# Patient Record
Sex: Male | Born: 1945 | Race: Black or African American | Hispanic: No | Marital: Married | Smoking: Current some day smoker
Health system: Southern US, Community
[De-identification: ages and names within clinical notes are randomized; demographics above are authoritative.]

## PROBLEM LIST (undated history)

## (undated) DIAGNOSIS — E119 Type 2 diabetes mellitus without complications: Secondary | ICD-10-CM

## (undated) DIAGNOSIS — E66811 Obesity, class 1: Secondary | ICD-10-CM

## (undated) DIAGNOSIS — I509 Heart failure, unspecified: Secondary | ICD-10-CM

## (undated) DIAGNOSIS — K5900 Constipation, unspecified: Secondary | ICD-10-CM

## (undated) DIAGNOSIS — G4730 Sleep apnea, unspecified: Secondary | ICD-10-CM

## (undated) DIAGNOSIS — E669 Obesity, unspecified: Secondary | ICD-10-CM

## (undated) DIAGNOSIS — I350 Nonrheumatic aortic (valve) stenosis: Secondary | ICD-10-CM

## (undated) DIAGNOSIS — Z720 Tobacco use: Secondary | ICD-10-CM

## (undated) DIAGNOSIS — K635 Polyp of colon: Secondary | ICD-10-CM

## (undated) DIAGNOSIS — M109 Gout, unspecified: Secondary | ICD-10-CM

## (undated) DIAGNOSIS — R7989 Other specified abnormal findings of blood chemistry: Secondary | ICD-10-CM

## (undated) DIAGNOSIS — N184 Chronic kidney disease, stage 4 (severe): Secondary | ICD-10-CM

## (undated) DIAGNOSIS — I5042 Chronic combined systolic (congestive) and diastolic (congestive) heart failure: Secondary | ICD-10-CM

## (undated) DIAGNOSIS — I10 Essential (primary) hypertension: Secondary | ICD-10-CM

## (undated) DIAGNOSIS — E213 Hyperparathyroidism, unspecified: Secondary | ICD-10-CM

## (undated) DIAGNOSIS — Z95810 Presence of automatic (implantable) cardiac defibrillator: Secondary | ICD-10-CM

## (undated) DIAGNOSIS — M1990 Unspecified osteoarthritis, unspecified site: Secondary | ICD-10-CM

## (undated) DIAGNOSIS — N400 Enlarged prostate without lower urinary tract symptoms: Secondary | ICD-10-CM

## (undated) DIAGNOSIS — E785 Hyperlipidemia, unspecified: Secondary | ICD-10-CM

## (undated) DIAGNOSIS — A048 Other specified bacterial intestinal infections: Secondary | ICD-10-CM

## (undated) DIAGNOSIS — I428 Other cardiomyopathies: Secondary | ICD-10-CM

## (undated) DIAGNOSIS — IMO0001 Reserved for inherently not codable concepts without codable children: Secondary | ICD-10-CM

## (undated) DIAGNOSIS — D126 Benign neoplasm of colon, unspecified: Secondary | ICD-10-CM

## (undated) DIAGNOSIS — N529 Male erectile dysfunction, unspecified: Secondary | ICD-10-CM

## (undated) HISTORY — DX: Obesity, class 1: E66.811

## (undated) HISTORY — DX: Type 2 diabetes mellitus without complications: E11.9

## (undated) HISTORY — DX: Benign prostatic hyperplasia without lower urinary tract symptoms: N40.0

## (undated) HISTORY — DX: Obesity, unspecified: E66.9

## (undated) HISTORY — DX: Polyp of colon: K63.5

## (undated) HISTORY — DX: Hyperparathyroidism, unspecified: E21.3

## (undated) HISTORY — DX: Male erectile dysfunction, unspecified: N52.9

## (undated) HISTORY — DX: Hyperlipidemia, unspecified: E78.5

## (undated) HISTORY — DX: Tobacco use: Z72.0

## (undated) HISTORY — DX: Benign neoplasm of colon, unspecified: D12.6

## (undated) HISTORY — DX: Other specified bacterial intestinal infections: A04.8

## (undated) HISTORY — PX: CARDIAC CATHETERIZATION: SHX172

## (undated) HISTORY — DX: Sleep apnea, unspecified: G47.30

## (undated) HISTORY — DX: Chronic kidney disease, stage 4 (severe): N18.4

## (undated) HISTORY — PX: RETINAL LASER PROCEDURE: SHX2339

## (undated) HISTORY — DX: Other specified abnormal findings of blood chemistry: R79.89

## (undated) HISTORY — PX: LYMPH NODE BIOPSY: SHX201

## (undated) HISTORY — DX: Essential (primary) hypertension: I10

---

## 1998-08-24 ENCOUNTER — Encounter: Payer: Self-pay | Admitting: Emergency Medicine

## 1998-08-24 ENCOUNTER — Inpatient Hospital Stay (HOSPITAL_COMMUNITY): Admission: EM | Admit: 1998-08-24 | Discharge: 1998-08-26 | Payer: Self-pay | Admitting: Emergency Medicine

## 2001-01-01 ENCOUNTER — Ambulatory Visit (HOSPITAL_COMMUNITY): Admission: RE | Admit: 2001-01-01 | Discharge: 2001-01-01 | Payer: Self-pay | Admitting: Internal Medicine

## 2001-07-29 ENCOUNTER — Emergency Department (HOSPITAL_COMMUNITY): Admission: EM | Admit: 2001-07-29 | Discharge: 2001-07-29 | Payer: Self-pay | Admitting: Emergency Medicine

## 2001-07-31 ENCOUNTER — Encounter: Payer: Self-pay | Admitting: Internal Medicine

## 2001-07-31 ENCOUNTER — Ambulatory Visit (HOSPITAL_COMMUNITY): Admission: RE | Admit: 2001-07-31 | Discharge: 2001-07-31 | Payer: Self-pay | Admitting: Internal Medicine

## 2004-01-18 DIAGNOSIS — D126 Benign neoplasm of colon, unspecified: Secondary | ICD-10-CM

## 2004-01-18 HISTORY — DX: Benign neoplasm of colon, unspecified: D12.6

## 2004-08-23 ENCOUNTER — Encounter: Payer: Self-pay | Admitting: Internal Medicine

## 2004-08-23 ENCOUNTER — Ambulatory Visit: Payer: Self-pay | Admitting: Internal Medicine

## 2004-08-23 ENCOUNTER — Ambulatory Visit (HOSPITAL_COMMUNITY): Admission: RE | Admit: 2004-08-23 | Discharge: 2004-08-23 | Payer: Self-pay | Admitting: Internal Medicine

## 2004-08-23 HISTORY — PX: COLONOSCOPY: SHX174

## 2004-08-23 LAB — HM COLONOSCOPY

## 2007-02-23 ENCOUNTER — Ambulatory Visit: Payer: Self-pay | Admitting: Internal Medicine

## 2007-02-23 DIAGNOSIS — E11319 Type 2 diabetes mellitus with unspecified diabetic retinopathy without macular edema: Secondary | ICD-10-CM | POA: Insufficient documentation

## 2007-02-23 DIAGNOSIS — I10 Essential (primary) hypertension: Secondary | ICD-10-CM | POA: Insufficient documentation

## 2007-02-23 DIAGNOSIS — F17200 Nicotine dependence, unspecified, uncomplicated: Secondary | ICD-10-CM | POA: Insufficient documentation

## 2007-02-24 ENCOUNTER — Encounter (INDEPENDENT_AMBULATORY_CARE_PROVIDER_SITE_OTHER): Payer: Self-pay | Admitting: Internal Medicine

## 2007-02-25 LAB — CONVERTED CEMR LAB
ALT: 14 units/L (ref 0–53)
BUN: 23 mg/dL (ref 6–23)
Basophils Relative: 1 % (ref 0–1)
Calcium: 9.7 mg/dL (ref 8.4–10.5)
Eosinophils Relative: 4 % (ref 0–5)
HCT: 40.7 % (ref 39.0–52.0)
MCHC: 33.4 g/dL (ref 30.0–36.0)
MCV: 89.3 fL (ref 78.0–100.0)
Neutro Abs: 4.4 10*3/uL (ref 1.7–7.7)
Potassium: 3.4 meq/L — ABNORMAL LOW (ref 3.5–5.3)
RBC: 4.56 M/uL (ref 4.22–5.81)
RDW: 14 % (ref 11.5–15.5)
Sodium: 140 meq/L (ref 135–145)
Testosterone: 328.11 ng/dL — ABNORMAL LOW (ref 350–890)
Total Protein: 7.1 g/dL (ref 6.0–8.3)
Triglycerides: 67 mg/dL (ref <150)

## 2007-02-26 ENCOUNTER — Telehealth (INDEPENDENT_AMBULATORY_CARE_PROVIDER_SITE_OTHER): Payer: Self-pay | Admitting: *Deleted

## 2007-02-26 ENCOUNTER — Encounter (INDEPENDENT_AMBULATORY_CARE_PROVIDER_SITE_OTHER): Payer: Self-pay | Admitting: Internal Medicine

## 2007-02-27 ENCOUNTER — Encounter (INDEPENDENT_AMBULATORY_CARE_PROVIDER_SITE_OTHER): Payer: Self-pay | Admitting: Internal Medicine

## 2007-02-28 ENCOUNTER — Ambulatory Visit: Payer: Self-pay | Admitting: Internal Medicine

## 2007-04-20 ENCOUNTER — Encounter (INDEPENDENT_AMBULATORY_CARE_PROVIDER_SITE_OTHER): Payer: Self-pay | Admitting: Internal Medicine

## 2007-04-20 ENCOUNTER — Ambulatory Visit: Payer: Self-pay | Admitting: Family Medicine

## 2007-04-20 LAB — CONVERTED CEMR LAB
Blood Glucose, Fingerstick: 130
Hgb A1c MFr Bld: 6.4 %

## 2007-04-30 LAB — CONVERTED CEMR LAB
Creatinine, Urine: 201.9 mg/dL
Microalb Creat Ratio: 117.9 mg/g — ABNORMAL HIGH (ref 0.0–30.0)
Microalb, Ur: 23.8 mg/dL — ABNORMAL HIGH (ref 0.00–1.89)

## 2007-05-28 ENCOUNTER — Ambulatory Visit: Payer: Self-pay | Admitting: Internal Medicine

## 2007-07-23 ENCOUNTER — Ambulatory Visit: Payer: Self-pay | Admitting: Internal Medicine

## 2007-07-25 ENCOUNTER — Telehealth (INDEPENDENT_AMBULATORY_CARE_PROVIDER_SITE_OTHER): Payer: Self-pay | Admitting: *Deleted

## 2007-07-26 ENCOUNTER — Encounter (INDEPENDENT_AMBULATORY_CARE_PROVIDER_SITE_OTHER): Payer: Self-pay | Admitting: Internal Medicine

## 2007-08-17 ENCOUNTER — Ambulatory Visit: Payer: Self-pay | Admitting: Internal Medicine

## 2007-08-20 LAB — CONVERTED CEMR LAB
ALT: 11 units/L (ref 0–53)
AST: 11 units/L (ref 0–37)
Alkaline Phosphatase: 62 units/L (ref 39–117)
BUN: 31 mg/dL — ABNORMAL HIGH (ref 6–23)
Basophils Absolute: 0.1 10*3/uL (ref 0.0–0.1)
Basophils Relative: 1 % (ref 0–1)
CO2: 19 meq/L (ref 19–32)
Creatinine, Ser: 2.2 mg/dL — ABNORMAL HIGH (ref 0.40–1.50)
Eosinophils Relative: 4 % (ref 0–5)
HCT: 40.5 % (ref 39.0–52.0)
Lymphocytes Relative: 27 % (ref 12–46)
Lymphs Abs: 2.1 10*3/uL (ref 0.7–4.0)
MCV: 90.4 fL (ref 78.0–100.0)
Neutro Abs: 4.6 10*3/uL (ref 1.7–7.7)
PSA: 0.41 ng/mL (ref 0.10–4.00)
Platelets: 252 10*3/uL (ref 150–400)
RDW: 13.7 % (ref 11.5–15.5)
Total Protein: 7.1 g/dL (ref 6.0–8.3)
VLDL: 17 mg/dL (ref 0–40)

## 2007-08-22 ENCOUNTER — Encounter (INDEPENDENT_AMBULATORY_CARE_PROVIDER_SITE_OTHER): Payer: Self-pay | Admitting: Internal Medicine

## 2007-08-24 LAB — CONVERTED CEMR LAB
CO2: 22 meq/L (ref 19–32)
Calcium: 9.1 mg/dL (ref 8.4–10.5)
Chloride: 104 meq/L (ref 96–112)
Glucose, Bld: 170 mg/dL — ABNORMAL HIGH (ref 70–99)
Sodium: 142 meq/L (ref 135–145)

## 2007-08-29 ENCOUNTER — Telehealth (INDEPENDENT_AMBULATORY_CARE_PROVIDER_SITE_OTHER): Payer: Self-pay | Admitting: Internal Medicine

## 2007-08-29 ENCOUNTER — Ambulatory Visit (HOSPITAL_COMMUNITY): Admission: RE | Admit: 2007-08-29 | Discharge: 2007-08-29 | Payer: Self-pay | Admitting: Internal Medicine

## 2007-09-06 ENCOUNTER — Ambulatory Visit: Payer: Self-pay | Admitting: Internal Medicine

## 2007-09-13 ENCOUNTER — Telehealth (INDEPENDENT_AMBULATORY_CARE_PROVIDER_SITE_OTHER): Payer: Self-pay | Admitting: Internal Medicine

## 2007-09-18 ENCOUNTER — Encounter (INDEPENDENT_AMBULATORY_CARE_PROVIDER_SITE_OTHER): Payer: Self-pay | Admitting: Internal Medicine

## 2007-10-04 ENCOUNTER — Ambulatory Visit: Payer: Self-pay | Admitting: Internal Medicine

## 2007-10-05 ENCOUNTER — Telehealth (INDEPENDENT_AMBULATORY_CARE_PROVIDER_SITE_OTHER): Payer: Self-pay | Admitting: *Deleted

## 2007-10-05 ENCOUNTER — Encounter (INDEPENDENT_AMBULATORY_CARE_PROVIDER_SITE_OTHER): Payer: Self-pay | Admitting: Internal Medicine

## 2007-10-09 ENCOUNTER — Encounter (INDEPENDENT_AMBULATORY_CARE_PROVIDER_SITE_OTHER): Payer: Self-pay | Admitting: Internal Medicine

## 2007-10-12 LAB — CONVERTED CEMR LAB
PSA: 0.37 ng/mL (ref 0.10–4.00)
Potassium: 3.8 meq/L (ref 3.5–5.3)

## 2007-10-15 ENCOUNTER — Ambulatory Visit: Payer: Self-pay | Admitting: Internal Medicine

## 2007-10-19 LAB — CONVERTED CEMR LAB
Chloride: 101 meq/L (ref 96–112)
Potassium: 3.8 meq/L (ref 3.5–5.3)

## 2007-11-01 ENCOUNTER — Encounter (INDEPENDENT_AMBULATORY_CARE_PROVIDER_SITE_OTHER): Payer: Self-pay | Admitting: Internal Medicine

## 2007-11-20 ENCOUNTER — Ambulatory Visit: Payer: Self-pay | Admitting: Internal Medicine

## 2007-11-27 ENCOUNTER — Encounter (INDEPENDENT_AMBULATORY_CARE_PROVIDER_SITE_OTHER): Payer: Self-pay | Admitting: Internal Medicine

## 2007-12-05 ENCOUNTER — Telehealth (INDEPENDENT_AMBULATORY_CARE_PROVIDER_SITE_OTHER): Payer: Self-pay | Admitting: *Deleted

## 2007-12-06 LAB — CONVERTED CEMR LAB
Albumin: 4 g/dL (ref 3.5–5.2)
BUN: 28 mg/dL — ABNORMAL HIGH (ref 6–23)
CO2: 21 meq/L (ref 19–32)
Calcium: 9.4 mg/dL (ref 8.4–10.5)
Chloride: 103 meq/L (ref 96–112)
Creatinine, Ser: 2.05 mg/dL — ABNORMAL HIGH (ref 0.40–1.50)
Phosphorus: 3.4 mg/dL (ref 2.3–4.6)
Potassium: 4.2 meq/L (ref 3.5–5.3)
Sodium: 141 meq/L (ref 135–145)

## 2007-12-28 ENCOUNTER — Ambulatory Visit: Payer: Self-pay | Admitting: Internal Medicine

## 2008-01-03 ENCOUNTER — Encounter (INDEPENDENT_AMBULATORY_CARE_PROVIDER_SITE_OTHER): Payer: Self-pay | Admitting: Internal Medicine

## 2008-01-18 DIAGNOSIS — G4730 Sleep apnea, unspecified: Secondary | ICD-10-CM

## 2008-01-18 HISTORY — DX: Sleep apnea, unspecified: G47.30

## 2008-01-22 ENCOUNTER — Encounter: Payer: Self-pay | Admitting: Family Medicine

## 2008-01-29 ENCOUNTER — Encounter (INDEPENDENT_AMBULATORY_CARE_PROVIDER_SITE_OTHER): Payer: Self-pay | Admitting: Internal Medicine

## 2008-01-30 ENCOUNTER — Telehealth (INDEPENDENT_AMBULATORY_CARE_PROVIDER_SITE_OTHER): Payer: Self-pay | Admitting: *Deleted

## 2008-02-04 ENCOUNTER — Encounter (INDEPENDENT_AMBULATORY_CARE_PROVIDER_SITE_OTHER): Payer: Self-pay | Admitting: Internal Medicine

## 2008-02-11 ENCOUNTER — Encounter (INDEPENDENT_AMBULATORY_CARE_PROVIDER_SITE_OTHER): Payer: Self-pay | Admitting: Internal Medicine

## 2008-02-18 ENCOUNTER — Telehealth (INDEPENDENT_AMBULATORY_CARE_PROVIDER_SITE_OTHER): Payer: Self-pay | Admitting: *Deleted

## 2008-02-18 ENCOUNTER — Ambulatory Visit: Payer: Self-pay | Admitting: Internal Medicine

## 2008-02-18 DIAGNOSIS — R609 Edema, unspecified: Secondary | ICD-10-CM

## 2008-03-26 ENCOUNTER — Encounter (INDEPENDENT_AMBULATORY_CARE_PROVIDER_SITE_OTHER): Payer: Self-pay | Admitting: Internal Medicine

## 2008-06-02 ENCOUNTER — Telehealth (INDEPENDENT_AMBULATORY_CARE_PROVIDER_SITE_OTHER): Payer: Self-pay | Admitting: *Deleted

## 2008-06-04 ENCOUNTER — Encounter (INDEPENDENT_AMBULATORY_CARE_PROVIDER_SITE_OTHER): Payer: Self-pay | Admitting: Internal Medicine

## 2008-06-13 ENCOUNTER — Encounter (INDEPENDENT_AMBULATORY_CARE_PROVIDER_SITE_OTHER): Payer: Self-pay | Admitting: *Deleted

## 2008-07-10 ENCOUNTER — Telehealth (INDEPENDENT_AMBULATORY_CARE_PROVIDER_SITE_OTHER): Payer: Self-pay | Admitting: Internal Medicine

## 2008-07-29 ENCOUNTER — Encounter: Payer: Self-pay | Admitting: Family Medicine

## 2008-09-15 ENCOUNTER — Ambulatory Visit: Payer: Self-pay | Admitting: Family Medicine

## 2008-09-15 DIAGNOSIS — E1169 Type 2 diabetes mellitus with other specified complication: Secondary | ICD-10-CM

## 2008-09-26 ENCOUNTER — Ambulatory Visit: Payer: Self-pay | Admitting: Family Medicine

## 2008-09-26 ENCOUNTER — Ambulatory Visit (HOSPITAL_COMMUNITY): Admission: RE | Admit: 2008-09-26 | Discharge: 2008-09-26 | Payer: Self-pay | Admitting: Family Medicine

## 2008-09-28 ENCOUNTER — Emergency Department (HOSPITAL_COMMUNITY): Admission: EM | Admit: 2008-09-28 | Discharge: 2008-09-29 | Payer: Self-pay | Admitting: Emergency Medicine

## 2008-10-01 ENCOUNTER — Encounter: Payer: Self-pay | Admitting: Family Medicine

## 2008-10-01 ENCOUNTER — Inpatient Hospital Stay (HOSPITAL_COMMUNITY): Admission: EM | Admit: 2008-10-01 | Discharge: 2008-10-03 | Payer: Self-pay | Admitting: Emergency Medicine

## 2008-10-01 ENCOUNTER — Ambulatory Visit: Payer: Self-pay | Admitting: Cardiology

## 2008-10-02 ENCOUNTER — Encounter (INDEPENDENT_AMBULATORY_CARE_PROVIDER_SITE_OTHER): Payer: Self-pay | Admitting: Internal Medicine

## 2008-10-07 ENCOUNTER — Telehealth: Payer: Self-pay | Admitting: Family Medicine

## 2008-10-14 ENCOUNTER — Telehealth: Payer: Self-pay | Admitting: Family Medicine

## 2008-10-15 ENCOUNTER — Telehealth: Payer: Self-pay | Admitting: Family Medicine

## 2008-10-20 ENCOUNTER — Ambulatory Visit: Payer: Self-pay | Admitting: Family Medicine

## 2008-10-21 ENCOUNTER — Encounter: Payer: Self-pay | Admitting: Family Medicine

## 2008-10-24 ENCOUNTER — Encounter (HOSPITAL_COMMUNITY): Admission: RE | Admit: 2008-10-24 | Discharge: 2008-11-23 | Payer: Self-pay | Admitting: Cardiology

## 2008-10-24 ENCOUNTER — Ambulatory Visit: Payer: Self-pay | Admitting: Cardiovascular Disease

## 2008-10-27 ENCOUNTER — Telehealth: Payer: Self-pay | Admitting: Family Medicine

## 2008-10-31 ENCOUNTER — Encounter: Payer: Self-pay | Admitting: Family Medicine

## 2008-11-05 ENCOUNTER — Encounter: Payer: Self-pay | Admitting: Family Medicine

## 2008-11-06 ENCOUNTER — Ambulatory Visit: Payer: Self-pay | Admitting: Cardiology

## 2008-11-06 ENCOUNTER — Encounter (INDEPENDENT_AMBULATORY_CARE_PROVIDER_SITE_OTHER): Payer: Self-pay | Admitting: *Deleted

## 2008-11-10 ENCOUNTER — Emergency Department (HOSPITAL_COMMUNITY): Admission: EM | Admit: 2008-11-10 | Discharge: 2008-11-10 | Payer: Self-pay | Admitting: Emergency Medicine

## 2008-11-10 ENCOUNTER — Encounter: Payer: Self-pay | Admitting: Cardiology

## 2008-11-11 ENCOUNTER — Inpatient Hospital Stay (HOSPITAL_COMMUNITY): Admission: EM | Admit: 2008-11-11 | Discharge: 2008-11-12 | Payer: Self-pay | Admitting: Emergency Medicine

## 2008-11-14 ENCOUNTER — Encounter: Payer: Self-pay | Admitting: Cardiology

## 2008-11-18 ENCOUNTER — Inpatient Hospital Stay (HOSPITAL_COMMUNITY): Admission: EM | Admit: 2008-11-18 | Discharge: 2008-11-19 | Payer: Self-pay | Admitting: Emergency Medicine

## 2008-11-18 ENCOUNTER — Ambulatory Visit: Payer: Self-pay | Admitting: Cardiology

## 2008-11-24 ENCOUNTER — Ambulatory Visit: Payer: Self-pay | Admitting: Family Medicine

## 2008-11-24 LAB — CONVERTED CEMR LAB: Glucose, Bld: 156 mg/dL

## 2008-11-27 ENCOUNTER — Ambulatory Visit: Payer: Self-pay | Admitting: Cardiology

## 2008-11-28 ENCOUNTER — Encounter (INDEPENDENT_AMBULATORY_CARE_PROVIDER_SITE_OTHER): Payer: Self-pay

## 2008-11-28 ENCOUNTER — Encounter (INDEPENDENT_AMBULATORY_CARE_PROVIDER_SITE_OTHER): Payer: Self-pay | Admitting: *Deleted

## 2008-11-28 LAB — CONVERTED CEMR LAB
ALT: 21 units/L
ALT: 21 units/L
Albumin: 3.9 g/dL
Albumin: 3.9 g/dL
Alkaline Phosphatase: 73 units/L
Calcium: 8.8 mg/dL
Calcium: 8.8 mg/dL
Creatinine, Ser: 2.01 mg/dL
Glucose, Bld: 208 mg/dL
Glucose, Bld: 208 mg/dL
Hgb A1c MFr Bld: 8.1 %
Hgb A1c MFr Bld: 8.1 %
Potassium: 4 meq/L

## 2008-12-01 ENCOUNTER — Encounter (INDEPENDENT_AMBULATORY_CARE_PROVIDER_SITE_OTHER): Payer: Self-pay

## 2008-12-02 ENCOUNTER — Encounter: Payer: Self-pay | Admitting: Family Medicine

## 2009-01-22 ENCOUNTER — Ambulatory Visit: Payer: Self-pay | Admitting: Family Medicine

## 2009-01-26 ENCOUNTER — Telehealth: Payer: Self-pay | Admitting: Family Medicine

## 2009-01-29 ENCOUNTER — Encounter: Payer: Self-pay | Admitting: Family Medicine

## 2009-01-30 ENCOUNTER — Encounter: Payer: Self-pay | Admitting: Family Medicine

## 2009-02-18 ENCOUNTER — Encounter (INDEPENDENT_AMBULATORY_CARE_PROVIDER_SITE_OTHER): Payer: Self-pay | Admitting: *Deleted

## 2009-03-02 ENCOUNTER — Ambulatory Visit: Payer: Self-pay | Admitting: Cardiology

## 2009-03-02 DIAGNOSIS — Z87898 Personal history of other specified conditions: Secondary | ICD-10-CM

## 2009-03-03 ENCOUNTER — Encounter (INDEPENDENT_AMBULATORY_CARE_PROVIDER_SITE_OTHER): Payer: Self-pay

## 2009-03-20 ENCOUNTER — Encounter: Payer: Self-pay | Admitting: Family Medicine

## 2009-03-20 ENCOUNTER — Ambulatory Visit: Admission: RE | Admit: 2009-03-20 | Discharge: 2009-03-20 | Payer: Self-pay | Admitting: Cardiology

## 2009-03-26 ENCOUNTER — Ambulatory Visit: Payer: Self-pay | Admitting: Family Medicine

## 2009-03-26 LAB — CONVERTED CEMR LAB: Blood Glucose, Fasting: 121 mg/dL

## 2009-03-26 LAB — HM DIABETES FOOT EXAM

## 2009-03-27 ENCOUNTER — Telehealth: Payer: Self-pay | Admitting: Family Medicine

## 2009-03-30 ENCOUNTER — Encounter: Payer: Self-pay | Admitting: Family Medicine

## 2009-03-30 ENCOUNTER — Encounter: Payer: Self-pay | Admitting: Cardiology

## 2009-03-30 ENCOUNTER — Encounter (INDEPENDENT_AMBULATORY_CARE_PROVIDER_SITE_OTHER): Payer: Self-pay | Admitting: *Deleted

## 2009-04-02 ENCOUNTER — Encounter: Payer: Self-pay | Admitting: Cardiology

## 2009-04-02 LAB — CONVERTED CEMR LAB
BUN: 35 mg/dL — ABNORMAL HIGH (ref 6–23)
Chloride: 103 meq/L (ref 96–112)
Glucose, Bld: 79 mg/dL (ref 70–99)
Potassium: 3.6 meq/L (ref 3.5–5.3)

## 2009-04-03 LAB — CONVERTED CEMR LAB
AST: 15 units/L (ref 0–37)
Albumin: 4.3 g/dL (ref 3.5–5.2)
BUN: 65 mg/dL — ABNORMAL HIGH (ref 6–23)
Basophils Absolute: 0 10*3/uL (ref 0.0–0.1)
Basophils Relative: 1 % (ref 0–1)
Calcium: 9.6 mg/dL (ref 8.4–10.5)
Chloride: 99 meq/L (ref 96–112)
Creatinine, Ser: 3.02 mg/dL — ABNORMAL HIGH (ref 0.40–1.50)
Eosinophils Relative: 8 % — ABNORMAL HIGH (ref 0–5)
Glucose, Bld: 117 mg/dL — ABNORMAL HIGH (ref 70–99)
HDL: 58 mg/dL (ref >39)
Hemoglobin: 12.8 g/dL — ABNORMAL LOW (ref 13.0–17.0)
Indirect Bilirubin: 0.4 mg/dL (ref 0.0–0.9)
MCHC: 33.7 g/dL (ref 30.0–36.0)
MCV: 88.4 fL (ref 78.0–100.0)
Monocytes Absolute: 0.6 10*3/uL (ref 0.1–1.0)
Monocytes Relative: 9 % (ref 3–12)
Neutrophils Relative %: 50 % (ref 43–77)
PSA: 0.83 ng/mL (ref 0.10–4.00)
Platelets: 230 10*3/uL (ref 150–400)
Potassium: 3.1 meq/L — ABNORMAL LOW (ref 3.5–5.3)
RBC: 4.3 M/uL (ref 4.22–5.81)
Total Protein: 6.5 g/dL (ref 6.0–8.3)

## 2009-04-07 ENCOUNTER — Encounter: Payer: Self-pay | Admitting: Family Medicine

## 2009-04-20 LAB — CONVERTED CEMR LAB
CO2: 24 meq/L (ref 19–32)
Calcium: 9.3 mg/dL (ref 8.4–10.5)
Chloride: 102 meq/L (ref 96–112)
Creatinine, Ser: 2.28 mg/dL — ABNORMAL HIGH (ref 0.40–1.50)
Potassium: 3.9 meq/L (ref 3.5–5.3)

## 2009-04-21 ENCOUNTER — Encounter (INDEPENDENT_AMBULATORY_CARE_PROVIDER_SITE_OTHER): Payer: Self-pay | Admitting: *Deleted

## 2009-04-28 ENCOUNTER — Encounter: Payer: Self-pay | Admitting: Family Medicine

## 2009-04-29 ENCOUNTER — Ambulatory Visit: Payer: Self-pay | Admitting: Family Medicine

## 2009-04-29 DIAGNOSIS — J309 Allergic rhinitis, unspecified: Secondary | ICD-10-CM | POA: Insufficient documentation

## 2009-05-06 ENCOUNTER — Encounter: Payer: Self-pay | Admitting: Family Medicine

## 2009-05-06 ENCOUNTER — Telehealth (INDEPENDENT_AMBULATORY_CARE_PROVIDER_SITE_OTHER): Payer: Self-pay | Admitting: *Deleted

## 2009-05-12 ENCOUNTER — Encounter: Payer: Self-pay | Admitting: Family Medicine

## 2009-06-29 ENCOUNTER — Ambulatory Visit: Payer: Self-pay | Admitting: Family Medicine

## 2009-06-29 DIAGNOSIS — F329 Major depressive disorder, single episode, unspecified: Secondary | ICD-10-CM

## 2009-07-06 ENCOUNTER — Telehealth: Payer: Self-pay | Admitting: Family Medicine

## 2009-09-03 ENCOUNTER — Encounter: Payer: Self-pay | Admitting: Family Medicine

## 2009-09-08 ENCOUNTER — Ambulatory Visit: Payer: Self-pay | Admitting: Family Medicine

## 2009-09-10 ENCOUNTER — Encounter: Payer: Self-pay | Admitting: Family Medicine

## 2009-09-24 ENCOUNTER — Encounter (INDEPENDENT_AMBULATORY_CARE_PROVIDER_SITE_OTHER): Payer: Self-pay | Admitting: *Deleted

## 2009-10-16 ENCOUNTER — Ambulatory Visit: Payer: Self-pay | Admitting: Cardiology

## 2009-10-16 ENCOUNTER — Encounter: Payer: Self-pay | Admitting: Adult Health

## 2009-10-19 ENCOUNTER — Encounter (INDEPENDENT_AMBULATORY_CARE_PROVIDER_SITE_OTHER): Payer: Self-pay | Admitting: *Deleted

## 2009-10-19 ENCOUNTER — Ambulatory Visit: Payer: Self-pay | Admitting: Family Medicine

## 2009-10-19 LAB — CONVERTED CEMR LAB
BUN: 32 mg/dL
CO2: 26 meq/L
Calcium: 9.3 mg/dL
Calcium: 9.3 mg/dL (ref 8.4–10.5)
Chloride: 102 meq/L (ref 96–112)
Creatinine, Ser: 2.51 mg/dL
Potassium: 4 meq/L (ref 3.5–5.3)
Sodium: 139 meq/L (ref 135–145)

## 2009-10-22 ENCOUNTER — Ambulatory Visit: Payer: Self-pay | Admitting: Cardiology

## 2009-12-01 ENCOUNTER — Encounter: Payer: Self-pay | Admitting: Family Medicine

## 2009-12-02 ENCOUNTER — Ambulatory Visit: Payer: Self-pay | Admitting: Cardiology

## 2009-12-03 ENCOUNTER — Ambulatory Visit: Payer: Self-pay | Admitting: Gastroenterology

## 2009-12-03 ENCOUNTER — Encounter: Payer: Self-pay | Admitting: Internal Medicine

## 2009-12-09 ENCOUNTER — Ambulatory Visit: Payer: Self-pay | Admitting: Cardiology

## 2009-12-14 ENCOUNTER — Encounter: Payer: Self-pay | Admitting: Family Medicine

## 2009-12-14 ENCOUNTER — Encounter (INDEPENDENT_AMBULATORY_CARE_PROVIDER_SITE_OTHER): Payer: Self-pay | Admitting: *Deleted

## 2009-12-14 LAB — CONVERTED CEMR LAB
Albumin: 3.8 g/dL
BUN: 24 mg/dL
CO2: 30 meq/L
Calcium: 9.1 mg/dL
Chloride: 101 meq/L
Creatinine, Ser: 2.25 mg/dL
Glucose, Bld: 92 mg/dL
Hgb A1c MFr Bld: 6.4 %

## 2009-12-15 ENCOUNTER — Encounter (INDEPENDENT_AMBULATORY_CARE_PROVIDER_SITE_OTHER): Payer: Self-pay

## 2009-12-15 ENCOUNTER — Encounter: Payer: Self-pay | Admitting: Family Medicine

## 2009-12-15 LAB — CONVERTED CEMR LAB
Albumin: 3.8 g/dL
Alkaline Phosphatase: 90 units/L
CO2: 30 meq/L
Calcium: 9.1 mg/dL
Chloride: 101 meq/L
Creatinine, Ser: 2.25 mg/dL
Glucose, Bld: 92 mg/dL
Potassium: 4 meq/L
Total Protein: 6.5 g/dL

## 2009-12-17 DIAGNOSIS — N184 Chronic kidney disease, stage 4 (severe): Secondary | ICD-10-CM

## 2009-12-17 HISTORY — DX: Chronic kidney disease, stage 4 (severe): N18.4

## 2009-12-18 ENCOUNTER — Ambulatory Visit: Payer: Self-pay | Admitting: Family Medicine

## 2009-12-21 ENCOUNTER — Ambulatory Visit (HOSPITAL_COMMUNITY)
Admission: RE | Admit: 2009-12-21 | Discharge: 2009-12-21 | Payer: Self-pay | Source: Home / Self Care | Admitting: Internal Medicine

## 2009-12-28 ENCOUNTER — Encounter: Payer: Self-pay | Admitting: Family Medicine

## 2009-12-31 ENCOUNTER — Ambulatory Visit (HOSPITAL_COMMUNITY): Payer: Self-pay | Admitting: Psychology

## 2009-12-31 ENCOUNTER — Encounter: Payer: Self-pay | Admitting: Family Medicine

## 2010-01-04 ENCOUNTER — Encounter: Payer: Self-pay | Admitting: Family Medicine

## 2010-01-04 ENCOUNTER — Encounter (INDEPENDENT_AMBULATORY_CARE_PROVIDER_SITE_OTHER): Payer: Self-pay | Admitting: *Deleted

## 2010-01-05 ENCOUNTER — Encounter (INDEPENDENT_AMBULATORY_CARE_PROVIDER_SITE_OTHER): Payer: Self-pay | Admitting: *Deleted

## 2010-01-07 ENCOUNTER — Ambulatory Visit: Payer: Self-pay | Admitting: Cardiology

## 2010-01-14 ENCOUNTER — Encounter (INDEPENDENT_AMBULATORY_CARE_PROVIDER_SITE_OTHER): Payer: Self-pay | Admitting: *Deleted

## 2010-01-14 DIAGNOSIS — G4730 Sleep apnea, unspecified: Secondary | ICD-10-CM | POA: Insufficient documentation

## 2010-01-19 ENCOUNTER — Encounter: Payer: Self-pay | Admitting: Internal Medicine

## 2010-01-19 ENCOUNTER — Other Ambulatory Visit: Payer: Self-pay | Admitting: Cardiology

## 2010-01-19 ENCOUNTER — Ambulatory Visit (HOSPITAL_COMMUNITY)
Admission: RE | Admit: 2010-01-19 | Discharge: 2010-01-19 | Payer: Self-pay | Source: Home / Self Care | Attending: Cardiology | Admitting: Cardiology

## 2010-01-28 ENCOUNTER — Encounter: Payer: Self-pay | Admitting: Family Medicine

## 2010-02-07 ENCOUNTER — Encounter: Payer: Self-pay | Admitting: Internal Medicine

## 2010-02-08 ENCOUNTER — Telehealth: Payer: Self-pay | Admitting: Family Medicine

## 2010-02-10 ENCOUNTER — Encounter: Payer: Self-pay | Admitting: Family Medicine

## 2010-02-14 LAB — CONVERTED CEMR LAB
ALT: 12 units/L (ref 0–53)
AST: 12 units/L (ref 0–37)
Alkaline Phosphatase: 82 units/L (ref 39–117)
BUN: 42 mg/dL — ABNORMAL HIGH (ref 6–23)
Calcium: 9.6 mg/dL (ref 8.4–10.5)
Chloride: 105 meq/L (ref 96–112)
Creatinine, Ser: 2.43 mg/dL — ABNORMAL HIGH (ref 0.40–1.50)
Glucose, Bld: 149 mg/dL
Pro B Natriuretic peptide (BNP): 1728.3 pg/mL — ABNORMAL HIGH (ref 0.0–100.0)
Total Bilirubin: 0.4 mg/dL (ref 0.3–1.2)

## 2010-02-15 ENCOUNTER — Ambulatory Visit
Admission: RE | Admit: 2010-02-15 | Discharge: 2010-02-15 | Payer: Self-pay | Source: Home / Self Care | Attending: Cardiology | Admitting: Cardiology

## 2010-02-16 ENCOUNTER — Encounter: Payer: Self-pay | Admitting: Cardiology

## 2010-02-18 ENCOUNTER — Ambulatory Visit: Admit: 2010-02-18 | Payer: Self-pay | Admitting: Family Medicine

## 2010-02-18 ENCOUNTER — Ambulatory Visit: Payer: Self-pay | Admitting: Family Medicine

## 2010-02-18 NOTE — Progress Notes (Signed)
  Faxed Labs over to [facility] to fax [phone] [facility]  May 06, 2009 8:32 AM

## 2010-02-18 NOTE — Assessment & Plan Note (Signed)
Summary: 1 wk nurse visit per checkout on 12/02/09/tg  Nurse Visit   Vital Signs:  Patient profile:   65 year old male Weight:      242 pounds O2 Sat:      98 % on Room air Pulse rate:   66 / minute BP sitting:   151 / 61  (left arm)  Vitals Entered By: Larita Fife Via LPN (December 09, 2009 3:51 PM)  O2 Flow:  Room air  Current Medications (verified): 1)  Aspirin 81 Mg  Tbec (Aspirin) .Marland Kitchen.. 1 By Mouth Once Daily 2)  Torsemide 20 Mg Tabs (Torsemide) .... Take 4 Tablets By Mouth Two Times A Day 3)  Catapres-Tts-2 0.2 Mg/24hr Ptwk (Clonidine Hcl) .... Apply One Patch Every 7 Days 4)  Labetalol Hcl 300 Mg Tabs (Labetalol Hcl) .... Take 2 Tabs Three Times A Day 5)  Diovan 160 Mg Tabs (Valsartan) .... Take 1 Tablet By Mouth Two Times A Day 6)  Klor-Con M20 20 Meq Cr-Tabs (Potassium Chloride Crys Cr) .... Take 1 Tablet By Mouth Two Times A Day 7)  Amlodipine Besylate 10 Mg Tabs (Amlodipine Besylate) .... Take 1 Tab Daily 8)  Lantus Solostar 100 Unit/ml Soln (Insulin Glargine) .... Inject20  Units Subcutaneously Pm 9)  Novolog 100 Unit/ml Soln (Insulin Aspart) .... Sliding Scale 10)  Calcitriol 0.25 Mcg Caps (Calcitriol) .... Take 1 Tab Daily 11)  Colace 100 Mg Caps (Docusate Sodium) .Marland Kitchen.. 1 By Mouth Two Times A Day 12)  Miralax  Powd (Polyethylene Glycol 3350) .Marland Kitchen.. 1 Capful Daily As Needed For Constipation  Allergies (verified): 1)  Ace Inhibitors  Referring Provider:  Dr. Syliva Overman Primary Provider:  Dr. Syliva Overman   History of Present Illness: S: Pt. returns to office for a 1 week BP and weight check/nurse visit. B: On last OV with Joni Reining, NP on 11-16 pt. was advised to stop taking Lasix and start taking Torsemide 80mg  two times a day, weigh daily and start an exercise program for 30 mins. sustained everyday.  A: Pt. c/o SOB with exertion and slight swelling in feet and ankles. BP today is 151/61; weight=242 and O2=98% (no vital signs in last OV notes to compare).  Vitals from 10-6 OV was:  BP=147/52 and weight=247. Pt. states he has lab work due for his PCP on Monday and wants to wait until then to have BMET drawn. R: We will contact pt. with Joni Reining, NP's recommendations, if any.  No further recommendations until after lab work. Keep exercising!  Joni Reining NP

## 2010-02-18 NOTE — Miscellaneous (Signed)
Summary: labs cmp,a1c,12/15/2009 morayati  Clinical Lists Changes  Observations: Added new observation of CALCIUM: 9.1 mg/dL (54/09/8117 14:78) Added new observation of ALBUMIN: 3.8 g/dL (29/56/2130 86:57) Added new observation of PROTEIN, TOT: 6.5 g/dL (84/69/6295 28:41) Added new observation of SGPT (ALT): 19 units/L (12/14/2009 10:08) Added new observation of SGOT (AST): 20 units/L (12/14/2009 10:08) Added new observation of ALK PHOS: 90 units/L (12/14/2009 10:08) Added new observation of CREATININE: 2.25 mg/dL (32/44/0102 72:53) Added new observation of BUN: 24 mg/dL (66/44/0347 42:59) Added new observation of BG RANDOM: 92 mg/dL (56/38/7564 33:29) Added new observation of CO2 PLSM/SER: 30 meq/L (12/14/2009 10:08) Added new observation of CL SERUM: 101 meq/L (12/14/2009 10:08) Added new observation of K SERUM: 4.0 meq/L (12/14/2009 10:08) Added new observation of NA: 140 meq/L (12/14/2009 10:08) Added new observation of HGBA1C: 6.4 % (12/14/2009 10:08)

## 2010-02-18 NOTE — Assessment & Plan Note (Signed)
Summary: F UP   Vital Signs:  Patient profile:   65 year old male Height:      70 inches Weight:      230 pounds BMI:     33.12 O2 Sat:      94 % Pulse rate:   78 / minute Pulse rhythm:   regular Resp:     16 per minute BP sitting:   92 / 60  (left arm) Cuff size:   large  Vitals Entered By: Everitt Amber LPN (March 26, 2009 8:54 AM)  Nutrition Counseling: [name] BMI is greater than 25 and therefore counseled on weight management options. CC: Follow up chronic problems, was told that his clonidine patches were $10 but that wasn't the case when he got to the pharamcy and the clonidine pills makes his mouth very dry   Primary Care Provider:  DR.MARGARET SIMPSON  CC:  Follow up chronic problems and was told that his clonidine patches were $10 but that wasn't the case when he got to the pharamcy and the clonidine pills makes his mouth very dry.  History of Present Illness: Pt concerned about poor sugar control, uses sliding scale short acting insulin and a long acting lantus 12 units.States  his sugars are never within range as  he has had in the past and is concerned about this. Reports  that he is otherwise generally doing well.  Denies recent fever or chills.Denies sinus pressure, nasal congestion , ear pain or sore throat. Denies chest congestion, or cough productive of sputum. Denies chest pain, palpitations, PND, orthopnea or leg swelling. Denies abdominal pain, nausea, vomitting, diarrhea or constipation. Denies change in bowel movements or bloody stool. Denies dysuria , frequency, incontinence or hesitancy. Denies  joint pain, swelling, or reduced mobility. Denies headaches, vertigo, seizures. 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 Pt is still concerned about a lesion on left 2nd toe space which is sometimes painful   Current Medications (verified): 1)  Aspirin 81 Mg  Tbec (Aspirin) .Marland Kitchen.. 1  By Mouth Once Daily 2)  Furosemide 80 Mg Tabs (Furosemide) .... Take 2 Tablet By Mouth Two Times A Day 3)  Catapres-Tts-2 0.2 Mg/24hr Ptwk (Clonidine Hcl) .... Apply One Patch Every 7 Days 4)  Labetalol Hcl 300 Mg Tabs (Labetalol Hcl) .... One and One Half Tabs By Mouth Bid 5)  Diovan 320 Mg Tabs (Valsartan) .... Take One Tablet By Mouth Daily 6)  Klor-Con M20 20 Meq Cr-Tabs (Potassium Chloride Crys Cr) .... Take One Tablet By Mouth Daily 7)  Amlodipine Besylate 10 Mg Tabs (Amlodipine Besylate) .... Take 1 Tab Daily 8)  Lantus Solostar 100 Unit/ml Soln (Insulin Glargine) .... Inject 12 Units Subcutaneously Once Daily 9)  Novolog 100 Unit/ml Soln (Insulin Aspart) .... Sliding Scale  Allergies (verified): 1)  Ace Inhibitors  Review of Systems      See HPI Eyes:  Denies blurring and discharge. MS:  Complains of joint pain. Psych:  Denies anxiety and depression. Endo:  Complains of excessive thirst and excessive urination; pt reports that blood sugars remain elevated and are seldom under 200, and often over 300. He denies hyupoglycemic episodes, he again requests and endo nearer to [place], he does have a [place] appt next week which he intends to keep. Heme:  Denies abnormal bruising and bleeding. Allergy:  Denies hives or rash and itching eyes.  Physical Exam  General:  Well-developedobese,in no acute distress; alert,appropriate  and cooperative throughout examinationIll appearing. HEENT: No facial asymmetry,  EOMI, No sinus tenderness, TM's Clear, oropharynx  pink and moist. erythema and edma of nasal mucosa  Chest: adequate air entry, no crackles or wheezes CVS: S1, S2, No murmurs, No S3.   Abd: Soft, Nontender.  MS: decreased  ROM spine,adequate in  hips, shoulders and knees.  Ext: No edema.   CNS: CN 2-12 intact, power tone and sensation normal throughout.   Skin: Intact, callous on left 2nd toe  Psych: Good eye contact, normal affect.  Memory intact, not anxious or depressed  appearing.   Diabetes Management Exam:    Foot Exam (with socks and/or shoes not present):       Sensory-Monofilament:          Left foot: diminished          Right foot: diminished       Inspection:          Left foot: normal          Right foot: normal       Nails:          Left foot: normal          Right foot: normal   Impression & Recommendations:  Problem # 1:  HYPERTENSION (ICD-401.9) Assessment Comment Only  His updated medication list for this problem includes:    Furosemide 80 Mg Tabs (Furosemide) .Marland Kitchen... Take 2 tablet by mouth two times a day    Catapres-tts-2 0.2 Mg/24hr Ptwk (Clonidine hcl) .Marland Kitchen... Apply one patch every 7 days    Labetalol Hcl 300 Mg Tabs (Labetalol hcl) ..... One and one half tabs by mouth bid    Diovan 320 Mg Tabs (Valsartan) .Marland Kitchen... Take one tablet by mouth daily    Amlodipine Besylate 10 Mg Tabs (Amlodipine besylate) .Marland Kitchen... Take 1 tab daily  BP today: 92/60 Prior BP: 134/56 (03/02/2009)  Prior 10 Yr Risk Heart Disease: 27 % (10/24/2008)  Labs Reviewed: K+: 4.0 (11/28/2008) Creat: : 2.01 (11/28/2008)   Chol: 175 (08/17/2007)   HDL: 63 (08/17/2007)   LDL: 95 (08/17/2007)   TG: 84 (08/17/2007)  Problem # 2:  ACUTE DIASTOLIC HEART FAILURE (ICD-428.31) Assessment: Improved  His updated medication list for this problem includes:    Aspirin 81 Mg Tbec (Aspirin) .Marland Kitchen... 1 by mouth once daily    Furosemide 80 Mg Tabs (Furosemide) .Marland Kitchen... Take 2 tablet by mouth two times a day    Labetalol Hcl 300 Mg Tabs (Labetalol hcl) ..... One and one half tabs by mouth bid    Diovan 320 Mg Tabs (Valsartan) .Marland Kitchen... Take one tablet by mouth daily  Problem # 3:  TOBACCO ABUSE (ICD-305.1) Assessment: Unchanged  Encouraged smoking cessation and discussed different methods for smoking cessation.   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 1/2 tab once daily    Prandin 2 Mg Tabs (Repaglinide) .Marland Kitchen... Take 1 tablet by mouth three times a day His updated medication list for this problem includes:    Aspirin 81 Mg Tbec (Aspirin) .Marland Kitchen... 1 by mouth once daily    Diovan 320 Mg Tabs (Valsartan) .Marland Kitchen... Take one tablet by mouth daily    Lantus Solostar 100 Unit/ml Soln (Insulin glargine) ..... Inject 12 units subcutaneously once daily    Novolog 100 Unit/ml Soln (Insulin aspart) ..... Sliding scale  Orders:  Glucose, (CBG) [phone]) T- Hemoglobin A1C [phone]) Podiatry Referral (Podiatry)  Labs Reviewed: Creat: 2.01 (11/28/2008)    Reviewed HgBA1c results: 8.1 (11/28/2008)  8.1 (11/28/2008)  Problem # 5:  DIABETIC FOOT ULCER, TOE (ICD-250.80)  Complete Medication List: 1)  Aspirin 81 Mg Tbec (Aspirin) .Marland Kitchen.. 1 by mouth once daily 2)  Furosemide 80 Mg Tabs (Furosemide) .... Take 2 tablet by mouth two times a day 3)  Catapres-tts-2 0.2 Mg/24hr Ptwk (Clonidine hcl) .... Apply one patch every 7 days 4)  Labetalol Hcl 300 Mg Tabs (Labetalol hcl) .... One and one half tabs by mouth bid 5)  Diovan 320 Mg Tabs (Valsartan) .... Take one tablet by mouth daily 6)  Klor-con M20 20 Meq Cr-tabs (Potassium chloride crys cr) .... Take one tablet by mouth daily 7)  Amlodipine Besylate 10 Mg Tabs (Amlodipine besylate) .... Take 1 tab daily 8)  Lantus Solostar 100 Unit/ml Soln (Insulin glargine) .... Inject 12 units subcutaneously once daily 9)  Novolog 100 Unit/ml Soln (Insulin aspart) .... Sliding scale  Other Orders: T-Hepatic Function [phone]) T-Lipid Profile [phone]) T-Basic Metabolic Panel [phone]) T-PSA [phone]) T-CBC w/Diff [id number])  Patient Instructions: 1)  Please schedule a follow-up appointment in 3 months. 2)  HbgA1C prior to visit, ICD-9: 3)  Hepatic Panel prior to visit, ICD-9: 4)  Lipid Panel prior to visit, ICD-9: 5)  BMP prior to visit, ICD-9:      fasting labs  today 6)  PSA prior to visit, ICD-9: 7)  CBC w/ Diff prior to visit, ICD-9: 8)  You will be referred to a podiatrist in 2 to 3 weeks  Laboratory Results   Blood Tests     Glucose (fasting): 121 mg/dL   (Normal Range: 25-956)

## 2010-02-18 NOTE — Letter (Signed)
[facility]   Imported By: Lind Guest 12/16/2009 16:44:45  _____________________________________________________________________  External Attachment:    Type:   Image     Comment:   External Document

## 2010-02-18 NOTE — Letter (Signed)
Summary: Work Excuse  [facility]  [address]   [place], [place] [address]   Phone: [phone]  Fax: [phone]    Today's Date: September 08, 2009  Name of Patient: [name]  The above named patient had a medical visit today. Please take this into consideration when reviewing the time away from work/school.    Special Instructions:  [ * ] None  [  ] To be off the remainder of today, returning to the normal work / school schedule tomorrow.  [  ] To be off until the next scheduled appointment on ______________________.  [  ] Other ________________________________________________________________ ________________________________________________________________________   Sincerely yours,   Syliva Overman, MD

## 2010-02-18 NOTE — Progress Notes (Signed)
Summary: [facility]  [facility]   Imported By: Lind Guest 05/19/2009 09:22:15  _____________________________________________________________________  External Attachment:    Type:   Image     Comment:   External Document

## 2010-02-18 NOTE — Letter (Signed)
Summary: [facility] STATUS REPORT  [facility]   Imported By: Lind Guest 12/04/2009 09:57:27  _____________________________________________________________________  External Attachment:    Type:   Image     Comment:   External Document

## 2010-02-18 NOTE — Progress Notes (Signed)
[facility]: [facility]   Imported By: Lind Guest 04/10/2009 13:48:54  _____________________________________________________________________  External Attachment:    Type:   Image     Comment:   External Document

## 2010-02-18 NOTE — Letter (Signed)
Summary: [facility] [employer] [facility], agricultural at [facility]  [address], [place] [address]   Phone: [phone]  Fax: [phone]      April 21, 2009 MRN: [id number]   [name] [address] [place], [place]  [address]   Dear Mr. SCHWINN,  Your test ordered by Selena Batten has been reviewed by your physician (or physician assistant) and was found to be normal or stable. Your physician (or physician assistant) felt no changes were needed at this time.  ____ Echocardiogram  ____ Cardiac Stress Test  __x_ Lab Work  ____ Peripheral vascular study of arms, legs or neck  ____ CT scan or X-ray  ____ Lung or Breathing test  ____ Other:  No change in medical treatment at this time, per Dr. Dietrich Pates.  Enclosed is a copy of your labwork for your records.  Thank you, Micco Bourbeau Allyne Gee RN    Turlock Bing, MD, Lenise Arena.C.Gaylord Shih, MD, F.A.C.C Lewayne Bunting, MD, F.A.C.C Nona Dell, MD, F.A.C.C Charlton Haws, MD, Lenise Arena.C.C

## 2010-02-18 NOTE — Assessment & Plan Note (Signed)
Summary: sore throat - room 1   Vital Signs:  Patient profile:   65 year old male Height:      70 inches Weight:      237.25 pounds BMI:     34.16 O2 Sat:      98 % on Room air Pulse rate:   76 / minute Resp:     16 per minute BP sitting:   120 / 40  (left arm)  Vitals Entered By: Adella Hare LPN (April 29, 2009 10:27 AM)  Nutrition Counseling: [name] BMI is greater than 25 and therefore counseled on weight management options. CC: sore throat and hoarse Is Patient Diabetic? Yes Did you bring your meter with you today? No Pain Assessment Patient in pain? no        Primary Provider:  Dr. Syliva Overman  CC:  sore throat and hoarse.  History of Present Illness: Pt presents today with c/o hoarse voice, & little bit of scratchy sore throat x about 1 wk.  He has been getting some sharp pains in his Rt ear off & on last couple of days too.  Also admits to clear nasal drainage, and some coughing primarily when lies down.  No difficulty breathing. No fever or chills. Also has been sneezing.  Pt requests a prescription to get a pair of diabetic shoes. Is concerned about his wt gain since on insulin.  States he has gained about 10 # in the last 3 mos.  He walks for exercise, and feels that he eats pretty healthy.  Does drink Pepsi.  Did have an episode of hypoglycemia last wk.  EMS was called.  He was unconscious at a gas station but improved quickly with IV, then orange juice.  Pt admits that he took his Novolog insulin in the morning, didnt feel like eating so didn't have anything.  Then worked in the yard for [employer]. Started to not feel well so drove to the pharmacy for test strips for his glucose meter.  Then decided to stop at the gas station for gas on the way home.  Pt states EMS told him his blood sugar was 20.  Current Medications (verified): 1)  Aspirin 81 Mg  Tbec (Aspirin) .Marland Kitchen.. 1 By Mouth Once Daily 2)  Furosemide 80 Mg Tabs (Furosemide) .... Take 1 Tablet By Mouth Two  Times A Day 3)  Catapres-Tts-2 0.2 Mg/24hr Ptwk (Clonidine Hcl) .... Apply One Patch Every 7 Days 4)  Labetalol Hcl 300 Mg Tabs (Labetalol Hcl) .... One and One Half Tabs By Mouth Bid 5)  Diovan 320 Mg Tabs (Valsartan) .... Take One Tablet By Mouth Daily 6)  Klor-Con M20 20 Meq Cr-Tabs (Potassium Chloride Crys Cr) .... Take 1 Tablet By Mouth Two Times A Day 7)  Amlodipine Besylate 10 Mg Tabs (Amlodipine Besylate) .... Take 1 Tab Daily 8)  Lantus Solostar 100 Unit/ml Soln (Insulin Glargine) .... Inject 12 Units Subcutaneously Once Daily 9)  Novolog 100 Unit/ml Soln (Insulin Aspart) .... Sliding Scale 10)  Simvastatin 20 Mg Tabs (Simvastatin) .... One Tab By Mouth At Bedtime  Allergies (verified): 1)  Ace Inhibitors  Past History:  Past medical history reviewed for relevance to current acute and chronic problems.  Past Medical History: Reviewed history from 11/06/2008 and no changes required. Congestive heart failure with preserved LV systolic function-10/2008 Diabetes mellitus, type II--with retinopathy Hypertension Erectile dysfunction colonic polyp hypogonadism  Review of Systems General:  Denies chills and fever. ENT:  Complains of earache, hoarseness, nasal congestion,  postnasal drainage, and sore throat; denies sinus pressure; RT EAR INTERMITTENT PAIN. CV:  Denies chest pain or discomfort. Resp:  Complains of cough; denies shortness of breath; COUGH MOSTLY HS WHEN LYING DOWN. Allergy:  Complains of itching eyes and sneezing; denies seasonal allergies.  Physical Exam  General:  Well-developed,well-nourished,in no acute distress; alert,appropriate and cooperative throughout examination Eyes:  No corneal or conjunctival inflammation noted. EOMI. Perrla. Funduscopic exam benign, without hemorrhages, exudates or papilledema. Vision grossly normal. Ears:  External ear exam shows no significant lesions or deformities.  Otoscopic examination reveals clear canals, tympanic membranes  are intact bilaterally without bulging, retraction, inflammation or discharge. Hearing is grossly normal bilaterally. Nose:  no external deformity.  Nasal turbs mod swollen & pale.  + TTP bilat maxillary sinuses. Mouth:  Oral mucosa and oropharynx without lesions or exudates. Neck:  No deformities, masses, or tenderness noted. Lungs:  Normal respiratory effort, chest expands symmetrically. Lungs are clear to auscultation, no crackles or wheezes. Heart:  Normal rate and regular rhythm. S1 and S2 normal without gallop, murmur, click, rub or other extra sounds. Cervical Nodes:  No lymphadenopathy noted Psych:  Cognition and judgment appear intact. Alert and cooperative with normal attention span and concentration. No apparent delusions, illusions, hallucinations   Impression & Recommendations:  Problem # 1:  ALLERGIC RHINITIS (ICD-477.9) Assessment New  His updated medication list for this problem includes:    Fexofenadine Hcl 180 Mg Tabs (Fexofenadine hcl) .Marland Kitchen... Take 1 daily for allergies  Problem # 2:  SINUSITIS, MAXILLARY, ACUTE (ICD-461.0) Assessment: New  His updated medication list for this problem includes:    Amoxicillin 500 Mg Caps (Amoxicillin) .Marland Kitchen... 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 Also encouraged pt to increase his exercise, d/c Pepsi or change to diet, and to tighten up diet a little bit more to try to help with wt gain since starting insulin therapy. Discussed the importance of eating something even if he doesn't feel like it after he takes his Novolog. His updated medication list for this problem includes:    Aspirin 81 Mg Tbec (Aspirin) .Marland Kitchen... 1 by mouth once daily    Diovan 320 Mg Tabs (Valsartan) .Marland Kitchen... Take one tablet by mouth daily    Lantus Solostar 100 Unit/ml Soln (Insulin glargine) ..... Inject 12 units subcutaneously once daily     Novolog 100 Unit/ml Soln (Insulin aspart) ..... Sliding scale  Complete Medication List: 1)  Aspirin 81 Mg Tbec (Aspirin) .Marland Kitchen.. 1 by mouth once daily 2)  Furosemide 80 Mg Tabs (Furosemide) .... Take 1 tablet by mouth two times a day 3)  Catapres-tts-2 0.2 Mg/24hr Ptwk (Clonidine hcl) .... Apply one patch every 7 days 4)  Labetalol Hcl 300 Mg Tabs (Labetalol hcl) .... One and one half tabs by mouth bid 5)  Diovan 320 Mg Tabs (Valsartan) .... Take one tablet by mouth daily 6)  Klor-con M20 20 Meq Cr-tabs (Potassium chloride crys cr) .... Take 1 tablet by mouth two times a day 7)  Amlodipine Besylate 10 Mg Tabs (Amlodipine besylate) .... Take 1 tab daily 8)  Lantus Solostar 100 Unit/ml Soln (Insulin glargine) .... Inject 12 units subcutaneously once daily 9)  Novolog 100 Unit/ml Soln (Insulin aspart) .... Sliding scale 10)  Simvastatin 20 Mg Tabs (Simvastatin) .... One tab by mouth at bedtime 11)  Fexofenadine Hcl 180 Mg Tabs (Fexofenadine hcl) .... Take 1  daily for allergies 12)  Amoxicillin 500 Mg Caps (Amoxicillin) .... Take 1 three times a day x 10 days  Patient Instructions: 1)  Keep your next appt with Dr Lodema Hong.  We will see you sooner if needed. 2)  It is important that you exercise regularly at least 20 minutes 5 times a week. If you develop chest pain, have severe difficulty breathing, or feel very tired , stop exercising immediately and seek medical attention. 3)  You need to lose weight. Consider a lower calorie diet and regular exercise.  4)  Get plenty of rest, drink lots of clear liquids, and use Tylenol or Ibuprofen for fever and comfort. Return in 7-10 days if you're not better:sooner if you're feeling worse. 5)  I have prescribed an antihistamine for your allergies, and Amoxicillin for an antibiotics. 6)  I have prescribed diabetic shoes per your request. Prescriptions: AMOXICILLIN 500 MG CAPS (AMOXICILLIN) take 1 three times a day x 10 days  #30 x 0   Entered and Authorized  by:   Esperanza Sheets PA   Signed by:   Esperanza Sheets PA on 04/29/2009   Method used:   [facility]ed to ...       [facility]       [address]       [place], [place]  [address]       Ph: [phone] or [phone]       Fax: [phone]   RxID:   [id number] FEXOFENADINE HCL 180 MG TABS (FEXOFENADINE HCL) take 1 daily for allergies  #30 x 0   Entered and Authorized by:   Esperanza Sheets PA   Signed by:   Esperanza Sheets PA on 04/29/2009   Method used:   Faxed to ...       [facility]retail[facility]       [address]       [place], [place]  [address]       Ph: [phone] or [phone]       Fax: [phone]   RxID:   [id number]

## 2010-02-18 NOTE — Letter (Signed)
[facility]   Imported By: Lind Guest 12/31/2009 14:43:17  _____________________________________________________________________  External Attachment:    Type:   Image     Comment:   External Document

## 2010-02-18 NOTE — Progress Notes (Signed)
Summary: referral for diietician  Phone Note Call from Patient   Summary of Call: pt would like to get a dietician referral. please give him a call [phone] Initial call taken by: Lind Guest,  February 08, 2010 11:59 AM  Follow-up for Phone Call        [facility] to refer to [place] [facility]? Follow-up by: Everitt Amber LPN,  February 08, 2010 2:04 PM  Additional Follow-up for Phone Call Additional follow up Details #1::        yes, pls do and let him know, refer to [facility], not certain if new dietician is also Victorino Dike Additional Follow-up by: Syliva Overman MD,  February 08, 2010 5:32 PM    Additional Follow-up for Phone Call Additional follow up Details #2::    Referral sent to [facility]  Follow-up by: Everitt Amber LPN,  February 09, 2010 4:27 PM

## 2010-02-18 NOTE — Miscellaneous (Signed)
Summary: labs bmp10/03/2009  Clinical Lists Changes  Observations: Added new observation of CALCIUM: 9.3 mg/dL (16/10/9602 54:09) Added new observation of CREATININE: 2.51 mg/dL (81/19/1478 29:56) Added new observation of BUN: 32 mg/dL (21/30/8657 84:69) Added new observation of BG RANDOM: 174 mg/dL (62/95/2841 32:44) Added new observation of CO2 PLSM/SER: 26 meq/L (10/19/2009 10:11) Added new observation of CL SERUM: 102 meq/L (10/19/2009 10:11) Added new observation of K SERUM: 4.0 meq/L (10/19/2009 10:11) Added new observation of NA: 139 meq/L (10/19/2009 10:11)

## 2010-02-18 NOTE — Assessment & Plan Note (Signed)
Summary: 1 mth f/u per checkout on 11/16.11.tg   Visit Type:  Follow-up Referring Provider:  Neurology-Dr. Gerilyn Pilgrim Primary Provider:  Dr. Syliva Overman   History of Present Illness: [name] returns to the office following a recent evaluation for a 10 pound weight gain in the absence of additional evidence for congestive heart failure.  BNP level was not measured.  Chronic kidney disease has been stable with creatinines in the low twos.   Current Medications (verified): 1)  Aspirin 81 Mg  Tbec (Aspirin) .Marland Kitchen.. 1 By Mouth Once Daily 2)  Torsemide 100 Mg Tabs (Torsemide) .... Take One Tablet By Mouth Daily. 3)  Catapres-Tts-2 0.2 Mg/24hr Ptwk (Clonidine Hcl) .... Apply One Patch Every 7 Days 4)  Labetalol Hcl 300 Mg Tabs (Labetalol Hcl) .... Take 2 Tabs Three Times A Day 5)  Diovan 320 Mg Tabs (Valsartan) .... Take 1 Tablet By Mouth Once A Day ' 6)  Klor-Con M20 20 Meq Cr-Tabs (Potassium Chloride Crys Cr) .... Take 1 Tablet By Mouth Two Times A Day 7)  Amlodipine Besylate 10 Mg Tabs (Amlodipine Besylate) .... Take 1 Tab Daily 8)  Lantus Solostar 100 Unit/ml Soln (Insulin Glargine) .... Inject20  Units Subcutaneously Pm 9)  Novolog 100 Unit/ml Soln (Insulin Aspart) .... Sliding Scale 10)  Calcitriol 0.25 Mcg Caps (Calcitriol) .... Take 1 Tab Daily 11)  Colace 100 Mg Caps (Docusate Sodium) .Marland Kitchen.. 1 By Mouth Two Times A Day 12)  Miralax  Powd (Polyethylene Glycol 3350) .Marland Kitchen.. 1 Capful Daily As Needed For Constipation 13)  Penicillin V Potassium 500 Mg Tabs (Penicillin V Potassium) .... Take 1 Tablet By Mouth Three Times A Day 14)  Fluoxetine Hcl 10 Mg Caps (Fluoxetine Hcl) .... Take 1 Capsule By Mouth Once A Day 15)  Terazosin Hcl 10 Mg Caps (Terazosin Hcl) .... Take 1/2 Tablet X1week Then Increase To 1 Tablet Daily  Allergies (verified): 1)  Ace Inhibitors  Comments:  Nurse/Medical Assistant: patient didn't bring meds or list patient uses [facility] we reviewed meds with  patient  Past History:  PMH, FH, and Social History reviewed and updated.  Past Medical History: Congestive heart failure with preserved LV systolic function-10/2008 Aortic insufficiency: Normal left ventricular size and function in 09/2008; graded as mild by Doppler,       but pulse pressure is widened Diabetes mellitus, type II--with retinopathy and nephropathy Hypertension Chronic kidney disease-stage IV: creatinine  2.2 in 12/2009 Sleep apnea-evaluated by Dr. Gerilyn Pilgrim, but unable to afford CPAP Erectile dysfunction colonic polyp hypogonadism  Review of Systems       The patient complains of weight gain.  The patient denies hoarseness, chest pain, syncope, dyspnea on exertion, peripheral edema, prolonged cough, and abdominal pain.    Vital Signs:  Patient profile:   65 year old male Weight:      246 pounds BMI:     35.42 O2 Sat:      98 % on Room air Pulse rate:   76 / minute BP sitting:   187 / 69  (right arm)  Vitals Entered By: Dreama Saa, CNA (January 14, 2010 2:38 PM)  O2 Flow:  Room air  Physical Exam  General:  Overweight; well developed; no acute distress:   Neck-No JVD; bilateral early systolic carotid bruits vs. transmitted murmur; normal carotid upstroke: Lungs-No tachypnea, no rales; no rhonchi; no wheezes: Cardiovascular-normal PMI; normal S1 and S2: grade 3/6 early systolic ejection murmur and a holodiastolic murmur at the left sternal border; no peripheral  manifestations of hemodynamically significant AI other than a pulse pressure of 120. Abdomen-BS normal; soft and non-tender without masses or organomegaly:  Musculoskeletal-No deformities, no cyanosis or clubbing: Neurologic-Normal cranial nerves; symmetric strength and tone:  Skin-Warm, no significant lesions: Extremities-Nl distal pulses; no edema:     Impression & Recommendations:  Problem # 1:  AORTIC INSUFFICIENCY-MILD (ICD-424.1) Patient continues to have impressive murmurs of aortic  valve disease, but no stenosis and only mild insufficiency by echocardiographic criteria.  Echocardiogram will be repeated to reassess severity of AI, LV size and LV function.   Problem # 2:  CONGESTIVE HEART FAILURE-NORMAL EF (ICD-428.0) Patient is compensated at the present time.  His regimen of Demadex is inefficient and expensive.  We will substitute 100 mg tablets at an initiale dose of one tablet q.d.  If weight increases, he will add 50 mg q.p.m.  He has had frequent nocturia and will move the p.m. dose to early afternoon, if it is necessary.  I've recommended a low-salt diet for him.  He has been instructed of this in the past, but is eating sausage and other items high in sodium.  Problem # 3:  HYPERTENSION (ICD-401.9) Systolic blood pressure is elevated, possibly due to a high pulse pressure related to his aortic insufficiency.  Since he would benefit from a lower blood pressure, Hytrin will be added to his medical regime and dose-titrated.  Other Orders: 2-D Echocardiogram (2D Echo) Future Orders: T-Basic Metabolic Panel [id number]) ... 02/25/2010 T-Comprehensive Metabolic Panel [id number]) ... 01/28/2010  Patient Instructions: 1)  Your physician recommends that you schedule a follow-up appointment in: 2 months 2)  Your physician recommends that you return for lab work in:3 & 6 weeks 3)  Your physician has recommended you make the following change in your medication: change torsemide to 100mg  daily if wt increases > 5 lbs add 1/2 tablet at 1 pm until wt back to baseline, hytrin( terazosin) 10mg   take 1/2 tablet by mouth dialy x 1week then increase to a  whole tablet daily 4)  You have been referred to nurse visit in 1 month please bring bp diary to nurse visit 5)  Your physician has requested that you regularly monitor and record your blood pressure readings at home.  Please use the same machine at the same time of day to check your readings and record them to bring to your  follow-up visit. 6)  Your physician has requested that you have an echocardiogram.  Echocardiography is a painless test that uses sound waves to create images of your heart. It provides your doctor with information about the size and shape of your heart and how well your heart's chambers and valves are working.  This procedure takes approximately one hour. There are no restrictions for this procedure. Prescriptions: TERAZOSIN HCL 10 MG CAPS (TERAZOSIN HCL) take 1/2 tablet x1week then increase to 1 tablet daily  #30 x 3   Entered by:   Teressa Lower RN   Authorized by:   Kathlen Brunswick, MD, [facility]   Signed by:   Teressa Lower RN on 01/14/2010   Method used:   Faxed to ...       [facility]retail[facility]       [address]       [place], [place]  [address]       Ph: [phone] or [phone]       Fax: [phone]   RxID:  [id number] DIOVAN 320 MG TABS (VALSARTAN) Take 1 tablet by mouth once a day '  #30 x 3   Entered by:   Teressa Lower RN   Authorized by:   Kathlen Brunswick, MD, [facility]   Signed by:   Teressa Lower RN on 01/14/2010   Method used:   Faxed to ...       Hospital doctor (retail)       [address]       [place], [place]  [address]       Ph: [phone] or [phone]       Fax: [phone]   RxID:   [id number] TORSEMIDE 100 MG TABS (TORSEMIDE) Take one tablet by mouth daily.  #30 x 3   Entered by:   Teressa Lower RN   Authorized by:   Kathlen Brunswick, MD, [facility]   Signed by:   Teressa Lower RN on 01/14/2010   Method used:   Faxed to ...       Hospital doctor (retail)       [address]       [place], [place]  [address]       Ph: [phone] or [phone]       Fax: [phone]   RxID:   [id number]

## 2010-02-18 NOTE — Progress Notes (Signed)
Summary: speak with nurse  Phone Note Call from Patient   Summary of Call: pt would like to speak with nurse about having some scan done of being sore all over body. [phone] Initial call taken by: Rudene Anda,  July 06, 2009 3:09 PM  Follow-up for Phone Call        patient was asking if he needs mri, states a lot of his friends his age have had one.  advised patient this is not a routine test, this is a test for more acute problems  Follow-up by: Adella Hare LPN,  July 07, 2009 10:14 AM

## 2010-02-18 NOTE — Medication Information (Signed)
Summary: [employer]   Imported By: Lind Guest 04/29/2009 16:12:43  _____________________________________________________________________  External Attachment:    Type:   Image     Comment:   External Document

## 2010-02-18 NOTE — Letter (Signed)
[facility]   Imported By: Lind Guest 01/04/2010 10:09:11  _____________________________________________________________________  External Attachment:    Type:   Image     Comment:   External Document

## 2010-02-18 NOTE — Progress Notes (Signed)
Summary: [facility]   Imported By: Lind Guest 05/14/2009 10:59:58  _____________________________________________________________________  External Attachment:    Type:   Image     Comment:   External Document

## 2010-02-18 NOTE — Assessment & Plan Note (Signed)
Summary: ROV WEIGHT GAIN   Visit Type:  Follow-up Primary Provider:  Dr. Syliva Overman   History of Present Illness: [name] is a 65 y/o AAM we are seeing after he had gained 10lbs in 3-4 days.  He has been recently seen by Dr. Johny Chess and his insulin has been increased. Since increasing insulin he has noticed weight gain.  He denies dietary noncompliance or medical noncompliance.  He is also followed by [facility] and they are monitoring renal fx.  He has a history of CKD, hypertension, hypercholesterolemia and diabetes. He is complaining of increased shortness of breath sometimes, but no acute symptoms other than weight gain.  He is concerned about the weight gain and is requesting this visit.  Current Medications (verified): 1)  Aspirin 81 Mg  Tbec (Aspirin) .Marland Kitchen.. 1 By Mouth Once Daily 2)  Furosemide 80 Mg Tabs (Furosemide) .... Take2  Tablet By Mouth Two Times A Day 3)  Catapres-Tts-2 0.2 Mg/24hr Ptwk (Clonidine Hcl) .... Apply One Patch Every 7 Days 4)  Labetalol Hcl 300 Mg Tabs (Labetalol Hcl) .... Take 2 Tabs Three Times A Day 5)  Diovan 160 Mg Tabs (Valsartan) .... Take 1 Tablet By Mouth Two Times A Day 6)  Klor-Con M20 20 Meq Cr-Tabs (Potassium Chloride Crys Cr) .... Take 1 Tablet By Mouth Two Times A Day 7)  Amlodipine Besylate 10 Mg Tabs (Amlodipine Besylate) .... Take 1 Tab Daily 8)  Lantus Solostar 100 Unit/ml Soln (Insulin Glargine) .... Inject20  Units Subcutaneously Pm 9)  Novolog 100 Unit/ml Soln (Insulin Aspart) .... Sliding Scale 10)  Pravastatin Sodium 40 Mg Tabs (Pravastatin Sodium) .... Take 1 Tab By Mouth At Bedtime 11)  Calcitriol 0.25 Mcg Caps (Calcitriol) .... Take 1 Tab Daily  Allergies (verified): 1)  Ace Inhibitors  Past History:  Past medical, surgical, family and social histories (including risk factors) reviewed, and no changes noted (except as noted below).  Past Medical History: Reviewed history from 11/06/2008 and no changes  required. Congestive heart failure with preserved LV systolic function-10/2008 Diabetes mellitus, type II--with retinopathy Hypertension Erectile dysfunction colonic polyp hypogonadism  Past Surgical History: Reviewed history from 11/06/2008 and no changes required. Laser therapy for diabetic retinopathy  Family History: Reviewed history from 03/02/2009 and no changes required. father-deceased-57--prostate cancer mother-87--specific cause uncertain Siblings-6; no vascular disease Children-4; no vascular disease  Social History: Reviewed history from 03/02/2009 and no changes required. Married lives with spouse Current Smoker-1ppd x45 years Alcohol use-yes-0-2, less than monthly Drug use-no Semi-etired truck driver  Review of Systems       The patient complains of weight gain.         All other systems have been reviewed and are negative unless stated above.   Vital Signs:  Patient profile:   65 year old male Weight:      246 pounds BMI:     35.42 Pulse rate:   77 / minute BP sitting:   159 / 61  (right arm)  Vitals Entered By: Dreama Saa, CNA (October 16, 2009 1:45 PM)  Physical Exam  General:  Well developed, well nourished, in no acute distress. Head:  normocephalic and atraumatic Eyes:  PERRLA/EOM intact; conjunctiva and lids normal. Lungs:  Clear bilaterally to auscultation and percussion. Heart:  Grade  2/6 SEM loudest primary aortic area -->carotids.  Mild S4 murmur.Grade  /6 SEM loudest primary aortic area -->carotids.   Abdomen:  Obese nontender, no distention Msk:  Back normal, normal gait. Muscle strength and tone  normal. Pulses:  pulses normal in all 4 extremities Extremities:  2+ left pedal edema and 2+ right pedal edema.  2+ left pedal edema.   Neurologic:  Alert and oriented x 3. Psych:  Normal affect.   EKG  Procedure date:  10/16/2009  Findings:      LAE Normal sinus rhythm with rate of 79 bpm :    Impression &  Recommendations:  Problem # 1:  ACUTE DIASTOLIC HEART FAILURE (ICD-428.31) He appears mildly fluid overloaded.  Will increase his lasix to an additional 80mg  at noontime along with current 120mg  two times a day.  He is to do this for 3 days and continue to weigh himself.  He will follow-up after that with a BMET and see me again in 1 week.  He is advised to continue low salt diet. His updated medication list for this problem includes:    Aspirin 81 Mg Tbec (Aspirin) .Marland Kitchen... 1 by mouth once daily    Furosemide 80 Mg Tabs (Furosemide) .Marland Kitchen... Take2  tablet by mouth two times a day    Labetalol Hcl 300 Mg Tabs (Labetalol hcl) .Marland Kitchen... Take 2 tabs three times a day    Diovan 160 Mg Tabs (Valsartan) .Marland Kitchen... Take 1 tablet by mouth two times a day    Amlodipine Besylate 10 Mg Tabs (Amlodipine besylate) .Marland Kitchen... Take 1 tab daily  Problem # 2:  HYPERTENSION (ICD-401.9) BP elevated this visit.  With extra diuresis this should improve.  If not may need to increase his labetolol or diovan dose. Will reassess in 1 week. His updated medication list for this problem includes:    Aspirin 81 Mg Tbec (Aspirin) .Marland Kitchen... 1 by mouth once daily    Furosemide 80 Mg Tabs (Furosemide) .Marland Kitchen... Take2  tablet by mouth two times a day    Catapres-tts-2 0.2 Mg/24hr Ptwk (Clonidine hcl) .Marland Kitchen... Apply one patch every 7 days    Labetalol Hcl 300 Mg Tabs (Labetalol hcl) .Marland Kitchen... Take 2 tabs three times a day    Diovan 160 Mg Tabs (Valsartan) .Marland Kitchen... Take 1 tablet by mouth two times a day    Amlodipine Besylate 10 Mg Tabs (Amlodipine besylate) .Marland Kitchen... Take 1 tab daily  Future Orders: T-Basic Metabolic Panel [id number]) ... 10/19/2009  Problem # 3:  AORTIC INSUFFICIENCY-MILD (ICD-424.1) Will need follow-up echo in 3 months.  Bruit is promiment. His updated medication list for this problem includes:    Furosemide 80 Mg Tabs (Furosemide) .Marland Kitchen... Take2  tablet by mouth two times a day    Labetalol Hcl 300 Mg Tabs (Labetalol hcl) .Marland Kitchen... Take 2 tabs three  times a day    Diovan 160 Mg Tabs (Valsartan) .Marland Kitchen... Take 1 tablet by mouth two times a day  Patient Instructions: 1)  Your physician recommends that you schedule a follow-up appointment in: 1 WEEK 2)  Your physician recommends that you return for lab work in: Monday 3)  Your physician has recommended you make the following change in your medication: take an additional 80mg  dose of Furosemide for 3 days then go back to 160mg  by mouth two times a day

## 2010-02-18 NOTE — Letter (Signed)
Summary: Recall, Screening Colonoscopy Only  [facility]  [address]   [place], [place] [address]   Phone: [phone]  Fax: [phone]    September 24, 2009  [name] [address] [place], [place]  [address] April 18, 1945   Dear [name],   Our records indicate it is time to schedule your colonoscopy.    Please call our office at [phone] and ask for the nurse.   Thank you,    [name], LPN Cloria Spring, LPN  [facility] Ph: [phone]   Fax: [phone]

## 2010-02-18 NOTE — Letter (Signed)
Summary: [facility] ORDER  [facility] ORDER   Imported By: Ave Filter 12/03/2009 10:45:59  _____________________________________________________________________  External Attachment:    Type:   Image     Comment:   External Document

## 2010-02-18 NOTE — Miscellaneous (Signed)
Summary: CMP and HGB A1c  Clinical Lists Changes  Observations: Added new observation of CALCIUM: 9.1 mg/dL (19/14/7829 56:21) Added new observation of ALBUMIN: 3.8 g/dL (30/86/5784 69:62) Added new observation of PROTEIN, TOT: 6.5 g/dL (95/28/4132 44:01) Added new observation of SGPT (ALT): 19 units/L (12/15/2009 13:14) Added new observation of SGOT (AST): 20 units/L (12/15/2009 13:14) Added new observation of ALK PHOS: 90 units/L (12/15/2009 13:14) Added new observation of BILI DIRECT: Bili Total: 0.5 mg/dL (02/72/5366 44:03) Added new observation of CREATININE: 2.25 mg/dL (47/42/5956 38:75) Added new observation of BUN: 24 mg/dL (64/33/2951 88:41) Added new observation of BG RANDOM: 92 mg/dL (66/06/3014 01:09) Added new observation of CO2 PLSM/SER: 30 meq/L (12/15/2009 13:14) Added new observation of CL SERUM: 101 meq/L (12/15/2009 13:14) Added new observation of K SERUM: 4.0 meq/L (12/15/2009 13:14) Added new observation of NA: 140 meq/L (12/15/2009 13:14)

## 2010-02-18 NOTE — Letter (Signed)
Summary: DR.  Patrecia Pace  DR.  MORAYATI   Imported By: Lind Guest 12/29/2009 09:07:55  _____________________________________________________________________  External Attachment:    Type:   Image     Comment:   External Document

## 2010-02-18 NOTE — Assessment & Plan Note (Signed)
Summary: 3 MTH F/U PER CHECKOUT ON 11/26/08/TG   Visit Type:  Follow-up Primary Provider:  DR.MARGARET SIMPSON   History of Present Illness: [name] returns to the office for continued assessment and treatment of valvular heart disease and hypertensive heart disease.  Since his last visit, he has done generally well.  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  He's had no significant edema.  He notes no orthopnea nor PND.  He does have daytime somnolence and a history of snoring, but also takes moderate dose clonidine.  He has not had an erection for 6-12 months and was told of low testosterone in the past.  He has never tried a phosphodiesterase inhibitor and does not care to because of possible adverse effects.   Current Medications (verified): 1)  Aspirin 81 Mg  Tbec (Aspirin) .Marland Kitchen.. 1 By Mouth Once Daily 2)  Furosemide 80 Mg Tabs (Furosemide) .... Take 2 Tablet By Mouth Two Times A Day 3)  Catapres-Tts-2 0.2 Mg/24hr Ptwk (Clonidine Hcl) .... Apply One Patch Every 7 Days 4)  Labetalol Hcl 300 Mg Tabs (Labetalol Hcl) .... One and One Half Tabs By Mouth Bid 5)  Diovan 320 Mg Tabs (Valsartan) .... Take One Tablet By Mouth Daily 6)  Klor-Con M20 20 Meq Cr-Tabs (Potassium Chloride Crys Cr) .... Take One Tablet By Mouth Daily 7)  Amlodipine Besylate 10 Mg Tabs (Amlodipine Besylate) .... Take 1 Tab Daily 8)  Onglyza 5 Mg Tabs (Saxagliptin Hcl) .... 1/2 Tab Once Daily 9)  Prandin 2 Mg Tabs (Repaglinide) .... Take 1 Tablet By Mouth Three Times A Day 10)  Lantus Solostar 100 Unit/ml Soln (Insulin Glargine) .... Inject 12 Units Subcutaneously Once Daily 11)  Novolog 100 Unit/ml Soln (Insulin Aspart) .... Sliding Scale  Allergies (verified): 1)  Ace Inhibitors  Past History:  PMH, FH, and Social History reviewed and updated.  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    Vital Signs:  Patient profile:   65 year old male Weight:      237 pounds BMI:     34.13 BSA:     2.25 Pulse rate:   69 / minute BP sitting:   134 / 56  (right arm)  Physical Exam  General:     Well developed; no acute distress; mildly overweight    Neck-No JVD; no carotid bruits: Lungs-No tachypnea, no rales; no rhonchi; no wheezes: Cardiovascular-normal PMI; normal S1 and S2; grade 3/6 diastolic blowing murmur at the cardiac base and hurt across the precordium;; grade 2/6 systolic ejection murmur at the upper right sternal border. Abdomen-BS normal; soft and non-tender without masses or organomegaly; hepatic edge at the right sternal border; no pulsatility Musculoskeletal-No deformities, no cyanosis or clubbing: Neurologic-Normal cranial nerves; symmetric strength and tone:  Skin-Warm, no significant lesions: Extremities-trace edema; slightly bounding pulses.   Impression & Recommendations:  Problem # 1:  AORTIC INSUFFICIENCY-MILD (ICD-424.1) Physical examination is relatively impressive; however, echocardiogram does not suggest hemodynamically significant aortic insufficiency.  Continuing observation is appropriate with occasional  repeat echocardiogram.  Problem # 2:  TOBACCO ABUSE (ICD-305.1) The patient claims consumption of only a few cigarettes per day.  He is encouraged to chew nicotine gum rather than to smoke those few cigarettes.  Problem # 3:  OBESITY,MILD  (ICD-278.00) He has gained significant weight since starting insulin.  If he is refraining from cigarette smoking, that is also contributing.  If his sleep study is positive, more attention needs to be paid to this problem.  Problem # 4:  SOMNOLENCE-DAYTIME (ICD-780.09) Wife has reported snoring to patient.  A sleep study will be performed.  Clonidine may be contributing and will be changed to Catapres TTS-II.  Problem # 5:  HYPERTENSION (ICD-401.9) Blood pressure control is good.  Current medications will be continued.  Problem # 6:  ERECTILE DYSFUNCTION (ICD-302.72) Treatment with a phosphodiesterase inhibitor was offered to the patient.  He is concerned about side effects, not terribly interested in treatment and will defer for the time being.  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  I will plan to see this nice gentleman again in 8 months.  Other Orders: Sleep Study Other (Sleep Study Other)  Patient Instructions: 1)  Your physician recommends that you schedule a follow-up appointment in: 8 months 2)  Your physician has recommended you make the following change in your medication: change clonidine to TTS-2 patch change every 7 days, nicoticine gum when you want a cigarrette 3)  Your physician has recommended that you have a sleep study.  This test records several body functions during sleep, including:  brain activity, eye movement, oxygen and carbon dioxide blood levels, heart rate and rhythm, breathing rate and rhythm, the flow of air through your mouth and nose, snoring, body muscle movements, and chest and belly movement. 4)  Your physician discussed the hazards of tobacco use.  Tobacco use cessation is recommended and techniques and options to help you quit were discussed. Prescriptions: CATAPRES-TTS-2 0.2 MG/24HR PTWK (CLONIDINE HCL) apply one patch every 7 days  #4 x 6   Entered by:   Teressa Lower RN   Authorized by:    Kathlen Brunswick, MD, [facility]   Signed by:   Teressa Lower RN on 03/02/2009   Method used:   Print then Give to Patient   RxID:   [id number]

## 2010-02-18 NOTE — Miscellaneous (Signed)
Summary: Orders Update  Clinical Lists Changes  Orders: Added new Test order of T-Basic Metabolic Panel [phone]) - Signed Added new Test order of T-Basic Metabolic Panel [phone]) - Signed

## 2010-02-18 NOTE — Progress Notes (Signed)
Summary: [facility]   [facility]   Imported By: Lind Guest 02/06/2009 11:18:06  _____________________________________________________________________  External Attachment:    Type:   Image     Comment:   External Document

## 2010-02-18 NOTE — Letter (Signed)
Summary: FMLA paperwork note  Previously, FMLA paperwork had been left at our office to complete.  After reviewing the medical record, I do not see any indication at this time for [name] to receive FMLA on behalf of her husband in regards to Gastrointestinal issues.  I have left a message on the machine for the patient or his wife to let them know that we would be happy to give an out of work note for the day(s) he had procedure done; however, formal FMLA would need to be provided by the PCP or other specialty if [name] meets their requirements.  I will follow up as necessary; otherwise, pt's wife needs out of work note only.

## 2010-02-18 NOTE — Assessment & Plan Note (Signed)
Summary: office visit   Vital Signs:  Patient profile:   65 year old male Height:      70 inches Weight:      232 pounds BMI:     33.41 O2 Sat:      98 % Pulse rate:   78 / minute Pulse rhythm:   regular Resp:     16 per minute BP sitting:   114 / 48  (left arm) Cuff size:   large  Vitals Entered By: Everitt Amber LPN (June 29, 2009 7:58 AM) CC: Follow up chronic problems   Primary Care Provider:  Dr. Syliva Overman  CC:  Follow up chronic problems.  History of Present Illness: Pt reports that overall he has not been doing well in the past year, alot of the problem being underlying depression which has been undiagnosed and untreated. He has lost interest in his many hobbies.He is becoming more withdrawn, however he denies suicidal r homicidal ideation , though at times he wonders what his purpose inlife is. The problems started since he retired  approx 5 yrs ago , when he had his first onslaught of financial woes.  Preventive Screening-Counseling & Management  Alcohol-Tobacco     Smoking Cessation Counseling: yes  Allergies (verified): 1)  Ace Inhibitors  Review of Systems      See HPI General:  Complains of fatigue; denies chills, fever, and malaise. Eyes:  Denies discharge and red eye. ENT:  Denies hoarseness, nasal congestion, sinus pressure, and sore throat. CV:  Complains of shortness of breath with exertion; denies chest pain or discomfort, difficulty breathing while lying down, palpitations, and swelling of feet. Resp:  Complains of shortness of breath; denies cough and sputum productive; still smoking, and says he uses cigarretes for depression. GI:  Denies abdominal pain, constipation, diarrhea, nausea, and vomiting. GU:  Denies dysuria, urinary frequency, and urinary hesitancy. MS:  Complains of joint pain and stiffness. Derm:  Denies itching, lesion(s), and rash. Neuro:  Denies headaches, poor balance, and seizures. Psych:  Complains of depression, easily  tearful, and mental problems; denies suicidal thoughts/plans, thoughts of violence, and unusual visions or sounds; 1 year history, following his retiremen . Endo:  pt reports continued excessive fluctuation in his blood sugars, states at night his blood sugars are about 180 and he will wake up with them over 200, feels as though bedtime lantus raises his sugar.Reports sugar was as low as 18 in April, he was treated.. Heme:  Denies abnormal bruising and bleeding. Allergy:  Complains of seasonal allergies.  Physical Exam  General:  Well-developed,well-nourished,in no acute distress; alert,appropriate and cooperative throughout examination HEENT: No facial asymmetry,  EOMI, No sinus tenderness, TM's Clear, oropharynx  pink and moist.   Chest: Clear to auscultation bilaterally.  CVS: S1, S2, No murmurs, No S3.   Abd: Soft, Nontender.  MS: Adequate ROM spine, hips, shoulders and knees.  Ext: No edema.   CNS: CN 2-12 intact, power tone and sensation normal throughout.   Skin: Intact, no visible lesions or rashes.  Psych: Good eye contact, normal affect.  Memory intact, r depressed appearing.    Impression & Recommendations:  Problem # 1:  DEPRESSION (ICD-311) Assessment Deteriorated  His updated medication list for this problem includes:    Fluoxetine Hcl 10 Mg Caps (Fluoxetine hcl) .Marland Kitchen... Take 1 capsule by mouth once a day  Discussed treatment options, including trial of antidpressant medication. Will refer to behavioral health. Follow-up call in in 24-48 hours and recheck in  2 weeks, sooner as needed. Patient agrees to call if any worsening of symptoms or thoughts of doing harm arise. Verified that the patient has no suicidal ideation at this time.   Orders: Psychology Referral (Psychology)  Problem # 2:  ALLERGIC RHINITIS (ICD-477.9) Assessment: Improved  His updated medication list for this problem includes:    Fexofenadine Hcl 180 Mg Tabs (Fexofenadine hcl) .Marland Kitchen... Take 1 daily for  allergies  Problem # 3:  HYPERTENSION (ICD-401.9) Assessment: Unchanged  His updated medication list for this problem includes:    Furosemide 80 Mg Tabs (Furosemide) .Marland Kitchen... Take 1 tablet by mouth two times a day    Catapres-tts-2 0.2 Mg/24hr Ptwk (Clonidine hcl) .Marland Kitchen... Apply one patch every 7 days    Labetalol Hcl 300 Mg Tabs (Labetalol hcl) ..... One and one half tabs by mouth bid    Diovan 320 Mg Tabs (Valsartan) .Marland Kitchen... Take one tablet by mouth daily    Amlodipine Besylate 10 Mg Tabs (Amlodipine besylate) .Marland Kitchen... 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 1 by mouth once daily    Diovan 320 Mg Tabs (Valsartan) .Marland Kitchen... Take one tablet by mouth daily    Lantus Solostar 100 Unit/ml Soln (Insulin glargine) ..... Inject 12 units subcutaneously once daily    Novolog 100 Unit/ml Soln (Insulin aspart) ..... Sliding scale  Orders: Glucose, (CBG) (82962) T- Hemoglobin A1C ([id number])  Labs Reviewed: Creat: 2.28 (04/20/2009)    Reviewed HgBA1c results: 7.7 (03/26/2009), repoprts continued marked fluctuations in blood sugars, he is treated by endo  8.1 (11/28/2008)  Problem # 5:  TOBACCO ABUSE (ICD-305.1) Assessment: Unchanged  Encouraged smoking cessation and discussed different methods for smoking cessation.   Problem # 6:  OBESITY,MILD (ICD-278.00) Assessment: Unchanged  Ht: 70 (06/29/2009)   Wt: 232 (06/29/2009)   BMI: 33.41 (06/29/2009)  Complete Medication List: 1)  Aspirin 81 Mg Tbec (Aspirin) .Marland Kitchen.. 1 by mouth  once daily 2)  Furosemide 80 Mg Tabs (Furosemide) .... Take 1 tablet by mouth two times a day 3)  Catapres-tts-2 0.2 Mg/24hr Ptwk (Clonidine hcl) .... Apply one patch every 7 days 4)  Labetalol Hcl 300 Mg Tabs (Labetalol hcl) .... One and one half tabs by mouth bid 5)  Diovan 320 Mg Tabs (Valsartan) .... Take one tablet by mouth daily 6)  Klor-con M20 20 Meq Cr-tabs (Potassium chloride crys cr) .... Take 1 tablet by mouth two times a day 7)  Amlodipine Besylate 10 Mg Tabs (Amlodipine besylate) .... Take 1 tab daily 8)  Lantus Solostar 100 Unit/ml Soln (Insulin glargine) .... Inject 12 units subcutaneously once daily 9)  Novolog 100 Unit/ml Soln (Insulin aspart) .... Sliding scale 10)  Simvastatin 20 Mg Tabs (Simvastatin) .... One tab by mouth at bedtime 11)  Fexofenadine Hcl 180 Mg Tabs (Fexofenadine hcl) .... Take 1 daily for allergies 12)  Fluoxetine Hcl 10 Mg Caps (Fluoxetine hcl) .... Take 1 capsule by mouth once a day  Other Orders: T-Hepatic Function [phone]) T-Lipid Profile [phone]) T-TSH [phone])  Patient Instructions: 1)  Please schedule a follow-up appointment in 2 months. 2)  Tobacco is very bad for your health and your loved ones! You Should stop smoking!. 3)  Stop Smoking Tips: Choose a Quit date. Cut down before the Quit date. decide what you will do as a substitute when you feel the urge to smoke(gum,toothpick,exercise). 4)  It is important that you exercise regularly at least 20 minutes 5 times a week. If you develop chest pain, have severe difficulty breathing, or feel very tired , stop exercising immediately and seek medical attention. 5)  You need to lose weight. Consider a lower calorie diet and regular exercise.  6)  Check your blood sugars regularly. If your readings are usually above : or below 70 you should contact our office. 7)  BMP prior to visit, ICD-9: 8)  Hepatic Panel prior to visit, ICD-9: 9)  Lipid Panel prior to visit, ICD-9: 10)  TSH prior  to visit, ICD-9: 11)  HbgA1C prior to visit, ICD-9: 12)  New med fopr depression, also pls commit to regular physical activity, and I am referring you for therapy Prescriptions: FLUOXETINE HCL 10 MG CAPS (FLUOXETINE HCL) Take 1 capsule by mouth once a day  #30 x 2   Entered and Authorized by:   Syliva Overman MD   Signed by:   Syliva Overman MD on 06/29/2009   Method used:   Printed then faxed to ...       [facility]       [address]       [place], [place]  [address]       Ph: [phone] or [phone]       Fax: [phone]   RxID:   [id number]   Laboratory Results   Blood Tests     Glucose (fasting): 111 mg/dL   (Normal Range: 03-474)

## 2010-02-18 NOTE — Assessment & Plan Note (Signed)
Summary: F UP   Vital Signs:  Patient profile:   65 year old male Height:      70 inches Weight:      246.75 pounds BMI:     35.53 O2 Sat:      98 % on Room air Pulse rate:   56 / minute Pulse rhythm:   regular Resp:     16 per minute BP sitting:   128 / 58  (left arm)  Vitals Entered By: Adella Hare LPN (December 18, 2009 8:45 AM)  Nutrition Counseling: [name] BMI is greater than 25 and therefore counseled on weight management options.  O2 Flow:  Room air CC: follow-up visit/ head congestion Is Patient Diabetic? Yes Did you bring your meter with you today? No Comments did not bring meds to ov but states med list is correct    Primary Care Provider:  Dr. Syliva Overman  CC:  follow-up visit/ head congestion.  History of Present Illness: Reports  that he hjas not been doing well. his mental health has deteriorated once more, and on review, he has inadvertently stopped taking fluoxetine. 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    Denies chest pain, palpitations, PND, orthopnea or leg swelling. Denies abdominal pain, nausea, vomitting, diarrhea or constipation. Denies change in bowel movements or bloody stool. Denies dysuria , frequency, incontinence or hesitancy. Denies  joint pain, swelling, or reduced mobility. Denies headaches, vertigo, seizures.  Denies  rash, lesions, or itch. He tests his sugars regularly, and reports episodes of hypoglycemia. his hBA1c is 6.5 and I believ a reduction ,small , in his insulin is indicated, however that will be deferred to the endocrinologist who treats him.     Preventive Screening-Counseling & Management  Alcohol-Tobacco     Smoking Cessation Counseling: yes  Allergies (verified): 1)  Ace Inhibitors  Review of Systems      See HPI General:  Complains of chills, fatigue, and sleep disorder. Eyes:  Denies discharge  and red eye. ENT:  Complains of hoarseness, nasal congestion, postnasal drainage, and sinus pressure; 1 week history of inc pressure and cream  nasal drainage. Resp:  Complains of cough and sputum productive; 1 week. GU:  Complains of erectile dysfunction; denies discharge and dysuria; worsening problems wants help, difficulty with initiating and maintaining erections . MS:  Complains of joint pain and stiffness. Psych:  Complains of anxiety, depression, irritability, and mental problems; denies alternate hallucination ( auditory/visual), suicidal thoughts/plans, thoughts of violence, and unusual visions or sounds. Endo:  Denies excessive thirst and excessive urination; hypoglycemic episodes at times. Heme:  Denies abnormal bruising and bleeding. Allergy:  Complains of seasonal allergies.  Physical Exam  General:  Well-developed,well-nourished,in no acute distress; alert,appropriate and cooperative throughout examination HEENT: No facial asymmetry,  EOMI, maxillary sinus tenderness, TM's Clear, oropharynx  pink and moist.   Chest: decreased air entry, scattered crackles and few wheezes CVS: S1, S2, No murmurs, No S3.   Abd: Soft, Nontender.  MS: Adequate ROM spine, hips, shoulders and knees.  Ext: No edema.   CNS: CN 2-12 intact, power tone and sensation normal throughout.   Skin: Intact, no visible lesions or rashes.  Psych: Good eye contact, normal affect.  Memory intact, not anxious or depressed appearing.    Impression & Recommendations:  Problem # 1:  SINUSITIS, MAXILLARY, ACUTE (ICD-461.0) Assessment Comment Only  His updated medication  list for this problem includes:    Penicillin V Potassium 500 Mg Tabs (Penicillin v potassium) .Marland Kitchen... Take 1 tablet by mouth three times a day    Tessalon Perles 100 Mg Caps (Benzonatate) .Marland Kitchen... Take 1 capsule by mouth three times a day  Problem # 2:  HYPERTENSION (ICD-401.9) Assessment: Improved  His updated medication list for this problem  includes:    Torsemide 20 Mg Tabs (Torsemide) .Marland Kitchen... Take 4 tablets by mouth two times a day    Catapres-tts-2 0.2 Mg/24hr Ptwk (Clonidine hcl) .Marland Kitchen... Apply one patch every 7 days    Labetalol Hcl 300 Mg Tabs (Labetalol hcl) .Marland Kitchen... Take 2 tabs three times a day    Diovan 160 Mg Tabs (Valsartan) .Marland Kitchen... Take 1 tablet by mouth two times a day    Amlodipine Besylate 10 Mg Tabs (Amlodipine besylate) .Marland Kitchen... Take 1 tab daily  BP today: 128/58 Prior BP: 151/61 (12/09/2009)  Prior 10 Yr Risk Heart Disease: 27 % (10/24/2008)  Labs Reviewed: K+: 4.0 (12/15/2009) Creat: : 2.25 (12/15/2009)   Chol: 185 (03/26/2009)   HDL: 58 (03/26/2009)   LDL: 113 (03/26/2009)   TG: 69 (03/26/2009)  Problem # 3:  TOBACCO ABUSE (ICD-305.1) Assessment: Unchanged  Encouraged smoking cessation and discussed different methods for smoking cessation.   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 1 by mouth once daily    Diovan 160 Mg Tabs (Valsartan) .Marland Kitchen... Take 1 tablet by mouth two times a day    Lantus Solostar 100 Unit/ml Soln (Insulin glargine) ..... Inject20  units subcutaneously pm    Novolog 100 Unit/ml Soln (Insulin aspart) ..... Sliding scale  Labs Reviewed: Creat: 2.25 (12/15/2009)    Reviewed HgBA1c results: 7.7 (03/26/2009), hBA1C in november 6.5, followed by endo, will defer to endo fo mx decisions  8.1 (11/28/2008)  Problem # 5:  ACUTE BRONCHITIS (ICD-466.0) Assessment: Comment Only  His updated medication list for this problem includes:    Penicillin V Potassium 500 Mg Tabs (Penicillin v potassium) .Marland Kitchen... Take 1 tablet by mouth three times a day    Tessalon Perles 100 Mg Caps (Benzonatate) .Marland Kitchen... Take 1 capsule by mouth three times a day  Complete Medication List: 1)  Aspirin 81 Mg Tbec (Aspirin) .Marland Kitchen.. 1 by mouth once daily 2)  Torsemide 20 Mg Tabs (Torsemide)  .... Take 4 tablets by mouth two times a day 3)  Catapres-tts-2 0.2 Mg/24hr Ptwk (Clonidine hcl) .... Apply one patch every 7 days 4)  Labetalol Hcl 300 Mg Tabs (Labetalol hcl) .... Take 2 tabs three times a day 5)  Diovan 160 Mg Tabs (Valsartan) .... Take 1 tablet by mouth two times a day 6)  Klor-con M20 20 Meq Cr-tabs (Potassium chloride crys cr) .... Take 1 tablet by mouth two times a day 7)  Amlodipine Besylate 10 Mg Tabs (Amlodipine besylate) .... Take 1 tab daily 8)  Lantus Solostar 100 Unit/ml Soln (Insulin glargine) .... Inject20  units subcutaneously pm 9)  Novolog 100 Unit/ml Soln (Insulin aspart) .... Sliding scale 10)  Calcitriol 0.25 Mcg Caps (Calcitriol) .... Take 1 tab daily 11)  Colace 100 Mg Caps (Docusate sodium) .Marland Kitchen.. 1 by mouth two times a day 12)  Miralax Powd (Polyethylene glycol 3350) .Marland Kitchen.. 1 capful daily as needed for constipation 13)  Penicillin V Potassium 500 Mg Tabs (Penicillin v potassium) .... Take 1 tablet by mouth  three times a day 14)  Tessalon Perles 100 Mg Caps (Benzonatate) .... Take 1 capsule by mouth three times a day 15)  Fluoxetine Hcl 10 Mg Caps (Fluoxetine hcl) .... Take 1 capsule by mouth once a day  Other Orders: Medicare Electronic Prescription [phone]) Psychology Referral (Psychology)  Patient Instructions: 1)  Please schedule a follow-up appointment in 2 months. 2)  Tobacco is very bad for your health and your loved ones! You Should stop smoking!. 3)  Stop Smoking Tips: Choose a Quit date. Cut down before the Quit date. decide what you will do as a substitute when you feel the urge to smoke(gum,toothpick,exercise). 4)  you are being treated for sinusitis and bronchitis.' 5)  pLs start taking the meds for depression and also you need to start therapy 6)  you will be called next week about the lab. 7)  All the best with colonscopy. 8)  i believe the dose of your insulin can be reduced, pls discuss with dr Kerrie Pleasure Prescriptions: FLUOXETINE HCL 10  MG CAPS (FLUOXETINE HCL) Take 1 capsule by mouth once a day  #30 x 2   Entered and Authorized by:   Syliva Overman MD   Signed by:   Syliva Overman MD on 12/18/2009   Method used:   Printed then faxed to ...       [facility]       [address]       [place], [place]  [address]       Ph: [phone] or [phone]       Fax: [phone]   RxID:   [id number] TESSALON PERLES 100 MG CAPS (BENZONATATE) Take 1 capsule by mouth three times a day  #21 x 0   Entered and Authorized by:   Syliva Overman MD   Signed by:   Syliva Overman MD on 12/18/2009   Method used:   Printed then faxed to ...       [facility]ail[facility]       [address]       [place], [place]  [address]       Ph: [phone] or [phone]       Fax: [phone]   RxID:   [id number] PENICILLIN V POTASSIUM 500 MG TABS (PENICILLIN V POTASSIUM) Take 1 tablet by mouth three times a day  #30 x 0   Entered and Authorized by:   Syliva Overman MD   Signed by:   Syliva Overman MD on 12/18/2009   Method used:   Printed then faxed to ...       [facility]ail[facility]       [address]       [place], [place]  [address]       Ph: [phone] or [phone]       Fax: [phone]   RxID:   [id number]    Orders Added: 1)  Est. Patient Level IV [01751] 2)  Medicare Electronic Prescription [G8553] 3)  Psychology Referral [Psychology]

## 2010-02-18 NOTE — Progress Notes (Signed)
Summary: lantus pin  Phone Note Call from [name]   Summary of Call: needs a rx for lantus pin called into [facility]. [phone] Initial call taken by: Rudene Anda,  January 26, 2009 10:43 AM  Follow-up for Phone Call        [name] currently has vials, is it ok to send in pen? Follow-up by: Worthy Keeler LPN,  January 26, 2009 10:48 AM  Additional Follow-up for Phone Call Additional follow up Details #1::        check with him, if he wants the pen, explain the vials and syringes notr necessary any more, and ok to switch if he wants keep the dose the same pls Additional Follow-up by: Syliva Overman MD,  January 26, 2009 12:25 PM    Additional Follow-up for Phone Call Additional follow up Details #2::    returned call, no answer Follow-up by: Worthy Keeler LPN,  January 26, 2009 2:05 PM  Additional Follow-up for Phone Call Additional follow up Details #3:: Details for Additional Follow-up Action Taken: [name] states he has been on the pen for some time, not vial rx sent Additional Follow-up by: Worthy Keeler LPN,  January 26, 2009 3:15 PM  New/Updated Medications: LANTUS SOLOSTAR 100 UNIT/ML SOLN (INSULIN GLARGINE) inject 12 units subcutaneously once daily Prescriptions: LANTUS SOLOSTAR 100 UNIT/ML SOLN (INSULIN GLARGINE) inject 12 units subcutaneously once daily  #1 mnth x 2   Entered by:   Worthy Keeler LPN   Authorized by:   Syliva Overman MD   Signed by:   Worthy Keeler LPN on 45/40/9811   Method used:   Faxed to ...       [facility]       [address]       [place], [place]  [address]       Ph: [phone] or [phone]       Fax: [phone]   RxID:   [id number]

## 2010-02-18 NOTE — Miscellaneous (Signed)
Summary: medication changes,lab orders  Clinical Lists Changes  Medications: Changed medication from KLOR-CON M20 20 MEQ CR-TABS (POTASSIUM CHLORIDE CRYS CR) take one tablet by mouth daily to KLOR-CON M20 20 MEQ CR-TABS (POTASSIUM CHLORIDE CRYS CR) Take 1 tablet by mouth two times a day Changed medication from FUROSEMIDE 80 MG TABS (FUROSEMIDE) Take 2 tablet by mouth two times a day to FUROSEMIDE 80 MG TABS (FUROSEMIDE) Take 1 tablet by mouth two times a day Orders: Added new Test order of T-Basic Metabolic Panel [phone]) - Signed Added new Test order of T-Basic Metabolic Panel [id number]) - Signed

## 2010-02-18 NOTE — Progress Notes (Signed)
Summary: a1c level  Phone Note Call from Patient   Summary of Call: [phone] him to tell about a1c level Initial call taken by: Lind Guest,  March 27, 2009 9:04 AM  Follow-up for Phone Call        patient aware Follow-up by: Adella Hare LPN,  March 27, 2009 9:42 AM

## 2010-02-18 NOTE — Assessment & Plan Note (Signed)
Summary: CONSULT FOR TCS, CONSTIPATION/SS   Visit Type:  Follow-up Consult Referring Provider:  Dr. Syliva [name] Primary Care Provider:  Dr. Syliva [name]  CC:  constipation and consult for tcs.  History of Present Illness: [name] is a pleasant 65 year old male who presents today at the request of Dr. Lodema [name] secondary to constipation and consult for TCS. Last TCS done August 2006: normal rectum, diminutive polyp of rectosigmoid, biopsy:inflamed focally adenomatous polyp. He presents today reporting BM every 3-4 days, hard, no melena or hematochezia. Denies N/V or abdominal pain. No weight loss. Does report lack of appetite and has to "force" self to eat, deneis dysphagia.   Current Medications (verified): 1)  Aspirin 81 Mg  Tbec (Aspirin) .Marland Kitchen.. 1 By Mouth Once Daily 2)  Torsemide 20 Mg Tabs (Torsemide) .... Take 4 Tablets By Mouth Two Times A Day 3)  Catapres-Tts-2 0.2 Mg/24hr Ptwk (Clonidine Hcl) .... Apply One Patch Every 7 Days 4)  Labetalol Hcl 300 Mg Tabs (Labetalol Hcl) .... Take 2 Tabs Three Times A Day 5)  Diovan 160 Mg Tabs (Valsartan) .... Take 1 Tablet By Mouth Two Times A Day 6)  Klor-Con M20 20 Meq Cr-Tabs (Potassium Chloride Crys Cr) .... Take 1 Tablet By Mouth Two Times A Day 7)  Amlodipine Besylate 10 Mg Tabs (Amlodipine Besylate) .... Take 1 Tab Daily 8)  Lantus Solostar 100 Unit/ml Soln (Insulin Glargine) .... Inject20  Units Subcutaneously Pm 9)  Novolog 100 Unit/ml Soln (Insulin Aspart) .... Sliding Scale 10)  Calcitriol 0.25 Mcg Caps (Calcitriol) .... Take 1 Tab Daily 11)  Colace 100 Mg Caps (Docusate Sodium) .Marland Kitchen.. 1 By Mouth Two Times A Day 12)  Miralax  Powd (Polyethylene Glycol 3350) .Marland Kitchen.. 1 Capful Daily As Needed For Constipation  Allergies (verified): 1)  Ace Inhibitors  Past History:  Past Surgical History: Laser therapy for diabetic retinopathy lymph node biopsy? (neck)  Review of Systems General:  Denies fever, chills, and anorexia. Eyes:   Denies blurring, irritation, and discharge. ENT:  Denies sore throat, hoarseness, and difficulty swallowing. CV:  Denies palpitations and orthopnea. Resp:  Denies dyspnea at rest and wheezing. GI:  Complains of constipation; denies difficulty swallowing, pain on swallowing, nausea, bloody BM's, and black BMs. GU:  Denies urinary burning and urinary frequency. MS:  Denies joint pain / LOM, joint swelling, and joint stiffness. Derm:  Denies rash, itching, and dry skin. Neuro:  Denies weakness and syncope. Psych:  Denies depression and anxiety. Endo:  Denies cold intolerance and heat intolerance.  Vital Signs:  Patient profile:   65 year old male Height:      70 inches Weight:      254 pounds BMI:     36.58 Temp:     98.1 degrees F oral Pulse rate:   76 / minute BP sitting:   138 / 82  (left arm) Cuff size:   regular  Vitals Entered By: Harold Limes LPN (December 03, 2009 11:31 AM)  Physical Exam  General:  Well developed, well nourished, no acute distress.obese.   Head:  Normocephalic and atraumatic. Lungs:  Clear throughout to auscultation. Heart:  Regular rate and rhythm; no murmurs, rubs,  or bruits. Abdomen:  normal bowel sounds, obese, without guarding, without rebound, no distesion, and no hepatomegally or splenomegaly.   Msk:  Symmetrical with no gross deformities. Normal posture. Extremities:  No clubbing, cyanosis, edema or deformities noted. Neurologic:  Alert and  oriented x4;  grossly normal neurologically.  Impression & Recommendations:  Problem #  1:  CONSTIPATION (ICD-58.4)  65 year old male with constipation, BM every 3-4 days, hard, no melena or hematochezia. No abdominal pain, N/V. Denies weight loss. Not currently taking anything for bowel regimen. Last TCS August 2006 with noted repeat TCS to be done in 5 years, now presents for assessment prior to colonoscopy.  Begin Colace and Miralax Fiber daily encourage fluid intake Set up for TCS with Dr. Jena [name]:  the risks and benefits have been discussed in detail, and he stated understanding. verbal consent obtained.  Orders: Consultation Level III (04540)  Problem # 2:  SCREENING COLORECTAL-CANCER (ICD-V76.51)  See #1.   Orders: Consultation Level III (98119) I would like to thank Dr. Syliva [name] for the referral of this nice gentleman for his screening colonoscopy.

## 2010-02-18 NOTE — Progress Notes (Signed)
[facility]   Imported By: Lind Guest 04/29/2009 09:29:15  _____________________________________________________________________  External Attachment:    Type:   Image     Comment:   External Document

## 2010-02-18 NOTE — Assessment & Plan Note (Signed)
Summary: ROV WEIGHT GAIN   Primary Provider:  Dr. Syliva Overman   History of Present Illness: [name] is here today for follow-up because of weight gain.  He has a history of diastolic CHF, CKD, hypertension, and diabetes.  He has a "Optimal Scale" that is apparently attached to a phone line. He weighs daily and was told he had gain too much weight over one month and should follow-up with cardiologist.  He admits to noticing wt gain, but has not had increased work of breathing or evidence of fluid retention.  He says is normal wt is 230.  Current Medications (verified): 1)  Aspirin 81 Mg  Tbec (Aspirin) .Marland Kitchen.. 1 By Mouth Once Daily 2)  Torsemide 20 Mg Tabs (Torsemide) .... Take 4 Tablets By Mouth Two Times A Day 3)  Catapres-Tts-2 0.2 Mg/24hr Ptwk (Clonidine Hcl) .... Apply One Patch Every 7 Days 4)  Labetalol Hcl 300 Mg Tabs (Labetalol Hcl) .... Take 2 Tabs Three Times A Day 5)  Diovan 160 Mg Tabs (Valsartan) .... Take 1 Tablet By Mouth Two Times A Day 6)  Klor-Con M20 20 Meq Cr-Tabs (Potassium Chloride Crys Cr) .... Take 1 Tablet By Mouth Two Times A Day 7)  Amlodipine Besylate 10 Mg Tabs (Amlodipine Besylate) .... Take 1 Tab Daily 8)  Lantus Solostar 100 Unit/ml Soln (Insulin Glargine) .... Inject20  Units Subcutaneously Pm 9)  Novolog 100 Unit/ml Soln (Insulin Aspart) .... Sliding Scale 10)  Pravastatin Sodium 40 Mg Tabs (Pravastatin Sodium) .... Take 1 Tab By Mouth At Bedtime 11)  Calcitriol 0.25 Mcg Caps (Calcitriol) .... Take 1 Tab Daily  Allergies (verified): 1)  Ace Inhibitors  Review of Systems       Orthopnea  All other systems have been reviewed and are negative unless stated above.   Physical Exam  General:  Well developed, well nourished, in no acute distress.obese.   Lungs:  Clear bilaterally to auscultation and percussion. Heart:  Distant heart sounds with diastolic murmur Abdomen:  Obese, no distention with normal bowel sounds. Msk:  Back normal, normal gait.  Muscle strength and tone normal. Pulses:  pulses normal in all 4 extremities Extremities:  1+ left pedal edema and 1+ right pedal edema.   Neurologic:  Alert and oriented x 3. Psych:  Normal affect.   Impression & Recommendations:  Problem # 1:  ACUTE DIASTOLIC HEART FAILURE (ICD-428.31) He states he has gained 14 pounds in tow months.  He admits to some increased calories over the summer and fall with beer drinking.  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  He takes his medications everyday.  He may be having some resistance to lasix as he has been on it for several years. Will change to torsemide 80mg  two times a day from Lasix 160mg  two times a day.  He is advised to take this on an empty stomach.  Continue to weigh daily.  I have also advised him to begin an exericise program and do this for , sustained, everyday.  He will return to office in one week for BMET, BP and wt check. His updated medication list for this problem includes:    Aspirin 81 Mg Tbec (Aspirin) .Marland Kitchen... 1 by mouth once daily    Torsemide 20 Mg Tabs (Torsemide) .Marland Kitchen... Take 4 tablets by mouth two times a day    Labetalol Hcl 300 Mg Tabs (Labetalol hcl) .Marland Kitchen... Take 2 tabs three times a day  Diovan 160 Mg Tabs (Valsartan) .Marland Kitchen... Take 1 tablet by mouth two times a day    Amlodipine Besylate 10 Mg Tabs (Amlodipine besylate) .Marland Kitchen... Take 1 tab daily  Problem # 2:  HYPERTENSION (ICD-401.9) Moderately controlled.  Will monitor this.  He is advised on low salt diet. His updated medication list for this problem includes:    Aspirin 81 Mg Tbec (Aspirin) .Marland Kitchen... 1 by mouth once daily    Torsemide 20 Mg Tabs (Torsemide) .Marland Kitchen... Take 4 tablets by mouth two times a day    Catapres-tts-2 0.2 Mg/24hr Ptwk (Clonidine hcl) .Marland Kitchen... Apply one patch every 7 days    Labetalol Hcl 300 Mg Tabs (Labetalol hcl) .Marland Kitchen... Take 2 tabs three times a day    Diovan 160 Mg Tabs (Valsartan) .Marland Kitchen... Take 1 tablet by mouth  two times a day    Amlodipine Besylate 10 Mg Tabs (Amlodipine besylate) .Marland Kitchen... Take 1 tab daily  Future Orders: T-Basic Metabolic Panel [id number]) ... 12/09/2009  Patient Instructions: 1)  Your physician recommends that you schedule a follow-up appointment in: 1 month 2)  Your physician recommends that you return for lab work in: 1 week  3)  Your physician has recommended you make the following change in your medication:  stop furosemide and start torsemide 80mg  two times a day 4)  You have been referred to nurse visit 1 week Prescriptions: TORSEMIDE 20 MG TABS (TORSEMIDE) take 4 tablets by mouth two times a day  #240 x 3   Entered by:   Harold Lower RN   Authorized by:   Harold Reining, NP   Signed by:   Harold Lower RN on 12/02/2009   Method used:   Faxed to ...       [facility]       [address]       [place], [place]  [address]       Ph: [phone] or [phone]       Fax: [phone]   RxID:   [id number]

## 2010-02-18 NOTE — Progress Notes (Signed)
Summary: [facility]   Imported By: Lind Guest 04/13/2009 14:56:22  _____________________________________________________________________  External Attachment:    Type:   Image     Comment:   External Document

## 2010-02-18 NOTE — Assessment & Plan Note (Signed)
Summary: office visit   Vital Signs:  Patient profile:   65 year old male Height:      70 inches Weight:      233.50 pounds BMI:     33.62 O2 Sat:      99 % on Room air Pulse rate:   83 / minute Pulse rhythm:   regular Resp:     16 per minute BP sitting:   120 / 60  (left arm)  Vitals Entered By: Worthy Keeler LPN (January 22, 2009 8:22 AM)  Nutrition Counseling: [name] BMI is greater than 25 and therefore counseled on weight management options.  O2 Flow:  Room air CC: body aches, head congestion, x 2days Is Patient Diabetic? Yes Did you bring your meter with you today? No   Primary Care Provider:  DR.MARGARET SIMPSON  CC:  body aches, head congestion, and x 2days.  History of Present Illness: 3 day h/o acute generalised body aches, fever, chills, weakness, poor apetite, sore throat, headache , throat scratchy at times. Prior to this he reports that he had been doing well with improvement in his blood sugars, and resolution of his leg swelling. He is upset thaT HE HAD TO GIVE UP A JOB RECENTLY BECAUSE OF ILL HEALTH.  Current Medications (verified): 1)  Aspirin 81 Mg  Tbec (Aspirin) .Marland Kitchen.. 1 By Mouth Once Daily 2)  Furosemide 80 Mg Tabs (Furosemide) .... Take 1 Tablet By Mouth Two Times A Day 3)  Clonidine Hcl 0.2 Mg Tabs (Clonidine Hcl) .... Take 1 Tablet By Mouth Three Times A Day 4)  Lantus 100 Unit/ml Soln (Insulin Glargine) .Marland Kitchen.. 12 Units At Night 5)  Labetalol Hcl 300 Mg Tabs (Labetalol Hcl) .... One and One Half Tabs By Mouth Bid 6)  Diovan 320 Mg Tabs (Valsartan) .... Take One Tablet By Mouth Daily 7)  Klor-Con M20 20 Meq Cr-Tabs (Potassium Chloride Crys Cr) .... Take One Tablet By Mouth Daily 8)  Amlodipine Besylate 5 Mg Tabs (Amlodipine Besylate) .... Take 1 Tablet By Mouth Once A Day 9)  Onglyza 5 Mg Tabs (Saxagliptin Hcl) .... 1/2 Tab Once Daily 10)  Prandin 2 Mg Tabs (Repaglinide) .... Take 1 Tablet By Mouth Three Times A Day 11)  Flonase 50 Mcg/act Susp  (Fluticasone Propionate) .... Two Puffs Once Daily Prn  Allergies (verified): 1)  Ace Inhibitors  Review of Systems      See HPI General:  Complains of chills, fatigue, fever, malaise, and weight loss. Eyes:  Denies blurring and discharge. ENT:  Complains of nasal congestion, postnasal drainage, and sore throat; denies earache and sinus pressure. CV:  Denies chest pain or discomfort, palpitations, and swelling of feet. Resp:  Complains of cough, shortness of breath, and sputum productive. GI:  Denies abdominal pain, constipation, diarrhea, nausea, and vomiting. GU:  Denies dysuria and urinary frequency. MS:  Complains of joint pain, low back pain, and mid back pain. Derm:  Denies itching and rash. Neuro:  Complains of headaches; denies seizures, sensation of room spinning, and tingling. Psych:  Complains of anxiety; denies depression. Endo:  three times daily sugar testing, fastings gen between90 to 160, before lunch range 200 to 250, bedtime sugars generally120. Heme:  Denies abnormal bruising and bleeding. Allergy:  Complains of seasonal allergies; denies hives or rash.  Physical Exam  General:  Well-developedobese,in no acute distress; alert,appropriate and cooperative throughout examinationIll appearing. HEENT: No facial asymmetry,  EOMI, No sinus tenderness, TM's Clear, oropharynx  pink and moist. erythema and edma  of nasal mucosa  Chest: decreased air entry, scatteed crackles, no wheezes  CVS: S1, S2, No murmurs, No S3.   Abd: Soft, Nontender.  MS: decreased  ROM spine,adequate in  hips, shoulders and knees.  Ext: No edema.   CNS: CN 2-12 intact, power tone and sensation normal throughout.   Skin: Intact, no visible lesions or rashes.  Psych: Good eye contact, normal affect.  Memory intact, not anxious or depressed appearing.    Impression & Recommendations:  Problem # 1:  INFLUENZA WITH OTHER MANIFESTATIONS (ICD-487.8) Assessment Comment Only  Orders: ASA 325mg  tab  [facility]) Rocephin  250mg  (W2956) Admin of Therapeutic Inj  intramuscular or subcutaneous ([address])  Problem # 2:  DEPENDENT EDEMA, LEGS (ICD-782.3) Assessment: Improved  His updated medication list for this problem includes:    Furosemide 80 Mg Tabs (Furosemide) .Marland Kitchen... 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 1 by mouth once daily    Diovan 320 Mg Tabs (Valsartan) .Marland Kitchen... Take one tablet by mouth daily    Onglyza 5 Mg Tabs (Saxagliptin hcl) .Marland Kitchen... 1/2 tab once daily    Prandin 2 Mg Tabs (Repaglinide) .Marland Kitchen... Take 1 tablet by mouth three times a day    Lantus Solostar 100 Unit/ml Soln (Insulin glargine) ..... Inject 12 units subcutaneously once daily  Labs Reviewed: Creat: 2.01 (11/28/2008)    Reviewed HgBA1c results: 8.1 (11/28/2008)  7.6 (09/15/2008)  Problem # 4:  HYPERTENSION (ICD-401.9) Assessment: Improved  His updated medication list for this problem includes:    Furosemide 80 Mg Tabs (Furosemide) .Marland Kitchen... Take 1 tablet by mouth two times a day    Clonidine Hcl 0.2 Mg Tabs (Clonidine hcl) .Marland Kitchen... Take 1 tablet by mouth three times a day    Labetalol Hcl 300 Mg Tabs (Labetalol hcl) ..... One and one half tabs by mouth bid    Diovan 320 Mg Tabs (Valsartan) .Marland Kitchen... Take one tablet by mouth daily    Amlodipine Besylate 5 Mg Tabs (Amlodipine besylate) .Marland Kitchen... Take 1 tablet by mouth once a day  BP today: 120/60 Prior BP: 147/59 (11/27/2008)  Prior 10 Yr Risk Heart Disease: 27 % (10/24/2008)  Labs Reviewed: K+: 4.0 (11/28/2008) Creat: : 2.01 (11/28/2008)   Chol: 175 (08/17/2007)   HDL: 63 (08/17/2007)   LDL: 95 (08/17/2007)   TG: 84 (08/17/2007)  Problem # 5:  TOBACCO ABUSE (ICD-305.1) Assessment: Unchanged  Encouraged smoking cessation and discussed different methods for smoking cessation.    Problem # 6:  ACUTE BRONCHITIS (ICD-466.0) Assessment: Comment Only  The following medications were removed from the medication list:    Tussionex Pennkinetic Er 8-10 Mg/60ml Lqcr (Chlorpheniramine-hydrocodone) .Marland Kitchen... 1 teaspoon at bedtime as needed    Symbicort 80-4.5 Mcg/act Aero (Budesonide-formoterol fumarate) .Marland Kitchen..Marland Kitchen Two puffs by mouth bid    Spiriva Handihaler 18 Mcg Caps (Tiotropium bromide monohydrate) .Marland Kitchen... 1 puff daily His updated medication list for this problem includes:    Tessalon Perles 100 Mg Caps (Benzonatate) .Marland Kitchen... Take 1 capsule by mouth three times a day  Problem # 7:  OBESITY, UNSPECIFIED (ICD-278.00) Assessment: Deteriorated  Ht: 70 (01/22/2009)   Wt: 233.50 (01/22/2009)   BMI: 33.62 (01/22/2009)  Complete Medication List: 1)  Aspirin 81 Mg Tbec (Aspirin) .Marland Kitchen.. 1 by mouth once daily 2)  Furosemide 80 Mg Tabs (Furosemide) .... Take 1 tablet  by mouth two times a day 3)  Clonidine Hcl 0.2 Mg Tabs (Clonidine hcl) .... Take 1 tablet by mouth three times a day 4)  Labetalol Hcl 300 Mg Tabs (Labetalol hcl) .... One and one half tabs by mouth bid 5)  Diovan 320 Mg Tabs (Valsartan) .... Take one tablet by mouth daily 6)  Klor-con M20 20 Meq Cr-tabs (Potassium chloride crys cr) .... Take one tablet by mouth daily 7)  Amlodipine Besylate 5 Mg Tabs (Amlodipine besylate) .... Take 1 tablet by mouth once a day 8)  Onglyza 5 Mg Tabs (Saxagliptin hcl) .... 1/2 tab once daily 9)  Prandin 2 Mg Tabs (Repaglinide) .... Take 1 tablet by mouth three times a day 10)  Flonase 50 Mcg/act Susp (Fluticasone propionate) .... Two puffs once daily prn 11)  Tessalon Perles 100 Mg Caps (Benzonatate) .... Take 1 capsule by mouth three times a day 12)  Tamiflu 75 Mg Caps (Oseltamivir phosphate) .... Take 1 capsule by mouth two times a day 13)  Lantus Solostar 100 Unit/ml Soln (Insulin glargine) .... Inject 12 units subcutaneously once daily  Patient Instructions: 1)  F/U in early March. 2)  You  are being treated for influenza, also for bronchitis. 3)  Meds are being sent in also. Prescriptions: TAMIFLU 75 MG CAPS (OSELTAMIVIR PHOSPHATE) Take 1 capsule by mouth two times a day  #10 x 0   Entered and Authorized by:   Syliva Overman MD   Signed by:   Syliva Overman MD on 01/22/2009   Method used:   Printed then faxed to ...       Hospital doctor (retail)       [address]       [place], [place]  [address]       Ph: [phone] or [phone]       Fax: [phone]   RxID:   [id number] TESSALON PERLES 100 MG CAPS (BENZONATATE) Take 1 capsule by mouth three times a day  #21 x 0   Entered and Authorized by:   Syliva Overman MD   Signed by:   Syliva Overman MD on 01/22/2009   Method used:   Printed then faxed to ...       Hospital doctor (retail)       [address]       [place], [place]  [address]       Ph: [phone] or [phone]       Fax: [phone]   RxID:   [id number] ZITHROMAX Z-PAK 250 MG TABS (AZITHROMYCIN) Use as directed  #6 x 0   Entered and Authorized by:   Syliva Overman MD   Signed by:   Syliva Overman MD on 01/22/2009   Method used:   Printed then faxed to ...       Hospital doctor (retail)       [address]       [place], [place]  [address]       Ph: [phone] or [phone]       Fax: [phone]   RxID:   [id number]    Medication Administration  Injection # 1:    Medication: Rocephin  250mg     Diagnosis: INFLUENZA WITH OTHER MANIFESTATIONS (ICD-487.8)    Route: IM    Site: RUOQ gluteus    Exp Date: 9/12  Lot #: [id number]    Mfr: sandoz    Comments: rocephin 500mg  given    Patient tolerated injection without complications    Given by: Worthy Keeler LPN (January 22, 2009 9:13 AM)  Medication # 1:    Medication: ASA 325mg  tab    Diagnosis: INFLUENZA WITH OTHER MANIFESTATIONS (ICD-487.8)    Dose: 2 tablets     Route: po    Exp Date: 9/11    Lot #: [id number]    Patient tolerated medication without complications    Given by: Worthy Keeler LPN (January 22, 2009 9:12 AM)  Orders Added: 1)  Est. Patient Level IV [51884] 2)  ASA 325mg  tab [EMRORAL] 3)  Rocephin  250mg  [J0696] 4)  Admin of Therapeutic Inj  intramuscular or subcutaneous [[address]]

## 2010-02-18 NOTE — Miscellaneous (Signed)
**Note De-Identified Richardine Peppers Obfuscation** Summary: clonidine  Clinical Lists Changes  Medications: Rx of CATAPRES-TTS-2 0.2 MG/24HR PTWK (CLONIDINE HCL) apply one patch every 7 days;  #5 x 6;  Signed;  Entered by: Larita Fife Freddrick Gladson LPN;  Authorized by: Kathlen Brunswick, MD, [facility];  Method used: Faxed to [facility], [phone] [address], [place], [place], [place]  [address], Ph: [phone] or [phone], Fax: [phone]    Prescriptions: CATAPRES-TTS-2 0.2 MG/24HR PTWK (CLONIDINE HCL) apply one patch every 7 days  #5 x 6   Entered by:   Larita Fife Hye Trawick LPN   Authorized by:   Kathlen Brunswick, MD, [facility]   Signed by:   Larita Fife Gareth Fitzner LPN on 95/28/4132   Method used:   Faxed to ...       Hospital doctor (retail)       [address]       [place], [place]  [address]       Ph: [phone] or [phone]       Fax: [phone]   RxID:   [id number]

## 2010-02-18 NOTE — Letter (Signed)
Summary: [place] Results Engineer, agricultural at [facility]  [address] [place] [address]   Phone: [phone]  Fax: [phone]      March 30, 2009 MRN: [id number]   [name] [address] [place], [place]  [address]   Dear [name],  Your test ordered by Selena Batten has been reviewed by your physician (or physician assistant) and was found to be normal or stable. Your physician (or physician assistant) felt no changes were needed at this time.  ____ Echocardiogram  ____ Cardiac Stress Test  _x___ Lab Work  ____ Peripheral vascular study of arms, legs or neck  ____ CT scan or X-ray  ____ Lung or Breathing test  ____ Other:  Please decrease lasix to 80mg  twice a day, increase potassium to twice a day,labwork on 04/06/2009 and 04/20/2009.  Weigh daily and callour office for a 5 pound or more weight gain, per Dr. Dietrich Pates. Thank you, Sharline Lehane Allyne Gee RN    Selma Bing, MD, [facility].C.Gaylord Shih, MD, F.A.C.C Lewayne Bunting, MD, F.A.C.C Nona Dell, MD, F.A.C.C Charlton Haws, MD, [facility] Arena.C.C

## 2010-02-18 NOTE — Letter (Signed)
Summary: [place] [facility] Work Engineer, agricultural at [employer]  [address] [place] [address]   Phone: [phone]  Fax: [phone]     March 30, 2009 MRN: [id number]   [name] [address] [place], [place]  [address]      YOUR LAB WORK IS DUE   _____________APRIL 4, 2011____________________________  Please go to [facility], located across the street from [facility] on the second floor.  Hours are Monday - Friday 7am until 7:30pm         Saturday 8am until 12noon    __  DO NOT EAT OR DRINK AFTER MIDNIGHT EVENING PRIOR TO LABWORK  _X_ YOUR LABWORK IS NOT FASTING --YOU MAY EAT PRIOR TO LABWORK

## 2010-02-18 NOTE — Assessment & Plan Note (Signed)
Summary: F UP   Vital Signs:  Patient profile:   65 year old male Height:      70 inches Weight:      233 pounds BMI:     33.55 O2 Sat:      98 % Pulse rate:   68 / minute Pulse rhythm:   irregular Resp:     16 per minute BP sitting:   130 / 54  (left arm) Cuff size:   large  Vitals Entered By: Everitt Amber LPN (September 08, 2009 8:28 AM)  Nutrition Counseling: [name] BMI is greater than 25 and therefore counseled on weight management options. CC: has been eating right and everything but he can't see to lose any weight and his blood sugars have been high   Primary Care S[place] Gomm:  Dr. Syliva Overman  CC:  has been eating right and everything but he can't see to lose any weight and his blood sugars have been high.  History of Present Illness: Reports  that he has not been doing very ell. He states that his blood sugars remain uncontrolled and he finds this frustrating. He again asks that i take over management of his blood sugars , and of course I remind him that he sti;ll reports uncontrolled blood sugars.  Denies recent fever or chills. Denies sinus pressure, nasal congestion , ear pain or sore throat. Denies chest congestion, or cough productive of sputum. Denies chest pain, palpitations, PND, orthopnea or leg swelling. Denies abdominal pain, nausea, vomitting, diarrhea or constipation. Denies change in bowel movements or bloody stool. Denies dysuria , frequency, incontinence or hesitancy. he reports chronic joint pain with reduced mobility and stiffness Denies headaches, vertigo, seizures. Denies depression, anxiety or insomnia.Reports a good response to fluoxetine, and will continue this Denies  rash, lesions, or itch.      Current Medications (verified): 1)  Aspirin 81 Mg  Tbec (Aspirin) .Marland Kitchen.. 1 By Mouth Once Daily 2)  Furosemide 80 Mg Tabs (Furosemide) .... Take 1 Tablet By Mouth Two Times A Day 3)  Catapres-Tts-2 0.2 Mg/24hr Ptwk (Clonidine Hcl) .... Apply One  Patch Every 7 Days 4)  Labetalol Hcl 300 Mg Tabs (Labetalol Hcl) .... One and One Half Tabs By Mouth Bid 5)  Diovan 160 Mg Tabs (Valsartan) .... Take 1 Tablet By Mouth Two Times A Day 6)  Klor-Con M20 20 Meq Cr-Tabs (Potassium Chloride Crys Cr) .... Take 1 Tablet By Mouth Two Times A Day 7)  Amlodipine Besylate 10 Mg Tabs (Amlodipine Besylate) .... Take 1 Tab Daily 8)  Lantus Solostar 100 Unit/ml Soln (Insulin Glargine) .... Inject 12 Units Subcutaneously Once Daily 9)  Novolog 100 Unit/ml Soln (Insulin Aspart) .... Sliding Scale 10)  Simvastatin 20 Mg Tabs (Simvastatin) .... One Tab By Mouth At Bedtime 11)  Fexofenadine Hcl 180 Mg Tabs (Fexofenadine Hcl) .... Take 1 Daily For Allergies 12)  Fluoxetine Hcl 10 Mg Caps (Fluoxetine Hcl) .... Take 1 Capsule By Mouth Once A Day  Allergies (verified): 1)  Ace Inhibitors  Review of Systems      See HPI General:  Complains of fatigue. Eyes:  Denies blurring and discharge. Endo:  Complains of excessive thirst and excessive urination. Heme:  Denies abnormal bruising and bleeding. Allergy:  Denies hives or rash and itching eyes.  Physical Exam  General:  Well-developed,well-nourished,in no acute distress; alert,appropriate and cooperative throughout examination HEENT: No facial asymmetry,  EOMI, No sinus tenderness, TM's Clear, oropharynx  pink and moist.   Chest: Clear to auscultation  bilaterally.  CVS: S1, S2, No murmurs, No S3.   Abd: Soft, Nontender.  MS: Adequate ROM spine, hips, shoulders and knees.  Ext: No edema.   CNS: CN 2-12 intact, power tone and sensation normal throughout.   Skin: Intact, no visible lesions or rashes.  Psych: Good eye contact, normal affect.  Memory intact,not anxious or  depressed appearing.    Impression & Recommendations:  Problem # 1:  DEPRESSION (ICD-311) Assessment Improved  His updated medication list for this problem includes:    Fluoxetine Hcl 10 Mg Caps (Fluoxetine hcl) .Marland Kitchen... Take 1 capsule by  mouth once a day  Problem # 2:  ALLERGIC RHINITIS (ICD-477.9) Assessment: Improved  His updated medication list for this problem includes:    Fexofenadine Hcl 180 Mg Tabs (Fexofenadine hcl) .Marland Kitchen... Take 1 daily for allergies  Problem # 3:  HYPERTENSION (ICD-401.9) Assessment: Unchanged  His updated medication list for this problem includes:    Furosemide 80 Mg Tabs (Furosemide) .Marland Kitchen... Take 1 tablet by mouth two times a day    Catapres-tts-2 0.2 Mg/24hr Ptwk (Clonidine hcl) .Marland Kitchen... Apply one patch every 7 days    Labetalol Hcl 300 Mg Tabs (Labetalol hcl) ..... One and one half tabs by mouth bid    Diovan 160 Mg Tabs (Valsartan) .Marland Kitchen... Take 1 tablet by mouth two times a day    Amlodipine Besylate 10 Mg Tabs (Amlodipine besylate) .Marland Kitchen... 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 1 by mouth once daily    Diovan 160 Mg Tabs (Valsartan) .Marland Kitchen... Take 1 tablet by mouth two times a day    Lantus Solostar 100 Unit/ml Soln (Insulin glargine) ..... Inject 12 units subcutaneously once daily    Novolog 100 Unit/ml Soln (Insulin aspart) ..... Sliding scale  Orders: Glucose, (CBG) [phone])  Labs Reviewed: Creat: 2.28 (04/20/2009)    Reviewed HgBA1c results: 7.7 (03/26/2009)  8.1 (11/28/2008)  Problem # 5:  OBESITY,MILD (ICD-278.00) Assessment: Unchanged  Ht: 70 (09/08/2009)   Wt: 233 (09/08/2009)   BMI: 33.55 (09/08/2009)  Complete Medication List: 1)  Aspirin 81 Mg Tbec (Aspirin) .Marland Kitchen.. 1 by mouth once daily 2)  Furosemide 80 Mg Tabs (Furosemide) .... Take 1 tablet by mouth two  times a day 3)  Catapres-tts-2 0.2 Mg/24hr Ptwk (Clonidine hcl) .... Apply one patch every 7 days 4)  Labetalol Hcl 300 Mg Tabs (Labetalol hcl) .... One and one half tabs by mouth bid 5)  Diovan 160 Mg Tabs (Valsartan) .... Take 1 tablet by mouth two times a day 6)  Klor-con M20 20 Meq Cr-tabs (Potassium chloride crys cr) .... Take 1 tablet by mouth two times a day 7)  Amlodipine Besylate 10 Mg Tabs (Amlodipine besylate) .... Take 1 tab daily 8)  Lantus Solostar 100 Unit/ml Soln (Insulin glargine) .... Inject 12 units subcutaneously once daily 9)  Novolog 100 Unit/ml Soln (Insulin aspart) .... Sliding scale 10)  Fexofenadine Hcl 180 Mg Tabs (Fexofenadine hcl) .... Take 1 daily for allergies 11)  Fluoxetine Hcl 10 Mg Caps (Fluoxetine hcl) .... Take 1 capsule by  mouth once a day 12)  Pravastatin Sodium 40 Mg Tabs (Pravastatin sodium) .... Take 1 tab by mouth at bedtime  Patient Instructions: 1)  Please schedule a follow-up appointment in 3 months. 2)  It is important that you exercise regularly at least 30 minutes 5 times a week. If you develop chest pain, have severe difficulty breathing, or feel very tired , stop exercising immediately and seek medical attention. 3)  You need to lose weight. Consider a lower calorie diet and regular exercise. pls reduce your carb intake 4)  pLs continue to follow closely with the endocrinologist until your sugars normalise. 5)  PLS stop the simvastatin, instead use pravstatin due to drug interaction. 6)  I am glad that you are doing better as far as depression, continue the meds Prescriptions: PRAVASTATIN SODIUM 40 MG TABS (PRAVASTATIN SODIUM) Take 1 tab by mouth at bedtime  #30 x 3   Entered and Authorized by:   Syliva Overman MD   Signed by:   Syliva Overman MD on 09/08/2009   Method used:   Printed then faxed to ...       Hospital doctor (retail)       [address]       [place], [place]  [address]       Ph:  [phone] or [phone]       Fax: [phone]   RxID:   [id number]    Orders Added: 1)  Glucose, (CBG) [82962] 2)  Est. Patient Level IV [[address]]   Laboratory Results   Blood Tests     Glucose (random): 104 mg/dL   (Normal Range: 66-440)

## 2010-02-18 NOTE — Letter (Signed)
Summary: outpatient nutritional care  outpatient nutritional care   Imported By: Lind Guest 02/10/2010 11:37:02  _____________________________________________________________________  External Attachment:    Type:   Image     Comment:   External Document

## 2010-02-18 NOTE — Miscellaneous (Signed)
Summary: LABS CMP,A1C 11/28/2008  Clinical Lists Changes  Observations: Added new observation of CALCIUM: 8.8 mg/dL (16/10/9602 54:09) Added new observation of ALBUMIN: 3.9 g/dL (81/19/1478 29:56) Added new observation of PROTEIN, TOT: 6.1 g/dL (21/30/8657 84:69) Added new observation of SGPT (ALT): 21 units/L (11/28/2008 15:35) Added new observation of SGOT (AST): 14 units/L (11/28/2008 15:35) Added new observation of ALK PHOS: 73 units/L (11/28/2008 15:35) Added new observation of CREATININE: 2.01 mg/dL (62/95/2841 32:44) Added new observation of BUN: 28 mg/dL (01/19/7251 66:44) Added new observation of BG RANDOM: 208 mg/dL (03/47/4259 56:38) Added new observation of CO2 PLSM/SER: 22 meq/L (11/28/2008 15:35) Added new observation of CL SERUM: 104 meq/L (11/28/2008 15:35) Added new observation of K SERUM: 4.0 meq/L (11/28/2008 15:35) Added new observation of NA: 141 meq/L (11/28/2008 15:35) Added new observation of HGBA1C: 8.1 % (11/28/2008 15:35)

## 2010-02-18 NOTE — Letter (Signed)
Summary: [place] [facility] Work Engineer, agricultural at [employer]  [address] [place] [address]   Phone: [phone]  Fax: [phone]     January 14, 2010 MRN: [id number]   TRAYVOND VIETS [address] [place], [place]  [address]      YOUR LAB WORK IS DUE   February 25, 2010  Please go to [facility], located across the street from [facility] on the second floor.  Hours are Monday - Friday 7am until 7:30pm         Saturday 8am until 12noon      _X_ YOUR LABWORK IS NOT FASTING --YOU MAY EAT PRIOR TO LABWORK

## 2010-02-18 NOTE — Assessment & Plan Note (Signed)
Summary: FLU SHOT  Nurse Visit   Allergies: 1)  Ace Inhibitors  Immunizations Administered:  Influenza Vaccine # 1:    Vaccine Type: Fluvax Non-MCR    Site: right deltoid    Mfr: novartis    Dose: 0.5 ml    Route: IM    Given by: Adella Hare LPN    Exp. Date: 05/2010    Lot #: [id number]    VIS given: 08/11/09 version given October 19, 2009.  Orders Added: 1)  Influenza Vaccine NON MCR [00028]

## 2010-02-18 NOTE — Letter (Signed)
Summary: Patient Notice, Colon Biopsy Results  [facility]  [address]   [place], [place] [address]   Phone: [phone]  Fax: [phone]       January 19, 2010   [name] [address], [place]  [address] [birth year]    Dear [name],  I am pleased to inform you that the biopsies taken during your recent colonoscopy did not show any evidence of cancer upon pathologic examination.  Additional information/recommendations:  No further action is needed at this time.  Please follow-up with your primary care physician for your other healthcare needs.  You should have a repeat colonoscopy examination  in 5 years.  Please call us if you are having persistent problems or have questions about your condition that have not been fully answered at this time.  Sincerely,    R. Roetta Sessions MD, FACP [facility] Ph: [phone]    Fax: [phone]   Appended Document: Patient Notice, Colon Biopsy Results letter mailed to pt  Appended Document: Patient Notice, Colon Biopsy Results REMINDER IN COMPUTER

## 2010-02-18 NOTE — Letter (Signed)
Summary: DR. Patrecia Pace  DR. MORAYATI   Imported By: Lind Guest 09/11/2009 09:53:58  _____________________________________________________________________  External Attachment:    Type:   Image     Comment:   External Document

## 2010-02-18 NOTE — Letter (Signed)
Summary: [facility]   Imported By: Lind Guest 09/15/2009 09:04:06  _____________________________________________________________________  External Attachment:    Type:   Image     Comment:   External Document

## 2010-02-18 NOTE — Assessment & Plan Note (Signed)
Summary: 1 wk f/u per checkut on 10/16/09/tg   Visit Type:  Follow-up Primary Provider:  Dr. Syliva Overman   History of Present Illness: [name] is here today on follow-up after being seen one week ago after a wt gain of 10 lbs in 3 days.  He has a history of Diastolic CHF, CKD, hypertension, and Diabetes.  On that last visit, he was asked to increase his lasix dose to an additional 80mg  daily at noon for 3 days and continue current 120mg  two times a day lasix dose. He was to have follow-up BMET.  He was advised on low sodium diet, and to take lasix on empty stomach.  He states that he feels some better, and has been consistant on weighing daily and has not been eating any foods with salt.  He is eating fruits and vegatables more and has only a complaint of intestinal gas.   Current Medications (verified): 1)  Aspirin 81 Mg  Tbec (Aspirin) .Marland Kitchen.. 1 By Mouth Once Daily 2)  Furosemide 80 Mg Tabs (Furosemide) .... Take2  Tablet By Mouth Two Times A Day 3)  Catapres-Tts-2 0.2 Mg/24hr Ptwk (Clonidine Hcl) .... Apply One Patch Every 7 Days 4)  Labetalol Hcl 300 Mg Tabs (Labetalol Hcl) .... Take 2 Tabs Three Times A Day 5)  Diovan 160 Mg Tabs (Valsartan) .... Take 1 Tablet By Mouth Two Times A Day 6)  Klor-Con M20 20 Meq Cr-Tabs (Potassium Chloride Crys Cr) .... Take 1 Tablet By Mouth Two Times A Day 7)  Amlodipine Besylate 10 Mg Tabs (Amlodipine Besylate) .... Take 1 Tab Daily 8)  Lantus Solostar 100 Unit/ml Soln (Insulin Glargine) .... Inject20  Units Subcutaneously Pm 9)  Novolog 100 Unit/ml Soln (Insulin Aspart) .... Sliding Scale 10)  Pravastatin Sodium 40 Mg Tabs (Pravastatin Sodium) .... Take 1 Tab By Mouth At Bedtime 11)  Calcitriol 0.25 Mcg Caps (Calcitriol) .... Take 1 Tab Daily  Allergies (verified): 1)  Ace Inhibitors  Comments:  Nurse/Medical Assistant: patient reviewed previous med list from previous ov and stated meds was correct  Review of Systems       bloating and  gas.  All other systems have been reviewed and are negative unless stated above.   Vital Signs:  Patient profile:   65 year old male Weight:      247 pounds Pulse rate:   70 / minute BP sitting:   147 / 52  (right arm)  Vitals Entered By: Dreama Saa, CNA (October 22, 2009 1:02 PM)  Physical Exam  General:  Well developed, well nourished, in no acute distress.obese.   Lungs:  Clear bilaterally to auscultation and percussion. Heart:  Soft systolic murmur. Otherwise NSR. Abdomen:  Obese with normal bowel sounds. Msk:  Back normal, normal gait. Muscle strength and tone normal. Pulses:  pulses normal in all 4 extremities Extremities:  No clubbing or cyanosis. Neurologic:  Alert and oriented x 3. Psych:  Normal affect.   Impression & Recommendations:  Problem # 1:  ACUTE DIASTOLIC HEART FAILURE (ICD-428.31) [name] has lost 7 lbs since last being seen in the office and is feeling somewhat better. He is eating better and is without complaint.  Review of follow-up labs post extra diureses demonstrated minimal change in creat from 2.2 to 2.5.  BUN from 31-32.  K+ from 3.9-4.0.  We will continue current medications and I have encouraged him to continue dietary compliance. Wil see him in 3 months unless he becomes symtptomatic or experiencing  wt gain. His updated medication list for this problem includes:    Aspirin 81 Mg Tbec (Aspirin) .Marland Kitchen... 1 by mouth once daily    Furosemide 80 Mg Tabs (Furosemide) .Marland Kitchen... Take2  tablet by mouth two times a day    Labetalol Hcl 300 Mg Tabs (Labetalol hcl) .Marland Kitchen... Take 2 tabs three times a day    Diovan 160 Mg Tabs (Valsartan) .Marland Kitchen... Take 1 tablet by mouth two times a day    Amlodipine Besylate 10 Mg Tabs (Amlodipine besylate) .Marland Kitchen... Take 1 tab daily  Problem # 2:  HYPERTENSION (ICD-401.9) Better controlled on this visit.  Will continue to monitor and adjust medications as necessary.  He will continue with [facility] for renal evaluation. His updated  medication list for this problem includes:    Aspirin 81 Mg Tbec (Aspirin) .Marland Kitchen... 1 by mouth once daily    Furosemide 80 Mg Tabs (Furosemide) .Marland Kitchen... Take2  tablet by mouth two times a day    Catapres-tts-2 0.2 Mg/24hr Ptwk (Clonidine hcl) .Marland Kitchen... Apply one patch every 7 days    Labetalol Hcl 300 Mg Tabs (Labetalol hcl) .Marland Kitchen... Take 2 tabs three times a day    Diovan 160 Mg Tabs (Valsartan) .Marland Kitchen... Take 1 tablet by mouth two times a day    Amlodipine Besylate 10 Mg Tabs (Amlodipine besylate) .Marland Kitchen... Take 1 tab daily  Patient Instructions: 1)  Your physician recommends that you schedule a follow-up appointment in: 3 months 2)  Your physician recommends that you continue on your current medications as directed. Please refer to the Current Medication list given to you today.

## 2010-02-24 ENCOUNTER — Encounter (INDEPENDENT_AMBULATORY_CARE_PROVIDER_SITE_OTHER): Payer: Self-pay | Admitting: *Deleted

## 2010-02-24 LAB — CONVERTED CEMR LAB
Chloride: 100 meq/L
Creatinine, Ser: 2.59 mg/dL
Potassium: 4 meq/L
Sodium: 138 meq/L

## 2010-02-24 NOTE — Letter (Signed)
Summary: [facility]   Imported By: Lind Guest 02/17/2010 10:18:19  _____________________________________________________________________  External Attachment:    Type:   Image     Comment:   External Document

## 2010-02-24 NOTE — Assessment & Plan Note (Signed)
**Note De-Identified Abdulahi Schor Obfuscation** Summary: 1 mth nurse visit per checkout on 01/14/10/tg  Nurse Visit   Vital Signs:  Patient profile:   65 year old male Weight:      251 pounds Pulse rate:   69 / minute BP sitting:   154 / 59  (left arm)  Vitals Entered By: Larita Fife Mark Benecke LPN (February 15, 2010 3:38 PM)  Referring Provider:  Neurology-Dr. Gerilyn Pilgrim Primary Provider:  Dr. Syliva Overman   History of Present Illness: S: Pt. arrives in office for BP check with nurse. B: On last OV with Dr. Dietrich Pates on 01-14-10 pt. was advised to change Torsemide to 100mg  once daily (if weight increases 5lbs. add 1/2 tablet at 1:00pm until weight is back to baseline), Hytrin 10mg  (take 1/2 tablet daily X 1 week then increase to a whole tablet daily) A: Pt. brought his BP diary (scanned into chart) but did not bring meds to visit, he states that all of his meds are unchanged since last OV. BP today is 154/59 and on 01-14-10 BP was 187/69. Pt has gained 5 lbs. since last visit (246lbs. to 251lbs.) he denies swelling in hands and feet but states he has tightness in his stomach, otherwise he has no complaints at this time.  R: Pt. advised we will call him with Dr. Marvel Plan recommendations, if any.  02/16/10  He should be taking extra 1/2 dose of torsemide since wt. has increased 5 lbs. until back to baseline. Increase hytrin to 20 mg once daily Home BPs RN visit 1 month  Orleans Bing, M.D.   [facility].      Larita Fife Eduardo Wurth LPN  February 16, 2010 11:47 AM   Pt. advised and states he understands instructions given. RX for Hytrin 20mg  faxed to [facility]. Pt. will monitor BP daily and bring diary to his F/U with Dr. Dietrich Pates. Pt. did not schedule a 1 month nurse visit due to having an appt. scheduled with Dr. Dietrich Pates on 03-17-10.  Larita Fife Nicoletta Hush LPN  February 16, 2010 4:31 PM    Current Medications (verified): 1)  Aspirin 81 Mg  Tbec (Aspirin) .Marland Kitchen.. 1 By Mouth Once Daily 2)  Torsemide 100 Mg Tabs (Torsemide) .... Take One Tablet By Mouth Daily (If You  Gain >5lbs Take 1/2 Tablet (50mg ) At 1pm Until Wt. Is At Baseline) 3)  Catapres-Tts-2 0.2 Mg/24hr Ptwk (Clonidine Hcl) .... Apply One Patch Every 7 Days 4)  Labetalol Hcl 300 Mg Tabs (Labetalol Hcl) .... Take 2 Tabs Three Times A Day 5)  Diovan 320 Mg Tabs (Valsartan) .... Take 1 Tablet By Mouth Once A Day ' 6)  Klor-Con M20 20 Meq Cr-Tabs (Potassium Chloride Crys Cr) .... Take 1 Tablet By Mouth Two Times A Day 7)  Amlodipine Besylate 10 Mg Tabs (Amlodipine Besylate) .... Take 1 Tab Daily 8)  Lantus Solostar 100 Unit/ml Soln (Insulin Glargine) .... Inject20  Units Subcutaneously Pm 9)  Novolog 100 Unit/ml Soln (Insulin Aspart) .... Sliding Scale 10)  Calcitriol 0.25 Mcg Caps (Calcitriol) .... Take 1 Tab Daily 11)  Colace 100 Mg Caps (Docusate Sodium) .Marland Kitchen.. 1 By Mouth Two Times A Day 12)  Miralax  Powd (Polyethylene Glycol 3350) .Marland Kitchen.. 1 Capful Daily As Needed For Constipation 13)  Terazosin Hcl 10 Mg Caps (Terazosin Hcl) .... Take 2 Tablets By Mouth Once Daily  Allergies (verified): 1)  Ace Inhibitors Prescriptions: TERAZOSIN HCL 10 MG CAPS (TERAZOSIN HCL) take 2 tablets by mouth once daily  #60 x 3   Entered by:   Larita Fife Kelsie Kramp LPN **Note De-Identified Charnell Peplinski Obfuscation** Authorized by:   Kathlen Brunswick, MD, [facility]   Signed by:   Larita Fife Loys Shugars LPN on 16/10/9602   Method used:   F[facility]ed to ...       [facility]       [address]       [place], [place]  [address]       Ph: [phone] or [phone]       Fax: [phone]   RxID:   [id number]

## 2010-03-01 LAB — CONVERTED CEMR LAB
Chloride: 100 meq/L (ref 96–112)
Creatinine, Ser: 2.59 mg/dL — ABNORMAL HIGH (ref 0.40–1.50)
Potassium: 4 meq/L (ref 3.5–5.3)

## 2010-03-04 NOTE — Letter (Signed)
Summary: BP LOG  BP LOG   Imported By: Faythe Ghee 02/16/2010 15:40:50  _____________________________________________________________________  External Attachment:    Type:   Image     Comment:   External Document

## 2010-03-10 ENCOUNTER — Ambulatory Visit: Payer: MEDICARE | Admitting: Family Medicine

## 2010-03-11 ENCOUNTER — Encounter: Payer: Self-pay | Admitting: Cardiology

## 2010-03-15 ENCOUNTER — Ambulatory Visit: Payer: MEDICARE

## 2010-03-16 ENCOUNTER — Encounter (INDEPENDENT_AMBULATORY_CARE_PROVIDER_SITE_OTHER): Payer: Self-pay | Admitting: *Deleted

## 2010-03-16 NOTE — Letter (Signed)
Summary: BP LOG  BP LOG   Imported By: Faythe Ghee 03/11/2010 10:08:11  _____________________________________________________________________  External Attachment:    Type:   Image     Comment:   External Document

## 2010-03-17 ENCOUNTER — Ambulatory Visit (INDEPENDENT_AMBULATORY_CARE_PROVIDER_SITE_OTHER): Payer: 59 | Admitting: Family Medicine

## 2010-03-17 ENCOUNTER — Ambulatory Visit: Payer: MEDICARE | Admitting: Cardiology

## 2010-03-17 ENCOUNTER — Encounter: Payer: Self-pay | Admitting: Family Medicine

## 2010-03-17 ENCOUNTER — Encounter: Payer: Self-pay | Admitting: Cardiology

## 2010-03-17 DIAGNOSIS — I10 Essential (primary) hypertension: Secondary | ICD-10-CM

## 2010-03-17 DIAGNOSIS — I359 Nonrheumatic aortic valve disorder, unspecified: Secondary | ICD-10-CM

## 2010-03-17 DIAGNOSIS — R609 Edema, unspecified: Secondary | ICD-10-CM

## 2010-03-18 ENCOUNTER — Telehealth: Payer: Self-pay | Admitting: Family Medicine

## 2010-03-22 LAB — CONVERTED CEMR LAB
BUN: 25 mg/dL — ABNORMAL HIGH (ref 6–23)
CO2: 25 meq/L (ref 19–32)
Chloride: 104 meq/L (ref 96–112)
Potassium: 3.9 meq/L (ref 3.5–5.3)

## 2010-03-24 ENCOUNTER — Encounter: Payer: Self-pay | Admitting: *Deleted

## 2010-03-25 NOTE — Progress Notes (Signed)
Summary: 5pound weight loss  Phone Note Call from Patient   Summary of Call: pt said lasix shot worked. and he lost 5pounds. [phone] shot works quicker than pills Initial call taken by: Rudene Anda,  March 18, 2010 3:36 PM  Follow-up for Phone Call        glad to hear he is better Follow-up by: Syliva Overman MD,  March 18, 2010 5:24 PM  Additional Follow-up for Phone Call Additional follow up Details #1::        called patient, no answer Additional Follow-up by: Adella Hare LPN,  March 19, 2010 2:22 PM

## 2010-03-25 NOTE — Miscellaneous (Signed)
Summary: ECHO 01/19/2010  Clinical Lists Changes  Observations: Added new observation of ECHOINTERP:  Study Conclusions    - Left ventricle: The cavity size was at the upper limits of normal.     Wall thickness was increased in a pattern of mild tp moderate LVH     with disproportionate septal hypertrophy. Systolic function was     normal. The estimated ejection fraction was in the range of 55% to     60%. Wall motion was normal; there were no regional wall motion     abnormalities.   - Aortic valve: Mildly calcified annulus. Trileaflet. Mild     regurgitation.   - Mitral valve: Calcified annulus.   - Left atrium: The atrium was mildly dilated.   - Atrial septum: No defect or patent foramen ovale was identified.   Impressions:  (01/19/2010 16:34)      Echocardiogram  Procedure date:  01/19/2010  Findings:       Study Conclusions    - Left ventricle: The cavity size was at the upper limits of normal.     Wall thickness was increased in a pattern of mild tp moderate LVH     with disproportionate septal hypertrophy. Systolic function was     normal. The estimated ejection fraction was in the range of 55% to     60%. Wall motion was normal; there were no regional wall motion     abnormalities.   - Aortic valve: Mildly calcified annulus. Trileaflet. Mild     regurgitation.   - Mitral valve: Calcified annulus.   - Left atrium: The atrium was mildly dilated.   - Atrial septum: No defect or patent foramen ovale was identified.   Impressions:

## 2010-03-25 NOTE — Assessment & Plan Note (Signed)
Summary: f up   Vital Signs:  Patient profile:   65 year old male Height:      70 inches Weight:      255 pounds BMI:     36.72 O2 Sat:      95 % Pulse rate:   76 / minute Pulse rhythm:   regular Resp:     16 per minute BP sitting:   148 / 58  (left arm) Cuff size:   large  Vitals Entered By: Everitt Amber LPN (March 17, 2010 1:44 PM)  Nutrition Counseling: [name] BMI is greater than 25 and therefore counseled on weight management options. CC: c/o having severe constipation. Only having BM once a week if that. No pain or nausea   Primary Care Provider:  Dr. Syliva Overman  CC:  c/o having severe constipation. Only having BM once a week if that. No pain or nausea.  History of Present Illness: Pt concerned about swelling of his legs and weight gain in the past several weeks. He denies chest pain, he does have some fatigue and exertional; dyspnea. He uses 2 pillows. He never did see a therapist as regards his mental health, and has not  taken the antidepresant  prescribed, states he no longer wants either course of treatment. He denies any fevr or chills. He denies head or chest congestion, has no nasal drainage or cough productive of sputum. He continually c/o "having too many doctors, and wants to drop some" I advised he needs to stay with the heart dioc, but if his blood sugars are controlled, I woould follow that unless new probs developed.   Preventive Screening-Counseling & Management  Alcohol-Tobacco     Smoking Cessation Counseling: yes  Current Medications (verified): 1)  Aspirin 81 Mg  Tbec (Aspirin) .Marland Kitchen.. 1 By Mouth Once Daily 2)  Torsemide 100 Mg Tabs (Torsemide) .... Take One Tablet By Mouth Daily (If You Gain >5lbs Take 1/2 Tablet (50mg ) At 1pm Until Wt. Is At Baseline) 3)  Catapres-Tts-2 0.2 Mg/24hr Ptwk (Clonidine Hcl) .... Apply One Patch Every 7 Days 4)  Labetalol Hcl 300 Mg Tabs (Labetalol Hcl) .... Take 2 Tabs Three Times A Day 5)  Diovan 160 Mg Tabs  (Valsartan) .... Take One Tablet By Mouth Daily 6)  Klor-Con M20 20 Meq Cr-Tabs (Potassium Chloride Crys Cr) .... Take 1 Tablet By Mouth Two Times A Day 7)  Amlodipine Besylate 10 Mg Tabs (Amlodipine Besylate) .... Take 1 Tab Daily 8)  Lantus Solostar 100 Unit/ml Soln (Insulin Glargine) .... Inject20  Units Subcutaneously Pm 9)  Novolog 100 Unit/ml Soln (Insulin Aspart) .... Sliding Scale 10)  Calcitriol 0.25 Mcg Caps (Calcitriol) .... Take 1 Tab Daily 11)  Terazosin Hcl 10 Mg Caps (Terazosin Hcl) .... Take 2 Tablets By Mouth Once Daily  Allergies (verified): 1)  Ace Inhibitors  Review of Systems      See HPI General:  Complains of fatigue. Eyes:  Denies discharge, eye pain, and red eye. ENT:  Denies earache, postnasal drainage, and sinus pressure. CV:  Complains of shortness of breath with exertion, swelling of feet, and weight gain; denies chest pain or discomfort and difficulty breathing while lying down. Resp:  Denies cough and sputum productive. GI:  Denies abdominal pain, constipation, diarrhea, nausea, and vomiting. GU:  Complains of erectile dysfunction; denies dysuria and urinary frequency. MS:  Complains of joint pain. Psych:  Complains of depression; denies anxiety, mental problems, suicidal thoughts/plans, thoughts of violence, and unusual visions or sounds. Endo:  Denies cold intolerance, excessive thirst, excessive urination, and heat intolerance. Allergy:  Denies hives or rash and itching eyes.  Physical Exam  General:  Well-developed,obese,in no acute distress; alert,appropriate and cooperative throughout examination HEENT: No facial asymmetry,  EOMI, No sinus tenderness, TM's Clear, oropharynx  pink and moist.   Chest: Clear to auscultation bilaterally.  CVS: S1, S2, systolic murmur, No S3.   Abd: Soft, Nontender. Obese MS: Adequate though  ROM spine, hips, shoulders and knees.  Ext:one plus  edema bilaterally.   CNS: CN 2-12 intact, power tone and sensation normal  throughout.   Skin: Intact, no visible lesions or rashes.  Psych: Good eye contact, normal affect.  Memory intact, not anxious or depressed appearing.    Impression & Recommendations:  Problem # 1:  EDEMA (ICD-782.3) Assessment Comment Only  His updated medication list for this problem includes:    Torsemide 100 Mg Tabs (Torsemide) .Marland Kitchen... Take one tablet by mouth daily (if you gain >5lbs take 1/2 tablet (50mg ) at 1pm until wt. is at baseline)  Orders: Furosemide- Lasix Injection (J1940) Admin of Therapeutic Inj  intramuscular or subcutaneous ([address])  Problem # 2:  CONGESTIVE HEART FAILURE-NORMAL EF (ICD-428.0) Assessment: Deteriorated  His updated medication list for this problem includes:    Aspirin 81 Mg Tbec (Aspirin) .Marland Kitchen... 1 by mouth once daily    Torsemide 100 Mg Tabs (Torsemide) .Marland Kitchen... Take one tablet by mouth daily (if you gain >5lbs take 1/2 tablet (50mg ) at 1pm until wt. is at baseline)    Labetalol Hcl 300 Mg Tabs (Labetalol hcl) .Marland Kitchen... Take 2 tabs three times a day    Diovan 160 Mg Tabs (Valsartan) .Marland Kitchen... Take one tablet by mouth daily pt has appt with cardiology today  Problem # 3:  TOBACCO ABUSE (ICD-305.1) Assessment: Deteriorated  Encouraged smoking cessation and discussed different methods for smoking cessation.   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 1 by mouth once daily    Diovan 160 Mg Tabs (Valsartan) .Marland Kitchen... Take one tablet by mouth daily    Lantus Solostar 100 Unit/ml Soln (Insulin glargine) ..... Inject20  units subcutaneously pm    Novolog 100 Unit/ml Soln (Insulin aspart) ..... Sliding scale  Orders: T- Hemoglobin A1C (60454-09811)  Labs Reviewed: Creat: 2.59 (02/24/2010)    Reviewed HgBA1c results: 6.4 (12/14/2009)  7.7 (03/26/2009)  Problem # 5:  OBESITY,MILD (ICD-278.00) Assessment:  Deteriorated  Ht: 70 (03/17/2010)   Wt: 255 (03/17/2010)   BMI: 36.72 (03/17/2010) therapeutic lifestyle change discussed and encouraged  Problem # 6:  DEPRESSION (ICD-311) Assessment: Improved  Complete Medication List: 1)  Aspirin 81 Mg Tbec (Aspirin) .Marland Kitchen.. 1 by mouth once daily 2)  Torsemide 100 Mg Tabs (Torsemide) .... Take one tablet by mouth daily (if you gain >5lbs take 1/2 tablet (50mg ) at 1pm until wt. is at baseline) 3)  Catapres-tts-2 0.2 Mg/24hr Ptwk (Clonidine hcl) .... Apply one patch every 7 days 4)  Labetalol Hcl 300 Mg Tabs (Labetalol hcl) .... Take 2 tabs three times a day 5)  Diovan 160 Mg Tabs (Valsartan) .... Take one tablet by mouth daily 6)  Klor-con M20 20 Meq Cr-tabs (Potassium chloride crys cr) .... Take 1 tablet by mouth two times a day 7)  Amlodipine Besylate 10 Mg Tabs (Amlodipine besylate) .... Take 1 tab daily 8)  Lantus Solostar 100 Unit/ml Soln (Insulin glargine) .... Inject20  units subcutaneously pm 9)  Novolog 100 Unit/ml Soln (Insulin aspart) .... Sliding scale 10)  Calcitriol 0.25 Mcg Caps (Calcitriol) .... Take 1 tab daily 11)  Terazosin Hcl 10 Mg Caps (Terazosin hcl) .... Take 2 tablets by mouth once daily  Other Orders: T-Basic Metabolic Panel [id number])  Patient Instructions: 1)  Please schedule a follow-up appointment in 4 months. 2)  Tobacco is very bad for your health and your loved ones! You Should stop smoking!. 3)  Stop Smoking Tips: Choose a Quit date. Cut down before the Quit date. decide what you will do as a substitute when you feel the urge to smoke(gum,toothpick,exercise). 4)  Lasix 20mg  IM is being injected today for fluid retention. 5)  Pls take the additional torsemide  as directed by cardiologist. 6)  There are free diabetic classes at the hospital in March   7)  BMP prior to visit, ICD-9: 8)  HbgA1C prior to visit, ICD-9:  today   Medication Administration  Injection # 1:    Medication: Furosemide- Lasix Injection     Diagnosis: EDEMA (ICD-782.3)    Route: IM    Site: RUOQ gluteus    Exp Date: 03/18/2010    Lot #: [id number]    Mfr: [email or web address]    Comments: 20mg  given    Patient tolerated injection without complications    Given by: Adella Hare LPN (March 17, 2010 3:17 PM)  Orders Added: 1)  Est. Patient Level IV [99214] 2)  T-Basic Metabolic Panel [[id number]] 3)  T- Hemoglobin A1C [83036-23375] 4)  Furosemide- Lasix Injection [J1940] 5)  Admin of Therapeutic Inj  intramuscular or subcutaneous [96372]     Medication Administration  Injection # 1:    Medication: Furosemide- Lasix Injection    Diagnosis: EDEMA (ICD-782.3)    Route: IM    Site: RUOQ gluteus    Exp Date: 03/18/2010    Lot #: [id number]    Mfr: hospira    Comments: 20mg  given    Patient tolerated injection without complications    Given by: Adella Hare LPN (March 17, 2010 3:17 PM)  Orders Added: 1)  Est. Patient Level IV [21308] 2)  T-Basic Metabolic Panel [[id number]] 3)  T- Hemoglobin A1C [83036-23375] 4)  Furosemide- Lasix Injection [J1940] 5)  Admin of Therapeutic Inj  intramuscular or subcutaneous [65784]

## 2010-03-25 NOTE — Miscellaneous (Signed)
Summary: LABS BMP 02/24/2010  Clinical Lists Changes  Observations: Added new observation of CALCIUM: 8.9 mg/dL (21/30/8657 84:69) Added new observation of CREATININE: 2.59 mg/dL (62/95/2841 32:44) Added new observation of BUN: 33 mg/dL (01/19/7251 66:44) Added new observation of BG RANDOM: 121 mg/dL (03/47/4259 56:38) Added new observation of CO2 PLSM/SER: 27 meq/L (02/24/2010 16:31) Added new observation of CL SERUM: 100 meq/L (02/24/2010 16:31) Added new observation of K SERUM: 4.0 meq/L (02/24/2010 16:31) Added new observation of NA: 138 meq/L (02/24/2010 16:31)

## 2010-03-29 LAB — GLUCOSE, CAPILLARY: Glucose-Capillary: 87 mg/dL (ref 70–99)

## 2010-04-06 NOTE — Miscellaneous (Signed)
Summary: OV rescheduled-inability to determine medications   Referring Provider:  Neurology-Dr. Gerilyn Pilgrim Primary Provider:  Dr. Syliva Overman   History of Present Illness: Office visit rescheduled as a result of patient's failure to bring medications with him and staff inability to determine which medications he is actually taking.   Current Medications (verified): 1)  Aspirin 81 Mg  Tbec (Aspirin) .Marland Kitchen.. 1 By Mouth Once Daily 2)  Torsemide 100 Mg Tabs (Torsemide) .... Take One Tablet By Mouth Daily (If You Gain >5lbs Take 1/2 Tablet (50mg ) At 1pm Until Wt. Is At Baseline) 3)  Catapres-Tts-2 0.2 Mg/24hr Ptwk (Clonidine Hcl) .... Apply One Patch Every 7 Days 4)  Labetalol Hcl 300 Mg Tabs (Labetalol Hcl) .... Take 2 Tabs Three Times A Day 5)  Diovan 160 Mg Tabs (Valsartan) .... Take One Tablet By Mouth Daily 6)  Klor-Con M20 20 Meq Cr-Tabs (Potassium Chloride Crys Cr) .... Take 1 Tablet By Mouth Two Times A Day 7)  Amlodipine Besylate 10 Mg Tabs (Amlodipine Besylate) .... Take 1 Tab Daily 8)  Lantus Solostar 100 Unit/ml Soln (Insulin Glargine) .... Inject20  Units Subcutaneously Pm 9)  Novolog 100 Unit/ml Soln (Insulin Aspart) .... Sliding Scale 10)  Calcitriol 0.25 Mcg Caps (Calcitriol) .... Take 1 Tab Daily 11)  Terazosin Hcl 10 Mg Caps (Terazosin Hcl) .... Take 2 Tablets By Mouth Once Daily  Allergies (verified): 1)  Ace Inhibitors  Vital Signs:  Patient profile:   65 year old male Weight:      254 pounds Pulse rate:   63 / minute BP sitting:   168 / 60  (left arm)  Vitals Entered ByLarita Fife Via LPN (March 17, 2010 3:37 PM)   Prevention & Chronic Care Immunizations   Influenza vaccine: Fluvax Non-MCR  (10/19/2009)    Tetanus booster: 09/15/2008: Tdap    Pneumococcal vaccine: Pneumovax  (09/15/2008)    H. zoster vaccine: Not documented  Colorectal Screening   Hemoccult: Not documented    Colonoscopy: polyps  (08/23/2004)  Other Screening   PSA: 0.83   (03/26/2009)   Smoking status: current  (11/24/2008)   Smoking cessation counseling: yes  (12/18/2009)  Diabetes Mellitus   HgbA1C: 6.4  (12/14/2009)    Eye exam: Not documented    Foot exam: yes  (03/26/2009)   High risk foot: Not documented   Foot care education: Not documented    Urine microalbumin/creatinine ratio: 117.9  (04/20/2007)  Lipids   Total Cholesterol: 185  (03/26/2009)   LDL: 113  (03/26/2009)   LDL Direct: Not documented   HDL: 58  (03/26/2009)   Triglycerides: 69  (03/26/2009)  Hypertension   Last Blood Pressure: 168 / 60  (03/17/2010)   Serum creatinine: 2.59  (02/24/2010)   Serum potassium 4.0  (02/24/2010)  Self-Management Support :    Diabetes self-management support: Not documented    Hypertension self-management support: Not documented

## 2010-04-16 ENCOUNTER — Ambulatory Visit (INDEPENDENT_AMBULATORY_CARE_PROVIDER_SITE_OTHER): Payer: MEDICARE | Admitting: Cardiology

## 2010-04-16 ENCOUNTER — Encounter: Payer: Self-pay | Admitting: Cardiology

## 2010-04-16 DIAGNOSIS — I359 Nonrheumatic aortic valve disorder, unspecified: Secondary | ICD-10-CM | POA: Insufficient documentation

## 2010-04-16 DIAGNOSIS — I5031 Acute diastolic (congestive) heart failure: Secondary | ICD-10-CM

## 2010-04-16 DIAGNOSIS — N184 Chronic kidney disease, stage 4 (severe): Secondary | ICD-10-CM

## 2010-04-16 DIAGNOSIS — I509 Heart failure, unspecified: Secondary | ICD-10-CM

## 2010-04-16 DIAGNOSIS — G4730 Sleep apnea, unspecified: Secondary | ICD-10-CM

## 2010-04-16 DIAGNOSIS — F17200 Nicotine dependence, unspecified, uncomplicated: Secondary | ICD-10-CM

## 2010-04-16 DIAGNOSIS — I10 Essential (primary) hypertension: Secondary | ICD-10-CM

## 2010-04-16 DIAGNOSIS — R609 Edema, unspecified: Secondary | ICD-10-CM

## 2010-04-16 DIAGNOSIS — E1139 Type 2 diabetes mellitus with other diabetic ophthalmic complication: Secondary | ICD-10-CM

## 2010-04-16 NOTE — Assessment & Plan Note (Addendum)
Patient would like to discontinue use of daily insulin, if possible, as he believes that this will assist him with weight loss.  I suggested that he discuss this with Dr. Patrecia Pace, as a 2 drug regimen such as glipizide and Januvia might be adequate for him.  Patient would also like to have as many of his specialists, as possible, in [facility].

## 2010-04-16 NOTE — Assessment & Plan Note (Addendum)
Hypertension is severe, and blood pressure control is improved, but still suboptimal.  Labetalol will be increased to 900 mg b.i.d.  Due to xerostomia, his dose of clonidine cannot be increased.  Valsartan dosage was decreased as a result of progressive renal insufficiency, which improved thereafter.  If additional antihypertensive medication is required, a new agent will be necessary, perhaps verapamil.

## 2010-04-16 NOTE — Assessment & Plan Note (Signed)
Complete abstinence from tobacco use once again recommended.

## 2010-04-16 NOTE — Assessment & Plan Note (Signed)
Echocardiogram in January reveals substantial LVH with normal LV systolic function, mild aortic valve disease with mild insufficiency and no stenosis.  Continued clinical followup without any intervention is appropriate at present.

## 2010-04-16 NOTE — Progress Notes (Signed)
HPI : Mr. Sleight has done generally well since he was last seen 2 months ago.  Weight has been stable.  He describes chronic and unchanged class II exertional dyspnea without chest discomfort.  Diabetic control has been good, but he has noted weight gain since a recent increase in insulin dosage.  He generally requires either no short acting insulin or single dose of approximately 15 units in the morning for FBG greater than 150.  Blood pressure control has been good.  Unfortunately, he continues to smoke cigarettes.  Current Outpatient Prescriptions on File Prior to Visit  Medication Sig Dispense Refill  . AMLODIPINE BESYLATE PO Take 10 mg by mouth daily. Take one tab daily        . aspirin (ASPIRIN LOW DOSE) 81 MG EC tablet Take 81 mg by mouth daily.        . calcitRIOL (ROCALTROL) 0.25 MCG capsule Take 0.25 mcg by mouth daily.        . cloNIDine (CATAPRES-TTS-2) 0.2 MG/24HR Place 1 patch onto the skin once a week. Apply one patch every 7 days         . docusate sodium (COLACE) 100 MG capsule Take 100 mg by mouth 2 (two) times daily.        . insulin aspart (NOVOLOG) 100 UNIT/ML injection Inject into the skin 3 (three) times daily before meals. Sliding scale       . insulin glargine (LANTUS SOLOSTAR) 100 UNIT/ML injection Inject into the skin at bedtime. Inject 20 units subcutaneously pm       . LABETALOL HCL PO Take 900 mg by mouth 2 (two) times daily. Take 3 tablets by mouth twice daily      . polyethylene glycol (MIRALAX) powder Take 17 g by mouth daily. 1 capful daily as needed for constipation       . potassium chloride (KLOR-CON) 20 MEQ packet Take 20 mEq by mouth 2 (two) times daily. Take 1 tablet by mouth two times daily       . TERAZOSIN HCL PO Take 10 mg by mouth 2 (two) times daily.       Marland Kitchen torsemide (DEMADEX) 100 MG tablet Take 100 mg by mouth daily.        . penicillin v potassium (VEETID) 500 MG tablet Take 500 mg by mouth 3 (three) times daily.        Marland Kitchen DISCONTD: FLUoxetine (PROZAC)  10 MG tablet Take 10 mg by mouth daily.        Marland Kitchen DISCONTD: valsartan (DIOVAN) 320 MG tablet Take 160 mg by mouth daily.          Allergies  Allergen Reactions  . Ace Inhibitors     REACTION: cough      Past medical history, social history, and family history reviewed and updated.  ROS: No orthopnea, PND, syncope, abdominal pain or headache.  He notes mild chronic pedal edema.  PHYSICAL EXAM: BP 161/57  Pulse 66  Ht 5\' 10"  (1.778 m)  Wt 245 lb (111.131 kg)  BMI 35.15 kg/m2  SpO2 97% Repeat blood pressure-145/40 General-well-developed; no acute distress Body habitus-mildly obese Neck-No JVD; no carotid bruits Lungs-clear lung fields; resonant to percussion Cardiovascular-normal PMI; normal S1 and S2; grade 2/6 basilar systolic ejection murmur; grade 2/6 holodiastolic blowing murmur at the left sternal border and cardiac base. Abdomen-normal bowel sounds; soft and non-tender without masses or organomegaly Musculoskeletal-No deformities, no cyanosis or clubbing Neurologic-Normal cranial nerves; symmetric strength and tone Skin-Warm, no significant lesions  Extremities-distal pulses intact;1/2+ edema  ASSESSMENT AND PLAN:

## 2010-04-16 NOTE — Patient Instructions (Signed)
Your physician recommends that you schedule a follow-up appointment in: 9 months Your physician has recommended you make the following change in your medication:  Potassium is being taken twice daily, change labetalol to 3 tablets by mouth twice daily  Your physician has requested that you regularly monitor and record your blood pressure readings at home. Please use the same machine at the same time of day to check your readings and record them to bring to your follow-up visit.   Nurse visit in 2 months please bring bp diary to nurse visit

## 2010-04-21 LAB — DIFFERENTIAL
Basophils Absolute: 0.1 10*3/uL (ref 0.0–0.1)
Basophils Absolute: 0.1 10*3/uL (ref 0.0–0.1)
Basophils Relative: 0 % (ref 0–1)
Basophils Relative: 1 % (ref 0–1)
Basophils Relative: 1 % (ref 0–1)
Eosinophils Absolute: 0.3 10*3/uL (ref 0.0–0.7)
Eosinophils Relative: 5 % (ref 0–5)
Eosinophils Relative: 5 % (ref 0–5)
Lymphs Abs: 1.5 10*3/uL (ref 0.7–4.0)
Monocytes Absolute: 0.5 10*3/uL (ref 0.1–1.0)
Monocytes Absolute: 0.5 10*3/uL (ref 0.1–1.0)
Monocytes Absolute: 0.7 10*3/uL (ref 0.1–1.0)
Monocytes Relative: 11 % (ref 3–12)
Monocytes Relative: 9 % (ref 3–12)
Neutro Abs: 3.7 10*3/uL (ref 1.7–7.7)

## 2010-04-21 LAB — GLUCOSE, CAPILLARY
Glucose-Capillary: 132 mg/dL — ABNORMAL HIGH (ref 70–99)
Glucose-Capillary: 141 mg/dL — ABNORMAL HIGH (ref 70–99)
Glucose-Capillary: 214 mg/dL — ABNORMAL HIGH (ref 70–99)

## 2010-04-21 LAB — BASIC METABOLIC PANEL
BUN: 32 mg/dL — ABNORMAL HIGH (ref 6–23)
CO2: 27 mEq/L (ref 19–32)
Calcium: 9.1 mg/dL (ref 8.4–10.5)
Chloride: 104 mEq/L (ref 96–112)
Chloride: 104 mEq/L (ref 96–112)
GFR calc Af Amer: 37 mL/min — ABNORMAL LOW (ref >60)
Glucose, Bld: 162 mg/dL — ABNORMAL HIGH (ref 70–99)
Potassium: 3.6 mEq/L (ref 3.5–5.1)
Sodium: 138 mEq/L (ref 135–145)

## 2010-04-21 LAB — CBC
HCT: 33.7 % — ABNORMAL LOW (ref 39.0–52.0)
HCT: 34.8 % — ABNORMAL LOW (ref 39.0–52.0)
Hemoglobin: 11.4 g/dL — ABNORMAL LOW (ref 13.0–17.0)
Hemoglobin: 11.8 g/dL — ABNORMAL LOW (ref 13.0–17.0)
MCHC: 33.7 g/dL (ref 30.0–36.0)
MCHC: 34.2 g/dL (ref 30.0–36.0)
MCV: 91.2 fL (ref 78.0–100.0)
Platelets: 193 10*3/uL (ref 150–400)
RBC: 3.78 MIL/uL — ABNORMAL LOW (ref 4.22–5.81)
RDW: 14 % (ref 11.5–15.5)
RDW: 14.2 % (ref 11.5–15.5)
RDW: 14.3 % (ref 11.5–15.5)

## 2010-04-21 LAB — CARDIAC PANEL(CRET KIN+CKTOT+MB+TROPI)
CK, MB: 2.5 ng/mL (ref 0.3–4.0)
CK, MB: 2.8 ng/mL (ref 0.3–4.0)
CK, MB: 3 ng/mL (ref 0.3–4.0)
Relative Index: 1.9 (ref 0.0–2.5)
Relative Index: 2.2 (ref 0.0–2.5)
Total CK: 130 U/L (ref 7–232)
Total CK: 140 U/L (ref 7–232)
Troponin I: 0.04 ng/mL (ref 0.00–0.06)

## 2010-04-21 LAB — URINALYSIS, ROUTINE W REFLEX MICROSCOPIC
Glucose, UA: NEGATIVE mg/dL
Hgb urine dipstick: NEGATIVE
Ketones, ur: NEGATIVE mg/dL
Protein, ur: NEGATIVE mg/dL
Urobilinogen, UA: 0.2 mg/dL (ref 0.0–1.0)

## 2010-04-21 LAB — COMPREHENSIVE METABOLIC PANEL
Albumin: 3.4 g/dL — ABNORMAL LOW (ref 3.5–5.2)
Alkaline Phosphatase: 88 U/L (ref 39–117)
BUN: 30 mg/dL — ABNORMAL HIGH (ref 6–23)
Calcium: 8.9 mg/dL (ref 8.4–10.5)
Potassium: 3.5 mEq/L (ref 3.5–5.1)
Sodium: 139 mEq/L (ref 135–145)
Total Protein: 5.8 g/dL — ABNORMAL LOW (ref 6.0–8.3)

## 2010-04-21 LAB — RENAL FUNCTION PANEL
Albumin: 3.4 g/dL — ABNORMAL LOW (ref 3.5–5.2)
Chloride: 106 mEq/L (ref 96–112)
GFR calc Af Amer: 32 mL/min — ABNORMAL LOW (ref >60)
GFR calc non Af Amer: 26 mL/min — ABNORMAL LOW (ref >60)
Phosphorus: 4 mg/dL (ref 2.3–4.6)
Potassium: 3.6 mEq/L (ref 3.5–5.1)
Sodium: 139 mEq/L (ref 135–145)

## 2010-04-21 LAB — BLOOD GAS, ARTERIAL
Bicarbonate: 25.1 mEq/L — ABNORMAL HIGH (ref 20.0–24.0)
O2 Saturation: 95.7 %
Patient temperature: 37
TCO2: 22.6 mmol/L (ref 0–100)

## 2010-04-21 LAB — APTT: aPTT: 27 seconds (ref 24–37)

## 2010-04-21 LAB — POCT CARDIAC MARKERS
CKMB, poc: <1 ng/mL — ABNORMAL LOW (ref 1.0–8.0)
Troponin i, poc: <0.05 ng/mL (ref 0.00–0.09)

## 2010-04-22 LAB — BASIC METABOLIC PANEL
BUN: 40 mg/dL — ABNORMAL HIGH (ref 6–23)
Calcium: 8.8 mg/dL (ref 8.4–10.5)
GFR calc Af Amer: 32 mL/min — ABNORMAL LOW (ref >60)
GFR calc non Af Amer: 26 mL/min — ABNORMAL LOW (ref >60)
GFR calc non Af Amer: 30 mL/min — ABNORMAL LOW (ref >60)
Glucose, Bld: 173 mg/dL — ABNORMAL HIGH (ref 70–99)
Glucose, Bld: 230 mg/dL — ABNORMAL HIGH (ref 70–99)
Potassium: 3.5 mEq/L (ref 3.5–5.1)
Potassium: 3.7 mEq/L (ref 3.5–5.1)
Sodium: 137 mEq/L (ref 135–145)

## 2010-04-22 LAB — GLUCOSE, CAPILLARY
Glucose-Capillary: 205 mg/dL — ABNORMAL HIGH (ref 70–99)
Glucose-Capillary: 253 mg/dL — ABNORMAL HIGH (ref 70–99)
Glucose-Capillary: 343 mg/dL — ABNORMAL HIGH (ref 70–99)

## 2010-04-22 LAB — HEPATIC FUNCTION PANEL
ALT: 27 U/L (ref 0–53)
AST: 23 U/L (ref 0–37)
Bilirubin, Direct: 0.2 mg/dL (ref 0.0–0.3)
Total Bilirubin: 0.9 mg/dL (ref 0.3–1.2)

## 2010-04-22 LAB — BLOOD GAS, ARTERIAL
Bicarbonate: 23.2 mEq/L (ref 20.0–24.0)
FIO2: 21 %
Patient temperature: 37
pCO2 arterial: 44.1 mmHg (ref 35.0–45.0)
pH, Arterial: 7.34 — ABNORMAL LOW (ref 7.350–7.450)

## 2010-04-22 LAB — DIFFERENTIAL
Basophils Absolute: 0 10*3/uL (ref 0.0–0.1)
Basophils Absolute: 0 10*3/uL (ref 0.0–0.1)
Eosinophils Absolute: 0 10*3/uL (ref 0.0–0.7)
Eosinophils Relative: 0 % (ref 0–5)
Lymphocytes Relative: 20 % (ref 12–46)
Lymphocytes Relative: 8 % — ABNORMAL LOW (ref 12–46)
Lymphs Abs: 1.6 10*3/uL (ref 0.7–4.0)
Monocytes Absolute: 0.7 10*3/uL (ref 0.1–1.0)
Neutro Abs: 5.4 10*3/uL (ref 1.7–7.7)

## 2010-04-22 LAB — CBC
HCT: 35 % — ABNORMAL LOW (ref 39.0–52.0)
HCT: 35.1 % — ABNORMAL LOW (ref 39.0–52.0)
Hemoglobin: 11.9 g/dL — ABNORMAL LOW (ref 13.0–17.0)
Platelets: 206 10*3/uL (ref 150–400)
RDW: 14.1 % (ref 11.5–15.5)
RDW: 14.1 % (ref 11.5–15.5)
WBC: 7.9 10*3/uL (ref 4.0–10.5)

## 2010-04-22 LAB — CARDIAC PANEL(CRET KIN+CKTOT+MB+TROPI)
CK, MB: 2.9 ng/mL (ref 0.3–4.0)
Total CK: 180 U/L (ref 7–232)
Total CK: 223 U/L (ref 7–232)
Troponin I: 0.07 ng/mL — ABNORMAL HIGH (ref 0.00–0.06)
Troponin I: 0.08 ng/mL — ABNORMAL HIGH (ref 0.00–0.06)

## 2010-04-22 LAB — BRAIN NATRIURETIC PEPTIDE: Pro B Natriuretic peptide (BNP): 942 pg/mL — ABNORMAL HIGH (ref 0.0–100.0)

## 2010-04-22 LAB — POCT CARDIAC MARKERS

## 2010-04-22 LAB — LIPID PANEL
Cholesterol: 162 mg/dL (ref 0–200)
HDL: 58 mg/dL (ref >39)

## 2010-04-23 LAB — BLOOD GAS, ARTERIAL
pCO2 arterial: 34.8 mmHg — ABNORMAL LOW (ref 35.0–45.0)
pH, Arterial: 7.436 (ref 7.350–7.450)

## 2010-04-23 LAB — BASIC METABOLIC PANEL
BUN: 34 mg/dL — ABNORMAL HIGH (ref 6–23)
CO2: 25 mEq/L (ref 19–32)
Chloride: 100 mEq/L (ref 96–112)
Chloride: 102 mEq/L (ref 96–112)
GFR calc Af Amer: 34 mL/min — ABNORMAL LOW (ref >60)
GFR calc Af Amer: 38 mL/min — ABNORMAL LOW (ref >60)
GFR calc non Af Amer: 28 mL/min — ABNORMAL LOW (ref >60)
Potassium: 3.3 mEq/L — ABNORMAL LOW (ref 3.5–5.1)
Potassium: 3.4 mEq/L — ABNORMAL LOW (ref 3.5–5.1)
Potassium: 3.4 mEq/L — ABNORMAL LOW (ref 3.5–5.1)
Sodium: 133 mEq/L — ABNORMAL LOW (ref 135–145)
Sodium: 136 mEq/L (ref 135–145)

## 2010-04-23 LAB — DIFFERENTIAL
Basophils Relative: 0 % (ref 0–1)
Eosinophils Absolute: 0 10*3/uL (ref 0.0–0.7)
Eosinophils Absolute: 0 10*3/uL (ref 0.0–0.7)
Eosinophils Relative: 0 % (ref 0–5)
Eosinophils Relative: 0 % (ref 0–5)
Eosinophils Relative: 0 % (ref 0–5)
Lymphocytes Relative: 7 % — ABNORMAL LOW (ref 12–46)
Lymphs Abs: 0.5 10*3/uL — ABNORMAL LOW (ref 0.7–4.0)
Lymphs Abs: 1.1 10*3/uL (ref 0.7–4.0)
Lymphs Abs: 0.7 10*3/uL (ref 0.7–4.0)
Monocytes Absolute: 0.4 10*3/uL (ref 0.1–1.0)
Monocytes Relative: 4 % (ref 3–12)
Monocytes Relative: 4 % (ref 3–12)

## 2010-04-23 LAB — CBC
HCT: 36.9 % — ABNORMAL LOW (ref 39.0–52.0)
HCT: 37.6 % — ABNORMAL LOW (ref 39.0–52.0)
HCT: 37.3 % — ABNORMAL LOW (ref 39.0–52.0)
Hemoglobin: 13 g/dL (ref 13.0–17.0)
MCHC: 34.2 g/dL (ref 30.0–36.0)
MCV: 92.9 fL (ref 78.0–100.0)
MCV: 92.6 fL (ref 78.0–100.0)
Platelets: 192 10*3/uL (ref 150–400)
Platelets: 211 10*3/uL (ref 150–400)
Platelets: 192 10*3/uL (ref 150–400)
RBC: 4.02 MIL/uL — ABNORMAL LOW (ref 4.22–5.81)
RBC: 4.04 MIL/uL — ABNORMAL LOW (ref 4.22–5.81)
RBC: 4.02 MIL/uL — ABNORMAL LOW (ref 4.22–5.81)
WBC: 13.3 10*3/uL — ABNORMAL HIGH (ref 4.0–10.5)
WBC: 16.4 10*3/uL — ABNORMAL HIGH (ref 4.0–10.5)
WBC: 11.5 10*3/uL — ABNORMAL HIGH (ref 4.0–10.5)

## 2010-04-23 LAB — HEPATIC FUNCTION PANEL
Alkaline Phosphatase: 96 U/L (ref 39–117)
Indirect Bilirubin: 0.6 mg/dL (ref 0.3–0.9)
Total Bilirubin: 0.7 mg/dL (ref 0.3–1.2)

## 2010-04-23 LAB — CARDIAC PANEL(CRET KIN+CKTOT+MB+TROPI)
CK, MB: 3.4 ng/mL (ref 0.3–4.0)
CK, MB: 4.3 ng/mL — ABNORMAL HIGH (ref 0.3–4.0)
Relative Index: 2.2 (ref 0.0–2.5)
Relative Index: 2.4 (ref 0.0–2.5)
Troponin I: 0.25 ng/mL — ABNORMAL HIGH (ref 0.00–0.06)

## 2010-04-23 LAB — GLUCOSE, CAPILLARY
Glucose-Capillary: 192 mg/dL — ABNORMAL HIGH (ref 70–99)
Glucose-Capillary: 226 mg/dL — ABNORMAL HIGH (ref 70–99)
Glucose-Capillary: 268 mg/dL — ABNORMAL HIGH (ref 70–99)
Glucose-Capillary: 241 mg/dL — ABNORMAL HIGH (ref 70–99)
Glucose-Capillary: 300 mg/dL — ABNORMAL HIGH (ref 70–99)
Glucose-Capillary: 321 mg/dL — ABNORMAL HIGH (ref 70–99)

## 2010-04-23 LAB — POCT CARDIAC MARKERS: Troponin i, poc: <0.05 ng/mL (ref 0.00–0.09)

## 2010-04-23 LAB — BRAIN NATRIURETIC PEPTIDE: Pro B Natriuretic peptide (BNP): 1580 pg/mL — ABNORMAL HIGH (ref 0.0–100.0)

## 2010-04-28 ENCOUNTER — Encounter: Payer: Self-pay | Admitting: Cardiology

## 2010-05-04 ENCOUNTER — Telehealth: Payer: Self-pay | Admitting: Cardiology

## 2010-05-04 MED ORDER — CLONIDINE HCL 0.2 MG/24HR TD PTWK: 1.0000 | MEDICATED_PATCH | TRANSDERMAL | Status: DC

## 2010-05-04 NOTE — Telephone Encounter (Signed)
PT RX HAS EXPIRED FOR CLONIDINE PATCHES, PLEASE CALL IN TO [name]. PT WAS SEEN LAST 03/2010/

## 2010-05-12 ENCOUNTER — Encounter: Payer: Self-pay | Admitting: Family Medicine

## 2010-05-17 ENCOUNTER — Encounter: Payer: Self-pay | Admitting: Family Medicine

## 2010-05-18 ENCOUNTER — Telehealth: Payer: Self-pay

## 2010-05-18 ENCOUNTER — Ambulatory Visit (INDEPENDENT_AMBULATORY_CARE_PROVIDER_SITE_OTHER): Payer: MEDICARE | Admitting: Family Medicine

## 2010-05-18 ENCOUNTER — Encounter: Payer: Self-pay | Admitting: Family Medicine

## 2010-05-18 VITALS — BP 138/50 | HR 60 | Resp 16 | Ht 71.0 in | Wt 252.1 lb

## 2010-05-18 DIAGNOSIS — I509 Heart failure, unspecified: Secondary | ICD-10-CM

## 2010-05-18 DIAGNOSIS — F17200 Nicotine dependence, unspecified, uncomplicated: Secondary | ICD-10-CM

## 2010-05-18 DIAGNOSIS — Z79899 Other long term (current) drug therapy: Secondary | ICD-10-CM

## 2010-05-18 DIAGNOSIS — I10 Essential (primary) hypertension: Secondary | ICD-10-CM

## 2010-05-18 DIAGNOSIS — E1139 Type 2 diabetes mellitus with other diabetic ophthalmic complication: Secondary | ICD-10-CM

## 2010-05-18 DIAGNOSIS — I5031 Acute diastolic (congestive) heart failure: Secondary | ICD-10-CM

## 2010-05-18 DIAGNOSIS — K5900 Constipation, unspecified: Secondary | ICD-10-CM | POA: Insufficient documentation

## 2010-05-18 DIAGNOSIS — E785 Hyperlipidemia, unspecified: Secondary | ICD-10-CM

## 2010-05-18 LAB — GLUCOSE, POCT (MANUAL RESULT ENTRY): POC Glucose: 57

## 2010-05-18 NOTE — Patient Instructions (Addendum)
F/u in 4 months  pls eat regularly.  I am happy that you intend to quit smoking, pls do.  I am sending in miralax daily for your bowel movements  Fasting labs early July. You are being referred to local endocrinologist per your request

## 2010-05-19 NOTE — Progress Notes (Signed)
  Subjective:    Patient ID: [name], male    DOB: 1945-05-10, 65 y.o.   MRN: [id number]  HPI HYPERTENSION Disease Monitoring Blood pressure range-unknown Chest pain- no      Dyspnea- no Medications Compliance- good Lightheadedness- no   Edema- no   DIABETES Disease Monitoring Blood Sugar ranges-100 to 120 fasting Polyuria- no New Visual problems- no Medications Compliance- good Hypoglycemic symptoms- occasionally   HYPERLIPIDEMIA Disease Monitoring See symptoms for Hypertension Medications Compliance- on no medRUQ pain- no  Muscle aches- no    Review of Systems Denies recent fever or chills. Denies sinus pressure, nasal congestion, ear pain or sore throat. Denies chest congestion, productive cough or wheezing. Denies chest pains, palpitations, paroxysmal nocturnal dyspnea, orthopnea and leg swelling Denies abdominal pain, nausea, vomiting or diarrhea .  Denies rectal bleeding or change in bowel movement.c/o constipation, BM's every 3 to 4 days and hard. Denies dysuria, frequency, hesitancy or incontinence. Denies joint pain, swelling and limitation in mobility. Denies headaches, seizure, numbness, or tingling. Chronic mild depression,denies  anxiety or insomnia. Denies skin break down or rash.       Objective:   Physical Exam Pleasant male, alert and oriented and in no Cardiopulmonary distress.  HEENT: No facial asymmetry, EOMI, no sinus tenderness, TM's clear, Oropharynx pink and moist.  Neck supple no adenopathy.  Chest: Clear to auscultation bilaterally.Decreased air entry throughout  CVS: S1, S2 positive  murmur, no S3.Marland Kitchen No JVD  ABD: Soft, obese non tender. Bowel sounds normal.  Ext: No edema  MS: Adequate ROM spine, shoulders, hips and knees.  Skin: Intact, no ulcerations or rash noted.  Psych: Good eye contact, normal affect. Memory intact not anxious or depressed appearing.  CNS: CN 2-12 intact, power, tone and sensation normal throughout.      Assessment & Plan:

## 2010-05-22 DIAGNOSIS — E785 Hyperlipidemia, unspecified: Secondary | ICD-10-CM | POA: Insufficient documentation

## 2010-05-22 NOTE — Assessment & Plan Note (Signed)
Unchanged , wants to reduce use , finds it a challenge

## 2010-05-22 NOTE — Assessment & Plan Note (Signed)
counseled re impt of high fiber diet and daily stool softener. Mirilax prescribed also

## 2010-05-22 NOTE — Assessment & Plan Note (Signed)
Uncontrolled , and pt on no statin, needs recent lipid profile, and based on his diabetes only he is a candidate for statin ntherapy unless contraindicated

## 2010-05-22 NOTE — Assessment & Plan Note (Signed)
Stable at this time, followed by cardiology

## 2010-05-22 NOTE — Assessment & Plan Note (Signed)
Controlled, no change in medication  

## 2010-05-22 NOTE — Assessment & Plan Note (Signed)
Controlled, will refer to local endo per pt request, he does have episodes of hypoglycemia at times, and is asymptomatic often. He also has multiple complications of uncontrolled long standing DM

## 2010-05-22 NOTE — Telephone Encounter (Signed)
pls see referral for endo per pt request and send recent labs alos, thanks

## 2010-05-24 ENCOUNTER — Encounter: Payer: Self-pay | Admitting: Family Medicine

## 2010-06-04 NOTE — Op Note (Signed)
[facility]  Patient:    BAWI, LAKINS Visit Number: [id number] MRN: [id number]          Service Type: DSU Location: DAY Attending Physician:  Jonathon Bellows Dictated by:   Roetta Sessions, M.D. Proc. Date: 01/01/01 Admit Date:  01/01/2001 Discharge Date: 01/01/2001   CC:         Avon Gully, M.D.   Operative Report  PROCEDURE:  Screening colonoscopy.  ENDOSCOPIST:  Roetta Sessions, M.D.  INDICATION FOR PROCEDURE:  Patient is a 65 year old gentleman referred at the courtesy of Dr. Avon Gully for colorectal cancer screening.  Mr. Calderwood is devoid of any GI symptoms, no family history of colorectal neoplasia and has never had his lower GI tract evaluated.  Colonoscopy is now being done to screen his colon.  This approach has been discussed with Mr. Thueson.  The potential risks, benefits and alternatives have been reviewed, questions answered and he is agreeable.  Please see my handwritten H&P for more information.  I feel he is low risk for conscious sedation.  DESCRIPTION OF PROCEDURE:  Patient was placed in the left lateral decubitus position.  O2 saturation, blood pressure, pulse and respirations were monitored throughout the entire procedure.  Conscious sedation:  Versed 3 mg IV, Demerol 75 mg IV in divided doses.  INSTRUMENT:  Olympus video chip colonoscope.  FINDINGS:  Digital rectal examination revealed no abnormalities.  ENDOSCOPIC FINDINGS:  The prep was good.  Rectum:  Examination of the rectal mucosa including a retroflexed view of the anal verge revealed no abnormalities.  Colon:  Colonic mucosa was surveyed from the rectosigmoid junction through the left, transverse and right colon to the area of the appendiceal orifice, ileocecal valve and cecum.  These structures were well-seen and photographed. Aside from having a long, tortuous redundant colon which required changing of the patients position and external abdominal pressure  to reach the cecum, the colonic mucosa appeared entirely normal.  Please see the photos of the cecum, ileocecal valve and appendiceal orifice.  From this level, the scope was slowly withdrawn and all previously mucosal surfaces were again seen.  Again, no abnormalities were observed.  The patient tolerated the procedure well and was reactive at endoscopy.  IMPRESSION: 1. Normal rectum. 2. Elongated, tortuous but otherwise normal-appearing colonic mucosa.  RECOMMENDATIONS: 1. Repeat colonoscopy in 10 years. 2. Follow up with Dr. Felecia Shelling. Dictated by:   Roetta Sessions, M.D. Attending Physician:  Jonathon Bellows DD:  01/01/01 TD:  01/01/01 Job: [id number]

## 2010-06-04 NOTE — Op Note (Signed)
[name], [name]                 ACCOUNT NO.:  [id number]   MEDICAL RECORD NO.:  [id number]          PATIENT TYPE:  AMB   LOCATION:  DAY                           FACILITY:  [facility]   PHYSICIAN:  R. Roetta Sessions, M.D. DATE OF BIRTH:  Jun 03, 1945   DATE OF PROCEDURE:  08/23/2004  DATE OF DISCHARGE:                                 OPERATIVE REPORT   PROCEDURE:  Colonoscopy with biopsy.   INDICATIONS FOR PROCEDURE:  The patient is a 65 year old African-American  male sent over at the courtesy of Dr. Felecia Shelling for colorectal cancer screening.  Dr. Felecia Shelling wants him to have a colonoscopy. Mr. Ginsberg is not having any GI  symptoms whatsoever. No bleeding, etc., per his report. He had a colonoscopy  a few years ago for screening. He had an elongated colon and nothing else.  He is here for screening colonoscopy. This approach has been discussed with  the patient at length. Potential risks, benefits, and alternatives have been  reviewed and questions answered. He is agreeable. Please see documentation  in the medical record.   PROCEDURE NOTE:  O2 saturation, blood pressure, pulse, and respirations were  monitored throughout the entire procedure. Conscious sedation with Versed 3  mg IV and Demerol 75 mg IV in divided doses.   INSTRUMENT:  Olympus video chip system.   FINDINGS:  Digital rectal exam revealed no abnormalities.   ENDOSCOPIC FINDINGS:  Prep was fair.   Rectum:  Examination of the rectal mucosa including retroflexed view of the  anal verge revealed no abnormalities.   Colon:  Colonic mucosa was surveyed from the rectosigmoid junction through  the left, transverse, and right colon to the area of the appendiceal  orifice, ileocecal valve, and cecum. These structures were well seen and  photographed for the record. From this level, the scope was slowly  withdrawn, and all previously mentioned mucosal surfaces were again seen.  The colonic mucosa appeared normal except for a 4-mm polyp at  25 cm  (rectosigmoid). It was removed with cold biopsy forceps technique. The  remainder of the colon and rectum appeared normal. The patient tolerated the  procedure well and was reactive to endoscopy.   IMPRESSION:  1. Normal rectum.  2. Diminutive polyp of the rectosigmoid, cold biopsied/removed. Remainder      of the colonic mucosa appeared normal.   RECOMMENDATIONS:  1. Follow up on pathology.  2. Further recommendations to follow.       RMR/MEDQ  D:  08/23/2004  T:  08/23/2004  Job:  [id number]   cc:   Tesfaye D. Felecia Shelling, MD  [address]  [place]  [place] [address]  Fax: [phone]

## 2010-06-07 ENCOUNTER — Telehealth: Payer: Self-pay | Admitting: Family Medicine

## 2010-06-09 ENCOUNTER — Telehealth: Payer: Self-pay | Admitting: Family Medicine

## 2010-06-09 NOTE — Telephone Encounter (Signed)
States for the last three days he has been coughing and hoarse, states pharmacist mentioned whooping cough, what do you suggest

## 2010-06-09 NOTE — Telephone Encounter (Signed)
Called patient, no answer 

## 2010-06-09 NOTE — Telephone Encounter (Signed)
Duplicate message. 

## 2010-06-10 ENCOUNTER — Other Ambulatory Visit: Payer: Self-pay | Admitting: *Deleted

## 2010-06-10 ENCOUNTER — Telehealth: Payer: Self-pay | Admitting: *Deleted

## 2010-06-10 MED ORDER — BENZONATATE 100 MG PO CAPS: 100.0000 mg | ORAL_CAPSULE | Freq: Three times a day (TID) | ORAL | Status: DC | PRN

## 2010-06-10 MED ORDER — CLARITHROMYCIN 500 MG PO TABS: 500.0000 mg | ORAL_TABLET | Freq: Three times a day (TID) | ORAL | Status: AC

## 2010-06-10 MED ORDER — CLONIDINE HCL 0.2 MG/24HR TD PTWK: 1.0000 | MEDICATED_PATCH | TRANSDERMAL | Status: DC

## 2010-06-10 NOTE — Telephone Encounter (Signed)
I am just seeing the note which steates yellow sputum, pls also send in along with the tessalon perles, biaxin (clarithromycin)500mg  one 3 times daily #21 only

## 2010-06-10 NOTE — Telephone Encounter (Signed)
Patient has congested cough with yellow sputum x 1 week, no other symptoms, uses [employer]

## 2010-06-10 NOTE — Telephone Encounter (Signed)
Patient aware.

## 2010-06-10 NOTE — Telephone Encounter (Signed)
meds sent in per dr Lodema Hong

## 2010-06-10 NOTE — Telephone Encounter (Signed)
meds sent in per dr simpson. Called and left message

## 2010-06-10 NOTE — Telephone Encounter (Signed)
If pty has no fever , chills, green sputum, or respiratory distress,no antibiotics. If he has increased head congestion and drainage, use otc zyrtec or claritin one daily.  Pls send in tessalon perles 100mg  one three times daily for 10 days only for the cough and let him know

## 2010-06-16 ENCOUNTER — Ambulatory Visit (INDEPENDENT_AMBULATORY_CARE_PROVIDER_SITE_OTHER): Payer: Medicare Other

## 2010-06-16 VITALS — BP 135/61 | HR 74 | Ht 70.0 in | Wt 254.0 lb

## 2010-06-16 DIAGNOSIS — I10 Essential (primary) hypertension: Secondary | ICD-10-CM

## 2010-06-16 NOTE — Progress Notes (Signed)
**Note De-Identified  Obfuscation** S: Pt. arrives in office for a 2 month BP check/nurse visit B: On last OV with Dr. Dietrich Pates on 04-16-10 pt. was advised to increase Labetalol to 900mg  bid, monitor BP daily and keep BP diary A: Pt. c/o occasional CP (EKG obtained and placed in Dr. Marvel Plan EKG's folder)and swelling in hands and feet. BP today is 135/61 and on last OV BP was 161/57. Pt. did bring his medication bottles and states he is taking as directed. Also, he brought his BP diary (placed in Dr. Marvel Plan report's folder). R: Pt. advised that we will contact him with Dr. Marvel Plan recommendations, if any.  06/18/10 BP controlled F/U as planned.      [place] Bing, MD

## 2010-06-21 ENCOUNTER — Encounter: Payer: Self-pay | Admitting: Family Medicine

## 2010-06-21 ENCOUNTER — Ambulatory Visit (HOSPITAL_COMMUNITY)
Admission: RE | Admit: 2010-06-21 | Discharge: 2010-06-21 | Disposition: A | Discharge status: Home / Self Care | Payer: 59 | Source: Ambulatory Visit | Attending: Family Medicine | Admitting: Family Medicine

## 2010-06-21 ENCOUNTER — Ambulatory Visit (INDEPENDENT_AMBULATORY_CARE_PROVIDER_SITE_OTHER): Payer: Medicare Other | Admitting: Family Medicine

## 2010-06-21 ENCOUNTER — Telehealth: Payer: Self-pay | Admitting: Family Medicine

## 2010-06-21 VITALS — BP 160/46 | HR 78 | Resp 16 | Ht 71.0 in | Wt 257.1 lb

## 2010-06-21 DIAGNOSIS — I10 Essential (primary) hypertension: Secondary | ICD-10-CM

## 2010-06-21 DIAGNOSIS — R059 Cough, unspecified: Secondary | ICD-10-CM | POA: Insufficient documentation

## 2010-06-21 DIAGNOSIS — E785 Hyperlipidemia, unspecified: Secondary | ICD-10-CM

## 2010-06-21 DIAGNOSIS — E119 Type 2 diabetes mellitus without complications: Secondary | ICD-10-CM | POA: Insufficient documentation

## 2010-06-21 DIAGNOSIS — F17200 Nicotine dependence, unspecified, uncomplicated: Secondary | ICD-10-CM

## 2010-06-21 DIAGNOSIS — R05 Cough: Secondary | ICD-10-CM | POA: Insufficient documentation

## 2010-06-21 DIAGNOSIS — R609 Edema, unspecified: Secondary | ICD-10-CM

## 2010-06-21 DIAGNOSIS — N184 Chronic kidney disease, stage 4 (severe): Secondary | ICD-10-CM

## 2010-06-21 DIAGNOSIS — J40 Bronchitis, not specified as acute or chronic: Secondary | ICD-10-CM

## 2010-06-21 DIAGNOSIS — R600 Localized edema: Secondary | ICD-10-CM

## 2010-06-21 MED ORDER — FUROSEMIDE 10 MG/ML IJ SOLN
10.0000 mg | Freq: Once | INTRAMUSCULAR | Status: AC
Start: 2010-06-21 — End: 2010-06-21
  Administered 2010-06-21: 10 mg via INTRAMUSCULAR

## 2010-06-21 MED ORDER — BUDESONIDE-FORMOTEROL FUMARATE 160-4.5 MCG/ACT IN AERO
2.0000 | INHALATION_SPRAY | Freq: Two times a day (BID) | RESPIRATORY_TRACT | Status: DC
Start: 2010-06-21 — End: 2011-05-12

## 2010-06-21 NOTE — Telephone Encounter (Signed)
pls see if pt can come in today to be seen

## 2010-06-21 NOTE — Patient Instructions (Addendum)
F/u in 3 months. cXR  Today.  Lasix injection for swelling today.  It is important to restrict your fluid to a max of 1.8 to 2 liters per [place]

## 2010-06-22 MED ORDER — VALSARTAN 320 MG PO TABS: 320.0000 mg | ORAL_TABLET | Freq: Every day | ORAL | Status: DC

## 2010-06-22 NOTE — Progress Notes (Signed)
**Note De-Identified  Obfuscation** Pt. advised, he states he understands.

## 2010-06-29 ENCOUNTER — Encounter: Payer: Self-pay | Admitting: Cardiology

## 2010-07-02 ENCOUNTER — Encounter: Payer: Self-pay | Admitting: Cardiology

## 2010-07-04 NOTE — Assessment & Plan Note (Signed)
Uncontrolled, elevated LDL, low fat counseling done,

## 2010-07-04 NOTE — Progress Notes (Signed)
  Subjective:    Patient ID: [name], male    DOB: [birth year], 65 y.o.   MRN: [id number]  HPI C/o cough x several weeks, completed antibiotic course, reports reduced sputum since then, however has weight gain, leg swelling and exertional dyspnea, he is also still smoking.denies any recent fever or chills Blood sugars are generally within range when checked   Review of Systems See HPI Denies recent fever or chills. Denies sinus pressure, nasal congestion, ear pain or sore throat. Denies abdominal pain, nausea, vomiting,diarrhea or constipation.  Denies rectal bleeding or change in bowel movement. Denies dysuria, frequency, hesitancy or incontinence. Denies joint pain, swelling and limitation in mobility. Denies headaches, seizure, numbness, or tingling. Mild depression and  anxiety not suicidal or homicidal Denies skin break down or rash.        Objective:   Physical Exam Patient alert and oriented and in no Cardiopulmonary distress.  HEENT: No facial asymmetry, EOMI, no sinus tenderness, TM's clear, Oropharynx pink and moist.  Neck supple no adenopathy.  Chest: Decreased air entry, few bibasilar crackles CVS: S1, S2 systolicmurmur, no S3.  ABD: Soft non tender. Bowel sounds normal.  Ext: one to two plus edema  MS: Adequate ROM spine, shoulders, hips and knees.  Skin: Intact, no ulcerations or rash noted.  Psych: Good eye contact, normal affect. Memory intact mildly anxious not depressed appearing.  CNS: CN 2-12 intact       Assessment & Plan:

## 2010-07-04 NOTE — Assessment & Plan Note (Signed)
Deteriorated , importance of fluid restriction in heart failure stressed

## 2010-07-04 NOTE — Assessment & Plan Note (Signed)
Controlled, no change in medication  

## 2010-07-04 NOTE — Assessment & Plan Note (Signed)
Unchanged , cessation counseling done 

## 2010-07-04 NOTE — Assessment & Plan Note (Signed)
Needs to be following with nephrologist , will discuss at next ov, cardiology believes this is a bigger contributor to leg edema than his heart disease

## 2010-08-16 ENCOUNTER — Ambulatory Visit (INDEPENDENT_AMBULATORY_CARE_PROVIDER_SITE_OTHER): Payer: Medicare Other | Admitting: Family Medicine

## 2010-08-16 ENCOUNTER — Encounter: Payer: Self-pay | Admitting: Family Medicine

## 2010-08-16 VITALS — BP 140/50 | HR 72 | Ht 71.0 in | Wt 249.0 lb

## 2010-08-16 DIAGNOSIS — R062 Wheezing: Secondary | ICD-10-CM

## 2010-08-16 DIAGNOSIS — R0989 Other specified symptoms and signs involving the circulatory and respiratory systems: Secondary | ICD-10-CM

## 2010-08-16 DIAGNOSIS — J209 Acute bronchitis, unspecified: Secondary | ICD-10-CM

## 2010-08-16 DIAGNOSIS — F17200 Nicotine dependence, unspecified, uncomplicated: Secondary | ICD-10-CM

## 2010-08-16 MED ORDER — DOXYCYCLINE HYCLATE 100 MG PO TABS: 100.0000 mg | ORAL_TABLET | Freq: Two times a day (BID) | ORAL | Status: AC

## 2010-08-16 MED ORDER — METHYLPREDNISOLONE ACETATE 40 MG/ML IJ SUSP: 40.0000 mg | Freq: Once | INTRAMUSCULAR | Status: DC

## 2010-08-16 MED ORDER — ALBUTEROL SULFATE (2.5 MG/3ML) 0.083% IN NEBU
2.5000 mg | INHALATION_SOLUTION | Freq: Once | RESPIRATORY_TRACT | Status: AC
  Administered 2010-08-16: 2.5 mg via RESPIRATORY_TRACT

## 2010-08-16 MED ORDER — IPRATROPIUM BROMIDE 0.02 % IN SOLN
0.5000 mg | Freq: Once | RESPIRATORY_TRACT | Status: AC
  Administered 2010-08-16: 0.5 mg via RESPIRATORY_TRACT

## 2010-08-16 MED ORDER — METHYLPREDNISOLONE ACETATE 40 MG/ML IJ SUSP
40.0000 mg | Freq: Once | INTRAMUSCULAR | Status: AC
  Administered 2010-08-16: 40 mg via INTRAMUSCULAR

## 2010-08-16 MED ORDER — PREDNISONE 10 MG PO TABS: ORAL_TABLET | ORAL | Status: DC

## 2010-08-16 NOTE — Progress Notes (Signed)
  Subjective:    Patient ID: [name], male    DOB: 1945-11-24, 65 y.o.   MRN: [id number]  HPI  Cough and wheezing x 3 days- cough with minimal white sputum, feels short of breath with cough and exertion, hears himself wheezing , positive sick contact with wife who has bronchitis, current smokes  questionable history of COPD in the past, told it was not COPD but CHF Did not try his symbicort Reviewed last X-ray in June which showed changes of chronic bronchitis and questionable emphysema  Review of Systems     Objective:   Physical Exam  GEN- fatgiued appearing, audible wheezing HEENT- oropharynx clear CVS-RRR, no murmur RESP- bilateral expiratory and inspiratory wheeze, no retractions, rhonchi at right base, prolonged expiration time, fair air movement, oxygen sat 95 % RA, speaking in full sentences EXT 1 + edema  S/p neb- Resp- scattered wheeze, good air movement, rhonchi unchanged      Assessment & Plan:

## 2010-08-16 NOTE — Patient Instructions (Addendum)
Take the antibiotic as prescribed for 7 days Doxycycline 100mg  by mouth twice a day Use your Symbicort twice a day Use the albuterol every 4 hours as needed for wheezing Return for a recheck on Thurs if you do not improve Take the steroid as prescribed 40mg  daily for 5 days If your Shortness or breath gets worse please go to the ER This would be a great time to quit smoking

## 2010-08-17 ENCOUNTER — Encounter: Payer: Self-pay | Admitting: Family Medicine

## 2010-08-17 DIAGNOSIS — J209 Acute bronchitis, unspecified: Secondary | ICD-10-CM | POA: Insufficient documentation

## 2010-08-17 NOTE — Assessment & Plan Note (Signed)
I believe this is an acute bronchitis flare, he is a chronic smoker with changes on previous x-ray. He was given a course of antibiotics approx 6 weeks ago per report, will treat with Doxy, prednisone and Albuterol prn. Pt given Depo-medrol 40mg  injection this afternoon. Oxygen sat remained stable and he improved with neb treatment. He presentation was not typical of decompensated CHF Would consider repeat Pulmonary work-up Recheck on Thurs if not improved, would obtain repeat X-ray then

## 2010-08-17 NOTE — Assessment & Plan Note (Signed)
Encouraged tobacco cessation, pt does not appear ready to quit at this time

## 2010-08-27 ENCOUNTER — Other Ambulatory Visit: Payer: Self-pay | Admitting: Nephrology

## 2010-08-27 ENCOUNTER — Ambulatory Visit
Admission: RE | Admit: 2010-08-27 | Discharge: 2010-08-27 | Disposition: A | Discharge status: Home / Self Care | Payer: Medicare Other | Source: Ambulatory Visit | Attending: Nephrology | Admitting: Nephrology

## 2010-08-27 DIAGNOSIS — R062 Wheezing: Secondary | ICD-10-CM

## 2010-09-13 ENCOUNTER — Ambulatory Visit (INDEPENDENT_AMBULATORY_CARE_PROVIDER_SITE_OTHER): Payer: Medicare Other | Admitting: Adult Health

## 2010-09-13 ENCOUNTER — Encounter: Payer: Self-pay | Admitting: Adult Health

## 2010-09-13 DIAGNOSIS — I5031 Acute diastolic (congestive) heart failure: Secondary | ICD-10-CM

## 2010-09-13 DIAGNOSIS — R0989 Other specified symptoms and signs involving the circulatory and respiratory systems: Secondary | ICD-10-CM

## 2010-09-13 DIAGNOSIS — R0609 Other forms of dyspnea: Secondary | ICD-10-CM

## 2010-09-13 DIAGNOSIS — I359 Nonrheumatic aortic valve disorder, unspecified: Secondary | ICD-10-CM

## 2010-09-13 DIAGNOSIS — R0600 Dyspnea, unspecified: Secondary | ICD-10-CM | POA: Insufficient documentation

## 2010-09-13 DIAGNOSIS — I509 Heart failure, unspecified: Secondary | ICD-10-CM

## 2010-09-13 DIAGNOSIS — I10 Essential (primary) hypertension: Secondary | ICD-10-CM

## 2010-09-13 DIAGNOSIS — R609 Edema, unspecified: Secondary | ICD-10-CM

## 2010-09-13 NOTE — Assessment & Plan Note (Addendum)
He is advised to quit smoking.  CXR will evaluate for progression of lung disease. He is advised to use Symbicort inhaler BID as directed instead of prn as he has been. He is a little bradycardic on this visit. Consider this as another cause of his shortness of breath and fatigue. He is on labetalol and clonidine which can contribute to brady.  Will continue to monitor. If echo okay may consider stress test to evaluate further with CVRF's.

## 2010-09-13 NOTE — Patient Instructions (Signed)
   Echo  Chest x-ray Your physician recommends that you have the following labs - BMET & BNP Follow up after above testing

## 2010-09-13 NOTE — Assessment & Plan Note (Signed)
He has evidence of fluid overload with abdominal distention,although not substantial, and mild non-pitting edema of the lower extremities.  I will check a chest x-ray and BNP for evaluation of this. He is to continue the demedex as directed 100mg  BID.  He states he continues to have good urine output when he takes this.  BMET will also be drawn for kidney fx.  I will repeat echo as well. He has a substantial systolic murmur and this will assess severity of AoV stenosis.

## 2010-09-13 NOTE — Progress Notes (Signed)
HPI: [name] is a 65 y/o obese patient of Dr. Dietrich Pates we are seeing for continued assessment and treatment of diastolic CHF, hypertension, AoV stenosis, with history of asthma, diabetes and ongoing tobacco abuse.  He comes today because he has been feeling more tired, more DOE and gaining wt. He states that walking to take the garbage can to the road causes him to have to stop 2-3 times to rest before getting to the road. He states he feels fluid overloaded despite use of demedex.  He unfortunately continues to smoke, but states he avoids salt. He denies chest pain but does have pressure when he get short of breath.   No sweating, nausea or diaphoresis.  He is becoming more concerned and requests assistance.  Allergies  Allergen Reactions  . Ace Inhibitors     REACTION: cough    Current Outpatient Prescriptions  Medication Sig Dispense Refill  . albuterol (PROVENTIL,VENTOLIN) 90 MCG/ACT inhaler Inhale 2 puffs into the lungs every 4 (four) hours as needed.        Marland Kitchen AMLODIPINE BESYLATE PO Take 10 mg by mouth daily. Take one tab daily        . aspirin (ASPIRIN LOW DOSE) 81 MG EC tablet Take 81 mg by mouth daily.        . budesonide-formoterol (SYMBICORT) 160-4.5 MCG/ACT inhaler Inhale 2 puffs into the lungs 2 (two) times daily.  1 Inhaler  12  . calcitRIOL (ROCALTROL) 0.25 MCG capsule Take 0.25 mcg by mouth daily.        . cloNIDine (CATAPRES-TTS-2) 0.2 mg/24hr patch Place 1 patch (0.2 mg total) onto the skin once a week. Apply one patch every 7 days    4 patch  3  . insulin aspart (NOVOLOG) 100 UNIT/ML injection Inject into the skin 3 (three) times daily before meals. Sliding scale       . insulin glargine (LANTUS SOLOSTAR) 100 UNIT/ML injection Inject into the skin at bedtime. Inject 20 units subcutaneously pm       . LABETALOL HCL PO Take 2 tablets by mouth three times daily      . ONE TOUCH ULTRA TEST test strip       . potassium chloride SA (K-DUR,KLOR-CON) 20 MEQ tablet 20 mEq 2 (two)  times daily.       Marland Kitchen TERAZOSIN HCL PO Two 10mg  caps by mouth daily      . torsemide (DEMADEX) 100 MG tablet Take 100 mg by mouth 2 (two) times daily.       . valsartan (DIOVAN) 320 MG tablet Take 1 tablet (320 mg total) by mouth daily.  30 tablet  3    Past Medical History  Diagnosis Date  . Congestive heart failure 10/2008    with preserved LV systolic function   . Aortic insufficiency 09/2008    Normal left ventricular size and function : graded as mild by Doppler , but pulse pressure is widened  . 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 Hypertension   . Chronic kidney disease (CKD), stage IV (severe) 12/2009    creatinine  2.2 in 12/2009  . Erectile dysfunction   . Colonic polyp   . Low serum testosterone level   . Tobacco abuse   . Sleep apnea 2010    evaluated by Dr. Gerilyn Pilgrim, but unable to afford CPAP  . Benign prostatic hypertrophy   . Obesity, Class I, BMI 30-34.9     Past Surgical History  Procedure Date  . Retinal laser procedure     diabetic retinopathy  . Lymph node biopsy     surgical exploration of neck-not entirely clear that this represented a lymph node biopsy    [id number] of systems complete and found to be negative unless listed above PHYSICAL EXAM BP 140/51  Pulse 67  Resp 18  Ht 5\' 10"  (1.778 m)  Wt 247 lb (112.038 kg)  BMI 35.44 kg/m2 General: Well developed, well nourished, in no acute distress Head: Eyes PERRLA, No xanthomas.   Normal cephalic and atramatic  Lungs: Clear bilaterally to auscultation and percussion. Heart: HRRR S1 S2,2/6 holosystolic murmur at the RSB and at the apex , there is a radiation carotid bruit. No JVD.  No abdominal bruits. No femoral bruits. Abdomen: Bowel sounds are positive, abdomen mildly distended and non-tender without masses or                  Hernia's noted. Msk:  Back normal, normal gait. Normal strength and tone for age. Extremities: No clubbing, cyanosis, mild non-pitting edema.   DP +1 Neuro: Alert and oriented X 3. Psych:  Good affect, responds appropriately  EKG:SB rate of 59 bpm. Mild LVH  ASSESSMENT AND PLAN

## 2010-09-13 NOTE — Assessment & Plan Note (Signed)
Echocardiogram will be completed as stated above for evaluation of same.

## 2010-09-16 ENCOUNTER — Ambulatory Visit (HOSPITAL_COMMUNITY)
Admission: RE | Admit: 2010-09-16 | Discharge: 2010-09-16 | Disposition: A | Discharge status: Home / Self Care | Payer: 59 | Source: Ambulatory Visit | Attending: Adult Health | Admitting: Adult Health

## 2010-09-16 DIAGNOSIS — E119 Type 2 diabetes mellitus without complications: Secondary | ICD-10-CM | POA: Insufficient documentation

## 2010-09-16 DIAGNOSIS — I359 Nonrheumatic aortic valve disorder, unspecified: Secondary | ICD-10-CM

## 2010-09-16 DIAGNOSIS — R0609 Other forms of dyspnea: Secondary | ICD-10-CM | POA: Insufficient documentation

## 2010-09-16 DIAGNOSIS — E785 Hyperlipidemia, unspecified: Secondary | ICD-10-CM | POA: Insufficient documentation

## 2010-09-16 DIAGNOSIS — R0989 Other specified symptoms and signs involving the circulatory and respiratory systems: Secondary | ICD-10-CM | POA: Insufficient documentation

## 2010-09-16 DIAGNOSIS — I10 Essential (primary) hypertension: Secondary | ICD-10-CM | POA: Insufficient documentation

## 2010-09-16 NOTE — Progress Notes (Signed)
*  PRELIMINARY RESULTS* Echocardiogram 2D Echocardiogram has been performed.  [facility] 09/16/2010, 1:37 PM

## 2010-09-23 ENCOUNTER — Encounter: Payer: Self-pay | Admitting: Cardiology

## 2010-09-24 ENCOUNTER — Encounter: Payer: Self-pay | Admitting: Family Medicine

## 2010-09-24 ENCOUNTER — Encounter: Payer: Self-pay | Admitting: Adult Health

## 2010-09-27 ENCOUNTER — Ambulatory Visit: Payer: Medicare Other | Admitting: Family Medicine

## 2010-09-27 ENCOUNTER — Encounter: Payer: Self-pay | Admitting: Adult Health

## 2010-09-28 ENCOUNTER — Encounter: Payer: Self-pay | Admitting: Adult Health

## 2010-09-28 ENCOUNTER — Ambulatory Visit (INDEPENDENT_AMBULATORY_CARE_PROVIDER_SITE_OTHER): Payer: Medicare Other | Admitting: Adult Health

## 2010-09-28 ENCOUNTER — Encounter: Payer: Self-pay | Admitting: Family Medicine

## 2010-09-28 DIAGNOSIS — I10 Essential (primary) hypertension: Secondary | ICD-10-CM

## 2010-09-28 DIAGNOSIS — I359 Nonrheumatic aortic valve disorder, unspecified: Secondary | ICD-10-CM

## 2010-09-28 DIAGNOSIS — I5031 Acute diastolic (congestive) heart failure: Secondary | ICD-10-CM

## 2010-09-28 DIAGNOSIS — R0602 Shortness of breath: Secondary | ICD-10-CM

## 2010-09-28 DIAGNOSIS — E1139 Type 2 diabetes mellitus with other diabetic ophthalmic complication: Secondary | ICD-10-CM

## 2010-09-28 DIAGNOSIS — I509 Heart failure, unspecified: Secondary | ICD-10-CM

## 2010-09-28 NOTE — Assessment & Plan Note (Addendum)
He is continuing to gain wt and eat salty foods. His wt is up 5 lbs. His abdominal girth is expanding, but no JVD is noted at this time. Review of labs, unfortunately do not include BNP. Renal fx is essentially the same as recent labs in May 2012 per Dr. Lodema Hong with Creatinine of 2.59 (compared to 2.67 in May).  CXR is normal.  I have recommended NO SALT diet and given him written instructions on same. He will have a Hgb A1C and BNP completed. He is advised to increase his activities.

## 2010-09-28 NOTE — Progress Notes (Signed)
HPI: Mr. Berrios is a 65 y/o patient of Dr. Dietrich Pates we are seeing for continued assessment and treatment of diastolic heart failure, hypertension, AoV stenosis, with history of asthma, diabetes, and ongoing tobacco abuse.  On last visit he complained about wt gain, fluid retention and mild DOE.  I checked a CXR, labs to include BNP (unfortunately not done), and echo to evaluate him further. He is here to discuss the results.  He is continuing to gain wt, and admits to dietary indiscretion with salt, eating chicken and dumplings, fried foods, and not staying on diabetic diet. He has gain 5 lbs since being seen last 3 weeks ago.  Allergies  Allergen Reactions  . Ace Inhibitors     REACTION: cough    Current Outpatient Prescriptions  Medication Sig Dispense Refill  . albuterol (PROVENTIL,VENTOLIN) 90 MCG/ACT inhaler Inhale 2 puffs into the lungs every 4 (four) hours as needed.        Marland Kitchen AMLODIPINE BESYLATE PO Take 10 mg by mouth daily. Take one tab daily        . aspirin (ASPIRIN LOW DOSE) 81 MG EC tablet Take 81 mg by mouth daily.        . budesonide-formoterol (SYMBICORT) 160-4.5 MCG/ACT inhaler Inhale 2 puffs into the lungs 2 (two) times daily.  1 Inhaler  12  . calcitRIOL (ROCALTROL) 0.25 MCG capsule Take 0.25 mcg by mouth daily.        . cloNIDine (CATAPRES-TTS-2) 0.2 mg/24hr patch Place 1 patch (0.2 mg total) onto the skin once a week. Apply one patch every 7 days    4 patch  3  . insulin aspart (NOVOLOG) 100 UNIT/ML injection Inject into the skin 3 (three) times daily before meals. Sliding scale       . insulin glargine (LANTUS SOLOSTAR) 100 UNIT/ML injection Inject into the skin at bedtime. Inject 20 units subcutaneously pm       . LABETALOL HCL PO Take 2 tablets by mouth three times daily      . ONE TOUCH ULTRA TEST test strip       . potassium chloride SA (K-DUR,KLOR-CON) 20 MEQ tablet 20 mEq 2 (two) times daily.       Marland Kitchen TERAZOSIN HCL PO Two 10mg  caps by mouth daily      . torsemide  (DEMADEX) 100 MG tablet Take 100 mg by mouth 2 (two) times daily.       . valsartan (DIOVAN) 320 MG tablet Take 1 tablet (320 mg total) by mouth daily.  30 tablet  3    Past Medical History  Diagnosis Date  . Congestive heart failure 10/2008    with preserved LV systolic function   . Aortic insufficiency 09/2008    Normal left ventricular size and function : graded as mild by Doppler , but pulse pressure is widened  . 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 Hypertension   . Chronic kidney disease (CKD), stage IV (severe) 12/2009    creatinine  2.2 in 12/2009  . Erectile dysfunction   . Colonic polyp   . Low serum testosterone level   . Tobacco abuse   . Sleep apnea 2010    evaluated by Dr. Gerilyn Pilgrim, but unable to afford CPAP  . Benign prostatic hypertrophy   . Obesity, Class I, BMI 30-34.9     Past Surgical History  Procedure Date  . Retinal laser procedure     diabetic retinopathy  . Lymph  node biopsy     surgical exploration of neck-not entirely clear that this represented a lymph node biopsy    [id number] of systems complete and found to be negative unless listed above PHYSICAL EXAM BP 154/57  Pulse 59  Resp 18  Ht 5\' 10"  (1.778 m)  Wt 253 lb (114.76 kg)  BMI 36.30 kg/m2  SpO2 97% General: Well developed, well nourished, in no acute distress Head: Eyes PERRLA, No xanthomas.   Normal cephalic and atramatic  Lungs:Clear to auscultation.  No wheezes. Heart: HRRR S1 S2, 1/6 systolic murmur,.  Pulses are 2+ & equal.  No carotid bruit. No JVD.  No abdominal bruits. No femoral bruits. Abdomen: Bowel sounds are positive, abdomen soft and non-tender without masses or   Hernia's noted. Obese. Msk:  Back mild kyphosis, in wheel chair, diminished  strength and tone for age. Extremities: No clubbing, cyanosis or edema.  DP +1 Neuro: Alert,oriented Psych:  Good affect, responds appropriately    ASSESSMENT AND PLAN

## 2010-09-28 NOTE — Patient Instructions (Signed)
   Follow up with Dr. Lodema Hong. (May need Renal Consult)  Follow up in our office in 1 months. Your physician discussed the importance of regular exercise and recommended that you start or continue a regular exercise program for good health. Your physician recommends that you return for lab work in: today.

## 2010-09-28 NOTE — Assessment & Plan Note (Signed)
Left ventricle: The cavity size was mildly dilated. Mild hypertrophy disproportionately involving the septum. Systolic function was normal. The estimated ejection fraction was in the range of 55% to 60%. Wall motion was normal; there were no regional wall motion abnormalities. Aortic valve: Mildly calcified annulus. Mildly thickened leaflets. Moderate regurgitation. Valve are2.12cm^2(VTI). Valve area: 1.9cm^2 (Vmax). Left atrium: The atrium was mildly dilated. Atrial septum: No defect or patent foramen ovale was identified. Impressions:  Compared to the prior study performed 01/19/10, aortic regurgitation to the more impressive on the present exam.  I will not make any medication changes at this time.

## 2010-09-28 NOTE — Assessment & Plan Note (Signed)
His BP is elevated today. I suspect the fluid wt gain is contributing.  I will not increase his demedex at this time or change his medications.  Spironolactone and metolazone are not recommended secondary to renal insufficiency.

## 2010-10-07 LAB — HEMOGLOBIN A1C
Hgb A1c MFr Bld: 6.2 % — ABNORMAL HIGH (ref <5.7)
Mean Plasma Glucose: 131 mg/dL — ABNORMAL HIGH (ref <117)

## 2010-10-07 LAB — BRAIN NATRIURETIC PEPTIDE: Brain Natriuretic Peptide: 270 pg/mL — ABNORMAL HIGH (ref 0.0–100.0)

## 2010-10-28 ENCOUNTER — Encounter: Payer: Self-pay | Admitting: Family Medicine

## 2010-10-29 ENCOUNTER — Encounter: Payer: Self-pay | Admitting: Cardiology

## 2010-11-02 ENCOUNTER — Encounter: Payer: Self-pay | Admitting: Family Medicine

## 2010-11-02 ENCOUNTER — Ambulatory Visit (INDEPENDENT_AMBULATORY_CARE_PROVIDER_SITE_OTHER): Payer: Medicare Other | Admitting: Family Medicine

## 2010-11-02 ENCOUNTER — Ambulatory Visit: Payer: Medicare Other | Admitting: Cardiology

## 2010-11-02 VITALS — BP 150/60 | HR 63 | Resp 16 | Ht 71.0 in | Wt 255.1 lb

## 2010-11-02 DIAGNOSIS — Z23 Encounter for immunization: Secondary | ICD-10-CM

## 2010-11-02 DIAGNOSIS — M3390 Dermatopolymyositis, unspecified, organ involvement unspecified: Secondary | ICD-10-CM

## 2010-11-02 DIAGNOSIS — M3313 Other dermatomyositis without myopathy: Secondary | ICD-10-CM

## 2010-11-02 DIAGNOSIS — F17200 Nicotine dependence, unspecified, uncomplicated: Secondary | ICD-10-CM

## 2010-11-02 DIAGNOSIS — J302 Other seasonal allergic rhinitis: Secondary | ICD-10-CM

## 2010-11-02 DIAGNOSIS — E1139 Type 2 diabetes mellitus with other diabetic ophthalmic complication: Secondary | ICD-10-CM

## 2010-11-02 DIAGNOSIS — F528 Other sexual dysfunction not due to a substance or known physiological condition: Secondary | ICD-10-CM

## 2010-11-02 DIAGNOSIS — J309 Allergic rhinitis, unspecified: Secondary | ICD-10-CM

## 2010-11-02 DIAGNOSIS — N184 Chronic kidney disease, stage 4 (severe): Secondary | ICD-10-CM

## 2010-11-02 DIAGNOSIS — I10 Essential (primary) hypertension: Secondary | ICD-10-CM

## 2010-11-02 DIAGNOSIS — N529 Male erectile dysfunction, unspecified: Secondary | ICD-10-CM | POA: Insufficient documentation

## 2010-11-02 MED ORDER — FLUTICASONE PROPIONATE 50 MCG/ACT NA SUSP: 2.0000 | Freq: Every day | NASAL | Status: DC

## 2010-11-02 MED ORDER — SILDENAFIL CITRATE 50 MG PO TABS: 50.0000 mg | ORAL_TABLET | ORAL | Status: DC | PRN

## 2010-11-02 NOTE — Patient Instructions (Addendum)
F/u in 3.5 months.  Flu vaccine today.  You need to start the januavia as per the endocrinologist.  It is impt to continue to see the kidney doc.regularly  New med are flonase for allergies and  viagra for eD  Use saline nasal flushes 2 to 3 times daily for nasal congestion

## 2010-11-04 ENCOUNTER — Other Ambulatory Visit: Payer: Self-pay | Admitting: *Deleted

## 2010-11-04 MED ORDER — TERAZOSIN HCL 10 MG PO CAPS: 20.0000 mg | ORAL_CAPSULE | Freq: Every day | ORAL | Status: DC

## 2010-11-05 ENCOUNTER — Telehealth: Payer: Self-pay | Admitting: Cardiology

## 2010-11-05 ENCOUNTER — Other Ambulatory Visit: Payer: Self-pay | Admitting: *Deleted

## 2010-11-05 MED ORDER — CLONIDINE HCL 0.2 MG PO TABS: 0.2000 mg | ORAL_TABLET | Freq: Two times a day (BID) | ORAL | Status: DC

## 2010-11-05 NOTE — Telephone Encounter (Signed)
Patient is wanting Clonidine patch back to pill. If you can do this, please call it in to [facility]. / tg

## 2010-11-09 NOTE — Assessment & Plan Note (Signed)
Unchanged , cessation counseling done 

## 2010-11-09 NOTE — Progress Notes (Signed)
  Subjective:    Patient ID: [name], male    DOB: [birth year], 65 y.o.   MRN: [id number]  HPI The PT is here for follow up and re-evaluation of chronic medical conditions, medication management and review of any available recent lab and radiology data.  Preventive health is updated, specifically  Cancer screening and Immunization.   Questions or concerns regarding consultations or procedures which the PT has had in the interim are  addressed. The PT denies any adverse reactions to current medications since the last visit.  C/o worsening ED and wants medication for this, finds the situation depressing      Review of Systems See HPI Denies recent fever or chills. Denies sinus pressure,  ear pain or sore throat.Increased nasal congestion with clear drainage and uncontrolled allergy symptoms x 2 weeks Denies chest congestion, productive cough or wheezing. Denies chest pains, palpitations and leg swelling Denies abdominal pain, nausea, vomiting,diarrhea or constipation.   Denies dysuria, frequency, hesitancy or incontinence. Denies joint pain, swelling and limitation in mobility. Denies headaches, seizures, numbness, or tingling. Mild depression,not suicidal or homicidal, mild  anxiety  Denies skin break down or rash.        Objective:   Physical Exam Patient alert and oriented and in no cardiopulmonary distress.  HEENT: No facial asymmetry, EOMI, no sinus tenderness,  oropharynx pink and moist.  Neck supple no adenopathy.Erythema and edema of nasal mucosa Chest: Clear to auscultation bilaterally.  CVS: S1, S2 no murmurs, no S3.  ABD: Soft non tender. Bowel sounds normal.  Ext: No edema  MS: Adequate ROM spine, shoulders, hips and knees.  Skin: Intact, no ulcerations or rash noted.  Psych: Good eye contact, normal affect. Memory intact not anxious or depressed appearing.  CNS: CN 2-12 intact, power, tone and sensation normal throughout.        Assessment &  Plan:

## 2010-11-09 NOTE — Assessment & Plan Note (Signed)
Uncontrolled, will defer med adjustment to nephrology, pt is on multiple meds at high doses, and reports compliance

## 2010-11-09 NOTE — Assessment & Plan Note (Signed)
Worsened , and a source of anxiety /depression, viagra prescribed, pt alerted to potential adverse s/e includingdisturbance in vision

## 2010-11-09 NOTE — Assessment & Plan Note (Signed)
Controlled, but pt has asymptomatic hypoglycemia , which is concerning, BG in office today is in the 40's and he is asymptomatic. Currently followed by endo. Has been advised to start [place] , but has not yet complied

## 2010-11-09 NOTE — Assessment & Plan Note (Signed)
Uncontrolled symptoms, med prescribed

## 2010-11-09 NOTE — Assessment & Plan Note (Signed)
Importance of following with nephrology stressed

## 2010-11-16 ENCOUNTER — Encounter: Payer: Self-pay | Admitting: Cardiology

## 2010-11-18 ENCOUNTER — Encounter: Payer: Self-pay | Admitting: Cardiology

## 2010-11-18 ENCOUNTER — Other Ambulatory Visit: Payer: Self-pay | Admitting: *Deleted

## 2010-11-18 ENCOUNTER — Encounter: Payer: Self-pay | Admitting: *Deleted

## 2010-11-18 ENCOUNTER — Ambulatory Visit (INDEPENDENT_AMBULATORY_CARE_PROVIDER_SITE_OTHER): Payer: PRIVATE HEALTH INSURANCE | Admitting: Cardiology

## 2010-11-18 VITALS — BP 149/59 | HR 63 | Resp 16 | Ht 70.0 in | Wt 251.0 lb

## 2010-11-18 DIAGNOSIS — I509 Heart failure, unspecified: Secondary | ICD-10-CM

## 2010-11-18 DIAGNOSIS — F329 Major depressive disorder, single episode, unspecified: Secondary | ICD-10-CM

## 2010-11-18 DIAGNOSIS — Z87898 Personal history of other specified conditions: Secondary | ICD-10-CM

## 2010-11-18 DIAGNOSIS — G4730 Sleep apnea, unspecified: Secondary | ICD-10-CM

## 2010-11-18 DIAGNOSIS — E1139 Type 2 diabetes mellitus with other diabetic ophthalmic complication: Secondary | ICD-10-CM

## 2010-11-18 DIAGNOSIS — R609 Edema, unspecified: Secondary | ICD-10-CM

## 2010-11-18 DIAGNOSIS — I359 Nonrheumatic aortic valve disorder, unspecified: Secondary | ICD-10-CM

## 2010-11-18 DIAGNOSIS — I10 Essential (primary) hypertension: Secondary | ICD-10-CM

## 2010-11-18 DIAGNOSIS — E785 Hyperlipidemia, unspecified: Secondary | ICD-10-CM

## 2010-11-18 DIAGNOSIS — N184 Chronic kidney disease, stage 4 (severe): Secondary | ICD-10-CM

## 2010-11-18 DIAGNOSIS — F17200 Nicotine dependence, unspecified, uncomplicated: Secondary | ICD-10-CM

## 2010-11-18 DIAGNOSIS — I5031 Acute diastolic (congestive) heart failure: Secondary | ICD-10-CM

## 2010-11-18 MED ORDER — PRAVASTATIN SODIUM 40 MG PO TABS: 40.0000 mg | ORAL_TABLET | Freq: Every day | ORAL | Status: DC

## 2010-11-18 NOTE — Assessment & Plan Note (Signed)
Fluid status appears to be well managed with high-dose diuretics.  Patient actually asked if he might not need more torsemide, but I reassured him that his current dosage is high and appears adequate.  He will continue to monitor weights and report any significant increase.

## 2010-11-18 NOTE — Patient Instructions (Signed)
Your physician has recommended you make the following change in your medication:   Add Pravastatin 40 mg daily  Your physician recommends that you return for lab work in: 2 months (CBC, CMET, Lipids)  Your physician has requested that you have a carotid duplex. This test is an ultrasound of the carotid arteries in your neck. It looks at blood flow through these arteries that supply the brain with blood. Allow one hour for this exam. There are no restrictions or special instructions.  Your physician has requested that you have an echocardiogram in 11 months. Echocardiography is a painless test that uses sound waves to create images of your heart. It provides your doctor with information about the size and shape of your heart and how well your heart's chambers and valves are working. This procedure takes approximately one hour. There are no restrictions for this procedure.  Home Blood Pressures and return in 1 month for a blood pressure check

## 2010-11-18 NOTE — Assessment & Plan Note (Signed)
Aortic valve disease appears stable by examination.  An echocardiogram one year ago verified mild stenosis and significant aortic insufficiency.  We will continue to follow clinically and with serial echocardiograms, the next to be obtained in 11 months.

## 2010-11-18 NOTE — Assessment & Plan Note (Signed)
CHF is compensated with a very high dose of diuretic.  He currently appears to be at his dry weight.

## 2010-11-18 NOTE — Assessment & Plan Note (Signed)
Control of diabetes is superb with treatment directed by Dr. Marquis Lunch.

## 2010-11-18 NOTE — Assessment & Plan Note (Signed)
Renal dysfunction is perhaps the medical issue most likely to lead to problems in the intermediate-term.  Nonetheless, creatinine is actually lower than it has been for some time.  [facility] will continue to manage treatment of kidney disease.

## 2010-11-18 NOTE — Progress Notes (Signed)
HPI : [name] returns to the office as scheduled for continued assessment and treatment of aortic valve disease and hypertension in the setting of chronic kidney disease and diabetes.  Since his last visit, he has continued to do quite well.  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  He denies chest discomfort, dyspnea on exertion, orthopnea, palpitations, PND or syncope.  He is followed by Dr. Briant Cedar, who has found renal disease to be stable.  Diabetic control has been excellent.  Patient has not followed blood pressures outside of medical facilities.  He switched from transdermal to oral clonidine to realize a significant savings in cost, but has developed dry mouth and sedation.  Current Outpatient Prescriptions on File Prior to Visit  Medication Sig Dispense Refill  . albuterol (PROVENTIL,VENTOLIN) 90 MCG/ACT inhaler Inhale 2 puffs into the lungs every 4 (four) hours as needed.        Marland Kitchen allopurinol (ZYLOPRIM) 100 MG tablet Take 100 mg by mouth daily.        Marland Kitchen AMLODIPINE BESYLATE PO Take 10 mg by mouth daily. Take one tab daily        . aspirin (ASPIRIN LOW DOSE) 81 MG EC tablet Take 81 mg by mouth daily.        . budesonide-formoterol (SYMBICORT) 160-4.5 MCG/ACT inhaler Inhale 2 puffs into the lungs 2 (two) times daily.  1 Inhaler  12  . calcitRIOL (ROCALTROL) 0.25 MCG capsule Take 0.25 mcg by mouth daily.        . cloNIDine (CATAPRES) 0.2 MG tablet Take 1 tablet (0.2 mg total) by mouth 2 (two) times daily.  60 tablet  12  . fluticasone (FLONASE) 50 MCG/ACT nasal spray Place 2 sprays into the nose daily.  16 g  2  . LABETALOL HCL PO Take 2 tablets by mouth three times daily      . ONE TOUCH ULTRA TEST test strip       . potassium chloride SA (K-DUR,KLOR-CON) 20 MEQ tablet 20 mEq 2 (two) times daily.       Marland Kitchen terazosin (HYTRIN) 10 MG capsule Take 2 capsules (20 mg total) by mouth at bedtime.  60 capsule  12  . torsemide (DEMADEX) 100 MG tablet Take 100 mg by mouth 2 (two)  times daily.       . valsartan (DIOVAN) 320 MG tablet Take 1 tablet (320 mg total) by mouth daily.  30 tablet  3     Allergies  Allergen Reactions  . Ace Inhibitors     REACTION: cough      Past medical history, social history, and family history reviewed and updated.  ROS: See history of present illness.  PHYSICAL EXAM: BP 149/59  Pulse 63  Resp 16  Ht 5\' 10"  (1.778 m)  Wt 113.853 kg (251 lb)  BMI 36.01 kg/m2  General-Well developed; no acute distress Body habitus-overweight Neck-No JVD; bilateral carotid bruits, more prominent on the left, vs transmitted murmur Lungs-clear lung fields; resonant to percussion; decreased breath sounds at the right base Cardiovascular-normal PMI; normal S1 and S2; grade 2-3/6 systolic ejection murmur at the cardiac base; grade 2-3/6;blowing diastolic blowing murmur at the left sternal border and apex Abdomen-normal bowel sounds; soft and non-tender without masses or organomegaly Musculoskeletal-No deformities, no cyanosis or clubbing Neurologic-Normal cranial nerves; symmetric strength and tone Skin-Warm, no significant lesions Extremities-distal pulses intact; trace edema  ASSESSMENT AND PLAN:

## 2010-11-18 NOTE — Assessment & Plan Note (Addendum)
Although lipid profile was fairly good in the absence of treatment, including a very high HDL level, risk for atherosclerosis is substantial, and addition of a statin to his medical regime as appropriate.  Pravastatin will be started at a dose of 40 mg per day with a repeat lipid profile to be obtained in the near future.

## 2010-11-20 ENCOUNTER — Encounter: Payer: Self-pay | Admitting: Cardiology

## 2010-11-25 ENCOUNTER — Ambulatory Visit (HOSPITAL_COMMUNITY)
Admission: RE | Admit: 2010-11-25 | Discharge: 2010-11-25 | Disposition: A | Discharge status: Home / Self Care | Payer: 59 | Source: Ambulatory Visit | Attending: Cardiology | Admitting: Cardiology

## 2010-11-25 DIAGNOSIS — R0989 Other specified symptoms and signs involving the circulatory and respiratory systems: Secondary | ICD-10-CM | POA: Insufficient documentation

## 2010-11-25 DIAGNOSIS — F17200 Nicotine dependence, unspecified, uncomplicated: Secondary | ICD-10-CM | POA: Insufficient documentation

## 2010-12-07 ENCOUNTER — Telehealth: Payer: Self-pay | Admitting: Cardiology

## 2010-12-07 ENCOUNTER — Telehealth: Payer: Self-pay | Admitting: *Deleted

## 2010-12-07 NOTE — Telephone Encounter (Signed)
Received call from [facility] with [facility], following patient in the Heart Failure Program.  [name] reports daily his weight and there has been a 5 pound weight gain over the last 3 days.  She is faxing more information, confirmed fax number.  She wanted to make sure Dr. Dietrich Pates is aware of patient weight gain.

## 2010-12-07 NOTE — Telephone Encounter (Signed)
Patient has had 5 pound weight gain, per CHF nurse.  Please advise.

## 2010-12-10 ENCOUNTER — Other Ambulatory Visit: Payer: Self-pay | Admitting: *Deleted

## 2010-12-10 ENCOUNTER — Telehealth: Payer: Self-pay | Admitting: *Deleted

## 2010-12-10 DIAGNOSIS — I509 Heart failure, unspecified: Secondary | ICD-10-CM

## 2010-12-10 NOTE — Telephone Encounter (Signed)
Spoke with patient regarding weight gain.  States he has had a dry mouth since starting on clonidine and had been drinking a lot of water over that period of time.  States that he backed off on his fluids and is now back down to his baseline.  He is enrolled in a remote CHF program at this time.  Will obtain past due lab work and patient has been advised.

## 2010-12-16 LAB — BASIC METABOLIC PANEL
BUN: 31 mg/dL — ABNORMAL HIGH (ref 6–23)
Calcium: 8.8 mg/dL (ref 8.4–10.5)
Glucose, Bld: 105 mg/dL — ABNORMAL HIGH (ref 70–99)
Potassium: 3.9 mEq/L (ref 3.5–5.3)
Sodium: 142 mEq/L (ref 135–145)

## 2010-12-20 ENCOUNTER — Ambulatory Visit (INDEPENDENT_AMBULATORY_CARE_PROVIDER_SITE_OTHER): Payer: 59

## 2010-12-20 VITALS — BP 145/54 | HR 66 | Ht 70.0 in | Wt 249.0 lb

## 2010-12-20 DIAGNOSIS — I10 Essential (primary) hypertension: Secondary | ICD-10-CM

## 2010-12-20 NOTE — Progress Notes (Signed)
**Note De-Identified  Obfuscation** S: Pt. Arrives in office for a 1 month BP check with nurse. B: On last OV with Dr. Dietrich Pates on 11-18-10 pt. was advised to start taking Pravastatin 40 mg qhs for hyperlipidemia, have CBC, CMET and lipids drawn in 2 months, have Carotid Duplex and to return to office today for BP check. A: Pt. c/o occasional swelling in feet, otherwise he has no complaints at this time. His BP is 145/54 and on last OV BP was 149/59. He did not bring his meds/list but states he is taking his medications as directed, he also did not bring a BP diary. Carotid Duplex results were normal/stable.  R: Pt. Advised to continue on current medical treatment and that we will contact him with Dr. Marvel Plan recommendations./LV

## 2011-01-18 DIAGNOSIS — A048 Other specified bacterial intestinal infections: Secondary | ICD-10-CM

## 2011-01-18 HISTORY — DX: Other specified bacterial intestinal infections: A04.8

## 2011-01-18 HISTORY — PX: COLONOSCOPY: SHX174

## 2011-01-18 HISTORY — PX: ESOPHAGOGASTRODUODENOSCOPY: SHX1529

## 2011-01-21 ENCOUNTER — Other Ambulatory Visit: Payer: Self-pay | Admitting: *Deleted

## 2011-01-21 MED ORDER — POTASSIUM CHLORIDE CRYS ER 20 MEQ PO TBCR: 20.0000 meq | EXTENDED_RELEASE_TABLET | Freq: Two times a day (BID) | ORAL | Status: DC

## 2011-02-01 ENCOUNTER — Encounter: Payer: Self-pay | Admitting: *Deleted

## 2011-02-03 ENCOUNTER — Ambulatory Visit (INDEPENDENT_AMBULATORY_CARE_PROVIDER_SITE_OTHER): Payer: 59 | Admitting: Family Medicine

## 2011-02-03 ENCOUNTER — Encounter: Payer: Self-pay | Admitting: Family Medicine

## 2011-02-03 ENCOUNTER — Other Ambulatory Visit: Payer: Self-pay | Admitting: *Deleted

## 2011-02-03 VITALS — BP 140/58 | HR 58 | Resp 16 | Ht 71.0 in | Wt 251.0 lb

## 2011-02-03 DIAGNOSIS — I509 Heart failure, unspecified: Secondary | ICD-10-CM

## 2011-02-03 DIAGNOSIS — N529 Male erectile dysfunction, unspecified: Secondary | ICD-10-CM

## 2011-02-03 DIAGNOSIS — N184 Chronic kidney disease, stage 4 (severe): Secondary | ICD-10-CM

## 2011-02-03 DIAGNOSIS — M25572 Pain in left ankle and joints of left foot: Secondary | ICD-10-CM

## 2011-02-03 DIAGNOSIS — I5031 Acute diastolic (congestive) heart failure: Secondary | ICD-10-CM

## 2011-02-03 DIAGNOSIS — F17200 Nicotine dependence, unspecified, uncomplicated: Secondary | ICD-10-CM

## 2011-02-03 DIAGNOSIS — I10 Essential (primary) hypertension: Secondary | ICD-10-CM

## 2011-02-03 DIAGNOSIS — E785 Hyperlipidemia, unspecified: Secondary | ICD-10-CM

## 2011-02-03 DIAGNOSIS — M25579 Pain in unspecified ankle and joints of unspecified foot: Secondary | ICD-10-CM

## 2011-02-03 MED ORDER — INDOMETHACIN 50 MG PO CAPS: ORAL_CAPSULE | ORAL | Status: DC

## 2011-02-03 MED ORDER — LABETALOL HCL 300 MG PO TABS: 300.0000 mg | ORAL_TABLET | Freq: Three times a day (TID) | ORAL | Status: DC

## 2011-02-03 NOTE — Patient Instructions (Addendum)
F/u in 3 month  Medicationis sent in for ankle pain and swelling.  You will be referred for a case worker to discuss your medical care with you Blood pressure , is excellent  You need to quit smoking.  Please  work on weight loss

## 2011-02-03 NOTE — Progress Notes (Signed)
  Subjective:    Patient ID: [name], male    DOB: May 24, 1945, 66 y.o.   MRN: [id number]  HPI  3 day h/o swollen painful left ankle.No inciting trauma Poor energy , concerned about testosterone level as it relates to poor energy and Ed, however, when he is made aware of need for lifelong treatment if indicated is backing away from this. From recent report from endo, his blood sugars are controlled, but pt essentially is still leading his med management Denies symptoms of heart failure flare, no PND, or orthopnea, he has chronic left leg swelling , this is unchanged  Review of Systems See HPI Denies recent fever or chills. Denies sinus pressure, nasal congestion, ear pain or sore throat. Denies chest congestion, productive cough or wheezing. Denies chest pains, palpitations and leg swelling Denies abdominal pain, nausea, vomiting,diarrhea or constipation.   Denies dysuria, frequency, hesitancy or incontinence.  Denies headaches, seizures, numbness, or tingling. Denies uncontrolled  Depression or  anxiety concerned about poor erectile function       Objective:   Physical Exam Patient alert and oriented and in no cardiopulmonary distress.  HEENT: No facial asymmetry, EOMI, no sinus tenderness,  oropharynx pink and moist.  Neck supple no adenopathy.  Chest: Clear to auscultation bilaterally.  CVS: S1, S2 no murmurs, no S3.  ABD: Soft non tender. Bowel sounds normal.  Ext: No edema  MS:  Though reduced ROM spine, shoulders, hips and knees.Swollen left ankle, mildly tender , with reduced ROM  Skin: Intact, no ulcerations or rash noted.  Psych: Good eye contact, normal affect. Memory intact not anxious or depressed appearing.  CNS: CN 2-12 intact, power, tone and sensation normal throughout.        Assessment & Plan:

## 2011-02-03 NOTE — Progress Notes (Signed)
Patient ID: [name], male   DOB: [birth year], 66 y.o.   MRN: [id number]  We verified with pharmacy the patient has been filling his prescriptions regularly.  Nonetheless, blood pressure is not optimally controlled.  If he is not experiencing dry mouth or sedation, increase clonidine to 0.3 mg twice a day.  If he is not or does not tolerate clonidine, increase labetalol to 450 mg 3 times a day. Continue to measure home blood pressures. Blood pressure check in one month.

## 2011-02-04 ENCOUNTER — Encounter: Payer: Self-pay | Admitting: *Deleted

## 2011-02-06 NOTE — Assessment & Plan Note (Signed)
Controlled, no change in medication  

## 2011-02-06 NOTE — Assessment & Plan Note (Signed)
Acute flair, short indocid taper

## 2011-02-06 NOTE — Assessment & Plan Note (Signed)
Compliant with no decompensation in the past 6 to 8 months

## 2011-02-06 NOTE — Assessment & Plan Note (Signed)
Persistent and a source of some depression, at one time expressed interest in testosterone supplement if indicated, however , when I explained that this would be lifelong , he declined testing

## 2011-02-06 NOTE — Assessment & Plan Note (Signed)
States he wants to quit but finds this difficult, encouraged to work on quitting

## 2011-02-06 NOTE — Assessment & Plan Note (Signed)
Pt encouraged to continue to follow with nephrologist. He continues to express concerns about "fragmented care, seeing multiple specialists"I explained to him that we are all working together to optimize his care, uncertain if the financial toll of various visits is his concern, will as case worker to meet with him to spend additional time with him so he fully undersatnds the importance and benefits of his specialist care

## 2011-02-07 ENCOUNTER — Telehealth: Payer: Self-pay | Admitting: Family Medicine

## 2011-02-07 NOTE — Telephone Encounter (Signed)
noted 

## 2011-02-07 NOTE — Telephone Encounter (Signed)
Will make Dr aware

## 2011-02-08 ENCOUNTER — Other Ambulatory Visit: Payer: Self-pay | Admitting: *Deleted

## 2011-02-08 DIAGNOSIS — E782 Mixed hyperlipidemia: Secondary | ICD-10-CM

## 2011-02-08 DIAGNOSIS — I10 Essential (primary) hypertension: Secondary | ICD-10-CM

## 2011-02-10 ENCOUNTER — Telehealth: Payer: Self-pay | Admitting: *Deleted

## 2011-02-10 LAB — CBC
MCV: 91.8 fL (ref 78.0–100.0)
Platelets: 222 10*3/uL (ref 150–400)
RBC: 3.89 MIL/uL — ABNORMAL LOW (ref 4.22–5.81)
RDW: 14.6 % (ref 11.5–15.5)
WBC: 7.1 10*3/uL (ref 4.0–10.5)

## 2011-02-10 LAB — COMPREHENSIVE METABOLIC PANEL
ALT: 10 U/L (ref 0–53)
AST: 12 U/L (ref 0–37)
Albumin: 3.9 g/dL (ref 3.5–5.2)
CO2: 26 mEq/L (ref 19–32)
Calcium: 8.8 mg/dL (ref 8.4–10.5)
Chloride: 107 mEq/L (ref 96–112)
Creat: 2.67 mg/dL — ABNORMAL HIGH (ref 0.50–1.35)
Potassium: 3.5 mEq/L (ref 3.5–5.3)
Sodium: 144 mEq/L (ref 135–145)
Total Protein: 6 g/dL (ref 6.0–8.3)

## 2011-02-10 LAB — LIPID PANEL: Cholesterol: 164 mg/dL (ref 0–200)

## 2011-02-10 MED ORDER — LABETALOL HCL 300 MG PO TABS: ORAL_TABLET | ORAL | Status: DC

## 2011-02-10 NOTE — Telephone Encounter (Signed)
Made patient aware of new medication recommendations by Dr Dietrich Pates.  Verbalizes understanding.

## 2011-02-11 ENCOUNTER — Telehealth: Payer: Self-pay

## 2011-02-11 NOTE — Telephone Encounter (Signed)
noted 

## 2011-02-19 ENCOUNTER — Other Ambulatory Visit: Payer: Self-pay | Admitting: Family Medicine

## 2011-02-21 ENCOUNTER — Other Ambulatory Visit: Payer: Self-pay | Admitting: Cardiology

## 2011-02-23 ENCOUNTER — Telehealth: Payer: Self-pay

## 2011-02-23 NOTE — Telephone Encounter (Signed)
Pt still having the gout flare. Request refill of Indocin and rx for colcrys

## 2011-02-28 NOTE — Telephone Encounter (Signed)
Advise since his kidney function is not good I do not recommend he keeps taking medication repeatedly that is toxic to the kidneys, better for him to see ortho or rheumatologist to get injection to the joint. Also let him know the social worker /case worker from [facility] has tried to contact him to go over care co ordination, but report that unable to get in touch

## 2011-02-28 NOTE — Telephone Encounter (Signed)
States he has not asked for that so I will let pharmacy know

## 2011-03-07 ENCOUNTER — Telehealth: Payer: Self-pay | Admitting: Family Medicine

## 2011-03-07 ENCOUNTER — Other Ambulatory Visit: Payer: Self-pay | Admitting: Family Medicine

## 2011-03-07 NOTE — Telephone Encounter (Signed)
Pt stated that he would like referral to ortho or rheumatologist for gout.

## 2011-03-07 NOTE — Telephone Encounter (Signed)
Pls refer pt to rheumatologist who cann  See him soon, try dr truslow pls re management of gout  With renal disease, let him know referral is placed pls

## 2011-03-08 NOTE — Telephone Encounter (Signed)
Pt was referred to dr. Kellie Simmering office. They will call pt with appt and time. Called and left message for pt to call office

## 2011-03-15 ENCOUNTER — Ambulatory Visit (INDEPENDENT_AMBULATORY_CARE_PROVIDER_SITE_OTHER): Payer: 59 | Admitting: *Deleted

## 2011-03-15 VITALS — BP 128/50 | HR 68 | Ht 70.0 in | Wt 252.0 lb

## 2011-03-15 DIAGNOSIS — R55 Syncope and collapse: Secondary | ICD-10-CM

## 2011-03-15 DIAGNOSIS — I10 Essential (primary) hypertension: Secondary | ICD-10-CM

## 2011-03-15 NOTE — Progress Notes (Signed)
Patient presents today for blood pressure check.  Brought blood pressure list.  Medications reconciled.  Reports taking all medications as prescribed, with the exception of pravastatin, which he has not had x 1 month.  States that he had two "passing out spells", which lasted mere seconds where he did not lose consciousness.  States this has not happened since stopping pravastatin.  Reports having a cousin who experienced the same reaction with this medication.  Is not currently on a replacement cholesterol agent.  Due to above complaint, a rhythm strip was performed for review, which was normal sinus rhythm.  Follow up visit is not due until November.  Has 2 + edema in left ankle, which he states comes and goes.  No more than a 1 lb weight gain noted since last OV in Nov.  Denies SOB or chest discomfort.

## 2011-03-20 NOTE — Progress Notes (Signed)
Patient ID: [name], male   DOB: [birth year], 66 y.o.   MRN: [id number] Labetalol added in late January with excellent response.  Over the past 3 weeks, systolic has not exceeded 143 mmHg, which remains slightly above goal in light of the fact the patient has diabetes.  As he is already being treated with 6 antihypertensive medications, I am not inclined to get another for the minimal benefit that would result from another 5-10 mm Hg decrease in systolic blood pressure.  Treatment with a statin is desirable in this individual with diabetes and multiple additional cardiovascular risk factors.  Since he is disinclined to take pravastatin, simvastatin will be started at a dose of 10 mg per day due to treatment with a number of agents that would tend to raise blood levels of that drug.  Rhythm Strip:  Normal sinus rhythm at a rate of 67 bpm.  Plan: Discontinue pravastatin           Simvastatin 10 mg per day           Fasting lipid profile in one month           Continue current antihypertensive medications

## 2011-03-21 ENCOUNTER — Encounter: Payer: Self-pay | Admitting: *Deleted

## 2011-03-21 ENCOUNTER — Other Ambulatory Visit: Payer: Self-pay | Admitting: *Deleted

## 2011-03-21 DIAGNOSIS — E782 Mixed hyperlipidemia: Secondary | ICD-10-CM

## 2011-03-21 MED ORDER — SIMVASTATIN 10 MG PO TABS: 10.0000 mg | ORAL_TABLET | Freq: Every day | ORAL | Status: DC

## 2011-03-21 NOTE — Progress Notes (Signed)
[name] wife notified of changes and further recommendations.  Verbalized understanding.

## 2011-04-05 ENCOUNTER — Encounter: Payer: Self-pay | Admitting: Cardiology

## 2011-04-11 ENCOUNTER — Other Ambulatory Visit: Payer: Self-pay | Admitting: *Deleted

## 2011-04-11 DIAGNOSIS — E782 Mixed hyperlipidemia: Secondary | ICD-10-CM

## 2011-04-18 ENCOUNTER — Other Ambulatory Visit: Payer: Self-pay | Admitting: *Deleted

## 2011-04-18 ENCOUNTER — Telehealth: Payer: Self-pay | Admitting: Cardiology

## 2011-04-18 DIAGNOSIS — R609 Edema, unspecified: Secondary | ICD-10-CM

## 2011-04-18 NOTE — Telephone Encounter (Signed)
Received a call from patient stating that he has had increased edema in legs for approx 2 weeks and a slight decrease in urine output.  States taking medications, as prescribed. Denies SOB and states edema resolves at night.  Has Lipid profile due this week, therefore a BMET was added to assess kidney function.  Follow up is not due until 11/13 with Dr Dietrich Pates.

## 2011-04-18 NOTE — Telephone Encounter (Signed)
BOTH LEGS ARE SWOLLEN HAS BEEN HAPPING FOR ABOUT A WEEK NOW GET BETTER OVER NIGHT.

## 2011-04-19 NOTE — Telephone Encounter (Signed)
Attempted to contact patient.  Message left for a return phone call.

## 2011-04-19 NOTE — Telephone Encounter (Signed)
We need to know his weight and whether he is having symptoms other than peripheral edema.  If weight is stable, and symptoms are modest, he can be seen by Ms. Lawrence next week.  Until then stress a low salt diet and leg elevation.

## 2011-04-20 NOTE — Telephone Encounter (Signed)
Message again left for patient to return call.

## 2011-04-22 ENCOUNTER — Encounter: Payer: Self-pay | Admitting: *Deleted

## 2011-04-30 LAB — BASIC METABOLIC PANEL
CO2: 26 mEq/L (ref 19–32)
Calcium: 9.4 mg/dL (ref 8.4–10.5)
Chloride: 101 mEq/L (ref 96–112)
Glucose, Bld: 103 mg/dL — ABNORMAL HIGH (ref 70–99)
Sodium: 137 mEq/L (ref 135–145)

## 2011-05-01 ENCOUNTER — Other Ambulatory Visit: Payer: Self-pay | Admitting: Cardiology

## 2011-05-01 LAB — LIPID PANEL
Cholesterol: 171 mg/dL (ref 0–200)
Triglycerides: 90 mg/dL (ref <150)
VLDL: 18 mg/dL (ref 0–40)

## 2011-05-03 ENCOUNTER — Encounter: Payer: Self-pay | Admitting: *Deleted

## 2011-05-04 ENCOUNTER — Ambulatory Visit: Payer: 59 | Admitting: Family Medicine

## 2011-05-05 ENCOUNTER — Other Ambulatory Visit: Payer: Self-pay | Admitting: Nephrology

## 2011-05-05 DIAGNOSIS — N184 Chronic kidney disease, stage 4 (severe): Secondary | ICD-10-CM

## 2011-05-06 ENCOUNTER — Ambulatory Visit
Admission: RE | Admit: 2011-05-06 | Discharge: 2011-05-06 | Disposition: A | Discharge status: Home / Self Care | Payer: Medicare Other | Source: Ambulatory Visit | Attending: Nephrology | Admitting: Nephrology

## 2011-05-06 DIAGNOSIS — N184 Chronic kidney disease, stage 4 (severe): Secondary | ICD-10-CM

## 2011-05-10 ENCOUNTER — Ambulatory Visit: Payer: 59 | Admitting: Family Medicine

## 2011-05-12 ENCOUNTER — Ambulatory Visit (INDEPENDENT_AMBULATORY_CARE_PROVIDER_SITE_OTHER): Payer: Medicare Other | Admitting: Family Medicine

## 2011-05-12 ENCOUNTER — Encounter: Payer: Self-pay | Admitting: Family Medicine

## 2011-05-12 VITALS — BP 130/40 | HR 64 | Resp 16 | Ht 71.0 in | Wt 242.0 lb

## 2011-05-12 DIAGNOSIS — N184 Chronic kidney disease, stage 4 (severe): Secondary | ICD-10-CM

## 2011-05-12 DIAGNOSIS — R55 Syncope and collapse: Secondary | ICD-10-CM

## 2011-05-12 DIAGNOSIS — H579 Unspecified disorder of eye and adnexa: Secondary | ICD-10-CM

## 2011-05-12 DIAGNOSIS — E1139 Type 2 diabetes mellitus with other diabetic ophthalmic complication: Secondary | ICD-10-CM

## 2011-05-12 DIAGNOSIS — E785 Hyperlipidemia, unspecified: Secondary | ICD-10-CM

## 2011-05-12 DIAGNOSIS — I10 Essential (primary) hypertension: Secondary | ICD-10-CM

## 2011-05-12 DIAGNOSIS — Z125 Encounter for screening for malignant neoplasm of prostate: Secondary | ICD-10-CM

## 2011-05-12 DIAGNOSIS — Z1211 Encounter for screening for malignant neoplasm of colon: Secondary | ICD-10-CM

## 2011-05-12 DIAGNOSIS — R5383 Other fatigue: Secondary | ICD-10-CM

## 2011-05-12 DIAGNOSIS — J329 Chronic sinusitis, unspecified: Secondary | ICD-10-CM

## 2011-05-12 LAB — COMPLETE METABOLIC PANEL WITH GFR
Albumin: 3.9 g/dL (ref 3.5–5.2)
Alkaline Phosphatase: 102 U/L (ref 39–117)
BUN: 88 mg/dL — ABNORMAL HIGH (ref 6–23)
CO2: 30 mEq/L (ref 19–32)
GFR, Est African American: 12 mL/min — ABNORMAL LOW
GFR, Est Non African American: 10 mL/min — ABNORMAL LOW
Glucose, Bld: 89 mg/dL (ref 70–99)
Total Bilirubin: 0.4 mg/dL (ref 0.3–1.2)
Total Protein: 7.2 g/dL (ref 6.0–8.3)

## 2011-05-12 LAB — HEMOGLOBIN A1C: Hgb A1c MFr Bld: 6.6 % — ABNORMAL HIGH (ref <5.7)

## 2011-05-12 LAB — CBC WITH DIFFERENTIAL/PLATELET
Basophils Relative: 0 % (ref 0–1)
Eosinophils Absolute: 0 10*3/uL (ref 0.0–0.7)
Eosinophils Relative: 0 % (ref 0–5)
HCT: 26.1 % — ABNORMAL LOW (ref 39.0–52.0)
Hemoglobin: 8.1 g/dL — ABNORMAL LOW (ref 13.0–17.0)
Lymphs Abs: 0.5 10*3/uL — ABNORMAL LOW (ref 0.7–4.0)
MCH: 27.3 pg (ref 26.0–34.0)
MCHC: 31 g/dL (ref 30.0–36.0)
MCV: 87.9 fL (ref 78.0–100.0)
Monocytes Absolute: 1 10*3/uL (ref 0.1–1.0)
Monocytes Relative: 7 % (ref 3–12)
RBC: 2.97 MIL/uL — ABNORMAL LOW (ref 4.22–5.81)

## 2011-05-12 MED ORDER — PENICILLIN V POTASSIUM 500 MG PO TABS: 500.0000 mg | ORAL_TABLET | Freq: Three times a day (TID) | ORAL | Status: DC

## 2011-05-12 NOTE — Patient Instructions (Addendum)
F/u in 6 weeks.  Stat cbc and diff, cmp and EGFR, HBA1C   You are referred  To cardiology since you recently passed out though blood sugar was normal  Stop lantus please   You have gallstones and will need gall bladder removed.  I will refer you to Dr Kendell Bane since yo c/o early satiety and change in bowel movements, with "wormy" looking stool in the past 6 month  I will send labs today to your nephrologist

## 2011-05-12 NOTE — Progress Notes (Signed)
  Subjective:    Patient ID: [name], male    DOB: 04-14-1945, 66 y.o.   MRN: [id number]  HPI Pt in for follow up of chronic conditions. He reports that 3 days ago, while talking to friends he lost conciousness, slumped to the ground , passed out for less than 1 minute. No h/o incontinence or jerking.Denies associated chest pain. Pt states he had just eaten, and his blood sugar was 140 when checked at the time. Since his last visit, he has had significant worsening of renal function, he is being followd by nephrology closely. He is on high dose diuretic, which he has self adjusted, he has no leg swelling, PND or orthopnea. 2 week h/o increased sinus pressure with thick drainage. No documented fever , no chills noted    Review of Systems See HPI Denies chest congestion, productive cough or wheezing. Denies chest pains, palpitations and leg swelling C/o early satiety and thin stool in the past 3 months. No visible blood in the stool Denies dysuria, frequency, hesitancy or incontinence. Denies joint pain, swelling and limitation in mobility. Denies headaches, seizures, numbness, or tingling. Denies depression, anxiety or insomnia. Denies skin break down or rash.        Objective:   Physical Exam  Patient alert and oriented and in no cardiopulmonary distress.  HEENT: No facial asymmetry, EOMI, maxillary sinus tenderness,  oropharynx pink and moist.  Neck supple no adenopathy.  Chest: Clear to auscultation bilaterally.Decreased air entry bilaterally  CVS: S1, S2 no murmurs, no S3.  ABD: Soft non tender. Bowel sounds normal.  Rectal:guaiac neg stool Ext: No edema  MS: Adequate ROM spine, shoulders, hips and knees.  Skin: Intact, no ulcerations or rash noted.  Psych: Good eye contact, normal affect. Memory intact not anxious or depressed appearing.  CNS: CN 2-12 intact, power, tone and sensation normal throughout.       Assessment & Plan:

## 2011-05-15 NOTE — Assessment & Plan Note (Signed)
Controlled, no change in medication  

## 2011-05-15 NOTE — Assessment & Plan Note (Signed)
Sinus congestion with elevated WBC  And a shift, will treat with antibiotic, pt aware

## 2011-05-15 NOTE — Assessment & Plan Note (Signed)
Recent syncopal event , not attributable to hypoglycemia, card eval

## 2011-05-15 NOTE — Assessment & Plan Note (Signed)
Deteriorated since last visit

## 2011-05-16 LAB — POC HEMOCCULT BLD/STL (OFFICE/1-CARD/DIAGNOSTIC): Fecal Occult Blood, POC: NEGATIVE

## 2011-05-16 NOTE — Progress Notes (Signed)
Addended by: Abner Greenspan on: 05/16/2011 08:44 AM   Modules accepted: Orders

## 2011-05-17 ENCOUNTER — Encounter: Payer: Self-pay | Admitting: Adult Health

## 2011-05-17 ENCOUNTER — Ambulatory Visit (INDEPENDENT_AMBULATORY_CARE_PROVIDER_SITE_OTHER): Payer: Medicare Other | Admitting: Adult Health

## 2011-05-17 VITALS — BP 161/58 | HR 65 | Resp 16 | Ht 72.0 in | Wt 237.0 lb

## 2011-05-17 DIAGNOSIS — I10 Essential (primary) hypertension: Secondary | ICD-10-CM

## 2011-05-17 DIAGNOSIS — N184 Chronic kidney disease, stage 4 (severe): Secondary | ICD-10-CM

## 2011-05-17 DIAGNOSIS — I359 Nonrheumatic aortic valve disorder, unspecified: Secondary | ICD-10-CM

## 2011-05-17 NOTE — Patient Instructions (Signed)
**Note De-identified  Obfuscation** Your physician recommends that you continue on your current medications as directed. Please refer to the Current Medication list given to you today.  Your physician recommends that you schedule a follow-up appointment in: 6 months  

## 2011-05-17 NOTE — Progress Notes (Signed)
HPI:  [name] is a pleasant 66 y/o patient of Dr. Dietrich Pates we are following with know  History of AoV disease,hypertension, diabetes and chronic kidney disease. He was seen recently by Dr. Briant Cedar, [facility], and was complaining of gout symptoms. He was started on allopurinol. Shortly thereafter, he began to have LEE. He followed up and was started on high doses of lasix, 160 mg TID in addition to torsemide.  He was only able to tolerate this for about 3-4 days when he began to have significant fatigue and weakness. He was not able to do much of anything. He stopped this on his own and followed up with Dr. Briant Cedar. Labs were completed. He was found to have Creatinine of 5.86. He was continued on torsemide with labs recheck in one week. He had a syncopal episode a few days later while standing by his truck. Lost consciousness for about 10-15 seconds. EMS came and he was found to be mildly hypotensive but was not brought to ER. He has had no other episodes since that time. Follow-up labs on 4./25/2013 demonstrate Creatinine 5.45, with Hgb of 8.1 and potassium of 3.2.  He has had labs drawn today per [facility] with results pending. He is without complaint today of pain, dizziness, DOE or near syncope.   Allergies  Allergen Reactions  . Ace Inhibitors     REACTION: cough    Current Outpatient Prescriptions  Medication Sig Dispense Refill  . AMLODIPINE BESYLATE PO Take 10 mg by mouth daily. Take one tab daily        . aspirin (ASPIRIN LOW DOSE) 81 MG EC tablet Take 81 mg by mouth daily.        . calcitRIOL (ROCALTROL) 0.25 MCG capsule Take 0.25 mcg by mouth daily.        . cloNIDine (CATAPRES) 0.2 MG tablet Take 1 tablet (0.2 mg total) by mouth 2 (two) times daily.  60 tablet  12  . labetalol (NORMODYNE) 300 MG tablet Take 600 mg by mouth 2 (two) times daily.      . ONE TOUCH ULTRA TEST test strip       . potassium chloride SA (K-DUR,KLOR-CON) 20 MEQ tablet Take 20 mEq by mouth  daily.      . pravastatin (PRAVACHOL) 40 MG tablet Take 40 mg by mouth daily.      . sitaGLIPtin (JANUVIA) 25 MG tablet Take 25 mg by mouth daily.        Marland Kitchen terazosin (HYTRIN) 10 MG capsule Take 2 capsules (20 mg total) by mouth at bedtime.  60 capsule  12  . torsemide (DEMADEX) 100 MG tablet Take 100 mg by mouth 2 (two) times daily.         Past Medical History  Diagnosis Date  . Congestive heart failure 10/2008    preserved LV systolic function   . Aortic insufficiency 09/2008    Normal left ventricular size and function : graded as mild by Doppler , but pulse pressure is widened  . 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 Hypertension   . 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 Erectile dysfunction   . Colonic polyp   . Low serum testosterone level   . Tobacco abuse   . Sleep apnea 2010    Initially unable to afford CPAP  . Benign prostatic hypertrophy   .  Obesity, Class I, BMI 30-34.9   . Hyperlipidemia     Past Surgical History  Procedure Date  . Retinal laser procedure     diabetic retinopathy  . Lymph node biopsy     surgical exploration of neck-not entirely clear that this represented a lymph node biopsy    [id number] of systems complete and found to be negative unless listed above  PHYSICAL EXAM BP 161/58  Pulse 65  Resp 16  Ht 6' (1.829 m)  Wt 237 lb (107.502 kg)  BMI 32.14 kg/m2 General: Well developed, well nourished, in no acute distress Head: Eyes PERRLA, No xanthomas.   Normal cephalic and atramatic  Lungs: Clear bilaterally to auscultation and percussion. Heart: HRRR S1 S2,2/6 systolic murmur.  Pulses are 2+ & equal.            No carotid bruit. No JVD.  No abdominal bruits. No femoral bruits. Abdomen: Bowel sounds are positive, abdomen soft and non-tender without masses or                  Hernia's noted. Msk:  Back normal, normal gait. Normal strength and tone  for age. Extremities: No clubbing, cyanosis or edema.  DP +1 Neuro: Alert and oriented X 3. Psych:  Good affect, responds appropriately    ASSESSMENT AND PLAN

## 2011-05-17 NOTE — Assessment & Plan Note (Signed)
Blood pressure mildly elevated today, and he is not orthostatic.  He remains on clonidine 0.2 mg BID, labetolol 300 mg BID, and toresemide 100 mg BID.  I will await medication adjustments per [facility] for before making further changes. He will see [place] in 3 months.

## 2011-05-17 NOTE — Assessment & Plan Note (Signed)
Creatinine significantly elevated at 5.46 by last labs one week ago. He has had a significant drop in Hgb to 8.2 from 11.7 over 3 month period. Most recent potassium in 3.2. [facility] to manage.=

## 2011-05-17 NOTE — Assessment & Plan Note (Signed)
Patient was clearly dehydrated with renal failure with over diuresis on both lasix and torsemide. 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 He is now in renal failure with close follow-up by [facility]. Will not make any medication changes in this nice gentleman and defer to nephrology for adjustments in his medications

## 2011-07-11 ENCOUNTER — Ambulatory Visit: Payer: Medicare Other | Admitting: Family Medicine

## 2011-07-12 ENCOUNTER — Ambulatory Visit (INDEPENDENT_AMBULATORY_CARE_PROVIDER_SITE_OTHER): Payer: Medicare Other | Admitting: Family Medicine

## 2011-07-12 ENCOUNTER — Other Ambulatory Visit: Payer: Self-pay | Admitting: Family Medicine

## 2011-07-12 ENCOUNTER — Encounter: Payer: Self-pay | Admitting: Family Medicine

## 2011-07-12 VITALS — BP 178/74 | HR 75 | Resp 18 | Ht 71.0 in | Wt 244.0 lb

## 2011-07-12 DIAGNOSIS — K8020 Calculus of gallbladder without cholecystitis without obstruction: Secondary | ICD-10-CM

## 2011-07-12 DIAGNOSIS — R600 Localized edema: Secondary | ICD-10-CM

## 2011-07-12 DIAGNOSIS — I509 Heart failure, unspecified: Secondary | ICD-10-CM

## 2011-07-12 DIAGNOSIS — I5022 Chronic systolic (congestive) heart failure: Secondary | ICD-10-CM

## 2011-07-12 DIAGNOSIS — E1139 Type 2 diabetes mellitus with other diabetic ophthalmic complication: Secondary | ICD-10-CM

## 2011-07-12 DIAGNOSIS — R911 Solitary pulmonary nodule: Secondary | ICD-10-CM

## 2011-07-12 DIAGNOSIS — R609 Edema, unspecified: Secondary | ICD-10-CM

## 2011-07-12 DIAGNOSIS — R630 Anorexia: Secondary | ICD-10-CM

## 2011-07-12 DIAGNOSIS — I5031 Acute diastolic (congestive) heart failure: Secondary | ICD-10-CM

## 2011-07-12 DIAGNOSIS — I10 Essential (primary) hypertension: Secondary | ICD-10-CM

## 2011-07-12 DIAGNOSIS — H579 Unspecified disorder of eye and adnexa: Secondary | ICD-10-CM

## 2011-07-12 DIAGNOSIS — R198 Other specified symptoms and signs involving the digestive system and abdomen: Secondary | ICD-10-CM | POA: Insufficient documentation

## 2011-07-12 MED ORDER — CLONIDINE HCL 0.3 MG PO TABS: 0.3000 mg | ORAL_TABLET | Freq: Two times a day (BID) | ORAL | Status: DC

## 2011-07-12 NOTE — Progress Notes (Signed)
  Subjective:    Patient ID: [name], male    DOB: 07/30/1945, 66 y.o.   MRN: [id number]  HPI 1 wek h/o increased dyspnea, wakes out of sleep unable to breathe, no chest pain , or palpitations, exertional fatigue  6 month h/o change in appetite ,  with constipation, he does have gallstones and will be referred to surgery about this as he is symptomatic, but he also needs GI evaluation.  Blood sugar is well controlled on insulin only   Review of Systems See HPI Denies recent fever or chills. Denies sinus pressure, nasal congestion, ear pain or sore throat. Denies chest congestion, productive cough or wheezing. .   Denies dysuria, frequency, hesitancy or incontinence. Denies joint pain, swelling and limitation in mobility. Denies depression, anxiety or insomnia. Denies skin break down or rash.        Objective:   Physical Exam Patient alert and oriented and in mild  cardiopulmonary distress.Apperas ill  HEENT: No facial asymmetry, EOMI, no sinus tenderness,  oropharynx pink and moist.  Neck decreased ROM,no adenopathy.No JVD  Chest: decreased air entry with bibasilar crackles   CVS: S1, S2 systolic  murmur, no S3.  ABD: Soft non tender. Bowel sounds normal.  Ext: one plus  edema  MS: Adequate though reduced  ROM spine, shoulders, hips and knees.  Skin: Intact, no ulcerations or rash noted.  Psych: Good eye contact, normal affect. Memory intact not anxious or depressed appearing.  CNS: CN 2-12 intact, power, tone and sensation normal throughout.        Assessment & Plan:

## 2011-07-12 NOTE — Patient Instructions (Addendum)
F/u in 2 month  CXR today and BNP and chem 7 today, you are describing symptoms suggestive  Of heart failure I will requests echo cardiogram in the near future   You will be referred  To surgeon in [place] about gallstones   You are referred to Dr Kendell Bane, about  Poor appetite and constipation  New dose clonidine is 0.3mg  one twice daily, blood pressure is high

## 2011-07-13 ENCOUNTER — Ambulatory Visit (HOSPITAL_COMMUNITY)
Admission: RE | Admit: 2011-07-13 | Discharge: 2011-07-13 | Disposition: A | Discharge status: Home / Self Care | Payer: Medicare Other | Source: Ambulatory Visit | Attending: Family Medicine | Admitting: Family Medicine

## 2011-07-13 DIAGNOSIS — I509 Heart failure, unspecified: Secondary | ICD-10-CM | POA: Insufficient documentation

## 2011-07-13 DIAGNOSIS — I5031 Acute diastolic (congestive) heart failure: Secondary | ICD-10-CM | POA: Insufficient documentation

## 2011-07-13 LAB — BASIC METABOLIC PANEL
BUN: 30 mg/dL — ABNORMAL HIGH (ref 6–23)
Calcium: 9 mg/dL (ref 8.4–10.5)
Creat: 3.63 mg/dL — ABNORMAL HIGH (ref 0.50–1.35)
Glucose, Bld: 73 mg/dL (ref 70–99)

## 2011-07-13 LAB — BRAIN NATRIURETIC PEPTIDE: Brain Natriuretic Peptide: 1583.3 pg/mL — ABNORMAL HIGH (ref 0.0–100.0)

## 2011-07-14 ENCOUNTER — Telehealth: Payer: Self-pay | Admitting: Family Medicine

## 2011-07-14 DIAGNOSIS — R911 Solitary pulmonary nodule: Secondary | ICD-10-CM | POA: Insufficient documentation

## 2011-07-14 NOTE — Telephone Encounter (Signed)
Discussed result with  the patient , he is aware he is being referred for chest Ct and to cardiology, please refer

## 2011-07-15 ENCOUNTER — Emergency Department (HOSPITAL_COMMUNITY)
Admission: EM | Admit: 2011-07-15 | Discharge: 2011-07-15 | Disposition: A | Discharge status: Home / Self Care | Payer: Medicare Other | Attending: Emergency Medicine | Admitting: Emergency Medicine

## 2011-07-15 ENCOUNTER — Encounter (HOSPITAL_COMMUNITY): Payer: Self-pay | Admitting: Emergency Medicine

## 2011-07-15 ENCOUNTER — Emergency Department (HOSPITAL_COMMUNITY): Payer: Medicare Other

## 2011-07-15 ENCOUNTER — Telehealth: Payer: Self-pay

## 2011-07-15 DIAGNOSIS — I129 Hypertensive chronic kidney disease with stage 1 through stage 4 chronic kidney disease, or unspecified chronic kidney disease: Secondary | ICD-10-CM | POA: Insufficient documentation

## 2011-07-15 DIAGNOSIS — G4730 Sleep apnea, unspecified: Secondary | ICD-10-CM | POA: Insufficient documentation

## 2011-07-15 DIAGNOSIS — Z6830 Body mass index (BMI) 30.0-30.9, adult: Secondary | ICD-10-CM | POA: Insufficient documentation

## 2011-07-15 DIAGNOSIS — E669 Obesity, unspecified: Secondary | ICD-10-CM | POA: Insufficient documentation

## 2011-07-15 DIAGNOSIS — Z7982 Long term (current) use of aspirin: Secondary | ICD-10-CM | POA: Insufficient documentation

## 2011-07-15 DIAGNOSIS — N184 Chronic kidney disease, stage 4 (severe): Secondary | ICD-10-CM | POA: Insufficient documentation

## 2011-07-15 DIAGNOSIS — F17200 Nicotine dependence, unspecified, uncomplicated: Secondary | ICD-10-CM | POA: Insufficient documentation

## 2011-07-15 DIAGNOSIS — I509 Heart failure, unspecified: Secondary | ICD-10-CM | POA: Insufficient documentation

## 2011-07-15 DIAGNOSIS — N289 Disorder of kidney and ureter, unspecified: Secondary | ICD-10-CM | POA: Insufficient documentation

## 2011-07-15 DIAGNOSIS — Z79899 Other long term (current) drug therapy: Secondary | ICD-10-CM | POA: Insufficient documentation

## 2011-07-15 DIAGNOSIS — E119 Type 2 diabetes mellitus without complications: Secondary | ICD-10-CM | POA: Insufficient documentation

## 2011-07-15 LAB — PRO B NATRIURETIC PEPTIDE: Pro B Natriuretic peptide (BNP): 17380 pg/mL — ABNORMAL HIGH (ref 0–125)

## 2011-07-15 LAB — COMPREHENSIVE METABOLIC PANEL
ALT: 11 U/L (ref 0–53)
Albumin: 3.4 g/dL — ABNORMAL LOW (ref 3.5–5.2)
Alkaline Phosphatase: 85 U/L (ref 39–117)
Calcium: 9 mg/dL (ref 8.4–10.5)
GFR calc Af Amer: 21 mL/min — ABNORMAL LOW (ref >90)
Potassium: 3.5 mEq/L (ref 3.5–5.1)
Sodium: 138 mEq/L (ref 135–145)
Total Protein: 6.3 g/dL (ref 6.0–8.3)

## 2011-07-15 LAB — CBC WITH DIFFERENTIAL/PLATELET
Basophils Absolute: 0.1 10*3/uL (ref 0.0–0.1)
Basophils Relative: 1 % (ref 0–1)
Eosinophils Absolute: 0.4 10*3/uL (ref 0.0–0.7)
Eosinophils Relative: 6 % — ABNORMAL HIGH (ref 0–5)
MCH: 31 pg (ref 26.0–34.0)
MCHC: 33.8 g/dL (ref 30.0–36.0)
MCV: 91.9 fL (ref 78.0–100.0)
Neutrophils Relative %: 59 % (ref 43–77)
Platelets: 197 10*3/uL (ref 150–400)
RBC: 3.32 MIL/uL — ABNORMAL LOW (ref 4.22–5.81)
RDW: 14.9 % (ref 11.5–15.5)

## 2011-07-15 LAB — CARDIAC PANEL(CRET KIN+CKTOT+MB+TROPI)
Relative Index: 1.2 (ref 0.0–2.5)
Troponin I: <0.3 ng/mL (ref <0.30)

## 2011-07-15 MED ORDER — ASPIRIN 81 MG PO CHEW
324.0000 mg | CHEWABLE_TABLET | Freq: Once | ORAL | Status: AC
  Administered 2011-07-15: 324 mg via ORAL
  Filled 2011-07-15: qty 4

## 2011-07-15 MED ORDER — FUROSEMIDE 10 MG/ML IJ SOLN
40.0000 mg | Freq: Once | INTRAMUSCULAR | Status: AC
  Administered 2011-07-15: 40 mg via INTRAVENOUS
  Filled 2011-07-15: qty 4

## 2011-07-15 NOTE — ED Notes (Signed)
Pt denies cough. States SOB x a few days. States breathing is better over past couple of days. NAD. Pt also states, "My brother-in-law have the same thing going on, he was just up here." Asked pt what relative was dx with and he states he is unsure. NAD at this time.

## 2011-07-15 NOTE — Telephone Encounter (Signed)
States he is still having problems breathing. If he is sitting still he is fine but this am he had to walk up some steps and he thought he was [place] pass out. States his stomach feels really tight and he can't breathe but after a few mins it goes away. Has been coming and going. Called [facility] where he was referred but they told him to call here. Wanted to see if an inhaler can be called in because all he has is a neb machine. -Advised if in the meantime he started having another episode to go to the ER. Pt agreed

## 2011-07-15 NOTE — Telephone Encounter (Signed)
Call and advise him he needs to go to the Ed , I will call the Ed doc please. I am not sending in an inhaler

## 2011-07-15 NOTE — ED Notes (Signed)
Pt verbalized understanding of dc instructions.

## 2011-07-15 NOTE — Telephone Encounter (Signed)
Pt advised possible heart failure and to go to the ED

## 2011-07-15 NOTE — Discharge Instructions (Signed)
Heart Failure Take your medications as prescribed. Follow up with Dr. Lodema Hong and Dr. Dietrich Pates. Return to the ED if you develop new or worsening symptoms. Heart failure (HF) is a condition in which the heart has trouble pumping blood. This means your heart does not pump blood efficiently for your body to work well. In some cases of HF, fluid may back up into your lungs or you may have swelling (edema) in your lower legs. HF is a long-term (chronic) condition. It is important for you to take good care of yourself and follow your caregiver's treatment plan. CAUSES   Health conditions:   High blood pressure (hypertension) causes the heart muscle to work harder than normal. When pressure in the blood vessels is high, the heart needs to pump (contract) with more force in order to circulate blood throughout the body. High blood pressure eventually causes the heart to become stiff and weak.   Coronary artery disease (CAD) is the buildup of cholesterol and fat (plaques) in the arteries of the heart. The blockage in the arteries deprives the heart muscle of oxygen and blood. This can cause chest pain and may lead to a heart attack. High blood pressure can also contribute to CAD.   Heart attack (myocardial infarction) occurs when 1 or more arteries in the heart become blocked. The loss of oxygen damages the muscle tissue of the heart. When this happens, part of the heart muscle dies. The injured tissue does not contract as well and weakens the heart's ability to pump blood.   Abnormal heart valves can cause HF when the heart valves do not open and close properly. This makes the heart muscle pump harder to keep the blood flowing.   Heart muscle disease (cardiomyopathy or myocarditis) is damage to the heart muscle from a variety of causes. These can include drug or alcohol abuse, infections, or unknown reasons. These can increase the risk of HF.   Lung disease makes the heart work harder because the lungs do  not work properly. This can cause a strain on the heart leading it to fail.   Diabetes increases the risk of HF. High blood sugar contributes to high fat (lipid) levels in the blood. Diabetes can also cause slow damage to tiny blood vessels that carry important nutrients to the heart muscle. When the heart does not get enough oxygen and food, it can cause the heart to become weak and stiff. This leads to a heart that does not contract efficiently.   Other diseases can contribute to HF. These include abnormal heart rhythms, thyroid problems, and low blood counts (anemia).   Unhealthy lifestyle habits:   Obesity.   Smoking.   Eating foods high in fat and cholesterol.   Eating or drinking beverages high in salt.   Drug or alcohol abuse.   Lack of exercise.  SYMPTOMS  HF symptoms may vary and can be hard to detect. Symptoms may include:  Shortness of breath with activity, such as climbing stairs.   Persistent cough.   Swelling of the feet, ankles, legs, or abdomen.   Unexplained weight gain.   Difficulty breathing when lying flat.   Waking from sleep because of the need to sit up and get more air.   Rapid heartbeat.   Fatigue and loss of energy.   Feeling lightheaded or close to fainting.  DIAGNOSIS  A diagnosis of HF is based on your history, symptoms, physical examination, and diagnostic tests. Diagnostic tests for HF may include:  EKG.  Chest X-ray.   Blood tests.   Exercise stress test.   Blood oxygen test (arterial blood gas).   Evaluation by a heart doctor (cardiologist).   Ultrasound evaluation of the heart (echocardiogram).   Heart artery test to look for blockages (angiogram).   Radioactive imaging to look at the heart (radionuclide test).  TREATMENT  Treatment is aimed at managing the symptoms of HF. Medicines, lifestyle changes, or surgical intervention may be necessary to treat HF.  Medicines to help treat HF may include:    Angiotensin-converting enzyme (ACE) inhibitors. These block the effects of a blood protein called angiotensin-converting enzyme. ACE inhibitors relax (dilate) the blood vessels and help lower blood pressure. This decreases the workload of the heart, slows the progression of HF, and improves symptoms.   Angiotensin receptor blockers (ARBs). These medications work similar to ACE inhibitors. ARBs may be an alternative for people who cannot tolerate an ACE inhibitor.   Aldosterone antagonists. This medication helps get rid of extra fluid from your body. This lowers the volume of blood the heart has to pump.   Water pills (diuretics). Diuretics cause the kidneys to remove salt and water from the blood. The extra fluid is removed by urination. By removing extra fluid from the body, diuretics help lower the workload of the heart and help prevent fluid buildup in the lungs so breathing is easier.   Beta blockers. These prevent the heart from beating too fast and improve heart muscle strength. Beta blockers help maintain a normal heart rate, control blood pressure, and improve HF symptoms.   Digitalis. This increases the force of the heartbeat and may be helpful to people with HF or heart rhythm problems.   Healthy lifestyle changes include:   Stopping smoking.   Eating a healthy diet. Avoid foods high in fat. Avoid foods fried in oil or made with fat. A dietician can help with healthy food choices.   Limiting how much salt you eat.   Limiting alcohol intake to no more than 1 drink per day for women and 2 drinks per day for men. Drinking more than that is harmful to your heart. If your heart has already been damaged by alcohol or you have severe HF, drinking alcohol should be stopped completely.   Exercising as directed by your caregiver.   Surgical treatment for HF may include:   Procedures to open blocked arteries, repair damaged heart valves, or remove damaged heart muscle tissue.   A  pacemaker to help heart muscle function and to control certain abnormal heart rhythms.   A defibrillator to possibly prevent sudden cardiac death.  HOME CARE INSTRUCTIONS   Activity level. Your caregiver can help you determine what type of exercise program may be helpful. It is important to maintain your strength. Pace your physical activity to avoid shortness of breath or chest pain. Rest for 1 hour before and after meals. A cardiac rehabilitation program may be helpful to some people with HF.   Diet. Eat a heart healthy diet. Food choices should be low in saturated fat and cholesterol. Talk to a dietician to learn about heart healthy foods.   Salt intake. When you have HF, you need to limit the amount of salt you eat. Eat less than 1500 milligrams (mg) of salt per day or as recommended by your caregiver.   Weight monitoring. Weigh yourself every day. You should weigh yourself in the morning after you urinate and before you eat breakfast. Wear the same amount of clothing each  time you weigh yourself. Record your weight daily. Bring your recorded weights to your clinic visits. Tell your caregiver right away if you have gained 3 lb/1.4 kg in 1 day, or 5 lb/2.3 kg in a week or whatever amount you were told to report.   Blood pressure monitoring. This should be done as directed by your caregiver. A home blood pressure cuff can be purchased at a drugstore. Record your blood pressure numbers and bring them to your clinic visits. Tell your caregiver if you become dizzy or lightheaded upon standing up.   Smoking. If you are currently a smoker, it is time to quit. Nicotine makes your heart work harder by causing your blood vessels to constrict. Do not use nicotine gum or patches before talking to your caregiver.   Follow up. Be sure to schedule a follow-up visit with your caregiver. Keep all your appointments.  SEEK MEDICAL CARE IF:   Your weight increases by 3 lb/1.4 kg in 1 day or 5 lb/2.3 kg in a  week.   You notice increasing shortness of breath that is unusual for you. This may happen during rest, sleep, or with activity.   You cough more than normal, especially with physical activity.   You notice more swelling in your hands, feet, ankles, or belly (abdomen).   You are unable to sleep because it is hard to breathe.   You cough up bloody mucus (sputum).   You begin to feel "jumping" or "fluttering" sensations (palpitations) in your chest.  SEEK IMMEDIATE MEDICAL CARE IF:   You have severe chest pain or pressure which may include symptoms such as:   Pain or pressure in the arms, neck, jaw, or back.   Feeling sweaty.   Feeling sick to your stomach (nauseous).   Feeling short of breath while at rest.   Having a fast or irregular heartbeat.   You experience stroke symptoms. These symptoms include:   Facial weakness or numbness.   Weakness or numbness in an arm, leg, or on one side of your body.   Blurred vision.   Difficulty talking or thinking.   Dizziness or fainting.   Severe headache.  THESE ARE MEDICAL EMERGENCIES. Do not wait to see if the symptoms go away. Call your local emergency services (911 in [place] DO NOT drive yourself to the hospital. IMPORTANT  Make a list of every medicine, vitamin, or herbal supplement you are taking. Keep the list with you at all times. Show it to your caregiver at every visit. Keep the list up-to-date.   Ask your caregiver or pharmacist to write an explanation of each medicine you are taking. This should include:   Why you are taking it.   The possible side effects.   The best time of day to take it.   Foods to take with it or what foods to avoid.   When to stop taking it.  MAKE SURE YOU:   Understand these instructions.   Will watch your condition.   Will get help right away if you are not doing well or get worse.  Document Released: 01/03/2005 Document Revised: 12/23/2010 Document Reviewed:  04/17/2009 [facility] Patient Information 2012 [place], [place].End Stage Kidney Disease End-stage kidney disease occurs when your kidneys no longer work well enough to support day-to-day life. It usually occurs when longstanding (chronic) kidney failure gets worse, to the point where kidney function is less than 10% of normal. At this point, the kidney function is so low that death will occur  from buildup of fluids and waste products in the body. The most common cause of kidney failure is diabetes. Kidney failure is very common. ESRD (End Stage Renal Disease) almost always follows chronic kidney failure or renal insufficiency. This condition may exist for 10 to 20 years or more before developing into ESRD. SYMPTOMS   Unintentional weight loss.   Fatigue, anemia.   Generalized itching (pruritus).   Easy bruising or bleeding.   Drowsiness, lethargy.   Muscle twitching or cramps.   Skin may appear yellow or brown.   General ill feeling.   Frequent hiccups.   No or decreased urine output.   Decreased alertness.   Coma.   Increased skin pigmentation.   Decreased sensation in the hands, feet, or other areas.  DIAGNOSIS  Your caregiver will be able to tell what is wrong by talking to you, doing an examination, and doing laboratory tests. The blood work and urinalysis will show that your kidneys are not working well enough. TREATMENT  Dialysis or kidney transplantation are the only treatments for ESRD. Your health, age and other factors determine which treatment is best. Other treatments for chronic renal failure should continue. Associated diseases that cause kidney failure should be controlled. Some of these are:  Hypertension.   Kidney stones.   Heart failure.   Obstructions of the urinary tract.   Urinary tract infections.   Glomerulonephritis.  PROGNOSIS  ESRD is fatal unless treated with dialysis or transplantation. Both of these treatments can have serious risks and  consequences. The outcome varies and is unique to each individual. RISKS AND COMPLICATIONS Complications of dialysis and kidney transplantation:  Hypertension (kidneys try to raise blood pressure so they can work better).   Platelet dysfunction (cells in blood which help with clotting are defective).   Gastrointestinal loss of blood, duodenal or peptic ulcers.   Hemorrhage (bleeding problems).   Anemia (not enough blood cells are produced).   Hepatitis B, hepatitis C, liver failure (exposure may occur during dialysis).   Infection from decreased operation of white blood cells and immune system.   Multiple cancers form, from long-term immunosuppressant use.   Peripheral neuropathy (damage to your nerves).   Seizures (convulsions).   Encephalopathy, nervous system damage, dementia (changes in your brain).   Weakening of the bones, fractures, joint disorders, joint replacements are common.   Permanent skin pigmentation changes.   Skin dryness, itching, scratching resulting in skin infection from hydration problems.   Changes in glucose metabolism.   Changes in electrolyte levels (salts in your blood).   Decreased libido, impotence (loss of interest in sex or ability to function well).   Miscarriage, menstrual irregularities, infertility.   Pericarditis (inflammation of the lining surrounding the heart).   Cardiac tamponade (fluid collection around the heart).   Heart failure in which your heart cannot pump well enough to keep up with the work.  PREVENTION  Treatment of the causes of longstanding kidney failure may delay or prevent progression to ESRD. For example, diabetes which is under strict control is less likely to cause renal failure than diabetes which is left untreated. Some causes of renal failure cannot be treated. Document Released: 03/26/2003 Document Revised: 12/23/2010 Document Reviewed: 01/03/2005 [facility] Patient Information 2012 [place], [place].

## 2011-07-15 NOTE — ED Notes (Signed)
SOB for one week. Pt states was unable to sleep last night due to SOB. Pt states has history of CHF. Saw pcp Wednesday and was told xray showed no fluid.

## 2011-07-15 NOTE — ED Notes (Signed)
Patient transported to X-ray 

## 2011-07-15 NOTE — ED Provider Notes (Signed)
History   This chart was scribed for Glynn Octave, MD by Shari Heritage. The patient was seen in room APA18/APA18. [name] care was started at 0940.     CSN: [id number]  Arrival date & time 07/15/11  0940   First MD Initiated Contact with Patient 07/15/11 [phone]      Chief Complaint  Patient presents with  . Shortness of Breath    (Consider location/radiation/quality/duration/timing/severity/associated sxs/prior treatment) The history is provided by the patient. No language interpreter was used.   [name] is a 66 y.o. male who presents to the Emergency Department complaining of intermittent, SOB onset 1 week ago that worsened last night. Patient says he last saw his PCP on Wednesday. He called Dr. Lodema Hong today explaining his symptoms and she recommended that he come to the ED for evaluation. Patient says he is not SOB right now, but thinks that he would have trouble upon exertion. Patient denies chest pain, cough, change in weight, fever, chills, abdominal pain, nausea, vomiting. Patient has a normal appetite. Patient says that his last bowel movement was Wednesday and that this is unusual for him. Patient with h/o CHF, diabetes, diabetic retinopathy, HTN, chronic kidney disease, sleep apnea, tobacco abuse, and hyperlipidemia.    PCP - Lodema Hong Cardiologist - [place]  Past Medical History  Diagnosis Date  . Congestive heart failure 10/2008    preserved LV systolic function   . Aortic insufficiency 09/2008    Normal left ventricular size and function : graded as mild by Doppler , but pulse pressure is widened  . 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 Hypertension   . 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 Erectile dysfunction   . Colonic polyp   . Low serum testosterone level   . Tobacco abuse   . Sleep apnea 2010    Initially unable to afford CPAP  . Benign prostatic hypertrophy    . Obesity, Class I, BMI 30-34.9   . Hyperlipidemia   . Congestive heart failure (CHF)     Past Surgical History  Procedure Date  . Retinal laser procedure     diabetic retinopathy  . Lymph node biopsy     surgical exploration of neck-not entirely clear that this represented a lymph node biopsy    Family History  Problem Relation Age of Onset  . Hypertension Mother   . Cancer Mother   . Cancer Father   . Diabetes Sister     History  Substance Use Topics  . Smoking status: Current Some Day Smoker -- 1.0 packs/day for 45 years    Types: Cigarettes  . Smokeless tobacco: Never Used  . Alcohol Use: 0.5 oz/week    1 drink(s) per week     occ      Review of Systems A complete 10 system review of systems was obtained and all systems are negative except as noted in the HPI and PMH.   Allergies  Ace inhibitors  Home Medications   Current Outpatient Rx  Name Route Sig Dispense Refill  . ASPIRIN 81 MG PO TBEC Oral Take 81 mg by mouth daily.      Marland Kitchen CALCITRIOL 0.25 MCG PO CAPS Oral Take 0.25 mcg by mouth daily.      Marland Kitchen CLONIDINE HCL 0.3 MG PO TABS Oral Take 1 tablet (0.3 mg total) by mouth 2 (two) times daily.  60 tablet 11    Dose increase effective 07/12/2011. Stop clonidine ...  . HYDRALAZINE HCL 25 MG PO TABS Oral Take 25 mg by mouth 2 (two) times daily.    Marland Kitchen LABETALOL HCL 300 MG PO TABS Oral Take 600 mg by mouth 2 (two) times daily.    . ONETOUCH ULTRA BLUE VI STRP      . POTASSIUM CHLORIDE CRYS ER 20 MEQ PO TBCR Oral Take 20 mEq by mouth daily.    Marland Kitchen SITAGLIPTIN PHOSPHATE 25 MG PO TABS Oral Take 25 mg by mouth daily.      Marland Kitchen TERAZOSIN HCL 10 MG PO CAPS Oral Take 2 capsules (20 mg total) by mouth at bedtime. 60 capsule 12  . TORSEMIDE 100 MG PO TABS Oral Take 100 mg by mouth 2 (two) times daily.       BP 170/54  Pulse 74  Temp 98.6 F (37 C) (Oral)  Resp 20  Ht 5' 10.5" (1.791 m)  Wt 240 lb (108.863 kg)  BMI 33.95 kg/m2  SpO2 97%  Physical Exam  Nursing note  and vitals reviewed. Constitutional: He is oriented to person, place, and time. He appears well-developed and well-nourished.  HENT:  Head: Normocephalic and atraumatic.  Eyes: Conjunctivae and EOM are normal. Pupils are equal, round, and reactive to light.  Neck: Normal range of motion. Neck supple.  Cardiovascular: Normal rate and regular rhythm.        No trace pedal edema. No assymetry.  Pulmonary/Chest: Effort normal. No respiratory distress.       Breath sounds diminished at bases.   Abdominal: Soft. Bowel sounds are normal.  Musculoskeletal: Normal range of motion.  Neurological: He is alert and oriented to person, place, and time.  Skin: Skin is warm and dry.  Psychiatric: He has a normal mood and affect.    ED Course  Procedures (including critical care time) DIAGNOSTIC STUDIES: Oxygen Saturation is 97% on room air, normal by my interpretation.    COORDINATION OF CARE: 10:03AM- Patient informed of current plan for treatment and evaluation and agrees with plan at this time. Will order labs and chest X-ray.  12:20PM- Upon recheck, patient says breathing has improved. He is experiencing no chest pain. Patient will be discharged.  Results for orders placed during the hospital encounter of 07/15/11  CBC WITH DIFFERENTIAL      Component Value Range   WBC 6.4  4.0 - 10.5 K/uL   RBC 3.32 (*) 4.22 - 5.81 MIL/uL   Hemoglobin 10.3 (*) 13.0 - 17.0 g/dL   HCT 40.9 (*) 81.1 - 91.4 %   MCV 91.9  78.0 - 100.0 fL   MCH 31.0  26.0 - 34.0 pg   MCHC 33.8  30.0 - 36.0 g/dL   RDW 78.2  95.6 - 21.3 %   Platelets 197  150 - 400 K/uL   Neutrophils Relative 59  43 - 77 %   Neutro Abs 3.8  1.7 - 7.7 K/uL   Lymphocytes Relative 25  12 - 46 %   Lymphs Abs 1.6  0.7 - 4.0 K/uL   Monocytes Relative 9  3 - 12 %   Monocytes Absolute 0.6  0.1 - 1.0 K/uL   Eosinophils Relative 6 (*) 0 - 5 %   Eosinophils Absolute 0.4  0.0 - 0.7 K/uL   Basophils Relative 1  0 - 1 %   Basophils Absolute 0.1  0.0 -  0.1 K/uL  COMPREHENSIVE METABOLIC PANEL  Component Value Range   Sodium 138  135 - 145 mEq/L   Potassium 3.5  3.5 - 5.1 mEq/L   Chloride 101  96 - 112 mEq/L   CO2 27  19 - 32 mEq/L   Glucose, Bld 164 (*) 70 - 99 mg/dL   BUN 30 (*) 6 - 23 mg/dL   Creatinine, Ser 1.47 (*) 0.50 - 1.35 mg/dL   Calcium 9.0  8.4 - 82.9 mg/dL   Total Protein 6.3  6.0 - 8.3 g/dL   Albumin 3.4 (*) 3.5 - 5.2 g/dL   AST 13  0 - 37 U/L   ALT 11  0 - 53 U/L   Alkaline Phosphatase 85  39 - 117 U/L   Total Bilirubin 0.3  0.3 - 1.2 mg/dL   GFR calc non Af Amer 18 (*) >90 mL/min   GFR calc Af Amer 21 (*) >90 mL/min  CARDIAC PANEL(CRET KIN+CKTOT+MB+TROPI)      Component Value Range   Total CK 358 (*) 7 - 232 U/L   CK, MB 4.3 (*) 0.3 - 4.0 ng/mL   Troponin I <0.30  <0.30 ng/mL   Relative Index 1.2  0.0 - 2.5  PRO B NATRIURETIC PEPTIDE      Component Value Range   Pro B Natriuretic peptide (BNP) 17380.0 (*) 0 - 125 pg/mL   Dg Chest 2 View  07/15/2011  *RADIOLOGY REPORT*  Clinical Data: Short of breath  CHEST - 2 VIEW  Comparison: 07/13/2011  Findings: COPD with pulmonary hyperinflation and prominent lung markings, unchanged.  Negative for heart failure.  Negative for pneumonia or effusion.  IMPRESSION: COPD.  No acute cardiopulmonary disease.  Original Report Authenticated By: Camelia Phenes, M.D.   Dg Chest 2 View  07/13/2011  *RADIOLOGY REPORT*  Clinical Data: 08/27/2010  CHEST - 2 VIEW  Comparison: 08/27/2010  Findings: Lungs are mildly hyperexpanded.  No edema or focal airspace consolidation.  Nodular density in the left upper lobe is unchanged.  Small nodule is seen in the right costophrenic sulcus. Cardiopericardial silhouette is at upper limits of normal for size. Imaged bony structures of the thorax are intact.  IMPRESSION: New tiny nodular density in the right costophrenic sulcus.  CT chest without contrast recommended to further evaluate.  Original Report Authenticated By: ERIC A. MANSELL, M.D.    No  diagnosis found.    MDM  Shortness of breath x 1 week, unable to sleep last night, feels better now.  No chest pain, cough, fever.  Saw Dr. Lodema Hong 2 days ago and had normal Xray.  Diastolic CHF on last echo. Has not seen cardiology in some time.  Lungs clear, trace edema.  CXR negative Creatinine elevated as previously. BNP elevated, but doesn't appear clinically decompensated.  No SOB or desaturation with ambulation.  Ambulatory in the ED without dyspnea.  Maintained oxygen saturation 100%. D/w Dr. Lodema Hong who sent patient in.  She agrees with lasix given in ED, caution given kidney disease.  She thought patient may need to be admitted but he has no dysnea or hypoxia now.  Also patient is not interested in being admitted.  Return precautions discussed.  Follow up in her office next week as well as Dr. Dietrich Pates.   Date: 07/15/2011  Rate: 63  Rhythm: normal sinus rhythm  QRS Axis: normal  Intervals: normal  ST/T Wave abnormalities: normal  Conduction Disutrbances:none  Narrative Interpretation:   Old EKG Reviewed: unchanged       I personally performed the services  described in this documentation, which was scribed in my presence.  The recorded information has been reviewed and considered.    Glynn Octave, MD 07/15/11 1504

## 2011-07-15 NOTE — ED Notes (Signed)
MD at bedside. 

## 2011-07-18 ENCOUNTER — Ambulatory Visit (INDEPENDENT_AMBULATORY_CARE_PROVIDER_SITE_OTHER): Payer: Medicare Other | Admitting: Family Medicine

## 2011-07-18 ENCOUNTER — Encounter: Payer: Self-pay | Admitting: Family Medicine

## 2011-07-18 VITALS — BP 160/60 | HR 74 | Resp 16 | Ht 71.0 in | Wt 242.8 lb

## 2011-07-18 DIAGNOSIS — R911 Solitary pulmonary nodule: Secondary | ICD-10-CM

## 2011-07-18 DIAGNOSIS — E785 Hyperlipidemia, unspecified: Secondary | ICD-10-CM

## 2011-07-18 DIAGNOSIS — E1139 Type 2 diabetes mellitus with other diabetic ophthalmic complication: Secondary | ICD-10-CM

## 2011-07-18 DIAGNOSIS — I5031 Acute diastolic (congestive) heart failure: Secondary | ICD-10-CM

## 2011-07-18 DIAGNOSIS — I509 Heart failure, unspecified: Secondary | ICD-10-CM

## 2011-07-18 DIAGNOSIS — F17200 Nicotine dependence, unspecified, uncomplicated: Secondary | ICD-10-CM

## 2011-07-18 DIAGNOSIS — H579 Unspecified disorder of eye and adnexa: Secondary | ICD-10-CM

## 2011-07-18 DIAGNOSIS — J449 Chronic obstructive pulmonary disease, unspecified: Secondary | ICD-10-CM

## 2011-07-18 DIAGNOSIS — I10 Essential (primary) hypertension: Secondary | ICD-10-CM

## 2011-07-18 MED ORDER — PREDNISONE (PAK) 5 MG PO TABS: 5.0000 mg | ORAL_TABLET | ORAL | Status: DC

## 2011-07-18 MED ORDER — METHYLPREDNISOLONE ACETATE 80 MG/ML IJ SUSP
80.0000 mg | Freq: Once | INTRAMUSCULAR | Status: AC
  Administered 2011-07-18: 80 mg via INTRAMUSCULAR

## 2011-07-18 NOTE — Progress Notes (Signed)
  Subjective:    Patient ID: [name], male    DOB: [birth year], 66 y.o.   MRN: [id number]  HPI Pt in for Ed follow up. States he continues to experience significant shortness of breath when lying down, with wheezing, feels symptoms are more related to COPD than heart failure. Rept CXR in the ED was negative for heart failure despite the fact that symptoms are more consistent with heart failure and his BNP was elevated when he was seen by me several days before. He denies fever, chills or sputum production. Still awaiting card re eval date Blood sugars are well controlled and he is on insulin only    Review of Systems See HPI Denies recent fever or chills. Denies sinus pressure, nasal congestion, ear pain or sore throat.  Denies chest pains, palpitations and leg swelling Denies abdominal pain, nausea, vomiting,diarrhea or constipation.   Denies dysuria, frequency, hesitancy or incontinence. Denies joint pain, swelling and limitation in mobility. Denies headaches, seizures, numbness, or tingling. Denies depression, anxiety or insomnia. Denies skin break down or rash.        Objective:   Physical Exam Patient alert and oriented and in no cardiopulmonary distress.  HEENT: No facial asymmetry, EOMI, no sinus tenderness,  oropharynx pink and moist.  Neck supple no adenopathy.  Chest: decreased air entry bilaterlally, no  crackles few  wheezes CVS: S1, S2 no murmurs, no S3.  ABD: Soft non tender. Bowel sounds normal.  Ext: No edema  MS: Adequate ROM spine, shoulders, hips and knees.  Skin: Intact, no ulcerations or rash noted.  Psych: Good eye contact, normal affect. Memory intact mildly  anxiousnot r depressed appearing.  CNS: CN 2-12 intact, power, tone and sensation normal throughout.        Assessment & Plan:

## 2011-07-18 NOTE — Patient Instructions (Addendum)
F/u in 6 to 8 weeks You will be treated for an acute flare of COPD.  It is vital that you see cardiology, as soon as possible, also you are referred for a chest CT scan to eval lung nodule  Neb treatment and depo medrol 80mg  iM in the office and prednisone dose pack also sent in.  Use the nebulizing machine every night for the next 5 to 7 days till your symptoms improve  Please DO NOT resume smoking

## 2011-07-19 ENCOUNTER — Emergency Department (HOSPITAL_COMMUNITY): Payer: Medicare Other

## 2011-07-19 ENCOUNTER — Inpatient Hospital Stay (HOSPITAL_COMMUNITY): Payer: Medicare Other

## 2011-07-19 ENCOUNTER — Telehealth: Payer: Self-pay | Admitting: Family Medicine

## 2011-07-19 ENCOUNTER — Encounter (HOSPITAL_COMMUNITY): Payer: Self-pay | Admitting: *Deleted

## 2011-07-19 ENCOUNTER — Inpatient Hospital Stay (HOSPITAL_COMMUNITY)
Admission: EM | Admit: 2011-07-19 | Discharge: 2011-07-21 | DRG: 292 | Disposition: A | Discharge status: Home Health | Payer: Medicare Other | Attending: Internal Medicine | Admitting: Internal Medicine

## 2011-07-19 DIAGNOSIS — E1139 Type 2 diabetes mellitus with other diabetic ophthalmic complication: Secondary | ICD-10-CM | POA: Diagnosis present

## 2011-07-19 DIAGNOSIS — I10 Essential (primary) hypertension: Secondary | ICD-10-CM | POA: Diagnosis present

## 2011-07-19 DIAGNOSIS — N184 Chronic kidney disease, stage 4 (severe): Secondary | ICD-10-CM

## 2011-07-19 DIAGNOSIS — Z6834 Body mass index (BMI) 34.0-34.9, adult: Secondary | ICD-10-CM

## 2011-07-19 DIAGNOSIS — J449 Chronic obstructive pulmonary disease, unspecified: Secondary | ICD-10-CM

## 2011-07-19 DIAGNOSIS — E669 Obesity, unspecified: Secondary | ICD-10-CM | POA: Diagnosis present

## 2011-07-19 DIAGNOSIS — I5031 Acute diastolic (congestive) heart failure: Secondary | ICD-10-CM

## 2011-07-19 DIAGNOSIS — I129 Hypertensive chronic kidney disease with stage 1 through stage 4 chronic kidney disease, or unspecified chronic kidney disease: Secondary | ICD-10-CM | POA: Diagnosis present

## 2011-07-19 DIAGNOSIS — R0600 Dyspnea, unspecified: Secondary | ICD-10-CM

## 2011-07-19 DIAGNOSIS — Z79899 Other long term (current) drug therapy: Secondary | ICD-10-CM

## 2011-07-19 DIAGNOSIS — Z809 Family history of malignant neoplasm, unspecified: Secondary | ICD-10-CM

## 2011-07-19 DIAGNOSIS — I5022 Chronic systolic (congestive) heart failure: Secondary | ICD-10-CM | POA: Diagnosis present

## 2011-07-19 DIAGNOSIS — N289 Disorder of kidney and ureter, unspecified: Secondary | ICD-10-CM

## 2011-07-19 DIAGNOSIS — I509 Heart failure, unspecified: Secondary | ICD-10-CM

## 2011-07-19 DIAGNOSIS — J4489 Other specified chronic obstructive pulmonary disease: Secondary | ICD-10-CM | POA: Diagnosis present

## 2011-07-19 DIAGNOSIS — Z7982 Long term (current) use of aspirin: Secondary | ICD-10-CM

## 2011-07-19 DIAGNOSIS — G4730 Sleep apnea, unspecified: Secondary | ICD-10-CM | POA: Diagnosis present

## 2011-07-19 DIAGNOSIS — I5021 Acute systolic (congestive) heart failure: Principal | ICD-10-CM

## 2011-07-19 DIAGNOSIS — Z794 Long term (current) use of insulin: Secondary | ICD-10-CM

## 2011-07-19 DIAGNOSIS — I359 Nonrheumatic aortic valve disorder, unspecified: Secondary | ICD-10-CM

## 2011-07-19 DIAGNOSIS — N058 Unspecified nephritic syndrome with other morphologic changes: Secondary | ICD-10-CM | POA: Diagnosis present

## 2011-07-19 DIAGNOSIS — E11319 Type 2 diabetes mellitus with unspecified diabetic retinopathy without macular edema: Secondary | ICD-10-CM | POA: Diagnosis present

## 2011-07-19 DIAGNOSIS — E785 Hyperlipidemia, unspecified: Secondary | ICD-10-CM | POA: Diagnosis present

## 2011-07-19 DIAGNOSIS — F17200 Nicotine dependence, unspecified, uncomplicated: Secondary | ICD-10-CM | POA: Diagnosis present

## 2011-07-19 DIAGNOSIS — E1129 Type 2 diabetes mellitus with other diabetic kidney complication: Secondary | ICD-10-CM | POA: Diagnosis present

## 2011-07-19 DIAGNOSIS — Z833 Family history of diabetes mellitus: Secondary | ICD-10-CM

## 2011-07-19 LAB — TROPONIN I: Troponin I: <0.3 ng/mL (ref <0.30)

## 2011-07-19 LAB — CBC WITH DIFFERENTIAL/PLATELET
Basophils Absolute: 0 10*3/uL (ref 0.0–0.1)
Basophils Relative: 1 % (ref 0–1)
HCT: 32.8 % — ABNORMAL LOW (ref 39.0–52.0)
Lymphocytes Relative: 18 % (ref 12–46)
MCHC: 33.8 g/dL (ref 30.0–36.0)
Neutro Abs: 5.3 10*3/uL (ref 1.7–7.7)
Neutrophils Relative %: 70 % (ref 43–77)
RDW: 14.7 % (ref 11.5–15.5)
WBC: 7.7 10*3/uL (ref 4.0–10.5)

## 2011-07-19 LAB — CARDIAC PANEL(CRET KIN+CKTOT+MB+TROPI)
CK, MB: 4.6 ng/mL — ABNORMAL HIGH (ref 0.3–4.0)
Troponin I: <0.3 ng/mL (ref <0.30)

## 2011-07-19 LAB — BASIC METABOLIC PANEL
BUN: 31 mg/dL — ABNORMAL HIGH (ref 6–23)
Chloride: 102 mEq/L (ref 96–112)
Creatinine, Ser: 3.25 mg/dL — ABNORMAL HIGH (ref 0.50–1.35)
GFR calc Af Amer: 21 mL/min — ABNORMAL LOW (ref >90)
GFR calc non Af Amer: 18 mL/min — ABNORMAL LOW (ref >90)
Potassium: 3.6 mEq/L (ref 3.5–5.1)

## 2011-07-19 LAB — PRO B NATRIURETIC PEPTIDE: Pro B Natriuretic peptide (BNP): 23725 pg/mL — ABNORMAL HIGH (ref 0–125)

## 2011-07-19 MED ORDER — IPRATROPIUM BROMIDE 0.02 % IN SOLN
0.5000 mg | Freq: Once | RESPIRATORY_TRACT | Status: AC
  Administered 2011-07-19: 0.5 mg via RESPIRATORY_TRACT
  Filled 2011-07-19: qty 2.5

## 2011-07-19 MED ORDER — ASPIRIN EC 81 MG PO TBEC
81.0000 mg | DELAYED_RELEASE_TABLET | Freq: Every day | ORAL | Status: DC
  Administered 2011-07-19 – 2011-07-21 (×3): 81 mg via ORAL
  Filled 2011-07-19 (×3): qty 1

## 2011-07-19 MED ORDER — ALBUTEROL SULFATE (5 MG/ML) 0.5% IN NEBU
2.5000 mg | INHALATION_SOLUTION | RESPIRATORY_TRACT | Status: DC | PRN
  Administered 2011-07-19: 2.5 mg via RESPIRATORY_TRACT
  Filled 2011-07-19: qty 0.5

## 2011-07-19 MED ORDER — FUROSEMIDE 10 MG/ML IJ SOLN
40.0000 mg | Freq: Once | INTRAMUSCULAR | Status: AC
  Administered 2011-07-19: 40 mg via INTRAVENOUS

## 2011-07-19 MED ORDER — ONDANSETRON HCL 4 MG/2ML IJ SOLN: 4.0000 mg | Freq: Four times a day (QID) | INTRAMUSCULAR | Status: DC | PRN

## 2011-07-19 MED ORDER — ASPIRIN EC 81 MG PO TBEC: 81.0000 mg | DELAYED_RELEASE_TABLET | Freq: Every day | ORAL | Status: DC

## 2011-07-19 MED ORDER — LABETALOL HCL 200 MG PO TABS
600.0000 mg | ORAL_TABLET | Freq: Two times a day (BID) | ORAL | Status: DC
  Administered 2011-07-19 – 2011-07-21 (×5): 600 mg via ORAL
  Filled 2011-07-19 (×5): qty 3

## 2011-07-19 MED ORDER — ALBUTEROL SULFATE (5 MG/ML) 0.5% IN NEBU: 2.5000 mg | INHALATION_SOLUTION | RESPIRATORY_TRACT | Status: DC

## 2011-07-19 MED ORDER — ALBUTEROL SULFATE (5 MG/ML) 0.5% IN NEBU
2.5000 mg | INHALATION_SOLUTION | Freq: Four times a day (QID) | RESPIRATORY_TRACT | Status: DC
  Administered 2011-07-19 – 2011-07-21 (×6): 2.5 mg via RESPIRATORY_TRACT
  Filled 2011-07-19 (×6): qty 0.5

## 2011-07-19 MED ORDER — ALBUTEROL SULFATE (5 MG/ML) 0.5% IN NEBU: 2.5000 mg | INHALATION_SOLUTION | RESPIRATORY_TRACT | Status: DC | PRN

## 2011-07-19 MED ORDER — SODIUM CHLORIDE 0.9 % IJ SOLN
3.0000 mL | INTRAMUSCULAR | Status: DC | PRN
  Filled 2011-07-19 (×2): qty 3

## 2011-07-19 MED ORDER — ACETAMINOPHEN 325 MG PO TABS: 650.0000 mg | ORAL_TABLET | ORAL | Status: DC | PRN

## 2011-07-19 MED ORDER — ALBUTEROL SULFATE (5 MG/ML) 0.5% IN NEBU
5.0000 mg | INHALATION_SOLUTION | Freq: Once | RESPIRATORY_TRACT | Status: AC
  Administered 2011-07-19: 5 mg via RESPIRATORY_TRACT
  Filled 2011-07-19: qty 1

## 2011-07-19 MED ORDER — FUROSEMIDE 10 MG/ML IJ SOLN: 40.0000 mg | Freq: Once | INTRAMUSCULAR | Status: DC

## 2011-07-19 MED ORDER — SODIUM CHLORIDE 0.9 % IJ SOLN
3.0000 mL | Freq: Two times a day (BID) | INTRAMUSCULAR | Status: DC
  Administered 2011-07-19 – 2011-07-20 (×2): 3 mL via INTRAVENOUS
  Filled 2011-07-19 (×2): qty 3

## 2011-07-19 MED ORDER — ENOXAPARIN SODIUM 30 MG/0.3ML ~~LOC~~ SOLN
30.0000 mg | SUBCUTANEOUS | Status: DC
  Administered 2011-07-19 – 2011-07-20 (×2): 30 mg via SUBCUTANEOUS
  Filled 2011-07-19 (×2): qty 0.3

## 2011-07-19 MED ORDER — FUROSEMIDE 10 MG/ML IJ SOLN
INTRAMUSCULAR | Status: AC
  Administered 2011-07-19: 40 mg via INTRAVENOUS
  Filled 2011-07-19: qty 4

## 2011-07-19 MED ORDER — ISOSORB DINITRATE-HYDRALAZINE 20-37.5 MG PO TABS
1.0000 | ORAL_TABLET | Freq: Two times a day (BID) | ORAL | Status: DC
  Administered 2011-07-19 – 2011-07-21 (×5): 1 via ORAL
  Filled 2011-07-19 (×7): qty 1

## 2011-07-19 MED ORDER — SODIUM CHLORIDE 0.9 % IV SOLN: 250.0000 mL | INTRAVENOUS | Status: DC | PRN

## 2011-07-19 MED ORDER — CLONIDINE HCL 0.2 MG PO TABS
0.3000 mg | ORAL_TABLET | Freq: Two times a day (BID) | ORAL | Status: DC
  Administered 2011-07-19 – 2011-07-21 (×5): 0.3 mg via ORAL
  Filled 2011-07-19 (×6): qty 1

## 2011-07-19 MED ORDER — INSULIN GLARGINE 100 UNIT/ML ~~LOC~~ SOLN
30.0000 [IU] | Freq: Every day | SUBCUTANEOUS | Status: DC
  Administered 2011-07-19 – 2011-07-20 (×2): 30 [IU] via SUBCUTANEOUS

## 2011-07-19 MED ORDER — SODIUM CHLORIDE 0.9 % IV SOLN
INTRAVENOUS | Status: AC
  Administered 2011-07-19: 11:00:00 via INTRAVENOUS

## 2011-07-19 MED ORDER — POTASSIUM CHLORIDE CRYS ER 20 MEQ PO TBCR
20.0000 meq | EXTENDED_RELEASE_TABLET | Freq: Every day | ORAL | Status: DC
  Administered 2011-07-19 – 2011-07-21 (×3): 20 meq via ORAL
  Filled 2011-07-19 (×3): qty 1

## 2011-07-19 MED ORDER — TERAZOSIN HCL 5 MG PO CAPS
20.0000 mg | ORAL_CAPSULE | Freq: Every day | ORAL | Status: DC
  Administered 2011-07-19 – 2011-07-20 (×2): 20 mg via ORAL
  Filled 2011-07-19 (×2): qty 4

## 2011-07-19 MED ORDER — IPRATROPIUM BROMIDE 0.02 % IN SOLN
0.5000 mg | Freq: Once | RESPIRATORY_TRACT | Status: AC
  Administered 2011-07-19: 0.5 mg via RESPIRATORY_TRACT

## 2011-07-19 MED ORDER — FUROSEMIDE 10 MG/ML IJ SOLN
80.0000 mg | Freq: Two times a day (BID) | INTRAMUSCULAR | Status: DC
  Administered 2011-07-19 – 2011-07-20 (×3): 80 mg via INTRAVENOUS
  Filled 2011-07-19 (×3): qty 8

## 2011-07-19 MED ORDER — CALCITRIOL 0.25 MCG PO CAPS
0.2500 ug | ORAL_CAPSULE | Freq: Every day | ORAL | Status: DC
  Administered 2011-07-19 – 2011-07-21 (×3): 0.25 ug via ORAL
  Filled 2011-07-19 (×3): qty 1

## 2011-07-19 MED ORDER — ALBUTEROL SULFATE (2.5 MG/3ML) 0.083% IN NEBU
2.5000 mg | INHALATION_SOLUTION | Freq: Once | RESPIRATORY_TRACT | Status: AC
  Administered 2011-07-19: 2.5 mg via RESPIRATORY_TRACT

## 2011-07-19 NOTE — ED Provider Notes (Signed)
History     CSN: [id number]  Arrival date & time 07/19/11  0544   First MD Initiated Contact with Patient 07/19/11 [id number]      Chief Complaint  Patient presents with  . Shortness of Breath    (Consider location/radiation/quality/duration/timing/severity/associated sxs/prior treatment) HPI This is a 66 year old black male with a history congestive heart failure and COPD. He has been having shortness of breath worse than baseline for about the last week. He was seen in the ED 4 days ago and discharged home after he declined admission. He was seen by his primary care physician yesterday who diagnosed COPD exacerbation and started him on prednisone which was to begin this morning. He has also been using his nebulizer machine at home without adequate relief. His shortness of breath is mild when sitting upright but moderate to severe when supine. There's been no associated fever, chills, chest pain, nausea, vomiting or diarrhea. He has had a nonproductive cough. He has trace edema of his lower legs which is not unusual for him. He states he is here this morning because his shortness of breath increased.  Past Medical History  Diagnosis Date  . Congestive heart failure 10/2008    preserved LV systolic function   . Aortic insufficiency 09/2008    Normal left ventricular size and function : graded as mild by Doppler , but pulse pressure is widened  . 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 Hypertension   . 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 Erectile dysfunction   . Colonic polyp   . Low serum testosterone level   . Tobacco abuse   . Sleep apnea 2010    Initially unable to afford CPAP  . Benign prostatic hypertrophy   . Obesity, Class I, BMI 30-34.9   . Hyperlipidemia   . Congestive heart failure (CHF)     Past Surgical History  Procedure Date  . Retinal laser procedure     diabetic  retinopathy  . Lymph node biopsy     surgical exploration of neck-not entirely clear that this represented a lymph node biopsy    Family History  Problem Relation Age of Onset  . Hypertension Mother   . Cancer Mother   . Cancer Father   . Diabetes Sister     History  Substance Use Topics  . Smoking status: Smoker, Current Status Unknown -- 1.0 packs/day for 45 years    Types: Cigarettes  . Smokeless tobacco: Never Used  . Alcohol Use: 0.5 oz/week    1 drink(s) per week     occ      Review of Systems  All other systems reviewed and are negative.    Allergies  Ace inhibitors  Home Medications   Current Outpatient Rx  Name Route Sig Dispense Refill  . ASPIRIN 81 MG PO TBEC Oral Take 81 mg by mouth daily.      Marland Kitchen CALCITRIOL 0.25 MCG PO CAPS Oral Take 0.25 mcg by mouth daily.      Marland Kitchen CLONIDINE HCL 0.3 MG PO TABS Oral Take 0.3 mg by mouth 2 (two) times daily.    Marland Kitchen HYDRALAZINE HCL 25 MG PO TABS Oral Take 25 mg by mouth 2 (two) times daily.    . INSULIN GLARGINE 100 UNIT/ML [place] SOLN Subcutaneous Inject 30 Units into the skin at bedtime.    Marland Kitchen LABETALOL HCL  300 MG PO TABS Oral Take 600 mg by mouth 2 (two) times daily.    Marland Kitchen POTASSIUM CHLORIDE CRYS ER 20 MEQ PO TBCR Oral Take 20 mEq by mouth daily.    Marland Kitchen TERAZOSIN HCL 10 MG PO CAPS Oral Take 20 mg by mouth at bedtime.    . TORSEMIDE 100 MG PO TABS Oral Take 100 mg by mouth 2 (two) times daily.     Marland Kitchen PREDNISONE (PAK) 5 MG PO TABS Oral Take 1 tablet (5 mg total) by mouth as directed. Use as directed 21 tablet 0    BP 190/57  Pulse 76  Temp 98.1 F (36.7 C) (Oral)  Resp 22  Ht 5\' 10"  (1.778 m)  Wt 236 lb (107.049 kg)  BMI 33.86 kg/m2  SpO2 98%  Physical Exam General: Well-developed, well-nourished male in no acute distress; appearance consistent with age of record HENT: normocephalic, atraumatic Eyes: pupils equal round and reactive to light; extraocular muscles intact; arcus senilis bilaterally Neck: supple Heart:  regular rate and rhythm; distant sound Lungs: Expiratory wheezes Abdomen: soft; nondistended Extremities: No deformity; full range of motion; trace edema of lower legs Neurologic: Awake, alert and oriented; motor function intact in all extremities and symmetric; no facial droop Skin: Warm and dry Psychiatric: Normal mood and affect    ED Course  Procedures (including critical care time)     MDM   Nursing notes and vitals signs, including pulse oximetry, reviewed.  Summary of this visit's results, reviewed by myself:  Labs:  Results for orders placed during the hospital encounter of 07/19/11  BASIC METABOLIC PANEL      Component Value Range   Sodium 140  135 - 145 mEq/L   Potassium 3.6  3.5 - 5.1 mEq/L   Chloride 102  96 - 112 mEq/L   CO2 26  19 - 32 mEq/L   Glucose, Bld 148 (*) 70 - 99 mg/dL   BUN 31 (*) 6 - 23 mg/dL   Creatinine, Ser 1.61 (*) 0.50 - 1.35 mg/dL   Calcium 9.6  8.4 - 09.6 mg/dL   GFR calc non Af Amer 18 (*) >90 mL/min   GFR calc Af Amer 21 (*) >90 mL/min  CBC WITH DIFFERENTIAL      Component Value Range   WBC 7.7  4.0 - 10.5 K/uL   RBC 3.56 (*) 4.22 - 5.81 MIL/uL   Hemoglobin 11.1 (*) 13.0 - 17.0 g/dL   HCT 04.5 (*) 40.9 - 81.1 %   MCV 92.1  78.0 - 100.0 fL   MCH 31.2  26.0 - 34.0 pg   MCHC 33.8  30.0 - 36.0 g/dL   RDW 91.4  78.2 - 95.6 %   Platelets 214  150 - 400 K/uL   Neutrophils Relative 70  43 - 77 %   Neutro Abs 5.3  1.7 - 7.7 K/uL   Lymphocytes Relative 18  12 - 46 %   Lymphs Abs 1.4  0.7 - 4.0 K/uL   Monocytes Relative 7  3 - 12 %   Monocytes Absolute 0.6  0.1 - 1.0 K/uL   Eosinophils Relative 4  0 - 5 %   Eosinophils Absolute 0.3  0.0 - 0.7 K/uL   Basophils Relative 1  0 - 1 %   Basophils Absolute 0.0  0.0 - 0.1 K/uL  TROPONIN I      Component Value Range   Troponin I <0.30  <0.30 ng/mL  PRO B NATRIURETIC PEPTIDE  Component Value Range   Pro B Natriuretic peptide (BNP) 23725.0 (*) 0 - 125 pg/mL    Imaging Studies: Dg Chest  2 View  07/19/2011  *RADIOLOGY REPORT*  Clinical Data: Shortness of breath  CHEST - 2 VIEW  Comparison: 07/15/2011  Findings: Hyperinflation with flattened hemidiaphragms and coarse interstitial markings.  Cardiomegaly with central vascular congestion. Mild aortic prominence / tortuosity similar to prior. Aortic atherosclerotic calcification.  Superimposed interstitial prominence.  No pleural effusion or pneumothorax.  Multilevel degenerative changes and osteopenia.  No acute osseous finding.  IMPRESSION: Cardiomegaly with central vascular congestion.  Mild interstitial edema is suggested.  These findings are superimposed on coarse interstitial markings and hyperinflation, in keeping with COPD.  Original Report Authenticated By: Waneta Martins, M.D.      EKG Interpretation:  Date & Time: 07/19/2011 6:04 AM  Rate: 88  Rhythm: normal sinus rhythm  QRS Axis: normal  Intervals: normal  ST/T Wave abnormalities: normal  Conduction Disutrbances:none  Narrative Interpretation: LVH  Old EKG Reviewed: Rate is faster  6:58 AM Patient received no significant improvement after albuterol and Atrovent neb treatment. He was given 40 mg of Lasix IV. He is amenable to being admitted this time stating that when he was here last week he had some personal issues he had to deal with.          Hanley Seamen, MD 07/19/11 [phone]

## 2011-07-19 NOTE — H&P (Signed)
PCP:   Syliva Overman, MD   Chief Complaint:  Shortness of breath  HPI: This is a 66 year old gentleman with history of aortic valve disease, chronic kidney disease stage IV, hypertension and diabetes. Patient presents to the emergency room today with complaints of shortness of breath. He reports feeling short of breath on and off for the past few weeks but worse so over the past week. Patient reports difficulty breathing while lying down, worse so at night. Denies any chest pain. He has had a chronic cough it is nonproductive, he has noticed increased wheezing recently as well. He's been compliant with his medication as well as his diet. He is recording his weights and has not reported a significant increase, although he does report feeling that his abdomen is swollen.. Denies any fever. He went to see his primary care physician recently and was prescribed a course of steroids for possible COPD. The emergency room he was evaluated and there was noted to be some vascular congestion on his chest x-ray and it was noted that his BN peptide was elevated above baseline. Last available echocardiogram is from August of 2012 showed preserved ejection fraction. He has been referred for admission.  Allergies:   Allergies  Allergen Reactions  . Ace Inhibitors     REACTION: cough      Past Medical History  Diagnosis Date  . Congestive heart failure 10/2008    preserved LV systolic function   . Aortic insufficiency 09/2008    Normal left ventricular size and function : graded as mild by Doppler , but pulse pressure is widened  . 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 Hypertension   . 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 Erectile dysfunction   . Colonic polyp   . Low serum testosterone level   . Tobacco abuse   . Sleep apnea 2010    Initially unable to afford CPAP  . Benign prostatic hypertrophy   .  Obesity, Class I, BMI 30-34.9   . Hyperlipidemia   . Congestive heart failure (CHF)     Past Surgical History  Procedure Date  . Retinal laser procedure     diabetic retinopathy  . Lymph node biopsy     surgical exploration of neck-not entirely clear that this represented a lymph node biopsy    Prior to Admission medications   Medication Sig Start Date End Date Taking? Authorizing Provider  aspirin (ASPIRIN LOW DOSE) 81 MG EC tablet Take 81 mg by mouth daily.     Yes Historical Provider, MD  calcitRIOL (ROCALTROL) 0.25 MCG capsule Take 0.25 mcg by mouth daily.     Yes Historical Provider, MD  cloNIDine (CATAPRES) 0.3 MG tablet Take 0.3 mg by mouth 2 (two) times daily. 07/12/11 07/11/12 Yes Kerri Perches, MD  hydrALAZINE (APRESOLINE) 25 MG tablet Take 25 mg by mouth 2 (two) times daily.   Yes Historical Provider, MD  insulin glargine (LANTUS) 100 UNIT/ML injection Inject 30 Units into the skin at bedtime.   Yes Historical Provider, MD  labetalol (NORMODYNE) 300 MG tablet Take 600 mg by mouth 2 (two) times daily.   Yes Historical Provider, MD  potassium chloride SA (K-DUR,KLOR-CON) 20 MEQ tablet Take 20 mEq by mouth daily. 01/21/11  Yes Kathlen Brunswick, MD  terazosin (HYTRIN) 10 MG capsule Take 20 mg by mouth at bedtime. 11/04/10 11/04/11  Yes Kathlen Brunswick, MD  torsemide (DEMADEX) 100 MG tablet Take 100 mg by mouth 2 (two) times daily.    Yes Historical Provider, MD  predniSONE (STERAPRED UNI-PAK) 5 MG TABS Take 1 tablet (5 mg total) by mouth as directed. Use as directed 07/18/11   Kerri Perches, MD    Social History:  reports that he has been smoking Cigarettes.  He has a 45 pack-year smoking history. He has never used smokeless tobacco. He reports that he drinks about .5 ounces of alcohol per week. He reports that he does not use illicit drugs.  Family History  Problem Relation Age of Onset  . Hypertension Mother   . Cancer Mother   . Cancer Father   . Diabetes Sister      Review of Systems: Positives in bold Constitutional: Denies fever, chills, diaphoresis, appetite change and fatigue.  HEENT: Denies photophobia, eye pain, redness, hearing loss, ear pain, congestion, sore throat, rhinorrhea, sneezing, mouth sores, trouble swallowing, neck pain, neck stiffness and tinnitus.   Respiratory: Denies SOB, DOE, cough, chest tightness,  and wheezing.   Cardiovascular: Denies chest pain, palpitations and leg swelling.  Gastrointestinal: Denies nausea, vomiting, abdominal pain, diarrhea, constipation, blood in stool and abdominal distention.  Genitourinary: Denies dysuria, urgency, frequency, hematuria, flank pain and difficulty urinating.  Musculoskeletal: Denies myalgias, back pain, joint swelling, arthralgias and gait problem.  Skin: Denies pallor, rash and wound.  Neurological: Denies dizziness, seizures, syncope, weakness, light-headedness, numbness and headaches.  Hematological: Denies adenopathy. Easy bruising, personal or family bleeding history  Psychiatric/Behavioral: Denies suicidal ideation, mood changes, confusion, nervousness, sleep disturbance and agitation   Physical Exam: Blood pressure 201/72, pulse 79, temperature 98.1 F (36.7 C), temperature source Oral, resp. rate 20, height 5\' 10"  (1.778 m), weight 108.4 kg (238 lb 15.7 oz), SpO2 90.00%. General: Patient is sitting up in bed, does not appear to be in any acute distress, alert 9 to x3 HEENT: Normocephalic, atraumatic, pupils are equal round react to light Neck: Supple Chest mild bilateral expiratory wheezes, worse at bases Cardiac: S1, S2, regular rate and rhythm Abdomen: Soft, nontender, nondistended, process are active Extremities 1+ edema bilaterally Neurologic: Grossly intact, nonfocal Skin: Intact, no visible rashes  Labs on Admission:  Results for orders placed during the hospital encounter of 07/19/11 (from the past 48 hour(s))  BASIC METABOLIC PANEL     Status: Abnormal    Collection Time   07/19/11  6:01 AM      Component Value Range Comment   Sodium 140  135 - 145 mEq/L    Potassium 3.6  3.5 - 5.1 mEq/L    Chloride 102  96 - 112 mEq/L    CO2 26  19 - 32 mEq/L    Glucose, Bld 148 (*) 70 - 99 mg/dL    BUN 31 (*) 6 - 23 mg/dL    Creatinine, Ser 1.61 (*) 0.50 - 1.35 mg/dL    Calcium 9.6  8.4 - 09.6 mg/dL    GFR calc non Af Amer 18 (*) >90 mL/min    GFR calc Af Amer 21 (*) >90 mL/min   CBC WITH DIFFERENTIAL     Status: Abnormal   Collection Time   07/19/11  6:01 AM      Component Value Range Comment   WBC 7.7  4.0 - 10.5 K/uL    RBC 3.56 (*) 4.22 - 5.81 MIL/uL    Hemoglobin 11.1 (*) 13.0 - 17.0 g/dL    HCT 04.5 (*) 40.9 -  52.0 %    MCV 92.1  78.0 - 100.0 fL    MCH 31.2  26.0 - 34.0 pg    MCHC 33.8  30.0 - 36.0 g/dL    RDW 16.1  09.6 - 04.5 %    Platelets 214  150 - 400 K/uL    Neutrophils Relative 70  43 - 77 %    Neutro Abs 5.3  1.7 - 7.7 K/uL    Lymphocytes Relative 18  12 - 46 %    Lymphs Abs 1.4  0.7 - 4.0 K/uL    Monocytes Relative 7  3 - 12 %    Monocytes Absolute 0.6  0.1 - 1.0 K/uL    Eosinophils Relative 4  0 - 5 %    Eosinophils Absolute 0.3  0.0 - 0.7 K/uL    Basophils Relative 1  0 - 1 %    Basophils Absolute 0.0  0.0 - 0.1 K/uL   TROPONIN I     Status: Normal   Collection Time   07/19/11  6:01 AM      Component Value Range Comment   Troponin I <0.30  <0.30 ng/mL   PRO B NATRIURETIC PEPTIDE     Status: Abnormal   Collection Time   07/19/11  6:01 AM      Component Value Range Comment   Pro B Natriuretic peptide (BNP) 23725.0 (*) 0 - 125 pg/mL     Radiological Exams on Admission: Dg Chest 2 View  07/19/2011  *RADIOLOGY REPORT*  Clinical Data: Shortness of breath  CHEST - 2 VIEW  Comparison: 07/15/2011  Findings: Hyperinflation with flattened hemidiaphragms and coarse interstitial markings.  Cardiomegaly with central vascular congestion. Mild aortic prominence / tortuosity similar to prior. Aortic atherosclerotic calcification.   Superimposed interstitial prominence.  No pleural effusion or pneumothorax.  Multilevel degenerative changes and osteopenia.  No acute osseous finding.  IMPRESSION: Cardiomegaly with central vascular congestion.  Mild interstitial edema is suggested.  These findings are superimposed on coarse interstitial markings and hyperinflation, in keeping with COPD.  Original Report Authenticated By: Waneta Martins, M.D.    Assessment/Plan Principal Problem:  *Acute diastolic congestive heart failure Active Problems:  DIABETES MELLITUS, TYPE II, WITH RETINOPATHY  HYPERTENSION  Chronic kidney disease, stage 4, severely decreased GFR  COPD (chronic obstructive pulmonary disease)  Dyspnea  Plan:  #1. Acute diastolic congestive heart failure. Patient will place her on IV diuresis. He'll receive daily weights and strict I.'s and O.'s. We will check a 2-D echocardiogram to reassess LV function. He is already on a beta blocker. He is not a candidate for an ACE inhibitor due to allergies as well as chronic kidney disease. He is already on hydralazine, this will be changed to Bidil. We will watch renal function closely in the setting of IV diuresis. Will check cardiac markers as well as repeating a chest x-ray in the morning. He'll be continued on bronchodilators.  #2. Diabetes. We will continue his long-acting insulin as well as placed on sliding scale insulin.  #3. Hypertension. Continue his outpatient regimen, but should hopefully improve diuresis.  #4. COPD. We will continue with nebulizer treatments. If wheezing persists despite diuresis we may consider starting steroids, although his wheezing did not seem too severe.  Further orders per the clinical course  Time Spent on Admission:  Hayze Gazda [facility] Pager: [phone] 07/19/2011, 11:13 AM

## 2011-07-19 NOTE — Care Management Note (Signed)
    Page 1 of 1   07/21/2011     11:54:51 AM   CARE MANAGEMENT NOTE 07/21/2011  Patient:  [name], [name]   Account Number:  [id number]  Date Initiated:  07/19/2011  Documentation initiated by:  Rosemary Holms  Subjective/Objective Assessment:   Pt admitted from home where he lives with his wife. DX COPD, CHF, CKD. Not on O2 at home     Action/Plan:   Spoke to pt at bedside. Daughter also at bedside. Pt denies every having HH. CM will follow but could benefit from [facility] RN and Disease management. O2 sats need to be monitored as well.   Anticipated DC Date:  07/21/2011   Anticipated DC Plan:  HOME W HOME HEALTH SERVICES      DC Planning Services  CM consult      Choice offered to / List presented to:  C-1 Patient           Status of service:  Completed, signed off Medicare Important Message given?   (If response is "NO", the following Medicare IM given date fields will be blank) Date Medicare IM given:   Date Additional Medicare IM given:    Discharge Disposition:  HOME W HOME HEALTH SERVICES  Per UR Regulation:    If discussed at Long Length of Stay Meetings, dates discussed:    Comments:  07/21/11 1000 Rosemary Holms RN BSN CM  07/19/11 1000 Traevon Meiring Leanord Hawking RN BSN CM

## 2011-07-19 NOTE — ED Notes (Signed)
Attempted to give report. Nurse unable to take report at present

## 2011-07-19 NOTE — Progress Notes (Signed)
*  PRELIMINARY RESULTS* Echocardiogram 2D Echocardiogram has been performed.  [name] 07/19/2011, 3:34 PM

## 2011-07-19 NOTE — ED Notes (Signed)
C/o shortness of breath, worse at night; states, "I'm okay till I go to bed"; reports unable to tolerate lying flat.  Denies pain.

## 2011-07-19 NOTE — ED Notes (Signed)
Pt reports SOB worse at night when he lays down. Non productive cough.

## 2011-07-19 NOTE — Telephone Encounter (Signed)
Noted,

## 2011-07-20 ENCOUNTER — Inpatient Hospital Stay (HOSPITAL_COMMUNITY): Payer: Medicare Other

## 2011-07-20 DIAGNOSIS — R0609 Other forms of dyspnea: Secondary | ICD-10-CM

## 2011-07-20 LAB — GLUCOSE, CAPILLARY: Glucose-Capillary: 122 mg/dL — ABNORMAL HIGH (ref 70–99)

## 2011-07-20 LAB — BASIC METABOLIC PANEL
Calcium: 9.1 mg/dL (ref 8.4–10.5)
GFR calc non Af Amer: 18 mL/min — ABNORMAL LOW (ref >90)
Glucose, Bld: 107 mg/dL — ABNORMAL HIGH (ref 70–99)
Sodium: 137 mEq/L (ref 135–145)

## 2011-07-20 LAB — CARDIAC PANEL(CRET KIN+CKTOT+MB+TROPI)
CK, MB: 3.3 ng/mL (ref 0.3–4.0)
Relative Index: 1.3 (ref 0.0–2.5)
Total CK: 254 U/L — ABNORMAL HIGH (ref 7–232)
Troponin I: <0.3 ng/mL

## 2011-07-20 MED ORDER — INSULIN ASPART 100 UNIT/ML ~~LOC~~ SOLN
0.0000 [IU] | Freq: Three times a day (TID) | SUBCUTANEOUS | Status: DC
  Administered 2011-07-20: 3 [IU] via SUBCUTANEOUS
  Administered 2011-07-20: 2 [IU] via SUBCUTANEOUS

## 2011-07-20 MED ORDER — FUROSEMIDE 10 MG/ML IJ SOLN
80.0000 mg | Freq: Three times a day (TID) | INTRAMUSCULAR | Status: DC
  Administered 2011-07-20 – 2011-07-21 (×3): 80 mg via INTRAVENOUS
  Filled 2011-07-20 (×3): qty 8

## 2011-07-20 MED ORDER — INSULIN ASPART 100 UNIT/ML ~~LOC~~ SOLN: 0.0000 [IU] | Freq: Every day | SUBCUTANEOUS | Status: DC

## 2011-07-20 NOTE — Assessment & Plan Note (Addendum)
  Acute exaccerbation of symptoms aggressive anti inflammatory

## 2011-07-20 NOTE — Assessment & Plan Note (Signed)
Controlled, no change in medication  

## 2011-07-20 NOTE — Assessment & Plan Note (Signed)
I still believe that a lot of current symptoms are related to heart failure and pt needs cardiology evaluation as soon as possible, elevated BNP and history

## 2011-07-20 NOTE — Assessment & Plan Note (Signed)
States quit x 5 days, has copd and is at high risk for lung Ca based on smoking history, chest scan ordered, cesation counselling done

## 2011-07-20 NOTE — Assessment & Plan Note (Signed)
Sub optimal control, no med change at this time 

## 2011-07-20 NOTE — Assessment & Plan Note (Signed)
Nicotine x 40 plus years, needs chest Ct

## 2011-07-20 NOTE — Progress Notes (Signed)
Subjective: He is feeling better today. Reports that his breathing is improving. Denies any chest pain.  Objective: Vital signs in last 24 hours: Temp:  [98.1 F (36.7 C)-98.3 F (36.8 C)] 98.1 F (36.7 C) (07/03 0647) Pulse Rate:  [67-76] 67  (07/03 0647) Resp:  [18-20] 18  (07/03 0647) BP: (126-189)/(52-66) 126/52 mmHg (07/03 0647) SpO2:  [90 %-99 %] 98 % (07/03 0736) Weight:  [107.9 kg (237 lb 14 oz)] 107.9 kg (237 lb 14 oz) (07/03 0647) Weight change: 1.351 kg (2 lb 15.7 oz) Last BM Date: 07/19/11  Intake/Output from previous day: 07/02 0701 - 07/03 0700 In: 248 [P.O.:240; IV Piggyback:8] Out: 1200 [Urine:1200]     Physical Exam: General: Alert, awake, oriented x3, in no acute distress. HEENT: No bruits, no goiter. Heart: Regular rate and rhythm, without murmurs, rubs, gallops. Lungs: Clear to auscultation bilaterally. Abdomen: Soft, nontender, nondistended, positive bowel sounds. Extremities: Trace edema bilaterally Neuro: Grossly intact, nonfocal.    Lab Results: Basic Metabolic Panel:  Basename 07/20/11 0316 07/19/11 0601  NA 137 140  K 3.5 3.6  CL 101 102  CO2 26 26  GLUCOSE 107* 148*  BUN 32* 31*  CREATININE 3.28* 3.25*  CALCIUM 9.1 9.6  MG -- --  PHOS -- --   Liver Function Tests: No results found for this basename: AST:2,ALT:2,ALKPHOS:2,BILITOT:2,PROT:2,ALBUMIN:2 in the last 72 hours No results found for this basename: LIPASE:2,AMYLASE:2 in the last 72 hours No results found for this basename: AMMONIA:2 in the last 72 hours CBC:  Basename 07/19/11 0601  WBC 7.7  NEUTROABS 5.3  HGB 11.1*  HCT 32.8*  MCV 92.1  PLT 214   Cardiac Enzymes:  Basename 07/20/11 0312 07/19/11 1913 07/19/11 1145  CKTOTAL 254* 320* 357*  CKMB 3.3 4.6* 4.6*  CKMBINDEX -- -- --  TROPONINI <0.30 <0.30 <0.30   BNP:  Basename 07/19/11 0601  PROBNP 23725.0*   D-Dimer: No results found for this basename: DDIMER:2 in the last 72 hours CBG:  Basename 07/20/11  1103 07/19/11 2208  GLUCAP 122* 163*   Hemoglobin A1C: No results found for this basename: HGBA1C in the last 72 hours Fasting Lipid Panel: No results found for this basename: CHOL,HDL,LDLCALC,TRIG,CHOLHDL,LDLDIRECT in the last 72 hours Thyroid Function Tests:  Basename 07/19/11 1145  TSH 1.180  T4TOTAL --  FREET4 --  T3FREE --  THYROIDAB --   Anemia Panel: No results found for this basename: VITAMINB12,FOLATE,FERRITIN,TIBC,IRON,RETICCTPCT in the last 72 hours Coagulation: No results found for this basename: LABPROT:2,INR:2 in the last 72 hours Urine Drug Screen: Drugs of Abuse  No results found for this basename: labopia, cocainscrnur, labbenz, amphetmu, thcu, labbarb    Alcohol Level: No results found for this basename: ETH:2 in the last 72 hours Urinalysis: No results found for this basename: COLORURINE:2,APPERANCEUR:2,LABSPEC:2,PHURINE:2,GLUCOSEU:2,HGBUR:2,BILIRUBINUR:2,KETONESUR:2,PROTEINUR:2,UROBILINOGEN:2,NITRITE:2,LEUKOCYTESUR:2 in the last 72 hours  No results found for this or any previous visit (from the past 240 hour(s)).  Studies/Results: Dg Chest 2 View  07/20/2011  *RADIOLOGY REPORT*  Clinical Data: CHF  CHEST - 2 VIEW  Comparison: 07/19/2011  Findings: There are small bilateral pleural effusions.  There is pulmonary venous congestion without frank edema.  Bibasilar scarring is noted, left greater than right.  IMPRESSION:  1.  Mild CHF.  Original Report Authenticated By: Rosealee Albee, M.D.   Dg Chest 2 View  07/19/2011  *RADIOLOGY REPORT*  Clinical Data: Shortness of breath  CHEST - 2 VIEW  Comparison: 07/15/2011  Findings: Hyperinflation with flattened hemidiaphragms and coarse interstitial markings.  Cardiomegaly with  central vascular congestion. Mild aortic prominence / tortuosity similar to prior. Aortic atherosclerotic calcification.  Superimposed interstitial prominence.  No pleural effusion or pneumothorax.  Multilevel degenerative changes and osteopenia.   No acute osseous finding.  IMPRESSION: Cardiomegaly with central vascular congestion.  Mild interstitial edema is suggested.  These findings are superimposed on coarse interstitial markings and hyperinflation, in keeping with COPD.  Original Report Authenticated By: Waneta Martins, M.D.    Medications: Scheduled Meds:   . sodium chloride   Intravenous STAT  . albuterol  2.5 mg Nebulization Q6H  . aspirin EC  81 mg Oral Daily  . calcitRIOL  0.25 mcg Oral Daily  . cloNIDine  0.3 mg Oral BID  . enoxaparin  30 mg Subcutaneous Q24H  . furosemide  80 mg Intravenous BID  . insulin aspart  0-15 Units Subcutaneous TID WC  . insulin aspart  0-5 Units Subcutaneous QHS  . insulin glargine  30 Units Subcutaneous QHS  . isosorbide-hydrALAZINE  1 tablet Oral BID  . labetalol  600 mg Oral BID  . potassium chloride SA  20 mEq Oral Daily  . sodium chloride  3 mL Intravenous Q12H  . terazosin  20 mg Oral QHS   Continuous Infusions:  PRN Meds:.sodium chloride, acetaminophen, albuterol, ondansetron (ZOFRAN) IV, sodium chloride, DISCONTD: albuterol  Assessment/Plan:  Principal Problem:  *Acute diastolic congestive heart failure Active Problems:  DIABETES MELLITUS, TYPE II, WITH RETINOPATHY  HYPERTENSION  Chronic kidney disease, stage 4, severely decreased GFR  COPD (chronic obstructive pulmonary disease)  Dyspnea  Plan:  #1 acute diastolic congestive heart failure. Patient reports that his respiratory status is improving. He feels significantly better since admission. We will titrate his oxygen down. We will continue IV diuresis. His cardiac markers are negative. We will followup on 2-D echocardiogram. Treatment will be medical therapy at this point since he's not a good candidate for cardiac catheterization due to his chronic kidney disease. He will need followup with cardiology as an outpatient. If he continues to improve, then he can possibly be discharged home tomorrow.  #2. Diabetes.  Continue long-acting insulin as well as sliding scale insulin.  #3. Hypertension. Significantly improved since admission.  #4. COPD. Wheezing has since improved. He is on nebulizer treatments.  #5. Chronic kidney disease stage IV. Creatinine appears to be stable.   LOS: 1 day   MEMON,JEHANZEB [facility] Pager: [phone] 07/20/2011, 1:20 PM

## 2011-07-21 ENCOUNTER — Encounter (HOSPITAL_COMMUNITY): Payer: Self-pay | Admitting: Internal Medicine

## 2011-07-21 DIAGNOSIS — I5021 Acute systolic (congestive) heart failure: Principal | ICD-10-CM

## 2011-07-21 DIAGNOSIS — I5022 Chronic systolic (congestive) heart failure: Secondary | ICD-10-CM | POA: Diagnosis present

## 2011-07-21 LAB — BASIC METABOLIC PANEL
BUN: 41 mg/dL — ABNORMAL HIGH (ref 6–23)
Chloride: 101 mEq/L (ref 96–112)
Creatinine, Ser: 3.58 mg/dL — ABNORMAL HIGH (ref 0.50–1.35)
GFR calc Af Amer: 19 mL/min — ABNORMAL LOW (ref >90)
Glucose, Bld: 96 mg/dL (ref 70–99)

## 2011-07-21 LAB — GLUCOSE, CAPILLARY: Glucose-Capillary: 80 mg/dL (ref 70–99)

## 2011-07-21 MED ORDER — METOLAZONE 2.5 MG PO TABS: 2.5000 mg | ORAL_TABLET | Freq: Every day | ORAL | Status: DC | PRN

## 2011-07-21 MED ORDER — ISOSORB DINITRATE-HYDRALAZINE 20-37.5 MG PO TABS: 1.0000 | ORAL_TABLET | Freq: Three times a day (TID) | ORAL | Status: DC

## 2011-07-21 NOTE — Discharge Summary (Signed)
Patient ID:  [name]  MRN: [id number]  DOB/AGE: [birth year] 66 y.o.   Admit date: 07/19/2011  Discharge date: 07/21/2011   Primary Care Physician: Syliva Overman, MD  Cardiologist: Dr. Dietrich Pates  Nephrologist: Dr. Briant Cedar   Discharge Diagnoses:  Principal Problem:  *Acute systolic CHF (congestive heart failure)  Active Problems:  DIABETES MELLITUS, TYPE II, WITH RETINOPATHY  HYPERTENSION  Chronic kidney disease, stage 4, severely decreased GFR  COPD (chronic obstructive pulmonary disease)  Dyspnea   Medication List  As of 07/21/2011 10:11 AM    STOP taking these medications          hydrALAZINE 25 MG tablet      predniSONE 5 MG Tabs       TAKE these medications          ASPIRIN LOW DOSE 81 MG EC tablet      Generic drug: aspirin      Take 81 mg by mouth daily.      calcitRIOL 0.25 MCG capsule      Commonly known as: ROCALTROL      Take 0.25 mcg by mouth daily.      cloNIDine 0.3 MG tablet      Commonly known as: CATAPRES      Take 0.3 mg by mouth 2 (two) times daily.      insulin glargine 100 UNIT/ML injection      Commonly known as: LANTUS      Inject 30 Units into the skin at bedtime.      isosorbide-hydrALAZINE 20-37.5 MG per tablet      Commonly known as: BIDIL      Take 1 tablet by mouth 3 (three) times daily.      labetalol 300 MG tablet      Commonly known as: NORMODYNE      Take 600 mg by mouth 2 (two) times daily.      metolazone 2.5 MG tablet      Commonly known as: ZAROXOLYN      Take 1 tablet (2.5 mg total) by mouth daily as needed (swelling, shortness of breath).      potassium chloride SA 20 MEQ tablet      Commonly known as: K-DUR,KLOR-CON      Take 20 mEq by mouth daily.      torsemide 100 MG tablet      Commonly known as: DEMADEX      Take 100 mg by mouth 2 (two) times daily.         Discharge Exam:  Blood pressure 148/58, pulse 71, temperature 98.2 F (36.8 C), temperature source Oral, resp. rate 18, height 5\' 10"  (1.778 m), weight 107.9  kg (237 lb 14 oz), SpO2 98.00%.  NAD  CTA B  S1, S2, RRR  Soft, NT, BS+  Trace edema b/l   Disposition and Follow-up:  Patient will follow up with cardiology next week. Follow up with primary doctor in 2 weeks. He will be set up with home health for heart failure   Consults: none   Significant Diagnostic Studies:  Dg Chest 2 View  07/20/2011 *RADIOLOGY REPORT* Clinical Data: CHF CHEST - 2 VIEW Comparison: 07/19/2011 Findings: There are small bilateral pleural effusions. There is pulmonary venous congestion without frank edema. Bibasilar scarring is noted, left greater than right. IMPRESSION: 1. Mild CHF. Original Report Authenticated By: Rosealee Albee, M.D.   Brief H and P:  For complete details please refer to admission H and P, but in brief This is  a 66 year old gentleman with history of aortic valve disease, chronic kidney disease stage IV, hypertension and diabetes. Patient presents to the emergency room today with complaints of shortness of breath. He reports feeling short of breath on and off for the past few weeks but worse so over the past week. Patient reports difficulty breathing while lying down, worse so at night. Denies any chest pain. He has had a chronic cough it is nonproductive, he has noticed increased wheezing recently as well. He's been compliant with his medication as well as his diet. He is recording his weights and has not reported a significant increase, although he does report feeling that his abdomen is swollen.. Denies any fever. He went to see his primary care physician recently and was prescribed a course of steroids for possible COPD. The emergency room he was evaluated and there was noted to be some vascular congestion on his chest x-ray and it was noted that his BN peptide was elevated above baseline. Last available echocardiogram is from August of 2012 showed preserved ejection fraction. He has been referred for admission.   Hospital Course:   [name] and was found to be in acute CHF. His last echo was done last year which showed a preserved ejection fraction. Echo was repeated on this admission and EF was noted to be depressed at 25-30%. There was diffuse hypokinesis which is likely due to hypertensive heart disease. He was not having any chest pain and his cardiac markers were negative. I discussed the case with Dr. Diona Browner, and it was felt that with his chronic kidney disease, not on dialysis yet, that he was a poor candidate for any aggressive intervention such as cardiac catheterization. Medical management was recommended with close cardiology follow up. Patient was aggressively diuresed with lasix. His fluid intake was reduced. He feels significantly better. He is no longer short of breath and he is ambulating in the halls without difficulty. He was able to sleep flat in bed and did not complain of any orthopnea. He is requesting discharge home. [name] on demadex and add metolazone as needed for shortness of breath and swelling. His renal function appears to be tolerating this. I have asked him to follow up with Dr. Dietrich Pates next week. He will have blood work done at that time. He will be set up with [facility] heart failure home health. He is clinically improved and stable for discharge.   Time spent on Discharge:    Signed:  MEMON,JEHANZEB  [facility]  Pager: [phone]  07/21/2011, 10:11 AM

## 2011-07-21 NOTE — Progress Notes (Signed)
D/c instructions reviewed with patient and wife.  Verbalized understanding.  Pt dc'd to home with wife.  Schonewitz, Candelaria Stagers 07/21/2011

## 2011-07-21 NOTE — Progress Notes (Deleted)
Physician Discharge Summary  Patient ID: [name] MRN: [id number] DOB/AGE: 08-26-1945 66 y.o.  Admit date: 07/19/2011 Discharge date: 07/21/2011  Primary Care Physician:  Harold Overman, MD Cardiologist: Dr. Dietrich [name] Nephrologist: Dr. Briant [name]  Discharge Diagnoses:    Principal Problem:  *Acute systolic CHF (congestive heart failure) Active Problems:  DIABETES MELLITUS, TYPE II, WITH RETINOPATHY  HYPERTENSION  Chronic kidney disease, stage 4, severely decreased GFR  COPD (chronic obstructive pulmonary disease)  Dyspnea    Medication List  As of 07/21/2011 10:11 AM   STOP taking these medications         hydrALAZINE 25 MG tablet      predniSONE 5 MG Tabs         TAKE these medications         ASPIRIN LOW DOSE 81 MG EC tablet   Generic drug: aspirin   Take 81 mg by mouth daily.      calcitRIOL 0.25 MCG capsule   Commonly known as: ROCALTROL   Take 0.25 mcg by mouth daily.      cloNIDine 0.3 MG tablet   Commonly known as: CATAPRES   Take 0.3 mg by mouth 2 (two) times daily.      insulin glargine 100 UNIT/ML injection   Commonly known as: LANTUS   Inject 30 Units into the skin at bedtime.      isosorbide-hydrALAZINE 20-37.5 MG per tablet   Commonly known as: BIDIL   Take 1 tablet by mouth 3 (three) times daily.      labetalol 300 MG tablet   Commonly known as: NORMODYNE   Take 600 mg by mouth 2 (two) times daily.      metolazone 2.5 MG tablet   Commonly known as: ZAROXOLYN   Take 1 tablet (2.5 mg total) by mouth daily as needed (swelling, shortness of breath).      potassium chloride SA 20 MEQ tablet   Commonly known as: K-DUR,KLOR-CON   Take 20 mEq by mouth daily.      torsemide 100 MG tablet   Commonly known as: DEMADEX   Take 100 mg by mouth 2 (two) times daily.           Discharge Exam: Blood pressure 148/58, pulse 71, temperature 98.2 F (36.8 C), temperature source Oral, resp. rate 18, height 5\' 10"  (1.778 m), weight 107.9 kg (237 lb 14  oz), SpO2 98.00%. NAD CTA B S1, S2, RRR Soft, NT, BS+ Trace edema b/l  Disposition and Follow-up:  Patient will follow up with cardiology next week.  Follow up with primary doctor in 2 weeks. He will be set up with home health for heart failure  Consults:  none   Significant Diagnostic Studies:  Dg Chest 2 View  07/20/2011  *RADIOLOGY REPORT*  Clinical Data: CHF  CHEST - 2 VIEW  Comparison: 07/19/2011  Findings: There are small bilateral pleural effusions.  There is pulmonary venous congestion without frank edema.  Bibasilar scarring is noted, left greater than right.  IMPRESSION:  1.  Mild CHF.  Original Report Authenticated By: [name], M.D.    Brief H and P: For complete details please refer to admission H and P, but in brief This is a 66 year old gentleman with history of aortic valve disease, chronic kidney disease stage IV, hypertension and diabetes. Patient presents to the emergency room today with complaints of shortness of breath. He reports feeling short of breath on and off for the past few weeks but worse so over the past  week. Patient reports difficulty breathing while lying down, worse so at night. Denies any chest pain. He has had a chronic cough it is nonproductive, he has noticed increased wheezing recently as well. He's been compliant with his medication as well as his diet. He is recording his weights and has not reported a significant increase, although he does report feeling that his abdomen is swollen.. Denies any fever. He went to see his primary care physician recently and was prescribed a course of steroids for possible COPD. The emergency room he was evaluated and there was noted to be some vascular congestion on his chest x-ray and it was noted that his BN peptide was elevated above baseline. Last available echocardiogram is from August of 2012 showed preserved ejection fraction. He has been referred for admission.   Hospital Course:  Principal Problem:   *Acute systolic CHF (congestive heart failure) Active Problems:  DIABETES MELLITUS, TYPE II, WITH RETINOPATHY  HYPERTENSION  Chronic kidney disease, stage 4, severely decreased GFR  COPD (chronic obstructive pulmonary disease)  Dyspnea  This gentleman was admitted with acute shortness of breath and was found to be in acute CHF.  His last echo was done last year which showed a preserved ejection fraction. Echo was repeated on this admission and EF was noted to be depressed at 25-30%. There was diffuse hypokinesis which is likely due to hypertensive heart disease.  He was not having any chest pain and his cardiac markers were negative. I discussed the case with Dr. Diona [name], and it was felt that with his chronic kidney disease, not on dialysis yet, that he was a poor candidate for any aggressive intervention such as cardiac catheterization.  Medical management was recommended with close cardiology follow up. Patient was aggressively diuresed with lasix. His fluid intake was reduced.  He feels significantly better.  He is no longer short of breath and he is ambulating in the halls without difficulty.  He was able to sleep flat in bed and did not complain of any orthopnea.  He is requesting discharge home.  [name] on demadex and add metolazone as needed for shortness of breath and swelling. His renal function appears to be tolerating this. I have asked him to follow up with Dr. Dietrich [name] next week. He will have blood work done at that time.  He will be set up with [facility].  He is clinically improved and stable for discharge.  Time spent on Discharge:  Signed: MEMON,Harold [facility] Pager: [phone] 07/21/2011, 10:11 AM

## 2011-07-25 ENCOUNTER — Encounter: Payer: Self-pay | Admitting: Cardiology

## 2011-07-25 ENCOUNTER — Ambulatory Visit (INDEPENDENT_AMBULATORY_CARE_PROVIDER_SITE_OTHER): Payer: Medicare Other | Admitting: Cardiology

## 2011-07-25 ENCOUNTER — Ambulatory Visit (HOSPITAL_COMMUNITY): Payer: Medicare Other

## 2011-07-25 ENCOUNTER — Other Ambulatory Visit: Payer: Self-pay | Admitting: Cardiology

## 2011-07-25 VITALS — BP 140/49 | HR 73 | Ht 70.0 in | Wt 234.0 lb

## 2011-07-25 DIAGNOSIS — I10 Essential (primary) hypertension: Secondary | ICD-10-CM

## 2011-07-25 DIAGNOSIS — N184 Chronic kidney disease, stage 4 (severe): Secondary | ICD-10-CM

## 2011-07-25 DIAGNOSIS — I359 Nonrheumatic aortic valve disorder, unspecified: Secondary | ICD-10-CM

## 2011-07-25 DIAGNOSIS — E860 Dehydration: Secondary | ICD-10-CM

## 2011-07-25 DIAGNOSIS — I5022 Chronic systolic (congestive) heart failure: Secondary | ICD-10-CM

## 2011-07-25 DIAGNOSIS — I509 Heart failure, unspecified: Secondary | ICD-10-CM

## 2011-07-25 MED ORDER — HYDRALAZINE HCL 25 MG PO TABS: 37.5000 mg | ORAL_TABLET | Freq: Three times a day (TID) | ORAL | Status: DC

## 2011-07-25 MED ORDER — ISOSORBIDE DINITRATE 20 MG PO TABS: 20.0000 mg | ORAL_TABLET | Freq: Three times a day (TID) | ORAL | Status: DC

## 2011-07-25 NOTE — Assessment & Plan Note (Signed)
He is clearly prerenal. I've advised him to stop his metolazone. His weight range to be  237-43. He will weigh himself daily. I've asked him to drink more water the next few days until weight up  4 pounds. We talked about how to avoid syncope. I discontinued his Hytrin since he started on an alpha-blocker and he has systolic heart failure now. In addition he does not have a prostate issue.  His weight is 02/18/1943 he will take metolazone on a when necessary basis. Otherwise he will stay on Demadex 100 twice a day.  I have him followup with me on July 22 were close followup. We'll check electrolytes then. I am sure his BUN and creatinine are extremely high again.

## 2011-07-25 NOTE — Progress Notes (Signed)
HPI [name] comes in today for close followup after being discharged from the hospital on 07/21/2011 with new onset acute systolic heart failure.  He has a history of difficult to control hypertension. He has chronic kidney disease. He has mild aortic stenosis by his most recent echo in the hospital. He has diffuse hypokinesia with ejection fraction 20-25%.  Looking back through all the notes, which took me over 20 minutes, he has had acute on chronic renal failure with a creatinine as high as 5.5 in the last several months. He is seeing nephrology who has made adjustments in his meds.  He was discharged on Demadex 100 mg twice a day and metolazone 2.5 mg every morning. Since discharge his weight has dropped 11 pounds. His dry weight when he went home was about to 240.  This morning while he was at his daughter's he was standing and became lightheaded and fainted. There was no injury. He did experience some nausea and some vomiting.  He also had syncope back in the spring when he was severely dehydrated.  He's taking both Hytrin and clonidine. He does not have prostate issues.  Past Medical History  Diagnosis Date  . Congestive heart failure 10/2008    preserved LV systolic function   . Aortic insufficiency 09/2008    Normal left ventricular size and function : graded as mild by Doppler , but pulse pressure is widened  . 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 Hypertension   . 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 Erectile dysfunction   . Colonic polyp   . Low serum testosterone level   . Tobacco abuse   . Sleep apnea 2010    Initially unable to afford CPAP  . Benign prostatic hypertrophy   . Obesity, Class I, BMI 30-34.9   . Hyperlipidemia   . Congestive heart failure (CHF)     EF 25-30% 07/2011    Current Outpatient Prescriptions  Medication Sig Dispense Refill  . aspirin (ASPIRIN LOW  DOSE) 81 MG EC tablet Take 81 mg by mouth daily.        . calcitRIOL (ROCALTROL) 0.25 MCG capsule Take 0.25 mcg by mouth daily.        . cloNIDine (CATAPRES) 0.3 MG tablet Take 0.3 mg by mouth 2 (two) times daily.      . insulin glargine (LANTUS) 100 UNIT/ML injection Inject 30 Units into the skin at bedtime.      . isosorbide-hydrALAZINE (BIDIL) 20-37.5 MG per tablet Take 1 tablet by mouth 3 (three) times daily.  90 tablet  1  . labetalol (NORMODYNE) 300 MG tablet Take 600 mg by mouth 2 (two) times daily.      . metolazone (ZAROXOLYN) 2.5 MG tablet Take 1 tablet (2.5 mg total) by mouth daily as needed (swelling, shortness of breath).  30 tablet  0  . potassium chloride SA (K-DUR,KLOR-CON) 20 MEQ tablet Take 20 mEq by mouth daily.      Marland Kitchen torsemide (DEMADEX) 100 MG tablet Take 100 mg by mouth 2 (two) times daily.         Allergies  Allergen Reactions  . Ace Inhibitors     REACTION: cough    Family History  Problem Relation Age of Onset  . Hypertension Mother   . Cancer Mother   . Cancer Father   . Diabetes Sister  History   Social History  . Marital Status: Married    Spouse Name: N/A    Number of Children: 4  . Years of Education: N/A   Occupational History  . 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 Smoking status: Smoker, Current Status Unknown -- 1.0 packs/day for 45 years    Types: Cigarettes  . Smokeless tobacco: Never Used  . Alcohol Use: 0.5 oz/week    1 drink(s) per week     occ  . Drug Use: No  . Sexually Active: Not on file   Other Topics Concern  . Not on file   Social History Narrative  . No narrative on file    ROS ALL NEGATIVE EXCEPT THOSE NOTED IN HPI  PE  General Appearance: well developed, well nourished in no acute distress, overweight but muscular HEENT: symmetrical face, PERRLA,  Neck: no JVD, thyromegaly, or adenopathy, trachea midline Chest: symmetric without deformity Cardiac: PMI non-displaced, RRR, normal S1,  S2, no gallop AS murmur Lung: clear to ausculation and percussion Vascular: all pulses full without bruits  Abdominal: nondistended, nontender, good bowel sounds, no HSM, no bruits Extremities: no cyanosis, clubbing or edema, no sign of DVT, no varicosities  Skin: normal color, no rashes Neuro: alert and oriented x 3, non-focal Pysch: normal affect  EKG  BMET    Component Value Date/Time   NA 139 07/21/2011 0629   K 3.3* 07/21/2011 0629   CL 101 07/21/2011 0629   CO2 25 07/21/2011 0629   GLUCOSE 96 07/21/2011 0629   BUN 41* 07/21/2011 0629   CREATININE 3.58* 07/21/2011 0629   CREATININE 3.63* 07/12/2011 1815   CALCIUM 9.0 07/21/2011 0629   GFRNONAA 16* 07/21/2011 0629   GFRAA 19* 07/21/2011 0629    Lipid Panel     Component Value Date/Time   CHOL 171 05/01/2011 0055   TRIG 90 05/01/2011 0055   HDL 57 05/01/2011 0055   CHOLHDL 3.0 05/01/2011 0055   VLDL 18 05/01/2011 0055   LDLCALC 96 05/01/2011 0055    CBC    Component Value Date/Time   WBC 7.7 07/19/2011 0601   RBC 3.56* 07/19/2011 0601   HGB 11.1* 07/19/2011 0601   HCT 32.8* 07/19/2011 0601   PLT 214 07/19/2011 0601   MCV 92.1 07/19/2011 0601   MCH 31.2 07/19/2011 0601   MCHC 33.8 07/19/2011 0601   RDW 14.7 07/19/2011 0601   LYMPHSABS 1.4 07/19/2011 0601   MONOABS 0.6 07/19/2011 0601   EOSABS 0.3 07/19/2011 0601   BASOSABS 0.0 07/19/2011 0601

## 2011-07-25 NOTE — Patient Instructions (Addendum)
Your physician has recommended you make the following change in your medication:   Take your Metolazone if your weight is 243 or above.   Your physician recommends that you weigh, daily, at the same time every day, and in the same amount of clothing. Please record your daily weights on the handout provided and bring it to your next appointment. Your weight range is 237-243.  Once your weight has reached goal you will need to have lab work drawn :  BMET    Your physician recommends that you schedule a follow-up appointment on July 22,3013 with Dr. Daleen Squibb

## 2011-07-25 NOTE — Telephone Encounter (Signed)
Neysa Bonito w/ [employer] wants to speak with nurse regarding above patient. / tg

## 2011-07-26 ENCOUNTER — Other Ambulatory Visit: Payer: Self-pay

## 2011-07-26 ENCOUNTER — Telehealth: Payer: Self-pay | Admitting: Cardiology

## 2011-07-26 NOTE — Telephone Encounter (Signed)
Patient needs note stating that he is not to return to work until after 08/08/11.  That is what he was told by Dr.Wall on 07/25/11. / tg

## 2011-07-26 NOTE — Telephone Encounter (Signed)
Letter ready for pt. To pick up at front office, pt. aware./LV

## 2011-07-27 ENCOUNTER — Other Ambulatory Visit: Payer: Self-pay | Admitting: *Deleted

## 2011-07-27 MED ORDER — LABETALOL HCL 300 MG PO TABS: 600.0000 mg | ORAL_TABLET | Freq: Two times a day (BID) | ORAL | Status: DC

## 2011-07-28 ENCOUNTER — Telehealth: Payer: Self-pay | Admitting: Cardiology

## 2011-07-28 NOTE — Telephone Encounter (Signed)
PT WAS PUT ON ISOSORB DINIT/ HYDRALAZINE HCL / PT WANTS TO KNOW IF HE CAN CUT THE PILL IN HALF TO TAKE HALF THE DOSE A DAY. PT STATES HE HAS NO ENERGY WHEN HE TAKES IT.

## 2011-07-29 NOTE — Telephone Encounter (Signed)
Please advise./LV 

## 2011-08-01 ENCOUNTER — Ambulatory Visit (INDEPENDENT_AMBULATORY_CARE_PROVIDER_SITE_OTHER): Payer: Medicare Other | Admitting: Family Medicine

## 2011-08-01 VITALS — BP 140/60 | HR 72 | Resp 16 | Ht 71.0 in | Wt 232.1 lb

## 2011-08-01 DIAGNOSIS — E1139 Type 2 diabetes mellitus with other diabetic ophthalmic complication: Secondary | ICD-10-CM

## 2011-08-01 DIAGNOSIS — I509 Heart failure, unspecified: Secondary | ICD-10-CM

## 2011-08-01 DIAGNOSIS — E785 Hyperlipidemia, unspecified: Secondary | ICD-10-CM

## 2011-08-01 DIAGNOSIS — I5022 Chronic systolic (congestive) heart failure: Secondary | ICD-10-CM

## 2011-08-01 DIAGNOSIS — H579 Unspecified disorder of eye and adnexa: Secondary | ICD-10-CM

## 2011-08-01 DIAGNOSIS — N185 Chronic kidney disease, stage 5: Secondary | ICD-10-CM

## 2011-08-01 DIAGNOSIS — I10 Essential (primary) hypertension: Secondary | ICD-10-CM

## 2011-08-01 NOTE — Progress Notes (Signed)
  Subjective:    Patient ID: [name], male    DOB: 1945-12-04, 66 y.o.   MRN: [id number]  HPI Pt in for hospital f/u for heart failure. States he is intolerant of bidil 3 times daily , states he had to call EMS twice since d/c when he attempted to take 3 times daily, I have advised him to take as tolerated, and further discuss with cardiology. He has now started electric cigarettes but is still not willing to set a quit date. Blood sugars he reports have been higher in the past week since recent hospitalization   Review of Systems See HPI Denies recent fever or chills. Denies sinus pressure, nasal congestion, ear pain or sore throat. Denies chest congestion, productive cough or wheezing. Denies PND, orthopnea or leg swelling. Reports recurrent light headedness and intolerance of medication prescribed Denies abdominal pain, nausea, vomiting,diarrhea or constipation.   Denies dysuria, frequency, hesitancy or incontinence. Denies joint pain, swelling and limitation in mobility. Denies headaches, seizures, numbness, or tingling. Denies depression,does have  Anxiety over his health and ability to continue working and financial concerns Denies skin break down or rash.        Objective:   Physical Exam Patient alert and oriented and in no cardiopulmonary distress.  HEENT: No facial asymmetry, EOMI, no sinus tenderness,  oropharynx pink and moist.  Neck supple no adenopathy.no jVD  Chest: Clear to auscultation bilaterally.Decreased air entry throughout, no crackles or wheezes  CVS: S1, S2 ,murmur, no S3.  ABD: Soft non tender. Bowel sounds normal.  Ext: No edema  MS: Adequate ROM spine, shoulders, hips and knees.  Skin: Intact, no ulcerations or rash noted.  Psych: Good eye contact, normal affect. Memory intact anxious not  depressed appearing.  CNS: CN 2-12 intact, power, tone and sensation normal throughout.        Assessment & Plan:

## 2011-08-01 NOTE — Patient Instructions (Addendum)
Annual wellness exam in 6 to 8 weeks, keep August 26 appointment  Please follow the heart failure program closely, you need to weigh every day, you should not gain more than 2 pounds any day, if you do , you may be going  Back into heart failure.  I am happy that you have stopped cigarettes, now that you are on electric cigarettes. You need to set a quit date and STOP.  Please stop at check out for chest CT scan date  Bidil is excellent for heart failure, since you can only tolerate one at bedtime, continue like this , then discuss with cardiology

## 2011-08-02 NOTE — Telephone Encounter (Signed)
Okay with me. Watch blood pressure. Goal blood pressure less than 140 over less than 90.

## 2011-08-02 NOTE — Telephone Encounter (Signed)
Okay with me. Have him watch his blood pressure. Goal is less than 140 over less than 90.

## 2011-08-03 ENCOUNTER — Other Ambulatory Visit: Payer: Self-pay

## 2011-08-03 DIAGNOSIS — I5022 Chronic systolic (congestive) heart failure: Secondary | ICD-10-CM

## 2011-08-03 MED ORDER — ISOSORB DINITRATE-HYDRALAZINE 20-37.5 MG PO TABS: ORAL_TABLET | ORAL | Status: DC

## 2011-08-03 NOTE — Telephone Encounter (Signed)
**Note De-Identified  Obfuscation** Pt. Advised, he verbalized understanding./LV

## 2011-08-05 ENCOUNTER — Ambulatory Visit (INDEPENDENT_AMBULATORY_CARE_PROVIDER_SITE_OTHER): Payer: Medicare Other | Admitting: Internal Medicine

## 2011-08-05 ENCOUNTER — Encounter: Payer: Self-pay | Admitting: Internal Medicine

## 2011-08-05 VITALS — BP 151/52 | HR 72 | Temp 97.6°F | Ht 70.0 in | Wt 234.6 lb

## 2011-08-05 DIAGNOSIS — Z86010 Personal history of colonic polyps: Secondary | ICD-10-CM

## 2011-08-05 DIAGNOSIS — R1310 Dysphagia, unspecified: Secondary | ICD-10-CM

## 2011-08-05 MED ORDER — PEG 3350-KCL-NA BICARB-NACL 420 G PO SOLR: ORAL | Status: AC

## 2011-08-05 NOTE — Patient Instructions (Addendum)
Schedule egd (for early satiety) and tcs (to f/u on colon polyps)

## 2011-08-05 NOTE — Progress Notes (Signed)
Primary Care Physician:  Margaret Simpson, MD Primary Gastroenterologist:  Dr. Hally Colella Pre-Procedure History & Physical: HPI:  [name] is a 66 y.o. male here for evaluation and sees of progressive constipation early satiety and anorexia. Patient states he's not hungry these days a lot of times the patient says food tastes like" paper". He is able to eat. He also complains of filling up early. No nausea or vomiting. Absolutely no abdominal pain. No reflux symptoms no odynophagia or dysphagia. Progressive constipation. No melena or hematochezia. Has to take prune juice to have a bowel movement. States he's lost 10-15 pounds recently but that was in relation to taking Zaroxolyn for what sounds like congestive heart failure. Had a colonic adenoma removed in 2006. Gallbladder remains in situ. His long-standing diabetes and chronic kidney disease. No prior evaluation of his upper GI tract.  Past Medical History  Diagnosis Date  . Congestive heart failure 10/2008    preserved LV systolic function   . Aortic insufficiency 09/2008    Normal left ventricular size and function : graded as mild by Doppler , but pulse pressure is widened  . 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 Hypertension   . 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 Erectile dysfunction   . Colonic polyp   . Low serum testosterone level   . Tobacco abuse   . Sleep apnea 2010    Initially unable to afford CPAP  . Benign prostatic hypertrophy   . Obesity, Class I, BMI 30-34.9   . Hyperlipidemia   . Congestive heart failure (CHF)     EF 25-30% 07/2011  . Adenomatous colon polyp 2006    Past Surgical History  Procedure Date  . Retinal laser procedure     diabetic retinopathy  . Lymph node biopsy     surgical exploration of neck-not entirely clear that this represented a lymph node biopsy  . Colonoscopy 08/23/2004    Dr. Smokey Melott-normal  rectum, diminutive polyp of the rectosigmoid removed, inflamed focally adenomatous polyp    Prior to Admission medications   Medication Sig Start Date End Date Taking? Authorizing Provider  aspirin (ASPIRIN LOW DOSE) 81 MG EC tablet Take 81 mg by mouth daily.     Yes Historical Provider, MD  calcitRIOL (ROCALTROL) 0.25 MCG capsule Take 0.25 mcg by mouth daily.     Yes Historical Provider, MD  cloNIDine (CATAPRES) 0.3 MG tablet Take 0.3 mg by mouth 2 (two) times daily. 07/12/11 07/11/12 Yes Margaret E Simpson, MD  hydrALAZINE (APRESOLINE) 25 MG tablet Take 37.5 mg by mouth 3 (three) times daily.  07/05/11  Yes Historical Provider, MD  insulin glargine (LANTUS) 100 UNIT/ML injection Inject 30 Units into the skin at bedtime.   Yes Historical Provider, MD  isosorbide-hydrALAZINE (BIDIL) 20-37.5 MG per tablet Take 1/2 tablet po TID 08/03/11  Yes Thomas C Wall, MD  labetalol (NORMODYNE) 300 MG tablet Take 2 tablets (600 mg total) by mouth 2 (two) times daily. 07/27/11  Yes Cayle Cordoba M Rothbart, MD  metolazone (ZAROXOLYN) 2.5 MG tablet Take 1 tablet (2.5 mg total) by mouth daily as needed (swelling, shortness of breath). 07/21/11 07/20/12 Yes Jehanzeb Memon, MD  potassium chloride SA (K-DUR,KLOR-CON) 20 MEQ tablet Take 20 mEq by mouth daily. 01/21/11  Yes Maeson Lourenco M Rothbart, MD  torsemide (DEMADEX) 100 MG tablet Take 100 mg by mouth 2 (two)   times daily.    Yes Historical Provider, MD    Allergies as of 08/05/2011 - Review Complete 08/05/2011  Allergen Reaction Noted  . Ace inhibitors      Family History  Problem Relation Age of Onset  . Hypertension Mother   . Cancer Mother   . Cancer Father   . Diabetes Sister     History   Social History  . Marital Status: Married    Spouse Name: N/A    Number of Children: 4  . Years of Education: N/A   Occupational History  . Semi - Retired Truck driver    Social History Main Topics  . Smoking status: Smoker, Current Status Unknown -- 45 years    Types:  Cigarettes  . Smokeless tobacco: Never Used   Comment: 1-2 cigarettes  . Alcohol Use: 0.5 oz/week    1 drink(s) per week     occ  . Drug Use: No  . Sexually Active: Not on file   Other Topics Concern  . Not on file   Social History Narrative  . No narrative on file    Review of Systems: See HPI, otherwise negative ROS  Physical Exam: BP 151/52  Pulse 72  Temp 97.6 F (36.4 C) (Temporal)  Ht 5' 10" (1.778 m)  Wt 234 lb 9.6 oz (106.414 kg)  BMI 33.66 kg/m2 General:   Alert,  Well-developed, well-nourished, pleasant and cooperative in NAD Skin:  Intact without significant lesions or rashes. Eyes:  Sclera clear, no icterus.   Conjunctiva pink. Ears:  Normal auditory acuity. Nose:  No deformity, discharge,  or lesions. Mouth:  No deformity or lesions. Neck:  Supple; no masses or thyromegaly. No significant cervical adenopathy. Lungs:  Clear throughout to auscultation.   No wheezes, crackles, or rhonchi. No acute distress. Heart:  Regular rate and rhythm; no murmurs, clicks, rubs,  or gallops. Abdomen: Non-distended, normal bowel sounds.  Soft and nontender . He does have a succussion splash but just ate some corn flakes as he reports;without appreciable mass or hepatosplenomegaly.  Pulses:  Normal pulses noted. Extremities:  Without clubbing or edema.  Impression/Plan:  Pleasant 66-year-old gentleman multiple medical problems now with early satiety and some degree of anorexia along with progressing constipation. His symptoms are nonspecific. He does not have any alarm symptoms although early satiety warrants further evaluation. Could have underlying diabetic gastroparesis. I really don't get the sense that he has any symptoms emanating from his gallbladder. Progressive constipation may or may not be related to his early satiety symptoms. History of colonic adenoma.  Recommendations: Diagnostic EGD followed by screening colonoscopy in the near future at the hospital. The risks,  benefits, limitations, imponderables and alternatives regarding both EGD and colonoscopy have been reviewed with the patient. Questions have been answered. All parties agreeable.  

## 2011-08-08 ENCOUNTER — Encounter: Payer: Self-pay | Admitting: Cardiology

## 2011-08-08 ENCOUNTER — Ambulatory Visit (INDEPENDENT_AMBULATORY_CARE_PROVIDER_SITE_OTHER): Payer: Medicare Other | Admitting: Cardiology

## 2011-08-08 VITALS — BP 145/56 | HR 74 | Ht 70.0 in | Wt 232.0 lb

## 2011-08-08 DIAGNOSIS — I509 Heart failure, unspecified: Secondary | ICD-10-CM

## 2011-08-08 DIAGNOSIS — F17200 Nicotine dependence, unspecified, uncomplicated: Secondary | ICD-10-CM

## 2011-08-08 DIAGNOSIS — I5022 Chronic systolic (congestive) heart failure: Secondary | ICD-10-CM

## 2011-08-08 DIAGNOSIS — I10 Essential (primary) hypertension: Secondary | ICD-10-CM

## 2011-08-08 DIAGNOSIS — E785 Hyperlipidemia, unspecified: Secondary | ICD-10-CM

## 2011-08-08 DIAGNOSIS — H579 Unspecified disorder of eye and adnexa: Secondary | ICD-10-CM

## 2011-08-08 DIAGNOSIS — N184 Chronic kidney disease, stage 4 (severe): Secondary | ICD-10-CM

## 2011-08-08 DIAGNOSIS — E1139 Type 2 diabetes mellitus with other diabetic ophthalmic complication: Secondary | ICD-10-CM

## 2011-08-08 DIAGNOSIS — R55 Syncope and collapse: Secondary | ICD-10-CM

## 2011-08-08 MED ORDER — HYDRALAZINE HCL 25 MG PO TABS: 25.0000 mg | ORAL_TABLET | Freq: Three times a day (TID) | ORAL | Status: DC

## 2011-08-08 NOTE — Assessment & Plan Note (Signed)
He most likely is having vasovagal syncope. He is orthostatic but not low enough to cause syncope. He has not tolerated the isosorbide component well since discharge. He is convinced this is what it is. It does not sound arrhythmogenic.  I have stopped his isosorbide hydralazine and placed him on hydralazine 25 mg by mouth 3 times a day. We will see him back in the office in several days for close followup. His dose may need to be increased depending on his blood pressure.

## 2011-08-08 NOTE — Patient Instructions (Addendum)
Your physician has recommended you make the following change in your medication: STOP  Bidil Start: Hydralazine 25mg  1 tablet three times a day  Your physician recommends that you schedule a follow-up appointment Friday July 22,13 with Dr. Daleen Squibb.  Your physician recommends that you return for lab work on Friday July 22,13  For a BMP

## 2011-08-08 NOTE — Progress Notes (Signed)
HPI [name] comes in today because of recurrent syncope.  He's had 3 episodes of this since being discharged from the hospital earlier this month. He thinks is secondary to the isosorbide hydralazine combination. It has caused headaches and also nausea.  Today, around 10 AM, he felt nauseated and then broke out in a sweat. He went down. He was not [name] long period he was not confused when he came around. There is no suggestion of seizure activity. There was no injury.  He denies any nausea or vomiting other than he did vomit after this episode. He denies abdominal pain, fever, chills, or change in bowel habits. He suffered from chronic constipation.  He denied any palpitations or chest pain prior to the event.  Past Medical History  Diagnosis Date  . Congestive heart failure 10/2008    preserved LV systolic function   . Aortic insufficiency 09/2008    Normal left ventricular size and function : graded as mild by Doppler , but pulse pressure is widened  . 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 Hypertension   . 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 Erectile dysfunction   . Colonic polyp   . Low serum testosterone level   . Tobacco abuse   . Sleep apnea 2010    Initially unable to afford CPAP  . Benign prostatic hypertrophy   . Obesity, Class I, BMI 30-34.9   . Hyperlipidemia   . Congestive heart failure (CHF)     EF 25-30% 07/2011  . Adenomatous colon polyp 2006    Current Outpatient Prescriptions  Medication Sig Dispense Refill  . aspirin (ASPIRIN LOW DOSE) 81 MG EC tablet Take 81 mg by mouth daily.        . calcitRIOL (ROCALTROL) 0.25 MCG capsule Take 0.25 mcg by mouth daily.        . cloNIDine (CATAPRES) 0.3 MG tablet Take 0.3 mg by mouth 2 (two) times daily.      . insulin glargine (LANTUS) 100 UNIT/ML injection Inject 30 Units into the skin at bedtime.      Marland Kitchen labetalol (NORMODYNE)  300 MG tablet Take 2 tablets (600 mg total) by mouth 2 (two) times daily.  120 tablet  6  . metolazone (ZAROXOLYN) 2.5 MG tablet Take 1 tablet (2.5 mg total) by mouth daily as needed (swelling, shortness of breath).  30 tablet  0  . potassium chloride SA (K-DUR,KLOR-CON) 20 MEQ tablet Take 20 mEq by mouth daily.      Marland Kitchen torsemide (DEMADEX) 100 MG tablet Take 100 mg by mouth 2 (two) times daily.         Allergies  Allergen Reactions  . Ace Inhibitors     REACTION: cough    Family History  Problem Relation Age of Onset  . Hypertension Mother   . Cancer Mother   . Cancer Father   . Diabetes Sister     History   Social History  . Marital Status: Married    Spouse Name: N/A    Number of Children: 4  . Years of Education: N/A   Occupational History  . 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 Smoking status: Smoker, Current Status Unknown -- 45 years    Types: Cigarettes  . Smokeless tobacco: Current User   Comment: 1-2 cigarettes OF THE NEW  ELECTRIC CIGARETTES  . Alcohol Use: 0.5 oz/week    1 drink(s) per week     occ  . Drug Use: No  . Sexually Active: Not on file   Other Topics Concern  . Not on file   Social History Narrative  . No narrative on file    ROS ALL NEGATIVE EXCEPT THOSE NOTED IN HPI  PE  General Appearance: well developed, well nourished in no acute distress, obese HEENT: symmetrical face, PERRLA, good dentition  Neck: no JVD, thyromegaly, or adenopathy, trachea midline Chest: symmetric without deformity Cardiac: PMI non-displaced, RRR, normal S1, S2, no gallop or murmur Lung: clear to ausculation and percussion Vascular: all pulses full without bruits  Abdominal: nondistended, nontender, good bowel sounds, no HSM, no bruits Extremities: no cyanosis, clubbing or edema, no sign of DVT, no varicosities  Skin: normal color, no rashes Neuro: alert and oriented x 3, non-focal Pysch: normal affect  EKG  BMET    Component  Value Date/Time   NA 139 07/21/2011 0629   K 3.3* 07/21/2011 0629   CL 101 07/21/2011 0629   CO2 25 07/21/2011 0629   GLUCOSE 96 07/21/2011 0629   BUN 41* 07/21/2011 0629   CREATININE 3.58* 07/21/2011 0629   CREATININE 3.63* 07/12/2011 1815   CALCIUM 9.0 07/21/2011 0629   GFRNONAA 16* 07/21/2011 0629   GFRAA 19* 07/21/2011 0629    Lipid Panel     Component Value Date/Time   CHOL 171 05/01/2011 0055   TRIG 90 05/01/2011 0055   HDL 57 05/01/2011 0055   CHOLHDL 3.0 05/01/2011 0055   VLDL 18 05/01/2011 0055   LDLCALC 96 05/01/2011 0055    CBC    Component Value Date/Time   WBC 7.7 07/19/2011 0601   RBC 3.56* 07/19/2011 0601   HGB 11.1* 07/19/2011 0601   HCT 32.8* 07/19/2011 0601   PLT 214 07/19/2011 0601   MCV 92.1 07/19/2011 0601   MCH 31.2 07/19/2011 0601   MCHC 33.8 07/19/2011 0601   RDW 14.7 07/19/2011 0601   LYMPHSABS 1.4 07/19/2011 0601   MONOABS 0.6 07/19/2011 0601   EOSABS 0.3 07/19/2011 0601   BASOSABS 0.0 07/19/2011 0601

## 2011-08-10 ENCOUNTER — Encounter: Payer: Self-pay | Admitting: Family Medicine

## 2011-08-10 DIAGNOSIS — K8020 Calculus of gallbladder without cholecystitis without obstruction: Secondary | ICD-10-CM | POA: Insufficient documentation

## 2011-08-10 LAB — BASIC METABOLIC PANEL
BUN: 59 mg/dL — ABNORMAL HIGH (ref 6–23)
CO2: 30 mEq/L (ref 19–32)
Calcium: 9.6 mg/dL (ref 8.4–10.5)
Creat: 4.55 mg/dL — ABNORMAL HIGH (ref 0.50–1.35)
Glucose, Bld: 96 mg/dL (ref 70–99)

## 2011-08-10 NOTE — Assessment & Plan Note (Signed)
Controlled and followed by endo 

## 2011-08-10 NOTE — Assessment & Plan Note (Signed)
Pt symptomatic with weight loss

## 2011-08-10 NOTE — Assessment & Plan Note (Signed)
Decompensated, symptoms consistent with decompensation, will obtain labs  And follow up closely

## 2011-08-10 NOTE — Assessment & Plan Note (Signed)
Uncontrolled, dose of clonidine is to be increeased

## 2011-08-10 NOTE — Assessment & Plan Note (Signed)
Symptomatic gallstones, will refer to surgery for further eval. May need cholecystectomy

## 2011-08-12 ENCOUNTER — Ambulatory Visit (INDEPENDENT_AMBULATORY_CARE_PROVIDER_SITE_OTHER): Payer: Medicare Other | Admitting: Cardiology

## 2011-08-12 ENCOUNTER — Encounter: Payer: Self-pay | Admitting: Cardiology

## 2011-08-12 VITALS — BP 117/47 | HR 65 | Ht 70.0 in | Wt 229.0 lb

## 2011-08-12 DIAGNOSIS — I340 Nonrheumatic mitral (valve) insufficiency: Secondary | ICD-10-CM

## 2011-08-12 DIAGNOSIS — I059 Rheumatic mitral valve disease, unspecified: Secondary | ICD-10-CM

## 2011-08-12 DIAGNOSIS — E860 Dehydration: Secondary | ICD-10-CM

## 2011-08-12 DIAGNOSIS — N184 Chronic kidney disease, stage 4 (severe): Secondary | ICD-10-CM

## 2011-08-12 DIAGNOSIS — I359 Nonrheumatic aortic valve disorder, unspecified: Secondary | ICD-10-CM

## 2011-08-12 DIAGNOSIS — R55 Syncope and collapse: Secondary | ICD-10-CM

## 2011-08-12 DIAGNOSIS — I509 Heart failure, unspecified: Secondary | ICD-10-CM

## 2011-08-12 DIAGNOSIS — I5022 Chronic systolic (congestive) heart failure: Secondary | ICD-10-CM

## 2011-08-12 NOTE — Assessment & Plan Note (Signed)
See recommendations under syncope.

## 2011-08-12 NOTE — Progress Notes (Signed)
HPI [name] comes in for close followup of his chronic diastolic heart failure, significant weight fluctuations, orthostatic hypotension, syncope earlier this week probably from dehydration, and chronic kidney disease. Blood work earlier this week showed signs of worsening renal function consistent with intravascular volume depletion.  His weight has actually dropped 3 pounds on our scale since Monday. He cut his labetalol back and he  feels better. He is not taking any metolazone. He still gets a little lightheaded when he stands up. He has not had recurrent syncope.  He takes torsemide twice a day. He notices a significant amount of urine output at night.  He denies orthopnea, PND or edema.  He is tightening up on his calories so he thinks some of his weight loss is from that.  Past Medical History  Diagnosis Date  . Congestive heart failure 10/2008    preserved LV systolic function   . Aortic insufficiency 09/2008    Normal left ventricular size and function : graded as mild by Doppler , but pulse pressure is widened  . 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 Hypertension   . 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 Erectile dysfunction   . Colonic polyp   . Low serum testosterone level   . Tobacco abuse   . Sleep apnea 2010    Initially unable to afford CPAP  . Benign prostatic hypertrophy   . Obesity, Class I, BMI 30-34.9   . Hyperlipidemia   . Congestive heart failure (CHF)     EF 25-30% 07/2011  . Adenomatous colon polyp 2006    Current Outpatient Prescriptions  Medication Sig Dispense Refill  . aspirin (ASPIRIN LOW DOSE) 81 MG EC tablet Take 81 mg by mouth daily.        . calcitRIOL (ROCALTROL) 0.25 MCG capsule Take 0.25 mcg by mouth daily.        . cloNIDine (CATAPRES) 0.3 MG tablet Take 0.3 mg by mouth 2 (two) times daily.      . hydrALAZINE (APRESOLINE) 25 MG tablet Take 1  tablet (25 mg total) by mouth 3 (three) times daily.  270 tablet  3  . insulin glargine (LANTUS) 100 UNIT/ML injection Inject 30 Units into the skin at bedtime.      Marland Kitchen labetalol (NORMODYNE) 300 MG tablet Take 300 mg by mouth 2 (two) times daily.      . potassium chloride SA (K-DUR,KLOR-CON) 20 MEQ tablet Take 20 mEq by mouth daily.      Marland Kitchen torsemide (DEMADEX) 100 MG tablet Take 100 mg by mouth 2 (two) times daily.       Marland Kitchen DISCONTD: labetalol (NORMODYNE) 300 MG tablet Take 2 tablets (600 mg total) by mouth 2 (two) times daily.  120 tablet  6  . metolazone (ZAROXOLYN) 2.5 MG tablet Take 1 tablet (2.5 mg total) by mouth daily as needed (swelling, shortness of breath).  30 tablet  0    Allergies  Allergen Reactions  . Ace Inhibitors     REACTION: cough    Family History  Problem Relation Age of Onset  . Hypertension Mother   . Cancer Mother   . Cancer Father   . Diabetes Sister     History   Social History  . Marital Status: Married    Spouse Name: N/A    Number of Children: 4  . Years  of Education: N/A   Occupational History  . 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 Smoking status: Current Some Day Smoker -- 45 years    Types: Cigarettes  . Smokeless tobacco: Current User   Comment: 1-2 cigarettes OF THE NEW ELECTRIC CIGARETTES  . Alcohol Use: 0.5 oz/week    1 drink(s) per week     occ  . Drug Use: No  . Sexually Active: Not on file   Other Topics Concern  . Not on file   Social History Narrative  . No narrative on file    ROS ALL NEGATIVE EXCEPT THOSE NOTED IN HPI  PE  General Appearance: well developed, well nourished in no acute distress, overweight HEENT: symmetrical face, PERRLA, good dentition  Neck: no JVD, thyromegaly, or adenopathy, trachea midline Chest: symmetric without deformity Cardiac: PMI non-displaced, RRR, normal S1, S2, no gallop or murmur Lung: clear to ausculation and percussion Vascular: all pulses full without  bruits  Abdominal: nondistended, nontender, good bowel sounds, no HSM, no bruits Extremities: no cyanosis, clubbing or edema, no sign of DVT, no varicosities  Skin: normal color, no rashes Neuro: alert and oriented x 3, non-focal Pysch: normal affect  EKG  BMET    Component Value Date/Time   NA 137 08/10/2011 1143   K 3.5 08/10/2011 1143   CL 95* 08/10/2011 1143   CO2 30 08/10/2011 1143   GLUCOSE 96 08/10/2011 1143   BUN 59* 08/10/2011 1143   CREATININE 4.55* 08/10/2011 1143   CREATININE 3.58* 07/21/2011 0629   CALCIUM 9.6 08/10/2011 1143   GFRNONAA 16* 07/21/2011 0629   GFRAA 19* 07/21/2011 0629    Lipid Panel     Component Value Date/Time   CHOL 171 05/01/2011 0055   TRIG 90 05/01/2011 0055   HDL 57 05/01/2011 0055   CHOLHDL 3.0 05/01/2011 0055   VLDL 18 05/01/2011 0055   LDLCALC 96 05/01/2011 0055    CBC    Component Value Date/Time   WBC 7.7 07/19/2011 0601   RBC 3.56* 07/19/2011 0601   HGB 11.1* 07/19/2011 0601   HCT 32.8* 07/19/2011 0601   PLT 214 07/19/2011 0601   MCV 92.1 07/19/2011 0601   MCH 31.2 07/19/2011 0601   MCHC 33.8 07/19/2011 0601   RDW 14.7 07/19/2011 0601   LYMPHSABS 1.4 07/19/2011 0601   MONOABS 0.6 07/19/2011 0601   EOSABS 0.3 07/19/2011 0601   BASOSABS 0.0 07/19/2011 0601

## 2011-08-12 NOTE — Assessment & Plan Note (Signed)
No recurrence. He is still mildly orthostatic but his heart rate increases with the lower dose of labetalol. I still think he severely volume depleted. I decreased his torsemide to once a day with careful watching daily weights. If he becomes short of breath, developed swelling, or his weight exceeds greater than 3 pounds, he will go back to twice a day torsemide at that time. While he is taking it once a day we have cut his potassium to once a day. If he takes it twice a day he will take 2 potassiums. I'll see him back for close followup in about 3 weeks. Orthostatic precautions given.

## 2011-08-12 NOTE — Patient Instructions (Signed)
Your physician has recommended you make the following change in your medication:  Decrease Torsemide to once a day Decrease Potassium to once a day.  Your physician recommends that you weigh, daily, at the same time every day, and in the same amount of clothing. Please record your daily weights on the handout provided and bring it to your next appointment. IF YOUR WEIGHT GOES UP BY 3 POUNDS OVERNIGHT OR YOU HAVE SHORTNESS OF BREATH TAKE YOUR EXTRA TORSEMIDE  THAT AFTERNOON AND POTASSIUM  Your physician recommends that you schedule a follow-up appointment in: August 13,2013 with Dr. Daleen Squibb.

## 2011-08-14 DIAGNOSIS — N184 Chronic kidney disease, stage 4 (severe): Secondary | ICD-10-CM | POA: Insufficient documentation

## 2011-08-14 NOTE — Assessment & Plan Note (Signed)
Marked improvement in symptoms since most recent hospitalization, needs close follow up with cardiology

## 2011-08-14 NOTE — Assessment & Plan Note (Signed)
suboptimal control as far as systolic pressure is concerned, however pt very symptomatic and intolerant of bidil, will leave med adjustment to cardiology, of note diastolic pressure is low

## 2011-08-14 NOTE — Assessment & Plan Note (Signed)
  Managing his heart and kidney failure continues to be a challenge.  Pt followed by nephrology.

## 2011-08-14 NOTE — Assessment & Plan Note (Signed)
Controlled and followed by endo 

## 2011-08-14 NOTE — Assessment & Plan Note (Signed)
Hyperlipidemia:Low fat diet discussed and encouraged.  Controlled, no change in medication   

## 2011-08-15 ENCOUNTER — Telehealth: Payer: Self-pay | Admitting: Cardiology

## 2011-08-15 NOTE — Telephone Encounter (Signed)
PT WANTS TO KNOW IF HE CAN GO BACK TO WORK

## 2011-08-15 NOTE — Telephone Encounter (Signed)
lmtcb Debbie Joseh Sjogren RN  

## 2011-08-16 ENCOUNTER — Telehealth: Payer: Self-pay | Admitting: Cardiology

## 2011-08-16 NOTE — Telephone Encounter (Signed)
Pt walked in office to find out if he can go back to work or not. Please call him and leave on v/m if not answer

## 2011-08-18 NOTE — Telephone Encounter (Signed)
Patient states he would like to go back to work asap without restrictions, if possible.  I advised him we would try to facilitate this for him.  Please let me know if you need me to assist with this.

## 2011-08-19 ENCOUNTER — Encounter (HOSPITAL_COMMUNITY): Payer: Self-pay | Admitting: Pharmacy Technician

## 2011-08-19 NOTE — Telephone Encounter (Signed)
Patient called inquiring about returning to work.  Has been in office twice this week asking for a letter.

## 2011-08-19 NOTE — Telephone Encounter (Signed)
I have reviewed the chart. Dr. wall one and see him within 3 weeks from the last office visit. He was worried about him being dehydrated secondary to being lightheaded and dizzy. He adjusted his medications down because he thought it would be helpful. He can return to work if he is asymptomatic. Depends on what kind of job he does. If it requires him to be on his feet for long periods of time and  he cannot sit down due to dizziness, or if he is outside in the sun a lot and easily dehydrated he will have to wait until being seen by Dr. Daleen Squibb on the scheduled appointment.   If he insists on returning to work and is asymptomatic, provide him with a letter with stipulations stating that he cannot  stand for prolonged periods of time or be in an environment where he is unable to take breaks or be in the shade. Orthostatic blood pressures are recommended. He is to keep his upon with Dr. Daleen Squibb

## 2011-08-22 NOTE — Telephone Encounter (Signed)
Patient states that he is willing to wait to see Dr Daleen Squibb on 8/20.  Denies previous symptoms.  Offered him an earlier appointment with Samara Deist, however he states that he would prefer to wait and see Dr Daleen Squibb.

## 2011-08-23 NOTE — Telephone Encounter (Signed)
Ok thanks 

## 2011-08-26 ENCOUNTER — Encounter: Payer: Self-pay | Admitting: *Deleted

## 2011-08-26 ENCOUNTER — Telehealth: Payer: Self-pay | Admitting: Cardiology

## 2011-08-26 NOTE — Telephone Encounter (Signed)
Pt aware he is cleared to return to work. Letter sent to pt. Mylo Red RN

## 2011-08-26 NOTE — Telephone Encounter (Signed)
LMTCB Debbie Norely Schlick RN  

## 2011-08-30 NOTE — Telephone Encounter (Signed)
Close  

## 2011-08-31 MED ORDER — SODIUM CHLORIDE 0.45 % IV SOLN
Freq: Once | INTRAVENOUS | Status: AC
  Administered 2011-09-01: 1000 mL via INTRAVENOUS

## 2011-09-01 ENCOUNTER — Encounter (HOSPITAL_COMMUNITY)
Admission: RE | Disposition: A | Discharge status: Home / Self Care | Payer: Self-pay | Source: Ambulatory Visit | Attending: Internal Medicine

## 2011-09-01 ENCOUNTER — Encounter (HOSPITAL_COMMUNITY): Payer: Self-pay | Admitting: *Deleted

## 2011-09-01 ENCOUNTER — Telehealth: Payer: Self-pay | Admitting: Internal Medicine

## 2011-09-01 ENCOUNTER — Other Ambulatory Visit: Payer: Self-pay | Admitting: Internal Medicine

## 2011-09-01 ENCOUNTER — Ambulatory Visit (HOSPITAL_COMMUNITY)
Admission: RE | Admit: 2011-09-01 | Discharge: 2011-09-01 | Disposition: A | Discharge status: Home / Self Care | Payer: Medicare Other | Source: Ambulatory Visit | Attending: Internal Medicine | Admitting: Internal Medicine

## 2011-09-01 DIAGNOSIS — I10 Essential (primary) hypertension: Secondary | ICD-10-CM | POA: Insufficient documentation

## 2011-09-01 DIAGNOSIS — E669 Obesity, unspecified: Secondary | ICD-10-CM | POA: Insufficient documentation

## 2011-09-01 DIAGNOSIS — Z860100 Personal history of colon polyps, unspecified: Secondary | ICD-10-CM | POA: Insufficient documentation

## 2011-09-01 DIAGNOSIS — Z6834 Body mass index (BMI) 34.0-34.9, adult: Secondary | ICD-10-CM | POA: Insufficient documentation

## 2011-09-01 DIAGNOSIS — D126 Benign neoplasm of colon, unspecified: Secondary | ICD-10-CM | POA: Insufficient documentation

## 2011-09-01 DIAGNOSIS — E785 Hyperlipidemia, unspecified: Secondary | ICD-10-CM | POA: Insufficient documentation

## 2011-09-01 DIAGNOSIS — G4733 Obstructive sleep apnea (adult) (pediatric): Secondary | ICD-10-CM | POA: Insufficient documentation

## 2011-09-01 DIAGNOSIS — K5900 Constipation, unspecified: Secondary | ICD-10-CM

## 2011-09-01 DIAGNOSIS — R1310 Dysphagia, unspecified: Secondary | ICD-10-CM

## 2011-09-01 DIAGNOSIS — K922 Gastrointestinal hemorrhage, unspecified: Secondary | ICD-10-CM

## 2011-09-01 DIAGNOSIS — R630 Anorexia: Secondary | ICD-10-CM

## 2011-09-01 DIAGNOSIS — K2940 Chronic atrophic gastritis without bleeding: Secondary | ICD-10-CM | POA: Insufficient documentation

## 2011-09-01 DIAGNOSIS — R6881 Early satiety: Secondary | ICD-10-CM

## 2011-09-01 DIAGNOSIS — A048 Other specified bacterial intestinal infections: Secondary | ICD-10-CM | POA: Insufficient documentation

## 2011-09-01 DIAGNOSIS — K2960 Other gastritis without bleeding: Secondary | ICD-10-CM

## 2011-09-01 DIAGNOSIS — E119 Type 2 diabetes mellitus without complications: Secondary | ICD-10-CM | POA: Insufficient documentation

## 2011-09-01 DIAGNOSIS — Z01812 Encounter for preprocedural laboratory examination: Secondary | ICD-10-CM | POA: Insufficient documentation

## 2011-09-01 LAB — GLUCOSE, CAPILLARY: Glucose-Capillary: 94 mg/dL (ref 70–99)

## 2011-09-01 SURGERY — COLONOSCOPY WITH ESOPHAGOGASTRODUODENOSCOPY (EGD)
Anesthesia: Moderate Sedation

## 2011-09-01 MED ORDER — MIDAZOLAM HCL 5 MG/5ML IJ SOLN
INTRAMUSCULAR | Status: AC
  Filled 2011-09-01: qty 10

## 2011-09-01 MED ORDER — MEPERIDINE HCL 100 MG/ML IJ SOLN
INTRAMUSCULAR | Status: AC
  Filled 2011-09-01: qty 1

## 2011-09-01 MED ORDER — STERILE WATER FOR IRRIGATION IR SOLN
Status: DC | PRN
  Administered 2011-09-01: 10:00:00

## 2011-09-01 MED ORDER — MEPERIDINE HCL 100 MG/ML IJ SOLN
INTRAMUSCULAR | Status: DC | PRN
  Administered 2011-09-01 (×2): 25 mg via INTRAVENOUS
  Administered 2011-09-01: 50 mg via INTRAVENOUS

## 2011-09-01 MED ORDER — MIDAZOLAM HCL 5 MG/5ML IJ SOLN
INTRAMUSCULAR | Status: DC | PRN
  Administered 2011-09-01 (×2): 2 mg via INTRAVENOUS
  Administered 2011-09-01 (×2): 1 mg via INTRAVENOUS

## 2011-09-01 MED ORDER — BUTAMBEN-TETRACAINE-BENZOCAINE 2-2-14 % EX AERO
INHALATION_SPRAY | CUTANEOUS | Status: DC | PRN
  Administered 2011-09-01: 2 via TOPICAL

## 2011-09-01 NOTE — Op Note (Signed)
[facility] [address] [place], [place]  [address]  ENDOSCOPY PROCEDURE REPORT  PATIENT:  [name], [name]  MR#:  [id number] BIRTHDATE:  06/23/1945, 66 yrs. old  GENDER:  male  ENDOSCOPIST:  R. Roetta Sessions, MD Caleen Essex Referred by:  Syliva Overman, M.D.  PROCEDURE DATE:  09/01/2011 PROCEDURE:  EGD with gastric biopsy  INDICATIONS:   early satiety; anorexia  INFORMED CONSENT:   The risks, benefits, limitations, alternatives and imponderables have been discussed.  The potential for biopsy, esophogeal dilation, etc. have also been reviewed.  Questions have been answered.  All parties agreeable.  Please see the history and physical in the medical record for more information.  MEDICATIONS: Versed 4 mg IV and Demerol 75 mg IV in divided doses. Cetacaine spray.  DESCRIPTION OF PROCEDURE:   The [id number] ([id number]) endoscope was introduced through the mouth and advanced to the second portion of the duodenum without difficulty or limitations.  The mucosal surfaces were surveyed very carefully during advancement of the scope and upon withdrawal.  Retroflexion view of the proximal stomach and esophagogastric junction was performed.  <<PROCEDUREIMAGES>>  FINDINGS:  Normal-appearing esophagus. Stomach empty;  linear antral erosions. submucosal petechial hemorrhage present.       No ulcer or infiltrating process. Pylorus patent. The first and second portion of the duodenum appeared normal.  THERAPEUTIC / DIAGNOSTIC MANEUVERS PERFORMED:  Biopsies of abnormal gastric antrum taken for histologic study.  COMPLICATIONS:   None  IMPRESSION:  Linear erosions/gastric submucosal petechial hemorrhage in the antrum, somewhat reminiscent of GAVE-status post biopsy  RECOMMENDATIONS:  Followup on pathology. See colonoscopy report.  ______________________________ R. Roetta Sessions, MD Caleen Essex  CC:  n. eSIGNED:   R. Roetta Sessions at 09/01/2011 10:40 AM  Cornelious Bryant,  [phone]

## 2011-09-01 NOTE — Op Note (Signed)
[facility] [address] [place], [place]  [address]  COLONOSCOPY PROCEDURE REPORT  PATIENT:  Harold, [name]  MR#:  [id number] BIRTHDATE:  1945/12/21, 66 yrs. old  GENDER:  male ENDOSCOPIST:  R. Roetta Sessions, MD FACP [facility] REF. BY:              Syliva Overman, M.D. PROCEDURE DATE:  09/01/2011 PROCEDURE:  Colonoscopy with snare polypectomy and polyp ablation  INDICATIONS:  History colonic adenoma; constipation  INFORMED CONSENT:  The risks, benefits, alternatives and imponderables including but not limited to bleeding, perforation as well as the possibility of a missed lesion have been reviewed. The potential for biopsy, lesion removal, etc. have also been discussed.  Questions have been answered.  All parties agreeable. Please see the history and physical in the medical record for more information.  MEDICATIONS:  Versed 6 mg IV and Demerol 100 mg IV in divided doses  DESCRIPTION OF PROCEDURE:  After a digital rectal exam was performed, the [id number] ([id number]) colonoscope was advanced from the anus through the rectum and colon to the area of the cecum, ileocecal valve and appendiceal orifice.  The cecum was deeply intubated.  These structures were well-seen and photographed for the record.  From the level of the cecum and ileocecal valve, the scope was slowly and cautiously withdrawn.  The mucosal surfaces were carefully surveyed utilizing scope tip deflection to facilitate fold flattening as needed.  The scope was pulled down into the rectum where a thorough examination including retroflexion was performed. <<PROCEDUREIMAGES>>  FINDINGS: Suboptimal preparation. Normal rectum. Multiple diminutive polyps  in the distal sigmoid segment; (1) 4 mm pedunculated polyp at the hepatic flexure; remainder of colonic mucosa appeared normal.  THERAPEUTIC / DIAGNOSTIC MANEUVERS PERFORMED: Sigmoid polyps ablated with the tip of a hot snare cautery unit. The hepatic flexure  polyp was cold snare removed  COMPLICATIONS:  None  CECAL WITHDRAWAL TIME: 9 minutes  IMPRESSION: Colonic polyps-treated / removed as described above  RECOMMENDATIONS: Followup on pathology. Further recommendations to follow.  ______________________________ R. Roetta Sessions, MD Caleen Essex  CC:  Syliva Overman, M.D.  n. eSIGNED:   R. Roetta Sessions at 09/01/2011 11:08 AM  Cornelious Bryant, [phone]

## 2011-09-01 NOTE — H&P (View-Only) (Signed)
Primary Care Physician:  Syliva Overman, MD Primary Gastroenterologist:  Dr. Jena Gauss Pre-Procedure History & Physical: HPI:  [name] is a 66 y.o. male here for evaluation and sees of progressive constipation early satiety and anorexia. Patient states he's not hungry these days a lot of times the patient says food tastes like" paper". He is able to eat. He also complains of filling up early. No nausea or vomiting. Absolutely no abdominal pain. No reflux symptoms no odynophagia or dysphagia. Progressive constipation. No melena or hematochezia. Has to take prune juice to have a bowel movement. States he's lost 10-15 pounds recently but that was in relation to taking Zaroxolyn for what sounds like congestive heart failure. Had a colonic adenoma removed in 2006. Gallbladder remains in situ. His long-standing diabetes and chronic kidney disease. No prior evaluation of his upper GI tract.  Past Medical History  Diagnosis Date  . Congestive heart failure 10/2008    preserved LV systolic function   . Aortic insufficiency 09/2008    Normal left ventricular size and function : graded as mild by Doppler , but pulse pressure is widened  . 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 Hypertension   . 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 Erectile dysfunction   . Colonic polyp   . Low serum testosterone level   . Tobacco abuse   . Sleep apnea 2010    Initially unable to afford CPAP  . Benign prostatic hypertrophy   . Obesity, Class I, BMI 30-34.9   . Hyperlipidemia   . Congestive heart failure (CHF)     EF 25-30% 07/2011  . Adenomatous colon polyp 2006    Past Surgical History  Procedure Date  . Retinal laser procedure     diabetic retinopathy  . Lymph node biopsy     surgical exploration of neck-not entirely clear that this represented a lymph node biopsy  . Colonoscopy 08/23/2004    Dr. Elmer Ramp  rectum, diminutive polyp of the rectosigmoid removed, inflamed focally adenomatous polyp    Prior to Admission medications   Medication Sig Start Date End Date Taking? Authorizing Provider  aspirin (ASPIRIN LOW DOSE) 81 MG EC tablet Take 81 mg by mouth daily.     Yes Historical Provider, MD  calcitRIOL (ROCALTROL) 0.25 MCG capsule Take 0.25 mcg by mouth daily.     Yes Historical Provider, MD  cloNIDine (CATAPRES) 0.3 MG tablet Take 0.3 mg by mouth 2 (two) times daily. 07/12/11 07/11/12 Yes Kerri Perches, MD  hydrALAZINE (APRESOLINE) 25 MG tablet Take 37.5 mg by mouth 3 (three) times daily.  07/05/11  Yes Historical Provider, MD  insulin glargine (LANTUS) 100 UNIT/ML injection Inject 30 Units into the skin at bedtime.   Yes Historical Provider, MD  isosorbide-hydrALAZINE (BIDIL) 20-37.5 MG per tablet Take 1/2 tablet po TID 08/03/11  Yes Gaylord Shih, MD  labetalol (NORMODYNE) 300 MG tablet Take 2 tablets (600 mg total) by mouth 2 (two) times daily. 07/27/11  Yes Kathlen Brunswick, MD  metolazone (ZAROXOLYN) 2.5 MG tablet Take 1 tablet (2.5 mg total) by mouth daily as needed (swelling, shortness of breath). 07/21/11 07/20/12 Yes Erick Blinks, MD  potassium chloride SA (K-DUR,KLOR-CON) 20 MEQ tablet Take 20 mEq by mouth daily. 01/21/11  Yes Kathlen Brunswick, MD  torsemide (DEMADEX) 100 MG tablet Take 100 mg by mouth 2 (two)  times daily.    Yes Historical Provider, MD    Allergies as of 08/05/2011 - Review Complete 08/05/2011  Allergen Reaction Noted  . Ace inhibitors      Family History  Problem Relation Age of Onset  . Hypertension Mother   . Cancer Mother   . Cancer Father   . Diabetes Sister     History   Social History  . Marital Status: Married    Spouse Name: N/A    Number of Children: 4  . Years of Education: N/A   Occupational History  . 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 Smoking status: Smoker, Current Status Unknown -- 45 years    Types:  Cigarettes  . Smokeless tobacco: Never Used   Comment: 1-2 cigarettes  . Alcohol Use: 0.5 oz/week    1 drink(s) per week     occ  . Drug Use: No  . Sexually Active: Not on file   Other Topics Concern  . Not on file   Social History Narrative  . No narrative on file    Review of Systems: See HPI, otherwise negative ROS  Physical Exam: BP 151/52  Pulse 72  Temp 97.6 F (36.4 C) (Temporal)  Ht 5\' 10"  (1.778 m)  Wt 234 lb 9.6 oz (106.414 kg)  BMI 33.66 kg/m2 General:   Alert,  Well-developed, well-nourished, pleasant and cooperative in NAD Skin:  Intact without significant lesions or rashes. Eyes:  Sclera clear, no icterus.   Conjunctiva pink. Ears:  Normal auditory acuity. Nose:  No deformity, discharge,  or lesions. Mouth:  No deformity or lesions. Neck:  Supple; no masses or thyromegaly. No significant cervical adenopathy. Lungs:  Clear throughout to auscultation.   No wheezes, crackles, or rhonchi. No acute distress. Heart:  Regular rate and rhythm; no murmurs, clicks, rubs,  or gallops. Abdomen: Non-distended, normal bowel sounds.  Soft and nontender . He does have a succussion splash but just ate some corn flakes as he reports;without appreciable mass or hepatosplenomegaly.  Pulses:  Normal pulses noted. Extremities:  Without clubbing or edema.  Impression/Plan:  Pleasant 66 year old gentleman multiple medical problems now with early satiety and some degree of anorexia along with progressing constipation. His symptoms are nonspecific. He does not have any alarm symptoms although early satiety warrants further evaluation. Could have underlying diabetic gastroparesis. I really don't get the sense that he has any symptoms emanating from his gallbladder. Progressive constipation may or may not be related to his early satiety symptoms. History of colonic adenoma.  Recommendations: Diagnostic EGD followed by screening colonoscopy in the near future at the hospital. The risks,  benefits, limitations, imponderables and alternatives regarding both EGD and colonoscopy have been reviewed with the patient. Questions have been answered. All parties agreeable.

## 2011-09-01 NOTE — Telephone Encounter (Signed)
Patient is scheduled for [facility] on Thursday Aug 22nd at 7:45 and patient is aware

## 2011-09-01 NOTE — Interval H&P Note (Signed)
History and Physical Interval Note:  09/01/2011 10:12 AM  [name]  has presented today for surgery, with the diagnosis of HISTORY OF COLON POLYPS AND DYSPHAGIA  The various methods of treatment have been discussed with the patient and family. After consideration of risks, benefits and other options for treatment, the patient has consented to  Procedure(s) (LRB): COLONOSCOPY WITH ESOPHAGOGASTRODUODENOSCOPY (EGD) (N/A) as a surgical intervention .  The patient's history has been reviewed, patient examined, no change in status, stable for surgery.  I have reviewed the patient's chart and labs.  Questions were answered to the patient's satisfaction.     Eula Listen

## 2011-09-02 ENCOUNTER — Encounter: Payer: Self-pay | Admitting: Internal Medicine

## 2011-09-05 ENCOUNTER — Encounter: Payer: Self-pay | Admitting: *Deleted

## 2011-09-05 NOTE — Progress Notes (Unsigned)
  Pt is aware, letters have been mailed to pt. rx called to [facility] pharmacy.  Dawn, please cc pcp    Per RMR- Letter from: Corbin Ade   Reason for Letter: Results Review   Send letter to patient.  Send copy of letter with path to referring provider and PCP.   To Raynelle Fanning:  Pt needs prevpak or generic equivalent x 14 days

## 2011-09-05 NOTE — Progress Notes (Signed)
Already sent to PCP, letters and path

## 2011-09-06 ENCOUNTER — Encounter: Payer: Self-pay | Admitting: Cardiology

## 2011-09-06 ENCOUNTER — Ambulatory Visit (INDEPENDENT_AMBULATORY_CARE_PROVIDER_SITE_OTHER): Payer: Medicare Other | Admitting: Cardiology

## 2011-09-06 VITALS — BP 154/52 | HR 68 | Ht 70.0 in | Wt 232.1 lb

## 2011-09-06 DIAGNOSIS — R609 Edema, unspecified: Secondary | ICD-10-CM

## 2011-09-06 DIAGNOSIS — F17200 Nicotine dependence, unspecified, uncomplicated: Secondary | ICD-10-CM

## 2011-09-06 DIAGNOSIS — N184 Chronic kidney disease, stage 4 (severe): Secondary | ICD-10-CM

## 2011-09-06 DIAGNOSIS — E785 Hyperlipidemia, unspecified: Secondary | ICD-10-CM

## 2011-09-06 DIAGNOSIS — I509 Heart failure, unspecified: Secondary | ICD-10-CM

## 2011-09-06 DIAGNOSIS — G4730 Sleep apnea, unspecified: Secondary | ICD-10-CM

## 2011-09-06 DIAGNOSIS — I5022 Chronic systolic (congestive) heart failure: Secondary | ICD-10-CM

## 2011-09-06 DIAGNOSIS — I059 Rheumatic mitral valve disease, unspecified: Secondary | ICD-10-CM

## 2011-09-06 DIAGNOSIS — I359 Nonrheumatic aortic valve disorder, unspecified: Secondary | ICD-10-CM

## 2011-09-06 DIAGNOSIS — I340 Nonrheumatic mitral (valve) insufficiency: Secondary | ICD-10-CM

## 2011-09-06 DIAGNOSIS — I10 Essential (primary) hypertension: Secondary | ICD-10-CM

## 2011-09-06 DIAGNOSIS — J449 Chronic obstructive pulmonary disease, unspecified: Secondary | ICD-10-CM

## 2011-09-06 NOTE — Progress Notes (Signed)
HPI [name] returns for close followup of his chronic systolic heart failure. He has had severe problems with orthostatic hypotension and syncope. Please see my previous notes.  He is feeling better than he has in some time. He denies any recurrent syncope or presyncope. He likes start driving again.  He seems to be compliant with his medications.  Past Medical History  Diagnosis Date  . Congestive heart failure 10/2008    preserved LV systolic function   . Aortic insufficiency 09/2008    Normal left ventricular size and function : graded as mild by Doppler , but pulse pressure is widened  . 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 Hypertension   . 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 Erectile dysfunction   . Colonic polyp   . Low serum testosterone level   . Tobacco abuse   . Sleep apnea 2010    Initially unable to afford CPAP  . Benign prostatic hypertrophy   . Obesity, Class I, BMI 30-34.9   . Hyperlipidemia   . Congestive heart failure (CHF)     EF 25-30% 07/2011  . Adenomatous colon polyp 2006  . H. pylori infection 2013    treated with prevpac    Current Outpatient Prescriptions  Medication Sig Dispense Refill  . aspirin (ASPIRIN LOW DOSE) 81 MG EC tablet Take 81 mg by mouth daily.        . calcitRIOL (ROCALTROL) 0.25 MCG capsule Take 0.25 mcg by mouth daily.        . cloNIDine (CATAPRES) 0.3 MG tablet Take 0.3 mg by mouth 2 (two) times daily.      Marland Kitchen DIOVAN 160 MG tablet Take 160 mg by mouth daily.       . hydrALAZINE (APRESOLINE) 25 MG tablet Take 25 mg by mouth 2 (two) times daily.      . insulin glargine (LANTUS) 100 UNIT/ML injection Inject 30 Units into the skin at bedtime.      Marland Kitchen labetalol (NORMODYNE) 300 MG tablet Take 300 mg by mouth 2 (two) times daily.      . lansoprazole (PREVACID) 30 MG capsule Take 30 mg by mouth daily.       . metolazone (ZAROXOLYN) 2.5 MG tablet  Take 2.5 mg by mouth daily as needed. Swelling/Shortness of Breath      . torsemide (DEMADEX) 100 MG tablet Take 100 mg by mouth 2 (two) times daily.       Marland Kitchen amoxicillin (AMOXIL) 500 MG capsule       . clarithromycin (BIAXIN) 500 MG tablet         Allergies  Allergen Reactions  . Ace Inhibitors     REACTION: cough    Family History  Problem Relation Age of Onset  . Hypertension Mother   . Cancer Mother   . Cancer Father   . Diabetes Sister     History   Social History  . Marital Status: Married    Spouse Name: N/A    Number of Children: 4  . Years of Education: N/A   Occupational History  . 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 Smoking status: Current Some Day Smoker -- 45 years    Types: Cigarettes  . Smokeless tobacco: Current User   Comment: 1-2 cigarettes OF THE NEW ELECTRIC CIGARETTES  . Alcohol  Use: 0.5 oz/week    1 drink(s) per week     occ  . Drug Use: No  . Sexually Active: Not on file   Other Topics Concern  . Not on file   Social History Narrative  . No narrative on file    ROS ALL NEGATIVE EXCEPT THOSE NOTED IN HPI  PE  General Appearance: well developed, well nourished in no acute distress HEENT: symmetrical face, PERRLA, good dentition  Neck: no JVD, thyromegaly, or adenopathy, trachea midline Chest: symmetric without deformity Cardiac: PMI non-displaced, RRR, normal S1, S2, systolic murmur at the apex, 3 were 6 diastolic murmur left upper sternal border Lung: clear to ausculation and percussion Vascular: all pulses full without bruits  Abdominal: nondistended, nontender, good bowel sounds, no HSM, no bruits Extremities: no cyanosis, clubbing or edema, no sign of DVT, no varicosities  Skin: normal color, no rashes Neuro: alert and oriented x 3, non-focal Pysch: normal affect  EKG  BMET    Component Value Date/Time   NA 137 08/10/2011 1143   K 3.5 08/10/2011 1143   CL 95* 08/10/2011 1143   CO2 30 08/10/2011 1143    GLUCOSE 96 08/10/2011 1143   BUN 59* 08/10/2011 1143   CREATININE 4.55* 08/10/2011 1143   CREATININE 3.58* 07/21/2011 0629   CALCIUM 9.6 08/10/2011 1143   GFRNONAA 16* 07/21/2011 0629   GFRAA 19* 07/21/2011 0629    Lipid Panel     Component Value Date/Time   CHOL 171 05/01/2011 0055   TRIG 90 05/01/2011 0055   HDL 57 05/01/2011 0055   CHOLHDL 3.0 05/01/2011 0055   VLDL 18 05/01/2011 0055   LDLCALC 96 05/01/2011 0055    CBC    Component Value Date/Time   WBC 7.7 07/19/2011 0601   RBC 3.56* 07/19/2011 0601   HGB 11.1* 07/19/2011 0601   HCT 32.8* 07/19/2011 0601   PLT 214 07/19/2011 0601   MCV 92.1 07/19/2011 0601   MCH 31.2 07/19/2011 0601   MCHC 33.8 07/19/2011 0601   RDW 14.7 07/19/2011 0601   LYMPHSABS 1.4 07/19/2011 0601   MONOABS 0.6 07/19/2011 0601   EOSABS 0.3 07/19/2011 0601   BASOSABS 0.0 07/19/2011 0601

## 2011-09-06 NOTE — Patient Instructions (Addendum)
Your physician recommends that you schedule a follow-up appointment in: 3 month follow up  

## 2011-09-06 NOTE — Assessment & Plan Note (Addendum)
Still elevated but not symptomatic with orthostasis. We'll not push meds further.

## 2011-09-06 NOTE — Assessment & Plan Note (Signed)
Much improved

## 2011-09-06 NOTE — Assessment & Plan Note (Signed)
He is finally stabilized I'll and appears to be euvolemic. He is no longer orthostatic as had no presyncope or syncope. No change in medications today. Congestive heart failure protocol reviewed. Patient again instructed to take metolazone if his weight jumps up more than 3 pounds. I'll see him back again in about 3 months.

## 2011-09-07 ENCOUNTER — Ambulatory Visit (INDEPENDENT_AMBULATORY_CARE_PROVIDER_SITE_OTHER): Payer: Medicare Other | Admitting: Surgery

## 2011-09-07 ENCOUNTER — Telehealth: Payer: Self-pay | Admitting: *Deleted

## 2011-09-07 NOTE — Telephone Encounter (Signed)
[name] called today regarding his copays for his medications. He cannot afford it and would like to know if there is anyway we can help him. Please call him back. Thanks.

## 2011-09-08 ENCOUNTER — Encounter (HOSPITAL_COMMUNITY)
Admission: RE | Admit: 2011-09-08 | Discharge: 2011-09-08 | Disposition: A | Discharge status: Home / Self Care | Payer: Medicare Other | Source: Ambulatory Visit | Attending: Internal Medicine | Admitting: Internal Medicine

## 2011-09-08 DIAGNOSIS — R6881 Early satiety: Secondary | ICD-10-CM | POA: Insufficient documentation

## 2011-09-08 DIAGNOSIS — R630 Anorexia: Secondary | ICD-10-CM | POA: Insufficient documentation

## 2011-09-08 MED ORDER — TECHNETIUM TC 99M SULFUR COLLOID
2.0000 | Freq: Once | INTRAVENOUS | Status: AC | PRN
  Administered 2011-09-08: 2 via INTRAVENOUS

## 2011-09-08 NOTE — Telephone Encounter (Signed)
I think our best option if [employer] voucher. Let's track down rep and get some.   Just in case, see how much the following would cost. Tetracycline 500mg  QID X 14 days. Metronidazole 250mg  QID for 14 days.

## 2011-09-08 NOTE — Telephone Encounter (Signed)
PERFECT.

## 2011-09-08 NOTE — Telephone Encounter (Signed)
I got message from rep. He is coming in next Tuesday 8/27 and will bring vouchers for pt.

## 2011-09-08 NOTE — Telephone Encounter (Signed)
Spoke with pts pharmacy, pt is in the doughnut hole and is unable to afford the prevpac. They tried splitting it up into generics and it was still over 100.00, (biaxin was $50, prevacid was $50 and amox was $14) pharmacist said he could get him protonix for $10 but the biaxin was still too expensive for pt. We do not have any [employer] vouchers and we have sent a message to the rep to see if we can get some. Pt has medicare so he cannot use copay cards. Pt is only allergic to ace inhibitors. Please advise if there is anything else we can do

## 2011-09-08 NOTE — Telephone Encounter (Signed)
Pt is aware and will come by next week to pick up rx and vouchers.

## 2011-09-08 NOTE — Telephone Encounter (Signed)
Please get more information.

## 2011-09-12 ENCOUNTER — Ambulatory Visit: Payer: Medicare Other | Admitting: Family Medicine

## 2011-09-13 ENCOUNTER — Other Ambulatory Visit: Payer: Self-pay | Admitting: Internal Medicine

## 2011-09-13 MED ORDER — BIS SUBCIT-METRONID-TETRACYC 140-125-125 MG PO CAPS: 3.0000 | ORAL_CAPSULE | Freq: Three times a day (TID) | ORAL | Status: DC

## 2011-09-14 ENCOUNTER — Encounter: Payer: Medicare Other | Admitting: Family Medicine

## 2011-11-29 ENCOUNTER — Ambulatory Visit: Payer: Medicare Other | Admitting: Cardiology

## 2012-01-04 ENCOUNTER — Ambulatory Visit: Payer: Self-pay | Admitting: Cardiology

## 2012-02-01 ENCOUNTER — Ambulatory Visit (INDEPENDENT_AMBULATORY_CARE_PROVIDER_SITE_OTHER): Payer: Medicare Other | Admitting: Cardiology

## 2012-02-01 ENCOUNTER — Encounter: Payer: Self-pay | Admitting: Cardiology

## 2012-02-01 VITALS — BP 130/64 | HR 67 | Ht 70.5 in | Wt 227.0 lb

## 2012-02-01 DIAGNOSIS — N184 Chronic kidney disease, stage 4 (severe): Secondary | ICD-10-CM

## 2012-02-01 DIAGNOSIS — E1139 Type 2 diabetes mellitus with other diabetic ophthalmic complication: Secondary | ICD-10-CM

## 2012-02-01 DIAGNOSIS — I10 Essential (primary) hypertension: Secondary | ICD-10-CM

## 2012-02-01 DIAGNOSIS — I5022 Chronic systolic (congestive) heart failure: Secondary | ICD-10-CM

## 2012-02-01 DIAGNOSIS — I359 Nonrheumatic aortic valve disorder, unspecified: Secondary | ICD-10-CM

## 2012-02-01 NOTE — Assessment & Plan Note (Signed)
Patient continues to have trouble with elevated sugars in the morning. Have asked him to follow up with his endocrinologist.

## 2012-02-01 NOTE — Assessment & Plan Note (Signed)
[name] heart failure is well compensated. He did take extra Zaroxolyn on Saturday for weight gain but manages it well.

## 2012-02-01 NOTE — Patient Instructions (Addendum)
Your physician recommends that you schedule a follow-up appointment in: July  Your physician has requested that you have an echocardiogram in July, prior to your follow up visit.. Echocardiography is a painless test that uses sound waves to create images of your heart. It provides your doctor with information about the size and shape of your heart and how well your heart's chambers and valves are working. This procedure takes approximately one hour. There are no restrictions for this procedure.  Your physician recommends that you return for lab work in: Today

## 2012-02-01 NOTE — Assessment & Plan Note (Signed)
Controlled.  

## 2012-02-01 NOTE — Progress Notes (Signed)
HPI:  This is a 67 year old African American male patient Dr. Elijah Birk wall who has history of chronic systolic and diastolic heart failure.He also had a history of orthostatic hypotension and syncope due to dehydration and chronic kidney disease. His last 2-D echo was in July 2013 his EF was 25-30% and he had mild to moderate AI as well as grade 1 diastolic dysfunction.  The patient has been managing his fluid status pretty well. He had take Zaroxolyn on Saturday because his weight jumped up a few pounds but he's been able to manage this well. He denies any chest pain, palpitations, dyspnea, dyspnea on exertion, edema, dizziness, or presyncope. He says occasionally he'll hear his heart beating in his left ear. He is also having trouble with his sugars in the morning since his insulin was decreased by his endocrinologist.  Allergies: -- Ace Inhibitors    --  REACTION: cough  Current Outpatient Prescriptions on File Prior to Visit: aspirin (ASPIRIN LOW DOSE) 81 MG EC tablet, Take 81 mg by mouth daily.  , Disp: , Rfl:  calcitRIOL (ROCALTROL) 0.25 MCG capsule, Take 0.25 mcg by mouth daily.  , Disp: , Rfl:  cloNIDine (CATAPRES) 0.3 MG tablet, Take 0.3 mg by mouth 2 (two) times daily., Disp: , Rfl:  DIOVAN 160 MG tablet, Take 160 mg by mouth daily. , Disp: , Rfl:  hydrALAZINE (APRESOLINE) 25 MG tablet, Take 25 mg by mouth 2 (two) times daily., Disp: , Rfl:  insulin glargine (LANTUS) 100 UNIT/ML injection, Inject 10 Units into the skin at bedtime. , Disp: , Rfl:  linagliptin (TRADJENTA) 5 MG TABS tablet, Take 5 mg by mouth daily., Disp: , Rfl:  metolazone (ZAROXOLYN) 2.5 MG tablet, Take 2.5 mg by mouth daily as needed. Swelling/Shortness of Breath , Disp: , Rfl:  torsemide (DEMADEX) 100 MG tablet, Take 100 mg by mouth 2 (two) times daily. , Disp: , Rfl:  bismuth-metronidazole-tetracycline (PYLERA) 140-125-125 MG per capsule, Take 3 capsules by mouth 4 (four) times daily -  before meals and at bedtime., Disp:  24 capsule, Rfl: 5    Past Medical History:   Congestive heart failure                        10/2008        Comment:preserved LV systolic function    Aortic insufficiency                            09/2008         Comment:Normal left ventricular size and function :               graded as mild by Doppler , but pulse pressure               is widened   Diabetes mellitus type II                                      Comment:with retiopathy and nephropathy    Hypertension                                                 Chronic kidney disease (CKD), stage IV (severe) 12/2009  Comment:Creatinine of 2.2 in 3/11 and 2.7 in 8/12   Erectile dysfunction                                         Colonic polyp                                                Low serum testosterone level                                 Tobacco abuse                                                Sleep apnea                                     2010           Comment:Initially unable to afford CPAP   Benign prostatic hypertrophy                                 Obesity, Class I, BMI 30-34.9                                Hyperlipidemia                                               Congestive heart failure (CHF)                                 Comment:EF 25-30% 07/2011   Adenomatous colon polyp                         2006         H. pylori infection                             2013           Comment:treated with prevpac  Past Surgical History:   RETINAL LASER PROCEDURE                                        Comment:diabetic retinopathy   LYMPH NODE BIOPSY                                              Comment:surgical exploration of neck-not entirely clear  that this represented a lymph node biopsy   COLONOSCOPY                                     08/23/2004       Comment:Dr. Rourk-normal rectum, diminutive polyp of               the rectosigmoid removed, inflamed focally               adenomatous  polyp  Review of patient's family history indicates:   Hypertension                   Mother                   Cancer                         Mother                   Cancer                         Father                   Diabetes                       Sister                   Social History   Marital Status: Married             Spouse Name:                      Years of Education:                 Number of children: 4           Occupational History Occupation          Development worker, community - Retired Surveyor, mining*                       Social History Main Topics   Smoking Status: Current Some Day Smoker         Packs/Day:       Years: 45        Types: Cigarettes   Smokeless Status: Current User                     Comment: 1-2 cigarettes OF THE NEW ELECTRIC             CIGARETTES   Alcohol Use: Yes           0.5 oz/week      1 Drinks containing 0.5 oz of alcohol per week      Comment: occ   Drug Use: No             Sexual Activity: Not on file        Other Topics            Concern   None on file  Social History Narrative   None on file    ROS:see history of present illness otherwise negative  PHYSICAL EXAM: Well-nournished, in no acute distress. Neck: murmur portrayed in the carotids,No JVD, HJR, Bruit, or thyroid enlargement  Lungs: No tachypnea, clear without wheezing, rales, or rhonchi  Cardiovascular: RRR, 2- 3/6 diastolic murmur at the left sternal border, 1-2/6 systolic murmur at the left sternal border and apex, no gallops, bruit, thrill, or heave.  Abdomen: BS normal. Soft without organomegaly, masses, lesions or tenderness.  Extremities: without cyanosis, clubbing or edema. Good distal pulses bilateral  SKin: Warm, no lesions or rashes   Musculoskeletal: No deformities  Neuro: no focal signs  BP 130/64  Pulse 67  Ht 5' 10.5" (1.791 m)  Wt 227 lb (102.967 kg)  BMI 32.11 kg/m2  SpO2 97%

## 2012-02-01 NOTE — Assessment & Plan Note (Signed)
Patient has mild to moderate AI on echo in July 2013. We will repeat this in July 2014.

## 2012-02-01 NOTE — Assessment & Plan Note (Addendum)
His creatinine was 4.55 in July 2013. We will repeat this today. He has an appointment with Dr. Briant Cedar, [facility] on February 14 2012

## 2012-03-08 ENCOUNTER — Other Ambulatory Visit: Payer: Self-pay | Admitting: *Deleted

## 2012-03-08 MED ORDER — METOLAZONE 2.5 MG PO TABS: 2.5000 mg | ORAL_TABLET | Freq: Every day | ORAL | Status: DC | PRN

## 2012-03-12 ENCOUNTER — Ambulatory Visit: Payer: Medicare Other | Admitting: Family Medicine

## 2012-03-12 ENCOUNTER — Encounter: Payer: Self-pay | Admitting: Internal Medicine

## 2012-03-15 ENCOUNTER — Encounter: Payer: Self-pay | Admitting: Internal Medicine

## 2012-03-15 ENCOUNTER — Ambulatory Visit: Payer: Medicare Other | Admitting: Family Medicine

## 2012-06-14 NOTE — Progress Notes (Signed)
QMV:HQIO is a 67 year-old [place] American male patient Dr. Juanito Doom who has history of chronic systolic and diastolic heart failure.He also had a history of orthostatic hypotension and syncope due to dehydration and chronic kidney disease. His last 2-D echo was in July 2013 his EF was 25-30% and he had mild to moderate AI as well as grade 1 diastolic dysfunction. His last seen by Wynell Balloon in January of 2014 with no medication changes or tests ordered.   He comes today with complaints of pounding HR which he experiences in his neck and his ears. He has no chest pain, dizziness or shortness of breath with this. Usually goes away on its own. He admit to being under a lot of pressure with his mother in law in Hospice which is causing him not to sleep well and be tense.   Allergies  Allergen Reactions  . Ace Inhibitors     REACTION: cough    Current Outpatient Prescriptions  Medication Sig Dispense Refill  . aspirin (ASPIRIN LOW DOSE) 81 MG EC tablet Take 81 mg by mouth daily.        . cloNIDine (CATAPRES) 0.3 MG tablet Take 0.3 mg by mouth 2 (two) times daily.      Marland Kitchen DIOVAN 160 MG tablet Take 160 mg by mouth daily.       . hydrALAZINE (APRESOLINE) 25 MG tablet Take 25 mg by mouth 2 (two) times daily.      . insulin glargine (LANTUS) 100 UNIT/ML injection Inject 10 Units into the skin at bedtime.       . metolazone (ZAROXOLYN) 2.5 MG tablet Take 1 tablet (2.5 mg total) by mouth daily as needed. Swelling/Shortness of Breath  30 tablet  3  . torsemide (DEMADEX) 100 MG tablet Take 100 mg by mouth 2 (two) times daily.       Marland Kitchen allopurinol (ZYLOPRIM) 100 MG tablet       . COLCRYS 0.6 MG tablet        No current facility-administered medications for this visit.    Past Medical History  Diagnosis Date  . Congestive heart failure 10/2008    preserved LV systolic function   . Aortic insufficiency 09/2008    Normal left ventricular size and function : graded as mild by Doppler , but pulse  pressure is widened  . 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 Hypertension   . 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 Erectile dysfunction   . Colonic polyp   . Low serum testosterone level   . Tobacco abuse   . Sleep apnea 2010    Initially unable to afford CPAP  . Benign prostatic hypertrophy   . Obesity, Class I, BMI 30-34.9   . Hyperlipidemia   . Congestive heart failure (CHF)     EF 25-30% 07/2011  . Adenomatous colon polyp 2006  . H. pylori infection 2013    treated with prevpac    Past Surgical History  Procedure Laterality Date  . Retinal laser procedure      diabetic retinopathy  . Lymph node biopsy      surgical exploration of neck-not entirely clear that this represented a lymph node biopsy  . Colonoscopy  08/23/2004    Dr. Elmer Ramp rectum, diminutive polyp of the rectosigmoid removed, inflamed focally adenomatous polyp    ROS:.Review of systems complete  and found to be negative unless listed above  PHYSICAL EXAM BP 142/62  Pulse 64  Ht 5' 10.5" (1.791 m)  Wt 216 lb (97.977 kg)  BMI 30.54 kg/m2  SpO2 98%  General: Well developed, well nourished, in no acute distress Head: Eyes PERRLA, No xanthomas.   Normal cephalic and atramatic  Lungs: Clear bilaterally to auscultation and percussion. Heart: HRRR S1 S2, with 1/6 systolic murmur Pulses are 2+ & equal.            Soft carotid bruit on the right.. No JVD.  No abdominal bruits. No femoral bruits. Abdomen: Bowel sounds are positive, abdomen soft and non-tender without masses or                  Hernia's noted. Msk:  Back normal, normal gait. Normal strength and tone for age. Extremities: No clubbing, cyanosis or edema.  DP +1 Neuro: Alert and oriented X 3. Psych:  Good affect, responds appropriately    ASSESSMENT AND PLAN

## 2012-06-15 ENCOUNTER — Ambulatory Visit (INDEPENDENT_AMBULATORY_CARE_PROVIDER_SITE_OTHER): Payer: Medicare Other | Admitting: Adult Health

## 2012-06-15 ENCOUNTER — Encounter: Payer: Self-pay | Admitting: Adult Health

## 2012-06-15 VITALS — BP 142/62 | HR 64 | Ht 70.5 in | Wt 216.0 lb

## 2012-06-15 DIAGNOSIS — I10 Essential (primary) hypertension: Secondary | ICD-10-CM

## 2012-06-15 DIAGNOSIS — R0989 Other specified symptoms and signs involving the circulatory and respiratory systems: Secondary | ICD-10-CM

## 2012-06-15 DIAGNOSIS — I059 Rheumatic mitral valve disease, unspecified: Secondary | ICD-10-CM

## 2012-06-15 DIAGNOSIS — I340 Nonrheumatic mitral (valve) insufficiency: Secondary | ICD-10-CM

## 2012-06-15 DIAGNOSIS — I359 Nonrheumatic aortic valve disorder, unspecified: Secondary | ICD-10-CM

## 2012-06-15 NOTE — Assessment & Plan Note (Addendum)
Mildly elevated on this visit. Will not make any medication changes however unless elevated on future visit. He will have CMET completed to evaluate his kidney and liver status. He will return in a couple of weeks. He may be responding to the emotional stress with a dying family member, which is causing symptoms.

## 2012-06-15 NOTE — Assessment & Plan Note (Addendum)
He had echocardiogram in July of 2013 demonstrating mild AI, despite loud murmur. Systolic Dysfunction was also noted. Will consider repeating Echo on next visit for evaluation of improvement of LVEF. He is on appropriate medications with the exception of BB due to COPD.

## 2012-06-15 NOTE — Patient Instructions (Addendum)
Your physician recommends that you schedule a follow-up appointment in: 2 WEEKS  Your physician has requested that you have a carotid duplex. This test is an ultrasound of the carotid arteries in your neck. It looks at blood flow through these arteries that supply the brain with blood. Allow one hour for this exam. There are no restrictions or special instructions.  Your physician recommends that you return for lab work in: TODAY (SLIPS GIVEN) CMET

## 2012-06-15 NOTE — Progress Notes (Deleted)
Name: [name]    DOB: Jun 08, 1945  Age: 67 y.o.  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   VS Filed Vitals:   06/15/12 1419  BP: 142/62  Pulse: 64  Height: 5' 10.5" (1.791 m)  Weight: 216 lb (97.977 kg)  SpO2: 98%    Weights Current Weight  06/15/12 216 lb (97.977 kg)  02/01/12 227 lb (102.967 kg)  09/06/11 232 lb 1.9 oz (105.289 kg)    Blood Pressure  BP Readings from Last 3 Encounters:  06/15/12 142/62  02/01/12 130/64  09/06/11 154/52     Admit date:  (Not on file) Last encounter with RMR:  Visit date not found   Allergy Ace inhibitors  Current Outpatient Prescriptions  Medication Sig Dispense Refill  . aspirin (ASPIRIN LOW DOSE) 81 MG EC tablet Take 81 mg by mouth daily.        . cloNIDine (CATAPRES) 0.3 MG tablet Take 0.3 mg by mouth 2 (two) times daily.      Marland Kitchen DIOVAN 160 MG tablet Take 160 mg by mouth daily.       . hydrALAZINE (APRESOLINE) 25 MG tablet Take 25 mg by mouth 2 (two) times daily.      . insulin glargine (LANTUS) 100 UNIT/ML injection Inject 10 Units into the skin at bedtime.       . metolazone (ZAROXOLYN) 2.5 MG tablet Take 1 tablet (2.5 mg total) by mouth daily as needed. Swelling/Shortness of Breath  30 tablet  3  . torsemide (DEMADEX) 100 MG tablet Take 100 mg by mouth 2 (two) times daily.        No current facility-administered medications for this visit.    Discontinued Meds:    Medications Discontinued During This Encounter  Medication Reason  . linagliptin (TRADJENTA) 5 MG TABS tablet Completed Course  . calcitRIOL (ROCALTROL) 0.25 MCG capsule Completed Course  . bismuth-metronidazole-tetracycline (PYLERA) 140-125-125 MG per capsule Completed Course    Patient Active Problem List   Diagnosis Date Noted  . CKD (chronic kidney disease) stage 4, GFR 15-29 ml/min 08/14/2011   . Moderate mitral regurgitation 08/12/2011  . Cholelithiasis 08/10/2011  . Moderate dehydration 07/25/2011  . Chronic systolic congestive heart failure 07/21/2011  . Dyspnea 07/19/2011  . COPD (chronic obstructive pulmonary disease) 07/18/2011  . Pulmonary nodule 07/14/2011  . Poor appetite 07/12/2011  . Change in bowel movement 07/12/2011  . Syncope 05/12/2011  . Sinusitis 05/12/2011  . Ankle pain, left 02/03/2011  . ED (erectile dysfunction) 11/02/2010  . Hyperlipidemia 05/22/2010  . Aortic valve disease 04/16/2010  . Chronic kidney disease, stage 4, severely decreased GFR 04/16/2010  . SLEEP APNEA 01/14/2010  . DEPRESSION 06/29/2009  . BENIGN PROSTATIC HYPERTROPHY, HX OF 03/02/2009  . DEPENDENT EDEMA, LEGS 02/18/2008  . DIABETES MELLITUS, TYPE II, WITH RETINOPATHY 02/23/2007  . TOBACCO ABUSE 02/23/2007  . HYPERTENSION 02/23/2007    LABS    Component Value Date/Time   NA 137 08/10/2011 1143   NA 139 07/21/2011 0629   NA 137 07/20/2011 0316   K 3.5 08/10/2011 1143   K 3.3* 07/21/2011 0629   K 3.5 07/20/2011 0316   CL 95* 08/10/2011 1143   CL 101 07/21/2011 0629   CL 101 07/20/2011 0316   CO2 30 08/10/2011 1143  CO2 25 07/21/2011 0629   CO2 26 07/20/2011 0316   GLUCOSE 96 08/10/2011 1143   GLUCOSE 96 07/21/2011 0629   GLUCOSE 107* 07/20/2011 0316   BUN 59* 08/10/2011 1143   BUN 41* 07/21/2011 0629   BUN 32* 07/20/2011 0316   CREATININE 4.55* 08/10/2011 1143   CREATININE 3.58* 07/21/2011 0629   CREATININE 3.28* 07/20/2011 0316   CREATININE 3.25* 07/19/2011 0601   CREATININE 3.63* 07/12/2011 1815   CREATININE 5.46* 05/12/2011 0934   CALCIUM 9.6 08/10/2011 1143   CALCIUM 9.0 07/21/2011 0629   CALCIUM 9.1 07/20/2011 0316   GFRNONAA 16* 07/21/2011 0629   GFRNONAA 18* 07/20/2011 0316   GFRNONAA 18* 07/19/2011 0601   GFRAA 19* 07/21/2011 0629   GFRAA 21* 07/20/2011 0316   GFRAA 21* 07/19/2011 0601   CMP     Component Value Date/Time   NA 137 08/10/2011 1143   K 3.5 08/10/2011 1143   CL 95* 08/10/2011 1143   CO2  30 08/10/2011 1143   GLUCOSE 96 08/10/2011 1143   BUN 59* 08/10/2011 1143   CREATININE 4.55* 08/10/2011 1143   CREATININE 3.58* 07/21/2011 0629   CALCIUM 9.6 08/10/2011 1143   PROT 6.3 07/15/2011 1050   ALBUMIN 3.4* 07/15/2011 1050   AST 13 07/15/2011 1050   ALT 11 07/15/2011 1050   ALKPHOS 85 07/15/2011 1050   BILITOT 0.3 07/15/2011 1050   GFRNONAA 16* 07/21/2011 0629   GFRAA 19* 07/21/2011 0629       Component Value Date/Time   WBC 7.7 07/19/2011 0601   WBC 6.4 07/15/2011 1050   WBC 14.3* 05/12/2011 0934   HGB 11.1* 07/19/2011 0601   HGB 10.3* 07/15/2011 1050   HGB 8.1* 05/12/2011 0934   HCT 32.8* 07/19/2011 0601   HCT 30.5* 07/15/2011 1050   HCT 26.1* 05/12/2011 0934   MCV 92.1 07/19/2011 0601   MCV 91.9 07/15/2011 1050   MCV 87.9 05/12/2011 0934    Lipid Panel     Component Value Date/Time   CHOL 171 05/01/2011 0055   TRIG 90 05/01/2011 0055   HDL 57 05/01/2011 0055   CHOLHDL 3.0 05/01/2011 0055   VLDL 18 05/01/2011 0055   LDLCALC 96 05/01/2011 0055    ABG    Component Value Date/Time   PHART 7.414 11/17/2008 2330   PCO2ART 39.9 11/17/2008 2330   PO2ART 79.5* 11/17/2008 2330   HCO3 25.1* 11/17/2008 2330   TCO2 22.6 11/17/2008 2330   ACIDBASEDEF 1.8 11/11/2008 0215   O2SAT 95.7 11/17/2008 2330     Lab Results  Component Value Date   TSH 1.180 07/19/2011   BNP (last 3 results)  Recent Labs  07/15/11 1050 07/19/11 0601  PROBNP 17380.0* 23725.0*   Cardiac Panel (last 3 results) No results found for this basename: CKTOTAL, CKMB, TROPONINI, RELINDX,  in the last 72 hours  Iron/TIBC/Ferritin No results found for this basename: iron, tibc, ferritin     EKG Orders placed in visit on 06/15/12  . EKG 12-LEAD     Prior Assessment and Plan Problem List as of 06/15/2012   DIABETES MELLITUS, TYPE II, WITH RETINOPATHY   Last Assessment & Plan   02/01/2012 Office Visit Written 02/01/2012  2:45 PM by Dyann Kief, PA     Patient continues to have trouble with elevated sugars in the morning. Have  asked him to follow up with his endocrinologist.    TOBACCO ABUSE   Last Assessment & Plan   07/18/2011 Office Visit Written 07/20/2011 12:15 PM by  Kerri Perches, MD     States quit x 5 days, has copd and is at high risk for lung Ca based on smoking history, chest scan ordered, cesation counselling done    DEPRESSION   HYPERTENSION   Last Assessment & Plan   02/01/2012 Office Visit Written 02/01/2012  2:43 PM by Dyann Kief, PA     Controlled    DEPENDENT EDEMA, LEGS   Last Assessment & Plan   09/06/2011 Office Visit Written 09/06/2011  3:09 PM by Gaylord Shih, MD     Much improved.    BENIGN PROSTATIC HYPERTROPHY, HX OF   SLEEP APNEA   Aortic valve disease   Last Assessment & Plan   02/01/2012 Office Visit Written 02/01/2012  2:42 PM by Dyann Kief, PA     Patient has mild to moderate AI on echo in July 2013. We will repeat this in July 2014.    Chronic kidney disease, stage 4, severely decreased GFR   Last Assessment & Plan   02/01/2012 Office Visit Edited 02/01/2012  2:47 PM by Dyann Kief, PA     His creatinine was 4.55 in July 2013. We will repeat this today. He has an appointment with Dr. Briant Cedar, [facility] on February 14 2012    Hyperlipidemia   Last Assessment & Plan   08/01/2011 Office Visit Written 08/14/2011  4:15 PM by Kerri Perches, MD     Hyperlipidemia:Low fat diet discussed and encouraged.  Controlled, no change in medication     ED (erectile dysfunction)   Last Assessment & Plan   02/03/2011 Office Visit Written 02/06/2011  2:53 PM by Kerri Perches, MD     Persistent and a source of some depression, at one time expressed interest in testosterone supplement if indicated, however , when I explained that this would be lifelong , he declined testing    Ankle pain, left   Last Assessment & Plan   02/03/2011 Office Visit Written 02/06/2011  2:52 PM by Kerri Perches, MD     Acute flair, short indocid taper    Syncope   Last Assessment &  Plan   08/12/2011 Office Visit Written 08/12/2011 10:21 AM by Gaylord Shih, MD     No recurrence. He is still mildly orthostatic but his heart rate increases with the lower dose of labetalol. I still think he severely volume depleted. I decreased his torsemide to once a day with careful watching daily weights. If he becomes short of breath, developed swelling, or his weight exceeds greater than 3 pounds, he will go back to twice a day torsemide at that time. While he is taking it once a day we have cut his potassium to once a day. If he takes it twice a day he will take 2 potassiums. I'll see him back for close followup in about 3 weeks. Orthostatic precautions given.    Sinusitis   Last Assessment & Plan   05/12/2011 Office Visit Written 05/15/2011  9:26 AM by Kerri Perches, MD     Sinus congestion with elevated WBC  And a shift, will treat with antibiotic, pt aware    Poor appetite   Last Assessment & Plan   07/12/2011 Office Visit Written 08/10/2011  8:29 AM by Kerri Perches, MD     Symptomatic gallstones, will refer to surgery for further eval. May need cholecystectomy     Change in bowel movement   Pulmonary nodule   Last Assessment &  Plan   07/18/2011 Office Visit Written 07/20/2011 12:15 PM by Kerri Perches, MD     Nicotine x 40 plus years, needs chest Ct    COPD (chronic obstructive pulmonary disease)   Last Assessment & Plan   07/18/2011 Office Visit Edited 07/20/2011 12:42 PM by Kerri Perches, MD      Acute exaccerbation of symptoms aggressive anti inflammatory      Dyspnea   Chronic systolic congestive heart failure   Last Assessment & Plan   02/01/2012 Office Visit Written 02/01/2012  2:41 PM by Dyann Kief, PA     [name] heart failure is well compensated. He did take extra Zaroxolyn on Saturday for weight gain but manages it well.    Moderate dehydration   Last Assessment & Plan   07/25/2011 Office Visit Written 07/25/2011  4:33 PM by Gaylord Shih, MD      He is clearly prerenal. I've advised him to stop his metolazone. His weight range to be  237-43. He will weigh himself daily. I've asked him to drink more water the next few days until weight up  4 pounds. We talked about how to avoid syncope. I discontinued his Hytrin since he started on an alpha-blocker and he has systolic heart failure now. In addition he does not have a prostate issue.  His weight is 02/18/1943 he will take metolazone on a when necessary basis. Otherwise he will stay on Demadex 100 twice a day.  I have him followup with me on July 22 were close followup. We'll check electrolytes then. I am sure his BUN and creatinine are extremely high again.    Cholelithiasis   Last Assessment & Plan   07/12/2011 Office Visit Written 08/10/2011  8:38 AM by Kerri Perches, MD     Pt symptomatic with weight loss    Moderate mitral regurgitation   CKD (chronic kidney disease) stage 4, GFR 15-29 ml/min   Last Assessment & Plan   08/01/2011 Office Visit Written 08/14/2011  4:19 PM by Kerri Perches, MD      Managing his heart and kidney failure continues to be a challenge.  Pt followed by nephrology.        Imaging: No results found.

## 2012-06-16 ENCOUNTER — Encounter: Payer: Self-pay | Admitting: Adult Health

## 2012-06-16 ENCOUNTER — Other Ambulatory Visit: Payer: Self-pay | Admitting: Adult Health

## 2012-06-16 DIAGNOSIS — E876 Hypokalemia: Secondary | ICD-10-CM

## 2012-06-16 LAB — COMPREHENSIVE METABOLIC PANEL
ALT: 9 U/L (ref 0–53)
BUN: 65 mg/dL — ABNORMAL HIGH (ref 6–23)
CO2: 26 mEq/L (ref 19–32)
Calcium: 9.1 mg/dL (ref 8.4–10.5)
Chloride: 98 mEq/L (ref 96–112)
Creat: 3.84 mg/dL — ABNORMAL HIGH (ref 0.50–1.35)
Glucose, Bld: 136 mg/dL — ABNORMAL HIGH (ref 70–99)
Total Bilirubin: 0.4 mg/dL (ref 0.3–1.2)

## 2012-06-16 MED ORDER — POTASSIUM CHLORIDE CRYS ER 20 MEQ PO TBCR: 20.0000 meq | EXTENDED_RELEASE_TABLET | Freq: Every day | ORAL | Status: DC

## 2012-06-16 MED ORDER — METOPROLOL TARTRATE 25 MG PO TABS: 12.5000 mg | ORAL_TABLET | Freq: Two times a day (BID) | ORAL | Status: DC

## 2012-06-16 NOTE — Progress Notes (Signed)
Reviewed labs over the weekend. Found to be hypokalemic with potassium of 3.1. Potassium 20 mEq Rx provided and sent to pharmacy attached to this patient.  Also started on low dose BB, metoprolol 12.5 mg BID. He has EF of 25%-30% on last echo but no BB had been instituted.  He will need to have follow up echo. Consideration for ICD pacemaker after 3 months of optimal therapy if EF does not improve. Seeing him again in 2 weeks with follow up BMET prior to visit.

## 2012-06-18 ENCOUNTER — Ambulatory Visit (HOSPITAL_COMMUNITY)
Admission: RE | Admit: 2012-06-18 | Discharge: 2012-06-18 | Disposition: A | Discharge status: Home / Self Care | Payer: Medicare Other | Source: Ambulatory Visit | Attending: Adult Health | Admitting: Adult Health

## 2012-06-18 DIAGNOSIS — I340 Nonrheumatic mitral (valve) insufficiency: Secondary | ICD-10-CM

## 2012-06-18 DIAGNOSIS — R0989 Other specified symptoms and signs involving the circulatory and respiratory systems: Secondary | ICD-10-CM | POA: Insufficient documentation

## 2012-06-28 ENCOUNTER — Ambulatory Visit: Payer: Medicare Other | Admitting: Adult Health

## 2012-07-11 ENCOUNTER — Ambulatory Visit (INDEPENDENT_AMBULATORY_CARE_PROVIDER_SITE_OTHER): Payer: Medicare Other | Admitting: Adult Health

## 2012-07-11 ENCOUNTER — Telehealth: Payer: Self-pay

## 2012-07-11 ENCOUNTER — Ambulatory Visit: Payer: Medicare Other | Admitting: Adult Health

## 2012-07-11 ENCOUNTER — Encounter: Payer: Self-pay | Admitting: Adult Health

## 2012-07-11 VITALS — BP 130/38 | HR 64 | Ht 70.0 in | Wt 221.0 lb

## 2012-07-11 DIAGNOSIS — I10 Essential (primary) hypertension: Secondary | ICD-10-CM

## 2012-07-11 DIAGNOSIS — E1169 Type 2 diabetes mellitus with other specified complication: Secondary | ICD-10-CM

## 2012-07-11 DIAGNOSIS — E11621 Type 2 diabetes mellitus with foot ulcer: Secondary | ICD-10-CM

## 2012-07-11 DIAGNOSIS — L97509 Non-pressure chronic ulcer of other part of unspecified foot with unspecified severity: Secondary | ICD-10-CM

## 2012-07-11 DIAGNOSIS — I509 Heart failure, unspecified: Secondary | ICD-10-CM

## 2012-07-11 DIAGNOSIS — I359 Nonrheumatic aortic valve disorder, unspecified: Secondary | ICD-10-CM

## 2012-07-11 DIAGNOSIS — E119 Type 2 diabetes mellitus without complications: Secondary | ICD-10-CM

## 2012-07-11 DIAGNOSIS — I5022 Chronic systolic (congestive) heart failure: Secondary | ICD-10-CM

## 2012-07-11 MED ORDER — CEPHALEXIN 500 MG PO CAPS: 500.0000 mg | ORAL_CAPSULE | Freq: Two times a day (BID) | ORAL | Status: AC

## 2012-07-11 NOTE — Assessment & Plan Note (Signed)
Well controlled at present with medication regimen and addition of BB. Repeat echo.

## 2012-07-11 NOTE — Progress Notes (Signed)
HPI: [name] is a 67 year old male patient of Dr. Juanito [name] we are following for ongoing assessment and management of chronic systolic and diastolic heart failure, orthostatic hypotension and syncope due to dehydration, chronic kidney disease. He was last seen in the office on 06/15/2012 with complaints of pounding heart rate which he experiences in his neck and ears. He denied any chest pain. Followup echocardiogram was completed with [facility].    Found to be hypokalemic with potassium of 3.1. Potassium 20 mEq Rx provided and sent to pharmacy attached to this patient. Also started on low dose BB, metoprolol 12.5 mg BID. He has EF of 25%-30% on last echo 7 /2013, but no BB had been instituted. This was a significant deteriorated since last echo in 2012 with normal LVEF. Stress test in 2010 was negative for ischemia. He is here for evaluation of his symptoms, and for medication adjustments. Will need repeat echo.    He is feeling some better concerning palpitations and heart racing. His mother in law died last week. He is having complaints of blisters on his feet that are not healing.   Allergies  Allergen Reactions  . Ace Inhibitors     REACTION: cough    Current Outpatient Prescriptions  Medication Sig Dispense Refill  . allopurinol (ZYLOPRIM) 100 MG tablet       . aspirin (ASPIRIN LOW DOSE) 81 MG EC tablet Take 81 mg by mouth daily.        . cloNIDine (CATAPRES) 0.3 MG tablet Take 0.3 mg by mouth 2 (two) times daily.      Marland Kitchen COLCRYS 0.6 MG tablet       . DIOVAN 160 MG tablet Take 160 mg by mouth as needed.       . hydrALAZINE (APRESOLINE) 25 MG tablet Take 25 mg by mouth 2 (two) times daily.      . insulin glargine (LANTUS) 100 UNIT/ML injection Inject 10 Units into the skin at bedtime.       . metolazone (ZAROXOLYN) 2.5 MG tablet Take 1 tablet (2.5 mg total) by mouth daily as needed. Swelling/Shortness of Breath  30 tablet  3  . metoprolol tartrate (LOPRESSOR) 25 MG tablet Take 0.5 tablets  (12.5 mg total) by mouth 2 (two) times daily.  180 tablet  3  . potassium chloride SA (K-DUR,KLOR-CON) 20 MEQ tablet Take 1 tablet (20 mEq total) by mouth daily.  90 tablet  3  . torsemide (DEMADEX) 100 MG tablet Take 100 mg by mouth 2 (two) times daily.        No current facility-administered medications for this visit.    Past Medical History  Diagnosis Date  . Congestive heart failure 10/2008    preserved LV systolic function   . Aortic insufficiency 09/2008    Normal left ventricular size and function : graded as mild by Doppler , but pulse pressure is widened  . 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 Hypertension   . 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 Erectile dysfunction   . Colonic polyp   . Low serum testosterone level   . Tobacco abuse   . Sleep apnea 2010    Initially unable to afford CPAP  . Benign prostatic hypertrophy   . Obesity, Class I, BMI 30-34.9   . Hyperlipidemia   . Congestive heart failure (CHF)  EF 25-30% 07/2011  . Adenomatous colon polyp 2006  . H. pylori infection 2013    treated with prevpac    Past Surgical History  Procedure Laterality Date  . Retinal laser procedure      diabetic retinopathy  . Lymph node biopsy      surgical exploration of neck-not entirely clear that this represented a lymph node biopsy  . Colonoscopy  08/23/2004    Dr. Elmer [name] rectum, diminutive polyp of the rectosigmoid removed, inflamed focally adenomatous polyp    ZOX:WRUEAV of systems complete and found to be negative unless listed above  PHYSICAL EXAM BP 130/38  Pulse 64  Ht 5\' 10"  (1.778 m)  Wt 221 lb (100.245 kg)  BMI 31.71 kg/m2  General: Well developed, well nourished, in no acute distress Head: Eyes PERRLA, No xanthomas.   Normal cephalic and atramatic  Lungs: Clear bilaterally to auscultation and percussion. Heart: HRRR S1 S2, 2/6 holosystolic murmur. .   Pulses are 2+ & equal.            Radiation carotid bruit. No JVD.  No abdominal bruits. No femoral bruits. Abdomen: Bowel sounds are positive, abdomen soft and non-tender without masses or                  Hernia's noted. Msk:  Back normal, normal gait. Normal strength and tone for age. Extremities: No clubbing, cyanosis or edema. Blister not well healed noted on left foot at 3rd metatarsal, and darkened skin around nail bed of 3 rd metatarsal of right foot with mild erythema, without pain,   DP +1 Neuro: Alert and oriented X 3. Psych:  Good affect, responds appropriately  EKG:NSR with occasional PVCs, LVH. Rate of 62 bpm  ASSESSMENT AND PLAN

## 2012-07-11 NOTE — Assessment & Plan Note (Signed)
Noted foot ulcers on the tops of both feet at the 3rd metatarsal, with skin color changes on the right middle toe, with erythema. Will start Keflex 500mg  BID for 10 days. He is referred to podiatrist for ongoing evaluation. Good arterial pulses.

## 2012-07-11 NOTE — Patient Instructions (Addendum)
Your physician recommends that you schedule a follow-up appointment in: 1 MONTH  Your physician has recommended you make the following change in your medication: Keflex 500 mg BID x 10 days  Your physician has requested that you have an echocardiogram. Echocardiography is a painless test that uses sound waves to create images of your heart. It provides your doctor with information about the size and shape of your heart and how well your heart's chambers and valves are working. This procedure takes approximately one hour. There are no restrictions for this procedure.

## 2012-07-11 NOTE — Telephone Encounter (Signed)
Left message for patient to return my call.    RE:  Patient has been referred to podiatry.  Contacted [facility] [phone] to request appointment.  Per receptionist, patient will have to settle balance on account with their office before they will schedule.  Patient can then make appointment while there.  Address is [name], [place].

## 2012-07-11 NOTE — Assessment & Plan Note (Signed)
Appears compensated. Concerns about significant EF dysfunction, found on last years echo, without follow up. Will repeat echo and adjust medications to optimal doses. Consider cath if remains depressed.

## 2012-07-11 NOTE — Telephone Encounter (Signed)
Received call from Mr. Stannard regarding referral to podiatry.  Advised him to contact podiatrist office and speak to them on balance of account.  Offered phone number, he stated he has it.  Advised if he needs [place] to schedule or assist to please call back.

## 2012-07-16 ENCOUNTER — Ambulatory Visit (HOSPITAL_COMMUNITY)
Admission: RE | Admit: 2012-07-16 | Discharge: 2012-07-16 | Disposition: A | Discharge status: Home / Self Care | Payer: Medicare Other | Source: Ambulatory Visit | Attending: Cardiology | Admitting: Cardiology

## 2012-07-16 DIAGNOSIS — I10 Essential (primary) hypertension: Secondary | ICD-10-CM

## 2012-07-16 DIAGNOSIS — J4489 Other specified chronic obstructive pulmonary disease: Secondary | ICD-10-CM | POA: Insufficient documentation

## 2012-07-16 DIAGNOSIS — F17200 Nicotine dependence, unspecified, uncomplicated: Secondary | ICD-10-CM | POA: Insufficient documentation

## 2012-07-16 DIAGNOSIS — I359 Nonrheumatic aortic valve disorder, unspecified: Secondary | ICD-10-CM | POA: Insufficient documentation

## 2012-07-16 DIAGNOSIS — E119 Type 2 diabetes mellitus without complications: Secondary | ICD-10-CM | POA: Insufficient documentation

## 2012-07-16 DIAGNOSIS — J449 Chronic obstructive pulmonary disease, unspecified: Secondary | ICD-10-CM | POA: Insufficient documentation

## 2012-07-16 NOTE — Progress Notes (Signed)
*  PRELIMINARY RESULTS* Echocardiogram 2D Echocardiogram has been performed.  Conrad Ossineke 07/16/2012, 3:16 PM

## 2012-07-29 ENCOUNTER — Other Ambulatory Visit: Payer: Self-pay | Admitting: Family Medicine

## 2012-07-31 ENCOUNTER — Other Ambulatory Visit: Payer: Self-pay | Admitting: *Deleted

## 2012-07-31 DIAGNOSIS — Z01810 Encounter for preprocedural cardiovascular examination: Secondary | ICD-10-CM

## 2012-07-31 DIAGNOSIS — N184 Chronic kidney disease, stage 4 (severe): Secondary | ICD-10-CM

## 2012-08-03 ENCOUNTER — Encounter: Payer: Self-pay | Admitting: Surgery

## 2012-08-14 ENCOUNTER — Encounter: Payer: Self-pay | Admitting: Adult Health

## 2012-08-14 ENCOUNTER — Ambulatory Visit (INDEPENDENT_AMBULATORY_CARE_PROVIDER_SITE_OTHER): Payer: Medicare Other | Admitting: Adult Health

## 2012-08-14 VITALS — BP 164/54 | HR 61 | Ht 70.0 in | Wt 214.2 lb

## 2012-08-14 DIAGNOSIS — G4730 Sleep apnea, unspecified: Secondary | ICD-10-CM

## 2012-08-14 DIAGNOSIS — I5022 Chronic systolic (congestive) heart failure: Secondary | ICD-10-CM

## 2012-08-14 DIAGNOSIS — I509 Heart failure, unspecified: Secondary | ICD-10-CM

## 2012-08-14 DIAGNOSIS — I10 Essential (primary) hypertension: Secondary | ICD-10-CM

## 2012-08-14 MED ORDER — METOPROLOL TARTRATE 25 MG PO TABS: 12.5000 mg | ORAL_TABLET | Freq: Two times a day (BID) | ORAL | Status: DC

## 2012-08-14 NOTE — Patient Instructions (Signed)

## 2012-08-14 NOTE — Progress Notes (Deleted)
Name: LATRON RIBAS    DOB: 23-Sep-1945  Age: 67 y.o.  MR#: [id number]       PCP:  Avon Gully, MD      Insurance: Payor: [employer] MEDICARE / Plan: AARP MEDICARE COMPLETE / Product Type: *No Product type* /   CC:    Chief Complaint  Patient presents with  . Aortic Stenosis  . Hypertension    VS Filed Vitals:   08/14/12 1331  BP: 164/54  Pulse: 61  Height: 5\' 10"  (1.778 m)  Weight: 214 lb 4 oz (97.183 kg)    Weights Current Weight  08/14/12 214 lb 4 oz (97.183 kg)  07/11/12 221 lb (100.245 kg)  06/15/12 216 lb (97.977 kg)    Blood Pressure  BP Readings from Last 3 Encounters:  08/14/12 164/54  07/11/12 130/38  06/15/12 142/62     Admit date:  (Not on file) Last encounter with [facility]:  07/11/2012   Allergy Ace inhibitors  Current Outpatient Prescriptions  Medication Sig Dispense Refill  . allopurinol (ZYLOPRIM) 100 MG tablet       . aspirin (ASPIRIN LOW DOSE) 81 MG EC tablet Take 81 mg by mouth daily.        . calcitRIOL (ROCALTROL) 0.25 MCG capsule       . COLCRYS 0.6 MG tablet       . DIOVAN 160 MG tablet Take 160 mg by mouth as needed.       . hydrALAZINE (APRESOLINE) 50 MG tablet       . insulin glargine (LANTUS) 100 UNIT/ML injection Inject 10 Units into the skin at bedtime.       Marland Kitchen ketoconazole (NIZORAL) 2 % cream       . metolazone (ZAROXOLYN) 2.5 MG tablet Take 1 tablet (2.5 mg total) by mouth daily as needed. Swelling/Shortness of Breath  30 tablet  3  . ONE TOUCH ULTRA TEST test strip       . potassium chloride SA (K-DUR,KLOR-CON) 20 MEQ tablet Take 1 tablet (20 mEq total) by mouth daily.  90 tablet  3  . torsemide (DEMADEX) 100 MG tablet Take 100 mg by mouth 2 (two) times daily.       . cloNIDine (CATAPRES) 0.3 MG tablet Take 0.3 mg by mouth 2 (two) times daily.       No current facility-administered medications for this visit.    Discontinued Meds:    Medications Discontinued During This Encounter  Medication Reason  . cephALEXin (KEFLEX) 500  MG capsule Error  . metoprolol tartrate (LOPRESSOR) 25 MG tablet Error    Patient Active Problem List   Diagnosis Date Noted  . CKD (chronic kidney disease) stage 4, GFR 15-29 ml/min 08/14/2011  . Moderate mitral regurgitation 08/12/2011  . Cholelithiasis 08/10/2011  . Moderate dehydration 07/25/2011  . Chronic systolic congestive heart failure 07/21/2011  . Dyspnea 07/19/2011  . COPD (chronic obstructive pulmonary disease) 07/18/2011  . Pulmonary nodule 07/14/2011  . Poor appetite 07/12/2011  . Change in bowel movement 07/12/2011  . Syncope 05/12/2011  . Sinusitis 05/12/2011  . Ankle pain, left 02/03/2011  . ED (erectile dysfunction) 11/02/2010  . Hyperlipidemia 05/22/2010  . Aortic valve disease 04/16/2010  . Chronic kidney disease, stage 4, severely decreased GFR 04/16/2010  . SLEEP APNEA 01/14/2010  . DEPRESSION 06/29/2009  . BENIGN PROSTATIC HYPERTROPHY, HX OF 03/02/2009  . DEPENDENT EDEMA, LEGS 02/18/2008  . DIABETES MELLITUS, TYPE II, WITH RETINOPATHY 02/23/2007  . TOBACCO ABUSE 02/23/2007  . HYPERTENSION 02/23/2007  LABS    Component Value Date/Time   NA 135 06/15/2012 1506   NA 137 08/10/2011 1143   NA 139 07/21/2011 0629   K 3.1* 06/15/2012 1506   K 3.5 08/10/2011 1143   K 3.3* 07/21/2011 0629   CL 98 06/15/2012 1506   CL 95* 08/10/2011 1143   CL 101 07/21/2011 0629   CO2 26 06/15/2012 1506   CO2 30 08/10/2011 1143   CO2 25 07/21/2011 0629   GLUCOSE 136* 06/15/2012 1506   GLUCOSE 96 08/10/2011 1143   GLUCOSE 96 07/21/2011 0629   BUN 65* 06/15/2012 1506   BUN 59* 08/10/2011 1143   BUN 41* 07/21/2011 0629   CREATININE 3.84* 06/15/2012 1506   CREATININE 4.55* 08/10/2011 1143   CREATININE 3.58* 07/21/2011 0629   CREATININE 3.28* 07/20/2011 0316   CREATININE 3.25* 07/19/2011 0601   CREATININE 3.63* 07/12/2011 1815   CALCIUM 9.1 06/15/2012 1506   CALCIUM 9.6 08/10/2011 1143   CALCIUM 9.0 07/21/2011 0629   GFRNONAA 16* 07/21/2011 0629   GFRNONAA 18* 07/20/2011 0316   GFRNONAA 18* 07/19/2011  0601   GFRAA 19* 07/21/2011 0629   GFRAA 21* 07/20/2011 0316   GFRAA 21* 07/19/2011 0601   CMP     Component Value Date/Time   NA 135 06/15/2012 1506   K 3.1* 06/15/2012 1506   CL 98 06/15/2012 1506   CO2 26 06/15/2012 1506   GLUCOSE 136* 06/15/2012 1506   BUN 65* 06/15/2012 1506   CREATININE 3.84* 06/15/2012 1506   CREATININE 3.58* 07/21/2011 0629   CALCIUM 9.1 06/15/2012 1506   PROT 6.5 06/15/2012 1506   ALBUMIN 3.7 06/15/2012 1506   AST 13 06/15/2012 1506   ALT 9 06/15/2012 1506   ALKPHOS 81 06/15/2012 1506   BILITOT 0.4 06/15/2012 1506   GFRNONAA 16* 07/21/2011 0629   GFRAA 19* 07/21/2011 0629       Component Value Date/Time   WBC 7.7 07/19/2011 0601   WBC 6.4 07/15/2011 1050   WBC 14.3* 05/12/2011 0934   HGB 11.1* 07/19/2011 0601   HGB 10.3* 07/15/2011 1050   HGB 8.1* 05/12/2011 0934   HCT 32.8* 07/19/2011 0601   HCT 30.5* 07/15/2011 1050   HCT 26.1* 05/12/2011 0934   MCV 92.1 07/19/2011 0601   MCV 91.9 07/15/2011 1050   MCV 87.9 05/12/2011 0934    Lipid Panel     Component Value Date/Time   CHOL 171 05/01/2011 0055   TRIG 90 05/01/2011 0055   HDL 57 05/01/2011 0055   CHOLHDL 3.0 05/01/2011 0055   VLDL 18 05/01/2011 0055   LDLCALC 96 05/01/2011 0055    ABG    Component Value Date/Time   PHART 7.414 11/17/2008 2330   PCO2ART 39.9 11/17/2008 2330   PO2ART 79.5* 11/17/2008 2330   HCO3 25.1* 11/17/2008 2330   TCO2 22.6 11/17/2008 2330   ACIDBASEDEF 1.8 11/11/2008 0215   O2SAT 95.7 11/17/2008 2330     Lab Results  Component Value Date   TSH 1.180 07/19/2011   BNP (last 3 results) No results found for this basename: PROBNP,  in the last 8760 hours Cardiac Panel (last 3 results) No results found for this basename: CKTOTAL, CKMB, TROPONINI, RELINDX,  in the last 72 hours  Iron/TIBC/Ferritin No results found for this basename: iron, tibc, ferritin     EKG Orders placed in visit on 07/11/12  . EKG 12-LEAD     Prior Assessment and Plan Problem List as of 08/14/2012     Cardiovascular and  Mediastinum   HYPERTENSION   Last Assessment & Plan   07/11/2012 Office Visit Written 07/11/2012 10:42 AM by Jodelle Gross, NP     Well controlled at present with medication regimen and addition of BB. Repeat echo.    Aortic valve disease   Last Assessment & Plan   06/15/2012 Office Visit Edited 06/15/2012  4:37 PM by Jodelle Gross, NP     He had echocardiogram in July of 2013 demonstrating mild AI, despite loud murmur. Systolic Dysfunction was also noted. Will consider repeating Echo on next visit for evaluation of improvement of LVEF. He is on appropriate medications with the exception of BB due to COPD.    Syncope   Last Assessment & Plan   08/12/2011 Office Visit Written 08/12/2011 10:21 AM by Gaylord Shih, MD     No recurrence. He is still mildly orthostatic but his heart rate increases with the lower dose of labetalol. I still think he severely volume depleted. I decreased his torsemide to once a day with careful watching daily weights. If he becomes short of breath, developed swelling, or his weight exceeds greater than 3 pounds, he will go back to twice a day torsemide at that time. While he is taking it once a day we have cut his potassium to once a day. If he takes it twice a day he will take 2 potassiums. I'll see him back for close followup in about 3 weeks. Orthostatic precautions given.    Chronic systolic congestive heart failure   Last Assessment & Plan   07/11/2012 Office Visit Written 07/11/2012 10:44 AM by Jodelle Gross, NP     Appears compensated. Concerns about significant EF dysfunction, found on last years echo, without follow up. Will repeat echo and adjust medications to optimal doses. Consider cath if remains depressed.    Moderate mitral regurgitation     Respiratory   Sinusitis   Last Assessment & Plan   05/12/2011 Office Visit Written 05/15/2011  9:26 AM by Kerri Perches, MD     Sinus congestion with elevated WBC  And a shift, will treat with  antibiotic, pt aware    COPD (chronic obstructive pulmonary disease)   Last Assessment & Plan   07/18/2011 Office Visit Edited 07/20/2011 12:42 PM by Kerri Perches, MD      Acute exaccerbation of symptoms aggressive anti inflammatory        Digestive   Cholelithiasis   Last Assessment & Plan   07/12/2011 Office Visit Written 08/10/2011  8:38 AM by Kerri Perches, MD     Pt symptomatic with weight loss      Endocrine   DIABETES MELLITUS, TYPE II, WITH RETINOPATHY   Last Assessment & Plan   07/11/2012 Office Visit Written 07/11/2012 10:41 AM by Jodelle Gross, NP     Noted foot ulcers on the tops of both feet at the 3rd metatarsal, with skin color changes on the right middle toe, with erythema. Will start Keflex 500mg  BID for 10 days. He is referred to podiatrist for ongoing evaluation. Good arterial pulses.      Genitourinary   Chronic kidney disease, stage 4, severely decreased GFR   Last Assessment & Plan   02/01/2012 Office Visit Edited 02/01/2012  2:47 PM by Dyann Kief, PA     His creatinine was 4.55 in July 2013. We will repeat this today. He has an appointment with Dr. Briant Cedar, [facility] on February 14 2012  ED (erectile dysfunction)   Last Assessment & Plan   02/03/2011 Office Visit Written 02/06/2011  2:53 PM by Kerri Perches, MD     Persistent and a source of some depression, at one time expressed interest in testosterone supplement if indicated, however , when I explained that this would be lifelong , he declined testing    CKD (chronic kidney disease) stage 4, GFR 15-29 ml/min   Last Assessment & Plan   08/01/2011 Office Visit Written 08/14/2011  4:19 PM by Kerri Perches, MD      Managing his heart and kidney failure continues to be a challenge.  Pt followed by nephrology.      Other   TOBACCO ABUSE   Last Assessment & Plan   07/18/2011 Office Visit Written 07/20/2011 12:15 PM by Kerri Perches, MD     States quit x 5 days, has copd  and is at high risk for lung Ca based on smoking history, chest scan ordered, cesation counselling done    DEPRESSION   DEPENDENT EDEMA, LEGS   Last Assessment & Plan   09/06/2011 Office Visit Written 09/06/2011  3:09 PM by Gaylord Shih, MD     Much improved.    BENIGN PROSTATIC HYPERTROPHY, HX OF   SLEEP APNEA   Hyperlipidemia   Last Assessment & Plan   08/01/2011 Office Visit Written 08/14/2011  4:15 PM by Kerri Perches, MD     Hyperlipidemia:Low fat diet discussed and encouraged.  Controlled, no change in medication     Ankle pain, left   Last Assessment & Plan   02/03/2011 Office Visit Written 02/06/2011  2:52 PM by Kerri Perches, MD     Acute flair, short indocid taper    Poor appetite   Last Assessment & Plan   07/12/2011 Office Visit Written 08/10/2011  8:29 AM by Kerri Perches, MD     Symptomatic gallstones, will refer to surgery for further eval. May need cholecystectomy     Change in bowel movement   Pulmonary nodule   Last Assessment & Plan   07/18/2011 Office Visit Written 07/20/2011 12:15 PM by Kerri Perches, MD     Nicotine x 40 plus years, needs chest Ct    Dyspnea   Moderate dehydration   Last Assessment & Plan   07/25/2011 Office Visit Written 07/25/2011  4:33 PM by Gaylord Shih, MD     He is clearly prerenal. I've advised him to stop his metolazone. His weight range to be  237-43. He will weigh himself daily. I've asked him to drink more water the next few days until weight up  4 pounds. We talked about how to avoid syncope. I discontinued his Hytrin since he started on an alpha-blocker and he has systolic heart failure now. In addition he does not have a prostate issue.  His weight is 02/18/1943 he will take metolazone on a when necessary basis. Otherwise he will stay on Demadex 100 twice a day.  I have him followup with me on July 22 were close followup. We'll check electrolytes then. I am sure his BUN and creatinine are extremely high again.         Imaging: No results found.

## 2012-08-14 NOTE — Progress Notes (Signed)
HPI: Mr. Hausen is a 67 year old patient of Dr. Juanito Doom we are following for ongoing assessment and management of chronic systolic and diastolic CHF, orthostatic hypotension and syncope due to dehydration, with history of chronic kidney disease. He was last seen in the office on 07/11/2012. I last visit the patient was be well compensated an echocardiogram was completed to evaluate EF and adjust medications for optimal dosing. Patient also was noted to have some blisters on the tops of both feet at the third metatarsal with skin color changes in the middle right toe with your edema. He was started on Keflex for 10 days and referred to podiatrist for ongoing evaluation.   Echocardiogram demonstrated Left ventricle: LVEF is normal at approximately 60% with mild basal inferior hypokinesis. The cavity size was moderately dilated. Wall thickness was increased in a pattern of mild LVH. Doppler parameters are consistent with abnormal left ventricular relaxation (grade 1 diastolic dysfunction).   He comes today without cardiac complaintt. He is medically compliant.     Allergies  Allergen Reactions  . Ace Inhibitors     REACTION: cough    Current Outpatient Prescriptions  Medication Sig Dispense Refill  . allopurinol (ZYLOPRIM) 100 MG tablet       . aspirin (ASPIRIN LOW DOSE) 81 MG EC tablet Take 81 mg by mouth daily.        . calcitRIOL (ROCALTROL) 0.25 MCG capsule       . COLCRYS 0.6 MG tablet       . DIOVAN 160 MG tablet Take 160 mg by mouth as needed.       . hydrALAZINE (APRESOLINE) 50 MG tablet       . insulin glargine (LANTUS) 100 UNIT/ML injection Inject 10 Units into the skin at bedtime.       Marland Kitchen ketoconazole (NIZORAL) 2 % cream       . metolazone (ZAROXOLYN) 2.5 MG tablet Take 1 tablet (2.5 mg total) by mouth daily as needed. Swelling/Shortness of Breath  30 tablet  3  . ONE TOUCH ULTRA TEST test strip       . potassium chloride SA (K-DUR,KLOR-CON) 20 MEQ tablet Take 1 tablet (20 mEq  total) by mouth daily.  90 tablet  3  . torsemide (DEMADEX) 100 MG tablet Take 100 mg by mouth 2 (two) times daily.       . cloNIDine (CATAPRES) 0.3 MG tablet Take 0.3 mg by mouth 2 (two) times daily.       No current facility-administered medications for this visit.    Past Medical History  Diagnosis Date  . Congestive heart failure 10/2008    preserved LV systolic function   . Aortic insufficiency 09/2008    Normal left ventricular size and function : graded as mild by Doppler , but pulse pressure is widened  . 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 Hypertension   . 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 Erectile dysfunction   . Colonic polyp   . Low serum testosterone level   . Tobacco abuse   . Sleep apnea 2010    Initially unable to afford CPAP  . Benign prostatic hypertrophy   . Obesity, Class I, BMI 30-34.9   . Hyperlipidemia   . Congestive heart failure (CHF)     EF 25-30% 07/2011  . Adenomatous colon polyp 2006  . H.  pylori infection 2013    treated with prevpac    Past Surgical History  Procedure Laterality Date  . Retinal laser procedure      diabetic retinopathy  . Lymph node biopsy      surgical exploration of neck-not entirely clear that this represented a lymph node biopsy  . Colonoscopy  08/23/2004    Dr. Elmer Ramp rectum, diminutive polyp of the rectosigmoid removed, inflamed focally adenomatous polyp    ZOX:WRUEAV of systems complete and found to be negative unless listed above  PHYSICAL EXAM BP 164/54  Pulse 61  Ht 5\' 10"  (1.778 m)  Wt 214 lb 4 oz (97.183 kg)  BMI 30.74 kg/m2  General: Well developed, well nourished, in no acute distress Head: Eyes PERRLA, No xanthomas.   Normal cephalic and atramatic  Lungs: Clear bilaterally to auscultation and percussion. Heart: HRRR S1 S2, S4 murmur is auscultated.with 1/6 systolic murmur.   Pulses are 2+ & equal.             No carotid bruit. No JVD.  No abdominal bruits. No femoral bruits. Abdomen: Bowel sounds are positive, abdomen soft and non-tender without masses or                  Hernia's noted. Msk:  Back normal, normal gait. Normal strength and tone for age. Extremities: No clubbing, cyanosis or edema.  DP +1 Neuro: Alert and oriented X 3. Psych:  Good affect, responds appropriately   ASSESSMENT AND PLAN:

## 2012-08-14 NOTE — Assessment & Plan Note (Signed)
Encouraged to use CPAP as directed.

## 2012-08-14 NOTE — Assessment & Plan Note (Signed)
He is hypertensive on this visit. I rechecked in in the office manually, and found to correlate with his triage BP. He is easily annoyed and irritable but is compliant with medications.   Will continue him on all medications as directed. If remains elevated, medication changes will be made. He is advised on stress relief activities.

## 2012-08-14 NOTE — Assessment & Plan Note (Signed)
No evidence of fluid retention or decompensation. EF is markedly improved from 30% to 60%. He will continue on current meds. See him again in 6 months.

## 2012-08-31 ENCOUNTER — Encounter: Payer: Self-pay | Admitting: Surgery

## 2012-09-03 ENCOUNTER — Encounter (INDEPENDENT_AMBULATORY_CARE_PROVIDER_SITE_OTHER): Payer: Medicare Other | Admitting: Vascular Surgery

## 2012-09-03 ENCOUNTER — Encounter: Payer: Self-pay | Admitting: Surgery

## 2012-09-03 ENCOUNTER — Ambulatory Visit (INDEPENDENT_AMBULATORY_CARE_PROVIDER_SITE_OTHER): Payer: Medicare Other | Admitting: Surgery

## 2012-09-03 VITALS — BP 163/39 | HR 52 | Resp 16 | Ht 71.0 in | Wt 216.6 lb

## 2012-09-03 DIAGNOSIS — N184 Chronic kidney disease, stage 4 (severe): Secondary | ICD-10-CM

## 2012-09-03 DIAGNOSIS — Z01810 Encounter for preprocedural cardiovascular examination: Secondary | ICD-10-CM

## 2012-09-03 NOTE — Progress Notes (Signed)
[facility] [place]   Patient name: [name] MRN: [id number] DOB: [birth year] Sex: male   Referred by:  renal  Reason for referral:  Chief Complaint  Patient presents with  . Follow-up    access placement for AVF  referral from Dr. Briant Cedar    HISTORY OF PRESENT ILLNESS: This is a 67 year old gentleman that I have seen for evaluation of dialysis access. He is right-handed. He is not yet on dialysis. He has stage IV renal disease. He has very few symptoms. Minimal edema. No shortness of breath. His renal failure secondary to hypertension and diabetes. He states that his blood sugars run in the 120 range. He does take insulin. He is on multiple medications for his blood pressure. He has a history of congestive heart failure with ejection fraction in the 30% range his history of tobacco abuse.  Past Medical History  Diagnosis Date  . Congestive heart failure 10/2008    preserved LV systolic function   . Aortic insufficiency 09/2008    Normal left ventricular size and function : graded as mild by Doppler , but pulse pressure is widened  . 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 Hypertension   . 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 Erectile dysfunction   . Colonic polyp   . Low serum testosterone level   . Tobacco abuse   . Sleep apnea 2010    Initially unable to afford CPAP  . Benign prostatic hypertrophy   . Obesity, Class I, BMI 30-34.9   . Hyperlipidemia   . Congestive heart failure (CHF)     EF 25-30% 07/2011  . Adenomatous colon polyp 2006  . H. pylori infection 2013    treated with prevpac    Past Surgical History  Procedure Laterality Date  . Retinal laser procedure      diabetic retinopathy  . Lymph node biopsy      surgical exploration of neck-not entirely clear that this represented a lymph node biopsy  . Colonoscopy  08/23/2004    Dr.  Elmer Ramp rectum, diminutive polyp of the rectosigmoid removed, inflamed focally adenomatous polyp    History   Social History  . Marital Status: Married    Spouse Name: N/A    Number of Children: 4  . Years of Education: N/A   Occupational History  . 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 Smoking status: Current Some Day Smoker -- 45 years    Types: Cigarettes  . Smokeless tobacco: Current User     Comment: 1-2 cigarettes OF THE NEW ELECTRIC CIGARETTES  . Alcohol Use: 0.5 oz/week    1 drink(s) per week     Comment: occ  . Drug Use: No  . Sexual Activity: Not on file   Other Topics Concern  . Not on file   Social History Narrative  . No narrative on file    Family History  Problem Relation Age of Onset  . Hypertension Mother   . Cancer Mother   . Cancer Father   . Diabetes Sister     Allergies as of 09/03/2012 - Review Complete 09/03/2012  Allergen Reaction Noted  . Ace inhibitors      Current Outpatient Prescriptions on File Prior to Visit  Medication Sig Dispense Refill  . allopurinol (ZYLOPRIM) 100 MG tablet       .  aspirin (ASPIRIN LOW DOSE) 81 MG EC tablet Take 81 mg by mouth daily.        . calcitRIOL (ROCALTROL) 0.25 MCG capsule       . COLCRYS 0.6 MG tablet       . DIOVAN 160 MG tablet Take 160 mg by mouth as needed.       . hydrALAZINE (APRESOLINE) 50 MG tablet       . insulin glargine (LANTUS) 100 UNIT/ML injection Inject 10 Units into the skin at bedtime.       Marland Kitchen ketoconazole (NIZORAL) 2 % cream       . metolazone (ZAROXOLYN) 2.5 MG tablet Take 1 tablet (2.5 mg total) by mouth daily as needed. Swelling/Shortness of Breath  30 tablet  3  . metoprolol tartrate (LOPRESSOR) 25 MG tablet Take 0.5 tablets (12.5 mg total) by mouth 2 (two) times daily.  180 tablet  3  . ONE TOUCH ULTRA TEST test strip       . potassium chloride SA (K-DUR,KLOR-CON) 20 MEQ tablet Take 1 tablet (20 mEq total) by mouth daily.  90 tablet  3  .  torsemide (DEMADEX) 100 MG tablet Take 100 mg by mouth 2 (two) times daily.       . cloNIDine (CATAPRES) 0.3 MG tablet Take 0.3 mg by mouth 2 (two) times daily.       No current facility-administered medications on file prior to visit.     REVIEW OF SYSTEMS: Cardiovascular: No chest pain, chest pressure, palpitations, orthopnea, or dyspnea on exertion. No claudication or rest pain,  No history of DVT or phlebitis. Pulmonary: No productive cough, asthma or wheezing. Neurologic: No weakness, paresthesias, aphasia, or amaurosis. No dizziness. Hematologic: No bleeding problems or clotting disorders. Musculoskeletal: No joint pain or joint swelling. Gastrointestinal: No blood in stool or hematemesis Genitourinary: No dysuria or hematuria. Psychiatric:: No history of major depression. Integumentary: No rashes or ulcers. Constitutional: No fever or chills.  PHYSICAL EXAMINATION: General: The patient appears their stated age.  Vital signs are BP 163/39  Pulse 52  Resp 16  Ht 5\' 11"  (1.803 m)  Wt 216 lb 9.6 oz (98.249 kg)  BMI 30.22 kg/m2 HEENT:  No gross abnormalities Pulmonary: Respirations are non-labored Musculoskeletal: There are no major deformities.   Neurologic: No focal weakness or paresthesias are detected, Skin: There are no ulcer or rashes noted. Psychiatric: The patient has normal affect. Cardiovascular: There is a regular rate and rhythm without significant murmur appreciated. Palpable left radial pulse  Diagnostic Studies: Vein mapping was ordered and reviewed. He has an excellent left cephalic vein measuring from 0.28 in the distal arm up to 0.38 in the upper arm.    Assessment:  Stage IV renal failure Plan: The patient will be Candidate for a left radiocephalic fistula. I spent an excessive amount of time discussing with the patient and his wife the indications for the procedure. Based of many questions regarding dialysis. I feel it is best that these questions to  ask by his nephrologist prior to his operation. I did discuss the risks of non-maturity and steal syndrome. He understands he may require additional operations to get his fistula to mature. The patient will contact me to schedule a left radiocephalic fistula after his questions have been answered by Dr. Briant Cedar, with whom he has an appointment on September 11     V. Charlena Cross, M.D. [facility] [place] Office: [phone] Pager:  [phone]

## 2012-09-06 ENCOUNTER — Ambulatory Visit (HOSPITAL_COMMUNITY)
Admission: RE | Admit: 2012-09-06 | Discharge: 2012-09-06 | Disposition: A | Discharge status: Home / Self Care | Payer: Medicare Other | Source: Ambulatory Visit | Attending: Internal Medicine | Admitting: Internal Medicine

## 2012-09-06 ENCOUNTER — Other Ambulatory Visit (HOSPITAL_COMMUNITY): Payer: Self-pay | Admitting: Internal Medicine

## 2012-09-06 DIAGNOSIS — M25469 Effusion, unspecified knee: Secondary | ICD-10-CM | POA: Insufficient documentation

## 2012-09-06 DIAGNOSIS — M25562 Pain in left knee: Secondary | ICD-10-CM

## 2012-09-06 DIAGNOSIS — M25569 Pain in unspecified knee: Secondary | ICD-10-CM | POA: Insufficient documentation

## 2012-10-15 ENCOUNTER — Encounter: Payer: Self-pay | Admitting: Nephrology

## 2013-02-11 ENCOUNTER — Ambulatory Visit (INDEPENDENT_AMBULATORY_CARE_PROVIDER_SITE_OTHER): Payer: Medicare Other | Admitting: Adult Health

## 2013-02-11 ENCOUNTER — Encounter: Payer: Self-pay | Admitting: Adult Health

## 2013-02-11 VITALS — BP 148/58 | HR 61 | Ht 70.0 in | Wt 207.0 lb

## 2013-02-11 DIAGNOSIS — I5022 Chronic systolic (congestive) heart failure: Secondary | ICD-10-CM

## 2013-02-11 DIAGNOSIS — I10 Essential (primary) hypertension: Secondary | ICD-10-CM

## 2013-02-11 DIAGNOSIS — I509 Heart failure, unspecified: Secondary | ICD-10-CM

## 2013-02-11 DIAGNOSIS — N184 Chronic kidney disease, stage 4 (severe): Secondary | ICD-10-CM

## 2013-02-11 NOTE — Assessment & Plan Note (Signed)
No evidence of fluid retention. He continues on demedex. He is to continue on current medications. I will check a BMET for evaluation of kidney status. I will send copies to Dr.s Legrand Rams and Nida.

## 2013-02-11 NOTE — Patient Instructions (Signed)
Your physician recommends that you schedule a follow-up appointment in: 6 months with Dr Virgina Jock will receive a reminder letter two months in advance reminding you to call and schedule your appointment. If you don't receive this letter, please contact our office.  Your physician recommends that you return for lab work in 16 month BMET (around 03/11/13)  Your physician recommends that you see Dr Legrand Rams for treatment of gout flare up this week.

## 2013-02-11 NOTE — Progress Notes (Deleted)
Name: [name]    DOB: December 26, 1945  Age: 68 y.o.  MR#: [id number]       PCP:  Rosita Fire, MD      Insurance: Payor: [employer] MEDICARE / Plan: AARP MEDICARE COMPLETE / Product Type: *No Product type* /   CC:    Chief Complaint  Patient presents with  . Hypertension  PT NOTES HIS MEDICATIONS HAS BECOME HIGH   VS Filed Vitals:   02/11/13 1300  BP: 148/58  Pulse: 61  Height: 5\' 10"  (1.778 m)  Weight: 207 lb (93.895 kg)    Weights Current Weight  02/11/13 207 lb (93.895 kg)  09/03/12 216 lb 9.6 oz (98.249 kg)  08/14/12 214 lb 4 oz (97.183 kg)    Blood Pressure  BP Readings from Last 3 Encounters:  02/11/13 148/58  09/03/12 163/39  08/14/12 164/54     Admit date:  (Not on file) Last encounter with RMR:  Visit date not found   Allergy Ace inhibitors  Current Outpatient Prescriptions  Medication Sig Dispense Refill  . Alcohol Swabs (ALCOHOL PREP) 70 % PADS       . allopurinol (ZYLOPRIM) 100 MG tablet Take 100 mg by mouth daily.       Marland Kitchen aspirin (ASPIRIN LOW DOSE) 81 MG EC tablet Take 81 mg by mouth daily.        . ASSURE COMFORT LANCETS 30G MISC       . Blood Glucose Calibration (GMATE CONTROL LEVEL 2) SOLN       . Blood Glucose Monitoring Suppl (GMATE VOICE BLOOD GLUCOSE SYS) DEVI       . cloNIDine (CATAPRES) 0.3 MG tablet Take 0.3 mg by mouth 2 (two) times daily.      Marland Kitchen DIOVAN 160 MG tablet Take 160 mg by mouth as needed.       . hydrALAZINE (APRESOLINE) 50 MG tablet Take 50 mg by mouth 2 (two) times daily.       . insulin glargine (LANTUS) 100 UNIT/ML injection Inject 10 Units into the skin at bedtime.       Marland Kitchen JANUVIA 25 MG tablet Take 25 mg by mouth daily.       Marland Kitchen ketoconazole (NIZORAL) 2 % cream       . metolazone (ZAROXOLYN) 2.5 MG tablet Take 1 tablet (2.5 mg total) by mouth daily as needed. Swelling/Shortness of Breath  30 tablet  3  . metoprolol tartrate (LOPRESSOR) 25 MG tablet Take 0.5 tablets (12.5 mg total) by mouth 2 (two) times daily.  180  tablet  3  . ONE TOUCH ULTRA TEST test strip       . potassium chloride SA (K-DUR,KLOR-CON) 20 MEQ tablet Take 1 tablet (20 mEq total) by mouth daily.  90 tablet  3  . torsemide (DEMADEX) 100 MG tablet Take 100 mg by mouth 2 (two) times daily.       Marland Kitchen COLCRYS 0.6 MG tablet Take 0.6 mg by mouth daily.        No current facility-administered medications for this visit.    Discontinued Meds:    Medications Discontinued During This Encounter  Medication Reason  . calcitRIOL (ROCALTROL) 0.25 MCG capsule Error    Patient Active Problem List   Diagnosis Date Noted  . Chronic kidney disease, stage IV (severe) 09/03/2012  . CKD (chronic kidney disease) stage 4, GFR 15-29 ml/min 08/14/2011  . Moderate mitral regurgitation 08/12/2011  . Cholelithiasis 08/10/2011  . Moderate dehydration 07/25/2011  . Chronic systolic congestive heart  failure 07/21/2011  . Dyspnea 07/19/2011  . COPD (chronic obstructive pulmonary disease) 07/18/2011  . Pulmonary nodule 07/14/2011  . Poor appetite 07/12/2011  . Change in bowel movement 07/12/2011  . Syncope 05/12/2011  . Sinusitis 05/12/2011  . Ankle pain, left 02/03/2011  . ED (erectile dysfunction) 11/02/2010  . Hyperlipidemia 05/22/2010  . Aortic valve disease 04/16/2010  . Chronic kidney disease, stage 4, severely decreased GFR 04/16/2010  . SLEEP APNEA 01/14/2010  . DEPRESSION 06/29/2009  . BENIGN PROSTATIC HYPERTROPHY, HX OF 03/02/2009  . DEPENDENT EDEMA, LEGS 02/18/2008  . DIABETES MELLITUS, TYPE II, WITH RETINOPATHY 02/23/2007  . TOBACCO ABUSE 02/23/2007  . HYPERTENSION 02/23/2007    LABS    Component Value Date/Time   NA 135 06/15/2012 1506   NA 137 08/10/2011 1143   NA 139 07/21/2011 0629   K 3.1* 06/15/2012 1506   K 3.5 08/10/2011 1143   K 3.3* 07/21/2011 0629   CL 98 06/15/2012 1506   CL 95* 08/10/2011 1143   CL 101 07/21/2011 0629   CO2 26 06/15/2012 1506   CO2 30 08/10/2011 1143   CO2 25 07/21/2011 0629   GLUCOSE 136* 06/15/2012 1506    GLUCOSE 96 08/10/2011 1143   GLUCOSE 96 07/21/2011 0629   BUN 65* 06/15/2012 1506   BUN 59* 08/10/2011 1143   BUN 41* 07/21/2011 0629   CREATININE 3.84* 06/15/2012 1506   CREATININE 4.55* 08/10/2011 1143   CREATININE 3.58* 07/21/2011 0629   CREATININE 3.28* 07/20/2011 0316   CREATININE 3.25* 07/19/2011 0601   CREATININE 3.63* 07/12/2011 1815   CALCIUM 9.1 06/15/2012 1506   CALCIUM 9.6 08/10/2011 1143   CALCIUM 9.0 07/21/2011 0629   GFRNONAA 16* 07/21/2011 0629   GFRNONAA 18* 07/20/2011 0316   GFRNONAA 18* 07/19/2011 0601   GFRAA 19* 07/21/2011 0629   GFRAA 21* 07/20/2011 0316   GFRAA 21* 07/19/2011 0601   CMP     Component Value Date/Time   NA 135 06/15/2012 1506   K 3.1* 06/15/2012 1506   CL 98 06/15/2012 1506   CO2 26 06/15/2012 1506   GLUCOSE 136* 06/15/2012 1506   BUN 65* 06/15/2012 1506   CREATININE 3.84* 06/15/2012 1506   CREATININE 3.58* 07/21/2011 0629   CALCIUM 9.1 06/15/2012 1506   PROT 6.5 06/15/2012 1506   ALBUMIN 3.7 06/15/2012 1506   AST 13 06/15/2012 1506   ALT 9 06/15/2012 1506   ALKPHOS 81 06/15/2012 1506   BILITOT 0.4 06/15/2012 1506   GFRNONAA 16* 07/21/2011 0629   GFRAA 19* 07/21/2011 0629       Component Value Date/Time   WBC 7.7 07/19/2011 0601   WBC 6.4 07/15/2011 1050   WBC 14.3* 05/12/2011 0934   HGB 11.1* 07/19/2011 0601   HGB 10.3* 07/15/2011 1050   HGB 8.1* 05/12/2011 0934   HCT 32.8* 07/19/2011 0601   HCT 30.5* 07/15/2011 1050   HCT 26.1* 05/12/2011 0934   MCV 92.1 07/19/2011 0601   MCV 91.9 07/15/2011 1050   MCV 87.9 05/12/2011 0934    Lipid Panel     Component Value Date/Time   CHOL 171 05/01/2011 0055   TRIG 90 05/01/2011 0055   HDL 57 05/01/2011 0055   CHOLHDL 3.0 05/01/2011 0055   VLDL 18 05/01/2011 0055   LDLCALC 96 05/01/2011 0055    ABG    Component Value Date/Time   PHART 7.414 11/17/2008 2330   PCO2ART 39.9 11/17/2008 2330   PO2ART 79.5* 11/17/2008 2330   HCO3 25.1* 11/17/2008 2330   TCO2  22.6 11/17/2008 2330   ACIDBASEDEF 1.8 11/11/2008 0215   O2SAT 95.7 11/17/2008 2330      Lab Results  Component Value Date   TSH 1.180 07/19/2011   BNP (last 3 results) No results found for this basename: PROBNP,  in the last 8760 hours Cardiac Panel (last 3 results) No results found for this basename: CKTOTAL, CKMB, TROPONINI, RELINDX,  in the last 72 hours  Iron/TIBC/Ferritin No results found for this basename: iron, tibc, ferritin     EKG Orders placed in visit on 07/11/12  . EKG 12-LEAD     Prior Assessment and Plan Problem List as of 02/11/2013   DIABETES MELLITUS, TYPE II, WITH RETINOPATHY   Last Assessment & Plan   07/11/2012 Office Visit Written 07/11/2012 10:41 AM by Lendon Colonel, NP     Noted foot ulcers on the tops of both feet at the 3rd metatarsal, with skin color changes on the right middle toe, with erythema. Will start Keflex 500mg  BID for 10 days. He is referred to podiatrist for ongoing evaluation. Good arterial pulses.    TOBACCO ABUSE   Last Assessment & Plan   07/18/2011 Office Visit Written 07/20/2011 12:15 PM by Fayrene Helper, MD     States quit x 5 days, has copd and is at high risk for lung Ca based on smoking history, chest scan ordered, cesation counselling done    DEPRESSION   HYPERTENSION   Last Assessment & Plan   08/14/2012 Office Visit Written 08/14/2012  2:05 PM by Lendon Colonel, NP     He is hypertensive on this visit. I rechecked in in the office manually, and found to correlate with his triage BP. He is easily annoyed and irritable but is compliant with medications.   Will continue him on all medications as directed. If remains elevated, medication changes will be made. He is advised on stress relief activities.    DEPENDENT EDEMA, LEGS   Last Assessment & Plan   09/06/2011 Office Visit Written 09/06/2011  3:09 PM by Renella Cunas, MD     Much improved.    BENIGN PROSTATIC HYPERTROPHY, HX OF   SLEEP APNEA   Last Assessment & Plan   08/14/2012 Office Visit Written 08/14/2012  2:06 PM by Lendon Colonel, NP      Encouraged to use CPAP as directed.    Aortic valve disease   Last Assessment & Plan   06/15/2012 Office Visit Edited 06/15/2012  4:37 PM by Lendon Colonel, NP     He had echocardiogram in July of 2013 demonstrating mild AI, despite loud murmur. Systolic Dysfunction was also noted. Will consider repeating Echo on next visit for evaluation of improvement of LVEF. He is on appropriate medications with the exception of BB due to COPD.    Chronic kidney disease, stage 4, severely decreased GFR   Last Assessment & Plan   02/01/2012 Office Visit Edited 02/01/2012  2:47 PM by Imogene Burn, PA     His creatinine was 4.55 in July 2013. We will repeat this today. He has an appointment with Dr. Mercy Moore, [place] [facility] on February 14 2012    Hyperlipidemia   Last Assessment & Plan   08/01/2011 Office Visit Written 08/14/2011  4:15 PM by Fayrene Helper, MD     Hyperlipidemia:Low fat diet discussed and encouraged.  Controlled, no change in medication     ED (erectile dysfunction)   Last Assessment & Plan   02/03/2011 Office Visit  Written 02/06/2011  2:53 PM by Fayrene Helper, MD     Persistent and a source of some depression, at one time expressed interest in testosterone supplement if indicated, however , when I explained that this would be lifelong , he declined testing    Ankle pain, left   Last Assessment & Plan   02/03/2011 Office Visit Written 02/06/2011  2:52 PM by Fayrene Helper, MD     Acute flair, short indocid taper    Syncope   Last Assessment & Plan   08/12/2011 Office Visit Written 08/12/2011 10:21 AM by Renella Cunas, MD     No recurrence. He is still mildly orthostatic but his heart rate increases with the lower dose of labetalol. I still think he severely volume depleted. I decreased his torsemide to once a day with careful watching daily weights. If he becomes short of breath, developed swelling, or his weight exceeds greater than 3 pounds, he will go back to twice a day  torsemide at that time. While he is taking it once a day we have cut his potassium to once a day. If he takes it twice a day he will take 2 potassiums. I'll see him back for close followup in about 3 weeks. Orthostatic precautions given.    Sinusitis   Last Assessment & Plan   05/12/2011 Office Visit Written 05/15/2011  9:26 AM by Fayrene Helper, MD     Sinus congestion with elevated WBC  And a shift, will treat with antibiotic, pt aware    Poor appetite   Last Assessment & Plan   07/12/2011 Office Visit Written 08/10/2011  8:29 AM by Fayrene Helper, MD     Symptomatic gallstones, will refer to surgery for further eval. May need cholecystectomy     Change in bowel movement   Pulmonary nodule   Last Assessment & Plan   07/18/2011 Office Visit Written 07/20/2011 12:15 PM by Fayrene Helper, MD     Nicotine x 40 plus years, needs chest Ct    COPD (chronic obstructive pulmonary disease)   Last Assessment & Plan   07/18/2011 Office Visit Edited 07/20/2011 12:42 PM by Fayrene Helper, MD      Acute exaccerbation of symptoms aggressive anti inflammatory      Dyspnea   Chronic systolic congestive heart failure   Last Assessment & Plan   08/14/2012 Office Visit Written 08/14/2012  2:06 PM by Lendon Colonel, NP     No evidence of fluid retention or decompensation. EF is markedly improved from 30% to 60%. He will continue on current meds. See him again in 6 months.    Moderate dehydration   Last Assessment & Plan   07/25/2011 Office Visit Written 07/25/2011  4:33 PM by Renella Cunas, MD     He is clearly prerenal. I've advised him to stop his metolazone. His weight range to be  237-43. He will weigh himself daily. I've asked him to drink more water the next few days until weight up  4 pounds. We talked about how to avoid syncope. I discontinued his Hytrin since he started on an alpha-blocker and he has systolic heart failure now. In addition he does not have a prostate issue.  His weight  is 02/18/1943 he will take metolazone on a when necessary basis. Otherwise he will stay on Demadex 100 twice a day.  I have him followup with me on July 22 were close followup. We'll check electrolytes then. I  am sure his BUN and creatinine are extremely high again.    Cholelithiasis   Last Assessment & Plan   07/12/2011 Office Visit Written 08/10/2011  8:38 AM by Fayrene Helper, MD     Pt symptomatic with weight loss    Moderate mitral regurgitation   CKD (chronic kidney disease) stage 4, GFR 15-29 ml/min   Last Assessment & Plan   08/01/2011 Office Visit Written 08/14/2011  4:19 PM by Fayrene Helper, MD      Managing his heart and kidney failure continues to be a challenge.  Pt followed by nephrology.    Chronic kidney disease, stage IV (severe)       Imaging: No results found.

## 2013-02-11 NOTE — Assessment & Plan Note (Signed)
Defer to Dr. Hinda Lenis.

## 2013-02-11 NOTE — Progress Notes (Signed)
HPI: [name] is a 68 year old patient who will be assigned to Dr. Pennelope Bracken we are following for ongoing assessment and management of chronic systolic and diastolic CHF, orthostatic hypotension and syncope due to dehydration. He also has a history of chronic kidney disease he was last seen in the office in July of 2014. Most recent echocardiogram demonstrated LVEF approximately 60% with mild basal inferior hypokinesis. Thickness was increased in a pattern of mild LVH. Grade 1 diastolic dysfunction was noted. On last visit the patient was without complaints. At the time of last office visit the patient's blood pressure was mildly elevated at 164/54. No medication changes were made. He was encouraged to use his CPAP as directed.      He comes today without cardiac complaint.He has had another flare up of gout. He has CKD Stage IV and is resistant to dialysis or placement of AV fistula. He is followed by Dr. Hinda Lenis and Dr. Dorris Fetch endocrinologist. They are also in favor of dialysis measures.   Allergies  Allergen Reactions  . Ace Inhibitors     REACTION: cough    Current Outpatient Prescriptions  Medication Sig Dispense Refill  . Alcohol Swabs (ALCOHOL PREP) 70 % PADS       . allopurinol (ZYLOPRIM) 100 MG tablet Take 100 mg by mouth daily.       Marland Kitchen aspirin (ASPIRIN LOW DOSE) 81 MG EC tablet Take 81 mg by mouth daily.        . ASSURE COMFORT LANCETS 30G MISC       . Blood Glucose Calibration (GMATE CONTROL LEVEL 2) SOLN       . Blood Glucose Monitoring Suppl (GMATE VOICE BLOOD GLUCOSE SYS) DEVI       . cloNIDine (CATAPRES) 0.3 MG tablet Take 0.3 mg by mouth 2 (two) times daily.      Marland Kitchen DIOVAN 160 MG tablet Take 160 mg by mouth as needed.       . hydrALAZINE (APRESOLINE) 50 MG tablet Take 50 mg by mouth 2 (two) times daily.       . insulin glargine (LANTUS) 100 UNIT/ML injection Inject 10 Units into the skin at bedtime.       Marland Kitchen JANUVIA 25 MG tablet Take 25 mg by mouth daily.       Marland Kitchen  ketoconazole (NIZORAL) 2 % cream       . metolazone (ZAROXOLYN) 2.5 MG tablet Take 1 tablet (2.5 mg total) by mouth daily as needed. Swelling/Shortness of Breath  30 tablet  3  . metoprolol tartrate (LOPRESSOR) 25 MG tablet Take 0.5 tablets (12.5 mg total) by mouth 2 (two) times daily.  180 tablet  3  . ONE TOUCH ULTRA TEST test strip       . potassium chloride SA (K-DUR,KLOR-CON) 20 MEQ tablet Take 1 tablet (20 mEq total) by mouth daily.  90 tablet  3  . torsemide (DEMADEX) 100 MG tablet Take 100 mg by mouth 2 (two) times daily.       Marland Kitchen COLCRYS 0.6 MG tablet Take 0.6 mg by mouth daily.        No current facility-administered medications for this visit.    Past Medical History  Diagnosis Date  . Congestive heart failure 10/2008    preserved LV systolic function   . Aortic insufficiency 09/2008    Normal left ventricular size and function : graded as mild by Doppler , but pulse pressure is widened  . 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 Hypertension   . 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 Erectile dysfunction   . Colonic polyp   . Low serum testosterone level   . Tobacco abuse   . Sleep apnea 2010    Initially unable to afford CPAP  . Benign prostatic hypertrophy   . Obesity, Class I, BMI 30-34.9   . Hyperlipidemia   . Congestive heart failure (CHF)     EF 25-30% 07/2011  . Adenomatous colon polyp 2006  . H. pylori infection 2013    treated with prevpac    Past Surgical History  Procedure Laterality Date  . Retinal laser procedure      diabetic retinopathy  . Lymph node biopsy      surgical exploration of neck-not entirely clear that this represented a lymph node biopsy  . Colonoscopy  08/23/2004    Dr. Vivi Ferns rectum, diminutive polyp of the rectosigmoid removed, inflamed focally adenomatous polyp    ROS: Review of systems complete and found to be negative unless listed  above  PHYSICAL EXAM BP 148/58  Pulse 61  Ht 5\' 10"  (1.778 m)  Wt 207 lb (93.895 kg)  BMI 29.70 kg/m2  General: Well developed, well nourished, in no acute distress Head: Eyes PERRLA, No xanthomas.   Normal cephalic and atramatic  Lungs: Clear bilaterally to auscultation and percussion. Heart: HRRR S1 S2, with 1/6 systolic murmur.   Pulses are 2+ & equal.            No carotid bruit. No JVD.  No abdominal bruits. No femoral bruits. Abdomen: Bowel sounds are positive, abdomen soft and non-tender without masses or                  Hernia's noted. Msk:  Back normal, normal gait. Normal strength and tone for age. Extremities: No clubbing, cyanosis pain and edema in the right foot from gout..  DP +1 Neuro: Alert and oriented X 3. Psych:  Good affect, responds appropriately  ASSESSMENT AND PLAN

## 2013-02-11 NOTE — Assessment & Plan Note (Signed)
BP is well controlled currently. No changes in his meds.  He will follow up with Dr. Bronson Ing for establishment with cardiologist.

## 2013-02-13 ENCOUNTER — Other Ambulatory Visit (HOSPITAL_COMMUNITY): Payer: Self-pay | Admitting: Nephrology

## 2013-02-13 DIAGNOSIS — N289 Disorder of kidney and ureter, unspecified: Secondary | ICD-10-CM

## 2013-02-28 ENCOUNTER — Ambulatory Visit (HOSPITAL_COMMUNITY): Payer: Medicare Other | Attending: Nephrology

## 2013-03-27 ENCOUNTER — Encounter: Payer: Self-pay | Admitting: Family Medicine

## 2013-04-09 ENCOUNTER — Ambulatory Visit (INDEPENDENT_AMBULATORY_CARE_PROVIDER_SITE_OTHER): Payer: Medicare Other | Admitting: Family Medicine

## 2013-04-09 ENCOUNTER — Encounter: Payer: Self-pay | Admitting: Family Medicine

## 2013-04-09 VITALS — BP 138/50 | HR 58 | Temp 97.1°F | Resp 14 | Wt 202.0 lb

## 2013-04-09 DIAGNOSIS — E1139 Type 2 diabetes mellitus with other diabetic ophthalmic complication: Secondary | ICD-10-CM

## 2013-04-09 DIAGNOSIS — I509 Heart failure, unspecified: Secondary | ICD-10-CM

## 2013-04-09 DIAGNOSIS — E785 Hyperlipidemia, unspecified: Secondary | ICD-10-CM

## 2013-04-09 DIAGNOSIS — N184 Chronic kidney disease, stage 4 (severe): Secondary | ICD-10-CM

## 2013-04-09 DIAGNOSIS — Z79899 Other long term (current) drug therapy: Secondary | ICD-10-CM

## 2013-04-09 DIAGNOSIS — Z23 Encounter for immunization: Secondary | ICD-10-CM

## 2013-04-09 DIAGNOSIS — I10 Essential (primary) hypertension: Secondary | ICD-10-CM

## 2013-04-09 DIAGNOSIS — Z0000 Encounter for general adult medical examination without abnormal findings: Secondary | ICD-10-CM

## 2013-04-09 DIAGNOSIS — I5022 Chronic systolic (congestive) heart failure: Secondary | ICD-10-CM

## 2013-04-09 LAB — URIC ACID: Uric Acid, Serum: 9.5 mg/dL — ABNORMAL HIGH (ref 4.0–7.8)

## 2013-04-09 LAB — PSA, MEDICARE: PSA: 0.44 ng/mL (ref <=4.00)

## 2013-04-09 NOTE — Progress Notes (Signed)
Subjective:    Patient ID: [name], male    DOB: Sep 17, 1945, 68 y.o.   MRN: [id number]  HPI Patient is a 68 year old African American male who has a history of severe stage IV chronic kidney disease followed by Dr. Mercy Moore, congestive heart failure. The patient had an ejection fraction of 30% however after medical therapy his ejection fraction has risen to 60% in June of 2014. He has a history of severe refractory hypertension although his blood pressure today is well controlled 138/50.  He denies any chest pain shortness of breath or dyspnea exertion. The patient had Pneumovax 23 in 2010. He is due for Prevnar 13. The patient reports a colonoscopy 5 years ago and is not due for another 5 years. He is overdue for colonoscopy.  His nephrologist has recommended an AV fistula but the patient is currently refusing placement of AV fistula. We have long discussion today about the reasons to have access in case it is needed for dialysis.  Otherwise he has no concerns. He also has a history of aortic valve insufficiency, diabetes mellitus type 2 which is followed by his endocrinologist, tobacco abuse, and gout. Past Medical History  Diagnosis Date  . Congestive heart failure 10/2008    preserved LV systolic function   . Aortic insufficiency 09/2008    Normal left ventricular size and function : graded as mild by Doppler , but pulse pressure is widened  . 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 Hypertension   . 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 Erectile dysfunction   . Colonic polyp   . Low serum testosterone level   . Tobacco abuse   . Sleep apnea 2010    Initially unable to afford CPAP  . Benign prostatic hypertrophy   . Obesity, Class I, BMI 30-34.9   . Hyperlipidemia   . Congestive heart failure (CHF)     EF 25-30% 07/2011  . Adenomatous colon polyp 2006  . H. pylori infection 2013   treated with prevpac   Past Surgical History  Procedure Laterality Date  . Retinal laser procedure      diabetic retinopathy  . Lymph node biopsy      surgical exploration of neck-not entirely clear that this represented a lymph node biopsy  . Colonoscopy  08/23/2004    Dr. Vivi Ferns rectum, diminutive polyp of the rectosigmoid removed, inflamed focally adenomatous polyp   Current Outpatient Prescriptions on File Prior to Visit  Medication Sig Dispense Refill  . allopurinol (ZYLOPRIM) 100 MG tablet Take 100 mg by mouth daily.       Marland Kitchen aspirin (ASPIRIN LOW DOSE) 81 MG EC tablet Take 81 mg by mouth daily.        . cloNIDine (CATAPRES) 0.3 MG tablet Take 0.3 mg by mouth 2 (two) times daily.      . hydrALAZINE (APRESOLINE) 50 MG tablet Take 50 mg by mouth 2 (two) times daily.       Marland Kitchen JANUVIA 25 MG tablet Take 25 mg by mouth daily.       . metolazone (ZAROXOLYN) 2.5 MG tablet Take 1 tablet (2.5 mg total) by mouth daily as needed. Swelling/Shortness of Breath  30 tablet  3  . metoprolol tartrate (LOPRESSOR) 25 MG tablet Take 0.5 tablets (12.5 mg total) by mouth 2 (two) times daily.  180 tablet  3  .  torsemide (DEMADEX) 100 MG tablet Take 100 mg by mouth 2 (two) times daily.        No current facility-administered medications on file prior to visit.   Allergies  Allergen Reactions  . Ace Inhibitors     REACTION: cough   History   Social History  . Marital Status: Married    Spouse Name: N/A    Number of Children: 4  . Years of Education: N/A   Occupational History  . Semi - Retired Administrator    Social History Main Topics  . Smoking status: Current Some Day Smoker -- 45 years    Types: Cigarettes  . Smokeless tobacco: Current User     Comment: 1-2 cigarettes OF THE NEW ELECTRIC CIGARETTES  . Alcohol Use: 2.4 oz/week    2 Glasses of wine, 2 Shots of liquor per week     Comment: occ  . Drug Use: No  . Sexual Activity: Not on file   Other Topics Concern  . Not on file    Social History Narrative  . No narrative on file   Family History  Problem Relation Age of Onset  . Hypertension Mother   . Cancer Mother   . Cancer Father   . Diabetes Sister       Review of Systems  All other systems reviewed and are negative.       Objective:   Physical Exam  Vitals reviewed. Constitutional: He is oriented to person, place, and time. He appears well-developed and well-nourished. No distress.  HENT:  Head: Normocephalic and atraumatic.  Right Ear: External ear normal.  Left Ear: External ear normal.  Nose: Nose normal.  Mouth/Throat: Oropharynx is clear and moist. No oropharyngeal exudate.  Eyes: Conjunctivae and EOM are normal. Pupils are equal, round, and reactive to light. Right eye exhibits no discharge. Left eye exhibits no discharge. No scleral icterus.  Neck: Normal range of motion. Neck supple. No JVD present. No tracheal deviation present. No thyromegaly present.  Cardiovascular: Normal rate, regular rhythm and intact distal pulses.  Exam reveals no gallop and no friction rub.   Murmur heard. Pulmonary/Chest: Effort normal and breath sounds normal. No stridor. No respiratory distress. He has no wheezes. He has no rales. He exhibits no tenderness.  Abdominal: Soft. Bowel sounds are normal. He exhibits no distension and no mass. There is no tenderness. There is no rebound and no guarding.  Genitourinary: Rectum normal and prostate normal. Guaiac negative stool. No penile tenderness.  Musculoskeletal: Normal range of motion. He exhibits no edema and no tenderness.  Lymphadenopathy:    He has no cervical adenopathy.  Neurological: He is alert and oriented to person, place, and time. He has normal reflexes. He displays normal reflexes. No cranial nerve deficit. He exhibits normal muscle tone. Coordination normal.  Skin: Skin is warm. No rash noted. He is not diaphoretic. No erythema. No pallor.  Psychiatric: He has a normal mood and affect. His  behavior is normal. Judgment and thought content normal.          Assessment & Plan:  1. Encounter for long-term (current) use of other medications - Uric Acid - PSA, Medicare  2. Need for prophylactic vaccination against Streptococcus pneumoniae (pneumococcus) - Pneumococcal conjugate vaccine 13-valent IM  3. Routine general medical examination at a health care facility [name] cancer screening is up to date with regards to his colonoscopy. His prostate exam is normal today. I will check a PSA. Updated his immunizations and gave  the patient Prevnar 13.  He will be due for the Pneumovax 23 booster next year and this week his last pneumonia vaccine. The remainder of his physical exam today is within normal limits.  4. Type II or unspecified type diabetes mellitus with ophthalmic manifestations, not stated as uncontrolled Is followed by endocrinology. He states that his fasting blood sugars are around 130-130 and his postprandial sugars are under 200. Given his fragility and chronic kidney disease this is probably acceptable  5. Chronic systolic congestive heart failure His most recent ejection fraction was excellent.  He is currently followed by cardiology.  Patient has a history of an allergy to ACE inhibitors and cannot tolerate ARB is due to his chronic kidney disease. His blood pressure remains elevated another option would be switching the patient from hydralazine to Bidil.  6. Hyperlipidemia Patient is not fasting today and therefore I cannot check his cholesterol. His goal LDL cholesterol would be less than 100. The patient can return fasting at any time and check his fasting lipid panel  7. HTN (hypertension) Blood pressures well controlled today which is unusual for this patient. I verified her blood pressure today myself. Given his history of congestive heart failure, another option would be switching the patient to BiDil get his blood pressure became elevated.  The present  time it is controlled and I will simply monitor  8. Chronic kidney disease (CKD), stage IV (severe) I recommended the patient follow with Dr. Mercy Moore as planned. I recommended that he saw Dr. Etheleen Nicks instructions about receiving the AV fistula.  The patient will discuss it further at his next office visit with Dr. Mercy Moore.  Check a uric acid level. Goal uric acid level is less than 6.

## 2013-04-11 ENCOUNTER — Other Ambulatory Visit: Payer: Self-pay | Admitting: *Deleted

## 2013-04-11 MED ORDER — FEBUXOSTAT 40 MG PO TABS: 40.0000 mg | ORAL_TABLET | Freq: Every day | ORAL | Status: DC

## 2013-04-11 NOTE — Telephone Encounter (Signed)
Per orders noted on labs, medication sent to pharmacy.   Call placed to patient and patient made aware.  Appointment scheduled.

## 2013-04-16 LAB — BASIC METABOLIC PANEL
BUN: 104 mg/dL — ABNORMAL HIGH (ref 6–23)
CALCIUM: 9.5 mg/dL (ref 8.4–10.5)
CHLORIDE: 93 meq/L — AB (ref 96–112)
CO2: 28 meq/L (ref 19–32)
CREATININE: 4.65 mg/dL — AB (ref 0.50–1.35)
GLUCOSE: 138 mg/dL — AB (ref 70–99)
Potassium: 2.8 mEq/L — ABNORMAL LOW (ref 3.5–5.3)
Sodium: 134 mEq/L — ABNORMAL LOW (ref 135–145)

## 2013-04-19 ENCOUNTER — Telehealth: Payer: Self-pay

## 2013-04-19 DIAGNOSIS — I10 Essential (primary) hypertension: Secondary | ICD-10-CM

## 2013-04-19 NOTE — Telephone Encounter (Signed)
Pt has KCL 20 meq at home and will restart,bmet in 1 week

## 2013-04-19 NOTE — Telephone Encounter (Signed)
Message copied by Bernita Raisin on Fri Apr 19, 2013  9:08 AM ------      Message from: Lendon Colonel      Created: Wed Apr 17, 2013  1:04 PM       Please start potassium 20 mEq po daily. Repeat BMET in one week. ------

## 2013-04-19 NOTE — Telephone Encounter (Signed)
Pt has KCL 20 meq will restart and have bmet in 1 week,see lab results,copy to pcp

## 2013-04-30 ENCOUNTER — Other Ambulatory Visit: Payer: Self-pay | Admitting: Cardiology

## 2013-05-07 ENCOUNTER — Telehealth: Payer: Self-pay | Admitting: *Deleted

## 2013-05-07 NOTE — Telephone Encounter (Signed)
Stay on uloric and start prednisone taper pack

## 2013-05-07 NOTE — Telephone Encounter (Signed)
Pts wife aware and states that pt has prednisone at home and i give her instruction as to how to take for the tapering dose. 60mg  qd x 2 day, 40mg  qd x 2 day and 20mg  qd x 2 days. She verbalized understanding and if she needs a RX will be glad to call into pharm for him.

## 2013-05-07 NOTE — Telephone Encounter (Signed)
Pt wife called stating that pt is having severe pain today with gout flare up, says pt can not put any pressure on Left ankle and can not walk without it hurting so bad. Pt states that he was taking colcrys before and now on Uloric acid 40mg , wants to know if you can put him on that again or something better. Please advise.

## 2013-05-20 ENCOUNTER — Ambulatory Visit (INDEPENDENT_AMBULATORY_CARE_PROVIDER_SITE_OTHER): Payer: Medicare Other | Admitting: Family Medicine

## 2013-05-20 ENCOUNTER — Encounter: Payer: Self-pay | Admitting: Family Medicine

## 2013-05-20 VITALS — BP 110/48 | HR 58 | Temp 97.0°F | Resp 18 | Ht 71.0 in | Wt 208.0 lb

## 2013-05-20 DIAGNOSIS — M109 Gout, unspecified: Secondary | ICD-10-CM

## 2013-05-20 LAB — URIC ACID: URIC ACID, SERUM: 6.6 mg/dL (ref 4.0–7.8)

## 2013-05-20 NOTE — Progress Notes (Signed)
Subjective:    Patient ID: [name], male    DOB: [birth year], 68 y.o.   MRN: [id number]  HPI Is a very pleasant 68 year old African American male who is here today to followup his gout.  His last office visit, his uric acid level was found to be elevated at 9.5. He reports frequent gout attacks. In fact recently had to take prednisone for gout attack. He began the patient on uloric 40 mg poqday for prevention. He is here today to recheck uric acid level. He is also taking Januvia 25 mg by mouth daily for his diabetes. He states that cranial sugars can be in the mid 200s to 300. His last A1c was 6.9 checked in March. However this was while he was on insulin. He just recently switched her to do 25 mg and he does not believe that the medication is working effectively. Past Medical History  Diagnosis Date  . Congestive heart failure 10/2008    preserved LV systolic function   . Aortic insufficiency 09/2008    Normal left ventricular size and function : graded as mild by Doppler , but pulse pressure is widened  . 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 Hypertension   . 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 Erectile dysfunction   . Colonic polyp   . Low serum testosterone level   . Tobacco abuse   . Sleep apnea 2010    Initially unable to afford CPAP  . Benign prostatic hypertrophy   . Obesity, Class I, BMI 30-34.9   . Hyperlipidemia   . Congestive heart failure (CHF)     EF 25-30% 07/2011  . Adenomatous colon polyp 2006  . H. pylori infection 2013    treated with prevpac   Current Outpatient Prescriptions on File Prior to Visit  Medication Sig Dispense Refill  . aspirin (ASPIRIN LOW DOSE) 81 MG EC tablet Take 81 mg by mouth daily.        . cloNIDine (CATAPRES) 0.3 MG tablet Take 0.3 mg by mouth 2 (two) times daily.      . Colchicine 0.6 MG CAPS Take 0.6 mg by mouth daily.      . febuxostat  (ULORIC) 40 MG tablet Take 1 tablet (40 mg total) by mouth daily.  30 tablet  2  . hydrALAZINE (APRESOLINE) 50 MG tablet Take 50 mg by mouth 2 (two) times daily.       Marland Kitchen JANUVIA 25 MG tablet Take 25 mg by mouth daily.       . metolazone (ZAROXOLYN) 2.5 MG tablet TAKE 1 TABLET DAILY AS NEEDED FOR SWELLING OR SHORTNESS OF BREATH  30 tablet  3  . metoprolol tartrate (LOPRESSOR) 25 MG tablet Take 0.5 tablets (12.5 mg total) by mouth 2 (two) times daily.  180 tablet  3  . potassium chloride SA (K-DUR,KLOR-CON) 20 MEQ tablet Take 20 mEq by mouth daily.      Marland Kitchen torsemide (DEMADEX) 100 MG tablet Take 100 mg by mouth 2 (two) times daily.        No current facility-administered medications on file prior to visit.   Allergies  Allergen Reactions  . Ace Inhibitors     REACTION: cough   History   Social History  . Marital Status: Married    Spouse Name: N/A    Number of Children: 4  .  Years of Education: N/A   Occupational History  . Semi - Retired Administrator    Social History Main Topics  . Smoking status: Current Some Day Smoker -- 45 years    Types: Cigarettes  . Smokeless tobacco: Current User     Comment: 1-2 cigarettes OF THE NEW ELECTRIC CIGARETTES  . Alcohol Use: 2.4 oz/week    2 Glasses of wine, 2 Shots of liquor per week     Comment: occ  . Drug Use: No  . Sexual Activity: Not on file   Other Topics Concern  . Not on file   Social History Narrative  . No narrative on file      Review of Systems  All other systems reviewed and are negative.      Objective:   Physical Exam  Vitals reviewed. Cardiovascular: Normal rate, regular rhythm and normal heart sounds.   Pulmonary/Chest: Effort normal and breath sounds normal.  Abdominal: Soft. Bowel sounds are normal.          Assessment & Plan:  Gout - Plan: Uric acid  check a uric acid level today. If less than 6, continue uloric 40 mg poqday.  I have also asked the patient to bring me a log of his fasting blood  sugars and his two-hour postprandial sugars. If his fasting blood sugars are significantly higher than 130 this postprandial sugars are significantly higher than 170, I would increase Januvia 50 mg by mouth daily or possibly consider resuming insulin.

## 2013-05-29 ENCOUNTER — Encounter: Payer: Medicare Other | Admitting: Adult Health

## 2013-05-29 NOTE — Progress Notes (Signed)
ERROR rescheduled.  

## 2013-05-30 ENCOUNTER — Ambulatory Visit (INDEPENDENT_AMBULATORY_CARE_PROVIDER_SITE_OTHER): Payer: Medicare Other | Admitting: Adult Health

## 2013-05-30 ENCOUNTER — Encounter: Payer: Self-pay | Admitting: Adult Health

## 2013-05-30 VITALS — BP 140/52 | HR 60 | Ht 70.0 in | Wt 202.0 lb

## 2013-05-30 DIAGNOSIS — I359 Nonrheumatic aortic valve disorder, unspecified: Secondary | ICD-10-CM

## 2013-05-30 DIAGNOSIS — I10 Essential (primary) hypertension: Secondary | ICD-10-CM

## 2013-05-30 DIAGNOSIS — I5022 Chronic systolic (congestive) heart failure: Secondary | ICD-10-CM

## 2013-05-30 DIAGNOSIS — I509 Heart failure, unspecified: Secondary | ICD-10-CM

## 2013-05-30 MED ORDER — METOPROLOL SUCCINATE ER 25 MG PO TB24: 25.0000 mg | ORAL_TABLET | Freq: Every day | ORAL | Status: DC

## 2013-05-30 MED ORDER — POTASSIUM CHLORIDE CRYS ER 20 MEQ PO TBCR: 20.0000 meq | EXTENDED_RELEASE_TABLET | Freq: Two times a day (BID) | ORAL | Status: DC

## 2013-05-30 NOTE — Progress Notes (Deleted)
Name: [name]    DOB: 07-26-1945  Age: 68 y.o.  MR#: [id number]       PCP:  Odette Fraction, MD      Insurance: Payor: [employer] MEDICARE / Plan: AARP MEDICARE COMPLETE / Product Type: *No Product type* /   CC:    Chief Complaint  Patient presents with  . Hypertension  . Congestive Heart Failure    VS Filed Vitals:   05/30/13 1515  BP: 140/52  Pulse: 60  Height: 5\' 10"  (1.778 m)  Weight: 202 lb (91.627 kg)    Weights Current Weight  05/30/13 202 lb (91.627 kg)  05/20/13 208 lb (94.348 kg)  04/09/13 202 lb (91.627 kg)    Blood Pressure  BP Readings from Last 3 Encounters:  05/30/13 140/52  05/20/13 110/48  04/09/13 138/50     Admit date:  (Not on file) Last encounter with RMR:  02/11/2013   Allergy Ace inhibitors  Current Outpatient Prescriptions  Medication Sig Dispense Refill  . aspirin (ASPIRIN LOW DOSE) 81 MG EC tablet Take 81 mg by mouth daily.        . Colchicine 0.6 MG CAPS Take 0.6 mg by mouth daily.      . febuxostat (ULORIC) 40 MG tablet Take 1 tablet (40 mg total) by mouth daily.  30 tablet  2  . hydrALAZINE (APRESOLINE) 50 MG tablet Take 50 mg by mouth 2 (two) times daily.       Marland Kitchen JANUVIA 25 MG tablet Take 25 mg by mouth daily.       . metolazone (ZAROXOLYN) 2.5 MG tablet TAKE 1 TABLET DAILY AS NEEDED FOR SWELLING OR SHORTNESS OF BREATH  30 tablet  3  . metoprolol tartrate (LOPRESSOR) 25 MG tablet Take 0.5 tablets (12.5 mg total) by mouth 2 (two) times daily.  180 tablet  3  . potassium chloride SA (K-DUR,KLOR-CON) 20 MEQ tablet Take 20 mEq by mouth daily.      Marland Kitchen torsemide (DEMADEX) 100 MG tablet Take 100 mg by mouth 2 (two) times daily.       . cloNIDine (CATAPRES) 0.3 MG tablet Take 0.3 mg by mouth 2 (two) times daily.       No current facility-administered medications for this visit.    Discontinued Meds:   There are no discontinued medications.  Patient Active Problem List   Diagnosis Date Noted  . Chronic kidney disease, stage  IV (severe) 09/03/2012  . CKD (chronic kidney disease) stage 4, GFR 15-29 ml/min 08/14/2011  . Moderate mitral regurgitation 08/12/2011  . Cholelithiasis 08/10/2011  . Moderate dehydration 07/25/2011  . Chronic systolic congestive heart failure 07/21/2011  . Dyspnea 07/19/2011  . COPD (chronic obstructive pulmonary disease) 07/18/2011  . Pulmonary nodule 07/14/2011  . Poor appetite 07/12/2011  . Change in bowel movement 07/12/2011  . Syncope 05/12/2011  . Sinusitis 05/12/2011  . Ankle pain, left 02/03/2011  . ED (erectile dysfunction) 11/02/2010  . Hyperlipidemia 05/22/2010  . Aortic valve disease 04/16/2010  . Chronic kidney disease, stage 4, severely decreased GFR 04/16/2010  . SLEEP APNEA 01/14/2010  . DEPRESSION 06/29/2009  . BENIGN PROSTATIC HYPERTROPHY, HX OF 03/02/2009  . DEPENDENT EDEMA, LEGS 02/18/2008  . DIABETES MELLITUS, TYPE II, WITH RETINOPATHY 02/23/2007  . TOBACCO ABUSE 02/23/2007  . HYPERTENSION 02/23/2007    LABS    Component Value Date/Time   NA 134* 04/16/2013 0809   NA 135 06/15/2012 1506   NA 137 08/10/2011 1143   K 2.8* 04/16/2013 0809  K 3.1* 06/15/2012 1506   K 3.5 08/10/2011 1143   CL 93* 04/16/2013 0809   CL 98 06/15/2012 1506   CL 95* 08/10/2011 1143   CO2 28 04/16/2013 0809   CO2 26 06/15/2012 1506   CO2 30 08/10/2011 1143   GLUCOSE 138* 04/16/2013 0809   GLUCOSE 136* 06/15/2012 1506   GLUCOSE 96 08/10/2011 1143   BUN 104* 04/16/2013 0809   BUN 65* 06/15/2012 1506   BUN 59* 08/10/2011 1143   CREATININE 4.65* 04/16/2013 0809   CREATININE 3.84* 06/15/2012 1506   CREATININE 4.55* 08/10/2011 1143   CREATININE 3.58* 07/21/2011 0629   CREATININE 3.28* 07/20/2011 0316   CREATININE 3.25* 07/19/2011 0601   CALCIUM 9.5 04/16/2013 0809   CALCIUM 9.1 06/15/2012 1506   CALCIUM 9.6 08/10/2011 1143   GFRNONAA 16* 07/21/2011 0629   GFRNONAA 18* 07/20/2011 0316   GFRNONAA 18* 07/19/2011 0601   GFRNONAA 10* 05/12/2011 0934   GFRAA 19* 07/21/2011 0629   GFRAA 21* 07/20/2011 0316    GFRAA 21* 07/19/2011 0601   GFRAA 12* 05/12/2011 0934   CMP     Component Value Date/Time   NA 134* 04/16/2013 0809   K 2.8* 04/16/2013 0809   CL 93* 04/16/2013 0809   CO2 28 04/16/2013 0809   GLUCOSE 138* 04/16/2013 0809   BUN 104* 04/16/2013 0809   CREATININE 4.65* 04/16/2013 0809   CREATININE 3.58* 07/21/2011 0629   CALCIUM 9.5 04/16/2013 0809   PROT 6.5 06/15/2012 1506   ALBUMIN 3.7 06/15/2012 1506   AST 13 06/15/2012 1506   ALT 9 06/15/2012 1506   ALKPHOS 81 06/15/2012 1506   BILITOT 0.4 06/15/2012 1506   GFRNONAA 16* 07/21/2011 0629   GFRNONAA 10* 05/12/2011 0934   GFRAA 19* 07/21/2011 0629   GFRAA 12* 05/12/2011 0934       Component Value Date/Time   WBC 7.7 07/19/2011 0601   WBC 6.4 07/15/2011 1050   WBC 14.3* 05/12/2011 0934   HGB 11.1* 07/19/2011 0601   HGB 10.3* 07/15/2011 1050   HGB 8.1* 05/12/2011 0934   HCT 32.8* 07/19/2011 0601   HCT 30.5* 07/15/2011 1050   HCT 26.1* 05/12/2011 0934   MCV 92.1 07/19/2011 0601   MCV 91.9 07/15/2011 1050   MCV 87.9 05/12/2011 0934    Lipid Panel     Component Value Date/Time   CHOL 171 05/01/2011 0055   TRIG 90 05/01/2011 0055   HDL 57 05/01/2011 0055   CHOLHDL 3.0 05/01/2011 0055   VLDL 18 05/01/2011 0055   LDLCALC 96 05/01/2011 0055    ABG    Component Value Date/Time   PHART 7.414 11/17/2008 2330   PCO2ART 39.9 11/17/2008 2330   PO2ART 79.5* 11/17/2008 2330   HCO3 25.1* 11/17/2008 2330   TCO2 22.6 11/17/2008 2330   ACIDBASEDEF 1.8 11/11/2008 0215   O2SAT 95.7 11/17/2008 2330     Lab Results  Component Value Date   TSH 1.180 07/19/2011   BNP (last 3 results) No results found for this basename: PROBNP,  in the last 8760 hours Cardiac Panel (last 3 results) No results found for this basename: CKTOTAL, CKMB, TROPONINI, RELINDX,  in the last 72 hours  Iron/TIBC/Ferritin No results found for this basename: iron, tibc, ferritin     EKG Orders placed in visit on 07/11/12  . EKG 12-LEAD     Prior Assessment and Plan Problem List as of 05/30/2013      Cardiovascular and Mediastinum   HYPERTENSION   Last Assessment &  Plan   02/11/2013 Office Visit Written 02/11/2013  1:40 PM by Lendon Colonel, NP     BP is well controlled currently. No changes in his meds.  He will follow up with Dr. Bronson Ing for establishment with cardiologist.    Aortic valve disease   Last Assessment & Plan   06/15/2012 Office Visit Edited 06/15/2012  4:37 PM by Lendon Colonel, NP     He had echocardiogram in July of 2013 demonstrating mild AI, despite loud murmur. Systolic Dysfunction was also noted. Will consider repeating Echo on next visit for evaluation of improvement of LVEF. He is on appropriate medications with the exception of BB due to COPD.    Syncope   Last Assessment & Plan   08/12/2011 Office Visit Written 08/12/2011 10:21 AM by Renella Cunas, MD     No recurrence. He is still mildly orthostatic but his heart rate increases with the lower dose of labetalol. I still think he severely volume depleted. I decreased his torsemide to once a day with careful watching daily weights. If he becomes short of breath, developed swelling, or his weight exceeds greater than 3 pounds, he will go back to twice a day torsemide at that time. While he is taking it once a day we have cut his potassium to once a day. If he takes it twice a day he will take 2 potassiums. I'll see him back for close followup in about 3 weeks. Orthostatic precautions given.    Chronic systolic congestive heart failure   Last Assessment & Plan   02/11/2013 Office Visit Written 02/11/2013  1:39 PM by Lendon Colonel, NP     No evidence of fluid retention. He continues on demedex. He is to continue on current medications. I will check a BMET for evaluation of kidney status. I will send copies to Dr.s Legrand Rams and [place].     Moderate mitral regurgitation     Respiratory   Sinusitis   Last Assessment & Plan   05/12/2011 Office Visit Written 05/15/2011  9:26 AM by Fayrene Helper, MD     Sinus  congestion with elevated WBC  And a shift, will treat with antibiotic, pt aware    COPD (chronic obstructive pulmonary disease)   Last Assessment & Plan   07/18/2011 Office Visit Edited 07/20/2011 12:42 PM by Fayrene Helper, MD      Acute exaccerbation of symptoms aggressive anti inflammatory        Digestive   Cholelithiasis   Last Assessment & Plan   07/12/2011 Office Visit Written 08/10/2011  8:38 AM by Fayrene Helper, MD     Pt symptomatic with weight loss      Endocrine   DIABETES MELLITUS, TYPE II, WITH RETINOPATHY   Last Assessment & Plan   07/11/2012 Office Visit Written 07/11/2012 10:41 AM by Lendon Colonel, NP     Noted foot ulcers on the tops of both feet at the 3rd metatarsal, with skin color changes on the right middle toe, with erythema. Will start Keflex 500mg  BID for 10 days. He is referred to podiatrist for ongoing evaluation. Good arterial pulses.      Genitourinary   Chronic kidney disease, stage 4, severely decreased GFR   Last Assessment & Plan   02/01/2012 Office Visit Edited 02/01/2012  2:47 PM by Imogene Burn, PA     His creatinine was 4.55 in July 2013. We will repeat this today. He has an appointment with Dr. Mercy Moore,  [place] [facility] on February 14 2012    ED (erectile dysfunction)   Last Assessment & Plan   02/03/2011 Office Visit Written 02/06/2011  2:53 PM by Fayrene Helper, MD     Persistent and a source of some depression, at one time expressed interest in testosterone supplement if indicated, however , when I explained that this would be lifelong , he declined testing    CKD (chronic kidney disease) stage 4, GFR 15-29 ml/min   Last Assessment & Plan   02/11/2013 Office Visit Written 02/11/2013  1:39 PM by Lendon Colonel, NP     Defer to Dr. Hinda Lenis.     Chronic kidney disease, stage IV (severe)     Other   TOBACCO ABUSE   Last Assessment & Plan   07/18/2011 Office Visit Written 07/20/2011 12:15 PM by Fayrene Helper, MD      States quit x 5 days, has copd and is at high risk for lung Ca based on smoking history, chest scan ordered, cesation counselling done    DEPRESSION   DEPENDENT EDEMA, LEGS   Last Assessment & Plan   09/06/2011 Office Visit Written 09/06/2011  3:09 PM by Renella Cunas, MD     Much improved.    BENIGN PROSTATIC HYPERTROPHY, HX OF   SLEEP APNEA   Last Assessment & Plan   08/14/2012 Office Visit Written 08/14/2012  2:06 PM by Lendon Colonel, NP     Encouraged to use CPAP as directed.    Hyperlipidemia   Last Assessment & Plan   08/01/2011 Office Visit Written 08/14/2011  4:15 PM by Fayrene Helper, MD     Hyperlipidemia:Low fat diet discussed and encouraged.  Controlled, no change in medication     Ankle pain, left   Last Assessment & Plan   02/03/2011 Office Visit Written 02/06/2011  2:52 PM by Fayrene Helper, MD     Acute flair, short indocid taper    Poor appetite   Last Assessment & Plan   07/12/2011 Office Visit Written 08/10/2011  8:29 AM by Fayrene Helper, MD     Symptomatic gallstones, will refer to surgery for further eval. May need cholecystectomy     Change in bowel movement   Pulmonary nodule   Last Assessment & Plan   07/18/2011 Office Visit Written 07/20/2011 12:15 PM by Fayrene Helper, MD     Nicotine x 40 plus years, needs chest Ct    Dyspnea   Moderate dehydration   Last Assessment & Plan   07/25/2011 Office Visit Written 07/25/2011  4:33 PM by Renella Cunas, MD     He is clearly prerenal. I've advised him to stop his metolazone. His weight range to be  237-43. He will weigh himself daily. I've asked him to drink more water the next few days until weight up  4 pounds. We talked about how to avoid syncope. I discontinued his Hytrin since he started on an alpha-blocker and he has systolic heart failure now. In addition he does not have a prostate issue.  His weight is 02/18/1943 he will take metolazone on a when necessary basis. Otherwise he will stay on  Demadex 100 twice a day.  I have him followup with me on July 22 were close followup. We'll check electrolytes then. I am sure his BUN and creatinine are extremely high again.        Imaging: No results found.      Marland Kitchen

## 2013-05-30 NOTE — Patient Instructions (Addendum)
Your physician recommends that you schedule a follow-up appointment in: 6 months with Dr Virgina Jock will receive a reminder letter two months in advance reminding you to call and schedule your appointment. If you don't receive this letter, please contact our office.  Your physician has recommended you make the following change in your medication:   STOP LOPRESSOR   START TOPROL XL 25 mg daily  Increased Potassium to 20 mEQ twice a day  Please take an extra dose of Potassium when you take a metolazone as needed.  Your physician recommends that you return for lab work in 2 weeks . BMET

## 2013-05-30 NOTE — Assessment & Plan Note (Signed)
No evidence of fluid overload is noted today. I have advised him to take the potassium twice a day as directed. If he also has to take an additional metolazone due to fluid retention, he is to take an additional potassium as well. He will followup with the BMET in a week for reevaluation of his potassium status.  Of note, he is to be on metoprolol tartrate 12.5 mg twice a day. He is only taking one tablet a day. I have changed him to metoprolol XL 25 mg daily. His palpitations and rapid heart rate may be a combination of hypo-kalemia and only using 1 short acting beta blocker dose a day.  He will see [place] again in 6 months unless he becomes symptomatic, or if the lab work reveals significant abnormalities.

## 2013-05-30 NOTE — Progress Notes (Signed)
HPI: [name] we are seeing patient to be assigned to Dr. Pennelope Bracken we are following for ongoing assessment and management of chronic systolic and diastolic CHF, orthostatic hypotension and syncope due to dehydration.   Other history includes chronic kidney disease. Most recent echocardiogram demonstrated LVEF approximately 60% with mild basal inferior hypokinesis. Grade 1 diastolic dysfunction. Last in the office in January 2015 and was without complaint. He continues fllareups of gout and is followed by Dr. Lowanda Foster and Dr. Dorris Fetch endocrinologist.  He comes today with complaints of rapid palpitations that have been occurring several times a week, non-painful or symptomatic but more noticeable. He is medically compliant. He occasionally has to take an extra metolazone for weight gain and fluid retention. He has had recent labs by his nephrologist where he was found to be hypokalemic, and was advised to increase his potassium dosage from 20 mEq daily to 20 mEq twice a day. He is not yet done so without checking with [place]. Otherwise he is doing well.     Allergies  Allergen Reactions  . Ace Inhibitors     REACTION: cough    Current Outpatient Prescriptions  Medication Sig Dispense Refill  . aspirin (ASPIRIN LOW DOSE) 81 MG EC tablet Take 81 mg by mouth daily.        . Colchicine 0.6 MG CAPS Take 0.6 mg by mouth daily.      . febuxostat (ULORIC) 40 MG tablet Take 1 tablet (40 mg total) by mouth daily.  30 tablet  2  . hydrALAZINE (APRESOLINE) 50 MG tablet Take 50 mg by mouth 2 (two) times daily.       Marland Kitchen JANUVIA 25 MG tablet Take 25 mg by mouth daily.       . metolazone (ZAROXOLYN) 2.5 MG tablet TAKE 1 TABLET DAILY AS NEEDED FOR SWELLING OR SHORTNESS OF BREATH  30 tablet  3  . potassium chloride SA (K-DUR,KLOR-CON) 20 MEQ tablet Take 1 tablet (20 mEq total) by mouth 2 (two) times daily. And as directed  75 tablet  6  . torsemide (DEMADEX) 100 MG tablet Take 100 mg by mouth 2 (two) times  daily.       . cloNIDine (CATAPRES) 0.3 MG tablet Take 0.3 mg by mouth 2 (two) times daily.      . metoprolol succinate (TOPROL XL) 25 MG 24 hr tablet Take 1 tablet (25 mg total) by mouth daily.  30 tablet  6   No current facility-administered medications for this visit.    Past Medical History  Diagnosis Date  . Congestive heart failure 10/2008    preserved LV systolic function   . Aortic insufficiency 09/2008    Normal left ventricular size and function : graded as mild by Doppler , but pulse pressure is widened  . 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 Hypertension   . 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 Erectile dysfunction   . Colonic polyp   . Low serum testosterone level   . Tobacco abuse   . Sleep apnea 2010    Initially unable to afford CPAP  . Benign prostatic hypertrophy   . Obesity, Class I, BMI 30-34.9   . Hyperlipidemia   . Congestive heart failure (CHF)     EF 25-30% 07/2011  . Adenomatous colon polyp 2006  . H. pylori infection 2013  treated with prevpac    Past Surgical History  Procedure Laterality Date  . Retinal laser procedure      diabetic retinopathy  . Lymph node biopsy      surgical exploration of neck-not entirely clear that this represented a lymph node biopsy  . Colonoscopy  08/23/2004    Dr. Vivi Ferns rectum, diminutive polyp of the rectosigmoid removed, inflamed focally adenomatous polyp    ROS: Review of systems complete and found to be negative unless listed above  PHYSICAL EXAM BP 140/52  Pulse 60  Ht 5\' 10"  (1.778 m)  Wt 202 lb (91.627 kg)  BMI 28.98 kg/m2 General: Well developed, well nourished, in no acute distress Head: Eyes PERRLA, No xanthomas.   Normal cephalic and atramatic  Lungs: Clear bilaterally to auscultation and percussion. Heart: HRRR S1 S2, without MRG.  Pulses are 2+ & equal.            No carotid bruit. No JVD.  No  abdominal bruits. No femoral bruits. Abdomen: Bowel sounds are positive, abdomen soft and non-tender without masses or                  Hernia's noted. Msk:  Back normal, normal gait. Normal strength and tone for age. Extremities: No clubbing, cyanosis or edema.  DP +1 Neuro: Alert and oriented X 3. Psych:  Good affect, responds appropriately     ASSESSMENT AND PLAN

## 2013-05-30 NOTE — Assessment & Plan Note (Signed)
Continue to follow this. No intervention at this time. If he becomes symptomatic we will repeat echo.

## 2013-05-30 NOTE — Assessment & Plan Note (Signed)
Blood pressure is moderately controlled at 142/52 today. With change in medication to long-acting metoprolol this will help with overall blood pressure control. He is to continue hydralazine 50 mg twice a day, and torsemide as directed. Catapres is also on board at 0.3 mg twice a day. He will continue to followup with nephrology for ongoing management of chronic kidney disease associated with hypertension.

## 2013-07-09 ENCOUNTER — Ambulatory Visit (INDEPENDENT_AMBULATORY_CARE_PROVIDER_SITE_OTHER): Payer: Medicare Other | Admitting: Family Medicine

## 2013-07-09 ENCOUNTER — Encounter: Payer: Self-pay | Admitting: Family Medicine

## 2013-07-09 VITALS — BP 130/40 | HR 58 | Temp 97.3°F | Resp 12 | Ht 71.0 in | Wt 194.0 lb

## 2013-07-09 DIAGNOSIS — E119 Type 2 diabetes mellitus without complications: Secondary | ICD-10-CM

## 2013-07-09 LAB — COMPLETE METABOLIC PANEL WITH GFR
ALBUMIN: 4.2 g/dL (ref 3.5–5.2)
ALK PHOS: 53 U/L (ref 39–117)
ALT: <8 U/L (ref 0–53)
AST: 10 U/L (ref 0–37)
BUN: 108 mg/dL — AB (ref 6–23)
CALCIUM: 9.4 mg/dL (ref 8.4–10.5)
CHLORIDE: 96 meq/L (ref 96–112)
CO2: 25 mEq/L (ref 19–32)
Creat: 5.09 mg/dL — ABNORMAL HIGH (ref 0.50–1.35)
GFR, Est African American: 12 mL/min — ABNORMAL LOW
GFR, Est Non African American: 11 mL/min — ABNORMAL LOW
GLUCOSE: 102 mg/dL — AB (ref 70–99)
POTASSIUM: 3.2 meq/L — AB (ref 3.5–5.3)
SODIUM: 137 meq/L (ref 135–145)
TOTAL PROTEIN: 6.6 g/dL (ref 6.0–8.3)
Total Bilirubin: 0.4 mg/dL (ref 0.2–1.2)

## 2013-07-09 LAB — HEMOGLOBIN A1C
Hgb A1c MFr Bld: 6.7 % — ABNORMAL HIGH (ref <5.7)
Mean Plasma Glucose: 146 mg/dL — ABNORMAL HIGH (ref <117)

## 2013-07-09 LAB — LIPID PANEL
CHOL/HDL RATIO: 2.2 ratio
CHOLESTEROL: 140 mg/dL (ref 0–200)
HDL: 63 mg/dL (ref >39)
LDL Cholesterol: 65 mg/dL (ref 0–99)
Triglycerides: 58 mg/dL (ref <150)
VLDL: 12 mg/dL (ref 0–40)

## 2013-07-09 NOTE — Progress Notes (Signed)
Subjective:    Patient ID: [name], male    DOB: [birth year], 68 y.o.   MRN: [id number]  HPI Patient is here today to discuss his diabetes.  He is not taking Januvia as listed on his medical record. He states he stopped that medication after only one week. Instead he is been using Lantus 10 units subcutaneous each bedtime which he has been obtaining from a friend. He is been taking Lantus for several months. He denies any hypoglycemic episodes. He states his fasting blood sugars in the morning range 90-110. History of postprandial sugars in the evening are usually less than 200. He is overdue for hemoglobin A1c along with a fasting lipid panel. Past Medical History  Diagnosis Date  . Congestive heart failure 10/2008    preserved LV systolic function   . Aortic insufficiency 09/2008    Normal left ventricular size and function : graded as mild by Doppler , but pulse pressure is widened  . 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 Hypertension   . 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 Erectile dysfunction   . Colonic polyp   . Low serum testosterone level   . Tobacco abuse   . Sleep apnea 2010    Initially unable to afford CPAP  . Benign prostatic hypertrophy   . Obesity, Class I, BMI 30-34.9   . Hyperlipidemia   . Congestive heart failure (CHF)     EF 25-30% 07/2011  . Adenomatous colon polyp 2006  . H. pylori infection 2013    treated with prevpac   Current Outpatient Prescriptions on File Prior to Visit  Medication Sig Dispense Refill  . aspirin (ASPIRIN LOW DOSE) 81 MG EC tablet Take 81 mg by mouth daily.        . Colchicine 0.6 MG CAPS Take 0.6 mg by mouth daily.      . febuxostat (ULORIC) 40 MG tablet Take 1 tablet (40 mg total) by mouth daily.  30 tablet  2  . hydrALAZINE (APRESOLINE) 50 MG tablet Take 50 mg by mouth 2 (two) times daily.       Marland Kitchen JANUVIA 25 MG tablet Take 25 mg by  mouth daily.       . metolazone (ZAROXOLYN) 2.5 MG tablet TAKE 1 TABLET DAILY AS NEEDED FOR SWELLING OR SHORTNESS OF BREATH  30 tablet  3  . metoprolol succinate (TOPROL XL) 25 MG 24 hr tablet Take 1 tablet (25 mg total) by mouth daily.  30 tablet  6  . potassium chloride SA (K-DUR,KLOR-CON) 20 MEQ tablet Take 1 tablet (20 mEq total) by mouth 2 (two) times daily. And as directed  75 tablet  6  . torsemide (DEMADEX) 100 MG tablet Take 100 mg by mouth 2 (two) times daily.       . cloNIDine (CATAPRES) 0.3 MG tablet Take 0.3 mg by mouth 2 (two) times daily.       No current facility-administered medications on file prior to visit.   Allergies  Allergen Reactions  . Ace Inhibitors     REACTION: cough   History   Social History  . Marital Status: Married    Spouse Name: N/A    Number of Children: 4  . Years of Education: N/A   Occupational History  . Semi - Retired Administrator    Social History Main Topics  .  Smoking status: Current Some Day Smoker -- 45 years    Types: Cigarettes  . Smokeless tobacco: Current User     Comment: 1-2 cigarettes OF THE NEW ELECTRIC CIGARETTES  . Alcohol Use: 2.4 oz/week    2 Glasses of wine, 2 Shots of liquor per week     Comment: occ  . Drug Use: No  . Sexual Activity: Not on file   Other Topics Concern  . Not on file   Social History Narrative  . No narrative on file      Review of Systems  All other systems reviewed and are negative.      Objective:   Physical Exam  Vitals reviewed. Cardiovascular: Normal rate, regular rhythm and normal heart sounds.   No murmur heard. Pulmonary/Chest: Effort normal and breath sounds normal. No respiratory distress. He has no wheezes. He has no rales.  Abdominal: Soft. Bowel sounds are normal. He exhibits no distension. There is no tenderness. There is no rebound and no guarding.  Musculoskeletal: He exhibits no edema.          Assessment & Plan:  1. Type II or unspecified type diabetes  mellitus without mention of complication, not stated as uncontrolled Check a hemoglobin A1c. The goal hemoglobin A1c for this individual is less than 7. If greater than 7, I will likely increase Lantus to 15 units qday. Discontinue Januvia on his medicine list. - Hemoglobin A1c - COMPLETE METABOLIC PANEL WITH GFR - Lipid panel

## 2013-07-11 ENCOUNTER — Other Ambulatory Visit: Payer: Self-pay | Admitting: *Deleted

## 2013-07-11 MED ORDER — INSULIN PEN NEEDLE 31G X 5 MM MISC: Status: DC

## 2013-07-11 MED ORDER — INSULIN GLARGINE 100 UNIT/ML SOLOSTAR PEN: 10.0000 [IU] | PEN_INJECTOR | Freq: Every day | SUBCUTANEOUS | Status: DC

## 2013-07-15 ENCOUNTER — Encounter: Payer: Self-pay | Admitting: Adult Health

## 2013-07-15 ENCOUNTER — Ambulatory Visit (INDEPENDENT_AMBULATORY_CARE_PROVIDER_SITE_OTHER): Payer: Medicare Other | Admitting: Adult Health

## 2013-07-15 VITALS — BP 132/50 | HR 55 | Ht 69.0 in | Wt 195.0 lb

## 2013-07-15 DIAGNOSIS — I10 Essential (primary) hypertension: Secondary | ICD-10-CM

## 2013-07-15 DIAGNOSIS — I359 Nonrheumatic aortic valve disorder, unspecified: Secondary | ICD-10-CM

## 2013-07-15 DIAGNOSIS — N184 Chronic kidney disease, stage 4 (severe): Secondary | ICD-10-CM

## 2013-07-15 DIAGNOSIS — R079 Chest pain, unspecified: Secondary | ICD-10-CM

## 2013-07-15 NOTE — Assessment & Plan Note (Signed)
He is asymptomatic currently for shortness of breath. I think some of the discomfort is more muscle spasm vs. cardiac pain.

## 2013-07-15 NOTE — Progress Notes (Deleted)
Name: [name]    DOB: Apr 24, 1945  Age: 68 y.o.  MR#: [id number]       PCP:  Odette Fraction, MD      Insurance: Payor: [employer] MEDICARE / Plan: AARP MEDICARE COMPLETE / Product Type: *No Product type* /   CC:   No chief complaint on file.   VS Filed Vitals:   07/15/13 1509  BP: 132/50  Pulse: 55  Height: 5\' 9"  (1.753 m)  Weight: 195 lb (88.451 kg)  SpO2: 97%    Weights Current Weight  07/15/13 195 lb (88.451 kg)  07/09/13 194 lb (87.998 kg)  05/30/13 202 lb (91.627 kg)    Blood Pressure  BP Readings from Last 3 Encounters:  07/15/13 132/50  07/09/13 130/40  05/30/13 140/52     Admit date:  (Not on file) Last encounter with RMR:  05/30/2013   Allergy Ace inhibitors  Current Outpatient Prescriptions  Medication Sig Dispense Refill  . aspirin (ASPIRIN LOW DOSE) 81 MG EC tablet Take 81 mg by mouth daily.        . Colchicine 0.6 MG CAPS Take 0.6 mg by mouth daily.      . febuxostat (ULORIC) 40 MG tablet Take 1 tablet (40 mg total) by mouth daily.  30 tablet  2  . hydrALAZINE (APRESOLINE) 50 MG tablet Take 50 mg by mouth 2 (two) times daily.       . Insulin Glargine (LANTUS SOLOSTAR) 100 UNIT/ML Solostar Pen Inject 10 Units into the skin daily at 10 pm.  5 pen  PRN  . Insulin Pen Needle 31G X 5 MM MISC Use as directed.  50 each  0  . metolazone (ZAROXOLYN) 2.5 MG tablet TAKE 1 TABLET DAILY AS NEEDED FOR SWELLING OR SHORTNESS OF BREATH  30 tablet  3  . metoprolol succinate (TOPROL XL) 25 MG 24 hr tablet Take 1 tablet (25 mg total) by mouth daily.  30 tablet  6  . potassium chloride SA (K-DUR,KLOR-CON) 20 MEQ tablet Take 1 tablet (20 mEq total) by mouth 2 (two) times daily. And as directed  75 tablet  6  . torsemide (DEMADEX) 100 MG tablet Take 100 mg by mouth 2 (two) times daily.       . cloNIDine (CATAPRES) 0.3 MG tablet Take 0.3 mg by mouth 2 (two) times daily.       No current facility-administered medications for this visit.    Discontinued Meds:  There are no discontinued medications.  Patient Active Problem List   Diagnosis Date Noted  . CKD (chronic kidney disease) stage 4, GFR 15-29 ml/min 08/14/2011  . Moderate mitral regurgitation 08/12/2011  . Cholelithiasis 08/10/2011  . Moderate dehydration 07/25/2011  . Chronic systolic congestive heart failure 07/21/2011  . Dyspnea 07/19/2011  . COPD (chronic obstructive pulmonary disease) 07/18/2011  . Pulmonary nodule 07/14/2011  . Poor appetite 07/12/2011  . Change in bowel movement 07/12/2011  . Syncope 05/12/2011  . Sinusitis 05/12/2011  . Ankle pain, left 02/03/2011  . ED (erectile dysfunction) 11/02/2010  . Aortic valve disease 04/16/2010  . Chronic kidney disease, stage 4, severely decreased GFR 04/16/2010  . SLEEP APNEA 01/14/2010  . DEPRESSION 06/29/2009  . BENIGN PROSTATIC HYPERTROPHY, HX OF 03/02/2009  . DEPENDENT EDEMA, LEGS 02/18/2008  . DIABETES MELLITUS, TYPE II, WITH RETINOPATHY 02/23/2007  . TOBACCO ABUSE 02/23/2007  . HYPERTENSION 02/23/2007    LABS    Component Value Date/Time   NA 137 07/09/2013 0902   NA 134* 04/16/2013 0809  NA 135 06/15/2012 1506   K 3.2* 07/09/2013 0902   K 2.8* 04/16/2013 0809   K 3.1* 06/15/2012 1506   CL 96 07/09/2013 0902   CL 93* 04/16/2013 0809   CL 98 06/15/2012 1506   CO2 25 07/09/2013 0902   CO2 28 04/16/2013 0809   CO2 26 06/15/2012 1506   GLUCOSE 102* 07/09/2013 0902   GLUCOSE 138* 04/16/2013 0809   GLUCOSE 136* 06/15/2012 1506   BUN 108* 07/09/2013 0902   BUN 104* 04/16/2013 0809   BUN 65* 06/15/2012 1506   CREATININE 5.09* 07/09/2013 0902   CREATININE 4.65* 04/16/2013 0809   CREATININE 3.84* 06/15/2012 1506   CREATININE 3.58* 07/21/2011 0629   CREATININE 3.28* 07/20/2011 0316   CREATININE 3.25* 07/19/2011 0601   CALCIUM 9.4 07/09/2013 0902   CALCIUM 9.5 04/16/2013 0809   CALCIUM 9.1 06/15/2012 1506   GFRNONAA 11* 07/09/2013 0902   GFRNONAA 16* 07/21/2011 0629   GFRNONAA 18* 07/20/2011 0316   GFRNONAA 18* 07/19/2011 0601   GFRNONAA 10*  05/12/2011 0934   GFRAA 12* 07/09/2013 0902   GFRAA 19* 07/21/2011 0629   GFRAA 21* 07/20/2011 0316   GFRAA 21* 07/19/2011 0601   GFRAA 12* 05/12/2011 0934   CMP     Component Value Date/Time   NA 137 07/09/2013 0902   K 3.2* 07/09/2013 0902   CL 96 07/09/2013 0902   CO2 25 07/09/2013 0902   GLUCOSE 102* 07/09/2013 0902   BUN 108* 07/09/2013 0902   CREATININE 5.09* 07/09/2013 0902   CREATININE 3.58* 07/21/2011 0629   CALCIUM 9.4 07/09/2013 0902   PROT 6.6 07/09/2013 0902   ALBUMIN 4.2 07/09/2013 0902   AST 10 07/09/2013 0902   ALT <8 07/09/2013 0902   ALKPHOS 53 07/09/2013 0902   BILITOT 0.4 07/09/2013 0902   GFRNONAA 11* 07/09/2013 0902   GFRNONAA 16* 07/21/2011 0629   GFRAA 12* 07/09/2013 0902   GFRAA 19* 07/21/2011 0629       Component Value Date/Time   WBC 7.7 07/19/2011 0601   WBC 6.4 07/15/2011 1050   WBC 14.3* 05/12/2011 0934   HGB 11.1* 07/19/2011 0601   HGB 10.3* 07/15/2011 1050   HGB 8.1* 05/12/2011 0934   HCT 32.8* 07/19/2011 0601   HCT 30.5* 07/15/2011 1050   HCT 26.1* 05/12/2011 0934   MCV 92.1 07/19/2011 0601   MCV 91.9 07/15/2011 1050   MCV 87.9 05/12/2011 0934    Lipid Panel     Component Value Date/Time   CHOL 140 07/09/2013 0902   TRIG 58 07/09/2013 0902   HDL 63 07/09/2013 0902   CHOLHDL 2.2 07/09/2013 0902   VLDL 12 07/09/2013 0902   LDLCALC 65 07/09/2013 0902    ABG    Component Value Date/Time   PHART 7.414 11/17/2008 2330   PCO2ART 39.9 11/17/2008 2330   PO2ART 79.5* 11/17/2008 2330   HCO3 25.1* 11/17/2008 2330   TCO2 22.6 11/17/2008 2330   ACIDBASEDEF 1.8 11/11/2008 0215   O2SAT 95.7 11/17/2008 2330     Lab Results  Component Value Date   TSH 1.180 07/19/2011   BNP (last 3 results) No results found for this basename: PROBNP,  in the last 8760 hours Cardiac Panel (last 3 results) No results found for this basename: CKTOTAL, CKMB, TROPONINI, RELINDX,  in the last 72 hours  Iron/TIBC/Ferritin No results found for this basename: iron, tibc, ferritin     EKG Orders placed in  visit on 07/11/12  . EKG 12-LEAD  Prior Assessment and Plan Problem List as of 07/15/2013     Cardiovascular and Mediastinum   HYPERTENSION   Last Assessment & Plan   05/30/2013 Office Visit Written 05/30/2013  4:30 PM by Lendon Colonel, NP     Blood pressure is moderately controlled at 142/52 today. With change in medication to long-acting metoprolol this will help with overall blood pressure control. He is to continue hydralazine 50 mg twice a day, and torsemide as directed. Catapres is also on board at 0.3 mg twice a day. He will continue to followup with nephrology for ongoing management of chronic kidney disease associated with hypertension.    Aortic valve disease   Last Assessment & Plan   05/30/2013 Office Visit Written 05/30/2013  4:31 PM by Lendon Colonel, NP     Continue to follow this. No intervention at this time. If he becomes symptomatic we will repeat echo.    Syncope   Last Assessment & Plan   08/12/2011 Office Visit Written 08/12/2011 10:21 AM by Renella Cunas, MD     No recurrence. He is still mildly orthostatic but his heart rate increases with the lower dose of labetalol. I still think he severely volume depleted. I decreased his torsemide to once a day with careful watching daily weights. If he becomes short of breath, developed swelling, or his weight exceeds greater than 3 pounds, he will go back to twice a day torsemide at that time. While he is taking it once a day we have cut his potassium to once a day. If he takes it twice a day he will take 2 potassiums. I'll see him back for close followup in about 3 weeks. Orthostatic precautions given.    Chronic systolic congestive heart failure   Last Assessment & Plan   05/30/2013 Office Visit Written 05/30/2013  4:29 PM by Lendon Colonel, NP     No evidence of fluid overload is noted today. I have advised him to take the potassium twice a day as directed. If he also has to take an additional metolazone due to fluid  retention, he is to take an additional potassium as well. He will followup with the BMET in a week for reevaluation of his potassium status.  Of note, he is to be on metoprolol tartrate 12.5 mg twice a day. He is only taking one tablet a day. I have changed him to metoprolol XL 25 mg daily. His palpitations and rapid heart rate may be a combination of hypo-kalemia and only using 1 short acting beta blocker dose a day.  He will see [place] again in 6 months unless he becomes symptomatic, or if the lab work reveals significant abnormalities.    Moderate mitral regurgitation     Respiratory   Sinusitis   Last Assessment & Plan   05/12/2011 Office Visit Written 05/15/2011  9:26 AM by Fayrene Helper, MD     Sinus congestion with elevated WBC  And a shift, will treat with antibiotic, pt aware    COPD (chronic obstructive pulmonary disease)   Last Assessment & Plan   07/18/2011 Office Visit Edited 07/20/2011 12:42 PM by Fayrene Helper, MD      Acute exaccerbation of symptoms aggressive anti inflammatory        Digestive   Cholelithiasis   Last Assessment & Plan   07/12/2011 Office Visit Written 08/10/2011  8:38 AM by Fayrene Helper, MD     Pt symptomatic with weight loss  Endocrine   DIABETES MELLITUS, TYPE II, WITH RETINOPATHY   Last Assessment & Plan   07/11/2012 Office Visit Written 07/11/2012 10:41 AM by Lendon Colonel, NP     Noted foot ulcers on the tops of both feet at the 3rd metatarsal, with skin color changes on the right middle toe, with erythema. Will start Keflex 500mg  BID for 10 days. He is referred to podiatrist for ongoing evaluation. Good arterial pulses.      Genitourinary   Chronic kidney disease, stage 4, severely decreased GFR   Last Assessment & Plan   02/01/2012 Office Visit Edited 02/01/2012  2:47 PM by Imogene Burn, PA     His creatinine was 4.55 in July 2013. We will repeat this today. He has an appointment with Dr. Mercy Moore, [place] [facility] on  February 14 2012    ED (erectile dysfunction)   Last Assessment & Plan   02/03/2011 Office Visit Written 02/06/2011  2:53 PM by Fayrene Helper, MD     Persistent and a source of some depression, at one time expressed interest in testosterone supplement if indicated, however , when I explained that this would be lifelong , he declined testing    CKD (chronic kidney disease) stage 4, GFR 15-29 ml/min   Last Assessment & Plan   02/11/2013 Office Visit Written 02/11/2013  1:39 PM by Lendon Colonel, NP     Defer to Dr. Hinda Lenis.       Other   TOBACCO ABUSE   Last Assessment & Plan   07/18/2011 Office Visit Written 07/20/2011 12:15 PM by Fayrene Helper, MD     States quit x 5 days, has copd and is at high risk for lung Ca based on smoking history, chest scan ordered, cesation counselling done    DEPRESSION   DEPENDENT EDEMA, LEGS   Last Assessment & Plan   09/06/2011 Office Visit Written 09/06/2011  3:09 PM by Renella Cunas, MD     Much improved.    BENIGN PROSTATIC HYPERTROPHY, HX OF   SLEEP APNEA   Last Assessment & Plan   08/14/2012 Office Visit Written 08/14/2012  2:06 PM by Lendon Colonel, NP     Encouraged to use CPAP as directed.    Ankle pain, left   Last Assessment & Plan   02/03/2011 Office Visit Written 02/06/2011  2:52 PM by Fayrene Helper, MD     Acute flair, short indocid taper    Poor appetite   Last Assessment & Plan   07/12/2011 Office Visit Written 08/10/2011  8:29 AM by Fayrene Helper, MD     Symptomatic gallstones, will refer to surgery for further eval. May need cholecystectomy     Change in bowel movement   Pulmonary nodule   Last Assessment & Plan   07/18/2011 Office Visit Written 07/20/2011 12:15 PM by Fayrene Helper, MD     Nicotine x 40 plus years, needs chest Ct    Dyspnea   Moderate dehydration   Last Assessment & Plan   07/25/2011 Office Visit Written 07/25/2011  4:33 PM by Renella Cunas, MD     He is clearly prerenal. I've advised him to  stop his metolazone. His weight range to be  237-43. He will weigh himself daily. I've asked him to drink more water the next few days until weight up  4 pounds. We talked about how to avoid syncope. I discontinued his Hytrin since he started on an alpha-blocker and he  has systolic heart failure now. In addition he does not have a prostate issue.  His weight is 02/18/1943 he will take metolazone on a when necessary basis. Otherwise he will stay on Demadex 100 twice a day.  I have him followup with me on July 22 were close followup. We'll check electrolytes then. I am sure his BUN and creatinine are extremely high again.        Imaging: No results found.

## 2013-07-15 NOTE — Assessment & Plan Note (Signed)
Continued management by Dr. Lowanda Foster. We will defer any further medication changes to him

## 2013-07-15 NOTE — Patient Instructions (Addendum)
Your physician recommends that you schedule a follow-up appointment in: 1 month  Your physician has recommended you make the following change in your medication:   [place] METOPROLOL   Please have lab work done in 1 week. (BMET)  Thank you for choosing [facility]!!

## 2013-07-15 NOTE — Progress Notes (Signed)
HPI: [name] is a 68 year old patient of Dr. Marcello Moores former phone for ongoing assessment and management of chronic diastolic and systolic heart failure, or systemic hypertension, with known history of chronic kidney disease, followed by Dr. Lowanda Foster, he is also followed by Dr. Dorris Fetch endocrinologist for acute gout flar-ups.   The patient comes today it is requested due to recurrent spasms in his left chest beginning in the left epigastric area radiating up into the left neck. The patient states this happens once or twice a day. He was worried that it was related to his heart. He denies palpitations, but does continue to have overall muscle aches and pains and weakness. He is now is been compliant with his medications. He continues to drink alcohol.  Allergies  Allergen Reactions  . Ace Inhibitors     REACTION: cough    Current Outpatient Prescriptions  Medication Sig Dispense Refill  . aspirin (ASPIRIN LOW DOSE) 81 MG EC tablet Take 81 mg by mouth daily.        . Colchicine 0.6 MG CAPS Take 0.6 mg by mouth daily.      . febuxostat (ULORIC) 40 MG tablet Take 1 tablet (40 mg total) by mouth daily.  30 tablet  2  . hydrALAZINE (APRESOLINE) 50 MG tablet Take 50 mg by mouth 2 (two) times daily.       . Insulin Glargine (LANTUS SOLOSTAR) 100 UNIT/ML Solostar Pen Inject 10 Units into the skin daily at 10 pm.  5 pen  PRN  . Insulin Pen Needle 31G X 5 MM MISC Use as directed.  50 each  0  . metolazone (ZAROXOLYN) 2.5 MG tablet TAKE 1 TABLET DAILY AS NEEDED FOR SWELLING OR SHORTNESS OF BREATH  30 tablet  3  . metoprolol succinate (TOPROL XL) 25 MG 24 hr tablet Take 1 tablet (25 mg total) by mouth daily.  30 tablet  6  . potassium chloride SA (K-DUR,KLOR-CON) 20 MEQ tablet Take 1 tablet (20 mEq total) by mouth 2 (two) times daily. And as directed  75 tablet  6  . torsemide (DEMADEX) 100 MG tablet Take 100 mg by mouth 2 (two) times daily.       . cloNIDine (CATAPRES) 0.3 MG tablet Take 0.3 mg by mouth 2  (two) times daily.       No current facility-administered medications for this visit.    Past Medical History  Diagnosis Date  . Congestive heart failure 10/2008    preserved LV systolic function   . Aortic insufficiency 09/2008    Normal left ventricular size and function : graded as mild by Doppler , but pulse pressure is widened  . 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 Hypertension   . 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 Erectile dysfunction   . Colonic polyp   . Low serum testosterone level   . Tobacco abuse   . Sleep apnea 2010    Initially unable to afford CPAP  . Benign prostatic hypertrophy   . Obesity, Class I, BMI 30-34.9   . Hyperlipidemia   . Congestive heart failure (CHF)     EF 25-30% 07/2011  . Adenomatous colon polyp 2006  . H. pylori infection 2013    treated with prevpac    Past Surgical History  Procedure Laterality Date  . Retinal laser procedure  diabetic retinopathy  . Lymph node biopsy      surgical exploration of neck-not entirely clear that this represented a lymph node biopsy  . Colonoscopy  08/23/2004    Dr. Vivi Ferns rectum, diminutive polyp of the rectosigmoid removed, inflamed focally adenomatous polyp    YTK:PTWSFK of systems complete and found to be negative unless listed above  PHYSICAL EXAM BP 132/50  Pulse 55  Ht 5\' 9"  (1.753 m)  Wt 195 lb (88.451 kg)  BMI 28.78 kg/m2  SpO2 97% General: Well developed, well nourished, in no acute distress Head: Eyes PERRLA, No xanthomas.   Normal cephalic and atramatic  Lungs: Clear bilaterally to auscultation and percussion. Heart: HRRR S1 S2, without MRG.  Pulses are 2+ & equal.            No carotid bruit. No JVD.  No abdominal bruits. No femoral bruits. Abdomen: Bowel sounds are positive, abdomen soft and non-tender without masses or                  Hernia's noted. Msk:  Back normal,  normal gait. Normal strength and tone for age. Extremities: No clubbing, cyanosis or edema.  DP +1 Neuro: Alert and oriented X 3. Psych:  Good affect, responds appropriately   EKG: Sinus bradycardia rate of 59 beats per minute, LVH,  ASSESSMENT AND PLAN

## 2013-07-15 NOTE — Assessment & Plan Note (Signed)
Currently well-controlled. Would not make any adjustments in his medication regimen at this time. He is medically compliant currently. But has had issues remembering to take potassium. He occasionally takes extra doses of metolazone for fluid retention but does not take it she doses of potassium is resolved. He is basically only taking one potassium a day. I am concerned that he may be having some muscle spasms related to this. His most recent lab work completed by his primary care physician on 07/09/2013 demonstrated potassium of 3.2. He has not changed his dose is on medication replacement of potassium.  I have advised him to take potassium 20 mEq twice a day. If he takes a dose of metolazone for fluid retention he is to take potassium at that time. I have asked him not to take the metolazone often. On occasion only maybe once a month should he gain 3-4 pounds which is not he diuresis use of Lasix. The patient continues to drink alcohol, and I have advised him that taking medications and alcohol are not advisable. He verbalizes understanding. I will repeat his BMET one week after he begins to take potassium as directed. See him again in one month. he has an appointment in August.

## 2013-08-02 LAB — BASIC METABOLIC PANEL
BUN: 83 mg/dL — ABNORMAL HIGH (ref 6–23)
CHLORIDE: 99 meq/L (ref 96–112)
CO2: 29 mEq/L (ref 19–32)
Calcium: 8.4 mg/dL (ref 8.4–10.5)
Creat: 4.61 mg/dL — ABNORMAL HIGH (ref 0.50–1.35)
Glucose, Bld: 155 mg/dL — ABNORMAL HIGH (ref 70–99)
POTASSIUM: 3.6 meq/L (ref 3.5–5.3)
SODIUM: 139 meq/L (ref 135–145)

## 2013-08-08 ENCOUNTER — Telehealth: Payer: Self-pay | Admitting: *Deleted

## 2013-08-08 NOTE — Telephone Encounter (Signed)
Forwarded lab results to pcp

## 2013-08-08 NOTE — Telephone Encounter (Signed)
Message copied by Desma Mcgregor on Thu Aug 08, 2013 11:23 AM ------      Message from: Carlyle Dolly F      Created: Thu Aug 08, 2013 11:15 AM       Labs are stable, no med changes. Renal function slightly better from a month ago. Potassium is normal            Zandra Abts MD ------

## 2013-08-08 NOTE — Telephone Encounter (Signed)
Notified pt of lab results. Pt understood

## 2013-08-15 ENCOUNTER — Ambulatory Visit (INDEPENDENT_AMBULATORY_CARE_PROVIDER_SITE_OTHER): Payer: Medicare Other | Admitting: Adult Health

## 2013-08-15 ENCOUNTER — Encounter: Payer: Self-pay | Admitting: Adult Health

## 2013-08-15 VITALS — BP 170/62 | HR 72 | Ht 69.0 in | Wt 201.0 lb

## 2013-08-15 DIAGNOSIS — I509 Heart failure, unspecified: Secondary | ICD-10-CM

## 2013-08-15 DIAGNOSIS — I10 Essential (primary) hypertension: Secondary | ICD-10-CM

## 2013-08-15 DIAGNOSIS — I5022 Chronic systolic (congestive) heart failure: Secondary | ICD-10-CM

## 2013-08-15 NOTE — Patient Instructions (Signed)
Your physician recommends that you schedule a follow-up appointment in: 6 months You will receive a reminder letter two months in advance reminding you to call and schedule your appointment. If you don't receive this letter, please contact our office.  Your physician recommends that you continue on your current medications as directed. Please refer to the Current Medication list given to you today.     

## 2013-08-15 NOTE — Assessment & Plan Note (Signed)
No evidence of fluid overload on this office visit. I have asked him not to overdo the metolazone. He states when he takes it he does diuresis a bit. I have asked him to only use it maximally once a month, or even less. He has a history of dehydration, I do not wish him to overuse this medication. He verbalizes understanding.

## 2013-08-15 NOTE — Assessment & Plan Note (Signed)
Pressure is elevated on this visit, he has forgotten to take his clonidine, Demadex and metoprolol today. He apparently worked last night, and had not taken his "morning meds". I have advised him to be more compliant with his medications no matter which he works. I talked with him about rebound hypertension on clonidine. He verbalizes understanding. See him again in 6 months unless symptomatic.

## 2013-08-15 NOTE — Progress Notes (Deleted)
Name: [name]    DOB: Jun 06, 1945  Age: 68 y.o.  MR#: [id number]       PCP:  Odette Fraction, MD      Insurance: Payor: [employer] MEDICARE / Plan: AARP MEDICARE COMPLETE / Product Type: *No Product type* /   CC:    Chief Complaint  Patient presents with  . Congestive Heart Failure  . Hypertension    VS Filed Vitals:   08/15/13 1418  BP: 170/62  Pulse: 72  Height: 5\' 9"  (1.753 m)  Weight: 201 lb (91.173 kg)  SpO2: 95%    Weights Current Weight  08/15/13 201 lb (91.173 kg)  07/15/13 195 lb (88.451 kg)  07/09/13 194 lb (87.998 kg)    Blood Pressure  BP Readings from Last 3 Encounters:  08/15/13 170/62  07/15/13 132/50  07/09/13 130/40     Admit date:  (Not on file) Last encounter with RMR:  07/15/2013   Allergy Ace inhibitors  Current Outpatient Prescriptions  Medication Sig Dispense Refill  . aspirin (ASPIRIN LOW DOSE) 81 MG EC tablet Take 81 mg by mouth daily.        . Colchicine 0.6 MG CAPS Take 0.6 mg by mouth daily.      . febuxostat (ULORIC) 40 MG tablet Take 1 tablet (40 mg total) by mouth daily.  30 tablet  2  . hydrALAZINE (APRESOLINE) 50 MG tablet Take 50 mg by mouth 2 (two) times daily.       . Insulin Glargine (LANTUS SOLOSTAR) 100 UNIT/ML Solostar Pen Inject 10 Units into the skin daily at 10 pm.  5 pen  PRN  . Insulin Pen Needle 31G X 5 MM MISC Use as directed.  50 each  0  . metolazone (ZAROXOLYN) 2.5 MG tablet TAKE 1 TABLET DAILY AS NEEDED FOR SWELLING OR SHORTNESS OF BREATH  30 tablet  3  . metoprolol succinate (TOPROL XL) 25 MG 24 hr tablet Take 1 tablet (25 mg total) by mouth daily.  30 tablet  6  . potassium chloride SA (K-DUR,KLOR-CON) 20 MEQ tablet Take 1 tablet (20 mEq total) by mouth 2 (two) times daily. And as directed  75 tablet  6  . torsemide (DEMADEX) 100 MG tablet Take 100 mg by mouth 2 (two) times daily.       . cloNIDine (CATAPRES) 0.3 MG tablet Take 0.3 mg by mouth 2 (two) times daily.       No current  facility-administered medications for this visit.    Discontinued Meds:   There are no discontinued medications.  Patient Active Problem List   Diagnosis Date Noted  . CKD (chronic kidney disease) stage 4, GFR 15-29 ml/min 08/14/2011  . Moderate mitral regurgitation 08/12/2011  . Cholelithiasis 08/10/2011  . Moderate dehydration 07/25/2011  . Chronic systolic congestive heart failure 07/21/2011  . Dyspnea 07/19/2011  . COPD (chronic obstructive pulmonary disease) 07/18/2011  . Pulmonary nodule 07/14/2011  . Poor appetite 07/12/2011  . Change in bowel movement 07/12/2011  . Syncope 05/12/2011  . Sinusitis 05/12/2011  . Ankle pain, left 02/03/2011  . ED (erectile dysfunction) 11/02/2010  . Aortic valve disease 04/16/2010  . Chronic kidney disease, stage 4, severely decreased GFR 04/16/2010  . SLEEP APNEA 01/14/2010  . DEPRESSION 06/29/2009  . BENIGN PROSTATIC HYPERTROPHY, HX OF 03/02/2009  . DEPENDENT EDEMA, LEGS 02/18/2008  . DIABETES MELLITUS, TYPE II, WITH RETINOPATHY 02/23/2007  . TOBACCO ABUSE 02/23/2007  . HYPERTENSION 02/23/2007    LABS    Component Value  Date/Time   NA 139 08/02/2013 0853   NA 137 07/09/2013 0902   NA 134* 04/16/2013 0809   K 3.6 08/02/2013 0853   K 3.2* 07/09/2013 0902   K 2.8* 04/16/2013 0809   CL 99 08/02/2013 0853   CL 96 07/09/2013 0902   CL 93* 04/16/2013 0809   CO2 29 08/02/2013 0853   CO2 25 07/09/2013 0902   CO2 28 04/16/2013 0809   GLUCOSE 155* 08/02/2013 0853   GLUCOSE 102* 07/09/2013 0902   GLUCOSE 138* 04/16/2013 0809   BUN 83* 08/02/2013 0853   BUN 108* 07/09/2013 0902   BUN 104* 04/16/2013 0809   CREATININE 4.61* 08/02/2013 0853   CREATININE 5.09* 07/09/2013 0902   CREATININE 4.65* 04/16/2013 0809   CREATININE 3.58* 07/21/2011 0629   CREATININE 3.28* 07/20/2011 0316   CREATININE 3.25* 07/19/2011 0601   CALCIUM 8.4 08/02/2013 0853   CALCIUM 9.4 07/09/2013 0902   CALCIUM 9.5 04/16/2013 0809   GFRNONAA 11* 07/09/2013 0902   GFRNONAA 16* 07/21/2011  0629   GFRNONAA 18* 07/20/2011 0316   GFRNONAA 18* 07/19/2011 0601   GFRNONAA 10* 05/12/2011 0934   GFRAA 12* 07/09/2013 0902   GFRAA 19* 07/21/2011 0629   GFRAA 21* 07/20/2011 0316   GFRAA 21* 07/19/2011 0601   GFRAA 12* 05/12/2011 0934   CMP     Component Value Date/Time   NA 139 08/02/2013 0853   K 3.6 08/02/2013 0853   CL 99 08/02/2013 0853   CO2 29 08/02/2013 0853   GLUCOSE 155* 08/02/2013 0853   BUN 83* 08/02/2013 0853   CREATININE 4.61* 08/02/2013 0853   CREATININE 3.58* 07/21/2011 0629   CALCIUM 8.4 08/02/2013 0853   PROT 6.6 07/09/2013 0902   ALBUMIN 4.2 07/09/2013 0902   AST 10 07/09/2013 0902   ALT <8 07/09/2013 0902   ALKPHOS 53 07/09/2013 0902   BILITOT 0.4 07/09/2013 0902   GFRNONAA 11* 07/09/2013 0902   GFRNONAA 16* 07/21/2011 0629   GFRAA 12* 07/09/2013 0902   GFRAA 19* 07/21/2011 0629       Component Value Date/Time   WBC 7.7 07/19/2011 0601   WBC 6.4 07/15/2011 1050   WBC 14.3* 05/12/2011 0934   HGB 11.1* 07/19/2011 0601   HGB 10.3* 07/15/2011 1050   HGB 8.1* 05/12/2011 0934   HCT 32.8* 07/19/2011 0601   HCT 30.5* 07/15/2011 1050   HCT 26.1* 05/12/2011 0934   MCV 92.1 07/19/2011 0601   MCV 91.9 07/15/2011 1050   MCV 87.9 05/12/2011 0934    Lipid Panel     Component Value Date/Time   CHOL 140 07/09/2013 0902   TRIG 58 07/09/2013 0902   HDL 63 07/09/2013 0902   CHOLHDL 2.2 07/09/2013 0902   VLDL 12 07/09/2013 0902   LDLCALC 65 07/09/2013 0902    ABG    Component Value Date/Time   PHART 7.414 11/17/2008 2330   PCO2ART 39.9 11/17/2008 2330   PO2ART 79.5* 11/17/2008 2330   HCO3 25.1* 11/17/2008 2330   TCO2 22.6 11/17/2008 2330   ACIDBASEDEF 1.8 11/11/2008 0215   O2SAT 95.7 11/17/2008 2330     Lab Results  Component Value Date   TSH 1.180 07/19/2011   BNP (last 3 results) No results found for this basename: PROBNP,  in the last 8760 hours Cardiac Panel (last 3 results) No results found for this basename: CKTOTAL, CKMB, TROPONINI, RELINDX,  in the last 72 hours  Iron/TIBC/Ferritin/ %Sat No  results found for this basename: iron, tibc, ferritin, ironpctsat  EKG Orders placed in visit on 07/15/13  . EKG 12-LEAD     Prior Assessment and Plan Problem List as of 08/15/2013     Cardiovascular and Mediastinum   HYPERTENSION   Last Assessment & Plan   07/15/2013 Office Visit Written 07/15/2013  4:49 PM by Lendon Colonel, NP     Currently well-controlled. Would not make any adjustments in his medication regimen at this time. He is medically compliant currently. But has had issues remembering to take potassium. He occasionally takes extra doses of metolazone for fluid retention but does not take it she doses of potassium is resolved. He is basically only taking one potassium a day. I am concerned that he may be having some muscle spasms related to this. His most recent lab work completed by his primary care physician on 07/09/2013 demonstrated potassium of 3.2. He has not changed his dose is on medication replacement of potassium.  I have advised him to take potassium 20 mEq twice a day. If he takes a dose of metolazone for fluid retention he is to take potassium at that time. I have asked him not to take the metolazone often. On occasion only maybe once a month should he gain 3-4 pounds which is not he diuresis use of Lasix. The patient continues to drink alcohol, and I have advised him that taking medications and alcohol are not advisable. He verbalizes understanding. I will repeat his BMET one week after he begins to take potassium as directed. See him again in one month. he has an appointment in August.    Aortic valve disease   Last Assessment & Plan   07/15/2013 Office Visit Written 07/15/2013  4:51 PM by Lendon Colonel, NP     He is asymptomatic currently for shortness of breath. I think some of the discomfort is more muscle spasm vs. cardiac pain.    Syncope   Last Assessment & Plan   08/12/2011 Office Visit Written 08/12/2011 10:21 AM by Renella Cunas, MD     No  recurrence. He is still mildly orthostatic but his heart rate increases with the lower dose of labetalol. I still think he severely volume depleted. I decreased his torsemide to once a day with careful watching daily weights. If he becomes short of breath, developed swelling, or his weight exceeds greater than 3 pounds, he will go back to twice a day torsemide at that time. While he is taking it once a day we have cut his potassium to once a day. If he takes it twice a day he will take 2 potassiums. I'll see him back for close followup in about 3 weeks. Orthostatic precautions given.    Chronic systolic congestive heart failure   Last Assessment & Plan   05/30/2013 Office Visit Written 05/30/2013  4:29 PM by Lendon Colonel, NP     No evidence of fluid overload is noted today. I have advised him to take the potassium twice a day as directed. If he also has to take an additional metolazone due to fluid retention, he is to take an additional potassium as well. He will followup with the BMET in a week for reevaluation of his potassium status.  Of note, he is to be on metoprolol tartrate 12.5 mg twice a day. He is only taking one tablet a day. I have changed him to metoprolol XL 25 mg daily. His palpitations and rapid heart rate may be a combination of hypo-kalemia and only using 1 short  acting beta blocker dose a day.  He will see [place] again in 6 months unless he becomes symptomatic, or if the lab work reveals significant abnormalities.    Moderate mitral regurgitation     Respiratory   Sinusitis   Last Assessment & Plan   05/12/2011 Office Visit Written 05/15/2011  9:26 AM by Fayrene Helper, MD     Sinus congestion with elevated WBC  And a shift, will treat with antibiotic, pt aware    COPD (chronic obstructive pulmonary disease)   Last Assessment & Plan   07/18/2011 Office Visit Edited 07/20/2011 12:42 PM by Fayrene Helper, MD      Acute exaccerbation of symptoms aggressive anti  inflammatory        Digestive   Cholelithiasis   Last Assessment & Plan   07/12/2011 Office Visit Written 08/10/2011  8:38 AM by Fayrene Helper, MD     Pt symptomatic with weight loss      Endocrine   DIABETES MELLITUS, TYPE II, WITH RETINOPATHY   Last Assessment & Plan   07/11/2012 Office Visit Written 07/11/2012 10:41 AM by Lendon Colonel, NP     Noted foot ulcers on the tops of both feet at the 3rd metatarsal, with skin color changes on the right middle toe, with erythema. Will start Keflex 500mg  BID for 10 days. He is referred to podiatrist for ongoing evaluation. Good arterial pulses.      Genitourinary   Chronic kidney disease, stage 4, severely decreased GFR   Last Assessment & Plan   07/15/2013 Office Visit Written 07/15/2013  4:51 PM by Lendon Colonel, NP     Continued management by Dr. Lowanda Foster. We will defer any further medication changes to him    ED (erectile dysfunction)   Last Assessment & Plan   02/03/2011 Office Visit Written 02/06/2011  2:53 PM by Fayrene Helper, MD     Persistent and a source of some depression, at one time expressed interest in testosterone supplement if indicated, however , when I explained that this would be lifelong , he declined testing    CKD (chronic kidney disease) stage 4, GFR 15-29 ml/min   Last Assessment & Plan   02/11/2013 Office Visit Written 02/11/2013  1:39 PM by Lendon Colonel, NP     Defer to Dr. Hinda Lenis.       Other   TOBACCO ABUSE   Last Assessment & Plan   07/18/2011 Office Visit Written 07/20/2011 12:15 PM by Fayrene Helper, MD     States quit x 5 days, has copd and is at high risk for lung Ca based on smoking history, chest scan ordered, cesation counselling done    DEPRESSION   DEPENDENT EDEMA, LEGS   Last Assessment & Plan   09/06/2011 Office Visit Written 09/06/2011  3:09 PM by Renella Cunas, MD     Much improved.    BENIGN PROSTATIC HYPERTROPHY, HX OF   SLEEP APNEA   Last Assessment & Plan    08/14/2012 Office Visit Written 08/14/2012  2:06 PM by Lendon Colonel, NP     Encouraged to use CPAP as directed.    Ankle pain, left   Last Assessment & Plan   02/03/2011 Office Visit Written 02/06/2011  2:52 PM by Fayrene Helper, MD     Acute flair, short indocid taper    Poor appetite   Last Assessment & Plan   07/12/2011 Office Visit Written 08/10/2011  8:29 AM by  Fayrene Helper, MD     Symptomatic gallstones, will refer to surgery for further eval. May need cholecystectomy     Change in bowel movement   Pulmonary nodule   Last Assessment & Plan   07/18/2011 Office Visit Written 07/20/2011 12:15 PM by Fayrene Helper, MD     Nicotine x 40 plus years, needs chest Ct    Dyspnea   Moderate dehydration   Last Assessment & Plan   07/25/2011 Office Visit Written 07/25/2011  4:33 PM by Renella Cunas, MD     He is clearly prerenal. I've advised him to stop his metolazone. His weight range to be  237-43. He will weigh himself daily. I've asked him to drink more water the next few days until weight up  4 pounds. We talked about how to avoid syncope. I discontinued his Hytrin since he started on an alpha-blocker and he has systolic heart failure now. In addition he does not have a prostate issue.  His weight is 02/18/1943 he will take metolazone on a when necessary basis. Otherwise he will stay on Demadex 100 twice a day.  I have him followup with me on July 22 were close followup. We'll check electrolytes then. I am sure his BUN and creatinine are extremely high again.        Imaging: No results found.

## 2013-08-15 NOTE — Progress Notes (Signed)
HPI: [name] is a 68 year old patient to be est. with Dr. Harl [name] or Dr. Pennelope [name], normally followed by Dr. Mar [name]. We managed for chronic diastolic and systolic heart failure, hypertension, with known history of chronic kidney disease followed by Dr. Lowanda [name]. The patient was last seen in the office in June of 2050 with complaints of recurrent spasms in his left chest beginning in the left epigastric area radiating to the left neck. He states it occurred twice a day and he felt it was related to his heart. He is medically compliant but continues to drink a good bit of alcohol.  The patient was found to be hypokalemic on recent labs with a potassium of 3.2. He was advised to take his potassium as directed 20 mEq twice a day. If he were to take an extra dose of metolazone for X. or fluid retention he was to take extra potassium at that time. I have advised him not to take metolazone regularly however. Maybe once a month. He was advised on alcohol cessation.  He comes today hypertensive but has not been taking his blood pressure medicines as directed at least today. He states he sometimes forgets. He denies alcoholism, stating he may drink once a month. He has occasional palpitations although this is not regular for him.    Allergies  Allergen Reactions  . Ace Inhibitors     REACTION: cough    Current Outpatient Prescriptions  Medication Sig Dispense Refill  . aspirin (ASPIRIN LOW DOSE) 81 MG EC tablet Take 81 mg by mouth daily.        . Colchicine 0.6 MG CAPS Take 0.6 mg by mouth daily.      . febuxostat (ULORIC) 40 MG tablet Take 1 tablet (40 mg total) by mouth daily.  30 tablet  2  . hydrALAZINE (APRESOLINE) 50 MG tablet Take 50 mg by mouth 2 (two) times daily.       . Insulin Glargine (LANTUS SOLOSTAR) 100 UNIT/ML Solostar Pen Inject 10 Units into the skin daily at 10 pm.  5 pen  PRN  . Insulin Pen Needle 31G X 5 MM MISC Use as directed.  50 each  0  . metolazone (ZAROXOLYN) 2.5 MG  tablet TAKE 1 TABLET DAILY AS NEEDED FOR SWELLING OR SHORTNESS OF BREATH  30 tablet  3  . metoprolol succinate (TOPROL XL) 25 MG 24 hr tablet Take 1 tablet (25 mg total) by mouth daily.  30 tablet  6  . potassium chloride SA (K-DUR,KLOR-CON) 20 MEQ tablet Take 1 tablet (20 mEq total) by mouth 2 (two) times daily. And as directed  75 tablet  6  . torsemide (DEMADEX) 100 MG tablet Take 100 mg by mouth 2 (two) times daily.       . cloNIDine (CATAPRES) 0.3 MG tablet Take 0.3 mg by mouth 2 (two) times daily.       No current facility-administered medications for this visit.    Past Medical History  Diagnosis Date  . Congestive heart failure 10/2008    preserved LV systolic function   . Aortic insufficiency 09/2008    Normal left ventricular size and function : graded as mild by Doppler , but pulse pressure is widened  . 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 Hypertension   . 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 Erectile dysfunction   .  Colonic polyp   . Low serum testosterone level   . Tobacco abuse   . Sleep apnea 2010    Initially unable to afford CPAP  . Benign prostatic hypertrophy   . Obesity, Class I, BMI 30-34.9   . Hyperlipidemia   . Congestive heart failure (CHF)     EF 25-30% 07/2011  . Adenomatous colon polyp 2006  . H. pylori infection 2013    treated with prevpac    Past Surgical History  Procedure Laterality Date  . Retinal laser procedure      diabetic retinopathy  . Lymph node biopsy      surgical exploration of neck-not entirely clear that this represented a lymph node biopsy  . Colonoscopy  08/23/2004    Dr. Vivi [name] rectum, diminutive polyp of the rectosigmoid removed, inflamed focally adenomatous polyp    ROS:  Review of systems complete and found to be negative unless listed above  PHYSICAL EXAM BP 170/62  Pulse 72  Ht 5\' 9"  (1.753 m)  Wt 201 lb (91.173 kg)   BMI 29.67 kg/m2  SpO2 95% General: Well developed, well nourished, in no acute distress Head: Eyes PERRLA, No xanthomas.   Normal cephalic and atramatic  Lungs: Clear bilaterally to auscultation and percussion. Heart: HRRR S1 S2, without MRG.  Pulses are 2+ & equal.            No carotid bruit. No JVD.  No abdominal bruits. No femoral bruits. Abdomen: Bowel sounds are positive, abdomen soft and non-tender without masses or                  Hernia's noted. Msk:  Back normal, normal gait. Normal strength and tone for age. Extremities: No clubbing, cyanosis or edema.  DP +1 Neuro: Alert and oriented X 3. Psych:  Good affect, responds appropriately   ASSESSMENT AND PLAN

## 2013-09-12 ENCOUNTER — Encounter: Payer: Self-pay | Admitting: Family Medicine

## 2013-09-12 ENCOUNTER — Ambulatory Visit (INDEPENDENT_AMBULATORY_CARE_PROVIDER_SITE_OTHER): Payer: Medicare Other | Admitting: Family Medicine

## 2013-09-12 VITALS — BP 130/48 | HR 60 | Temp 98.0°F | Resp 18 | Ht 71.0 in | Wt 199.0 lb

## 2013-09-12 DIAGNOSIS — G4700 Insomnia, unspecified: Secondary | ICD-10-CM

## 2013-09-12 MED ORDER — ALPRAZOLAM 0.5 MG PO TABS: 0.5000 mg | ORAL_TABLET | Freq: Every evening | ORAL | Status: DC | PRN

## 2013-09-12 NOTE — Progress Notes (Signed)
Subjective:    Patient ID: [name], male    DOB: [birth year], 68 y.o.   MRN: [id number]  HPI Patient reports difficulty sleeping over the last 5 months. Patient states that he goes to bed typically around 10:00 at night. He is able to sleep approximately 2 hours. However after that, he must go to the bathroom to urinate. Afterwards he is unable to return back to sleep. He is only getting 2-3 hours of sleep total each night. This is him extremely fatigued and tired throughout the day. He has very little energy. He is concerned some type of medication reaction. Currently his blood pressure is well controlled at 130/48. He denies any chest pain shortness of breath or dyspnea on exertion. He is currently taking 15 units of Lantus daily. His fasting blood sugars typically range 80-110. He denies any hypoglycemia. Past Medical History  Diagnosis Date  . Congestive heart failure 10/2008    preserved LV systolic function   . Aortic insufficiency 09/2008    Normal left ventricular size and function : graded as mild by Doppler , but pulse pressure is widened  . 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 Hypertension   . 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 Erectile dysfunction   . Colonic polyp   . Low serum testosterone level   . Tobacco abuse   . Sleep apnea 2010    Initially unable to afford CPAP  . Benign prostatic hypertrophy   . Obesity, Class I, BMI 30-34.9   . Hyperlipidemia   . Congestive heart failure (CHF)     EF 25-30% 07/2011  . Adenomatous colon polyp 2006  . H. pylori infection 2013    treated with prevpac   Current Outpatient Prescriptions on File Prior to Visit  Medication Sig Dispense Refill  . aspirin (ASPIRIN LOW DOSE) 81 MG EC tablet Take 81 mg by mouth daily.        . cloNIDine (CATAPRES) 0.3 MG tablet Take 0.3 mg by mouth 2 (two) times daily.      . Colchicine 0.6 MG CAPS  Take 0.6 mg by mouth daily.      . febuxostat (ULORIC) 40 MG tablet Take 1 tablet (40 mg total) by mouth daily.  30 tablet  2  . hydrALAZINE (APRESOLINE) 50 MG tablet Take 50 mg by mouth 2 (two) times daily.       . Insulin Glargine (LANTUS SOLOSTAR) 100 UNIT/ML Solostar Pen Inject 10 Units into the skin daily at 10 pm.  5 pen  PRN  . Insulin Pen Needle 31G X 5 MM MISC Use as directed.  50 each  0  . metolazone (ZAROXOLYN) 2.5 MG tablet TAKE 1 TABLET DAILY AS NEEDED FOR SWELLING OR SHORTNESS OF BREATH  30 tablet  3  . metoprolol succinate (TOPROL XL) 25 MG 24 hr tablet Take 1 tablet (25 mg total) by mouth daily.  30 tablet  6  . potassium chloride SA (K-DUR,KLOR-CON) 20 MEQ tablet Take 1 tablet (20 mEq total) by mouth 2 (two) times daily. And as directed  75 tablet  6  . torsemide (DEMADEX) 100 MG tablet Take 100 mg by mouth 2 (two) times daily.        No current facility-administered medications on file prior to visit.   Allergies  Allergen Reactions  . Ace Inhibitors  REACTION: cough   Past Surgical History  Procedure Laterality Date  . Retinal laser procedure      diabetic retinopathy  . Lymph node biopsy      surgical exploration of neck-not entirely clear that this represented a lymph node biopsy  . Colonoscopy  08/23/2004    Dr. Vivi Ferns rectum, diminutive polyp of the rectosigmoid removed, inflamed focally adenomatous polyp   History   Social History  . Marital Status: Married    Spouse Name: N/A    Number of Children: 4  . Years of Education: N/A   Occupational History  . Semi - Retired Administrator    Social History Main Topics  . Smoking status: Current Some Day Smoker -- 45 years    Types: Cigarettes  . Smokeless tobacco: Current User     Comment: 1-2 cigarettes OF THE NEW ELECTRIC CIGARETTES  . Alcohol Use: 2.4 oz/week    2 Glasses of wine, 2 Shots of liquor per week     Comment: occ  . Drug Use: No  . Sexual Activity: Not on file   Other Topics  Concern  . Not on file   Social History Narrative  . No narrative on file       Review of Systems  All other systems reviewed and are negative.      Objective:   Physical Exam  Vitals reviewed. Constitutional: He appears well-developed and well-nourished.  Neck: Neck supple.  Cardiovascular: Normal rate, regular rhythm and normal heart sounds.   No murmur heard. Pulmonary/Chest: Effort normal and breath sounds normal. No respiratory distress. He has no wheezes. He has no rales.  Psychiatric: He has a normal mood and affect. His behavior is normal. Judgment and thought content normal.          Assessment & Plan:  Insomnia  Patient denies any depression or anxiety complicating his sleep. We discussed possible options including habit-forming medication and not habit-forming medication. At the present time the patient would like to try Unisom over-the-counter to help him sleep. I advised him not to take it at 9:30 when he awakens at 11:30 or 12:00 at night, to take one pill at that time to try to help him go back to sleep. He Unisom is ineffective, I recommend trying Xanax in its place. Patient requires constant daily medication, he may be better suited to trazodone or belsomra.

## 2013-09-18 ENCOUNTER — Other Ambulatory Visit: Payer: Self-pay | Admitting: *Deleted

## 2013-09-18 MED ORDER — GLUCOSE BLOOD VI STRP: ORAL_STRIP | Status: DC

## 2013-09-18 NOTE — Telephone Encounter (Signed)
Received fax requesting refill on test strips.   Refill appropriate and filled per protocol.

## 2013-09-19 ENCOUNTER — Other Ambulatory Visit: Payer: Self-pay | Admitting: Family Medicine

## 2013-10-28 ENCOUNTER — Encounter: Payer: Self-pay | Admitting: Family Medicine

## 2013-10-28 ENCOUNTER — Ambulatory Visit (INDEPENDENT_AMBULATORY_CARE_PROVIDER_SITE_OTHER): Payer: Medicare Other | Admitting: Family Medicine

## 2013-10-28 VITALS — BP 110/60 | HR 60 | Temp 97.5°F | Resp 16 | Ht 71.0 in | Wt 202.0 lb

## 2013-10-28 DIAGNOSIS — Z794 Long term (current) use of insulin: Secondary | ICD-10-CM

## 2013-10-28 DIAGNOSIS — E119 Type 2 diabetes mellitus without complications: Secondary | ICD-10-CM

## 2013-10-28 LAB — COMPLETE METABOLIC PANEL WITH GFR
ALBUMIN: 3.7 g/dL (ref 3.5–5.2)
ALT: <8 U/L (ref 0–53)
AST: 11 U/L (ref 0–37)
Alkaline Phosphatase: 55 U/L (ref 39–117)
BUN: 86 mg/dL — AB (ref 6–23)
CALCIUM: 9 mg/dL (ref 8.4–10.5)
CHLORIDE: 101 meq/L (ref 96–112)
CO2: 23 meq/L (ref 19–32)
Creat: 4.59 mg/dL — ABNORMAL HIGH (ref 0.50–1.35)
GFR, EST AFRICAN AMERICAN: 14 mL/min — AB
GFR, EST NON AFRICAN AMERICAN: 12 mL/min — AB
GLUCOSE: 127 mg/dL — AB (ref 70–99)
POTASSIUM: 3.4 meq/L — AB (ref 3.5–5.3)
SODIUM: 137 meq/L (ref 135–145)
TOTAL PROTEIN: 5.7 g/dL — AB (ref 6.0–8.3)
Total Bilirubin: 0.4 mg/dL (ref 0.2–1.2)

## 2013-10-28 LAB — HEMOGLOBIN A1C
HEMOGLOBIN A1C: 6.8 % — AB (ref <5.7)
Mean Plasma Glucose: 148 mg/dL — ABNORMAL HIGH (ref <117)

## 2013-10-28 NOTE — Progress Notes (Signed)
Subjective:    Patient ID: [name], male    DOB: Feb 13, 1945, 68 y.o.   MRN: [id number]  HPI Patient is here today for recheck of his diabetes. He is currently on Lantus 10 units subcutaneous daily. However the patient does not take his insulin regularly. Occasionally he stops the insulin when his blood sugars are low. At other times his postprandial sugars are elevated in the 200-250 range. Some days, he take januvia 100 mg by mouth daily. Patient seems to take whatever he feels like he needs to take based on his blood sugar readings.  Patient is a candidate by his wife. She admits it is not consistent with his diet. His blood pressure is much better today at 110/60. VS severe chronic kidney disease line hemodialysis dependent. Past Medical History  Diagnosis Date  . Congestive heart failure 10/2008    preserved LV systolic function   . Aortic insufficiency 09/2008    Normal left ventricular size and function : graded as mild by Doppler , but pulse pressure is widened  . 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 Hypertension   . 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 Erectile dysfunction   . Colonic polyp   . Low serum testosterone level   . Tobacco abuse   . Sleep apnea 2010    Initially unable to afford CPAP  . Benign prostatic hypertrophy   . Obesity, Class I, BMI 30-34.9   . Hyperlipidemia   . Congestive heart failure (CHF)     EF 25-30% 07/2011  . Adenomatous colon polyp 2006  . H. pylori infection 2013    treated with prevpac   Past Surgical History  Procedure Laterality Date  . Retinal laser procedure      diabetic retinopathy  . Lymph node biopsy      surgical exploration of neck-not entirely clear that this represented a lymph node biopsy  . Colonoscopy  08/23/2004    Dr. Vivi Ferns rectum, diminutive polyp of the rectosigmoid removed, inflamed focally adenomatous polyp    Current Outpatient Prescriptions on File Prior to Visit  Medication Sig Dispense Refill  . ALPRAZolam (XANAX) 0.5 MG tablet Take 1 tablet (0.5 mg total) by mouth at bedtime as needed for anxiety.  30 tablet  0  . aspirin (ASPIRIN LOW DOSE) 81 MG EC tablet Take 81 mg by mouth daily.        . cloNIDine (CATAPRES) 0.3 MG tablet Take 0.3 mg by mouth 2 (two) times daily.      . Colchicine 0.6 MG CAPS Take 0.6 mg by mouth daily.      . febuxostat (ULORIC) 40 MG tablet Take 1 tablet (40 mg total) by mouth daily.  30 tablet  2  . glucose blood test strip Use as instructed to monitor FSBS 4x daily. DX: 250.00.  200 each  12  . hydrALAZINE (APRESOLINE) 50 MG tablet Take 50 mg by mouth 2 (two) times daily.       . Insulin Glargine (LANTUS SOLOSTAR) 100 UNIT/ML Solostar Pen Inject 10 Units into the skin daily at 10 pm.  5 pen  PRN  . Insulin Pen Needle 31G X 5 MM MISC Use as directed.  50 each  0  . metolazone (ZAROXOLYN) 2.5 MG tablet TAKE 1 TABLET DAILY AS NEEDED FOR SWELLING OR SHORTNESS OF BREATH  30 tablet  3  . metoprolol succinate (TOPROL XL) 25 MG 24 hr tablet Take 1 tablet (25 mg total) by mouth daily.  30 tablet  6  . potassium chloride SA (K-DUR,KLOR-CON) 20 MEQ tablet Take 1 tablet (20 mEq total) by mouth 2 (two) times daily. And as directed  75 tablet  6  . torsemide (DEMADEX) 100 MG tablet Take 100 mg by mouth 2 (two) times daily.        No current facility-administered medications on file prior to visit.   Allergies  Allergen Reactions  . Ace Inhibitors     REACTION: cough   History   Social History  . Marital Status: Married    Spouse Name: N/A    Number of Children: 4  . Years of Education: N/A   Occupational History  . Semi - Retired Administrator    Social History Main Topics  . Smoking status: Current Some Day Smoker -- 45 years    Types: Cigarettes  . Smokeless tobacco: Current User     Comment: 1-2 cigarettes OF THE NEW ELECTRIC CIGARETTES  . Alcohol Use: 2.4  oz/week    2 Glasses of wine, 2 Shots of liquor per week     Comment: occ  . Drug Use: No  . Sexual Activity: Not on file   Other Topics Concern  . Not on file   Social History Narrative  . No narrative on file       Review of Systems  All other systems reviewed and are negative.      Objective:   Physical Exam  Vitals reviewed. Constitutional: He appears well-developed and well-nourished.  Cardiovascular: Normal rate and regular rhythm.   Murmur heard. Pulmonary/Chest: Effort normal and breath sounds normal. No respiratory distress. He has no wheezes. He has no rales.  Abdominal: Soft. Bowel sounds are normal. He exhibits no distension. There is no tenderness. There is no rebound and no guarding.  Musculoskeletal: He exhibits no edema.          Assessment & Plan:  Diabetes mellitus, type II, insulin dependent - Plan: COMPLETE METABOLIC PANEL WITH GFR, Hemoglobin A1c  I recommended the patient take 45 g of carbs with every meal. I recommended he consumes 3 meals a day and not skip meals. Also recommended a 15 g carbohydrate snack prior to going to bed at night. I believe the patient needs more consistently, we can then titrate his insulin to achieve more consistent blood sugar control.  Patient is to report his fasting blood sugars and 2 postprandial sugars to me in one week.

## 2013-11-02 ENCOUNTER — Emergency Department (HOSPITAL_COMMUNITY): Payer: Medicare Other

## 2013-11-02 ENCOUNTER — Observation Stay (HOSPITAL_COMMUNITY)
Admission: EM | Admit: 2013-11-02 | Discharge: 2013-11-03 | Disposition: A | Discharge status: Home / Self Care | Payer: Medicare Other | Attending: Internal Medicine | Admitting: Internal Medicine

## 2013-11-02 ENCOUNTER — Encounter (HOSPITAL_COMMUNITY): Payer: Self-pay | Admitting: Emergency Medicine

## 2013-11-02 DIAGNOSIS — E669 Obesity, unspecified: Secondary | ICD-10-CM | POA: Insufficient documentation

## 2013-11-02 DIAGNOSIS — I509 Heart failure, unspecified: Secondary | ICD-10-CM | POA: Diagnosis not present

## 2013-11-02 DIAGNOSIS — N529 Male erectile dysfunction, unspecified: Secondary | ICD-10-CM | POA: Diagnosis not present

## 2013-11-02 DIAGNOSIS — E291 Testicular hypofunction: Secondary | ICD-10-CM | POA: Insufficient documentation

## 2013-11-02 DIAGNOSIS — Z79899 Other long term (current) drug therapy: Secondary | ICD-10-CM | POA: Insufficient documentation

## 2013-11-02 DIAGNOSIS — N400 Enlarged prostate without lower urinary tract symptoms: Secondary | ICD-10-CM | POA: Diagnosis not present

## 2013-11-02 DIAGNOSIS — I359 Nonrheumatic aortic valve disorder, unspecified: Secondary | ICD-10-CM

## 2013-11-02 DIAGNOSIS — Z86010 Personal history of colonic polyps: Secondary | ICD-10-CM | POA: Insufficient documentation

## 2013-11-02 DIAGNOSIS — I351 Nonrheumatic aortic (valve) insufficiency: Secondary | ICD-10-CM | POA: Insufficient documentation

## 2013-11-02 DIAGNOSIS — R079 Chest pain, unspecified: Secondary | ICD-10-CM

## 2013-11-02 DIAGNOSIS — E119 Type 2 diabetes mellitus without complications: Secondary | ICD-10-CM | POA: Diagnosis not present

## 2013-11-02 DIAGNOSIS — I5022 Chronic systolic (congestive) heart failure: Secondary | ICD-10-CM

## 2013-11-02 DIAGNOSIS — N184 Chronic kidney disease, stage 4 (severe): Secondary | ICD-10-CM | POA: Insufficient documentation

## 2013-11-02 DIAGNOSIS — I10 Essential (primary) hypertension: Secondary | ICD-10-CM

## 2013-11-02 DIAGNOSIS — I129 Hypertensive chronic kidney disease with stage 1 through stage 4 chronic kidney disease, or unspecified chronic kidney disease: Secondary | ICD-10-CM | POA: Diagnosis not present

## 2013-11-02 DIAGNOSIS — R0789 Other chest pain: Secondary | ICD-10-CM | POA: Diagnosis not present

## 2013-11-02 DIAGNOSIS — Z7982 Long term (current) use of aspirin: Secondary | ICD-10-CM | POA: Insufficient documentation

## 2013-11-02 DIAGNOSIS — R0602 Shortness of breath: Secondary | ICD-10-CM | POA: Diagnosis not present

## 2013-11-02 DIAGNOSIS — Z720 Tobacco use: Secondary | ICD-10-CM | POA: Insufficient documentation

## 2013-11-02 DIAGNOSIS — R002 Palpitations: Secondary | ICD-10-CM | POA: Insufficient documentation

## 2013-11-02 DIAGNOSIS — G4730 Sleep apnea, unspecified: Secondary | ICD-10-CM | POA: Diagnosis not present

## 2013-11-02 DIAGNOSIS — E11319 Type 2 diabetes mellitus with unspecified diabetic retinopathy without macular edema: Secondary | ICD-10-CM

## 2013-11-02 DIAGNOSIS — E876 Hypokalemia: Secondary | ICD-10-CM | POA: Diagnosis present

## 2013-11-02 LAB — BASIC METABOLIC PANEL
Anion gap: 15 (ref 5–15)
BUN: 90 mg/dL — AB (ref 6–23)
CALCIUM: 9.3 mg/dL (ref 8.4–10.5)
CO2: 27 mEq/L (ref 19–32)
CREATININE: 4.37 mg/dL — AB (ref 0.50–1.35)
Chloride: 96 mEq/L (ref 96–112)
GFR calc Af Amer: 14 mL/min — ABNORMAL LOW (ref >90)
GFR, EST NON AFRICAN AMERICAN: 12 mL/min — AB (ref >90)
GLUCOSE: 145 mg/dL — AB (ref 70–99)
POTASSIUM: 2.7 meq/L — AB (ref 3.7–5.3)
Sodium: 138 mEq/L (ref 137–147)

## 2013-11-02 LAB — CBC WITH DIFFERENTIAL/PLATELET
BASOS PCT: 1 % (ref 0–1)
Basophils Absolute: 0.1 10*3/uL (ref 0.0–0.1)
EOS ABS: 0.3 10*3/uL (ref 0.0–0.7)
EOS PCT: 3 % (ref 0–5)
HCT: 28.3 % — ABNORMAL LOW (ref 39.0–52.0)
Hemoglobin: 10 g/dL — ABNORMAL LOW (ref 13.0–17.0)
LYMPHS ABS: 1.6 10*3/uL (ref 0.7–4.0)
Lymphocytes Relative: 21 % (ref 12–46)
MCH: 30.7 pg (ref 26.0–34.0)
MCHC: 35.3 g/dL (ref 30.0–36.0)
MCV: 86.8 fL (ref 78.0–100.0)
Monocytes Absolute: 0.5 10*3/uL (ref 0.1–1.0)
Monocytes Relative: 7 % (ref 3–12)
Neutro Abs: 4.9 10*3/uL (ref 1.7–7.7)
Neutrophils Relative %: 68 % (ref 43–77)
PLATELETS: 196 10*3/uL (ref 150–400)
RBC: 3.26 MIL/uL — AB (ref 4.22–5.81)
RDW: 14.4 % (ref 11.5–15.5)
WBC: 7.3 10*3/uL (ref 4.0–10.5)

## 2013-11-02 LAB — TROPONIN I: Troponin I: <0.3 ng/mL (ref <0.30)

## 2013-11-02 LAB — PRO B NATRIURETIC PEPTIDE: Pro B Natriuretic peptide (BNP): 62727 pg/mL — ABNORMAL HIGH (ref 0–125)

## 2013-11-02 MED ORDER — CLONIDINE HCL 0.2 MG PO TABS: 0.3000 mg | ORAL_TABLET | Freq: Two times a day (BID) | ORAL | Status: DC

## 2013-11-02 MED ORDER — CLONIDINE HCL 0.1 MG PO TABS: 0.3000 mg | ORAL_TABLET | Freq: Two times a day (BID) | ORAL | Status: DC

## 2013-11-02 MED ORDER — POTASSIUM CHLORIDE CRYS ER 20 MEQ PO TBCR
40.0000 meq | EXTENDED_RELEASE_TABLET | Freq: Once | ORAL | Status: AC
Start: 2013-11-02 — End: 2013-11-02
  Administered 2013-11-02: 40 meq via ORAL
  Filled 2013-11-02: qty 2

## 2013-11-02 MED ORDER — TORSEMIDE 20 MG PO TABS: 100.0000 mg | ORAL_TABLET | Freq: Two times a day (BID) | ORAL | Status: DC

## 2013-11-02 MED ORDER — ONDANSETRON HCL 4 MG/2ML IJ SOLN: 4.0000 mg | Freq: Four times a day (QID) | INTRAMUSCULAR | Status: DC | PRN

## 2013-11-02 MED ORDER — INSULIN GLARGINE 100 UNIT/ML ~~LOC~~ SOLN
10.0000 [IU] | Freq: Every day | SUBCUTANEOUS | Status: DC
  Administered 2013-11-03: 10 [IU] via SUBCUTANEOUS
  Filled 2013-11-02 (×2): qty 0.1

## 2013-11-02 MED ORDER — HYDRALAZINE HCL 25 MG PO TABS: 100.0000 mg | ORAL_TABLET | Freq: Two times a day (BID) | ORAL | Status: DC

## 2013-11-02 MED ORDER — FEBUXOSTAT 40 MG PO TABS
40.0000 mg | ORAL_TABLET | Freq: Every day | ORAL | Status: DC
  Filled 2013-11-02 (×2): qty 1

## 2013-11-02 MED ORDER — POTASSIUM CHLORIDE CRYS ER 20 MEQ PO TBCR: 40.0000 meq | EXTENDED_RELEASE_TABLET | Freq: Once | ORAL | Status: DC

## 2013-11-02 MED ORDER — ACETAMINOPHEN 325 MG PO TABS: 650.0000 mg | ORAL_TABLET | ORAL | Status: DC | PRN

## 2013-11-02 MED ORDER — HEPARIN SODIUM (PORCINE) 5000 UNIT/ML IJ SOLN
5000.0000 [IU] | Freq: Three times a day (TID) | INTRAMUSCULAR | Status: DC
  Administered 2013-11-03: 5000 [IU] via SUBCUTANEOUS
  Filled 2013-11-02: qty 1

## 2013-11-02 MED ORDER — POTASSIUM CHLORIDE CRYS ER 20 MEQ PO TBCR: 20.0000 meq | EXTENDED_RELEASE_TABLET | Freq: Every day | ORAL | Status: DC

## 2013-11-02 MED ORDER — METOPROLOL SUCCINATE ER 25 MG PO TB24: 25.0000 mg | ORAL_TABLET | Freq: Every day | ORAL | Status: DC

## 2013-11-02 MED ORDER — POTASSIUM CHLORIDE 10 MEQ/100ML IV SOLN
10.0000 meq | INTRAVENOUS | Status: AC
  Administered 2013-11-02 – 2013-11-03 (×3): 10 meq via INTRAVENOUS
  Filled 2013-11-02 (×3): qty 100

## 2013-11-02 MED ORDER — ASPIRIN EC 81 MG PO TBEC: 81.0000 mg | DELAYED_RELEASE_TABLET | Freq: Every day | ORAL | Status: DC

## 2013-11-02 MED ORDER — COLCHICINE 0.6 MG PO TABS: 0.6000 mg | ORAL_TABLET | Freq: Every day | ORAL | Status: DC

## 2013-11-02 NOTE — H&P (Signed)
[facility] History and Physical  [name] BMW:[id number] DOB: 1945/10/26 DOA: 11/02/2013  Referring physician: EDP PCP: Odette Fraction, MD   Chief Complaint: Chest pain   HPI: [name] is a 68 y.o. male who presents to the ED with c/o chest pain.  Patient reports "heaviness" quality symptoms located in the left side of his chest.  Symptoms accompanied by SOB and palpitations.  Symptoms have been on and off throughout the day today.  Pain radiates to left arm.  Review of Systems: Systems reviewed.  As above, otherwise negative  Past Medical History  Diagnosis Date  . Congestive heart failure 10/2008    preserved LV systolic function   . Aortic insufficiency 09/2008    Normal left ventricular size and function : graded as mild by Doppler , but pulse pressure is widened  . 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 Hypertension   . 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 Erectile dysfunction   . Colonic polyp   . Low serum testosterone level   . Tobacco abuse   . Sleep apnea 2010    Initially unable to afford CPAP  . Benign prostatic hypertrophy   . Obesity, Class I, BMI 30-34.9   . Hyperlipidemia   . Congestive heart failure (CHF)     EF 25-30% 07/2011  . Adenomatous colon polyp 2006  . H. pylori infection 2013    treated with prevpac   Past Surgical History  Procedure Laterality Date  . Retinal laser procedure      diabetic retinopathy  . Lymph node biopsy      surgical exploration of neck-not entirely clear that this represented a lymph node biopsy  . Colonoscopy  08/23/2004    Dr. Vivi Ferns rectum, diminutive polyp of the rectosigmoid removed, inflamed focally adenomatous polyp   Social History:  reports that he has been smoking Cigarettes.  He has been smoking about 0.00 packs per day for the past 45 years. He uses smokeless tobacco. He reports that he drinks  about 2.4 ounces of alcohol per week. He reports that he does not use illicit drugs.  Allergies  Allergen Reactions  . Ace Inhibitors Cough    REACTION: cough    Family History  Problem Relation Age of Onset  . Hypertension Mother   . Cancer Mother   . Cancer Father   . Diabetes Sister      Prior to Admission medications   Medication Sig Start Date End Date Taking? Authorizing Provider  aspirin EC 81 MG tablet Take 81 mg by mouth daily.   Yes Historical Provider, MD  cloNIDine (CATAPRES) 0.3 MG tablet Take 0.3 mg by mouth 2 (two) times daily. 07/12/11 11/02/13 Yes Fayrene Helper, MD  Colchicine 0.6 MG CAPS Take 0.6 mg by mouth daily.   Yes Historical Provider, MD  febuxostat (ULORIC) 40 MG tablet Take 1 tablet (40 mg total) by mouth daily. 04/11/13  Yes Alycia Rossetti, MD  hydrALAZINE (APRESOLINE) 100 MG tablet Take 100 mg by mouth 2 (two) times daily.   Yes Historical Provider, MD  Insulin Glargine (LANTUS SOLOSTAR) 100 UNIT/ML Solostar Pen Inject 10 Units into the skin daily at 10 pm. 07/11/13  Yes Alycia Rossetti, MD  metolazone (ZAROXOLYN) 2.5 MG tablet Take 2.5 mg by mouth daily as needed (for swelling/shortness of breath).   Yes Historical  Provider, MD  metoprolol succinate (TOPROL XL) 25 MG 24 hr tablet Take 1 tablet (25 mg total) by mouth daily. 05/30/13  Yes Lendon Colonel, NP  potassium chloride SA (K-DUR,KLOR-CON) 20 MEQ tablet Take 20 mEq by mouth daily.   Yes Historical Provider, MD  torsemide (DEMADEX) 100 MG tablet Take 100 mg by mouth 2 (two) times daily.    Yes Historical Provider, MD   Physical Exam: Filed Vitals:   11/02/13 2130  BP: 170/48  Pulse: 63  Resp: 17    BP 170/48  Pulse 63  Resp 17  SpO2 98%  General Appearance:    Alert, oriented, no distress, appears stated age  Head:    Normocephalic, atraumatic  Eyes:    PERRL, EOMI, sclera non-icteric        Nose:   Nares without drainage or epistaxis. Mucosa, turbinates normal  Throat:   Moist  mucous membranes. Oropharynx without erythema or exudate.  Neck:   Supple. No carotid bruits.  No thyromegaly.  No lymphadenopathy.   Back:     No CVA tenderness, no spinal tenderness  Lungs:     Clear to auscultation bilaterally, without wheezes, rhonchi or rales  Chest wall:    No tenderness to palpitation  Heart:    Regular rate and rhythm at least 4/6 murmur with palpable thrill.  Abdomen:     Soft, non-tender, nondistended, normal bowel sounds, no organomegaly  Genitalia:    deferred  Rectal:    deferred  Extremities:   No clubbing, cyanosis or edema.  Pulses:   2+ and symmetric all extremities  Skin:   Skin color, texture, turgor normal, no rashes or lesions  Lymph nodes:   Cervical, supraclavicular, and axillary nodes normal  Neurologic:   CNII-XII intact. Normal strength, sensation and reflexes      throughout    Labs on Admission:  Basic Metabolic Panel:  Recent Labs Lab 10/28/13 0847 11/02/13 2047  NA 137 138  K 3.4* 2.7*  CL 101 96  CO2 23 27  GLUCOSE 127* 145*  BUN 86* 90*  CREATININE 4.59* 4.37*  CALCIUM 9.0 9.3   Liver Function Tests:  Recent Labs Lab 10/28/13 0847  AST 11  ALT <8  ALKPHOS 55  BILITOT 0.4  PROT 5.7*  ALBUMIN 3.7   No results found for this basename: LIPASE, AMYLASE,  in the last 168 hours No results found for this basename: AMMONIA,  in the last 168 hours CBC:  Recent Labs Lab 11/02/13 2047  WBC 7.3  NEUTROABS 4.9  HGB 10.0*  HCT 28.3*  MCV 86.8  PLT 196   Cardiac Enzymes:  Recent Labs Lab 11/02/13 2047  TROPONINI <0.30    BNP (last 3 results)  Recent Labs  11/02/13 2047  PROBNP 62727.0*   CBG: No results found for this basename: GLUCAP,  in the last 168 hours  Radiological Exams on Admission: Dg Chest Port 1 View  11/02/2013   CLINICAL DATA:  Chest pain for 2 days.  Initial encounter  EXAM: PORTABLE CHEST - 1 VIEW  COMPARISON:  10/10/2013  FINDINGS: There is moderate cardiomegaly. Apparent increase in size  may be related to differences in technique. Chronic mild tortuosity of the aorta. Suggestion of cephalized blood flow but no pulmonary edema. No pneumonia, effusion, or pneumothorax.  IMPRESSION: Cardiomegaly without failure.   Electronically Signed   By: Jorje Guild M.D.   On: 11/02/2013 21:29    EKG: Independently reviewed.  Assessment/Plan Principal Problem:  Chest pain Active Problems:   Diabetes mellitus type 2 with retinopathy   Aortic valve disease   Chronic kidney disease, stage 4, severely decreased GFR   Chronic systolic congestive heart failure   Hypokalemia   1. Chest pain - 1. Serial trops 2. Tele monitor 3. Will go ahead and let patient eat since no stress tests on weekend at [facility] 4. 2d echo ordered 2. Aortic insufficiency - was "mild" on echo previously but patient has very impressive murmur and pulse pressure is extremely wide for a "mild" insufficiency. 1. Will re-evaluate with 2d echo 3. CKD stage 4 - current kidney numbers appear to be baseline 1. Patient following with nephrology 2. Nephrology wants patient to get fistula but patient has refused. 4. DM2 - 1. Continue home lantus 2. CBG checks AC/HS 5. Chronic systolic CHF - 1. Continue torsemide 6. Hypokalemia 1. Got 58meq PO in ED 2. Adding 17meq IV 3. Recheck potassium in AM.   Code Status: Full Code  Family Communication: Wife at bedside Disposition Plan: Admit to obs   Time spent: 70 min  GARDNER, JARED M. [facility] Pager [phone]  If 7AM-7PM, please contact the day team taking care of the patient [email or web address] Password [email or web address] 11/02/2013, 10:29 PM

## 2013-11-02 NOTE — ED Notes (Addendum)
CRITICAL VALUE ALERT  Critical value received:  Potassium 2.7  Date of notification:  11/02/2013  Time of notification:  2130  Critical value read back:Yes.    Nurse who received alert:  Lucy Antigua, RN  MD notified (1st page):  Dr. Betsey Holiday  Time MD responded:  2132

## 2013-11-02 NOTE — ED Provider Notes (Signed)
CSN: [id number]     Arrival date & time 11/02/13  2033 History   First MD Initiated Contact with Patient 11/02/13 2040     Chief Complaint  Patient presents with  . Chest Pain     (Consider location/radiation/quality/duration/timing/severity/associated sxs/prior Treatment) HPI Comments: Patient presents to the ER for evaluation of chest pain. Patient reports that he has been having intermittent episodes of heaviness in the left side of his chest accompanied by shortness of breath and heart palpitations. He reports that the symptoms have been on and off, recurrent throughout the day. Pain radiates to the left arm.  Patient is a 68 y.o. male presenting with chest pain.  Chest Pain Associated symptoms: palpitations and shortness of breath     Past Medical History  Diagnosis Date  . Congestive heart failure 10/2008    preserved LV systolic function   . Aortic insufficiency 09/2008    Normal left ventricular size and function : graded as mild by Doppler , but pulse pressure is widened  . 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 Hypertension   . 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 Erectile dysfunction   . Colonic polyp   . Low serum testosterone level   . Tobacco abuse   . Sleep apnea 2010    Initially unable to afford CPAP  . Benign prostatic hypertrophy   . Obesity, Class I, BMI 30-34.9   . Hyperlipidemia   . Congestive heart failure (CHF)     EF 25-30% 07/2011  . Adenomatous colon polyp 2006  . H. pylori infection 2013    treated with prevpac   Past Surgical History  Procedure Laterality Date  . Retinal laser procedure      diabetic retinopathy  . Lymph node biopsy      surgical exploration of neck-not entirely clear that this represented a lymph node biopsy  . Colonoscopy  08/23/2004    Dr. Vivi Ferns rectum, diminutive polyp of the rectosigmoid removed, inflamed focally  adenomatous polyp   Family History  Problem Relation Age of Onset  . Hypertension Mother   . Cancer Mother   . Cancer Father   . Diabetes Sister    History  Substance Use Topics  . Smoking status: Current Some Day Smoker -- 45 years    Types: Cigarettes  . Smokeless tobacco: Current User     Comment: 1-2 cigarettes OF THE NEW ELECTRIC CIGARETTES  . Alcohol Use: 2.4 oz/week    2 Glasses of wine, 2 Shots of liquor per week     Comment: occ    Review of Systems  Respiratory: Positive for shortness of breath.   Cardiovascular: Positive for chest pain and palpitations.  All other systems reviewed and are negative.     Allergies  Ace inhibitors  Home Medications   Prior to Admission medications   Medication Sig Start Date End Date Taking? Authorizing Provider  aspirin EC 81 MG tablet Take 81 mg by mouth daily.   Yes Historical Provider, MD  cloNIDine (CATAPRES) 0.3 MG tablet Take 0.3 mg by mouth 2 (two) times daily. 07/12/11 11/02/13 Yes Fayrene Helper, MD  Colchicine 0.6 MG CAPS Take 0.6 mg by mouth daily.   Yes Historical Provider, MD  febuxostat (ULORIC) 40 MG tablet Take 1 tablet (40 mg total) by mouth daily. 04/11/13  Yes Alycia Rossetti, MD  hydrALAZINE (APRESOLINE) 100 MG tablet Take 100 mg by mouth 2 (two) times daily.   Yes Historical Provider, MD  Insulin Glargine (LANTUS SOLOSTAR) 100 UNIT/ML Solostar Pen Inject 10 Units into the skin daily at 10 pm. 07/11/13  Yes Alycia Rossetti, MD  metolazone (ZAROXOLYN) 2.5 MG tablet Take 2.5 mg by mouth daily as needed (for swelling/shortness of breath).   Yes Historical Provider, MD  metoprolol succinate (TOPROL XL) 25 MG 24 hr tablet Take 1 tablet (25 mg total) by mouth daily. 05/30/13  Yes Lendon Colonel, NP  potassium chloride SA (K-DUR,KLOR-CON) 20 MEQ tablet Take 20 mEq by mouth daily.   Yes Historical Provider, MD  torsemide (DEMADEX) 100 MG tablet Take 100 mg by mouth 2 (two) times daily.    Yes Historical Provider,  MD   BP 170/48  Pulse 63  Resp 17  SpO2 98% Physical Exam  Constitutional: He is oriented to person, place, and time. He appears well-developed and well-nourished. No distress.  HENT:  Head: Normocephalic and atraumatic.  Right Ear: Hearing normal.  Left Ear: Hearing normal.  Nose: Nose normal.  Mouth/Throat: Oropharynx is clear and moist and mucous membranes are normal.  Eyes: Conjunctivae and EOM are normal. Pupils are equal, round, and reactive to light.  Neck: Normal range of motion. Neck supple.  Cardiovascular: Regular rhythm, S1 normal and S2 normal.  Exam reveals no gallop and no friction rub.   No murmur heard. Pulmonary/Chest: Effort normal and breath sounds normal. No respiratory distress. He exhibits no tenderness.  Abdominal: Soft. Normal appearance and bowel sounds are normal. There is no hepatosplenomegaly. There is no tenderness. There is no rebound, no guarding, no tenderness at McBurney's point and negative Murphy's sign. No hernia.  Musculoskeletal: Normal range of motion.  Neurological: He is alert and oriented to person, place, and time. He has normal strength. No cranial nerve deficit or sensory deficit. Coordination normal. GCS eye subscore is 4. GCS verbal subscore is 5. GCS motor subscore is 6.  Skin: Skin is warm, dry and intact. No rash noted. No cyanosis.  Psychiatric: He has a normal mood and affect. His speech is normal and behavior is normal. Thought content normal.    ED Course  Procedures (including critical care time) Labs Review Labs Reviewed  BASIC METABOLIC PANEL - Abnormal; Notable for the following:    Potassium 2.7 (*)    Glucose, Bld 145 (*)    BUN 90 (*)    Creatinine, Ser 4.37 (*)    GFR calc non Af Amer 12 (*)    GFR calc Af Amer 14 (*)    All other components within normal limits  CBC WITH DIFFERENTIAL - Abnormal; Notable for the following:    RBC 3.26 (*)    Hemoglobin 10.0 (*)    HCT 28.3 (*)    All other components within  normal limits  PRO B NATRIURETIC PEPTIDE - Abnormal; Notable for the following:    Pro B Natriuretic peptide (BNP) 62727.0 (*)    All other components within normal limits  TROPONIN I    Imaging Review Dg Chest Port 1 View  11/02/2013   CLINICAL DATA:  Chest pain for 2 days.  Initial encounter  EXAM: PORTABLE CHEST - 1 VIEW  COMPARISON:  10/10/2013  FINDINGS: There is moderate cardiomegaly. Apparent increase in size may be related to differences in technique. Chronic mild tortuosity of the aorta. Suggestion of cephalized blood flow but no pulmonary edema. No pneumonia, effusion, or  pneumothorax.  IMPRESSION: Cardiomegaly without failure.   Electronically Signed   By: Jorje Guild M.D.   On: 11/02/2013 21:29     EKG Interpretation   Date/Time:  Saturday November 02 2013 20:43:06 EDT Ventricular Rate:  70 PR Interval:  177 QRS Duration: 123 QT Interval:  454 QTC Calculation: 490 R Axis:   46 Text Interpretation:  Sinus rhythm Left bundle branch block No significant  change since last tracing Confirmed by POLLINA  MD, CHRISTOPHER [phone]) on  11/02/2013 8:58:12 PM      MDM   Final diagnoses:  Chest pain    Patient presents to the ER for evaluation of shortness of breath, chest discomfort, palpitations. Patient reports a history of congestive heart failure. He has chronic renal insufficiency. [name] renal function is approximately his baseline. He does have a markedly elevated BNP. He does have 1+ pitting edema bilaterally. He is not in any significant respiratory distress. Have not seen any arrhythmia here in the ER, but he does describe fast heart rate was pounding in his chest that when he had the episodes. This has been intermittent through the day. He will require observation for arrhythmia. He will require serial enzymes to rule out MI. Patient admitted to the hospitalist service.    Orpah Greek, MD 11/02/13 2217

## 2013-11-02 NOTE — ED Notes (Signed)
Pt reports heaviness in his chest on and off all day with some sob and feeling weak, states he feels like his heart is racing at times.  Pt also report left arm pain today

## 2013-11-03 DIAGNOSIS — R0789 Other chest pain: Secondary | ICD-10-CM

## 2013-11-03 DIAGNOSIS — I10 Essential (primary) hypertension: Secondary | ICD-10-CM

## 2013-11-03 LAB — BASIC METABOLIC PANEL
Anion gap: 15 (ref 5–15)
BUN: 87 mg/dL — AB (ref 6–23)
CHLORIDE: 99 meq/L (ref 96–112)
CO2: 25 mEq/L (ref 19–32)
CREATININE: 4.18 mg/dL — AB (ref 0.50–1.35)
Calcium: 9.2 mg/dL (ref 8.4–10.5)
GFR calc non Af Amer: 13 mL/min — ABNORMAL LOW (ref >90)
GFR, EST AFRICAN AMERICAN: 15 mL/min — AB (ref >90)
GLUCOSE: 133 mg/dL — AB (ref 70–99)
Potassium: 3.2 mEq/L — ABNORMAL LOW (ref 3.7–5.3)
Sodium: 139 mEq/L (ref 137–147)

## 2013-11-03 LAB — GLUCOSE, CAPILLARY
GLUCOSE-CAPILLARY: 133 mg/dL — AB (ref 70–99)
Glucose-Capillary: 120 mg/dL — ABNORMAL HIGH (ref 70–99)
Glucose-Capillary: 165 mg/dL — ABNORMAL HIGH (ref 70–99)

## 2013-11-03 LAB — TROPONIN I: Troponin I: <0.3 ng/mL (ref <0.30)

## 2013-11-03 NOTE — Progress Notes (Signed)
PROGRESS NOTE  [name] VEL:[id number] DOB: [birth year] DOA: 11/02/2013 PCP: Harold Fraction, MD  Summary: 43yom presented to ED with h/o chest pain and palpitations. Admitted for cardiac evaluation.  Assessment/Plan: 1. Atypical CP with palpitations without documented arrythmia. Resolved, troponins negative, EKG non-acute; ruled out for ACS. Very atypical, no further evaluation suggested. 2. Murmur. Intermittently documented in past (but see Dr. Winnifred [name] last office note). H/o aortic insufficiency. Exam consistent with history and Dr. Winnifred [name] assessment; no indication to pursue echo at this time.  3. Elevated BNP--quite high, but also quite high in past. Does have h/o diastolic dysfuntion, however, has no LE edema, no SOB, no findings on CXR--this is likely a reflected of CKD. No further evaluation suggested. No evidence of acute heart failure. 4. DM with retinopathy, nephropathy well-controlled. Last Hgb A1c <7. 5. Anemia of chronic disease, stable. 6. CKD stage IV-V, stable.   Appears well, has ruled out for ACS. Further testing not indicated at this point. Plan discharge home. Continue ASA, metoprolol, PRN metolazone, BID torsemide  Will send a message to his heart team for follow-up  Discussed in detail with wife and patient at bedside.  Harold Hodgkins, MD  [facility]  Pager [phone] If 7PM-7AM, please contact night-coverage at [email or web address], password [facility] 11/03/2013, 7:14 AM  LOS: 1 day   Consultants:    Procedures:    Antibiotics:    HPI/Subjective: Feels good today, no recurrent chest pain, no palpitations. No SOB, left arm/neck/jaw pain. No n/v. "Can I go home?" Had very mild low sternal pain yesterday, intermittent, some palpitations, no SOB, n/v. No recent symptoms prior. Quite active at baseline, still works part time driving trucks. Activity not limited at home.  Reports long history of isolated systolic HTN with low normal diastolic. This is  indeed reflected in the chart on previous office visits  Objective: Filed Vitals:   11/02/13 2230 11/02/13 2300 11/03/13 0006 11/03/13 0601  BP: 171/48 168/50 169/43 160/42  Pulse: 61 63 71 62  Temp:   97.7 F (36.5 C) 97.9 F (36.6 C)  TempSrc:   Oral Oral  Resp: 14 14 16 16   Height:   5\' 11"  (1.803 m)   Weight:   88.7 kg (195 lb 8.8 oz)   SpO2: 98% 98% 99% 100%   No intake or output data in the 24 hours ending 11/03/13 0714   Filed Weights   11/03/13 0006  Weight: 88.7 kg (195 lb 8.8 oz)    Exam:     Afebrile, VSS, no hypoxia  General: appears calm and comfortable  Psych: alert, speech fluent and clear  CV: RRR 3/6 systolic murmur, no r/g. No LE edema. Telemetry SR.  Respiratory: CTA bilaterally no w/r/r. Normal resp effort  Data Reviewed:  CBG stable  troponins negative  K+ up to 3.2  BUN/creatinine improved over baseline  EKG SR LBBB vs LVH with repolarization abnormality, no acute changes, NSCSLT 07/16/2013  Scheduled Meds: . aspirin EC  81 mg Oral Daily  . cloNIDine  0.3 mg Oral BID  . colchicine  0.6 mg Oral Daily  . febuxostat  40 mg Oral Daily  . heparin  5,000 Units Subcutaneous 3 times per day  . hydrALAZINE  100 mg Oral BID  . insulin glargine  10 Units Subcutaneous Q2200  . metoprolol succinate  25 mg Oral Daily  . potassium chloride SA  20 mEq Oral Daily  . torsemide  100 mg Oral BID   Continuous Infusions:   Principal Problem:  Chest pain Active Problems:   Diabetes mellitus type 2 with retinopathy   Aortic valve disease   Chronic kidney disease, stage 4, severely decreased GFR   Chronic systolic congestive heart failure   Hypokalemia

## 2013-11-03 NOTE — Discharge Summary (Signed)
Physician Discharge Summary  [name] IFB:[id number] DOB: Mar 07, 1945 DOA: 11/02/2013  PCP: Odette Fraction, MD  Admit date: 11/02/2013 Discharge date: 11/03/2013  Recommendations for Outpatient Follow-up:  1. Atypical chest pain--no further evaluation suggested at this point. sent message apprising [name] with [facility] of admission    Follow-up Information   Follow up with [facility] TOM, MD. Schedule an appointment as soon as possible for a visit in 2 weeks.   Specialty:  Family Medicine   Contact information:   [address] [place] [address] [phone]      Discharge Diagnoses:  1. Atypical chest pain 2. Murmur, h/o aortic insufficiency  3. Isolated systolic HTN  4. DM type 2 with retinopathy, nephropathy 5. Anemia of chronic disease 6. CKD stage IV-V  Discharge Condition: improved Disposition: home   Diet recommendation: heart healthy, diabetic diet  Filed Weights   11/03/13 0006  Weight: 88.7 kg (195 lb 8.8 oz)    History of present illness:  68yom presented to ED with h/o chest pain and palpitations. Admitted for cardiac evaluation.  Hospital Course:  [name] was observed overnight, had no recurrence of pain or palpitations. Ruled out for ACS, telemetry unremarkable. Hospitalization uncomplicated. Pain very atypical, consider GERD.  1. Atypical CP with palpitations without documented arrythmia. Resolved, troponins negative, EKG non-acute; ruled out for ACS. Very atypical, no further evaluation suggested. 2. Murmur. Intermittently documented in past (but see Dr. Winnifred Friar last office note). H/o aortic insufficiency. Exam consistent with history and Dr. Winnifred Friar assessment; no indication to pursue echo at this time.  3. Elevated BNP--quite high, but also quite high in past. Does have h/o diastolic dysfuntion, however, has no LE edema, no SOB, no findings on CXR--this is likely a reflected of CKD. No further evaluation suggested. No  evidence of acute heart failure. 4. DM with retinopathy, nephropathy well-controlled. Last Hgb A1c <7. 5. Anemia of chronic disease, stable. 6. CKD stage IV-V, stable. Appears well, has ruled out for ACS. Further testing not indicated at this point. Plan discharge home. Continue ASA, metoprolol, PRN metolazone, BID torsemide   Consultants: none Procedures: none  Discharge Instructions  Discharge Instructions   Activity as tolerated - No restrictions    Complete by:  As directed      Diet - low sodium heart healthy    Complete by:  As directed      Diet Carb Modified    Complete by:  As directed      Discharge instructions    Complete by:  As directed   Call your physician or seek immediate medical attention for chest pain, shortness of breath, palpitations or worsening of condition.          Current Discharge Medication List    CONTINUE these medications which have NOT CHANGED   Details  aspirin EC 81 MG tablet Take 81 mg by mouth daily.    cloNIDine (CATAPRES) 0.3 MG tablet Take 0.3 mg by mouth 2 (two) times daily.    Colchicine 0.6 MG CAPS Take 0.6 mg by mouth daily.    febuxostat (ULORIC) 40 MG tablet Take 1 tablet (40 mg total) by mouth daily. Qty: 30 tablet, Refills: 2    hydrALAZINE (APRESOLINE) 100 MG tablet Take 100 mg by mouth 2 (two) times daily.    Insulin Glargine (LANTUS SOLOSTAR) 100 UNIT/ML Solostar Pen Inject 10 Units into the skin daily at 10 pm. Qty: 5 pen, Refills: PRN    metolazone (ZAROXOLYN)  2.5 MG tablet Take 2.5 mg by mouth daily as needed (for swelling/shortness of breath).    metoprolol succinate (TOPROL XL) 25 MG 24 hr tablet Take 1 tablet (25 mg total) by mouth daily. Qty: 30 tablet, Refills: 6    potassium chloride SA (K-DUR,KLOR-CON) 20 MEQ tablet Take 20 mEq by mouth daily.    torsemide (DEMADEX) 100 MG tablet Take 100 mg by mouth 2 (two) times daily.        Allergies  Allergen Reactions  . Ace Inhibitors Cough    REACTION: cough      The results of significant diagnostics from this hospitalization (including imaging, microbiology, ancillary and laboratory) are listed below for reference.    Significant Diagnostic Studies: Dg Chest Port 1 View  11/02/2013   CLINICAL DATA:  Chest pain for 2 days.  Initial encounter  EXAM: PORTABLE CHEST - 1 VIEW  COMPARISON:  10/10/2013  FINDINGS: There is moderate cardiomegaly. Apparent increase in size may be related to differences in technique. Chronic mild tortuosity of the aorta. Suggestion of cephalized blood flow but no pulmonary edema. No pneumonia, effusion, or pneumothorax.  IMPRESSION: Cardiomegaly without failure.   Electronically Signed   By: Jorje Guild M.D.   On: 11/02/2013 21:29    Labs: Basic Metabolic Panel:  Recent Labs Lab 10/28/13 0847 11/02/13 2047 11/03/13 0529  NA 137 138 139  K 3.4* 2.7* 3.2*  CL 101 96 99  CO2 23 27 25   GLUCOSE 127* 145* 133*  BUN 86* 90* 87*  CREATININE 4.59* 4.37* 4.18*  CALCIUM 9.0 9.3 9.2   Liver Function Tests:  Recent Labs Lab 10/28/13 0847  AST 11  ALT <8  ALKPHOS 55  BILITOT 0.4  PROT 5.7*  ALBUMIN 3.7   CBC:  Recent Labs Lab 11/02/13 2047  WBC 7.3  NEUTROABS 4.9  HGB 10.0*  HCT 28.3*  MCV 86.8  PLT 196   Cardiac Enzymes:  Recent Labs Lab 11/02/13 2047 11/02/13 2248 11/03/13 0529  TROPONINI <0.30 <0.30 <0.30     Recent Labs  11/02/13 2047  PROBNP 62727.0*   CBG:  Recent Labs Lab 11/03/13 0009 11/03/13 0608 11/03/13 0730  GLUCAP 165* 120* 133*    Principal Problem:   Chest pain Active Problems:   Diabetes mellitus type 2 with retinopathy   Aortic valve disease   Chronic kidney disease, stage 4, severely decreased GFR   Chronic systolic congestive heart failure   Hypokalemia   Time coordinating discharge: 25 minutes  Signed:  [name], MD [facility] 11/03/2013, 8:25 AM

## 2013-11-03 NOTE — Progress Notes (Signed)
Pt and wife received discharge instructions, had no concerns. Pt in stable condition. IV removed (clean, dry, and intact). Stressed the importance of adhering to low sodium diet and smoking cessation. Pt gathered all belongings. Pt requested to walk out to vehicle, and was accompanied by Jovita Kussmaul, RN.

## 2013-11-26 ENCOUNTER — Encounter (HOSPITAL_COMMUNITY)
Admission: RE | Admit: 2013-11-26 | Discharge: 2013-11-26 | Disposition: A | Discharge status: Home / Self Care | Payer: Medicare Other | Source: Ambulatory Visit | Attending: Nephrology | Admitting: Nephrology

## 2013-11-26 ENCOUNTER — Encounter (HOSPITAL_COMMUNITY): Payer: Self-pay

## 2013-11-26 ENCOUNTER — Telehealth: Payer: Self-pay | Admitting: Family Medicine

## 2013-11-26 DIAGNOSIS — N184 Chronic kidney disease, stage 4 (severe): Secondary | ICD-10-CM | POA: Insufficient documentation

## 2013-11-26 DIAGNOSIS — D631 Anemia in chronic kidney disease: Secondary | ICD-10-CM | POA: Diagnosis not present

## 2013-11-26 LAB — HEMOGLOBIN AND HEMATOCRIT, BLOOD
HCT: 29 % — ABNORMAL LOW (ref 39.0–52.0)
Hemoglobin: 9.9 g/dL — ABNORMAL LOW (ref 13.0–17.0)

## 2013-11-26 LAB — IRON AND TIBC
Iron: 55 ug/dL (ref 42–135)
Saturation Ratios: 24 % (ref 20–55)
TIBC: 227 ug/dL (ref 215–435)
UIBC: 172 ug/dL (ref 125–400)

## 2013-11-26 LAB — FERRITIN: FERRITIN: 167 ng/mL (ref 22–322)

## 2013-11-26 MED ORDER — SODIUM CHLORIDE 0.9 % IV SOLN
1020.0000 mg | Freq: Once | INTRAVENOUS | Status: AC
  Administered 2013-11-26: 1020 mg via INTRAVENOUS
  Filled 2013-11-26: qty 34

## 2013-11-26 MED ORDER — DARBEPOETIN ALFA 100 MCG/0.5ML IJ SOSY
100.0000 ug | PREFILLED_SYRINGE | INTRAMUSCULAR | Status: DC
  Administered 2013-11-26: 100 ug via SUBCUTANEOUS
  Filled 2013-11-26: qty 0.5

## 2013-11-26 MED ORDER — SODIUM CHLORIDE 0.9 % IV SOLN
Freq: Once | INTRAVENOUS | Status: AC
  Administered 2013-11-26: 250 mL via INTRAVENOUS

## 2013-11-26 NOTE — Discharge Instructions (Signed)
Ferumoxytol injection What is this medicine? FERUMOXYTOL is an iron complex. Iron is used to make healthy red blood cells, which carry oxygen and nutrients throughout the body. This medicine is used to treat iron deficiency anemia in people with chronic kidney disease. This medicine may be used for other purposes; ask your health care provider or pharmacist if you have questions. COMMON BRAND NAME(S): Feraheme What should I tell my health care provider before I take this medicine? They need to know if you have any of these conditions: -anemia not caused by low iron levels -high levels of iron in the blood -magnetic resonance imaging (MRI) test scheduled -an unusual or allergic reaction to iron, other medicines, foods, dyes, or preservatives -pregnant or trying to get pregnant -breast-feeding How should I use this medicine? This medicine is for injection into a vein. It is given by a health care professional in a hospital or clinic setting. Talk to your pediatrician regarding the use of this medicine in children. Special care may be needed. Overdosage: If you think you've taken too much of this medicine contact a poison control center or emergency room at once. Overdosage: If you think you have taken too much of this medicine contact a poison control center or emergency room at once. NOTE: This medicine is only for you. Do not share this medicine with others. What if I miss a dose? It is important not to miss your dose. Call your doctor or health care professional if you are unable to keep an appointment. What may interact with this medicine? This medicine may interact with the following medications: -other iron products This list may not describe all possible interactions. Give your health care provider a list of all the medicines, herbs, non-prescription drugs, or dietary supplements you use. Also tell them if you smoke, drink alcohol, or use illegal drugs. Some items may interact with your  medicine. What should I watch for while using this medicine? Visit your doctor or healthcare professional regularly. Tell your doctor or healthcare professional if your symptoms do not start to get better or if they get worse. You may need blood work done while you are taking this medicine. You may need to follow a special diet. Talk to your doctor. Foods that contain iron include: whole grains/cereals, dried fruits, beans, or peas, leafy green vegetables, and organ meats (liver, kidney). What side effects may I notice from receiving this medicine? Side effects that you should report to your doctor or health care professional as soon as possible: -allergic reactions like skin rash, itching or hives, swelling of the face, lips, or tongue -breathing problems -changes in blood pressure -feeling faint or lightheaded, falls -fever or chills -flushing, sweating, or hot feelings -swelling of the ankles or feet Side effects that usually do not require medical attention (Report these to your doctor or health care professional if they continue or are bothersome.): -diarrhea -headache -nausea, vomiting -stomach pain This list may not describe all possible side effects. Call your doctor for medical advice about side effects. You may report side effects to FDA at 1-800-FDA-1088. Where should I keep my medicine? This drug is given in a hospital or clinic and will not be stored at home. NOTE: This sheet is a summary. It may not cover all possible information. If you have questions about this medicine, talk to your doctor, pharmacist, or health care provider.  2015, Elsevier/Gold Standard. (2011-08-19 15:23:36)   Darbepoetin Alfa injection What is this medicine? DARBEPOETIN ALFA (dar be POE   e tin AL fa) helps your body make more red blood cells. It is used to treat anemia caused by chronic kidney failure and chemotherapy. This medicine may be used for other purposes; ask your health care provider or  pharmacist if you have questions. COMMON BRAND NAME(S): Aranesp What should I tell my health care provider before I take this medicine? They need to know if you have any of these conditions: -blood clotting disorders or history of blood clots -cancer patient not on chemotherapy -cystic fibrosis -heart disease, such as angina, heart failure, or a history of a heart attack -hemoglobin level of 12 g/dL or greater -high blood pressure -low levels of folate, iron, or vitamin B12 -seizures -an unusual or allergic reaction to darbepoetin, erythropoietin, albumin, hamster proteins, latex, other medicines, foods, dyes, or preservatives -pregnant or trying to get pregnant -breast-feeding How should I use this medicine? This medicine is for injection into a vein or under the skin. It is usually given by a health care professional in a hospital or clinic setting. If you get this medicine at home, you will be taught how to prepare and give this medicine. Do not shake the solution before you withdraw a dose. Use exactly as directed. Take your medicine at regular intervals. Do not take your medicine more often than directed. It is important that you put your used needles and syringes in a special sharps container. Do not put them in a trash can. If you do not have a sharps container, call your pharmacist or healthcare provider to get one. Talk to your pediatrician regarding the use of this medicine in children. While this medicine may be used in children as young as 1 year for selected conditions, precautions do apply. Overdosage: If you think you have taken too much of this medicine contact a poison control center or emergency room at once. NOTE: This medicine is only for you. Do not share this medicine with others. What if I miss a dose? If you miss a dose, take it as soon as you can. If it is almost time for your next dose, take only that dose. Do not take double or extra doses. What may interact with  this medicine? Do not take this medicine with any of the following medications: -epoetin alfa This list may not describe all possible interactions. Give your health care provider a list of all the medicines, herbs, non-prescription drugs, or dietary supplements you use. Also tell them if you smoke, drink alcohol, or use illegal drugs. Some items may interact with your medicine. What should I watch for while using this medicine? Visit your prescriber or health care professional for regular checks on your progress and for the needed blood tests and blood pressure measurements. It is especially important for the doctor to make sure your hemoglobin level is in the desired range, to limit the risk of potential side effects and to give you the best benefit. Keep all appointments for any recommended tests. Check your blood pressure as directed. Ask your doctor what your blood pressure should be and when you should contact him or her. As your body makes more red blood cells, you may need to take iron, folic acid, or vitamin B supplements. Ask your doctor or health care provider which products are right for you. If you have kidney disease continue dietary restrictions, even though this medication can make you feel better. Talk with your doctor or health care professional about the foods you eat and the vitamins that   you take. What side effects may I notice from receiving this medicine? Side effects that you should report to your doctor or health care professional as soon as possible: -allergic reactions like skin rash, itching or hives, swelling of the face, lips, or tongue -breathing problems -changes in vision -chest pain -confusion, trouble speaking or understanding -feeling faint or lightheaded, falls -high blood pressure -muscle aches or pains -pain, swelling, warmth in the leg -rapid weight gain -severe headaches -sudden numbness or weakness of the face, arm or leg -trouble walking, dizziness, loss  of balance or coordination -seizures (convulsions) -swelling of the ankles, feet, hands -unusually weak or tired Side effects that usually do not require medical attention (report to your doctor or health care professional if they continue or are bothersome): -diarrhea -fever, chills (flu-like symptoms) -headaches -nausea, vomiting -redness, stinging, or swelling at site where injected This list may not describe all possible side effects. Call your doctor for medical advice about side effects. You may report side effects to FDA at 1-800-FDA-1088. Where should I keep my medicine? Keep out of the reach of children. Store in a refrigerator between 2 and 8 degrees C (36 and 46 degrees F). Do not freeze. Do not shake. Throw away any unused portion if using a single-dose vial. Throw away any unused medicine after the expiration date. NOTE: This sheet is a summary. It may not cover all possible information. If you have questions about this medicine, talk to your doctor, pharmacist, or health care provider.  2015, Elsevier/Gold Standard. (2007-12-18 10:23:57)  

## 2013-11-26 NOTE — Telephone Encounter (Signed)
Patient is calling to ask a second opinion about a hemoglobin shot that his kidney doctor is recommending  [phone]

## 2013-11-27 NOTE — Progress Notes (Signed)
Results for [name], [name] (MRN [id number]) as of 11/27/2013 10:13 Pt arrived for feraheme infusion along with these labs, Aranesp SQ given as ordered.  Tolerated well.    Return visit 3 weeks.   Ref. Range 11/26/2013 09:30  Iron Latest Range: 42-135 ug/dL 55  UIBC Latest Range: 125-400 ug/dL 172  TIBC Latest Range: 215-435 ug/dL 227  Saturation Ratios Latest Range: 20-55 % 24  Ferritin Latest Range: 22-322 ng/mL 167  Hemoglobin Latest Range: 13.0-17.0 g/dL 9.9 (L)  HCT Latest Range: 39.0-52.0 % 29.0 (L)

## 2013-11-29 NOTE — Telephone Encounter (Signed)
Blood counts (hemoglobin is low) due to his sever kidney damage.  His kidneys are not making erythropoetin which helps his body make blood.  The epo shots his kidney doctor is referring to willl help raise his blood counts and may give him more energy.

## 2013-11-29 NOTE — Telephone Encounter (Signed)
LMTRC

## 2013-12-01 ENCOUNTER — Inpatient Hospital Stay (HOSPITAL_COMMUNITY)
Admission: EM | Admit: 2013-12-01 | Discharge: 2013-12-02 | DRG: 292 | Disposition: A | Discharge status: Home / Self Care | Payer: Medicare Other | Attending: Family Medicine | Admitting: Family Medicine

## 2013-12-01 ENCOUNTER — Emergency Department (HOSPITAL_COMMUNITY): Payer: Medicare Other

## 2013-12-01 ENCOUNTER — Encounter (HOSPITAL_COMMUNITY): Payer: Self-pay | Admitting: Cardiology

## 2013-12-01 DIAGNOSIS — E1121 Type 2 diabetes mellitus with diabetic nephropathy: Secondary | ICD-10-CM | POA: Diagnosis present

## 2013-12-01 DIAGNOSIS — D638 Anemia in other chronic diseases classified elsewhere: Secondary | ICD-10-CM | POA: Diagnosis present

## 2013-12-01 DIAGNOSIS — I120 Hypertensive chronic kidney disease with stage 5 chronic kidney disease or end stage renal disease: Secondary | ICD-10-CM | POA: Diagnosis present

## 2013-12-01 DIAGNOSIS — R0602 Shortness of breath: Secondary | ICD-10-CM | POA: Diagnosis present

## 2013-12-01 DIAGNOSIS — E876 Hypokalemia: Secondary | ICD-10-CM | POA: Diagnosis present

## 2013-12-01 DIAGNOSIS — N400 Enlarged prostate without lower urinary tract symptoms: Secondary | ICD-10-CM | POA: Diagnosis present

## 2013-12-01 DIAGNOSIS — N185 Chronic kidney disease, stage 5: Secondary | ICD-10-CM | POA: Diagnosis present

## 2013-12-01 DIAGNOSIS — I447 Left bundle-branch block, unspecified: Secondary | ICD-10-CM | POA: Diagnosis present

## 2013-12-01 DIAGNOSIS — I5033 Acute on chronic diastolic (congestive) heart failure: Secondary | ICD-10-CM | POA: Diagnosis present

## 2013-12-01 DIAGNOSIS — J441 Chronic obstructive pulmonary disease with (acute) exacerbation: Secondary | ICD-10-CM

## 2013-12-01 DIAGNOSIS — I351 Nonrheumatic aortic (valve) insufficiency: Secondary | ICD-10-CM | POA: Diagnosis present

## 2013-12-01 DIAGNOSIS — J449 Chronic obstructive pulmonary disease, unspecified: Secondary | ICD-10-CM | POA: Diagnosis present

## 2013-12-01 DIAGNOSIS — F17210 Nicotine dependence, cigarettes, uncomplicated: Secondary | ICD-10-CM | POA: Diagnosis present

## 2013-12-01 DIAGNOSIS — G4733 Obstructive sleep apnea (adult) (pediatric): Secondary | ICD-10-CM | POA: Diagnosis present

## 2013-12-01 DIAGNOSIS — E11319 Type 2 diabetes mellitus with unspecified diabetic retinopathy without macular edema: Secondary | ICD-10-CM | POA: Diagnosis present

## 2013-12-01 DIAGNOSIS — Z7982 Long term (current) use of aspirin: Secondary | ICD-10-CM | POA: Diagnosis not present

## 2013-12-01 DIAGNOSIS — F17200 Nicotine dependence, unspecified, uncomplicated: Secondary | ICD-10-CM | POA: Diagnosis present

## 2013-12-01 DIAGNOSIS — Z86010 Personal history of colonic polyps: Secondary | ICD-10-CM | POA: Diagnosis not present

## 2013-12-01 DIAGNOSIS — F1010 Alcohol abuse, uncomplicated: Secondary | ICD-10-CM | POA: Diagnosis present

## 2013-12-01 DIAGNOSIS — E785 Hyperlipidemia, unspecified: Secondary | ICD-10-CM | POA: Diagnosis present

## 2013-12-01 DIAGNOSIS — Z720 Tobacco use: Secondary | ICD-10-CM

## 2013-12-01 DIAGNOSIS — Z794 Long term (current) use of insulin: Secondary | ICD-10-CM

## 2013-12-01 DIAGNOSIS — I5020 Unspecified systolic (congestive) heart failure: Secondary | ICD-10-CM

## 2013-12-01 DIAGNOSIS — I10 Essential (primary) hypertension: Secondary | ICD-10-CM

## 2013-12-01 DIAGNOSIS — N184 Chronic kidney disease, stage 4 (severe): Secondary | ICD-10-CM | POA: Diagnosis present

## 2013-12-01 DIAGNOSIS — R079 Chest pain, unspecified: Secondary | ICD-10-CM | POA: Diagnosis present

## 2013-12-01 DIAGNOSIS — I509 Heart failure, unspecified: Secondary | ICD-10-CM | POA: Insufficient documentation

## 2013-12-01 LAB — BASIC METABOLIC PANEL
Anion gap: 16 — ABNORMAL HIGH (ref 5–15)
BUN: 73 mg/dL — AB (ref 6–23)
CHLORIDE: 100 meq/L (ref 96–112)
CO2: 26 mEq/L (ref 19–32)
CREATININE: 4.24 mg/dL — AB (ref 0.50–1.35)
Calcium: 9.9 mg/dL (ref 8.4–10.5)
GFR calc Af Amer: 15 mL/min — ABNORMAL LOW (ref >90)
GFR calc non Af Amer: 13 mL/min — ABNORMAL LOW (ref >90)
GLUCOSE: 103 mg/dL — AB (ref 70–99)
POTASSIUM: 3.5 meq/L — AB (ref 3.7–5.3)
Sodium: 142 mEq/L (ref 137–147)

## 2013-12-01 LAB — HEPATIC FUNCTION PANEL
ALBUMIN: 3.8 g/dL (ref 3.5–5.2)
ALK PHOS: 77 U/L (ref 39–117)
ALT: 11 U/L (ref 0–53)
AST: 12 U/L (ref 0–37)
BILIRUBIN TOTAL: 0.2 mg/dL — AB (ref 0.3–1.2)
Bilirubin, Direct: <0.2 mg/dL (ref 0.0–0.3)
Total Protein: 7 g/dL (ref 6.0–8.3)

## 2013-12-01 LAB — CBC WITH DIFFERENTIAL/PLATELET
Basophils Absolute: 0.1 10*3/uL (ref 0.0–0.1)
Basophils Relative: 1 % (ref 0–1)
Eosinophils Absolute: 0.6 10*3/uL (ref 0.0–0.7)
Eosinophils Relative: 7 % — ABNORMAL HIGH (ref 0–5)
HEMATOCRIT: 32.9 % — AB (ref 39.0–52.0)
HEMOGLOBIN: 11.1 g/dL — AB (ref 13.0–17.0)
LYMPHS ABS: 1 10*3/uL (ref 0.7–4.0)
LYMPHS PCT: 12 % (ref 12–46)
MCH: 30.2 pg (ref 26.0–34.0)
MCHC: 33.7 g/dL (ref 30.0–36.0)
MCV: 89.6 fL (ref 78.0–100.0)
MONO ABS: 1 10*3/uL (ref 0.1–1.0)
MONOS PCT: 11 % (ref 3–12)
NEUTROS ABS: 6 10*3/uL (ref 1.7–7.7)
Neutrophils Relative %: 69 % (ref 43–77)
Platelets: 255 10*3/uL (ref 150–400)
RBC: 3.67 MIL/uL — AB (ref 4.22–5.81)
RDW: 15 % (ref 11.5–15.5)
WBC: 8.6 10*3/uL (ref 4.0–10.5)

## 2013-12-01 LAB — GLUCOSE, CAPILLARY
Glucose-Capillary: 300 mg/dL — ABNORMAL HIGH (ref 70–99)
Glucose-Capillary: 329 mg/dL — ABNORMAL HIGH (ref 70–99)

## 2013-12-01 LAB — PRO B NATRIURETIC PEPTIDE: Pro B Natriuretic peptide (BNP): 37333 pg/mL — ABNORMAL HIGH (ref 0–125)

## 2013-12-01 LAB — TROPONIN I
Troponin I: <0.3 ng/mL (ref <0.30)
Troponin I: <0.3 ng/mL (ref <0.30)

## 2013-12-01 MED ORDER — SODIUM CHLORIDE 0.9 % IV SOLN: 250.0000 mL | INTRAVENOUS | Status: DC | PRN

## 2013-12-01 MED ORDER — BISACODYL 5 MG PO TBEC: 5.0000 mg | DELAYED_RELEASE_TABLET | Freq: Every day | ORAL | Status: DC | PRN

## 2013-12-01 MED ORDER — METHYLPREDNISOLONE SODIUM SUCC 125 MG IJ SOLR
125.0000 mg | Freq: Once | INTRAMUSCULAR | Status: AC
  Administered 2013-12-01: 125 mg via INTRAVENOUS
  Filled 2013-12-01: qty 2

## 2013-12-01 MED ORDER — METOLAZONE 5 MG PO TABS: 2.5000 mg | ORAL_TABLET | Freq: Every day | ORAL | Status: DC | PRN

## 2013-12-01 MED ORDER — METOPROLOL SUCCINATE ER 25 MG PO TB24
25.0000 mg | ORAL_TABLET | Freq: Every day | ORAL | Status: DC
  Administered 2013-12-01 – 2013-12-02 (×2): 25 mg via ORAL
  Filled 2013-12-01 (×2): qty 1

## 2013-12-01 MED ORDER — INSULIN GLARGINE 100 UNIT/ML ~~LOC~~ SOLN
10.0000 [IU] | Freq: Every day | SUBCUTANEOUS | Status: DC
  Administered 2013-12-01: 10 [IU] via SUBCUTANEOUS
  Filled 2013-12-01: qty 0.1

## 2013-12-01 MED ORDER — HYDRALAZINE HCL 25 MG PO TABS
100.0000 mg | ORAL_TABLET | Freq: Two times a day (BID) | ORAL | Status: DC
  Administered 2013-12-01 – 2013-12-02 (×3): 100 mg via ORAL
  Filled 2013-12-01: qty 2
  Filled 2013-12-01 (×3): qty 4
  Filled 2013-12-01 (×2): qty 2

## 2013-12-01 MED ORDER — POTASSIUM CHLORIDE CRYS ER 20 MEQ PO TBCR
40.0000 meq | EXTENDED_RELEASE_TABLET | Freq: Once | ORAL | Status: AC
  Administered 2013-12-01: 40 meq via ORAL
  Filled 2013-12-01: qty 2

## 2013-12-01 MED ORDER — ASPIRIN EC 81 MG PO TBEC
81.0000 mg | DELAYED_RELEASE_TABLET | Freq: Every day | ORAL | Status: DC
  Administered 2013-12-01 – 2013-12-02 (×2): 81 mg via ORAL
  Filled 2013-12-01 (×2): qty 1

## 2013-12-01 MED ORDER — ONDANSETRON HCL 4 MG/2ML IJ SOLN: 4.0000 mg | Freq: Four times a day (QID) | INTRAMUSCULAR | Status: DC | PRN

## 2013-12-01 MED ORDER — IPRATROPIUM-ALBUTEROL 0.5-2.5 (3) MG/3ML IN SOLN
3.0000 mL | Freq: Once | RESPIRATORY_TRACT | Status: AC
  Administered 2013-12-01: 3 mL via RESPIRATORY_TRACT
  Filled 2013-12-01: qty 3

## 2013-12-01 MED ORDER — SODIUM CHLORIDE 0.9 % IJ SOLN: 3.0000 mL | INTRAMUSCULAR | Status: DC | PRN

## 2013-12-01 MED ORDER — FUROSEMIDE 10 MG/ML IJ SOLN
80.0000 mg | Freq: Once | INTRAMUSCULAR | Status: AC
  Administered 2013-12-01: 80 mg via INTRAVENOUS
  Filled 2013-12-01: qty 8

## 2013-12-01 MED ORDER — HEPARIN SODIUM (PORCINE) 5000 UNIT/ML IJ SOLN
5000.0000 [IU] | Freq: Three times a day (TID) | INTRAMUSCULAR | Status: DC
Start: 2013-12-01 — End: 2013-12-02
  Administered 2013-12-01 (×2): 5000 [IU] via SUBCUTANEOUS
  Filled 2013-12-01 (×2): qty 1

## 2013-12-01 MED ORDER — INSULIN ASPART 100 UNIT/ML ~~LOC~~ SOLN
0.0000 [IU] | Freq: Three times a day (TID) | SUBCUTANEOUS | Status: DC
  Administered 2013-12-01: 5 [IU] via SUBCUTANEOUS
  Administered 2013-12-02: 1 [IU] via SUBCUTANEOUS

## 2013-12-01 MED ORDER — ACETAMINOPHEN 325 MG PO TABS: 650.0000 mg | ORAL_TABLET | ORAL | Status: DC | PRN

## 2013-12-01 MED ORDER — SODIUM CHLORIDE 0.9 % IJ SOLN
3.0000 mL | Freq: Two times a day (BID) | INTRAMUSCULAR | Status: DC
  Administered 2013-12-01 (×2): 3 mL via INTRAVENOUS

## 2013-12-01 MED ORDER — ALBUTEROL SULFATE (2.5 MG/3ML) 0.083% IN NEBU
2.5000 mg | INHALATION_SOLUTION | Freq: Once | RESPIRATORY_TRACT | Status: AC
Start: 2013-12-01 — End: 2013-12-01
  Administered 2013-12-01: 2.5 mg via RESPIRATORY_TRACT
  Filled 2013-12-01: qty 3

## 2013-12-01 MED ORDER — IPRATROPIUM-ALBUTEROL 0.5-2.5 (3) MG/3ML IN SOLN
RESPIRATORY_TRACT | Status: AC
  Filled 2013-12-01: qty 3

## 2013-12-01 MED ORDER — ALBUTEROL SULFATE (2.5 MG/3ML) 0.083% IN NEBU
2.5000 mg | INHALATION_SOLUTION | Freq: Once | RESPIRATORY_TRACT | Status: AC
  Administered 2013-12-01: 2.5 mg via RESPIRATORY_TRACT

## 2013-12-01 MED ORDER — INSULIN ASPART 100 UNIT/ML ~~LOC~~ SOLN
5.0000 [IU] | Freq: Once | SUBCUTANEOUS | Status: AC
  Administered 2013-12-01: 5 [IU] via SUBCUTANEOUS

## 2013-12-01 MED ORDER — ALBUTEROL SULFATE (2.5 MG/3ML) 0.083% IN NEBU
INHALATION_SOLUTION | RESPIRATORY_TRACT | Status: AC
  Filled 2013-12-01: qty 3

## 2013-12-01 MED ORDER — FUROSEMIDE 10 MG/ML IJ SOLN
40.0000 mg | Freq: Once | INTRAMUSCULAR | Status: AC
  Administered 2013-12-01: 40 mg via INTRAVENOUS
  Filled 2013-12-01: qty 4

## 2013-12-01 MED ORDER — POTASSIUM CHLORIDE CRYS ER 20 MEQ PO TBCR
20.0000 meq | EXTENDED_RELEASE_TABLET | Freq: Every day | ORAL | Status: DC
  Administered 2013-12-01 – 2013-12-02 (×2): 20 meq via ORAL
  Filled 2013-12-01 (×2): qty 1

## 2013-12-01 MED ORDER — IPRATROPIUM-ALBUTEROL 0.5-2.5 (3) MG/3ML IN SOLN
3.0000 mL | Freq: Once | RESPIRATORY_TRACT | Status: AC
  Administered 2013-12-01: 3 mL via RESPIRATORY_TRACT

## 2013-12-01 MED ORDER — NICOTINE 7 MG/24HR TD PT24
MEDICATED_PATCH | TRANSDERMAL | Status: AC
  Filled 2013-12-01: qty 1

## 2013-12-01 MED ORDER — FEBUXOSTAT 40 MG PO TABS
40.0000 mg | ORAL_TABLET | Freq: Every day | ORAL | Status: DC
  Administered 2013-12-01 – 2013-12-02 (×2): 40 mg via ORAL
  Filled 2013-12-01 (×4): qty 1

## 2013-12-01 MED ORDER — CLONIDINE HCL 0.2 MG PO TABS
0.3000 mg | ORAL_TABLET | Freq: Two times a day (BID) | ORAL | Status: DC
  Administered 2013-12-01 – 2013-12-02 (×3): 0.3 mg via ORAL
  Filled 2013-12-01 (×6): qty 1

## 2013-12-01 MED ORDER — NICOTINE 7 MG/24HR TD PT24
7.0000 mg | MEDICATED_PATCH | Freq: Every day | TRANSDERMAL | Status: DC
  Filled 2013-12-01 (×4): qty 1

## 2013-12-01 NOTE — ED Notes (Signed)
MD at bedside. 

## 2013-12-01 NOTE — H&P (Signed)
History and Physical  [name] XHB:[id number] DOB: 1945-07-14 DOA: 12/01/2013  Referring physician: Dr. Roderic Palau in ED PCP: Odette Fraction, MD  [facility]  Dr. Mercy Moore  Chief Complaint: short of breath  HPI:  68 year old man with history of COPD and chronic diastolic heart failure presented to ED with sudden onset of shortness of breath 1 AM 11/15. Afterwards developed associated chest tightness which lasted approximately one hour and was relieved with bronchodilator emergency department. Initial evaluation suggested acute on chronic diastolic congestive heart failure.  Patient reports he been doing well lately. He has been compliant with diet and medications. He has had no lower extremity edema. He felt well yesterday. This a.m. He developed sudden onset of shortness of breath which awoke him from sleep. No specific aggravating or alleviating factors. He later developed chest pain which has subsequently resolved. He was treated with bronchodilators and Lasix in the emergency department with remarkable improvement. Currently he is feeling a lot better. He describes his chest pain as a pressure-like sensation. No cough at home although he has had some cough here.  He was hospitalized 10/18 for atypical chest pain  In the emergency department treated with bronchodilators, Lasix, Solu-Medrol. Afebrile, vitals notable for isolated systolic hypertension 810-175. He became very short of breath and tachycardic when getting up to use the bedside urinal. Chronic kidney disease at baseline. Troponin negative. BNP 37,000, however much improved compared to previous study 10/17 at which time 62,000. Hemoglobin stable 11.1. Chest x-ray with stable moderate cardiomegaly, diffuse pulmonary vascular congestion, negative for edema. COPD. EKGs independently reviewed showed sinus rhythm with left bundle branch block. There is new T-wave inversion inferiorly, suspect repolarization abnormality  but consider ischemia. Compared to previous studies in October, left bundle branch block is old. T-wave inversion inferiorly is more pronounced. Last echocardiogram June 1025, grade 1 diastolic dysfunction. LVEF 60%.  Review of Systems:  Negative for fever, visual changes, sore throat, rash, new muscle aches, dysuria, bleeding, n/v/abdominal pain.  Past Medical History  Diagnosis Date  . Congestive heart failure 10/2008    preserved LV systolic function   . Aortic insufficiency 09/2008    Normal left ventricular size and function : graded as mild by Doppler , but pulse pressure is widened  . 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 Hypertension   . 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 Erectile dysfunction   . Colonic polyp   . Low serum testosterone level   . Tobacco abuse   . Sleep apnea 2010    Initially unable to afford CPAP  . Benign prostatic hypertrophy   . Obesity, Class I, BMI 30-34.9   . Hyperlipidemia   . Congestive heart failure (CHF)     EF 25-30% 07/2011  . Adenomatous colon polyp 2006  . H. pylori infection 2013    treated with prevpac    Past Surgical History  Procedure Laterality Date  . Retinal laser procedure      diabetic retinopathy  . Lymph node biopsy      surgical exploration of neck-not entirely clear that this represented a lymph node biopsy  . Colonoscopy  08/23/2004    Dr. Vivi Ferns rectum, diminutive polyp of the rectosigmoid removed, inflamed focally adenomatous polyp    Social History:  reports that he has been smoking Cigarettes.  He has been smoking about 0.00 packs per  day for the past 45 years. He uses smokeless tobacco. He reports that he drinks about 2.4 oz of alcohol per week. He reports that he does not use illicit drugs.  Allergies  Allergen Reactions  . Ace Inhibitors Cough    REACTION: cough    Family History  Problem Relation Age of  Onset  . Hypertension Mother   . Cancer Mother   . Cancer Father   . Diabetes Sister      Prior to Admission medications   Medication Sig Start Date End Date Taking? Authorizing Provider  aspirin EC 81 MG tablet Take 81 mg by mouth daily.    Historical Provider, MD  cloNIDine (CATAPRES) 0.3 MG tablet Take 0.3 mg by mouth 2 (two) times daily. 07/12/11 11/02/13  Fayrene Helper, MD  Colchicine 0.6 MG CAPS Take 0.6 mg by mouth daily.    Historical Provider, MD  febuxostat (ULORIC) 40 MG tablet Take 1 tablet (40 mg total) by mouth daily. 04/11/13   Alycia Rossetti, MD  hydrALAZINE (APRESOLINE) 100 MG tablet Take 100 mg by mouth 2 (two) times daily.    Historical Provider, MD  Insulin Glargine (LANTUS SOLOSTAR) 100 UNIT/ML Solostar Pen Inject 10 Units into the skin daily at 10 pm. 07/11/13   Alycia Rossetti, MD  metolazone (ZAROXOLYN) 2.5 MG tablet Take 2.5 mg by mouth daily as needed (for swelling/shortness of breath).    Historical Provider, MD  metoprolol succinate (TOPROL XL) 25 MG 24 hr tablet Take 1 tablet (25 mg total) by mouth daily. 05/30/13   Lendon Colonel, NP  potassium chloride SA (K-DUR,KLOR-CON) 20 MEQ tablet Take 20 mEq by mouth daily.    Historical Provider, MD  torsemide (DEMADEX) 100 MG tablet Take 100 mg by mouth 2 (two) times daily.     Historical Provider, MD   Physical Exam: Filed Vitals:   12/01/13 7654 12/01/13 0843 12/01/13 0854 12/01/13 0900  BP: 220/74   188/69  Pulse: 100   92  Temp:      TempSrc:      Resp: 20   18  Height:      Weight:      SpO2: 100% 96% 100% 100%   General: examined in the emergency department. Appears calm and comfortable Eyes: PERRL, normal lids, irises ENT: grossly normal hearing, lips & tongue Neck: no LAD, masses or thyromegaly Cardiovascular: RRR, no m/r/g. No LE edema. Respiratory: bilateral posterior rales. No wheezes or rhonchi. Normal respiratory effort. Fair air movement. Able to speak in full sentences. Abdomen:  soft, ntnd Skin: no rash or induration seen  Musculoskeletal: grossly normal tone BUE/BLE Psychiatric: grossly normal mood and affect, speech fluent and appropriate Neurologic: grossly non-focal.  Wt Readings from Last 3 Encounters:  12/01/13 87.091 kg (192 lb)  11/26/13 88.451 kg (195 lb)  11/03/13 88.7 kg (195 lb 8.8 oz)    Labs on Admission:  Basic Metabolic Panel:  Recent Labs Lab 12/01/13 0823  NA 142  K 3.5*  CL 100  CO2 26  GLUCOSE 103*  BUN 73*  CREATININE 4.24*  CALCIUM 9.9    Liver Function Tests:  Recent Labs Lab 12/01/13 0823  AST 12  ALT 11  ALKPHOS 77  BILITOT 0.2*  PROT 7.0  ALBUMIN 3.8    CBC:  Recent Labs Lab 11/26/13 0930 12/01/13 0823  WBC  --  8.6  NEUTROABS  --  6.0  HGB 9.9* 11.1*  HCT 29.0* 32.9*  MCV  --  89.6  PLT  --  255    Cardiac Enzymes:  Recent Labs Lab 12/01/13 0823  TROPONINI <0.30    Recent Labs  11/02/13 2047 12/01/13 0823  PROBNP 62727.0* 37333.0*    Radiological Exams on Admission: Dg Chest 2 View  12/01/2013   CLINICAL DATA:  Shortness of breath  EXAM: CHEST  2 VIEW  COMPARISON:  11/02/2013 and 10/10/2013  FINDINGS: Stable moderate cardiomegaly. Atherosclerotic calcification of the thoracic aortic arch. Thoracic aorta is tortuous. Diffuse pulmonary vascular congestion. Lungs are mildly hyperinflated. No definite pulmonary edema. Negative for pleural effusion or focal airspace disease. Chronic mild peribronchial thickening may be related to smoking.  IMPRESSION: Stable moderate cardiomegaly with diffuse pulmonary vascular congestion. Negative for edema.  COPD.   Electronically Signed   By: Curlene Dolphin M.D.   On: 12/01/2013 09:01    Principal Problem:   Acute on chronic diastolic CHF (congestive heart failure), NYHA class 2 Active Problems:   Diabetes mellitus type 2 with retinopathy   TOBACCO ABUSE   Chronic kidney disease, stage 4, severely decreased GFR   Chest pain   HTN  (hypertension)   Assessment/Plan 1. Acute on chronic diastolic congestive heart failure. Weight at baseline, was 10/18 88.7 kg). Suspect more of a "flash" phenomenon. Already remarkably improved. 2. Chest pain. Resolved. Some typical features. Initial evaluation unremarkable. EKG favored to be nonacute with repolarization abnormality rather than evidence of inferior ischemia. 3. Accelerated hypertension. Secondary to acute illness, has not taken medication just today. 4. Isolated systolic HTN (noted 6/86/1683 in office, 170/62), now at baseline. 5. Diabetes mellitus type 2 with retinopathy, nephropathy. Appears to be stable. 6. Chronic kidney disease stage IV-V. Appears to be at baseline. Secondary to diabetic nephropathy 7. Hypokalemia. 8. 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 At baseline. 9. History of alcohol abuse?--reports about 1 drink per month which wife confirms. 10. Tobacco dependence. No history of COPD noted. 11. OSA   Overall much improved. Plan admission to telemetry. Given exam and history, continue to treat for diastolic heart failure with IV diuresis.  Serial troponin. Repeat EKG in the morning.  Cardiology consultation the morning.  Resume antihypertensives.  Sliding scale insulin.  Replete potassium.  Discussed in detail with wife at bedside.  Code Status: full code  DVT prophylaxis: heparin Family Communication:  Disposition Plan/Anticipated LOS: admit, 2-3 days  Time spent: 55 minutes  [name], MD  [facility] Pager [phone] 12/01/2013, 10:26 AM

## 2013-12-01 NOTE — ED Provider Notes (Signed)
CSN: [id number]     Arrival date & time 12/01/13  0720 History  This chart was scribed for Harold Diego, MD by Tula Nakayama, ED Scribe. This patient was seen in room APA01/APA01 and the patient's care was started at 7:56 AM.    Chief Complaint  Patient presents with  . Shortness of Breath   Patient is a 68 y.o. male presenting with shortness of breath. The history is provided by the patient. No language interpreter was used.  Shortness of Breath Severity:  Moderate Onset quality:  Gradual Duration:  12 hours Timing:  Constant Progression:  Unchanged Chronicity:  New Relieved by:  None tried Worsened by:  Nothing tried Ineffective treatments:  None tried Associated symptoms: cough   Associated symptoms: no abdominal pain, no chest pain, no fever, no headaches, no rash and no sputum production     HPI Comments: [name] is a 68 y.o. male with a history of COPD and CHF who presents to the Emergency Department complaining of constant shortness of breath that started last night. He states unproductive cough that started last night as an associated symptom. Pt has history of similar symptoms that occurred in 2010 when he was diagnosed and hospitalized with CHF. Pt denies CP, fever and chills as associated symptoms.   PCP is [employer] is [employer]  Past Medical History  Diagnosis Date  . Congestive heart failure 10/2008    preserved LV systolic function   . Aortic insufficiency 09/2008    Normal left ventricular size and function : graded as mild by Doppler , but pulse pressure is widened  . 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 Hypertension   . 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 Erectile dysfunction   . Colonic polyp   . Low serum testosterone level   . Tobacco abuse   . Sleep apnea 2010    Initially unable to afford CPAP  . Benign prostatic hypertrophy   .  Obesity, Class I, BMI 30-34.9   . Hyperlipidemia   . Congestive heart failure (CHF)     EF 25-30% 07/2011  . Adenomatous colon polyp 2006  . H. pylori infection 2013    treated with prevpac   Past Surgical History  Procedure Laterality Date  . Retinal laser procedure      diabetic retinopathy  . Lymph node biopsy      surgical exploration of neck-not entirely clear that this represented a lymph node biopsy  . Colonoscopy  08/23/2004    Dr. Vivi Ferns rectum, diminutive polyp of the rectosigmoid removed, inflamed focally adenomatous polyp   Family History  Problem Relation Age of Onset  . Hypertension Mother   . Cancer Mother   . Cancer Father   . Diabetes Sister    History  Substance Use Topics  . Smoking status: Current Some Day Smoker -- 45 years    Types: Cigarettes  . Smokeless tobacco: Current User     Comment: 1-2 cigarettes OF THE NEW ELECTRIC CIGARETTES  . Alcohol Use: 2.4 oz/week    2 Glasses of wine, 2 Shots of liquor per week     Comment: occ    Review of Systems  Constitutional: Negative for fever, appetite change and fatigue.  HENT: Negative for congestion, ear discharge and sinus pressure.   Eyes: Negative for discharge.  Respiratory: Positive for cough  and shortness of breath. Negative for sputum production.   Cardiovascular: Negative for chest pain.  Gastrointestinal: Negative for abdominal pain and diarrhea.  Genitourinary: Negative for frequency and hematuria.  Musculoskeletal: Negative for back pain.  Skin: Negative for rash.  Neurological: Negative for seizures and headaches.  Psychiatric/Behavioral: Negative for hallucinations.    Allergies  Ace inhibitors  Home Medications   Prior to Admission medications   Medication Sig Start Date End Date Taking? Authorizing Provider  aspirin EC 81 MG tablet Take 81 mg by mouth daily.    Historical Provider, MD  cloNIDine (CATAPRES) 0.3 MG tablet Take 0.3 mg by mouth 2 (two) times daily. 07/12/11  11/02/13  Fayrene Helper, MD  Colchicine 0.6 MG CAPS Take 0.6 mg by mouth daily.    Historical Provider, MD  febuxostat (ULORIC) 40 MG tablet Take 1 tablet (40 mg total) by mouth daily. 04/11/13   Alycia Rossetti, MD  hydrALAZINE (APRESOLINE) 100 MG tablet Take 100 mg by mouth 2 (two) times daily.    Historical Provider, MD  Insulin Glargine (LANTUS SOLOSTAR) 100 UNIT/ML Solostar Pen Inject 10 Units into the skin daily at 10 pm. 07/11/13   Alycia Rossetti, MD  metolazone (ZAROXOLYN) 2.5 MG tablet Take 2.5 mg by mouth daily as needed (for swelling/shortness of breath).    Historical Provider, MD  metoprolol succinate (TOPROL XL) 25 MG 24 hr tablet Take 1 tablet (25 mg total) by mouth daily. 05/30/13   Lendon Colonel, NP  potassium chloride SA (K-DUR,KLOR-CON) 20 MEQ tablet Take 20 mEq by mouth daily.    Historical Provider, MD  torsemide (DEMADEX) 100 MG tablet Take 100 mg by mouth 2 (two) times daily.     Historical Provider, MD   BP 204/67 mmHg  Pulse 84  Temp(Src) 98 F (36.7 C) (Oral)  Resp 18  Ht 5\' 10"  (1.778 m)  Wt 192 lb (87.091 kg)  BMI 27.55 kg/m2  SpO2 96% Physical Exam  Constitutional: He is oriented to person, place, and time. He appears well-developed.  HENT:  Head: Normocephalic.  Eyes: Conjunctivae and EOM are normal. No scleral icterus.  Neck: Neck supple. No thyromegaly present.  Cardiovascular: Normal rate and regular rhythm.  Exam reveals no gallop and no friction rub.   No murmur heard. Pulmonary/Chest: No stridor. He has wheezes (Mild, bilaterally). He has no rales. He exhibits no tenderness.  Mild wheezing bilaterally  Abdominal: He exhibits no distension. There is no tenderness. There is no rebound.  Musculoskeletal: Normal range of motion. He exhibits no edema.  Lymphadenopathy:    He has no cervical adenopathy.  Neurological: He is oriented to person, place, and time. He exhibits normal muscle tone. Coordination normal.  Skin: No rash noted. No  erythema.  Psychiatric: He has a normal mood and affect. His behavior is normal.  Nursing note and vitals reviewed.   ED Course  Procedures (including critical care time) DIAGNOSTIC STUDIES: Oxygen Saturation is 96% on RA, adequate by my interpretation.    COORDINATION OF CARE: 8:01 AM Discussed treatment plan with pt which includes nebulizer treatment, lab work, EKG and  chest x-ray and pt agreed to plan.  Labs Review Labs Reviewed  CBC WITH DIFFERENTIAL  BASIC METABOLIC PANEL  TROPONIN I    Imaging Review No results found.   EKG Interpretation   Date/Time:  Sunday December 01 2013 07:38:13 EST Ventricular Rate:  82 PR Interval:  170 QRS Duration: 122 QT Interval:  413 QTC Calculation: 482 R  Axis:   29 Text Interpretation:  Sinus rhythm Probable left atrial enlargement Left  bundle branch block Confirmed by Demontez Novack  MD, Rosalena Mccorry [id number]) on 12/01/2013  10:46:36 AM      MDM   Final diagnoses:  SOB (shortness of breath)    The chart was scribed for me under my direct supervision.  I personally performed the history, physical, and medical decision making and all procedures in the evaluation of this patient.Harold Diego, MD 12/01/13 [phone]

## 2013-12-01 NOTE — Plan of Care (Signed)
Problem: Phase I Progression Outcomes Goal: Dyspnea controlled at rest (HF) Outcome: Completed/Met Date Met:  12/01/13

## 2013-12-01 NOTE — ED Notes (Signed)
Pt requested to use the urinal.  Assisted pt to side of bed and pt became diaphoretic, HR increased to 30's-140s and pt very SOB.  Breathing treatment in progress and 02 sat stayed around 97%.  Pt able to void while sitting on bedside and then helped pt get back comfortable on the stretcher.  HR slowed and pt became calmer.  Pt also c/o chest pain.  EKG repeated and EDP aware.

## 2013-12-01 NOTE — ED Notes (Signed)
Pt c/o chest tightness .  Rates a 6/10.  States gets worse with coughing and deep breathing.

## 2013-12-01 NOTE — Progress Notes (Signed)
Took a cpap to patient's room to setup, patient and his wife explained he does not wear a CPAP at home and he does not need to wear one here.

## 2013-12-01 NOTE — Plan of Care (Signed)
Problem: Phase I Progression Outcomes Goal: Voiding-avoid urinary catheter unless indicated Outcome: Completed/Met Date Met:  12/01/13

## 2013-12-01 NOTE — ED Notes (Signed)
Sob and non productive cough since last night.  Denies any pain.  States it feels like his CHF.

## 2013-12-02 DIAGNOSIS — N184 Chronic kidney disease, stage 4 (severe): Secondary | ICD-10-CM

## 2013-12-02 LAB — TROPONIN I

## 2013-12-02 LAB — BASIC METABOLIC PANEL
Anion gap: 13 (ref 5–15)
BUN: 79 mg/dL — AB (ref 6–23)
CO2: 26 mEq/L (ref 19–32)
CREATININE: 4.43 mg/dL — AB (ref 0.50–1.35)
Calcium: 9.6 mg/dL (ref 8.4–10.5)
Chloride: 95 mEq/L — ABNORMAL LOW (ref 96–112)
GFR calc Af Amer: 14 mL/min — ABNORMAL LOW (ref >90)
GFR, EST NON AFRICAN AMERICAN: 12 mL/min — AB (ref >90)
Glucose, Bld: 126 mg/dL — ABNORMAL HIGH (ref 70–99)
Potassium: 3.7 mEq/L (ref 3.7–5.3)
Sodium: 134 mEq/L — ABNORMAL LOW (ref 137–147)

## 2013-12-02 LAB — GLUCOSE, CAPILLARY
Glucose-Capillary: 123 mg/dL — ABNORMAL HIGH (ref 70–99)
Glucose-Capillary: 199 mg/dL — ABNORMAL HIGH (ref 70–99)

## 2013-12-02 NOTE — Care Management Note (Signed)
    Page 1 of 1   12/02/2013     10:53:00 AM CARE MANAGEMENT NOTE 12/02/2013  Patient:  [name], [name]   Account Number:  [id number]  Date Initiated:  12/02/2013  Documentation initiated by:  Jolene Provost  Subjective/Objective Assessment:   Pt is from home, lives with wife and is independent with ADL's. Pt has PT job as Administrator. Pt has no medication needs or HH serivces prior to admission.     Action/Plan:   Pt plans to discharge home with self care today. No CM needs identified at this time.   Anticipated DC Date:  12/02/2013   Anticipated DC Plan:  [place]  CM consult      Choice offered to / List presented to:             Status of service:  Completed, signed off Medicare Important Message given?   (If response is "NO", the following Medicare IM given date fields will be blank) Date Medicare IM given:   Medicare IM given by:   Date Additional Medicare IM given:   Additional Medicare IM given by:    Discharge Disposition:  HOME/SELF CARE  Per UR Regulation:    If discussed at Long Length of Stay Meetings, dates discussed:    Comments:  12/02/2013 [place], RN, MSN, [facility]

## 2013-12-02 NOTE — Care Management Utilization Note (Signed)
UR review complete.  

## 2013-12-02 NOTE — Progress Notes (Signed)
Patient discharged with instructions given on medications,and follow up visits,patient,and family verbalized understanding. No c/o pain or discomfort noted.Accompanied by staff to an awaiting vehicle.

## 2013-12-02 NOTE — Plan of Care (Signed)
Problem: Discharge Progression Outcomes Goal: Barriers To Progression Addressed/Resolved Outcome: Completed/Met Date Met:  12/02/13 Goal: Able to perform self care activities Outcome: Completed/Met Date Met:  12/02/13 Goal: Discharge plan in place and appropriate Outcome: Completed/Met Date Met:  12/02/13 Goal: Pain controlled with appropriate interventions Outcome: Completed/Met Date Met:  12/02/13 Goal: Hemodynamically stable Outcome: Completed/Met Date Met:  56/81/27 Goal: Complications resolved/controlled Outcome: Completed/Met Date Met:  12/02/13 Goal: Tolerating diet Outcome: Completed/Met Date Met:  12/02/13 Goal: Activity appropriate for discharge plan Outcome: Completed/Met Date Met:  12/02/13 Goal: Other Discharge Outcomes/Goals Outcome: Completed/Met Date Met:  12/02/13

## 2013-12-02 NOTE — Plan of Care (Signed)
Problem: Phase I Progression Outcomes Goal: Pain controlled with appropriate interventions Outcome: Completed/Met Date Met:  12/02/13     

## 2013-12-02 NOTE — Progress Notes (Signed)
PROGRESS NOTE  [name] ZSW:[id number] DOB: [birth year] DOA: 12/01/2013 PCP: Odette Fraction, MD [facility]  Dr. Mercy Moore  Late entry  Summary: 68 year old man with history of COPD and chronic diastolic heart failure presented to ED with sudden onset of shortness of breath 1 AM 11/15. Afterwards developed associated chest tightness which lasted approximately one hour and was relieved with bronchodilator emergency department. Initial evaluation suggested acute on chronic diastolic congestive heart failure.  Assessment/Plan: 1. Acute on chronic diastolic congestive heart failure. Diuresis adequate. Weight down 1 pound. Acute issue appears resolved. 2. Chest pain with some typical features. troponins negative, no recurrent pain. Likely secondary to acute heart failure. 3. Accelerated hypertension. Secondary to missed medications, acute illness. Resolved. 4. Isolated systolic hypertension. Much improved. 5. 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 Remains stable. 6. 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 Followed by nephrology as an outpatient. Remains stable. 7. 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 8. Tobacco dependence. No history of COPD noted.   Overall much improved. Plan discharge home today with outpatient follow-up with cardiology which has been arranged.  Discussed in detail with wife at bedside.  [name], MD  [facility]  Pager [phone] If 7PM-7AM, please contact night-coverage at [email or web address], password [email or web address] 12/02/2013, 7:55 AM  LOS: 1 day   Consultants:  Cardiology   Procedures:    Antibiotics:    HPI/Subjective: No issues charted overnight.  Feels well this morning. No chest pain, no shortness of breath. Wants to go home.  Objective: Filed Vitals:   12/01/13 1514 12/01/13 2103 12/02/13 0405 12/02/13 0646  BP: 159/51 152/50 155/46   Pulse: 85 78 64     Temp: 98.6 F (37 C) 98.8 F (37.1 C) 98.1 F (36.7 C)   TempSrc: Oral Oral Oral   Resp: 18 20 14    Height:      Weight:    86.818 kg (191 lb 6.4 oz)  SpO2: 100% 100% 100%     Intake/Output Summary (Last 24 hours) at 12/02/13 0755 Last data filed at 12/01/13 1806  Gross per 24 hour  Intake    243 ml  Output    850 ml  Net   -607 ml     Filed Weights   12/01/13 1243 12/01/13 1303 12/02/13 0646  Weight: 87.091 kg (192 lb) 87.091 kg (192 lb) 86.818 kg (191 lb 6.4 oz)    Exam:     Afebrile, vital signs are stable. No hypoxia.  General: appears calm, comfortable. Sitting on the side of the bed. Appears well.  Psych: alert. Speech fluent and appropriate.  CV: regular rate and rhythm. No murmur, rub or gallop. No lower extremity edema.  Respiratory: clear to auscultation bilaterally. No wheezes, rales or rhonchi. Normal respiratory effort  Data Reviewed:  Weight down 1 pound. Urine output 850.  Capillary blood sugars are stable.  Chronic kidney disease appears to be stable. Potassium normal.  Troponins negative.  EKG sinus rhythm, LVH with repolarization abnormality, left bundle branch block  Scheduled Meds: . aspirin EC  81 mg Oral Daily  . cloNIDine  0.3 mg Oral BID  . febuxostat  40 mg Oral Daily  . heparin  5,000 Units Subcutaneous 3 times per day  . hydrALAZINE  100 mg Oral BID  . insulin aspart  0-9 Units Subcutaneous TID WC  . insulin glargine  10 Units Subcutaneous Q2200  . metoprolol succinate  25 mg Oral Daily  . nicotine  7  mg Transdermal Daily  . potassium chloride SA  20 mEq Oral Daily  . sodium chloride  3 mL Intravenous Q12H   Continuous Infusions:   Principal Problem:   Acute on chronic diastolic CHF (congestive heart failure), NYHA class 2 Active Problems:   Diabetes mellitus type 2 with retinopathy   TOBACCO ABUSE   Chronic kidney disease, stage 4, severely decreased GFR   Chest pain   HTN (hypertension)

## 2013-12-02 NOTE — Plan of Care (Signed)
Problem: Phase I Progression Outcomes Goal: Up in chair, BRP Outcome: Completed/Met Date Met:  12/02/13

## 2013-12-02 NOTE — Discharge Summary (Signed)
Physician Discharge Summary  [name] EHM:[id number] DOB: [birth year] DOA: 12/01/2013  PCP: Odette Fraction, MD  Admit date: 12/01/2013 Discharge date: 12/02/2013  Recommendations for Outpatient Follow-up:  1. Acute on chronic diastolic congestive heart failure. Outpatient cardiology follow-up has been arranged.    Follow-up Information    Follow up with Odette Fraction, MD.   Specialty:  [facility] Medicine   Contact information:   [address] [place] [address] [phone]       Follow up with Jory Sims, NP On 12/16/2013.   Specialty:  Nurse Practitioner   Why:  2:30 PM   Contact information:   [address] [place] [address] [phone]      Discharge Diagnoses:  1. Acute on chronic diastolic congestive heart failure 2. Chest pain, resolved 3. Accelerated hypertension 4. Isolated systolic hypertension 5. 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 Chronic kidney disease stage IV-V, stable.  7. Anemia of chronic disease.  Discharge Condition: improved Disposition: home  Diet recommendation: heart healthy diabetic diet  Filed Weights   12/01/13 1243 12/01/13 1303 12/02/13 0646  Weight: 87.091 kg (192 lb) 87.091 kg (192 lb) 86.818 kg (191 lb 6.4 oz)    History of present illness:  68 year old man with history of COPD and chronic diastolic heart failure presented to ED with sudden onset of shortness of breath 1 AM 11/15. Afterwards developed associated chest tightness which lasted approximately one hour and was relieved with bronchodilator emergency department. Initial evaluation suggested acute on chronic diastolic congestive heart failure.  Hospital Course:  Mr. Doolan was admitted overnight, had no recurrence of chest pain or shortness of breath. The following morning he was asymptomatic and requested discharge home. Hospitalization was uncomplicated with treatment of acute on chronic diastolic heart failure and  chest pain related to this. Individual issues as below.   Acute on chronic diastolic congestive heart failure. Diuresis adequate. Weight down 1 pound. Acute issue appears resolved.  Chest pain with some typical features. Troponins negative, no recurrent pain. Likely secondary to acute heart failure. EKGs on admission similar to previous with left bundle branch block and intermittent inferior T-wave inversion, most recently seen October 2015. In the past LVH with repolarization abnormality is seen.  Accelerated hypertension. Secondary to missed medications, acute illness. Resolved.  Isolated systolic hypertension. Much improved.  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 Remains stable.  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 Followed by nephrology as an outpatient. Remains stable.  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  Tobacco dependence. No history of COPD noted.  Consultants:  none  Procedures: none  Discharge Instructions  Discharge Instructions    (HEART FAILURE PATIENTS) Call MD:  Anytime you have any of the following symptoms: 1) 3 pound weight gain in 24 hours or 5 pounds in 1 week 2) shortness of breath, with or without a dry hacking cough 3) swelling in the hands, feet or stomach 4) if you have to sleep on extra pillows at night in order to breathe.    Complete by:  As directed      Activity as tolerated - No restrictions    Complete by:  As directed      Diet - low sodium heart healthy    Complete by:  As directed      Diet Carb Modified    Complete by:  As directed      Discharge instructions  Complete by:  As directed   Call your physician or seek immediate medical attention for chest pain, shortness of breath, swelling or worsening of condition.          Discharge Medication List as of 12/02/2013  9:53 AM    CONTINUE these medications which have NOT CHANGED   Details  aspirin EC 81 MG tablet  Take 81 mg by mouth daily., Until Discontinued, Historical Med    cloNIDine (CATAPRES) 0.3 MG tablet Take 1 tablet by mouth 2 (two) times daily., Starting 11/28/2013, Until Discontinued, Historical Med    Colchicine 0.6 MG CAPS Take 0.6 mg by mouth daily., Until Discontinued, Historical Med    febuxostat (ULORIC) 40 MG tablet Take 1 tablet (40 mg total) by mouth daily., Starting 04/11/2013, Until Discontinued, Normal    hydrALAZINE (APRESOLINE) 100 MG tablet Take 100 mg by mouth 2 (two) times daily., Until Discontinued, Historical Med    Insulin Glargine (LANTUS SOLOSTAR) 100 UNIT/ML Solostar Pen Inject 10 Units into the skin daily at 10 pm., Starting 07/11/2013, Until Discontinued, Normal    metolazone (ZAROXOLYN) 2.5 MG tablet Take 2.5 mg by mouth daily as needed (for swelling/shortness of breath)., Until Discontinued, Historical Med    metoprolol succinate (TOPROL XL) 25 MG 24 hr tablet Take 1 tablet (25 mg total) by mouth daily., Starting 05/30/2013, Until Discontinued, Normal    potassium chloride SA (K-DUR,KLOR-CON) 20 MEQ tablet Take 20 mEq by mouth daily., Until Discontinued, Historical Med    torsemide (DEMADEX) 100 MG tablet Take 100 mg by mouth 2 (two) times daily. , Until Discontinued, Historical Med       Allergies  Allergen Reactions  . Ace Inhibitors Cough    REACTION: cough    The results of significant diagnostics from this hospitalization (including imaging, microbiology, ancillary and laboratory) are listed below for reference.    Significant Diagnostic Studies: Dg Chest 2 View  12/01/2013   CLINICAL DATA:  Shortness of breath  EXAM: CHEST  2 VIEW  COMPARISON:  11/02/2013 and 10/10/2013  FINDINGS: Stable moderate cardiomegaly. Atherosclerotic calcification of the thoracic aortic arch. Thoracic aorta is tortuous. Diffuse pulmonary vascular congestion. Lungs are mildly hyperinflated. No definite pulmonary edema. Negative for pleural effusion or focal airspace disease.  Chronic mild peribronchial thickening may be related to smoking.  IMPRESSION: Stable moderate cardiomegaly with diffuse pulmonary vascular congestion. Negative for edema.  COPD.   Electronically Signed   By: Curlene Dolphin M.D.   On: 12/01/2013 09:01   Dg Chest Port 1 View  11/02/2013   CLINICAL DATA:  Chest pain for 2 days.  Initial encounter  EXAM: PORTABLE CHEST - 1 VIEW  COMPARISON:  10/10/2013  FINDINGS: There is moderate cardiomegaly. Apparent increase in size may be related to differences in technique. Chronic mild tortuosity of the aorta. Suggestion of cephalized blood flow but no pulmonary edema. No pneumonia, effusion, or pneumothorax.  IMPRESSION: Cardiomegaly without failure.   Electronically Signed   By: Jorje Guild M.D.   On: 11/02/2013 21:29     Labs: Basic Metabolic Panel:  Recent Labs Lab 12/01/13 0823 12/02/13 0328  NA 142 134*  K 3.5* 3.7  CL 100 95*  CO2 26 26  GLUCOSE 103* 126*  BUN 73* 79*  CREATININE 4.24* 4.43*  CALCIUM 9.9 9.6   Liver Function Tests:  Recent Labs Lab 12/01/13 0823  AST 12  ALT 11  ALKPHOS 77  BILITOT 0.2*  PROT 7.0  ALBUMIN 3.8   CBC:  Recent Labs  Lab 11/26/13 0930 12/01/13 0823  WBC  --  8.6  NEUTROABS  --  6.0  HGB 9.9* 11.1*  HCT 29.0* 32.9*  MCV  --  89.6  PLT  --  255   Cardiac Enzymes:  Recent Labs Lab 12/01/13 0823 12/01/13 1506 12/01/13 2043 12/02/13 0328  TROPONINI <0.30 <0.30 <0.30 <0.30     Recent Labs  11/02/13 2047 12/01/13 0823  PROBNP 62727.0* 37333.0*   CBG:  Recent Labs Lab 12/01/13 1631 12/01/13 2102 12/02/13 0021 12/02/13 0724  GLUCAP 300* 329* 199* 123*    Principal Problem:   Acute on chronic diastolic CHF (congestive heart failure), NYHA class 2 Active Problems:   Diabetes mellitus type 2 with retinopathy   TOBACCO ABUSE   Chronic kidney disease, stage 4, severely decreased GFR   Chest pain   HTN (hypertension)   Time coordinating discharge: 35  minutes  Signed:  [name], MD [facility] 12/02/2013, 6:00 PM

## 2013-12-03 NOTE — Telephone Encounter (Signed)
LMTRC

## 2013-12-03 NOTE — Telephone Encounter (Signed)
Spoke to pt's wife and is aware of below and informed her if he had any further questions he is more then welcome to come in and discuss with Dr. Dennard Schaumann.

## 2013-12-05 ENCOUNTER — Telehealth: Payer: Self-pay | Admitting: Family Medicine

## 2013-12-05 NOTE — Telephone Encounter (Signed)
Patient would like to speak to you regarding his lantis, he says it is not enough  [phone]

## 2013-12-05 NOTE — Telephone Encounter (Signed)
lmtrc

## 2013-12-16 ENCOUNTER — Encounter: Payer: Medicare Other | Admitting: Adult Health

## 2013-12-17 ENCOUNTER — Encounter (HOSPITAL_COMMUNITY)
Admission: RE | Admit: 2013-12-17 | Discharge: 2013-12-17 | Disposition: A | Discharge status: Home / Self Care | Payer: Medicare Other | Source: Ambulatory Visit | Attending: Nephrology | Admitting: Nephrology

## 2013-12-17 DIAGNOSIS — N184 Chronic kidney disease, stage 4 (severe): Secondary | ICD-10-CM | POA: Diagnosis not present

## 2013-12-17 DIAGNOSIS — D638 Anemia in other chronic diseases classified elsewhere: Secondary | ICD-10-CM | POA: Diagnosis not present

## 2013-12-17 LAB — HEMOGLOBIN AND HEMATOCRIT, BLOOD
HEMATOCRIT: 33.6 % — AB (ref 39.0–52.0)
HEMOGLOBIN: 11.7 g/dL — AB (ref 13.0–17.0)

## 2013-12-17 MED ORDER — DARBEPOETIN ALFA 100 MCG/0.5ML IJ SOSY
PREFILLED_SYRINGE | INTRAMUSCULAR | Status: AC
  Filled 2013-12-17: qty 0.5

## 2013-12-17 MED ORDER — DARBEPOETIN ALFA 100 MCG/0.5ML IJ SOSY
100.0000 ug | PREFILLED_SYRINGE | INTRAMUSCULAR | Status: DC
Start: 2013-12-17 — End: 2013-12-18
  Administered 2013-12-17: 100 ug via SUBCUTANEOUS

## 2013-12-17 NOTE — Progress Notes (Signed)
Results for MALON, BRANTON (MRN [id number]) as of 12/17/2013 09:14  Ref. Range 12/17/2013 08:52  Hemoglobin Latest Range: 13.0-17.0 g/dL 11.7 (L)  HCT Latest Range: 39.0-52.0 % 33.6 (L)  Labs drawn prior to injection. Aransep inj given due to Hgb <12

## 2014-01-07 ENCOUNTER — Encounter (HOSPITAL_COMMUNITY)
Admission: RE | Admit: 2014-01-07 | Discharge: 2014-01-07 | Disposition: A | Discharge status: Home / Self Care | Payer: Medicare Other | Source: Ambulatory Visit | Attending: Nephrology | Admitting: Nephrology

## 2014-01-07 DIAGNOSIS — D638 Anemia in other chronic diseases classified elsewhere: Secondary | ICD-10-CM | POA: Diagnosis not present

## 2014-01-07 LAB — HEMOGLOBIN AND HEMATOCRIT, BLOOD
HCT: 36.4 % — ABNORMAL LOW (ref 39.0–52.0)
Hemoglobin: 12 g/dL — ABNORMAL LOW (ref 13.0–17.0)

## 2014-01-07 MED ORDER — DARBEPOETIN ALFA 100 MCG/0.5ML IJ SOSY
100.0000 ug | PREFILLED_SYRINGE | Freq: Once | INTRAMUSCULAR | Status: DC
Start: 2014-01-07 — End: 2014-01-07

## 2014-01-07 NOTE — Progress Notes (Signed)
Results for [name], [name] (MRN [id number]) as of 01/07/2014 11:13  Ref. Range 01/07/2014 08:45  Hemoglobin Latest Range: 13.0-17.0 g/dL 12.0 (L)  HCT Latest Range: 39.0-52.0 % 36.4 (L)  Aranesp held this visit per orders, will return in 2 weeks for H/H

## 2014-01-15 ENCOUNTER — Encounter: Payer: Self-pay | Admitting: Physician Assistant

## 2014-01-15 ENCOUNTER — Ambulatory Visit (INDEPENDENT_AMBULATORY_CARE_PROVIDER_SITE_OTHER): Payer: Medicare Other | Admitting: Physician Assistant

## 2014-01-15 VITALS — BP 150/60 | HR 60 | Temp 97.7°F | Resp 18 | Wt 198.0 lb

## 2014-01-15 DIAGNOSIS — M25662 Stiffness of left knee, not elsewhere classified: Secondary | ICD-10-CM

## 2014-01-15 DIAGNOSIS — M25562 Pain in left knee: Secondary | ICD-10-CM

## 2014-01-15 NOTE — Progress Notes (Signed)
Patient ID: [name] MRN: [id number], DOB: [birth year], 68 y.o. Date of Encounter: 01/15/2014, 3:07 PM    Chief Complaint:  Chief Complaint  Patient presents with  . left knee    swollen,painful,stiff comes/goes     HPI: 68 y.o. year old AA male reports that he has been having problems with his left knee off and on over the past few months.  Says that the first time he had any significant episode of problem with the left knee was about 2 or 3 months ago.  Says at that time he had pain stiffness and swelling which lasted about 3 days. Says that he took some prednisone and " that calmed it down." Says since then he's had some mild discomfort and problems that would Come and go. Says that now he is not having any pain in the knee but stiffness and swelling. Says that the swelling will decrease and then will swell again. Says that at times the left knee will give way and also at times it will lock up.  He says that he did no specific activity prior to the onset of these episodes of increased symptoms.  Has had no trauma or injury. 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     Home Meds:   Outpatient Prescriptions Prior to Visit  Medication Sig Dispense Refill  . aspirin EC 81 MG tablet Take 81 mg by mouth daily.    . cloNIDine (CATAPRES) 0.3 MG tablet Take 1 tablet by mouth 2 (two) times daily.    . Colchicine 0.6 MG CAPS Take 0.6 mg by mouth daily.    . febuxostat (ULORIC) 40 MG tablet Take 1 tablet (40 mg total) by mouth daily. 30 tablet 2  . hydrALAZINE (APRESOLINE) 100 MG tablet Take 100 mg by mouth 2 (two) times daily.    . Insulin Glargine (LANTUS SOLOSTAR) 100 UNIT/ML Solostar Pen Inject 10 Units into the skin daily at 10 pm. 5 pen PRN  . metolazone (ZAROXOLYN) 2.5 MG tablet Take 2.5 mg by mouth daily as needed (for swelling/shortness of breath).    . metoprolol succinate (TOPROL XL) 25 MG 24 hr tablet Take 1  tablet (25 mg total) by mouth daily. 30 tablet 6  . potassium chloride SA (K-DUR,KLOR-CON) 20 MEQ tablet Take 20 mEq by mouth daily.    Marland Kitchen torsemide (DEMADEX) 100 MG tablet Take 100 mg by mouth 2 (two) times daily.      No facility-administered medications prior to visit.    Allergies:  Allergies  Allergen Reactions  . Ace Inhibitors Cough    REACTION: cough      Review of Systems: See HPI for pertinent ROS. All other ROS negative.    Physical Exam: Blood pressure 150/60, pulse 60, temperature 97.7 F (36.5 C), temperature source Oral, resp. rate 18, weight 198 lb (89.812 kg)., Body mass index is 28.41 kg/(m^2). General: WNWD AAM.  Appears in no acute distress. Neck: Supple. No thyromegaly. No lymphadenopathy. Lungs: Clear bilaterally to auscultation without wheezes, rales, or rhonchi. Breathing is unlabored. Heart: Regular rhythm. No murmurs, rubs, or gallops. Msk:  Strength and tone normal for age. Left Knee: Inspection: Mild swelling just superior to patella/knee. O/w inspection normal.  Palpation: There is mild tenderness with palpation of the medial joint line. No other area of tenderness.  No tenderness over the anterior aspect of the patella and no tenderness with  palpation of the lateral joint line. Anterior drawer is normal. Grind test is mildly positive. Extremities/Skin: Warm and dry.  Neuro: Alert and oriented X 3. Moves all extremities spontaneously. Gait is normal. CNII-XII grossly in tact. Psych:  Responds to questions appropriately with a normal affect.     ASSESSMENT AND PLAN:  68 y.o. year old male with  1. Knee stiffness, left - Ambulatory referral to Orthopedic Surgery  2. Left knee pain - Ambulatory referral to Orthopedic Surgery  He had x-ray of the left knee 09/06/2012. This showed Mild medial tibiofemoral joint space narrowing. Moderate sized joint effusion.  Given the findings on this prior x-ray and his current symptoms I think he is going to  need treatment by an orthopedist. Therefore I will go ahead and refer him there and let them do any further imaging necessary. Discussed this plan with the patient and he is agreeable to proceed with this as above. Says that he is not having any pain at present and needs no medication to use in the interim while he awaits appointment with Ortho.   Marin Olp Kimberly, [place], [facility] 01/15/2014 3:07 PM

## 2014-01-21 ENCOUNTER — Encounter (HOSPITAL_COMMUNITY)
Admission: RE | Admit: 2014-01-21 | Discharge: 2014-01-21 | Disposition: A | Discharge status: Home / Self Care | Payer: Medicare Other | Source: Ambulatory Visit | Attending: Nephrology | Admitting: Nephrology

## 2014-01-21 ENCOUNTER — Other Ambulatory Visit (HOSPITAL_COMMUNITY): Payer: Medicare Other

## 2014-01-21 ENCOUNTER — Ambulatory Visit (HOSPITAL_COMMUNITY): Payer: Medicare Other

## 2014-01-21 DIAGNOSIS — N184 Chronic kidney disease, stage 4 (severe): Secondary | ICD-10-CM | POA: Insufficient documentation

## 2014-01-21 DIAGNOSIS — D638 Anemia in other chronic diseases classified elsewhere: Secondary | ICD-10-CM | POA: Insufficient documentation

## 2014-01-21 LAB — HEMOGLOBIN AND HEMATOCRIT, BLOOD
HCT: 34 % — ABNORMAL LOW (ref 39.0–52.0)
Hemoglobin: 11.8 g/dL — ABNORMAL LOW (ref 13.0–17.0)

## 2014-01-21 MED ORDER — DARBEPOETIN ALFA 100 MCG/0.5ML IJ SOSY
100.0000 ug | PREFILLED_SYRINGE | Freq: Once | INTRAMUSCULAR | Status: AC
  Administered 2014-01-21: 100 ug via SUBCUTANEOUS
  Filled 2014-01-21: qty 0.5

## 2014-01-21 NOTE — Progress Notes (Signed)
Results for [name], [name] (MRN [id number]) as of 01/21/2014 11:24  Ref. Range 01/21/2014 08:00  Hemoglobin Latest Range: 13.0-17.0 g/dL 11.8 (L)  HCT Latest Range: 39.0-52.0 % 34.0 (L)

## 2014-01-22 ENCOUNTER — Encounter: Payer: Self-pay | Admitting: Cardiovascular Disease

## 2014-01-22 ENCOUNTER — Emergency Department (HOSPITAL_COMMUNITY): Payer: Medicare Other

## 2014-01-22 ENCOUNTER — Encounter (HOSPITAL_COMMUNITY): Payer: Self-pay

## 2014-01-22 ENCOUNTER — Emergency Department (HOSPITAL_COMMUNITY)
Admission: EM | Admit: 2014-01-22 | Discharge: 2014-01-23 | Disposition: A | Discharge status: Home / Self Care | Payer: Medicare Other | Attending: Emergency Medicine | Admitting: Emergency Medicine

## 2014-01-22 ENCOUNTER — Ambulatory Visit (INDEPENDENT_AMBULATORY_CARE_PROVIDER_SITE_OTHER): Payer: Medicare Other | Admitting: Cardiovascular Disease

## 2014-01-22 VITALS — BP 164/57 | HR 69 | Ht 70.0 in | Wt 196.0 lb

## 2014-01-22 DIAGNOSIS — Z8619 Personal history of other infectious and parasitic diseases: Secondary | ICD-10-CM | POA: Diagnosis not present

## 2014-01-22 DIAGNOSIS — Z87438 Personal history of other diseases of male genital organs: Secondary | ICD-10-CM | POA: Diagnosis not present

## 2014-01-22 DIAGNOSIS — Z8669 Personal history of other diseases of the nervous system and sense organs: Secondary | ICD-10-CM | POA: Insufficient documentation

## 2014-01-22 DIAGNOSIS — Z86010 Personal history of colonic polyps: Secondary | ICD-10-CM | POA: Diagnosis not present

## 2014-01-22 DIAGNOSIS — E119 Type 2 diabetes mellitus without complications: Secondary | ICD-10-CM | POA: Diagnosis not present

## 2014-01-22 DIAGNOSIS — N184 Chronic kidney disease, stage 4 (severe): Secondary | ICD-10-CM | POA: Diagnosis not present

## 2014-01-22 DIAGNOSIS — Z79899 Other long term (current) drug therapy: Secondary | ICD-10-CM | POA: Diagnosis not present

## 2014-01-22 DIAGNOSIS — R079 Chest pain, unspecified: Secondary | ICD-10-CM | POA: Insufficient documentation

## 2014-01-22 DIAGNOSIS — I151 Hypertension secondary to other renal disorders: Secondary | ICD-10-CM | POA: Diagnosis not present

## 2014-01-22 DIAGNOSIS — Z720 Tobacco use: Secondary | ICD-10-CM | POA: Insufficient documentation

## 2014-01-22 DIAGNOSIS — J209 Acute bronchitis, unspecified: Secondary | ICD-10-CM | POA: Diagnosis not present

## 2014-01-22 DIAGNOSIS — D631 Anemia in chronic kidney disease: Secondary | ICD-10-CM

## 2014-01-22 DIAGNOSIS — I350 Nonrheumatic aortic (valve) stenosis: Secondary | ICD-10-CM | POA: Diagnosis not present

## 2014-01-22 DIAGNOSIS — R05 Cough: Secondary | ICD-10-CM | POA: Diagnosis not present

## 2014-01-22 DIAGNOSIS — R0602 Shortness of breath: Secondary | ICD-10-CM | POA: Diagnosis not present

## 2014-01-22 DIAGNOSIS — Z716 Tobacco abuse counseling: Secondary | ICD-10-CM

## 2014-01-22 DIAGNOSIS — I5032 Chronic diastolic (congestive) heart failure: Secondary | ICD-10-CM

## 2014-01-22 DIAGNOSIS — Z7952 Long term (current) use of systemic steroids: Secondary | ICD-10-CM | POA: Insufficient documentation

## 2014-01-22 DIAGNOSIS — I351 Nonrheumatic aortic (valve) insufficiency: Secondary | ICD-10-CM | POA: Diagnosis not present

## 2014-01-22 DIAGNOSIS — Z794 Long term (current) use of insulin: Secondary | ICD-10-CM | POA: Diagnosis not present

## 2014-01-22 DIAGNOSIS — Z7982 Long term (current) use of aspirin: Secondary | ICD-10-CM | POA: Diagnosis not present

## 2014-01-22 DIAGNOSIS — E669 Obesity, unspecified: Secondary | ICD-10-CM | POA: Diagnosis not present

## 2014-01-22 DIAGNOSIS — I509 Heart failure, unspecified: Secondary | ICD-10-CM | POA: Insufficient documentation

## 2014-01-22 DIAGNOSIS — E876 Hypokalemia: Secondary | ICD-10-CM | POA: Diagnosis not present

## 2014-01-22 DIAGNOSIS — N2889 Other specified disorders of kidney and ureter: Secondary | ICD-10-CM

## 2014-01-22 DIAGNOSIS — I129 Hypertensive chronic kidney disease with stage 1 through stage 4 chronic kidney disease, or unspecified chronic kidney disease: Secondary | ICD-10-CM | POA: Insufficient documentation

## 2014-01-22 DIAGNOSIS — N189 Chronic kidney disease, unspecified: Secondary | ICD-10-CM

## 2014-01-22 LAB — BASIC METABOLIC PANEL
Anion gap: 13 (ref 5–15)
BUN: 94 mg/dL — ABNORMAL HIGH (ref 6–23)
CO2: 29 mmol/L (ref 19–32)
Calcium: 9.1 mg/dL (ref 8.4–10.5)
Chloride: 95 mEq/L — ABNORMAL LOW (ref 96–112)
Creatinine, Ser: 4.58 mg/dL — ABNORMAL HIGH (ref 0.50–1.35)
GFR calc non Af Amer: 12 mL/min — ABNORMAL LOW (ref >90)
GFR, EST AFRICAN AMERICAN: 14 mL/min — AB (ref >90)
Glucose, Bld: 149 mg/dL — ABNORMAL HIGH (ref 70–99)
POTASSIUM: 2.5 mmol/L — AB (ref 3.5–5.1)
Sodium: 137 mmol/L (ref 135–145)

## 2014-01-22 LAB — CBC
HEMATOCRIT: 35.9 % — AB (ref 39.0–52.0)
HEMOGLOBIN: 12 g/dL — AB (ref 13.0–17.0)
MCH: 29.4 pg (ref 26.0–34.0)
MCHC: 33.4 g/dL (ref 30.0–36.0)
MCV: 88 fL (ref 78.0–100.0)
PLATELETS: 225 10*3/uL (ref 150–400)
RBC: 4.08 MIL/uL — ABNORMAL LOW (ref 4.22–5.81)
RDW: 14.1 % (ref 11.5–15.5)
WBC: 7.7 10*3/uL (ref 4.0–10.5)

## 2014-01-22 LAB — TROPONIN I: Troponin I: 0.06 ng/mL — ABNORMAL HIGH (ref <0.031)

## 2014-01-22 MED ORDER — PREDNISONE 50 MG PO TABS
60.0000 mg | ORAL_TABLET | Freq: Once | ORAL | Status: AC
  Administered 2014-01-22: 60 mg via ORAL
  Filled 2014-01-22 (×2): qty 1

## 2014-01-22 MED ORDER — IPRATROPIUM BROMIDE 0.02 % IN SOLN: 0.5000 mg | Freq: Once | RESPIRATORY_TRACT | Status: DC

## 2014-01-22 MED ORDER — ALBUTEROL SULFATE (2.5 MG/3ML) 0.083% IN NEBU: 5.0000 mg | INHALATION_SOLUTION | Freq: Once | RESPIRATORY_TRACT | Status: DC

## 2014-01-22 MED ORDER — IPRATROPIUM-ALBUTEROL 0.5-2.5 (3) MG/3ML IN SOLN: 3.0000 mL | Freq: Once | RESPIRATORY_TRACT | Status: DC

## 2014-01-22 MED ORDER — POTASSIUM CHLORIDE CRYS ER 20 MEQ PO TBCR
40.0000 meq | EXTENDED_RELEASE_TABLET | Freq: Once | ORAL | Status: AC
  Administered 2014-01-22: 40 meq via ORAL
  Filled 2014-01-22: qty 2

## 2014-01-22 MED ORDER — AMLODIPINE BESYLATE 5 MG PO TABS: 5.0000 mg | ORAL_TABLET | Freq: Every day | ORAL | Status: DC

## 2014-01-22 MED ORDER — ALBUTEROL SULFATE (2.5 MG/3ML) 0.083% IN NEBU: 2.5000 mg | INHALATION_SOLUTION | Freq: Once | RESPIRATORY_TRACT | Status: DC

## 2014-01-22 MED ORDER — IPRATROPIUM-ALBUTEROL 0.5-2.5 (3) MG/3ML IN SOLN
3.0000 mL | Freq: Once | RESPIRATORY_TRACT | Status: AC
  Administered 2014-01-22: 3 mL via RESPIRATORY_TRACT
  Filled 2014-01-22: qty 3

## 2014-01-22 NOTE — ED Notes (Signed)
CRITICAL VALUE ALERT  Critical value received:  Potassium 2.5  Date of notification:  01/22/14  Time of notification:  23:45  Critical value read back: Yes  Nurse who received alert:  Elwanda Brooklyn, RN  MD notified:  Dr. Eulis Foster  Time of notification:  23:46

## 2014-01-22 NOTE — ED Notes (Signed)
Pt states he is short of breath the last 2 hrs. Non productive cough noted.

## 2014-01-22 NOTE — Patient Instructions (Signed)
Your physician recommends that you schedule a follow-up appointment in: 3-4 months with Dr. Bronson Ing  Your physician has recommended you make the following change in your medication:   Start: Amlodipine 5 mg Daily   Continue all other meds.   Thank you for choosing [facility]!

## 2014-01-22 NOTE — Progress Notes (Signed)
Patient ID: [name], male   DOB: 1945/03/03, 69 y.o.   MRN: [id number]      SUBJECTIVE: The patient is a 69 year old male who I am meeting for the first time today. He has a prior history of chronic systolic heart failure. However, his most recent echocardiogram on 07/16/2012 demonstrated normal left ventricular systolic function, EF 33%, mild LVH, moderate LV dilatation, grade 1 diastolic dysfunction, and mild aortic stenosis and regurgitation. He also has a history of hypertension, diabetes, obesity, sleep apnea, and chronic kidney disease stage 4 and is followed by nephrology. No ischemia seen on nuclear stress testing on 11/05/08.  He was hospitalized in November 2015 with chest pain and shortness of breath deemed secondary to acute on chronic diastolic heart failure with accelerated hypertension possibly due to missed medications.  Today, he denies chest pain. He has fairly frequent palpitations but denies dizziness and syncope. He had some shortness of breath this morning alleviated with metolazone.  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 He denies orthopnea and leg swelling. He receives EPO injections for anemia related to CKD.  Review of Systems: As per "subjective", otherwise negative.  Allergies  Allergen Reactions  . Ace Inhibitors Cough    REACTION: cough    Current Outpatient Prescriptions  Medication Sig Dispense Refill  . aspirin EC 81 MG tablet Take 81 mg by mouth daily.    . cloNIDine (CATAPRES) 0.3 MG tablet Take 1 tablet by mouth 2 (two) times daily.    . Colchicine 0.6 MG CAPS Take 0.6 mg by mouth daily.    . febuxostat (ULORIC) 40 MG tablet Take 1 tablet (40 mg total) by mouth daily. 30 tablet 2  . hydrALAZINE (APRESOLINE) 100 MG tablet Take 100 mg by mouth 2 (two) times daily.    . Insulin Glargine (LANTUS SOLOSTAR) 100 UNIT/ML Solostar Pen Inject 10 Units into the skin daily at 10  pm. 5 pen PRN  . metolazone (ZAROXOLYN) 2.5 MG tablet Take 2.5 mg by mouth daily as needed (for swelling/shortness of breath).    . metoprolol succinate (TOPROL XL) 25 MG 24 hr tablet Take 1 tablet (25 mg total) by mouth daily. 30 tablet 6  . ONE TOUCH ULTRA TEST test strip     . potassium chloride SA (K-DUR,KLOR-CON) 20 MEQ tablet Take 20 mEq by mouth daily.    Marland Kitchen torsemide (DEMADEX) 100 MG tablet Take 100 mg by mouth 2 (two) times daily.     Marland Kitchen ZOSTAVAX 00762 UNT/0.65ML injection      No current facility-administered medications for this visit.    Past Medical History  Diagnosis Date  . Congestive heart failure 10/2008    preserved LV systolic function   . Aortic insufficiency 09/2008    Normal left ventricular size and function : graded as mild by Doppler , but pulse pressure is widened  . 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 Hypertension   . 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 Erectile dysfunction   . Colonic polyp   . Low serum testosterone level   . Tobacco abuse   . Sleep apnea 2010    Initially unable to afford CPAP  . Benign prostatic hypertrophy   . Obesity, Class I, BMI 30-34.9   . Hyperlipidemia   .  Congestive heart failure (CHF)     EF 25-30% 07/2011  . Adenomatous colon polyp 2006  . H. pylori infection 2013    treated with prevpac    Past Surgical History  Procedure Laterality Date  . Retinal laser procedure      diabetic retinopathy  . Lymph node biopsy      surgical exploration of neck-not entirely clear that this represented a lymph node biopsy  . Colonoscopy  08/23/2004    Dr. Vivi Ferns rectum, diminutive polyp of the rectosigmoid removed, inflamed focally adenomatous polyp    History   Social History  . Marital Status: Married    Spouse Name: N/A    Number of Children: 4  . Years of Education: N/A   Occupational History  . Semi - Retired Dietitian    Social History Main Topics  . Smoking status: Current Some Day Smoker -- 45 years    Types: Cigarettes  . Smokeless tobacco: Current User     Comment: 1-2 cigarettes OF THE NEW ELECTRIC CIGARETTES  . Alcohol Use: 2.4 oz/week    2 Glasses of wine, 2 Shots of liquor per week     Comment: occ  . Drug Use: No  . Sexual Activity: Not on file   Other Topics Concern  . Not on file   Social History Narrative     Filed Vitals:   01/22/14 1336  BP: 164/57  Pulse: 69  Height: 5\' 10"  (1.778 m)  Weight: 196 lb (88.905 kg)    PHYSICAL EXAM General: NAD HEENT: Normal. Neck: No JVD, no thyromegaly. Lungs: Faint bibasilar dry crackles, no wheezes. CV: Nondisplaced PMI.  Regular rate and rhythm, normal S1/S2, no S3/S4, 2/6 ejections systolic murmur over RUSB and 2/4 holodiastolic murmur over RUSB and along lower left sternal border. No pretibial or periankle edema.  No carotid bruit.   Abdomen: Soft, nontender, no hepatosplenomegaly, no distention.  Neurologic: Alert and oriented x 3.  Psych: Normal affect. Skin: Normal. Musculoskeletal: Normal range of motion, no gross deformities. Extremities: No clubbing or cyanosis.   ECG: Most recent ECG reviewed.      ASSESSMENT AND PLAN: 1. Chronic diastolic heart failure: Euvolemic. Aim is to optimally control BP. Will add amlodipine 5 mg for this. Continue torsemide 100 mg bid and metolazone prn. 2. Essential HTN: Elevated today. Will start amlodipine 5 mg daily. 3. 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 No need to repeat echo at this time. Continue with present therapy. 4. CKD stage 4: Stable. Followed by nephrology. 5. Anemia secondary to CKD: Hgb 11.8 on 01/21/14. Receives EPO injections. 6. Tobacco abuse: Cessation counseling provided, and he appears motivated to quit.  Dispo: f/u 3-4 months.  Kate Sable, M.D., F.A.C.C.

## 2014-01-22 NOTE — ED Provider Notes (Signed)
CSN: [id number]     Arrival date & time 01/22/14  2222 History  This chart was scribed for Harold Blade, MD by Delphia Grates, ED Scribe. This patient was seen in room APA05/APA05 and the patient's care was started at 10:45 PM.   Chief Complaint  Patient presents with  . Shortness of Breath    The history is provided by the patient and the spouse. No language interpreter was used.     HPI Comments: [name] is a 69 y.o. male, with history of CHF, DM, HLD, and HTN, who presents to the Emergency Department complaining of sudden osnet moderate SOB that began approximately 2 hours ago. Patient states he was at bible study tonight when he suddenly became short of breath. There is associated dry cough with mild generalized chest discomfort. He reports history of the same 2 months ago, and reports this was due to fluid in the lungs. Per spouse, he was given a breathing treatment, steroids, and CXR. Patient states he was fine yesterday and denies any precipitating factors. Patient reports he ate dinner tonight and is compliant with all his medications. Patient does not use an inhaler.  Patient was seen at [facility] today, for a routine check up by his cardiologist, but was not short of breath at that time.  He was given a prescription for amlodipine, for mildly elevated blood pressure.  Patient has not started taking the amlodipine yet.  Patient has a history of diastolic heart failure.  He denies any pain to the bilateral lower extremities.  There are no other known modifying factors.   Past Medical History  Diagnosis Date  . Congestive heart failure 10/2008    preserved LV systolic function   . Aortic insufficiency 09/2008    Normal left ventricular size and function : graded as mild by Doppler , but pulse pressure is widened  . 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 Hypertension   . Chronic kidney disease (CKD), stage IV (severe) 12/2009    Creatinine of 2.2 in  3/11 and 2.7 in 8/12  . Erectile dysfunction   . Colonic polyp   . Low serum testosterone level   . Tobacco abuse   . Sleep apnea 2010    Initially unable to afford CPAP  . Benign prostatic hypertrophy   . Obesity, Class I, BMI 30-34.9   . Hyperlipidemia   . Congestive heart failure (CHF)     EF 25-30% 07/2011  . Adenomatous colon polyp 2006  . H. pylori infection 2013    treated with prevpac   Past Surgical History  Procedure Laterality Date  . Retinal laser procedure      diabetic retinopathy  . Lymph node biopsy      surgical exploration of neck-not entirely clear that this represented a lymph node biopsy  . Colonoscopy  08/23/2004    Dr. Vivi Ferns rectum, diminutive polyp of the rectosigmoid removed, inflamed focally adenomatous polyp   Family History  Problem Relation Age of Onset  . Hypertension Mother   . Cancer Mother   . Cancer Father   . Diabetes Sister    History  Substance Use Topics  . Smoking status: Current Every Day Smoker -- 0.25 packs/day for 45 years    Types: Cigarettes    Start date: 05/31/1958  . Smokeless tobacco: Current User     Comment: 1-2 cigarettes OF THE NEW ELECTRIC CIGARETTES  . Alcohol Use: 2.4 oz/week  2 Glasses of wine, 2 Shots of liquor per week     Comment: occ    Review of Systems  Constitutional: Negative for fever and appetite change.  Respiratory: Positive for cough and shortness of breath.   Cardiovascular: Positive for chest pain (discomfort).  All other systems reviewed and are negative.     Allergies  Ace inhibitors  Home Medications   Prior to Admission medications   Medication Sig Start Date End Date Taking? Authorizing Provider  amLODipine (NORVASC) 5 MG tablet Take 1 tablet (5 mg total) by mouth daily. 01/22/14   Herminio Commons, MD  aspirin EC 81 MG tablet Take 81 mg by mouth daily.    Historical Provider, MD  cloNIDine (CATAPRES) 0.3 MG tablet Take 1 tablet by mouth 2 (two) times daily. 11/28/13    Historical Provider, MD  Colchicine 0.6 MG CAPS Take 0.6 mg by mouth daily.    Historical Provider, MD  febuxostat (ULORIC) 40 MG tablet Take 1 tablet (40 mg total) by mouth daily. 04/11/13   Alycia Rossetti, MD  hydrALAZINE (APRESOLINE) 100 MG tablet Take 100 mg by mouth 2 (two) times daily.    Historical Provider, MD  Insulin Glargine (LANTUS SOLOSTAR) 100 UNIT/ML Solostar Pen Inject 10 Units into the skin daily at 10 pm. 07/11/13   Alycia Rossetti, MD  metolazone (ZAROXOLYN) 2.5 MG tablet Take 2.5 mg by mouth daily as needed (for swelling/shortness of breath).    Historical Provider, MD  metoprolol succinate (TOPROL XL) 25 MG 24 hr tablet Take 1 tablet (25 mg total) by mouth daily. 05/30/13   Lendon Colonel, NP  potassium chloride SA (K-DUR,KLOR-CON) 20 MEQ tablet Take 20 mEq by mouth daily.    Historical Provider, MD  potassium chloride SA (K-DUR,KLOR-CON) 20 MEQ tablet Take 1 tablet (20 mEq total) by mouth daily. 01/23/14   Harold Blade, MD  predniSONE (DELTASONE) 20 MG tablet Take 1 tablet (20 mg total) by mouth 2 (two) times daily. 01/23/14   Harold Blade, MD  torsemide (DEMADEX) 100 MG tablet Take 100 mg by mouth 2 (two) times daily.     Historical Provider, MD  ZOSTAVAX 28413 UNT/0.65ML injection  11/21/13   Historical Provider, MD   Triage Vitals: BP 194/56 mmHg  Pulse 90  Temp(Src) 98.2 F (36.8 C) (Oral)  Resp 16  Ht 5\' 10"  (1.778 m)  Wt 197 lb (89.359 kg)  BMI 28.27 kg/m2  SpO2 94%  Physical Exam  Constitutional: He is oriented to person, place, and time. He appears well-developed and well-nourished.  HENT:  Head: Normocephalic and atraumatic.  Right Ear: External ear normal.  Left Ear: External ear normal.  Eyes: Conjunctivae and EOM are normal. Pupils are equal, round, and reactive to light.  Neck: Normal range of motion and phonation normal. Neck supple.  Cardiovascular: Normal rate, regular rhythm and normal heart sounds.   Pulmonary/Chest: Effort normal. He has  wheezes. He has rhonchi. He exhibits no bony tenderness.  Decreased air movement bilaterally with scattered rhonchi and wheezing.  Abdominal: Soft. There is no tenderness.  Musculoskeletal: Normal range of motion. He exhibits no edema or tenderness.  No leg pain or swelling.  Neurological: He is alert and oriented to person, place, and time. No cranial nerve deficit or sensory deficit. He exhibits normal muscle tone. Coordination normal.  Skin: Skin is warm, dry and intact.  Psychiatric: He has a normal mood and affect. His behavior is normal. Judgment and thought content normal.  Nursing note and vitals reviewed.   ED Course  Procedures (including critical care time)  DIAGNOSTIC STUDIES: Oxygen Saturation is 94% on room air, adequate by my interpretation.    COORDINATION OF CARE: At 2251 Discussed treatment plan with patient which includes labs. Patient agrees.   Medications  predniSONE (DELTASONE) tablet 60 mg (60 mg Oral Given 01/22/14 2256)  ipratropium-albuterol (DUONEB) 0.5-2.5 (3) MG/3ML nebulizer solution 3 mL (3 mLs Nebulization Given 01/22/14 2308)  potassium chloride SA (K-DUR,KLOR-CON) CR tablet 40 mEq (40 mEq Oral Given 01/22/14 2359)  aerochamber Z-Stat Plus/medium 1 each (1 each Other Given 01/23/14 0028)    Patient Vitals for the past 24 hrs:  BP Temp Temp src Pulse Resp SpO2 Height Weight  01/23/14 0230 (!) 144/43 mmHg - - 76 16 98 % - -  01/23/14 0200 (!) 156/53 mmHg - - 81 21 95 % - -  01/23/14 0105 (!) 159/50 mmHg - - 86 12 100 % - -  01/23/14 0028 - - - - - 100 % - -  01/22/14 2311 - - - - - 100 % - -  01/22/14 2227 194/56 mmHg 98.2 F (36.8 C) Oral 90 16 94 % 5\' 10"  (1.778 m) 197 lb (89.359 kg)    12:16 AM Reevaluation with update and discussion. After initial assessment and treatment, an updated evaluation reveals he feels better, after nebulizer treatment.  Findings discussed with the patient.  He understands that he will be staying for a repeat troponin level.   All questions answered.[name] L    Labs Review Labs Reviewed  BASIC METABOLIC PANEL - Abnormal; Notable for the following:    Potassium 2.5 (*)    Chloride 95 (*)    Glucose, Bld 149 (*)    BUN 94 (*)    Creatinine, Ser 4.58 (*)    GFR calc non Af Amer 12 (*)    GFR calc Af Amer 14 (*)    All other components within normal limits  BRAIN NATRIURETIC PEPTIDE - Abnormal; Notable for the following:    B Natriuretic Peptide >4500.0 (*)    All other components within normal limits  CBC - Abnormal; Notable for the following:    RBC 4.08 (*)    Hemoglobin 12.0 (*)    HCT 35.9 (*)    All other components within normal limits  TROPONIN I - Abnormal; Notable for the following:    Troponin I 0.06 (*)    All other components within normal limits  TROPONIN I - Abnormal; Notable for the following:    Troponin I 0.08 (*)    All other components within normal limits    Imaging Review Dg Chest Port 1 View  01/22/2014   CLINICAL DATA:  Shortness of breath.  Nonproductive cough today.  EXAM: PORTABLE CHEST - 1 VIEW  COMPARISON:  12/01/2013  FINDINGS: Cardiac enlargement with diffuse vascular congestion, similar prior study. No developing airspace disease or edema. No blunting of costophrenic angles. No pneumothorax. Calcified and tortuous aorta. No pneumothorax.  IMPRESSION: Cardiac enlargement and diffuse vascular congestion. No evidence of edema or consolidation.   Electronically Signed   By: Lucienne Capers M.D.   On: 01/22/2014 23:16     EKG Interpretation   Date/Time:  Wednesday January 22 2014 22:36:53 EST Ventricular Rate:  96 PR Interval:  183 QRS Duration: 131 QT Interval:  375 QTC Calculation: 474 R Axis:   5 Text Interpretation:  Sinus rhythm Left bundle branch block Baseline  wander in  lead(s) II III aVF since last tracing no significant change  Confirmed by [facility]  MD, Rody Keadle [phone]) on 01/22/2014 11:11:07 PM      MDM   Final diagnoses:  Acute bronchitis,  unspecified organism  Hypokalemia    Evaluation is consistent with bronchitis.  Incidental elevation of troponin I, required recheck.  Clinically, I doubt that this represents ACS.  Incidental hypokalemia.  Patient states he is currently taking 20 mEq twice a day potassium.  Nursing Notes Reviewed/ Care Coordinated, and agree without changes. Applicable Imaging Reviewed.  Interpretation of Laboratory Data incorporated into ED treatment  Plan: Care to Dr. Lita Mains to evaluate after Delta Troponin.  I personally performed the services described in this documentation, which was scribed in my presence. The recorded information has been reviewed and is accurate.      Harold Blade, MD 01/23/14 [phone]

## 2014-01-23 LAB — TROPONIN I: Troponin I: 0.08 ng/mL — ABNORMAL HIGH (ref <0.031)

## 2014-01-23 LAB — BRAIN NATRIURETIC PEPTIDE: B Natriuretic Peptide: >4500 pg/mL — ABNORMAL HIGH (ref 0.0–100.0)

## 2014-01-23 MED ORDER — ALBUTEROL SULFATE HFA 108 (90 BASE) MCG/ACT IN AERS
2.0000 | INHALATION_SPRAY | RESPIRATORY_TRACT | Status: DC | PRN
  Administered 2014-01-23: 2 via RESPIRATORY_TRACT
  Filled 2014-01-23: qty 6.7

## 2014-01-23 MED ORDER — PREDNISONE 20 MG PO TABS: 20.0000 mg | ORAL_TABLET | Freq: Two times a day (BID) | ORAL | Status: DC

## 2014-01-23 MED ORDER — POTASSIUM CHLORIDE CRYS ER 20 MEQ PO TBCR: 20.0000 meq | EXTENDED_RELEASE_TABLET | Freq: Every day | ORAL | Status: DC

## 2014-01-23 MED ORDER — AEROCHAMBER Z-STAT PLUS/MEDIUM MISC
1.0000 | Freq: Once | Status: AC
  Administered 2014-01-23: 1

## 2014-01-23 NOTE — ED Provider Notes (Signed)
Assumed care from Dr. Eulis Foster pending repeat troponin. Troponin without significant change. Patient continues to have no chest pain. Vital signs stable. We'll discharge as previously discussed.  Julianne Rice, MD 01/23/14 [phone]

## 2014-01-23 NOTE — Discharge Instructions (Signed)
Take an extra 20 mEq of potassium, each day, in addition to usual dose. Use the albuterol inhaler 2 puffs every 3-4 hours as needed for cough or trouble breathing. Start the prednisone prescription, in the morning.    Acute Bronchitis Bronchitis is inflammation of the airways that extend from the windpipe into the lungs (bronchi). The inflammation often causes mucus to develop. This leads to a cough, which is the most common symptom of bronchitis.  In acute bronchitis, the condition usually develops suddenly and goes away over time, usually in a couple weeks. Smoking, allergies, and asthma can make bronchitis worse. Repeated episodes of bronchitis may cause further lung problems.  CAUSES Acute bronchitis is most often caused by the same virus that causes a cold. The virus can spread from person to person (contagious) through coughing, sneezing, and touching contaminated objects. SIGNS AND SYMPTOMS   Cough.   Fever.   Coughing up mucus.   Body aches.   Chest congestion.   Chills.   Shortness of breath.   Sore throat.  DIAGNOSIS  Acute bronchitis is usually diagnosed through a physical exam. Your health care provider will also ask you questions about your medical history. Tests, such as chest X-rays, are sometimes done to rule out other conditions.  TREATMENT  Acute bronchitis usually goes away in a couple weeks. Oftentimes, no medical treatment is necessary. Medicines are sometimes given for relief of fever or cough. Antibiotic medicines are usually not needed but may be prescribed in certain situations. In some cases, an inhaler may be recommended to help reduce shortness of breath and control the cough. A cool mist vaporizer may also be used to help thin bronchial secretions and make it easier to clear the chest.  HOME CARE INSTRUCTIONS  Get plenty of rest.   Drink enough fluids to keep your urine clear or pale yellow (unless you have a medical condition that requires  fluid restriction). Increasing fluids may help thin your respiratory secretions (sputum) and reduce chest congestion, and it will prevent dehydration.   Take medicines only as directed by your health care provider.  If you were prescribed an antibiotic medicine, finish it all even if you start to feel better.  Avoid smoking and secondhand smoke. Exposure to cigarette smoke or irritating chemicals will make bronchitis worse. If you are a smoker, consider using nicotine gum or skin patches to help control withdrawal symptoms. Quitting smoking will help your lungs heal faster.   Reduce the chances of another bout of acute bronchitis by washing your hands frequently, avoiding people with cold symptoms, and trying not to touch your hands to your mouth, nose, or eyes.   Keep all follow-up visits as directed by your health care provider.  SEEK MEDICAL CARE IF: Your symptoms do not improve after 1 week of treatment.  SEEK IMMEDIATE MEDICAL CARE IF:  You develop an increased fever or chills.   You have chest pain.   You have severe shortness of breath.  You have bloody sputum.   You develop dehydration.  You faint or repeatedly feel like you are going to pass out.  You develop repeated vomiting.  You develop a severe headache. MAKE SURE YOU:   Understand these instructions.  Will watch your condition.  Will get help right away if you are not doing well or get worse. Document Released: 02/11/2004 Document Revised: 05/20/2013 Document Reviewed: 06/26/2012 [facility] Patient Information 2015 [place], [place]. This information is not intended to replace advice given to you by your  health care provider. Make sure you discuss any questions you have with your health care provider.  Potassium Content of Foods Potassium is a mineral found in many foods and drinks. It helps keep fluids and minerals balanced in your body and affects how steadily your heart beats. Potassium also helps control  your blood pressure and keep your muscles and nervous system healthy. Certain health conditions and medicines may change the balance of potassium in your body. When this happens, you can help balance your level of potassium through the foods that you do or do not eat. Your health care provider or dietitian may recommend an amount of potassium that you should have each day. The following lists of foods provide the amount of potassium (in parentheses) per serving in each item. HIGH IN POTASSIUM  The following foods and beverages have 200 mg or more of potassium per serving:  Apricots, 2 raw or 5 dry (200 mg).  Artichoke, 1 medium (345 mg).  Avocado, raw,  each (245 mg).  Banana, 1 medium (425 mg).  Beans, lima, or baked beans, canned,  cup (280 mg).  Beans, white, canned,  cup (595 mg).  Beef roast, 3 oz (320 mg).  Beef, ground, 3 oz (270 mg).  Beets, raw or cooked,  cup (260 mg).  Bran muffin, 2 oz (300 mg).  Broccoli,  cup (230 mg).  Brussels sprouts,  cup (250 mg).  Cantaloupe,  cup (215 mg).  Cereal, 100% bran,  cup (200-400 mg).  Cheeseburger, single, fast food, 1 each (225-400 mg).  Chicken, 3 oz (220 mg).  Clams, canned, 3 oz (535 mg).  Crab, 3 oz (225 mg).  Dates, 5 each (270 mg).  Dried beans and peas,  cup (300-475 mg).  Figs, dried, 2 each (260 mg).  Fish: halibut, tuna, cod, snapper, 3 oz (480 mg).  Fish: salmon, haddock, swordfish, perch, 3 oz (300 mg).  Fish, tuna, canned 3 oz (200 mg).  [place] fries, fast food, 3 oz (470 mg).  Granola with fruit and nuts,  cup (200 mg).  Grapefruit juice,  cup (200 mg).  Greens, beet,  cup (655 mg).  Honeydew melon,  cup (200 mg).  Kale, raw, 1 cup (300 mg).  Kiwi, 1 medium (240 mg).  Kohlrabi, rutabaga, parsnips,  cup (280 mg).  Lentils,  cup (365 mg).  Mango, 1 each (325 mg).  Milk, chocolate, 1 cup (420 mg).  Milk: nonfat, low-fat, whole, buttermilk, 1 cup (350-380  mg).  Molasses, 1 Tbsp (295 mg).  Mushrooms,  cup (280) mg.  Nectarine, 1 each (275 mg).  Nuts: almonds, peanuts, hazelnuts, [place], cashew, mixed, 1 oz (200 mg).  Nuts, pistachios, 1 oz (295 mg).  Orange, 1 each (240 mg).  Orange juice,  cup (235 mg).  Papaya, medium,  fruit (390 mg).  Peanut butter, chunky, 2 Tbsp (240 mg).  Peanut butter, smooth, 2 Tbsp (210 mg).  Pear, 1 medium (200 mg).  Pomegranate, 1 whole (400 mg).  Pomegranate juice,  cup (215 mg).  Pork, 3 oz (350 mg).  Potato chips, salted, 1 oz (465 mg).  Potato, baked with skin, 1 medium (925 mg).  Potatoes, boiled,  cup (255 mg).  Potatoes, mashed,  cup (330 mg).  Prune juice,  cup (370 mg).  Prunes, 5 each (305 mg).  Pudding, chocolate,  cup (230 mg).  Pumpkin, canned,  cup (250 mg).  Raisins, seedless,  cup (270 mg).  Seeds, sunflower or pumpkin, 1 oz (240 mg).  Soy milk,  1 cup (300 mg).  Spinach,  cup (420 mg).  Spinach, canned,  cup (370 mg).  Sweet potato, baked with skin, 1 medium (450 mg).  Swiss chard,  cup (480 mg).  Tomato or vegetable juice,  cup (275 mg).  Tomato sauce or puree,  cup (400-550 mg).  Tomato, raw, 1 medium (290 mg).  Tomatoes, canned,  cup (200-300 mg).  [place], 3 oz (250 mg).  Wheat germ, 1 oz (250 mg).  Winter squash,  cup (250 mg).  Yogurt, plain or fruited, 6 oz (260-435 mg).  Zucchini,  cup (220 mg). MODERATE IN POTASSIUM The following foods and beverages have 50-200 mg of potassium per serving:  Apple, 1 each (150 mg).  Apple juice,  cup (150 mg).  Applesauce,  cup (90 mg).  Apricot nectar,  cup (140 mg).  Asparagus, small spears,  cup or 6 spears (155 mg).  Bagel, cinnamon raisin, 1 each (130 mg).  Bagel, egg or plain, 4 in., 1 each (70 mg).  Beans, green,  cup (90 mg).  Beans, yellow,  cup (190 mg).  Beer, regular, 12 oz (100 mg).  Beets, canned,  cup (125 mg).  Blackberries,  cup (115  mg).  Blueberries,  cup (60 mg).  Bread, whole wheat, 1 slice (70 mg).  Broccoli, raw,  cup (145 mg).  Cabbage,  cup (150 mg).  Carrots, cooked or raw,  cup (180 mg).  Cauliflower, raw,  cup (150 mg).  Celery, raw,  cup (155 mg).  Cereal, bran flakes, cup (120-150 mg).  Cheese, cottage,  cup (110 mg).  Cherries, 10 each (150 mg).  Chocolate, 1 oz bar (165 mg).  Coffee, brewed 6 oz (90 mg).  Corn,  cup or 1 ear (195 mg).  Cucumbers,  cup (80 mg).  Egg, large, 1 each (60 mg).  Eggplant,  cup (60 mg).  Endive, raw, cup (80 mg).  English muffin, 1 each (65 mg).  Fish, orange roughy, 3 oz (150 mg).  Frankfurter, beef or pork, 1 each (75 mg).  Fruit cocktail,  cup (115 mg).  Grape juice,  cup (170 mg).  Grapefruit,  fruit (175 mg).  Grapes,  cup (155 mg).  Greens: kale, turnip, collard,  cup (110-150 mg).  Ice cream or frozen yogurt, chocolate,  cup (175 mg).  Ice cream or frozen yogurt, vanilla,  cup (120-150 mg).  Lemons, limes, 1 each (80 mg).  Lettuce, all types, 1 cup (100 mg).  Mixed vegetables,  cup (150 mg).  Mushrooms, raw,  cup (110 mg).  Nuts: walnuts, pecans, or macadamia, 1 oz (125 mg).  Oatmeal,  cup (80 mg).  Okra,  cup (110 mg).  Onions, raw,  cup (120 mg).  Peach, 1 each (185 mg).  Peaches, canned,  cup (120 mg).  Pears, canned,  cup (120 mg).  Peas, green, frozen,  cup (90 mg).  Peppers, green,  cup (130 mg).  Peppers, red,  cup (160 mg).  Pineapple juice,  cup (165 mg).  Pineapple, fresh or canned,  cup (100 mg).  Plums, 1 each (105 mg).  Pudding, vanilla,  cup (150 mg).  Raspberries,  cup (90 mg).  Rhubarb,  cup (115 mg).  Rice, wild,  cup (80 mg).  Shrimp, 3 oz (155 mg).  Spinach, raw, 1 cup (170 mg).  Strawberries,  cup (125 mg).  Summer squash  cup (175-200 mg).  Swiss chard, raw, 1 cup (135 mg).  Tangerines, 1 each (140 mg).  Tea, brewed, 6 oz (65  mg).  Turnips,  cup (140 mg).  Watermelon,  cup (85 mg).  Wine, red, table, 5 oz (180 mg).  Wine, white, table, 5 oz (100 mg). LOW IN POTASSIUM The following foods and beverages have less than 50 mg of potassium per serving.  Bread, white, 1 slice (30 mg).  Carbonated beverages, 12 oz (less than 5 mg).  Cheese, 1 oz (20-30 mg).  Cranberries,  cup (45 mg).  Cranberry juice cocktail,  cup (20 mg).  Fats and oils, 1 Tbsp (less than 5 mg).  Hummus, 1 Tbsp (32 mg).  Nectar: papaya, mango, or pear,  cup (35 mg).  Rice, white or brown,  cup (50 mg).  Spaghetti or macaroni,  cup cooked (30 mg).  Tortilla, flour or corn, 1 each (50 mg).  Waffle, 4 in., 1 each (50 mg).  Water chestnuts,  cup (40 mg). Document Released: 08/17/2004 Document Revised: 01/08/2013 Document Reviewed: 11/30/2012 [facility] Patient Information 2015 [place], [place]. This information is not intended to replace advice given to you by your health care provider. Make sure you discuss any questions you have with your health care provider.  Hypokalemia Hypokalemia means that the amount of potassium in the blood is lower than normal.Potassium is a chemical, called an electrolyte, that helps regulate the amount of fluid in the body. It also stimulates muscle contraction and helps nerves function properly.Most of the body's potassium is inside of cells, and only a very small amount is in the blood. Because the amount in the blood is so small, minor changes can be life-threatening. CAUSES  Antibiotics.  Diarrhea or vomiting.  Using laxatives too much, which can cause diarrhea.  Chronic kidney disease.  Water pills (diuretics).  Eating disorders (bulimia).  Low magnesium level.  Sweating a lot. SIGNS AND SYMPTOMS  Weakness.  Constipation.  Fatigue.  Muscle cramps.  Mental confusion.  Skipped heartbeats or irregular heartbeat (palpitations).  Tingling or numbness. DIAGNOSIS  Your  health care provider can diagnose hypokalemia with blood tests. In addition to checking your potassium level, your health care provider may also check other lab tests. TREATMENT Hypokalemia can be treated with potassium supplements taken by mouth or adjustments in your current medicines. If your potassium level is very low, you may need to get potassium through a vein (IV) and be monitored in the hospital. A diet high in potassium is also helpful. Foods high in potassium are:  Nuts, such as peanuts and pistachios.  Seeds, such as sunflower seeds and pumpkin seeds.  Peas, lentils, and lima beans.  Whole grain and bran cereals and breads.  Fresh fruit and vegetables, such as apricots, avocado, bananas, cantaloupe, kiwi, oranges, tomatoes, asparagus, and potatoes.  Orange and tomato juices.  Red meats.  Fruit yogurt. HOME CARE INSTRUCTIONS  Take all medicines as prescribed by your health care provider.  Maintain a healthy diet by including nutritious food, such as fruits, vegetables, nuts, whole grains, and lean meats.  If you are taking a laxative, be sure to follow the directions on the label. SEEK MEDICAL CARE IF:  Your weakness gets worse.  You feel your heart pounding or racing.  You are vomiting or having diarrhea.  You are diabetic and having trouble keeping your blood glucose in the normal range. SEEK IMMEDIATE MEDICAL CARE IF:  You have chest pain, shortness of breath, or dizziness.  You are vomiting or having diarrhea for more than 2 days.  You faint. MAKE SURE YOU:   Understand these instructions.  Will watch  your condition.  Will get help right away if you are not doing well or get worse. Document Released: 01/03/2005 Document Revised: 10/24/2012 Document Reviewed: 07/06/2012 [facility] Patient Information 2015 [place], [place]. This information is not intended to replace advice given to you by your health care provider. Make sure you discuss any questions you  have with your health care provider.

## 2014-01-28 ENCOUNTER — Ambulatory Visit (HOSPITAL_COMMUNITY): Payer: Medicare Other

## 2014-01-30 ENCOUNTER — Ambulatory Visit (INDEPENDENT_AMBULATORY_CARE_PROVIDER_SITE_OTHER): Payer: Medicare Other | Admitting: Family Medicine

## 2014-01-30 ENCOUNTER — Encounter: Payer: Self-pay | Admitting: Family Medicine

## 2014-01-30 VITALS — BP 150/50 | HR 78 | Temp 98.3°F | Resp 18 | Ht 71.0 in | Wt 200.0 lb

## 2014-01-30 DIAGNOSIS — Z794 Long term (current) use of insulin: Secondary | ICD-10-CM | POA: Diagnosis not present

## 2014-01-30 DIAGNOSIS — E119 Type 2 diabetes mellitus without complications: Secondary | ICD-10-CM | POA: Diagnosis not present

## 2014-01-30 DIAGNOSIS — I10 Essential (primary) hypertension: Secondary | ICD-10-CM | POA: Diagnosis not present

## 2014-01-30 DIAGNOSIS — E785 Hyperlipidemia, unspecified: Secondary | ICD-10-CM

## 2014-01-30 DIAGNOSIS — N184 Chronic kidney disease, stage 4 (severe): Secondary | ICD-10-CM | POA: Diagnosis not present

## 2014-01-30 LAB — LIPID PANEL
Cholesterol: 158 mg/dL (ref 0–200)
HDL: 71 mg/dL (ref >39)
LDL CALC: 76 mg/dL (ref 0–99)
TRIGLYCERIDES: 55 mg/dL (ref <150)
Total CHOL/HDL Ratio: 2.2 Ratio
VLDL: 11 mg/dL (ref 0–40)

## 2014-01-30 LAB — COMPLETE METABOLIC PANEL WITH GFR
ALK PHOS: 59 U/L (ref 39–117)
ALT: 11 U/L (ref 0–53)
AST: 11 U/L (ref 0–37)
Albumin: 3.7 g/dL (ref 3.5–5.2)
BILIRUBIN TOTAL: 0.5 mg/dL (ref 0.2–1.2)
BUN: 72 mg/dL — AB (ref 6–23)
CO2: 25 mEq/L (ref 19–32)
Calcium: 9 mg/dL (ref 8.4–10.5)
Chloride: 101 mEq/L (ref 96–112)
Creat: 3.33 mg/dL — ABNORMAL HIGH (ref 0.50–1.35)
GFR, Est African American: 20 mL/min — ABNORMAL LOW
GFR, Est Non African American: 18 mL/min — ABNORMAL LOW
Glucose, Bld: 70 mg/dL (ref 70–99)
POTASSIUM: 3.5 meq/L (ref 3.5–5.3)
Sodium: 140 mEq/L (ref 135–145)
Total Protein: 6.2 g/dL (ref 6.0–8.3)

## 2014-01-30 LAB — HEMOGLOBIN A1C
HEMOGLOBIN A1C: 6.6 % — AB (ref <5.7)
Mean Plasma Glucose: 143 mg/dL — ABNORMAL HIGH (ref <117)

## 2014-01-30 NOTE — Progress Notes (Signed)
Subjective:    Patient ID: [name], male    DOB: 02-Nov-1945, 69 y.o.   MRN: [id number]  HPI Patient is here for follow-up of his insulin-dependent diabetes. He is not taking his Lantus consistently. Some days he takes 10 units. Some days he takes 15. Some days he takes 20. He has stage IV chronic kidney disease but he does not want to go on dialysis. His blood pressure is elevated today but he has not taken his blood pressure medication today. He denies any chest pain shortness of breath or dyspnea on exertion. He denies any polyuria, polydipsia, or blurred vision. He denies any numbness or tingling in his feet. His diabetic eye exam is being scheduled for later this year. He is due for diabetic foot exam. Past Medical History  Diagnosis Date  . Congestive heart failure 10/2008    preserved LV systolic function   . Aortic insufficiency 09/2008    Normal left ventricular size and function : graded as mild by Doppler , but pulse pressure is widened  . 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 Hypertension   . 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 Erectile dysfunction   . Colonic polyp   . Low serum testosterone level   . Tobacco abuse   . Sleep apnea 2010    Initially unable to afford CPAP  . Benign prostatic hypertrophy   . Obesity, Class I, BMI 30-34.9   . Hyperlipidemia   . Congestive heart failure (CHF)     EF 25-30% 07/2011  . Adenomatous colon polyp 2006  . H. pylori infection 2013    treated with prevpac   Past Surgical History  Procedure Laterality Date  . Retinal laser procedure      diabetic retinopathy  . Lymph node biopsy      surgical exploration of neck-not entirely clear that this represented a lymph node biopsy  . Colonoscopy  08/23/2004    Dr. Vivi Ferns rectum, diminutive polyp of the rectosigmoid removed, inflamed focally adenomatous polyp   Current Outpatient  Prescriptions on File Prior to Visit  Medication Sig Dispense Refill  . amLODipine (NORVASC) 5 MG tablet Take 1 tablet (5 mg total) by mouth daily. 90 tablet 3  . aspirin EC 81 MG tablet Take 81 mg by mouth daily.    . cloNIDine (CATAPRES) 0.3 MG tablet Take 1 tablet by mouth 2 (two) times daily.    . Colchicine 0.6 MG CAPS Take 0.6 mg by mouth daily.    . febuxostat (ULORIC) 40 MG tablet Take 1 tablet (40 mg total) by mouth daily. 30 tablet 2  . hydrALAZINE (APRESOLINE) 100 MG tablet Take 100 mg by mouth 2 (two) times daily.    . Insulin Glargine (LANTUS SOLOSTAR) 100 UNIT/ML Solostar Pen Inject 10 Units into the skin daily at 10 pm. (Patient taking differently: Inject 15-18 Units into the skin daily at 10 pm. ) 5 pen PRN  . metolazone (ZAROXOLYN) 2.5 MG tablet Take 2.5 mg by mouth daily as needed (for swelling/shortness of breath).    . metoprolol succinate (TOPROL XL) 25 MG 24 hr tablet Take 1 tablet (25 mg total) by mouth daily. 30 tablet 6  . potassium chloride SA (K-DUR,KLOR-CON) 20 MEQ tablet Take 20 mEq by mouth daily.    . potassium chloride SA (K-DUR,KLOR-CON) 20 MEQ tablet Take 1 tablet (  20 mEq total) by mouth daily. 7 tablet 0  . torsemide (DEMADEX) 100 MG tablet Take 100 mg by mouth 2 (two) times daily.      No current facility-administered medications on file prior to visit.   Allergies  Allergen Reactions  . Ace Inhibitors Cough    REACTION: cough   History   Social History  . Marital Status: Married    Spouse Name: N/A    Number of Children: 4  . Years of Education: N/A   Occupational History  . Semi - Retired Administrator    Social History Main Topics  . Smoking status: Current Every Day Smoker -- 0.25 packs/day for 45 years    Types: Cigarettes    Start date: 05/31/1958  . Smokeless tobacco: Current User     Comment: 1-2 cigarettes OF THE NEW ELECTRIC CIGARETTES  . Alcohol Use: 2.4 oz/week    2 Glasses of wine, 2 Shots of liquor per week     Comment: occ  .  Drug Use: No  . Sexual Activity: Not on file   Other Topics Concern  . Not on file   Social History Narrative      Review of Systems  All other systems reviewed and are negative.      Objective:   Physical Exam  Constitutional: He appears well-developed and well-nourished.  Cardiovascular: Normal rate, regular rhythm, normal heart sounds and intact distal pulses.   No murmur heard. Pulmonary/Chest: Effort normal and breath sounds normal. No respiratory distress. He has no wheezes. He has no rales. He exhibits no tenderness.  Abdominal: Soft. Bowel sounds are normal. He exhibits no distension. There is no tenderness. There is no rebound and no guarding.  Musculoskeletal: He exhibits no edema.  Vitals reviewed.         Assessment & Plan:  Diabetes mellitus, type II, insulin dependent - Plan: COMPLETE METABOLIC PANEL WITH GFR, Lipid panel, Hemoglobin A1c  Chronic kidney disease (CKD), stage IV (severe)  Hyperlipidemia  Essential hypertension  I advised the patient to take his blood pressure medication immediately to lower his blood pressure. I also recommended smoking cessation. I recommended a diabetic eye exam. Diabetic foot exam is normal. I recommended the patient be compliant in taking his Lantus 15 units daily. I will also check a CMP, hemoglobin A1c, and fasting lipid panel. Goal LDL cholesterol is less than 100. Immunizations are up-to-date.

## 2014-01-31 ENCOUNTER — Encounter: Payer: Self-pay | Admitting: *Deleted

## 2014-02-07 ENCOUNTER — Other Ambulatory Visit (HOSPITAL_COMMUNITY): Payer: Medicare Other

## 2014-02-11 ENCOUNTER — Encounter (HOSPITAL_COMMUNITY)
Admission: RE | Admit: 2014-02-11 | Discharge: 2014-02-11 | Disposition: A | Discharge status: Home / Self Care | Payer: Medicare Other | Source: Ambulatory Visit | Attending: Nephrology | Admitting: Nephrology

## 2014-02-11 ENCOUNTER — Ambulatory Visit: Payer: Medicare Other | Admitting: Cardiovascular Disease

## 2014-02-11 DIAGNOSIS — N184 Chronic kidney disease, stage 4 (severe): Secondary | ICD-10-CM | POA: Diagnosis not present

## 2014-02-11 DIAGNOSIS — D638 Anemia in other chronic diseases classified elsewhere: Secondary | ICD-10-CM | POA: Diagnosis not present

## 2014-02-11 LAB — HEMOGLOBIN AND HEMATOCRIT, BLOOD
HEMATOCRIT: 37.8 % — AB (ref 39.0–52.0)
Hemoglobin: 12.4 g/dL — ABNORMAL LOW (ref 13.0–17.0)

## 2014-02-11 NOTE — Progress Notes (Signed)
Here for labs and aranesp if indicated. Dx CKD and anemia.

## 2014-02-11 NOTE — Progress Notes (Signed)
Results for RAY, GERVASI (MRN [id number]) as of 02/11/2014 07:48  Ref. Range 02/11/2014 07:55  Hemoglobin Latest Range: 13.0-17.0 g/dL 12.4 (L)  HCT Latest Range: 39.0-52.0 % 37.8 (L)

## 2014-02-13 ENCOUNTER — Other Ambulatory Visit: Payer: Self-pay | Admitting: Adult Health

## 2014-02-26 ENCOUNTER — Encounter: Payer: Self-pay | Admitting: Physician Assistant

## 2014-02-26 ENCOUNTER — Encounter: Payer: Self-pay | Admitting: *Deleted

## 2014-02-26 ENCOUNTER — Ambulatory Visit (INDEPENDENT_AMBULATORY_CARE_PROVIDER_SITE_OTHER): Payer: Medicare Other | Admitting: Physician Assistant

## 2014-02-26 VITALS — BP 146/50 | HR 84 | Ht 69.0 in | Wt 196.6 lb

## 2014-02-26 DIAGNOSIS — R002 Palpitations: Secondary | ICD-10-CM

## 2014-02-26 DIAGNOSIS — Z720 Tobacco use: Secondary | ICD-10-CM

## 2014-02-26 DIAGNOSIS — F17200 Nicotine dependence, unspecified, uncomplicated: Secondary | ICD-10-CM

## 2014-02-26 NOTE — Assessment & Plan Note (Signed)
Patient has a significant AI murmur. Check 2-D echo

## 2014-02-26 NOTE — Assessment & Plan Note (Signed)
Patient has acute on chronic CHF. It's been since 2014 that an echo has been done. Will order 2-D echo to reassess his LV function. He also has stage IV kidney disease and is not urinating as much as he used to. Recommend follow-up with his renal doctor. He took them extra metolazone and is feeling better.

## 2014-02-26 NOTE — Progress Notes (Signed)
Cardiology Office Note   Date:  02/26/2014   ID:  [name], DOB February 14, 1945, MRN [id number]  PCP:  Odette Fraction, MD  Cardiologist:  Dr. Bronson Ing  CC: shortness of breath and palpitations    History of Present Illness: [name] is a 69 y.o. male patient of Dr. Bronson Ing who has prior history of chronic systolic heart failure although echo in 2014 demonstrated normal LV systolic function EF 47% with mild LVH, moderate LV dilatation, grade 1 diastolic dysfunction, mild aortic stenosis and regurgitation. He also has hypertension, diabetes mellitus, obesity, sleep apnea, and chronic kidney disease stage IV followed by nephrology. No ischemia on stress testing in 2010.  Patient was hospitalized in November 2015 with chest pain and shortness of breath deemed secondary to acute on chronic diastolic heart failure with accelerated hypertension due to missed medications. Patient was most recently seen by Dr.Koneswaran  01/22/14 at which time amlodipine was added for better blood pressure control.  Patient comes in today with 3 day history of palpitations and worsening dyspnea. He took a metolazone this morning and feels a little better. He says he woke up and his heart was just racing and skipping. He also had some mild tightness associated with it. He smokes a pack of cigarettes daily and is diabetic.    Past Medical History  Diagnosis Date  . Congestive heart failure 10/2008    preserved LV systolic function   . Aortic insufficiency 09/2008    Normal left ventricular size and function : graded as mild by Doppler , but pulse pressure is widened  . 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 Hypertension   . 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 Erectile dysfunction   . Colonic polyp   . Low serum testosterone level   . Tobacco abuse   . Sleep apnea 2010    Initially unable to afford  CPAP  . Benign prostatic hypertrophy   . Obesity, Class I, BMI 30-34.9   . Hyperlipidemia   . Congestive heart failure (CHF)     EF 25-30% 07/2011  . Adenomatous colon polyp 2006  . H. pylori infection 2013    treated with prevpac    Past Surgical History  Procedure Laterality Date  . Retinal laser procedure      diabetic retinopathy  . Lymph node biopsy      surgical exploration of neck-not entirely clear that this represented a lymph node biopsy  . Colonoscopy  08/23/2004    Dr. Vivi Ferns rectum, diminutive polyp of the rectosigmoid removed, inflamed focally adenomatous polyp     Current Outpatient Prescriptions  Medication Sig Dispense Refill  . amLODipine (NORVASC) 5 MG tablet Take 1 tablet (5 mg total) by mouth daily. 90 tablet 3  . aspirin EC 81 MG tablet Take 81 mg by mouth daily.    . cloNIDine (CATAPRES) 0.3 MG tablet Take 1 tablet by mouth 2 (two) times daily.    . Colchicine 0.6 MG CAPS Take 0.6 mg by mouth daily.    . febuxostat (ULORIC) 40 MG tablet Take 1 tablet (40 mg total) by mouth daily. 30 tablet 2  . hydrALAZINE (APRESOLINE) 100 MG tablet Take 100 mg by mouth 2 (two) times daily.    . Insulin Glargine (LANTUS SOLOSTAR) 100 UNIT/ML Solostar Pen Inject 10 Units into the skin daily at 10 pm. (  Patient taking differently: Inject 15-18 Units into the skin daily at 10 pm. ) 5 pen PRN  . metolazone (ZAROXOLYN) 2.5 MG tablet Take 2.5 mg by mouth daily as needed (for swelling/shortness of breath).    . metoprolol succinate (TOPROL-XL) 25 MG 24 hr tablet Take 1 tablet (25 mg total) by mouth daily. 30 tablet 6  . ONE TOUCH ULTRA TEST test strip     . potassium chloride SA (K-DUR,KLOR-CON) 20 MEQ tablet Take 1 tablet (20 mEq total) by mouth daily. (Patient taking differently: Take 20 mEq by mouth 2 (two) times daily. ) 7 tablet 0  . torsemide (DEMADEX) 100 MG tablet Take 100 mg by mouth 2 (two) times daily.      No current facility-administered medications for this visit.      Allergies:   Ace inhibitors    Social History:  The patient  reports that he has been smoking Cigarettes.  He started smoking about 55 years ago. He has a 11.25 pack-year smoking history. He uses smokeless tobacco. He reports that he drinks about 2.4 oz of alcohol per week. He reports that he does not use illicit drugs.   Family History:  The patient'sfamily history includes Cancer in his father and mother; Diabetes in his sister; Hypertension in his mother.    ROS:  Please see the history of present illness.   All other systems are reviewed and negative.    PHYSICAL EXAM: VS:  BP 146/50 mmHg  Pulse 84  Ht 5\' 9"  (1.753 m)  Wt 196 lb 9.6 oz (89.177 kg)  BMI 29.02 kg/m2 , BMI Body mass index is 29.02 kg/(m^2). GEN: Well nourished, well developed, in no acute distress HEENT: normal Neck: Slight increase JVD, no carotid bruits, or masses Cardiac: RRR; 2 to 3/6 diastolic murmur at the left sternal border and apex, 2/6 systolic murmur at the left sternal border, no rubs, or gallops, +1 edema bilaterally lower extremities  Respiratory: Decreased breath sounds throughout with crackles at the bases  GI: soft, nontender, nondistended, + BS MS: no deformity or atrophy Skin: warm and dry, no rash Neuro:  Strength and sensation are intact Psych: euthymic mood, full affect   EKG:  EKG is  ordered today. The ekg ordered today demonstrates normal sinus rhythm with frequent PVCs, left bundle branch block nonspecific ST-T wave changes, no acute change Recent Labs: 12/01/2013: Pro B Natriuretic peptide (BNP) 37333.0* 01/22/2014: B Natriuretic Peptide >4500.0*; Platelets 225 01/30/2014: ALT 11; BUN 72*; Creatinine 3.33*; Potassium 3.5; Sodium 140 02/11/2014: Hemoglobin 12.4*    Lipid Panel    Component Value Date/Time   CHOL 158 01/30/2014 0833   TRIG 55 01/30/2014 0833   HDL 71 01/30/2014 0833   CHOLHDL 2.2 01/30/2014 0833   VLDL 11 01/30/2014 0833   LDLCALC 76 01/30/2014 0833       Wt Readings from Last 3 Encounters:  02/26/14 196 lb 9.6 oz (89.177 kg)  01/30/14 200 lb (90.719 kg)  01/22/14 197 lb (89.359 kg)        Signed, Ermalinda Barrios, PA-C  02/26/2014 1:40 PM    [facility] [address], [place], [place]  [address] Phone: [phone]; Fax: [phone]

## 2014-02-26 NOTE — Assessment & Plan Note (Signed)
Patient awakened with frequent palpitations associated with increased dyspnea. He does have evidence of heart failure on exam today. It did improve with taking metolazone. He is having frequent PVCs. We'll check bmet. Consider monitor if it doesn't settle down.

## 2014-02-26 NOTE — Patient Instructions (Signed)
Your physician recommends that you schedule a follow-up appointment in: 2 weeks with Dr. Bronson Ing  Your physician recommends that you continue on your current medications as directed. Please refer to the Current Medication list given to you today.  Your physician has requested that you have an echocardiogram. Echocardiography is a painless test that uses sound waves to create images of your heart. It provides your doctor with information about the size and shape of your heart and how well your heart's chambers and valves are working. This procedure takes approximately one hour. There are no restrictions for this procedure.  Your physician has requested that you have en exercise stress myoview. For further information please visit [email or web address]. Please follow instruction sheet, as given.  Your physician recommends that you return for lab work in:  Today Artist)   Thank you for choosing [facility]!

## 2014-02-26 NOTE — Assessment & Plan Note (Signed)
Blood pressure is on the high side today. Recommend 2 g sodium diet.

## 2014-02-26 NOTE — Assessment & Plan Note (Signed)
Smoking cessation discussed 

## 2014-02-26 NOTE — Assessment & Plan Note (Signed)
Patient has had some chest tightness with the palpitations. He is a diabetic, hypertensive and smokes a pack of cigarettes a day. It's been 6 years since he's been evaluated with a stress test. Check stress Myoview. Follow-up with Dr. Bronson Ing in 2 weeks.

## 2014-02-27 ENCOUNTER — Telehealth: Payer: Self-pay | Admitting: *Deleted

## 2014-02-27 ENCOUNTER — Other Ambulatory Visit: Payer: Self-pay | Admitting: Physician Assistant

## 2014-02-27 DIAGNOSIS — E876 Hypokalemia: Secondary | ICD-10-CM | POA: Diagnosis not present

## 2014-02-27 DIAGNOSIS — R002 Palpitations: Secondary | ICD-10-CM | POA: Diagnosis not present

## 2014-02-27 LAB — BASIC METABOLIC PANEL
BUN: 84 mg/dL — ABNORMAL HIGH (ref 6–23)
CHLORIDE: 97 meq/L (ref 96–112)
CO2: 27 mEq/L (ref 19–32)
CREATININE: 4.05 mg/dL — AB (ref 0.50–1.35)
Calcium: 9.4 mg/dL (ref 8.4–10.5)
Glucose, Bld: 197 mg/dL — ABNORMAL HIGH (ref 70–99)
Potassium: 3 mEq/L — ABNORMAL LOW (ref 3.5–5.3)
SODIUM: 138 meq/L (ref 135–145)

## 2014-02-27 NOTE — Telephone Encounter (Signed)
Attempted to call patient. Unable to reach. Unable to leave a message.

## 2014-02-27 NOTE — Telephone Encounter (Signed)
-----   Message from Herminio Commons, MD sent at 02/27/2014  4:35 PM EST ----- Regarding: RE: Potassium 3.0  Take 40 meq KCl x 3 days and repeat BMET.  ----- Message -----    From: Levonne Hubert, LPN    Sent: 4/58/5929   4:14 PM      To: Herminio Commons, MD Subject: Potassium 3.0                                  [facility] called and states patient's Potassium 3.0. Please advise.

## 2014-02-28 LAB — MAGNESIUM: MAGNESIUM: 1.6 mg/dL (ref 1.5–2.5)

## 2014-02-28 NOTE — Telephone Encounter (Signed)
-----   Message from Herminio Commons, MD sent at 02/28/2014 12:24 PM EST ----- After he has taken 40 meq x 3 days, he will need a repeat BMET.

## 2014-02-28 NOTE — Telephone Encounter (Signed)
Pt is taking KCL 20 meq 2 tabs daily for past year according to him. I spoke with K lawrence NP and recommends increase KCL to 60 meq for 3 days and then repeat BMET on Tuesday 2/16, magnesium level added  Wife and pt aware

## 2014-03-04 ENCOUNTER — Encounter (HOSPITAL_COMMUNITY)
Admission: RE | Admit: 2014-03-04 | Discharge: 2014-03-04 | Disposition: A | Discharge status: Home / Self Care | Payer: Medicare Other | Source: Ambulatory Visit | Attending: Nephrology | Admitting: Nephrology

## 2014-03-04 DIAGNOSIS — N184 Chronic kidney disease, stage 4 (severe): Secondary | ICD-10-CM | POA: Insufficient documentation

## 2014-03-04 DIAGNOSIS — D631 Anemia in chronic kidney disease: Secondary | ICD-10-CM | POA: Insufficient documentation

## 2014-03-04 LAB — FERRITIN: FERRITIN: 466 ng/mL — AB (ref 22–322)

## 2014-03-04 LAB — IRON AND TIBC
Iron: 101 ug/dL (ref 42–165)
Saturation Ratios: 40 % (ref 20–55)
TIBC: 250 ug/dL (ref 215–435)
UIBC: 149 ug/dL (ref 125–400)

## 2014-03-04 LAB — RENAL FUNCTION PANEL
ALBUMIN: 4 g/dL (ref 3.5–5.2)
Anion gap: 11 (ref 5–15)
BUN: 91 mg/dL — ABNORMAL HIGH (ref 6–23)
CO2: 26 mmol/L (ref 19–32)
CREATININE: 4.54 mg/dL — AB (ref 0.50–1.35)
Calcium: 8.8 mg/dL (ref 8.4–10.5)
Chloride: 100 mmol/L (ref 96–112)
GFR calc Af Amer: 14 mL/min — ABNORMAL LOW (ref >90)
GFR calc non Af Amer: 12 mL/min — ABNORMAL LOW (ref >90)
Glucose, Bld: 150 mg/dL — ABNORMAL HIGH (ref 70–99)
Phosphorus: 4.7 mg/dL — ABNORMAL HIGH (ref 2.3–4.6)
Potassium: 3.3 mmol/L — ABNORMAL LOW (ref 3.5–5.1)
Sodium: 137 mmol/L (ref 135–145)

## 2014-03-04 LAB — HEMOGLOBIN AND HEMATOCRIT, BLOOD
HCT: 37.3 % — ABNORMAL LOW (ref 39.0–52.0)
Hemoglobin: 12.7 g/dL — ABNORMAL LOW (ref 13.0–17.0)

## 2014-03-04 MED ORDER — DARBEPOETIN ALFA 100 MCG/0.5ML IJ SOSY: 100.0000 ug | PREFILLED_SYRINGE | Freq: Once | INTRAMUSCULAR | Status: DC

## 2014-03-05 NOTE — Progress Notes (Signed)
Results for RAVEN, HARMES (MRN [id number]) as of 03/05/2014 14:15  Ref. Range 03/04/2014 09:30  Chloride Latest Range: 96-112 mmol/L 100  CO2 Latest Range: 19-32 mmol/L 26  BUN Latest Range: 6-23 mg/dL 91 (H)  Creatinine Latest Range: 0.50-1.35 mg/dL 4.54 (H)  Calcium Latest Range: 8.4-10.5 mg/dL 8.8  GFR calc non Af Amer Latest Range: >90 mL/min 12 (L)  GFR calc Af Amer Latest Range: >90 mL/min 14 (L)  Glucose Latest Range: 70-99 mg/dL 150 (H)  Anion gap Latest Range: 5-15  11  Phosphorus Latest Range: 2.3-4.6 mg/dL 4.7 (H)  Albumin Latest Range: 3.5-5.2 g/dL 4.0  Iron Latest Range: 42-165 ug/dL 101  UIBC Latest Range: 125-400 ug/dL 149  TIBC Latest Range: 215-435 ug/dL 250  Saturation Ratios Latest Range: 20-55 % 40  Ferritin Latest Range: 22-322 ng/mL 466 (H)  Hemoglobin Latest Range: 13.0-17.0 g/dL 12.7 (L)  HCT Latest Range: 39.0-52.0 % 37.3 (L)

## 2014-03-07 ENCOUNTER — Encounter (HOSPITAL_COMMUNITY): Payer: Self-pay

## 2014-03-07 ENCOUNTER — Ambulatory Visit (HOSPITAL_COMMUNITY)
Admission: RE | Admit: 2014-03-07 | Discharge: 2014-03-07 | Disposition: A | Discharge status: Home / Self Care | Payer: Medicare Other | Source: Ambulatory Visit | Attending: Physician Assistant | Admitting: Physician Assistant

## 2014-03-07 ENCOUNTER — Encounter (HOSPITAL_COMMUNITY)
Admission: RE | Admit: 2014-03-07 | Discharge: 2014-03-07 | Disposition: A | Discharge status: Home / Self Care | Payer: Medicare Other | Source: Ambulatory Visit | Attending: Physician Assistant | Admitting: Physician Assistant

## 2014-03-07 DIAGNOSIS — I5022 Chronic systolic (congestive) heart failure: Secondary | ICD-10-CM | POA: Insufficient documentation

## 2014-03-07 DIAGNOSIS — R002 Palpitations: Secondary | ICD-10-CM

## 2014-03-07 DIAGNOSIS — N184 Chronic kidney disease, stage 4 (severe): Secondary | ICD-10-CM | POA: Insufficient documentation

## 2014-03-07 DIAGNOSIS — I10 Essential (primary) hypertension: Secondary | ICD-10-CM | POA: Insufficient documentation

## 2014-03-07 DIAGNOSIS — I509 Heart failure, unspecified: Secondary | ICD-10-CM | POA: Insufficient documentation

## 2014-03-07 DIAGNOSIS — G4730 Sleep apnea, unspecified: Secondary | ICD-10-CM | POA: Diagnosis not present

## 2014-03-07 DIAGNOSIS — R0600 Dyspnea, unspecified: Secondary | ICD-10-CM

## 2014-03-07 DIAGNOSIS — R0609 Other forms of dyspnea: Secondary | ICD-10-CM | POA: Insufficient documentation

## 2014-03-07 DIAGNOSIS — E119 Type 2 diabetes mellitus without complications: Secondary | ICD-10-CM | POA: Insufficient documentation

## 2014-03-07 DIAGNOSIS — Z720 Tobacco use: Secondary | ICD-10-CM | POA: Diagnosis not present

## 2014-03-07 MED ORDER — REGADENOSON 0.4 MG/5ML IV SOLN
INTRAVENOUS | Status: AC
  Administered 2014-03-07: 0.4 mg via INTRAVENOUS
  Filled 2014-03-07: qty 5

## 2014-03-07 MED ORDER — TECHNETIUM TC 99M SESTAMIBI GENERIC - CARDIOLITE
10.0000 | Freq: Once | INTRAVENOUS | Status: AC | PRN
  Administered 2014-03-07: 10 via INTRAVENOUS

## 2014-03-07 MED ORDER — SODIUM CHLORIDE 0.9 % IJ SOLN
INTRAMUSCULAR | Status: AC
  Administered 2014-03-07: 10 mL via INTRAVENOUS
  Filled 2014-03-07: qty 3

## 2014-03-07 MED ORDER — SODIUM CHLORIDE 0.9 % IJ SOLN
10.0000 mL | INTRAMUSCULAR | Status: DC | PRN
  Administered 2014-03-07: 10 mL via INTRAVENOUS
  Filled 2014-03-07: qty 10

## 2014-03-07 MED ORDER — TECHNETIUM TC 99M SESTAMIBI - CARDIOLITE
30.0000 | Freq: Once | INTRAVENOUS | Status: AC | PRN
  Administered 2014-03-07: 30 via INTRAVENOUS

## 2014-03-07 MED ORDER — REGADENOSON 0.4 MG/5ML IV SOLN
0.4000 mg | Freq: Once | INTRAVENOUS | Status: AC | PRN
  Administered 2014-03-07: 0.4 mg via INTRAVENOUS

## 2014-03-07 NOTE — Progress Notes (Signed)
*  PRELIMINARY RESULTS* Echocardiogram 2D Echocardiogram has been performed.  [name] 03/07/2014, 11:02 AM

## 2014-03-07 NOTE — Progress Notes (Signed)
Stress Lab Nurses Notes - [name] 03/07/2014 Reason for doing test: Dyspnea and Palpitations Type of test: Test Changed uable to reach THR .  Lexiscan Cardiolite given. Nurse performing test: Gerrit Halls, RN Nuclear Medicine Tech: Dyanne Carrel Echo Tech: Not Applicable MD performing test: Konewaran/K.Purcell Nails NP Family MD: [name] Test explained and consent signed: Yes.   IV started: Saline lock flushed, No redness or edema and Saline lock started in radiology Symptoms: Fatigue in legs while on TM. Lexiscan given had some stomach discomfort. Treatment/Intervention: None Reason test stopped: protocol completed After recovery IV was: Discontinued via X-ray tech and No redness or edema Patient to return to [facility]. Med at : 11:45 Patient discharged: Home [name] Condition upon discharge was: stable Comments: During test peak BP 200/57 & HR 115.  Recovery BP 175/55 & HR 99.  Symptoms resolved in recovery. [name] T

## 2014-03-13 ENCOUNTER — Ambulatory Visit (INDEPENDENT_AMBULATORY_CARE_PROVIDER_SITE_OTHER): Payer: Medicare Other | Admitting: Cardiovascular Disease

## 2014-03-13 ENCOUNTER — Other Ambulatory Visit: Payer: Self-pay | Admitting: Cardiovascular Disease

## 2014-03-13 ENCOUNTER — Encounter: Payer: Self-pay | Admitting: Cardiovascular Disease

## 2014-03-13 VITALS — BP 140/44 | HR 65 | Ht 69.0 in | Wt 196.2 lb

## 2014-03-13 DIAGNOSIS — I519 Heart disease, unspecified: Secondary | ICD-10-CM

## 2014-03-13 DIAGNOSIS — I340 Nonrheumatic mitral (valve) insufficiency: Secondary | ICD-10-CM

## 2014-03-13 DIAGNOSIS — E785 Hyperlipidemia, unspecified: Secondary | ICD-10-CM | POA: Diagnosis not present

## 2014-03-13 DIAGNOSIS — I429 Cardiomyopathy, unspecified: Secondary | ICD-10-CM

## 2014-03-13 DIAGNOSIS — I493 Ventricular premature depolarization: Secondary | ICD-10-CM

## 2014-03-13 DIAGNOSIS — I350 Nonrheumatic aortic (valve) stenosis: Secondary | ICD-10-CM | POA: Diagnosis not present

## 2014-03-13 DIAGNOSIS — I5022 Chronic systolic (congestive) heart failure: Secondary | ICD-10-CM

## 2014-03-13 DIAGNOSIS — Z716 Tobacco abuse counseling: Secondary | ICD-10-CM

## 2014-03-13 DIAGNOSIS — N184 Chronic kidney disease, stage 4 (severe): Secondary | ICD-10-CM

## 2014-03-13 MED ORDER — ISOSORBIDE DINITRATE 10 MG PO TABS: 10.0000 mg | ORAL_TABLET | Freq: Three times a day (TID) | ORAL | Status: DC

## 2014-03-13 MED ORDER — ATORVASTATIN CALCIUM 40 MG PO TABS: 40.0000 mg | ORAL_TABLET | Freq: Every day | ORAL | Status: DC

## 2014-03-13 MED ORDER — HYDRALAZINE HCL 50 MG PO TABS: 50.0000 mg | ORAL_TABLET | Freq: Two times a day (BID) | ORAL | Status: DC

## 2014-03-13 NOTE — Patient Instructions (Addendum)
Your physician recommends that you schedule a follow-up appointment in: 6 weeks with Dr Virgina Jock will be admitted the day before on Tuesday, March 1 st to telemetry.the hospital will contact that morning and tell you when to arrive at admissions  Your physician has requested that you have a cardiac catheterization. Cardiac catheterization is used to diagnose and/or treat various heart conditions. Doctors may recommend this procedure for a number of different reasons. The most common reason is to evaluate chest pain. Chest pain can be a symptom of coronary artery disease (CAD), and cardiac catheterization can show whether plaque is narrowing or blocking your heart's arteries. This procedure is also used to evaluate the valves, as well as measure the blood flow and oxygen levels in different parts of your heart. For further information please visit [email or web address]. Please follow instruction sheet, as given.   Take your evening Torsemide at 4 PM NOT 6 PM  DECREASE Hydralazine to 50 mg twice aday  START Isordil 10 mg three times a day   START Lipitor 40 mg at dinner time  Your physician has recommended that you wear a holter monitor for 48. Holter monitors are medical devices that record the heart's electrical activity. Doctors most often use these monitors to diagnose arrhythmias. Arrhythmias are problems with the speed or rhythm of the heartbeat. The monitor is a small, portable device. You can wear one while you do your normal daily activities. This is usually used to diagnose what is causing palpitations/syncope (passing out).  We are referring your to our Electrophysiologists in [place] after your heart catherization    Thank you for choosing [facility] !

## 2014-03-13 NOTE — Progress Notes (Signed)
Patient ID: [name], male   DOB: [birth year], 69 y.o.   MRN: [id number]      SUBJECTIVE: I last saw the patient on 01/22/14 at which time he was relatively stable. He was evaluated again in the clinic by Ermalinda Barrios PA-C on 2/10 and was complaining of frequent palpitations and increasing dyspnea relieved with metolazone. She ordered a battery of tests for further evaluation including a basic metabolic panel, echocardiogram, and nuclear stress test given his valvular heart disease and cardiovascular risk factors. BUN 84, SCr 4.05. He has an old left bundle branch block Nuclear stress testing on 2/19 demonstrated a moderate degree of inferior wall scar with severe global hypokinesis and inferior wall akinesis, Graded LVEF 20%. There were no large ischemic zones. Echocardiography demonstrated a marked decline in left ventricular systolic function. There was mild left ventricular dilatation, moderate LVH, and severely reduced left ventricular systolic function, estimated LVEF 20-25%. Grade 2 diastolic dysfunction was seen along with mild aortic regurgitation and mild aortic stenosis with moderate mitral regurgitation and severe left atrial dilatation. Right ventricular systolic function was mildly reduced with mild right atrial dilatation, a PFO, and mild tricuspid regurgitation. This was a marked change from the echocardiogram performed on 07/16/12. ECG earlier this month demonstrated frequent PVCs. Today he feels well and denies chest pain, palpitations, and shortness of breath. He said he has increased palpitations when smoking cigarettes which he has significantly reduced. He also denies syncope. He sleeps about 2 hours per night as he has frequent urination and takes torsemide 100 mg twice daily. He also complains of worsening gout of the left knee since starting hydralazine. He believes he underwent coronary angiography decades ago. He does not remember ever undergoing percutaneous coronary  intervention.   Review of Systems: As per "subjective", otherwise negative.  Allergies  Allergen Reactions  . Ace Inhibitors Cough    REACTION: cough    Current Outpatient Prescriptions  Medication Sig Dispense Refill  . amLODipine (NORVASC) 5 MG tablet Take 1 tablet (5 mg total) by mouth daily. 90 tablet 3  . aspirin EC 81 MG tablet Take 81 mg by mouth daily.    . cloNIDine (CATAPRES) 0.3 MG tablet Take 1 tablet by mouth 2 (two) times daily.    . Colchicine 0.6 MG CAPS Take 0.6 mg by mouth daily.    . febuxostat (ULORIC) 40 MG tablet Take 1 tablet (40 mg total) by mouth daily. 30 tablet 2  . hydrALAZINE (APRESOLINE) 100 MG tablet Take 100 mg by mouth 2 (two) times daily.    . Insulin Glargine (LANTUS SOLOSTAR) 100 UNIT/ML Solostar Pen Inject 10 Units into the skin daily at 10 pm. (Patient taking differently: Inject 15-18 Units into the skin daily at 10 pm. ) 5 pen PRN  . metolazone (ZAROXOLYN) 2.5 MG tablet Take 2.5 mg by mouth daily as needed (for swelling/shortness of breath).    . metoprolol succinate (TOPROL-XL) 25 MG 24 hr tablet Take 1 tablet (25 mg total) by mouth daily. 30 tablet 6  . ONE TOUCH ULTRA TEST test strip     . potassium chloride SA (K-DUR,KLOR-CON) 20 MEQ tablet Take 1 tablet (20 mEq total) by mouth daily. (Patient taking differently: Take 20 mEq by mouth 2 (two) times daily. ) 7 tablet 0  . torsemide (DEMADEX) 100 MG tablet Take 100 mg by mouth 2 (two) times daily.      No current facility-administered medications for this visit.    Past Medical History  Diagnosis  Date  . Congestive heart failure 10/2008    preserved LV systolic function   . Aortic insufficiency 09/2008    Normal left ventricular size and function : graded as mild by Doppler , but pulse pressure is widened  . 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 Hypertension   . Chronic kidney disease (CKD), stage IV (severe) 12/2009    Creatinine of 2.2 in 3/11 and 2.7 in 8/12    . Erectile dysfunction   . Colonic polyp   . Low serum testosterone level   . Tobacco abuse   . Sleep apnea 2010    Initially unable to afford CPAP  . Benign prostatic hypertrophy   . Obesity, Class I, BMI 30-34.9   . Hyperlipidemia   . Congestive heart failure (CHF)     EF 25-30% 07/2011  . Adenomatous colon polyp 2006  . H. pylori infection 2013    treated with prevpac    Past Surgical History  Procedure Laterality Date  . Retinal laser procedure      diabetic retinopathy  . Lymph node biopsy      surgical exploration of neck-not entirely clear that this represented a lymph node biopsy  . Colonoscopy  08/23/2004    Dr. Vivi Ferns rectum, diminutive polyp of the rectosigmoid removed, inflamed focally adenomatous polyp    History   Social History  . Marital Status: Married    Spouse Name: N/A  . Number of Children: 4  . Years of Education: N/A   Occupational History  . Semi - Retired Administrator    Social History Main Topics  . Smoking status: Current Every Day Smoker -- 0.25 packs/day for 45 years    Types: Cigarettes    Start date: 05/31/1958  . Smokeless tobacco: Current User     Comment: 1-2 cigarettes OF THE NEW ELECTRIC CIGARETTES  . Alcohol Use: 2.4 oz/week    2 Glasses of wine, 2 Shots of liquor per week     Comment: occ  . Drug Use: No  . Sexual Activity: Not on file   Other Topics Concern  . Not on file   Social History Narrative     Filed Vitals:   03/13/14 0811  BP: 140/44  Pulse: 65  Height: 5\' 9"  (1.753 m)  Weight: 196 lb 3.2 oz (88.996 kg)  SpO2: 97%    PHYSICAL EXAM General: NAD HEENT: Normal. Neck: No JVD, no thyromegaly. Lungs: Faint bibasilar dry crackles, no wheezes. CV: Regular rate and rhythm, normal S1/S2, no S3/S4, 2/6 ejections systolic murmur over RUSB and 2/4 holodiastolic murmur over RUSB and along lower left sternal border. No pretibial or periankle edema. No carotid bruit.  Abdomen: Soft, nontender, no  hepatosplenomegaly, no distention.  Neurologic: Alert and oriented x 3.  Psych: Normal affect. Skin: Normal. Musculoskeletal: Normal range of motion, no gross deformities. Extremities: No clubbing or cyanosis.   ECG: Most recent ECG reviewed.      ASSESSMENT AND PLAN: 1. Chronic systolic heart failure with severely reduced LV systolic function, EF 69-48%: Euvolemic today, but with significant decline in LV systolic function which had normalized in 06/2012. It has been decades since his last catheterization. There was no ischemia seen on stress testing. That being said, my hypothesis is that he has had accelerated coronary artery stenosis leading to this decline in LV function due to ongoing tobacco abuse and stage IV CKD. I feel it is imperative we understand what his  coronary anatomy is and for this reason, I will arrange for right and left heart catheterization with coronary angiography. Given his severe CKD, he will need inpatient nephrology consultation as he is at an extremely high risk for the development of contrast-induced nephropathy and I am not certain much can be done to ameliorate that risk, other than minimizing contrast exposure. Continue torsemide 100 mg bid (evening dose at 4 pm to help with sleep) and metolazone prn. He complains of worsening gout from hydralazine, so I am compelled to decrease the dose to 50 mg bid. He is not a candidate for spironolactone or ACEI/ARB due to severe CKD. I will add isosorbide dinitrate 10 mg tid. Continue ASA and Toprol-XL. He is a candidate for high intensity statin therapy for which I will start Lipitor 40 mg daily, and check lipids and LFT's in 6 weeks. LFT's were normal in 1/16. He will also need EP consultation for ICD consideration. 2. Essential HTN: Borderline elevated today. As I am reducing hydralazine, this will need continued monitoring. 3. Valvular heart disease with moderate mitral regurgitation and mild aortic stenosis and  regurgitation: Symptomatically stable. Medication changes noted above. 4. CKD stage 4: Stable. Followed by nephrology. Will need inpatient consultation for cardiac catheterization, as he is at high risk for contrast-induced nephropathy and subsequent need for dialysis. 5. Anemia secondary to CKD: Hgb 12.7 on 2/16. Receives EPO injections. 6. Tobacco abuse: Cessation counseling provided, and he appears motivated to quit. 7. Palpitations with frequent PVC's and severely reduced LV systolic function: I am ordering a 48-hour Holter monitor to evaluate for NSVT. He had frequent PVC's earlier this month. I am not inclined to increase the metoprolol dose given his resting HR of 65 bpm. I will have EP evaluate him for ICD consideration.  Dispo: f/u 6 weeks.   Time spent: 40 minutes, of which greater than 50% was spent reviewing symptoms, relevant blood tests and studies, and discussing management plan with the patient.   Kate Sable, M.D., F.A.C.C.

## 2014-03-18 ENCOUNTER — Observation Stay (HOSPITAL_COMMUNITY)
Admission: RE | Admit: 2014-03-18 | Discharge: 2014-03-20 | Disposition: A | Discharge status: Home / Self Care | Payer: Medicare Other | Source: Ambulatory Visit | Attending: Cardiovascular Disease | Admitting: Cardiovascular Disease

## 2014-03-18 ENCOUNTER — Telehealth: Payer: Self-pay | Admitting: *Deleted

## 2014-03-18 ENCOUNTER — Ambulatory Visit: Payer: Medicare Other | Admitting: Cardiovascular Disease

## 2014-03-18 DIAGNOSIS — R0609 Other forms of dyspnea: Secondary | ICD-10-CM | POA: Diagnosis not present

## 2014-03-18 DIAGNOSIS — I429 Cardiomyopathy, unspecified: Secondary | ICD-10-CM | POA: Diagnosis not present

## 2014-03-18 DIAGNOSIS — R002 Palpitations: Secondary | ICD-10-CM | POA: Diagnosis not present

## 2014-03-18 DIAGNOSIS — I350 Nonrheumatic aortic (valve) stenosis: Secondary | ICD-10-CM | POA: Diagnosis not present

## 2014-03-18 DIAGNOSIS — N184 Chronic kidney disease, stage 4 (severe): Secondary | ICD-10-CM | POA: Diagnosis present

## 2014-03-18 DIAGNOSIS — Z7982 Long term (current) use of aspirin: Secondary | ICD-10-CM | POA: Diagnosis not present

## 2014-03-18 DIAGNOSIS — E11319 Type 2 diabetes mellitus with unspecified diabetic retinopathy without macular edema: Secondary | ICD-10-CM | POA: Diagnosis not present

## 2014-03-18 DIAGNOSIS — I519 Heart disease, unspecified: Secondary | ICD-10-CM

## 2014-03-18 DIAGNOSIS — I5022 Chronic systolic (congestive) heart failure: Secondary | ICD-10-CM | POA: Diagnosis present

## 2014-03-18 DIAGNOSIS — I10 Essential (primary) hypertension: Secondary | ICD-10-CM | POA: Diagnosis present

## 2014-03-18 DIAGNOSIS — J449 Chronic obstructive pulmonary disease, unspecified: Secondary | ICD-10-CM | POA: Diagnosis not present

## 2014-03-18 DIAGNOSIS — I340 Nonrheumatic mitral (valve) insufficiency: Secondary | ICD-10-CM | POA: Diagnosis not present

## 2014-03-18 DIAGNOSIS — D631 Anemia in chronic kidney disease: Secondary | ICD-10-CM | POA: Insufficient documentation

## 2014-03-18 DIAGNOSIS — F17210 Nicotine dependence, cigarettes, uncomplicated: Secondary | ICD-10-CM | POA: Diagnosis not present

## 2014-03-18 DIAGNOSIS — Z794 Long term (current) use of insulin: Secondary | ICD-10-CM | POA: Diagnosis not present

## 2014-03-18 DIAGNOSIS — D508 Other iron deficiency anemias: Secondary | ICD-10-CM

## 2014-03-18 DIAGNOSIS — F17200 Nicotine dependence, unspecified, uncomplicated: Secondary | ICD-10-CM | POA: Diagnosis present

## 2014-03-18 DIAGNOSIS — I428 Other cardiomyopathies: Secondary | ICD-10-CM

## 2014-03-18 DIAGNOSIS — I151 Hypertension secondary to other renal disorders: Secondary | ICD-10-CM | POA: Diagnosis not present

## 2014-03-18 DIAGNOSIS — N2889 Other specified disorders of kidney and ureter: Secondary | ICD-10-CM

## 2014-03-18 DIAGNOSIS — I129 Hypertensive chronic kidney disease with stage 1 through stage 4 chronic kidney disease, or unspecified chronic kidney disease: Secondary | ICD-10-CM | POA: Insufficient documentation

## 2014-03-18 DIAGNOSIS — Z720 Tobacco use: Secondary | ICD-10-CM

## 2014-03-18 DIAGNOSIS — I359 Nonrheumatic aortic valve disorder, unspecified: Secondary | ICD-10-CM | POA: Diagnosis present

## 2014-03-18 HISTORY — DX: Nonrheumatic aortic (valve) stenosis: I35.0

## 2014-03-18 HISTORY — DX: Chronic combined systolic (congestive) and diastolic (congestive) heart failure: I50.42

## 2014-03-18 HISTORY — DX: Other cardiomyopathies: I42.8

## 2014-03-18 LAB — CBC
HCT: 29.2 % — ABNORMAL LOW (ref 39.0–52.0)
Hemoglobin: 9.9 g/dL — ABNORMAL LOW (ref 13.0–17.0)
MCH: 29.3 pg (ref 26.0–34.0)
MCHC: 33.9 g/dL (ref 30.0–36.0)
MCV: 86.4 fL (ref 78.0–100.0)
PLATELETS: 130 10*3/uL — AB (ref 150–400)
RBC: 3.38 MIL/uL — ABNORMAL LOW (ref 4.22–5.81)
RDW: 15 % (ref 11.5–15.5)
WBC: 5 10*3/uL (ref 4.0–10.5)

## 2014-03-18 LAB — BASIC METABOLIC PANEL
Anion gap: 10 (ref 5–15)
BUN: 79 mg/dL — ABNORMAL HIGH (ref 6–23)
CALCIUM: 8.5 mg/dL (ref 8.4–10.5)
CO2: 27 mmol/L (ref 19–32)
Chloride: 98 mmol/L (ref 96–112)
Creatinine, Ser: 4.16 mg/dL — ABNORMAL HIGH (ref 0.50–1.35)
GFR calc Af Amer: 15 mL/min — ABNORMAL LOW (ref >90)
GFR calc non Af Amer: 13 mL/min — ABNORMAL LOW (ref >90)
GLUCOSE: 226 mg/dL — AB (ref 70–99)
Potassium: 3.1 mmol/L — ABNORMAL LOW (ref 3.5–5.1)
Sodium: 135 mmol/L (ref 135–145)

## 2014-03-18 LAB — GLUCOSE, CAPILLARY: Glucose-Capillary: 144 mg/dL — ABNORMAL HIGH (ref 70–99)

## 2014-03-18 LAB — PROTIME-INR
INR: 1.02 (ref 0.00–1.49)
Prothrombin Time: 13.5 seconds (ref 11.6–15.2)

## 2014-03-18 MED ORDER — ASPIRIN EC 81 MG PO TBEC
81.0000 mg | DELAYED_RELEASE_TABLET | Freq: Every day | ORAL | Status: DC
  Administered 2014-03-20: 81 mg via ORAL
  Filled 2014-03-18 (×2): qty 1

## 2014-03-18 MED ORDER — POTASSIUM CHLORIDE 20 MEQ PO PACK
40.0000 meq | PACK | Freq: Once | ORAL | Status: DC
  Filled 2014-03-18: qty 2

## 2014-03-18 MED ORDER — ATORVASTATIN CALCIUM 40 MG PO TABS
40.0000 mg | ORAL_TABLET | Freq: Every day | ORAL | Status: DC
  Administered 2014-03-19: 40 mg via ORAL
  Filled 2014-03-18 (×2): qty 1

## 2014-03-18 MED ORDER — ISOSORBIDE DINITRATE 10 MG PO TABS
10.0000 mg | ORAL_TABLET | Freq: Three times a day (TID) | ORAL | Status: DC
  Administered 2014-03-18 – 2014-03-20 (×5): 10 mg via ORAL
  Filled 2014-03-18 (×7): qty 1

## 2014-03-18 MED ORDER — SODIUM BICARBONATE BOLUS VIA INFUSION
INTRAVENOUS | Status: AC
  Administered 2014-03-19: 06:00:00 via INTRAVENOUS
  Filled 2014-03-18: qty 1

## 2014-03-18 MED ORDER — INSULIN GLARGINE 100 UNIT/ML ~~LOC~~ SOLN
5.0000 [IU] | Freq: Every day | SUBCUTANEOUS | Status: DC
  Administered 2014-03-18 – 2014-03-19 (×2): 5 [IU] via SUBCUTANEOUS
  Filled 2014-03-18 (×3): qty 0.05

## 2014-03-18 MED ORDER — TORSEMIDE 20 MG PO TABS
50.0000 mg | ORAL_TABLET | Freq: Every day | ORAL | Status: DC
  Administered 2014-03-18 – 2014-03-20 (×3): 50 mg via ORAL
  Filled 2014-03-18 (×3): qty 1

## 2014-03-18 MED ORDER — POTASSIUM CHLORIDE CRYS ER 20 MEQ PO TBCR
20.0000 meq | EXTENDED_RELEASE_TABLET | Freq: Every day | ORAL | Status: DC
  Administered 2014-03-19 – 2014-03-20 (×2): 20 meq via ORAL
  Filled 2014-03-18 (×3): qty 1

## 2014-03-18 MED ORDER — SODIUM CHLORIDE 0.9 % IJ SOLN: 3.0000 mL | INTRAMUSCULAR | Status: DC | PRN

## 2014-03-18 MED ORDER — INSULIN ASPART 100 UNIT/ML ~~LOC~~ SOLN: 0.0000 [IU] | Freq: Three times a day (TID) | SUBCUTANEOUS | Status: DC

## 2014-03-18 MED ORDER — SODIUM CHLORIDE 0.9 % IJ SOLN
3.0000 mL | Freq: Two times a day (BID) | INTRAMUSCULAR | Status: DC
  Administered 2014-03-18 – 2014-03-20 (×3): 3 mL via INTRAVENOUS

## 2014-03-18 MED ORDER — FEBUXOSTAT 40 MG PO TABS
40.0000 mg | ORAL_TABLET | Freq: Every day | ORAL | Status: DC
  Administered 2014-03-19: 40 mg via ORAL
  Filled 2014-03-18 (×2): qty 1

## 2014-03-18 MED ORDER — AMLODIPINE BESYLATE 5 MG PO TABS
5.0000 mg | ORAL_TABLET | Freq: Every day | ORAL | Status: DC
  Filled 2014-03-18: qty 1

## 2014-03-18 MED ORDER — POTASSIUM CHLORIDE CRYS ER 20 MEQ PO TBCR
40.0000 meq | EXTENDED_RELEASE_TABLET | Freq: Once | ORAL | Status: AC
  Administered 2014-03-18: 40 meq via ORAL
  Filled 2014-03-18: qty 2

## 2014-03-18 MED ORDER — SODIUM CHLORIDE 0.9 % IV SOLN
INTRAVENOUS | Status: DC
  Administered 2014-03-18: via INTRAVENOUS

## 2014-03-18 MED ORDER — SODIUM CHLORIDE 0.9 % IV SOLN: 250.0000 mL | INTRAVENOUS | Status: DC | PRN

## 2014-03-18 MED ORDER — HEPARIN SODIUM (PORCINE) 5000 UNIT/ML IJ SOLN
5000.0000 [IU] | Freq: Three times a day (TID) | INTRAMUSCULAR | Status: DC
  Administered 2014-03-18 – 2014-03-20 (×4): 5000 [IU] via SUBCUTANEOUS
  Filled 2014-03-18 (×7): qty 1

## 2014-03-18 MED ORDER — CLONIDINE HCL 0.3 MG PO TABS
0.3000 mg | ORAL_TABLET | Freq: Two times a day (BID) | ORAL | Status: DC
  Administered 2014-03-18 – 2014-03-20 (×4): 0.3 mg via ORAL
  Filled 2014-03-18 (×5): qty 1

## 2014-03-18 MED ORDER — ASPIRIN 81 MG PO CHEW
81.0000 mg | CHEWABLE_TABLET | ORAL | Status: AC
  Administered 2014-03-19: 81 mg via ORAL
  Filled 2014-03-18: qty 1

## 2014-03-18 MED ORDER — METOPROLOL SUCCINATE ER 25 MG PO TB24
25.0000 mg | ORAL_TABLET | Freq: Every day | ORAL | Status: DC
  Administered 2014-03-19 – 2014-03-20 (×2): 25 mg via ORAL
  Filled 2014-03-18 (×2): qty 1

## 2014-03-18 MED ORDER — SODIUM BICARBONATE 8.4 % IV SOLN
INTRAVENOUS | Status: AC
  Filled 2014-03-18: qty 1000

## 2014-03-18 MED ORDER — HYDRALAZINE HCL 50 MG PO TABS
50.0000 mg | ORAL_TABLET | Freq: Two times a day (BID) | ORAL | Status: DC
  Administered 2014-03-18 – 2014-03-20 (×4): 50 mg via ORAL
  Filled 2014-03-18 (×5): qty 1

## 2014-03-18 NOTE — Telephone Encounter (Signed)
Told pt that census in full and they will call pt per crystal at [facility].Wife aware

## 2014-03-18 NOTE — H&P (Signed)
Physician History and Physical    [name] MRN: [id number] DOB/AGE: Jun 05, 1945 69 y.o. Admit date: 03/18/2014  Primary Care Physician: Primary Cardiologist: Kate Sable, MD  HPI: The patient is a 69 yr old male whom I evaluated in the clinic on 2/22 and recommended coronary angiography given symptoms of palpitations, increasing frequency and severity of exertional dyspnea, and marked decline in LV systolic function, EF now 20%. He has CKD and I recommended he be admitted in order to have nephrology consultation beforehand, given his high risk for contrast-induced nephropathy and dialysis. I had informed Dr. Sherren Mocha of my plan, the interventionalist who will be performing the procedure on 3/2.  Summary: I saw the patient on 01/22/14 at which time he was relatively stable. He was evaluated again in the clinic by Ermalinda Barrios PA-C on 2/10 and was complaining of frequent palpitations and increasing dyspnea relieved with metolazone. She ordered a battery of tests for further evaluation including a basic metabolic panel, echocardiogram, and nuclear stress test given his valvular heart disease and cardiovascular risk factors. BUN 84, SCr 4.05. He has an old left bundle branch block Nuclear stress testing on 2/19 demonstrated a moderate degree of inferior wall scar with severe global hypokinesis and inferior wall akinesis, gated LVEF 20%. There were no large ischemic zones. Echocardiography demonstrated a marked decline in left ventricular systolic function. There was mild left ventricular dilatation, moderate LVH, and severely reduced left ventricular systolic function, estimated LVEF 20-25%. Grade 2 diastolic dysfunction was seen along with mild aortic regurgitation and mild aortic stenosis with moderate mitral regurgitation and severe left atrial dilatation. Right ventricular systolic function was mildly reduced with mild right atrial dilatation, a PFO, and mild tricuspid  regurgitation. This was a marked change from the echocardiogram performed on 07/16/12. ECG earlier this month demonstrated frequent PVCs.  At his visit on 2/22 he was doing well and denied chest pain, palpitations, and shortness of breath. He said he has increased palpitations when smoking cigarettes which he has significantly reduced. He also denies syncope. He sleeps about 2 hours per night as he has frequent urination and takes torsemide 100 mg twice daily. He also complained of worsening gout of the left knee since starting hydralazine. He believes he underwent coronary angiography decades ago. He does not remember ever undergoing percutaneous coronary intervention.  Review of systems complete and found to be negative unless listed above   No family history of premature CAD in 1st degree relatives. History   Social History  . Marital Status: Married    Spouse Name: N/A  . Number of Children: 4  . Years of Education: N/A   Occupational History  . Semi - Retired Administrator    Social History Main Topics  . Smoking status: Current Every Day Smoker -- 0.25 packs/day for 45 years    Types: Cigarettes    Start date: 05/31/1958  . Smokeless tobacco: Current User     Comment: 1-2 cigarettes OF THE NEW ELECTRIC CIGARETTES  . Alcohol Use: 2.4 oz/week    2 Glasses of wine, 2 Shots of liquor per week     Comment: occ  . Drug Use: No  . Sexual Activity: Not on file   Other Topics Concern  . Not on file   Social History Narrative     Prescriptions prior to admission  Medication Sig Dispense Refill Last Dose  . amLODipine (NORVASC) 5 MG tablet Take 1 tablet (5 mg total) by mouth daily. 90 tablet  3 Taking  . aspirin EC 81 MG tablet Take 81 mg by mouth daily.   Taking  . atorvastatin (LIPITOR) 40 MG tablet Take 1 tablet (40 mg total) by mouth daily. 90 tablet 3   . cloNIDine (CATAPRES) 0.3 MG tablet Take 0.3 mg by mouth 2 (two) times daily.    Taking  . Colchicine 0.6 MG CAPS Take  0.6 mg by mouth daily as needed (gout flare ups). Colcrys   Taking  . diphenhydramine-acetaminophen (TYLENOL PM) 25-500 MG TABS Take 1 tablet by mouth at bedtime as needed (sleep/ pain).     . febuxostat (ULORIC) 40 MG tablet Take 1 tablet (40 mg total) by mouth daily. 30 tablet 2 Taking  . hydrALAZINE (APRESOLINE) 50 MG tablet Take 1 tablet (50 mg total) by mouth 2 (two) times daily. 180 tablet 3   . Insulin Glargine (LANTUS SOLOSTAR) 100 UNIT/ML Solostar Pen Inject 10 Units into the skin daily at 10 pm. (Patient taking differently: Inject 10-15 Units into the skin at bedtime. ) 5 pen PRN Taking  . isosorbide dinitrate (ISORDIL) 10 MG tablet Take 1 tablet (10 mg total) by mouth 3 (three) times daily. 90 tablet 3   . metolazone (ZAROXOLYN) 2.5 MG tablet Take 2.5 mg by mouth daily as needed (for swelling/shortness of breath).   Taking  . metoprolol succinate (TOPROL-XL) 25 MG 24 hr tablet Take 1 tablet (25 mg total) by mouth daily. 30 tablet 6 Taking  . naproxen sodium (ANAPROX) 220 MG tablet Take 220 mg by mouth daily as needed (pain). Aleve     . potassium chloride SA (K-DUR,KLOR-CON) 20 MEQ tablet Take 1 tablet (20 mEq total) by mouth daily. (Patient taking differently: Take 20 mEq by mouth 3 (three) times daily. ) 7 tablet 0 Taking  . torsemide (DEMADEX) 100 MG tablet Take 100 mg by mouth 2 (two) times daily.    Taking  . ONE TOUCH ULTRA TEST test strip    Taking    Physical Exam: Blood pressure 136/41, pulse 61, temperature 98.4 F (36.9 C), temperature source Oral, resp. rate 20, height 5\' 9"  (1.753 m), weight 197 lb 11.2 oz (89.676 kg), SpO2 100 %.  General: NAD HEENT: Normal. Neck: No JVD, no thyromegaly. Lungs: Faint bibasilar dry crackles, no wheezes. CV: Regular rate and rhythm, normal S1/S2, no S3/S4, 2/6 ejections systolic murmur over RUSB and 2/4 holodiastolic murmur over RUSB and along lower left sternal border. No pretibial or periankle edema. No carotid bruit.  Abdomen: Soft,  nontender, no hepatosplenomegaly, no distention.  Neurologic: Alert and oriented x 3.  Psych: Normal affect. Skin: Normal. Musculoskeletal: Normal range of motion, no gross deformities. Extremities: No clubbing or cyanosis.   Labs:   Lab Results  Component Value Date   WBC 5.0 03/18/2014   HGB 9.9* 03/18/2014   HCT 29.2* 03/18/2014   MCV 86.4 03/18/2014   PLT 130* 03/18/2014    Recent Labs Lab 03/18/14 1940  NA 135  K 3.1*  CL 98  CO2 27  BUN 79*  CREATININE 4.16*  CALCIUM 8.5  GLUCOSE 226*   Lab Results  Component Value Date   CKTOTAL 254* 07/20/2011   CKMB 3.3 07/20/2011   TROPONINI 0.08* 01/23/2014    Lab Results  Component Value Date   CHOL 158 01/30/2014   CHOL 140 07/09/2013   CHOL 171 05/01/2011   Lab Results  Component Value Date   HDL 71 01/30/2014   HDL 63 07/09/2013   HDL 57 05/01/2011  Lab Results  Component Value Date   LDLCALC 76 01/30/2014   LDLCALC 65 07/09/2013   LDLCALC 96 05/01/2011   Lab Results  Component Value Date   TRIG 55 01/30/2014   TRIG 58 07/09/2013   TRIG 90 05/01/2011   Lab Results  Component Value Date   CHOLHDL 2.2 01/30/2014   CHOLHDL 2.2 07/09/2013   CHOLHDL 3.0 05/01/2011   No results found for: LDLDIRECT        ASSESSMENT AND PLAN:  1. Chronic systolic heart failure with severely reduced LV systolic function, EF 55-37%: Remains euvolemic today, but with significant decline in LV systolic function which had normalized in 06/2012. It has been decades since his last catheterization. There was no ischemia seen on stress testing. That being said, my hypothesis is that he has had accelerated coronary artery stenosis leading to this decline in LV function due to ongoing tobacco abuse and stage IV CKD. I feel it is imperative we understand what his coronary anatomy is and for this reason, I have arranged for right and left heart catheterization with coronary angiography. Given his severe CKD, he will need inpatient  nephrology consultation as he is at an extremely high risk for the development of contrast-induced nephropathy and I am not certain much can be done to ameliorate that risk, other than minimizing contrast exposure. Continue torsemide 100 mg bid (evening dose at 4 pm to help with sleep) and metolazone prn. Because of complaints at the office visit on 2/22 of worsening gout from hydralazine, I decreased the dose to 50 mg bid. He is not a candidate for spironolactone or ACEI/ARB due to severe CKD. I recently added isosorbide dinitrate 10 mg tid. Continue ASA and Toprol-XL. I also started Lipitor 40 mg daily, and plan to check lipids and in 6 weeks. LFT's were normal in 1/16 and as per FDA recommendations, there is no indication to continue to follow these. He will also need EP consultation for ICD consideration.  2. Essential HTN: Normal today. As I recently reduced hydralazine, this will need continued monitoring.  3. Valvular heart disease with moderate mitral regurgitation and mild aortic stenosis and regurgitation: Symptomatically stable. Medication changes noted above.  4. CKD stage 4: Stable. Followed by nephrology as an outpatient. I have placed an inpatient nephrology consult for cardiac catheterization, as he is at high risk for contrast-induced nephropathy and subsequent need for dialysis.  5. Anemia secondary to CKD: Hgb 9.9, was 12.7 on 2/16. Receives EPO injections.  6. Tobacco abuse: Cessation counseling provided, and he appears motivated to quit.  7. Palpitations with frequent PVC's and severely reduced LV systolic function: I have already ordered a 48-hour Holter monitor to evaluate for NSVT. He had frequent PVC's earlier this month. I am not inclined to increase the metoprolol dose given his resting HR of 61 bpm. I will have EP evaluate him for ICD consideration.   Signed: Kate Sable, M.D., F.A.C.C. 03/18/2014, 9:27 PM

## 2014-03-18 NOTE — Progress Notes (Signed)
Pt arrive as a direct admit to the unit at 1825. Pt arrived to the unit via wheelchair with spouse and belongings to the side. Pt A&O x4; pt oriented to the unit and room; telemetry applied and verified; vitals taken, pain assessed; pt denies any pain; 2new IV sites inserted; no pressure ulcer noted upon admission; Dr. Burt Knack paged and notified of pt arrival but MD returns call and said he don't know of this patient and he's not on-call; therefore should contact on-call MD to clarify pt attending MD. Dr. Jacinta Shoe paged with no response; MD text paged again and notified of pt arrival; awaiting on orders for pt. Reported off to incoming RN. Francis Gaines Casandra Dallaire RN.

## 2014-03-18 NOTE — Progress Notes (Signed)
Pharmacy consulted for sodium bicarbonate for prevention of CIN.  Patient is 69 year old male with severe CKD (current renal function 16.41mL/min) and MD aware.  Sodium Bicarbonate Orders for Prevention of Contrast-Induced Nephropathy (CIN) order set entered to start 1 hr prior to cath.  Orders in place.  Pharmacy will sign off.   Sloan Leiter, PharmD, BCPS Clinical Pharmacist [phone] 03/18/2014, 7:10 PM

## 2014-03-18 NOTE — Telephone Encounter (Signed)
Per pt he was told someone would call him this am about cath for tomorrow and he still has not got call.

## 2014-03-19 ENCOUNTER — Encounter (HOSPITAL_COMMUNITY): Payer: Self-pay | Admitting: General Practice

## 2014-03-19 ENCOUNTER — Encounter (HOSPITAL_COMMUNITY)
Admission: RE | Disposition: A | Discharge status: Home / Self Care | Payer: Self-pay | Source: Ambulatory Visit | Attending: Cardiovascular Disease

## 2014-03-19 DIAGNOSIS — I509 Heart failure, unspecified: Secondary | ICD-10-CM | POA: Diagnosis not present

## 2014-03-19 DIAGNOSIS — R002 Palpitations: Secondary | ICD-10-CM | POA: Diagnosis not present

## 2014-03-19 DIAGNOSIS — I359 Nonrheumatic aortic valve disorder, unspecified: Secondary | ICD-10-CM | POA: Diagnosis not present

## 2014-03-19 DIAGNOSIS — I429 Cardiomyopathy, unspecified: Secondary | ICD-10-CM | POA: Insufficient documentation

## 2014-03-19 DIAGNOSIS — F17210 Nicotine dependence, cigarettes, uncomplicated: Secondary | ICD-10-CM | POA: Diagnosis not present

## 2014-03-19 DIAGNOSIS — E11319 Type 2 diabetes mellitus with unspecified diabetic retinopathy without macular edema: Secondary | ICD-10-CM | POA: Diagnosis not present

## 2014-03-19 DIAGNOSIS — N184 Chronic kidney disease, stage 4 (severe): Secondary | ICD-10-CM | POA: Diagnosis not present

## 2014-03-19 DIAGNOSIS — N179 Acute kidney failure, unspecified: Secondary | ICD-10-CM | POA: Diagnosis not present

## 2014-03-19 DIAGNOSIS — I350 Nonrheumatic aortic (valve) stenosis: Secondary | ICD-10-CM | POA: Diagnosis not present

## 2014-03-19 DIAGNOSIS — E876 Hypokalemia: Secondary | ICD-10-CM | POA: Diagnosis not present

## 2014-03-19 DIAGNOSIS — J449 Chronic obstructive pulmonary disease, unspecified: Secondary | ICD-10-CM | POA: Diagnosis not present

## 2014-03-19 DIAGNOSIS — Z7982 Long term (current) use of aspirin: Secondary | ICD-10-CM | POA: Diagnosis not present

## 2014-03-19 DIAGNOSIS — Z794 Long term (current) use of insulin: Secondary | ICD-10-CM | POA: Diagnosis not present

## 2014-03-19 DIAGNOSIS — I340 Nonrheumatic mitral (valve) insufficiency: Secondary | ICD-10-CM | POA: Diagnosis not present

## 2014-03-19 DIAGNOSIS — D631 Anemia in chronic kidney disease: Secondary | ICD-10-CM | POA: Diagnosis not present

## 2014-03-19 DIAGNOSIS — I129 Hypertensive chronic kidney disease with stage 1 through stage 4 chronic kidney disease, or unspecified chronic kidney disease: Secondary | ICD-10-CM | POA: Diagnosis not present

## 2014-03-19 DIAGNOSIS — I5022 Chronic systolic (congestive) heart failure: Secondary | ICD-10-CM | POA: Diagnosis not present

## 2014-03-19 HISTORY — PX: LEFT AND RIGHT HEART CATHETERIZATION WITH CORONARY ANGIOGRAM: SHX5449

## 2014-03-19 LAB — BASIC METABOLIC PANEL
Anion gap: 9 (ref 5–15)
BUN: 81 mg/dL — ABNORMAL HIGH (ref 6–23)
CHLORIDE: 103 mmol/L (ref 96–112)
CO2: 26 mmol/L (ref 19–32)
CREATININE: 4.17 mg/dL — AB (ref 0.50–1.35)
Calcium: 8.7 mg/dL (ref 8.4–10.5)
GFR calc non Af Amer: 13 mL/min — ABNORMAL LOW (ref >90)
GFR, EST AFRICAN AMERICAN: 15 mL/min — AB (ref >90)
Glucose, Bld: 88 mg/dL (ref 70–99)
POTASSIUM: 2.9 mmol/L — AB (ref 3.5–5.1)
SODIUM: 138 mmol/L (ref 135–145)

## 2014-03-19 LAB — POCT I-STAT 3, VENOUS BLOOD GAS (G3P V)
Acid-Base Excess: 1 mmol/L (ref 0.0–2.0)
Bicarbonate: 26.6 mEq/L — ABNORMAL HIGH (ref 20.0–24.0)
O2 Saturation: 75 %
PCO2 VEN: 43.2 mmHg — AB (ref 45.0–50.0)
TCO2: 28 mmol/L (ref 0–100)
pH, Ven: 7.398 — ABNORMAL HIGH (ref 7.250–7.300)
pO2, Ven: 40 mmHg (ref 30.0–45.0)

## 2014-03-19 LAB — POCT I-STAT 3, ART BLOOD GAS (G3+)
ACID-BASE EXCESS: 2 mmol/L (ref 0.0–2.0)
Bicarbonate: 27.2 mEq/L — ABNORMAL HIGH (ref 20.0–24.0)
O2 Saturation: 99 %
PH ART: 7.416 (ref 7.350–7.450)
TCO2: 28 mmol/L (ref 0–100)
pCO2 arterial: 42.3 mmHg (ref 35.0–45.0)
pO2, Arterial: 119 mmHg — ABNORMAL HIGH (ref 80.0–100.0)

## 2014-03-19 LAB — GLUCOSE, CAPILLARY
Glucose-Capillary: 86 mg/dL (ref 70–99)
Glucose-Capillary: 134 mg/dL — ABNORMAL HIGH (ref 70–99)

## 2014-03-19 SURGERY — LEFT AND RIGHT HEART CATHETERIZATION WITH CORONARY ANGIOGRAM
Anesthesia: LOCAL

## 2014-03-19 MED ORDER — LABETALOL HCL 5 MG/ML IV SOLN
INTRAVENOUS | Status: AC
  Filled 2014-03-19: qty 4

## 2014-03-19 MED ORDER — POTASSIUM CHLORIDE CRYS ER 20 MEQ PO TBCR
20.0000 meq | EXTENDED_RELEASE_TABLET | Freq: Once | ORAL | Status: AC
  Administered 2014-03-19: 20 meq via ORAL

## 2014-03-19 MED ORDER — MIDAZOLAM HCL 2 MG/2ML IJ SOLN
INTRAMUSCULAR | Status: AC
Start: 2014-03-19 — End: 2014-03-19
  Filled 2014-03-19: qty 2

## 2014-03-19 MED ORDER — HYDRALAZINE HCL 20 MG/ML IJ SOLN: 10.0000 mg | INTRAMUSCULAR | Status: DC | PRN

## 2014-03-19 MED ORDER — ACETAMINOPHEN 325 MG PO TABS: 650.0000 mg | ORAL_TABLET | ORAL | Status: DC | PRN

## 2014-03-19 MED ORDER — AMLODIPINE BESYLATE 10 MG PO TABS: 10.0000 mg | ORAL_TABLET | Freq: Every day | ORAL | Status: DC

## 2014-03-19 MED ORDER — FENTANYL CITRATE 0.05 MG/ML IJ SOLN
INTRAMUSCULAR | Status: AC
  Filled 2014-03-19: qty 2

## 2014-03-19 MED ORDER — SODIUM CHLORIDE 0.9 % IV SOLN
INTRAVENOUS | Status: DC
  Administered 2014-03-19: 11:00:00 via INTRAVENOUS

## 2014-03-19 MED ORDER — HEPARIN (PORCINE) IN NACL 2-0.9 UNIT/ML-% IJ SOLN
INTRAMUSCULAR | Status: AC
  Filled 2014-03-19: qty 1000

## 2014-03-19 MED ORDER — ONDANSETRON HCL 4 MG/2ML IJ SOLN: 4.0000 mg | Freq: Four times a day (QID) | INTRAMUSCULAR | Status: DC | PRN

## 2014-03-19 MED ORDER — AMLODIPINE BESYLATE 5 MG PO TABS
5.0000 mg | ORAL_TABLET | Freq: Every day | ORAL | Status: DC
  Administered 2014-03-19 – 2014-03-20 (×2): 5 mg via ORAL
  Filled 2014-03-19 (×2): qty 1

## 2014-03-19 MED ORDER — LIDOCAINE HCL (PF) 1 % IJ SOLN
INTRAMUSCULAR | Status: AC
  Filled 2014-03-19: qty 30

## 2014-03-19 MED ORDER — LABETALOL HCL 5 MG/ML IV SOLN
20.0000 mg | INTRAVENOUS | Status: DC | PRN
Start: 2014-03-19 — End: 2014-03-20
  Administered 2014-03-19: 20 mg via INTRAVENOUS
  Filled 2014-03-19 (×2): qty 4

## 2014-03-19 NOTE — Progress Notes (Signed)
Reviewed note of Dr Bronson Ing. Discussed plan for cath with patient and wife. They understand issues around his advanced kidney disease. He is followed by Dr Mercy Moore. AV fistula placement has been recommended but the patient has declined this. They understand risk of cath/contrast administration and possibility of progression to ESRD with the procedure. Will await nephrology consultation.   Will start normal saline, await consultation, plan R/L limited dye study from RFA access to preserve upper extremity access.  Sherren Mocha 03/19/2014 7:40 AM

## 2014-03-19 NOTE — Progress Notes (Signed)
SUBJECTIVE:  No complaints  OBJECTIVE:   Vitals:   Filed Vitals:   03/18/14 1829 03/18/14 2030 03/19/14 0546  BP: 138/46 136/41 151/41  Pulse: 62 61 60  Temp: 97.5 F (36.4 C) 98.4 F (36.9 C) 98.4 F (36.9 C)  TempSrc: Oral Oral Oral  Resp: 18 20 18   Height: 5\' 9"  (1.753 m)    Weight: 197 lb 11.2 oz (89.676 kg)    SpO2: 95% 100% 99%   I&O's:  No intake or output data in the 24 hours ending 03/19/14 0902 TELEMETRY: Reviewed telemetry pt in NSR:     PHYSICAL EXAM General: Well developed, well nourished, in no acute distress Head: Eyes PERRLA, No xanthomas.   Normal cephalic and atramatic  Lungs:   Clear bilaterally to auscultation and percussion. Heart:   HRRR S1 S2 Pulses are 2+ & equal. 2/6 diastolic murmur at RUSB to LLSB Abdomen: Bowel sounds are positive, abdomen soft and non-tender without massesExtremities:   No clubbing, cyanosis or edema.  DP +1 Neuro: Alert and oriented X 3. Psych:  Good affect, responds appropriately   LABS: Basic Metabolic Panel:  Recent Labs  03/18/14 1940 03/19/14 0449  NA 135 138  K 3.1* 2.9*  CL 98 103  CO2 27 26  GLUCOSE 226* 88  BUN 79* 81*  CREATININE 4.16* 4.17*  CALCIUM 8.5 8.7   Liver Function Tests: No results for input(s): AST, ALT, ALKPHOS, BILITOT, PROT, ALBUMIN in the last 72 hours. No results for input(s): LIPASE, AMYLASE in the last 72 hours. CBC:  Recent Labs  03/18/14 1940  WBC 5.0  HGB 9.9*  HCT 29.2*  MCV 86.4  PLT 130*   Cardiac Enzymes: No results for input(s): CKTOTAL, CKMB, CKMBINDEX, TROPONINI in the last 72 hours. BNP: Invalid input(s): POCBNP D-Dimer: No results for input(s): DDIMER in the last 72 hours. Hemoglobin A1C: No results for input(s): HGBA1C in the last 72 hours. Fasting Lipid Panel: No results for input(s): CHOL, HDL, LDLCALC, TRIG, CHOLHDL, LDLDIRECT in the last 72 hours. Thyroid Function Tests: No results for input(s): TSH, T4TOTAL, T3FREE, THYROIDAB in the last 72  hours.  Invalid input(s): FREET3 Anemia Panel: No results for input(s): VITAMINB12, FOLATE, FERRITIN, TIBC, IRON, RETICCTPCT in the last 72 hours. Coag Panel:   Lab Results  Component Value Date   INR 1.02 03/18/2014   INR 1.09 11/18/2008    RADIOLOGY: Nm Myocar Multi W/spect W/wall Motion / Ef  03/07/2014   CLINICAL DATA:  69 year old patient with chronic systolic heart failure, hypertension, diabetes, sleep apnea, and stage 4 chronic kidney disease with worsening exertional dyspnea and referred for an ischemic evaluation.  EXAM: MYOCARDIAL IMAGING WITH SPECT (REST AND PHARMACOLOGIC-STRESS)  GATED LEFT VENTRICULAR WALL MOTION STUDY  LEFT VENTRICULAR EJECTION FRACTION  TECHNIQUE: Standard myocardial SPECT imaging was performed after resting intravenous injection of 10 mCi Tc-63m sestamibi. Subsequently, intravenous infusion of Lexiscan was performed under the supervision of the Cardiology staff. At peak effect of the drug, 30 mCi Tc-31m sestamibi was injected intravenously and standard myocardial SPECT imaging was performed. Quantitative gated imaging was also performed to evaluate left ventricular wall motion, and estimate left ventricular ejection fraction.  COMPARISON:  None.  FINDINGS: Stress/ECG data: The patient was initially stressed with the Bruce protocol but was unable to achieve an adequate heart rate and less it was switched to a Lexiscan. The heart rate ranged from 75 to 120 beats per min. The blood pressure ranged from 170/53 up to 200/57.  Resting ECG  showed sinus rhythm with a left bundle branch block which persisted throughout the study. PVCs were noted in recovery. This is a nondiagnostic ECG.  Perfusion: There is a moderate sized area of mildly decreased uptake in the inferior wall extending from the apex to the base. Images are worse on rest than stress. While this is suggestive of inferior wall scar, overlying soft tissue attenuation cannot entirely be ruled out.  Wall Motion:  Severe global hypokinesis with the inferior wall more akinetic. Left ventricle is dilated at stress and rest.  Left Ventricular Ejection Fraction: 20 %  End diastolic volume 485 ml  End systolic volume 462 ml  IMPRESSION: 1. Moderate degree of inferior wall scar. However, overlying soft tissue attenuation cannot entirely be ruled out.  2. Severe global hypokinesis with the inferior wall more akinetic.  3. Left ventricular ejection fraction 20%  4. High-risk stress test findings* primarily based on severe left ventricular dysfunction.  *2012 Appropriate Use Criteria for Coronary Revascularization Focused Update: J Am Coll Cardiol. 7035;00(9):381-829. http://[email or web address]?articleid=[email or web address]   Electronically Signed   By: Kate Sable   On: 03/07/2014 19:50    ASSESSMENT AND PLAN: 1. Chronic systolic heart failure with severely reduced LV systolic function, EF 93-71%: Euvolemic today, but with significant decline in LV systolic function which had normalized in 06/2012. It has been decades since his last catheterization. There was no ischemia seen on stress testing. Most likely he has had accelerated coronary artery stenosis leading to this decline in LV function due to ongoing tobacco abuse and stage IV CKD. I feel it is imperative we understand what his coronary anatomy is and for this reason, He has been set up for right and left heart catheterization with coronary angiography pending nephrology consult. Given his severe CKD, he will need inpatient nephrology consultation as he is at an extremely high risk for the development of contrast-induced nephropathy and I am not certain much can be done to ameliorate that risk, other than minimizing contrast exposure. Continue torsemide 100 mg bid (evening dose at 4 pm to help with sleep) and metolazone prn. He complains of worsening gout from hydralazine, so  dose has been decreased to 50 mg bid. He is not a candidate for spironolactone or  ACEI/ARB due to severe CKD. Continue isosorbide dinitrate 10 mg tid. Continue ASA and Toprol-XL. Continue  Lipitor 40 mg daily, and check lipids and LFT's in 6 weeks. LFT's were normal in 1/16.   2. Essential HTN: Borderline control.  Increase amlodipine to 10mg  daily if BP trends upward after decreasing hydralazine  3. Valvular heart disease with moderate mitral regurgitation and mild aortic stenosis and regurgitation: Symptomatically stable.   4. CKD stage 4: Stable. Followed by nephrology. Inpatient consultation pending for cardiac catheterization, as he is at high risk for contrast-induced nephropathy and subsequent need for dialysis.  5. Anemia secondary to CKD: Hgb 12.7 on 2/16. Receives EPO injections.  6. Tobacco abuse: Cessation counseling provided, and he appears motivated to quit.  7. Palpitations with frequent PVC's and severely reduced LV systolic function: Not inclined to increase the metoprolol dose given his resting HR of 65 bpm and may consider changing to Coreg. I will have EP evaluate him for ICD after cath but will need to be on maximum heart failure regimen for 2 months and recheck LVF before considering ICD.  Harold Margarita, MD  03/19/2014  9:02 AM

## 2014-03-19 NOTE — Progress Notes (Signed)
Site area:right groin a 5 french arterial and a 7 french venous sheath was removed  Site Prior to Removal:  Level 0  Pressure Applied For 20 MINUTES    Minutes Beginning at 1725  Manual:   Yes.    Patient Status During Pull:  stable  Post Pull Groin Site:  Level 0  Post Pull Instructions Given:  Yes.    Post Pull Pulses Present:  Yes.    Dressing Applied:  Yes.    Comments:  BP 194/64 to 187/67 Labetalol 10mg  IV given per MD order. Pt denies any discomfort at site

## 2014-03-19 NOTE — Consult Note (Signed)
Reason for Consult:Advanced CKD stage 4 prior to heart catheterization Referring Physician: Burt Knack, MD  [name] is an 69 y.o. male.  HPI: Pt is a 69yo AAM with an extensive PMH sig for DM, HTN, advanced CKD stage 4 (has refused vascular access placement as well as renal replacement therapy prior to this admission per notes from Dr. Mercy Moore), severe CMP, and CHF who was evaluated in the Cardiology clinic on 2/22.  It was recommended to undergo coronary angiography given symptoms of palpitations, increasing frequency and severity of exertional dyspnea, and marked decline in LV systolic function, EF now 20%. He was admitted in order to have nephrology consultation before catheterization due to his high risk for contrast-induced nephropathy and need for dialysis.  He has been reluctant to consider RRT and therefore the catheterization would be cancelled if he continued to refuse HD if needed.  His trend in Scr is seen below.  Trend in Creatinine:  CREATININE, SER  Date/Time Value Ref Range Status  03/19/2014 04:49 AM 4.17* 0.50 - 1.35 mg/dL Final  03/18/2014 07:40 PM 4.16* 0.50 - 1.35 mg/dL Final  03/04/2014 09:30 AM 4.54* 0.50 - 1.35 mg/dL Final  02/27/2014 08:41 AM 4.05* 0.50 - 1.35 mg/dL Final  01/30/2014 08:33 AM 3.33* 0.50 - 1.35 mg/dL Final  01/22/2014 11:02 PM 4.58* 0.50 - 1.35 mg/dL Final  12/02/2013 03:28 AM 4.43* 0.50 - 1.35 mg/dL Final  12/01/2013 08:23 AM 4.24* 0.50 - 1.35 mg/dL Final  11/03/2013 05:29 AM 4.18* 0.50 - 1.35 mg/dL Final  11/02/2013 08:47 PM 4.37* 0.50 - 1.35 mg/dL Final  10/28/2013 08:47 AM 4.59* 0.50 - 1.35 mg/dL Final  08/02/2013 08:53 AM 4.61* 0.50 - 1.35 mg/dL Final  07/09/2013 09:02 AM 5.09* 0.50 - 1.35 mg/dL Final  04/16/2013 08:09 AM 4.65* 0.50 - 1.35 mg/dL Final  06/15/2012 03:06 PM 3.84* 0.50 - 1.35 mg/dL Final  08/10/2011 11:43 AM 4.55* 0.50 - 1.35 mg/dL Final  07/21/2011 06:29 AM 3.58* 0.50 - 1.35 mg/dL Final  07/20/2011 03:16 AM 3.28* 0.50 - 1.35  mg/dL Final  07/19/2011 06:01 AM 3.25* 0.50 - 1.35 mg/dL Final  07/15/2011 10:50 AM 3.25* 0.50 - 1.35 mg/dL Final  03/17/2010 11:00 PM 2.29* 0.40-1.50 mg/dL Final  02/24/2010 10:36 PM 2.59* 0.40-1.50 mg/dL Final  02/24/2010 2.59 mg/dL   12/15/2009 01:14 PM 2.25 mg/dL   12/14/2009 2.25 mg/dL   10/19/2009 08:53 PM 2.51* 0.40-1.50 mg/dL Final  10/19/2009 2.51 mg/dL   04/20/2009 06:30 PM 2.28* 0.40-1.50 mg/dL Final  04/02/2009 06:37 PM 2.15* 0.40-1.50 mg/dL Final  03/26/2009 07:15 PM 3.02* 0.40-1.50 mg/dL Final  11/28/2008 2.01 mg/dL   11/28/2008 2.01 mg/dL   11/19/2008 05:08 AM 2.19* 0.4 - 1.5 mg/dL Final  11/18/2008 05:19 AM 2.48* 0.4 - 1.5 mg/dL Final  11/18/2008 05:18 AM 2.44* 0.4 - 1.5 mg/dL Final  11/17/2008 11:21 PM 2.60* 0.4 - 1.5 mg/dL Final  11/12/2008 01:15 AM 2.23* 0.4 - 1.5 mg/dL Final  11/10/2008 09:51 PM 2.48* 0.4 - 1.5 mg/dL Final  11/06/2008 12:00 AM 2.43* 0.40-1.50 mg/dL Final  10/03/2008 06:40 AM 2.35* 0.4 - 1.5 mg/dL Final  10/02/2008 05:00 AM 2.15* 0.4 - 1.5 mg/dL Final  10/01/2008 03:25 AM 2.20* 0.4 - 1.5 mg/dL Final  11/27/2007 01:23 AM 2.05* 0.40-1.50 mg/dL Final  10/15/2007 11:50 AM 1.68* 0.40-1.50 mg/dL Final  10/05/2007 08:23 PM 2.25* 0.40-1.50 mg/dL Final  08/22/2007 08:31 PM 1.56* 0.40-1.50 mg/dL Final  08/17/2007 09:46 PM 2.20* 0.40-1.50 mg/dL Final  02/24/2007 01:49 AM 1.36 0.40-1.50 mg/dL Final  PMH:   Past Medical History  Diagnosis Date  . Congestive heart failure 10/2008    preserved LV systolic function   . Aortic insufficiency 09/2008    Normal left ventricular size and function : graded as mild by Doppler , but pulse pressure is widened  . 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 Hypertension   . 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 Erectile dysfunction   . Colonic polyp   . Low serum testosterone level   . Tobacco abuse   . Sleep apnea 2010     Initially unable to afford CPAP  . Benign prostatic hypertrophy   . Obesity, Class I, BMI 30-34.9   . Hyperlipidemia   . Congestive heart failure (CHF)     EF 25-30% 07/2011  . Adenomatous colon polyp 2006  . H. pylori infection 2013    treated with prevpac    PSH:   Past Surgical History  Procedure Laterality Date  . Retinal laser procedure      diabetic retinopathy  . Lymph node biopsy      surgical exploration of neck-not entirely clear that this represented a lymph node biopsy  . Colonoscopy  08/23/2004    Dr. Vivi Ferns rectum, diminutive polyp of the rectosigmoid removed, inflamed focally adenomatous polyp    Allergies:  Allergies  Allergen Reactions  . Ace Inhibitors Cough    Medications:   Prior to Admission medications   Medication Sig Start Date End Date Taking? Authorizing Provider  amLODipine (NORVASC) 5 MG tablet Take 1 tablet (5 mg total) by mouth daily. 01/22/14  Yes Herminio Commons, MD  aspirin EC 81 MG tablet Take 81 mg by mouth daily.   Yes Historical Provider, MD  atorvastatin (LIPITOR) 40 MG tablet Take 1 tablet (40 mg total) by mouth daily. 03/13/14  Yes Herminio Commons, MD  cloNIDine (CATAPRES) 0.3 MG tablet Take 0.3 mg by mouth 2 (two) times daily.  11/28/13  Yes Historical Provider, MD  Colchicine 0.6 MG CAPS Take 0.6 mg by mouth daily as needed (gout flare ups). Colcrys   Yes Historical Provider, MD  diphenhydramine-acetaminophen (TYLENOL PM) 25-500 MG TABS Take 1 tablet by mouth at bedtime as needed (sleep/ pain).   Yes Historical Provider, MD  febuxostat (ULORIC) 40 MG tablet Take 1 tablet (40 mg total) by mouth daily. 04/11/13  Yes Alycia Rossetti, MD  hydrALAZINE (APRESOLINE) 50 MG tablet Take 1 tablet (50 mg total) by mouth 2 (two) times daily. 03/13/14  Yes Herminio Commons, MD  Insulin Glargine (LANTUS SOLOSTAR) 100 UNIT/ML Solostar Pen Inject 10 Units into the skin daily at 10 pm. Patient taking differently: Inject 10-15 Units into  the skin at bedtime.  07/11/13  Yes Alycia Rossetti, MD  isosorbide dinitrate (ISORDIL) 10 MG tablet Take 1 tablet (10 mg total) by mouth 3 (three) times daily. 03/13/14  Yes Herminio Commons, MD  metolazone (ZAROXOLYN) 2.5 MG tablet Take 2.5 mg by mouth daily as needed (for swelling/shortness of breath).   Yes Historical Provider, MD  metoprolol succinate (TOPROL-XL) 25 MG 24 hr tablet Take 1 tablet (25 mg total) by mouth daily. 02/13/14  Yes Lendon Colonel, NP  naproxen sodium (ANAPROX) 220 MG tablet Take 220 mg by mouth daily as needed (pain). Aleve   Yes Historical Provider, MD  potassium chloride SA (K-DUR,KLOR-CON) 20  MEQ tablet Take 1 tablet (20 mEq total) by mouth daily. Patient taking differently: Take 20 mEq by mouth 3 (three) times daily.  01/23/14  Yes Richarda Blade, MD  torsemide (DEMADEX) 100 MG tablet Take 100 mg by mouth 2 (two) times daily.    Yes Historical Provider, MD  ONE TOUCH ULTRA TEST test strip  01/15/14   Historical Provider, MD    Inpatient medications: . amLODipine  5 mg Oral Daily  . aspirin EC  81 mg Oral Daily  . atorvastatin  40 mg Oral Daily  . cloNIDine  0.3 mg Oral BID  . febuxostat  40 mg Oral Daily  . heparin  5,000 Units Subcutaneous 3 times per day  . hydrALAZINE  50 mg Oral BID  . insulin aspart  0-15 Units Subcutaneous TID WC  . insulin glargine  5 Units Subcutaneous QHS  . isosorbide dinitrate  10 mg Oral TID  . metoprolol succinate  25 mg Oral Daily  . potassium chloride SA  20 mEq Oral Daily  . sodium bicarbonate (CIN) infusion   Intravenous Pre-Cath  . sodium chloride  3 mL Intravenous Q12H  . torsemide  50 mg Oral Daily    Discontinued Meds:   Medications Discontinued During This Encounter  Medication Reason  . potassium chloride (KLOR-CON) packet 40 mEq Formulary change  . amLODipine (NORVASC) tablet 5 mg   . amLODipine (NORVASC) tablet 10 mg     Social History:  reports that he has been smoking Cigarettes.  He started smoking  about 55 years ago. He has a 11.25 pack-year smoking history. He uses smokeless tobacco. He reports that he drinks about 2.4 oz of alcohol per week. He reports that he does not use illicit drugs.  Family History:   Family History  Problem Relation Age of Onset  . Hypertension Mother   . Cancer Mother   . Cancer Father   . Diabetes Sister     A comprehensive review of systems was negative except for: Cardiovascular: positive for exertional chest pressure/discomfort, fatigue and worsening dyspnea on exertion Weight change:   Intake/Output Summary (Last 24 hours) at 03/19/14 1325 Last data filed at 03/19/14 1300  Gross per 24 hour  Intake    240 ml  Output      0 ml  Net    240 ml   BP 138/34 mmHg  Pulse 58  Temp(Src) 97.7 F (36.5 C) (Oral)  Resp 18  Ht 5\' 9"  (1.753 m)  Wt 89.676 kg (197 lb 11.2 oz)  BMI 29.18 kg/m2  SpO2 98% Filed Vitals:   03/18/14 2030 03/19/14 0546 03/19/14 1032 03/19/14 1320  BP: 136/41 151/41 160/45 138/34  Pulse: 61 60 61 58  Temp: 98.4 F (36.9 C) 98.4 F (36.9 C)  97.7 F (36.5 C)  TempSrc: Oral Oral  Oral  Resp: 20 18  18   Height:      Weight:      SpO2: 100% 99%  98%     General appearance: alert, cooperative and no distress Head: Normocephalic, without obvious abnormality, atraumatic Eyes: negative findings: lids and lashes normal, conjunctivae and sclerae normal and corneas clear Neck: no adenopathy, no carotid bruit, no JVD, supple, symmetrical, trachea midline and thyroid not enlarged, symmetric, no tenderness/mass/nodules Cardio: regular rate and rhythm and no rub GI: soft, non-tender; bowel sounds normal; no masses,  no organomegaly Extremities: edema minimal pretibial   Labs: Basic Metabolic Panel:  Recent Labs Lab 03/18/14 1940 03/19/14 0449  NA  135 138  K 3.1* 2.9*  CL 98 103  CO2 27 26  GLUCOSE 226* 88  BUN 79* 81*  CREATININE 4.16* 4.17*  CALCIUM 8.5 8.7   Liver Function Tests: No results for input(s): AST, ALT,  ALKPHOS, BILITOT, PROT, ALBUMIN in the last 168 hours. No results for input(s): LIPASE, AMYLASE in the last 168 hours. No results for input(s): AMMONIA in the last 168 hours. CBC:  Recent Labs Lab 03/18/14 1940  WBC 5.0  HGB 9.9*  HCT 29.2*  MCV 86.4  PLT 130*   PT/INR: @LABRCNTIP (inr:5) Cardiac Enzymes: )No results for input(s): CKTOTAL, CKMB, CKMBINDEX, TROPONINI in the last 168 hours. CBG:  Recent Labs Lab 03/18/14 2316 03/19/14 0609 03/19/14 1140  GLUCAP 144* 86 134*    Iron Studies: No results for input(s): IRON, TIBC, TRANSFERRIN, FERRITIN in the last 168 hours.  Xrays/Other Studies: No results found.   Assessment/Plan: 1.  CAD/worsening CMP- agree with Dr. Antionette Char assessment.  Pt with escalating symptoms and worsening of EF warrants further cardiac workup.  Given his advanced CKD he is at an increased risk for CIN and the potential need for dialysis.  After a lengthy discussion with he and his wife, he has agreed to proceed with the catheterization and would be willing to undergo RRT and vascular access placement if he required dialysis. 2. CKD stage 4- stable renal function but increased risk for CIN as above.  Will order vein mapping and consult VVS pending results of cardiac cath and effect on renal function.  Hold diuretics prior to cath.  Minimize contrast load. 3. Anemia of chronic disease- will check iron stores and initiate epo therapy 4. DM- per primary svc 5. Hypokalemia- replete and follow 6. SHPTH- will follow Ca/Phos/iPTH   Lolamae Voisin A 03/19/2014, 1:25 PM

## 2014-03-19 NOTE — CV Procedure (Signed)
    Cardiac Catheterization Procedure Note  Name: [name] MRN: [id number] DOB: [birth year]  Procedure: Right Heart Cath, Left Heart Cath, Selective Coronary Angiography  Indication: CHF, severe cardiomyopathy   Procedural Details: The right groin was prepped, draped, and anesthetized with 1% lidocaine. Using the modified Seldinger technique a 5 French sheath was placed in the right femoral artery and a 7 French sheath was placed in the right femoral vein. A Swan-Ganz catheter was used for the right heart catheterization. Standard protocol was followed for recording of right heart pressures and sampling of oxygen saturations. Fick cardiac output was calculated. Standard Judkins catheters were used for selective coronary angiography and left ventriculography. There were no immediate procedural complications. The patient was transferred to the post catheterization recovery area for further monitoring.  Procedural Findings: Hemodynamics RA mean 7 RV 53/8 PA 54/29 mean 41 PCWP a wave 23, v wave 28, mean 22 LV 182/32 AO 180/60 mean 108  Oxygen saturations: PA 75 AO 99  Cardiac Output (Fick) 8.5  Cardiac Index (Fick) 4.1   Coronary angiography: Coronary dominance: right  Left mainstem: Widely patent vessel with no obstruction.  Left anterior descending (LAD): The LAD is patent to the apex of the heart. The diagonal branch is widely patent. There are minor irregularities with no significant stenosis.  Left circumflex (LCx): The left circumflex is medium in caliber. The vessel is patent without stenosis.  Right coronary artery (RCA): Large, dominant vessel. There are minor irregularities. The vessel supplies a large PDA branch with no stenosis.  Left ventriculography: Deferred because of chronic kidney disease  Contrast: 20 cc Omnipaque  Estimated Blood Loss: Minimal  Final Conclusions:   1. Widely patent coronary arteries with minimal irregularity noted 2. Congestive heart  failure secondary to nonischemic cardiomyopathy 3. Hemodynamic findings demonstrating preserved cardiac output but elevated left and right heart filling pressures  Recommendations: Discussed with Dr Marval Regal. Keep x 48 hours to follow renal function. Med Rx for CHF. Will keep fluids KVO as hemodynamics suggest volume overload. Will need diuresis tomorrow if creatinine stable.   Sherren Mocha 03/19/2014, 5:15 PM

## 2014-03-19 NOTE — Progress Notes (Signed)
UR completed 

## 2014-03-19 NOTE — Interval H&P Note (Signed)
History and Physical Interval Note:  03/19/2014 4:42 PM  [name]  has presented today for surgery, with the diagnosis of severe cardiomyopathy  The various methods of treatment have been discussed with the patient and family. After consideration of risks, benefits and other options for treatment, the patient has consented to  Procedure(s): LEFT AND RIGHT HEART CATHETERIZATION WITH CORONARY ANGIOGRAM (N/A) as a surgical intervention .  The patient's history has been reviewed, patient examined, no change in status, stable for surgery.  I have reviewed the patient's chart and labs.  Questions were answered to the patient's satisfaction.    Cath Lab Visit (complete for each Cath Lab visit)  Clinical Evaluation Leading to the Procedure:   ACS: No.  Non-ACS:    Anginal Classification: CCS II  Anti-ischemic medical therapy: Maximal Therapy (2 or more classes of medications)  Non-Invasive Test Results: High-risk stress test findings: cardiac mortality >3%/year  Prior CABG: No previous CABG       [name]

## 2014-03-20 ENCOUNTER — Encounter (HOSPITAL_COMMUNITY): Payer: Self-pay | Admitting: Nurse Practitioner

## 2014-03-20 DIAGNOSIS — F17210 Nicotine dependence, cigarettes, uncomplicated: Secondary | ICD-10-CM | POA: Diagnosis not present

## 2014-03-20 DIAGNOSIS — I340 Nonrheumatic mitral (valve) insufficiency: Secondary | ICD-10-CM | POA: Diagnosis not present

## 2014-03-20 DIAGNOSIS — I428 Other cardiomyopathies: Secondary | ICD-10-CM

## 2014-03-20 DIAGNOSIS — Z7982 Long term (current) use of aspirin: Secondary | ICD-10-CM | POA: Diagnosis not present

## 2014-03-20 DIAGNOSIS — I420 Dilated cardiomyopathy: Secondary | ICD-10-CM | POA: Diagnosis not present

## 2014-03-20 DIAGNOSIS — I129 Hypertensive chronic kidney disease with stage 1 through stage 4 chronic kidney disease, or unspecified chronic kidney disease: Secondary | ICD-10-CM | POA: Diagnosis not present

## 2014-03-20 DIAGNOSIS — I5022 Chronic systolic (congestive) heart failure: Secondary | ICD-10-CM | POA: Diagnosis not present

## 2014-03-20 DIAGNOSIS — D631 Anemia in chronic kidney disease: Secondary | ICD-10-CM | POA: Diagnosis not present

## 2014-03-20 DIAGNOSIS — I252 Old myocardial infarction: Secondary | ICD-10-CM | POA: Diagnosis not present

## 2014-03-20 DIAGNOSIS — I10 Essential (primary) hypertension: Secondary | ICD-10-CM

## 2014-03-20 DIAGNOSIS — N184 Chronic kidney disease, stage 4 (severe): Secondary | ICD-10-CM | POA: Diagnosis not present

## 2014-03-20 DIAGNOSIS — J449 Chronic obstructive pulmonary disease, unspecified: Secondary | ICD-10-CM | POA: Diagnosis not present

## 2014-03-20 DIAGNOSIS — E876 Hypokalemia: Secondary | ICD-10-CM | POA: Diagnosis not present

## 2014-03-20 DIAGNOSIS — E11319 Type 2 diabetes mellitus with unspecified diabetic retinopathy without macular edema: Secondary | ICD-10-CM | POA: Diagnosis not present

## 2014-03-20 DIAGNOSIS — N179 Acute kidney failure, unspecified: Secondary | ICD-10-CM | POA: Diagnosis not present

## 2014-03-20 DIAGNOSIS — Z794 Long term (current) use of insulin: Secondary | ICD-10-CM | POA: Diagnosis not present

## 2014-03-20 DIAGNOSIS — R002 Palpitations: Secondary | ICD-10-CM | POA: Diagnosis not present

## 2014-03-20 DIAGNOSIS — I509 Heart failure, unspecified: Secondary | ICD-10-CM | POA: Diagnosis not present

## 2014-03-20 DIAGNOSIS — I429 Cardiomyopathy, unspecified: Secondary | ICD-10-CM | POA: Diagnosis not present

## 2014-03-20 DIAGNOSIS — I359 Nonrheumatic aortic valve disorder, unspecified: Secondary | ICD-10-CM

## 2014-03-20 DIAGNOSIS — I350 Nonrheumatic aortic (valve) stenosis: Secondary | ICD-10-CM | POA: Diagnosis not present

## 2014-03-20 LAB — CBC
HEMATOCRIT: 30.3 % — AB (ref 39.0–52.0)
Hemoglobin: 10.2 g/dL — ABNORMAL LOW (ref 13.0–17.0)
MCH: 28.5 pg (ref 26.0–34.0)
MCHC: 33.7 g/dL (ref 30.0–36.0)
MCV: 84.6 fL (ref 78.0–100.0)
Platelets: 148 10*3/uL — ABNORMAL LOW (ref 150–400)
RBC: 3.58 MIL/uL — AB (ref 4.22–5.81)
RDW: 14.9 % (ref 11.5–15.5)
WBC: 5.2 10*3/uL (ref 4.0–10.5)

## 2014-03-20 LAB — RENAL FUNCTION PANEL
ANION GAP: 10 (ref 5–15)
Albumin: 3.1 g/dL — ABNORMAL LOW (ref 3.5–5.2)
BUN: 77 mg/dL — ABNORMAL HIGH (ref 6–23)
CO2: 28 mmol/L (ref 19–32)
Calcium: 8.6 mg/dL (ref 8.4–10.5)
Chloride: 100 mmol/L (ref 96–112)
Creatinine, Ser: 3.97 mg/dL — ABNORMAL HIGH (ref 0.50–1.35)
GFR, EST AFRICAN AMERICAN: 16 mL/min — AB (ref >90)
GFR, EST NON AFRICAN AMERICAN: 14 mL/min — AB (ref >90)
Glucose, Bld: 125 mg/dL — ABNORMAL HIGH (ref 70–99)
PHOSPHORUS: 4 mg/dL (ref 2.3–4.6)
POTASSIUM: 3.1 mmol/L — AB (ref 3.5–5.1)
SODIUM: 138 mmol/L (ref 135–145)

## 2014-03-20 LAB — FERRITIN: FERRITIN: 466 ng/mL — AB (ref 22–322)

## 2014-03-20 LAB — GLUCOSE, CAPILLARY
GLUCOSE-CAPILLARY: 217 mg/dL — AB (ref 70–99)
Glucose-Capillary: 112 mg/dL — ABNORMAL HIGH (ref 70–99)
Glucose-Capillary: 120 mg/dL — ABNORMAL HIGH (ref 70–99)
Glucose-Capillary: 210 mg/dL — ABNORMAL HIGH (ref 70–99)
Glucose-Capillary: 175 mg/dL — ABNORMAL HIGH (ref 70–99)

## 2014-03-20 LAB — IRON AND TIBC
IRON: 90 ug/dL (ref 42–165)
Saturation Ratios: 44 % (ref 20–55)
TIBC: 204 ug/dL — AB (ref 215–435)
UIBC: 114 ug/dL — AB (ref 125–400)

## 2014-03-20 MED ORDER — AMLODIPINE BESYLATE 10 MG PO TABS: 10.0000 mg | ORAL_TABLET | Freq: Every day | ORAL | Status: DC

## 2014-03-20 MED ORDER — CARVEDILOL 6.25 MG PO TABS
6.2500 mg | ORAL_TABLET | Freq: Two times a day (BID) | ORAL | Status: DC
  Administered 2014-03-20: 6.25 mg via ORAL
  Filled 2014-03-20 (×2): qty 1

## 2014-03-20 MED ORDER — CARVEDILOL 6.25 MG PO TABS: 6.2500 mg | ORAL_TABLET | Freq: Two times a day (BID) | ORAL | Status: DC

## 2014-03-20 MED ORDER — POTASSIUM CHLORIDE CRYS ER 20 MEQ PO TBCR: 20.0000 meq | EXTENDED_RELEASE_TABLET | Freq: Two times a day (BID) | ORAL | Status: DC

## 2014-03-20 MED ORDER — METOLAZONE 2.5 MG PO TABS: ORAL_TABLET | ORAL | Status: DC

## 2014-03-20 MED ORDER — POTASSIUM CHLORIDE CRYS ER 20 MEQ PO TBCR: 20.0000 meq | EXTENDED_RELEASE_TABLET | Freq: Three times a day (TID) | ORAL | Status: DC

## 2014-03-20 MED ORDER — TORSEMIDE 100 MG PO TABS
100.0000 mg | ORAL_TABLET | Freq: Two times a day (BID) | ORAL | Status: DC
  Filled 2014-03-20: qty 1

## 2014-03-20 NOTE — Discharge Summary (Signed)
Discharge Summary   Patient ID: [name],  MRN: [id number], DOB/AGE: [id number] y.o.  Admit date: 03/18/2014 Discharge date: 03/20/2014  Primary Care Provider: [facility] Primary Cardiologist: Court Joy, MD   Discharge Diagnoses Principal Problem:   Nonischemic cardiomyopathy  **s/p catheterization this admission revealing relatively normal coronary arteries and elevated filling pressures.  Active Problems:   Chronic kidney disease, stage 4, severely decreased GFR   Chronic systolic congestive heart failure   Diabetes mellitus type 2 with retinopathy   TOBACCO ABUSE   Essential hypertension   Palpitations   Aortic valve disease   COPD (chronic obstructive pulmonary disease)  Allergies Allergies  Allergen Reactions  . Ace Inhibitors Cough   Procedures  Cardiac Catheterization 3.2.2016  Procedural Findings: Hemodynamics RA mean 7 RV 53/8 PA 54/29 mean 41 PCWP a wave 23, v wave 28, mean 22 LV 182/32 AO 180/60 mean 108  Oxygen saturations: PA 75 AO 99  Cardiac Output (Fick) 8.5  Cardiac Index (Fick) 4.1            Coronary angiography: Coronary dominance: right  Left mainstem: Widely patent vessel with no obstruction. Left anterior descending (LAD): The LAD is patent to the apex of the heart. The diagonal branch is widely patent. There are minor irregularities with no significant stenosis. Left circumflex (LCx): The left circumflex is medium in caliber. The vessel is patent without stenosis. Right coronary artery (RCA): Large, dominant vessel. There are minor irregularities. The vessel supplies a large PDA branch with no stenosis. Left ventriculography: Deferred because of chronic kidney disease _____________   History of Present Illness  69 year old male with a prior history of hypertension, hyperlipidemia, diabetes, stage IV chronic kidney disease, and diastolic heart failure. He was recently seen in clinic secondary to complaints of  palpitations along with increasing dyspnea requiring additional oral diuretics at home. Lab work revealed a serum creatinine of 4.05. Stress testing was undertaken and revealed a moderate degree of inferior wall scar with severe global hypokinesis and inferior wall akinesis. EF was found to be 20%. Follow-up echocardiogram confirmed LV dysfunction with an EF of 20-25% and grade 2 diastolic dysfunction. This was a new finding for him. At follow-up office visit, decision was made to pursue diagnostic catheterization. Because of his chronic kidney disease, decision was made to admit him one day early for nephrology evaluation.  Hospital Course  [facility] was [facility] to [name] on 03/18/2014.  Diuretics were held in preparation for catheterization. He was seen by nephrology with recommendation to minimize contrast load. Given low EF, he could not be aggressively hydrated. He underwent diagnostic cardiac catheterization on March 2 revealing relatively normal coronary arteries with elevated filling pressures including a pulmonary capillary wedge pressure of 22. This morning, his creatinine is stable at 3.97. He has been seen by nephrology with recommendation for resumption of his home dose of torsemide at 100 mg twice a day. They have also recommended metolazone 2.5 mg daily 3 days and then every Monday Wednesday Friday.  In the setting of severe LV dysfunction with symptomatic PVCs and concern for nonsustained ventricular tachycardia, patient has been maintained on beta blocker therapy and in fact, has been switched from metoprolol to carvedilol. We have also arranged for life vest placement. Provided that this can be placed today, we will plan for discharge today. We have arranged for follow-up basic metabolic panel and life vest interrogation for next week.  Discharge Vitals Blood pressure 154/41, pulse 65, temperature 98.5 F (36.9 C),  temperature source Oral, resp. rate 18, height 5\' 9"  (1.753 m),  weight 192 lb 3.9 oz (87.2 kg), SpO2 99 %.  Filed Weights   03/18/14 1829 03/20/14 0621  Weight: 197 lb 11.2 oz (89.676 kg) 192 lb 3.9 oz (87.2 kg)   Labs  CBC  Recent Labs  03/18/14 1940 03/20/14 0345  WBC 5.0 5.2  HGB 9.9* 10.2*  HCT 29.2* 30.3*  MCV 86.4 84.6  PLT 130* 213*   Basic Metabolic Panel  Recent Labs  03/19/14 0449 03/20/14 0345  NA 138 138  K 2.9* 3.1*  CL 103 100  CO2 26 28  GLUCOSE 88 125*  BUN 81* 77*  CREATININE 4.17* 3.97*  CALCIUM 8.7 8.6  PHOS  --  4.0   Liver Function Tests  Recent Labs  03/20/14 0345  ALBUMIN 3.1*   Disposition  Pt is being discharged home today in good condition.  Follow-up Plans & Appointments      Follow-up Information    Follow up with Cristopher Peru, MD On 04/02/2014.   Specialty:  Cardiology   Why:  2:30 PM   Contact information:   [place] [address] [phone]       Follow up with Herminio Commons, MD On 04/28/2014.   Specialty:  Cardiology   Why:  10:00 AM   Contact information:   [place] [id number] [phone]       Follow up with HAGER, BRYAN, PA-C On 03/26/2014.   Specialty:  Physician Assistant   Why:  9:30 AM - Dr. Court Joy PA @ [facility] (will have opportunity to interrogate lifevest at this time).   Contact information:   [place] [address] [phone]       Discharge Medications    Medication List    STOP taking these medications        Colchicine 0.6 MG Caps     metoprolol succinate 25 MG 24 hr tablet  Commonly known as:  TOPROL-XL     naproxen sodium 220 MG tablet  Commonly known as:  ANAPROX      TAKE these medications        amLODipine 10 MG tablet  Commonly known as:  NORVASC  Take 1 tablet (10 mg total) by mouth daily.     aspirin EC 81 MG tablet  Take 81 mg by mouth daily.     atorvastatin 40 MG tablet  Commonly known as:  LIPITOR  Take 1 tablet (40 mg total) by mouth daily.       carvedilol 6.25 MG tablet  Commonly known as:  COREG  Take 1 tablet (6.25 mg total) by mouth 2 (two) times daily with a meal.     cloNIDine 0.3 MG tablet  Commonly known as:  CATAPRES  Take 0.3 mg by mouth 2 (two) times daily.     diphenhydramine-acetaminophen 25-500 MG Tabs  Commonly known as:  TYLENOL PM  Take 1 tablet by mouth at bedtime as needed (sleep/ pain).     febuxostat 40 MG tablet  Commonly known as:  ULORIC  Take 1 tablet (40 mg total) by mouth daily.     hydrALAZINE 50 MG tablet  Commonly known as:  APRESOLINE  Take 1 tablet (50 mg total) by mouth 2 (two) times daily.     Insulin Glargine 100 UNIT/ML Solostar Pen  Commonly known as:  LANTUS SOLOSTAR  Inject 10 Units into the skin daily at  10 pm.     isosorbide dinitrate 10 MG tablet  Commonly known as:  ISORDIL  Take 1 tablet (10 mg total) by mouth 3 (three) times daily.     metolazone 2.5 MG tablet  Commonly known as:  ZAROXOLYN  2.5 mg Daily x 3 days and then 2.5 mg every Monday, Wednesday, and Friday.     ONE TOUCH ULTRA TEST test strip  Generic drug:  glucose blood     potassium chloride SA 20 MEQ tablet  Commonly known as:  K-DUR,KLOR-CON  Take 1 tablet (20 mEq total) by mouth 3 (three) times daily.     torsemide 100 MG tablet  Commonly known as:  DEMADEX  Take 100 mg by mouth 2 (two) times daily.       Outstanding Labs/Studies  BMET in 1 week. Lipids and LFT's in 8 wks.  Duration of Discharge Encounter   Greater than 30 minutes including physician time.  Signed, [name] NP 03/20/2014, 1:42 PM

## 2014-03-20 NOTE — Discharge Instructions (Signed)
**  PLEASE REMEMBER TO BRING ALL OF YOUR MEDICATIONS TO EACH OF YOUR FOLLOW-UP OFFICE VISITS. ° °Groin Site Care °Refer to this sheet in the next few weeks. These instructions provide you with information on caring for yourself after your procedure. Your caregiver may also give you more specific instructions. Your treatment has been planned according to current medical practices, but problems sometimes occur. Call your caregiver if you have any problems or questions after your procedure. °HOME CARE INSTRUCTIONS °· You may shower 24 hours after the procedure. Remove the bandage (dressing) and gently wash the site with plain soap and water. Gently pat the site dry.  °· Do not apply powder or lotion to the site.  °· Do not sit in a bathtub, swimming pool, or whirlpool for 5 to 7 days.  °· No bending, squatting, or lifting anything over 10 pounds (4.5 kg) as directed by your caregiver.  °· Inspect the site at least twice daily.  °· Do not drive home if you are discharged the same day of the procedure. Have someone else drive you.  °What to expect: °· Any bruising will usually fade within 1 to 2 weeks.  °· Blood that collects in the tissue (hematoma) may be painful to the touch. It should usually decrease in size and tenderness within 1 to 2 weeks.  °SEEK IMMEDIATE MEDICAL CARE IF: °· You have unusual pain at the groin site or down the affected leg.  °· You have redness, warmth, swelling, or pain at the groin site.  °· You have drainage (other than a small amount of blood on the dressing).  °· You have chills.  °· You have a fever or persistent symptoms for more than 72 hours.  °· You have a fever and your symptoms suddenly get worse.  °· Your leg becomes pale, cool, tingly, or numb.  °You have heavy bleeding from the site. Hold pressure on the site. .  °

## 2014-03-20 NOTE — Progress Notes (Signed)
SUBJECTIVE:  No complaints and wants to go home  OBJECTIVE:   Vitals:   Filed Vitals:   03/19/14 1827 03/19/14 2011 03/20/14 0621 03/20/14 1037  BP: 177/51 153/39 143/35 154/41  Pulse:  85 62 65  Temp:  97.9 F (36.6 C) 98.5 F (36.9 C)   TempSrc:  Oral Oral   Resp:  17 18   Height:      Weight:   192 lb 3.9 oz (87.2 kg)   SpO2:  99% 99%    I&O's:   Intake/Output Summary (Last 24 hours) at 03/20/14 1137 Last data filed at 03/20/14 7353  Gross per 24 hour  Intake 722.67 ml  Output    400 ml  Net 322.67 ml   TELEMETRY: Reviewed telemetry pt in NSR     PHYSICAL EXAM General: Well developed, well nourished, in no acute distress Head: Eyes PERRLA, No xanthomas.   Normal cephalic and atramatic  Lungs:   Clear bilaterally to auscultation and percussion. Heart:   HRRR S1 S2 Pulses are 2+ & equal. Abdomen: Bowel sounds are positive, abdomen soft and non-tender without masses  Extremities:   No clubbing, cyanosis or edema.  DP +1 Neuro: Alert and oriented X 3. Psych:  Good affect, responds appropriately   LABS: Basic Metabolic Panel:  Recent Labs  03/19/14 0449 03/20/14 0345  NA 138 138  K 2.9* 3.1*  CL 103 100  CO2 26 28  GLUCOSE 88 125*  BUN 81* 77*  CREATININE 4.17* 3.97*  CALCIUM 8.7 8.6  PHOS  --  4.0   Liver Function Tests:  Recent Labs  03/20/14 0345  ALBUMIN 3.1*   No results for input(s): LIPASE, AMYLASE in the last 72 hours. CBC:  Recent Labs  03/18/14 1940 03/20/14 0345  WBC 5.0 5.2  HGB 9.9* 10.2*  HCT 29.2* 30.3*  MCV 86.4 84.6  PLT 130* 148*   Cardiac Enzymes: No results for input(s): CKTOTAL, CKMB, CKMBINDEX, TROPONINI in the last 72 hours. BNP: Invalid input(s): POCBNP D-Dimer: No results for input(s): DDIMER in the last 72 hours. Hemoglobin A1C: No results for input(s): HGBA1C in the last 72 hours. Fasting Lipid Panel: No results for input(s): CHOL, HDL, LDLCALC, TRIG, CHOLHDL, LDLDIRECT in the last 72 hours. Thyroid  Function Tests: No results for input(s): TSH, T4TOTAL, T3FREE, THYROIDAB in the last 72 hours.  Invalid input(s): FREET3 Anemia Panel:  Recent Labs  03/20/14 0345  FERRITIN 466*  TIBC 204*  IRON 90   Coag Panel:   Lab Results  Component Value Date   INR 1.02 03/18/2014   INR 1.09 11/18/2008    RADIOLOGY: Nm Myocar Multi W/spect W/wall Motion / Ef  03/07/2014   CLINICAL DATA:  69 year old patient with chronic systolic heart failure, hypertension, diabetes, sleep apnea, and stage 4 chronic kidney disease with worsening exertional dyspnea and referred for an ischemic evaluation.  EXAM: MYOCARDIAL IMAGING WITH SPECT (REST AND PHARMACOLOGIC-STRESS)  GATED LEFT VENTRICULAR WALL MOTION STUDY  LEFT VENTRICULAR EJECTION FRACTION  TECHNIQUE: Standard myocardial SPECT imaging was performed after resting intravenous injection of 10 mCi Tc-83m sestamibi. Subsequently, intravenous infusion of Lexiscan was performed under the supervision of the Cardiology staff. At peak effect of the drug, 30 mCi Tc-23m sestamibi was injected intravenously and standard myocardial SPECT imaging was performed. Quantitative gated imaging was also performed to evaluate left ventricular wall motion, and estimate left ventricular ejection fraction.  COMPARISON:  None.  FINDINGS: Stress/ECG data: The patient was initially stressed with the Bruce protocol  but was unable to achieve an adequate heart rate and less it was switched to a Lexiscan. The heart rate ranged from 75 to 120 beats per min. The blood pressure ranged from 170/53 up to 200/57.  Resting ECG showed sinus rhythm with a left bundle branch block which persisted throughout the study. PVCs were noted in recovery. This is a nondiagnostic ECG.  Perfusion: There is a moderate sized area of mildly decreased uptake in the inferior wall extending from the apex to the base. Images are worse on rest than stress. While this is suggestive of inferior wall scar, overlying soft tissue  attenuation cannot entirely be ruled out.  Wall Motion: Severe global hypokinesis with the inferior wall more akinetic. Left ventricle is dilated at stress and rest.  Left Ventricular Ejection Fraction: 20 %  End diastolic volume 578 ml  End systolic volume 469 ml  IMPRESSION: 1. Moderate degree of inferior wall scar. However, overlying soft tissue attenuation cannot entirely be ruled out.  2. Severe global hypokinesis with the inferior wall more akinetic.  3. Left ventricular ejection fraction 20%  4. High-risk stress test findings* primarily based on severe left ventricular dysfunction.  *2012 Appropriate Use Criteria for Coronary Revascularization Focused Update: J Am Coll Cardiol. 6295;28(4):132-440. http://content.airportbarriers.com.aspx?articleid=1201161   Electronically Signed   By: Kate Sable   On: 03/07/2014 19:50    ASSESSMENT AND PLAN: 1. Chronic systolic heart failure with severely reduced LV systolic function, EF 10-27%:  Will check with renal about restarting home dose of torsemide 100 mg bid (evening dose at 4 pm to help with sleep).  Renal stated in note to restart metolazone 2.5mg  daily for 3 days and then MWF as creatinine is stable post cath and LVEDP was 83mmHg at cath. He complains of worsening gout from hydralazine, sodose has been decreased to 50 mg bid. He is not a candidate for spironolactone or ACEI/ARB due to severe CKD. Continue isosorbide dinitrate 10 mg tid. Continue ASA.  Change Toprol to coreg.   Continue Lipitor 40 mg daily, and check lipids and LFT's in 6 weeks. LFT's were normal in 1/16.  2. Essential HTN: Borderline control and trending upward after decreasing dose of hydralazine for gout.  Will increase amlodipine to 10mg  daily.    3. Valvular heart disease with moderate mitral regurgitation and mild aortic stenosis and regurgitation: Symptomatically stable.   4. CKD stage 4: Stable. Followed by nephrology. Inpatient consultation appreciated for cardiac  catheterization, as he is at high risk for contrast-induced nephropathy and subsequent need for dialysis.  Creatinine actually improved this am from admit.  Renal following and states stable for d/c home today.    5. Anemia secondary to CKD: Hgb 12.7 on 2/16. Receives EPO injections.  6. Tobacco abuse: Cessation counseling provided, and he appears motivated to quit.  7.  Hypokalemia - replete  8. Palpitations with frequent PVC's and severely reduced LV systolic function: Not inclined to increase the BB dose given his resting HR of 65 bpm but will change to Coreg 6.25mg  BID and follow HR. His last EF assessment prior to admission showed an EF of 60% 06/2012 but now EF reduced to 20%.  He will need to be on medical therapy for heart failure  for 3 months and recheck LVF before considering ICD.  I will order a Lifevest for the next 3 months until EF reassessed.  I will get a 24 hour Holter to assess PVC load.  If high this could be contributing to his  decline in LVF.    I have had a long discussion with the patient and his wife.  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  I explained that he is at risk for sudden cardiac death in the 3 month interim until we reassess EF after aggressive medical therapy and would not be protected.  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  I informed her that since we have instructed him not to drive, if he drives and has an arrhythmia that causes an accident he is liable.  His wife stated she did not want to consider life vest or ICD but patient is willing to talk with Life vest Rep.    Patient stable from cardiac standpoint for discharge pending able to get life vest.  Will confer with renal on dose of Torsemide to go home on.  Will follow up with extender in 1 week.    Harold Margarita, MD  03/20/2014  11:37 AM

## 2014-03-20 NOTE — Progress Notes (Signed)
   Spoke with Lysbeth Penner re: LifeVest.  Order placed.  Awaiting disposition with plan for possible discharge today.  [name], NP 03/20/2014, 12:50 PM

## 2014-03-20 NOTE — Progress Notes (Signed)
Patient ID: [name], male   DOB: 10/27/1945, 69 y.o.   MRN: [id number] S:Feels better O:BP 143/35 mmHg  Pulse 62  Temp(Src) 98.5 F (36.9 C) (Oral)  Resp 18  Ht 5\' 9"  (1.753 m)  Wt 87.2 kg (192 lb 3.9 oz)  BMI 28.38 kg/m2  SpO2 99%  Intake/Output Summary (Last 24 hours) at 03/20/14 0913 Last data filed at 03/20/14 0000  Gross per 24 hour  Intake 482.67 ml  Output    400 ml  Net  82.67 ml   Intake/Output: I/O last 3 completed shifts: In: 722.7 [P.O.:240; I.V.:482.7] Out: 400 [Urine:400]  Intake/Output this shift:    Weight change: -2.476 kg (-5 lb 7.3 oz) Gen:WD WN AAM in NAD CVS:no rub Resp:cta IOX:BDZHGD Ext:+pedal edema L>R   Recent Labs Lab 03/18/14 1940 03/19/14 0449 03/20/14 0345  NA 135 138 138  K 3.1* 2.9* 3.1*  CL 98 103 100  CO2 27 26 28   GLUCOSE 226* 88 125*  BUN 79* 81* 77*  CREATININE 4.16* 4.17* 3.97*  ALBUMIN  --   --  3.1*  CALCIUM 8.5 8.7 8.6  PHOS  --   --  4.0   Liver Function Tests:  Recent Labs Lab 03/20/14 0345  ALBUMIN 3.1*   No results for input(s): LIPASE, AMYLASE in the last 168 hours. No results for input(s): AMMONIA in the last 168 hours. CBC:  Recent Labs Lab 03/18/14 1940 03/20/14 0345  WBC 5.0 5.2  HGB 9.9* 10.2*  HCT 29.2* 30.3*  MCV 86.4 84.6  PLT 130* 148*   Cardiac Enzymes: No results for input(s): CKTOTAL, CKMB, CKMBINDEX, TROPONINI in the last 168 hours. CBG:  Recent Labs Lab 03/18/14 2316 03/19/14 0609 03/19/14 1140 03/19/14 2239 03/20/14 0642  GLUCAP 144* 86 134* 217* 120*    Iron Studies: No results for input(s): IRON, TIBC, TRANSFERRIN, FERRITIN in the last 72 hours. Studies/Results: No results found. Marland Kitchen amLODipine  5 mg Oral Daily  . aspirin EC  81 mg Oral Daily  . atorvastatin  40 mg Oral Daily  . cloNIDine  0.3 mg Oral BID  . febuxostat  40 mg Oral Daily  . heparin  5,000 Units Subcutaneous 3 times per day  . hydrALAZINE  50 mg Oral BID  . insulin aspart  0-15 Units Subcutaneous  TID WC  . insulin glargine  5 Units Subcutaneous QHS  . isosorbide dinitrate  10 mg Oral TID  . metoprolol succinate  25 mg Oral Daily  . potassium chloride SA  20 mEq Oral Daily  . sodium chloride  3 mL Intravenous Q12H  . torsemide  50 mg Oral Daily    BMET    Component Value Date/Time   NA 138 03/20/2014 0345   K 3.1* 03/20/2014 0345   CL 100 03/20/2014 0345   CO2 28 03/20/2014 0345   GLUCOSE 125* 03/20/2014 0345   BUN 77* 03/20/2014 0345   CREATININE 3.97* 03/20/2014 0345   CREATININE 4.05* 02/27/2014 0841   CALCIUM 8.6 03/20/2014 0345   GFRNONAA 14* 03/20/2014 0345   GFRNONAA 18* 01/30/2014 0833   GFRAA 16* 03/20/2014 0345   GFRAA 20* 01/30/2014 0833   CBC    Component Value Date/Time   WBC 5.2 03/20/2014 0345   RBC 3.58* 03/20/2014 0345   HGB 10.2* 03/20/2014 0345   HCT 30.3* 03/20/2014 0345   PLT 148* 03/20/2014 0345   MCV 84.6 03/20/2014 0345   MCH 28.5 03/20/2014 0345   MCHC 33.7 03/20/2014 0345  RDW 14.9 03/20/2014 0345   LYMPHSABS 1.0 12/01/2013 0823   MONOABS 1.0 12/01/2013 0823   EOSABS 0.6 12/01/2013 0823   BASOSABS 0.1 12/01/2013 0823     Assessment/Plan: 1. CAD/worsening CMP- agree with Dr. Antionette Char assessment. Pt with escalating symptoms and worsening of EF warrants further cardiac workup.  1. Had a negative left heart cath and received only 20cc of contrast 2. Right heart cath revealed elevated pressors 3. Stable for d/c to home today 4. Take zaroxolyn 2.5mg  daily for next 3 days then MWF and f/u with Cardiology and Dr. Mercy Moore on 04/03/14 2:00pm 5. Given his severe CMP, he may not do well with dialysis and will allow him to discuss this further with his family and Dr. Mercy Moore.  2. CKD stage 4- stable renal function and okay for discharge home with f/u with Cardiology and Dr. Mercy Moore.   3. Anemia of chronic disease- will check iron stores and initiate epo therapy 4. DM- per primary svc 5. Hypokalemia- replete and follow 6. SHPTH- will  follow Ca/Phos/iPTH 7. Dispo- stable for discharge from a renal standpoint. Will sign off.  [place] A

## 2014-03-20 NOTE — Care Management Note (Signed)
    Page 1 of 1   03/20/2014     3:21:45 PM CARE MANAGEMENT NOTE 03/20/2014  Patient:  [name], [name]   Account Number:  [id number]  Date Initiated:  03/20/2014  Documentation initiated by:  Marvetta Gibbons  Subjective/Objective Assessment:   Pt admitted with severe cardiomyopathy and CHF- EF 25%     Action/Plan:   PTA pt lived at home   Anticipated DC Date:  03/21/2014   Anticipated DC Plan:  [place]  CM consult      Choice offered to / List presented to:     DME arranged  OTHER - SEE COMMENT           Status of service:  In process, will continue to follow Medicare Important Message given?   (If response is "NO", the following Medicare IM given date fields will be blank) Date Medicare IM given:   Medicare IM given by:   Date Additional Medicare IM given:   Additional Medicare IM given by:    Discharge Disposition:    Per UR Regulation:  Reviewed for med. necessity/level of care/duration of stay  If discussed at [place] of Stay Meetings, dates discussed:    Comments:  03/20/14- 1500- Marvetta Gibbons RN, BSN [phone] Referral received for LifeVest- spoke with Eulas Post from [place] who is already aware of need- and is following pt for LifeVest approval.

## 2014-03-24 ENCOUNTER — Ambulatory Visit (INDEPENDENT_AMBULATORY_CARE_PROVIDER_SITE_OTHER): Payer: Medicare Other | Admitting: Adult Health

## 2014-03-24 ENCOUNTER — Inpatient Hospital Stay (HOSPITAL_COMMUNITY): Admission: RE | Admit: 2014-03-24 | Payer: Medicare Other | Source: Ambulatory Visit

## 2014-03-24 ENCOUNTER — Encounter: Payer: Self-pay | Admitting: Adult Health

## 2014-03-24 VITALS — BP 140/56 | Wt 197.2 lb

## 2014-03-24 DIAGNOSIS — I10 Essential (primary) hypertension: Secondary | ICD-10-CM

## 2014-03-24 DIAGNOSIS — I420 Dilated cardiomyopathy: Secondary | ICD-10-CM

## 2014-03-24 MED ORDER — CARVEDILOL 12.5 MG PO TABS: 12.5000 mg | ORAL_TABLET | Freq: Two times a day (BID) | ORAL | Status: DC

## 2014-03-24 NOTE — Progress Notes (Signed)
Cardiology Office Note   Date:  03/24/2014   ID:  [name], DOB 01/03/1946, MRN [id number]  PCP:  Odette Fraction, MD  Cardiologist: Michaele Offer, NP   Chief Complaint  Patient presents with  . Congestive Heart Failure  . Aortic Stenosis      History of Present Illness: [name] is a 69 y.o. male who presents for ongoing assessment and management of nonischemic cardiomyopathy, status post cardiac catheterization revealing normal coronaries and elevated filling pressures, chronic systolic heart failure, hypertension, and aortic valve disease.  He was recently admitted on 03/18/2058 in the setting of abnormal echocardiogram with LV dysfunction, EF of 20-25% and grade 2 diastolic dysfunction.  Because of chronic kidney disease.  Decision was made to admit him one day early for nephrology evaluation.  He was maintained on beta blocker therapy, do to frequent PVCs and concern for nonsustained ventricular tachycardia.  A lot less placement was administered.  He is here for post hospitalization followup.  He was continued on carvedilol 6.25 mg, clonidine, hydralazine, metolazone, and isosorbide.  He also was continued on torsemide 100 mg twice a day.  Echocardiogram demonstrated mild AS.  [name] comes today with multiple complaints about his recent hospitalization, lack of information on reasons for his medications and need to wear a light fast, also several questions about his prognosis and current cardiac status.  I have spent approximately 45 minutes with this patient going over his health status.  Medications, diagnoses, and reasons for Life Vest.   Past Medical History  Diagnosis Date  . Chronic combined systolic and diastolic CHF (congestive heart failure)     a. 02/2014 Echo: EF 20-25% (new), Gr 2 DD, mod conc LVH, mild AI/AS, mod MR, sev dil LA, mild TR.  Marland Kitchen Nonischemic cardiomyopathy     a. 02/2014 Echo: EF 20-25%;  b. 03/2014 Cath: LM nl, LAD min irregs, LCX   .  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 Hypertension   . Chronic kidney disease (CKD), stage IV (severe) 12/2009    a. baseline creat now ~ 4.  . Erectile dysfunction   . Colonic polyp   . Low serum testosterone level   . Tobacco abuse   . Sleep apnea 2010    Initially unable to afford CPAP  . Benign prostatic hypertrophy   . Obesity, Class I, BMI 30-34.9   . Hyperlipidemia   . Congestive heart failure (CHF)     EF 25-30% 07/2011  . Adenomatous colon polyp 2006  . H. pylori infection 2013    treated with prevpac  . Mild Ao Stenosis and Insufficiency     Past Surgical History  Procedure Laterality Date  . Retinal laser procedure      diabetic retinopathy  . Lymph node biopsy      surgical exploration of neck-not entirely clear that this represented a lymph node biopsy  . Colonoscopy  08/23/2004    Dr. Vivi Ferns rectum, diminutive polyp of the rectosigmoid removed, inflamed focally adenomatous polyp  . Left and right heart catheterization with coronary angiogram N/A 03/19/2014    Procedure: LEFT AND RIGHT HEART CATHETERIZATION WITH CORONARY ANGIOGRAM;  Surgeon: Blane Ohara, MD;  Location: [facility] CATH LAB;  Service: Cardiovascular;  Laterality: N/A;     Current Outpatient Prescriptions  Medication Sig Dispense Refill  . amLODipine (NORVASC) 10 MG tablet Take 1 tablet (10 mg total) by mouth daily. 30 tablet 6  .  aspirin EC 81 MG tablet Take 81 mg by mouth daily.    Marland Kitchen atorvastatin (LIPITOR) 40 MG tablet Take 1 tablet (40 mg total) by mouth daily. 90 tablet 3  . carvedilol (COREG) 6.25 MG tablet Take 1 tablet (6.25 mg total) by mouth 2 (two) times daily with a meal. 60 tablet 6  . cloNIDine (CATAPRES) 0.3 MG tablet Take 0.3 mg by mouth 2 (two) times daily.     . diphenhydramine-acetaminophen (TYLENOL PM) 25-500 MG TABS Take 1 tablet by mouth at bedtime as needed (sleep/ pain).    . febuxostat (ULORIC) 40 MG tablet Take 1 tablet (40 mg total) by mouth  daily. 30 tablet 2  . hydrALAZINE (APRESOLINE) 50 MG tablet Take 1 tablet (50 mg total) by mouth 2 (two) times daily. 180 tablet 3  . Insulin Glargine (LANTUS SOLOSTAR) 100 UNIT/ML Solostar Pen Inject 10 Units into the skin daily at 10 pm. (Patient taking differently: Inject 10-15 Units into the skin at bedtime. ) 5 pen PRN  . isosorbide dinitrate (ISORDIL) 10 MG tablet Take 1 tablet (10 mg total) by mouth 3 (three) times daily. 90 tablet 3  . metolazone (ZAROXOLYN) 2.5 MG tablet 2.5 mg Daily x 3 days and then 2.5 mg every Monday, Wednesday, and Friday. 30 tablet 3  . ONE TOUCH ULTRA TEST test strip     . potassium chloride SA (K-DUR,KLOR-CON) 20 MEQ tablet Take 1 tablet (20 mEq total) by mouth 3 (three) times daily. 90 tablet 3  . torsemide (DEMADEX) 100 MG tablet Take 100 mg by mouth 2 (two) times daily.      No current facility-administered medications for this visit.    Allergies:   Ace inhibitors    Social History:  The patient  reports that he has been smoking Cigarettes.  He started smoking about 55 years ago. He has a 11.25 pack-year smoking history. He uses smokeless tobacco. He reports that he drinks about 2.4 oz of alcohol per week. He reports that he does not use illicit drugs.   Family History:  The patient's family history includes Cancer in his father and mother; Diabetes in his sister; Hypertension in his mother.    ROS: .   All other systems are reviewed and negative.Unless otherwise mentioned in H&P above.   PHYSICAL EXAM: VS:  There were no vitals taken for this visit. , BMI There is no weight on file to calculate BMI. GEN: Well nourished, well developed, in no acute distress HEENT: normal Neck: no JVD, carotid bruits, or masses Cardiac: RRR; no murmurs, rubs, or gallops,no edema Distant heart sounds Respiratory:  clear to auscultation bilaterally, normal work of breathing GI: soft, nontender, nondistended, + BS MS: no deformity or atrophy Skin: warm and dry, no  rash Neuro:  Strength and sensation are intact Psych: euthymic mood, full affect   Recent Labs: 12/01/2013: Pro B Natriuretic peptide (BNP) 37333.0* 01/22/2014: B Natriuretic Peptide >4500.0* 01/30/2014: ALT 11 02/27/2014: Magnesium 1.6 03/20/2014: BUN 77*; Creatinine 3.97*; Hemoglobin 10.2*; Platelets 148*; Potassium 3.1*; Sodium 138    Lipid Panel    Component Value Date/Time   CHOL 158 01/30/2014 0833   TRIG 55 01/30/2014 0833   HDL 71 01/30/2014 0833   CHOLHDL 2.2 01/30/2014 0833   VLDL 11 01/30/2014 0833   LDLCALC 76 01/30/2014 0833      Wt Readings from Last 3 Encounters:  03/20/14 192 lb 3.9 oz (87.2 kg)  03/13/14 196 lb 3.2 oz (88.996 kg)  02/26/14 196 lb 9.6  oz (89.177 kg)      Other studies Reviewed: Additional studies/ records that were reviewed today include:  Review of the above records demonstrates: initiation of blood tests, echocardiogram, revealing an EF of 20%, renal insufficiency.   ASSESSMENT AND PLAN:  1.Acute on chronic nonischemic cardiomyopathy:  most recent echo, revealing an EF of 20-25% with grade 2 diastolic dysfunction.patient had cardiac catheterization as described above.  He was sent home with a life vest.  He had no idea why he is wearing it, and states he was not given.  A choice on whether or not to accept it.  The patient was also uncertain what his medications were for.  I have spent greater than 45 minutes with this patient going over each individual medication and discussing its use.  He does not wish to take atorvastatin.  He is willing to wear the life vest now that he has been given an explanation.  I will increase his carvedilol to 12.5 mg twice a day as his blood pressure is not well controlled here in the office there is no evidence of fluid overload currently.  He is also not willing to have an appointment with Dr. Lovena Le as scheduled as he is uncertain that he will want to have an ICD placed. Continue torsemide as directed twice a day.  We will see him in one month  2. Hypertension: blood pressure is now well-controlled for a patient with a significant systolic dysfunction.  As, stated, he will increase his carvedilol.  He continues on clonidine, hydralazine, and isosorbide.he is advised against high salt diet.  3. Chronic kidney disease.he is followed by [facility], and currently refuses dialysis catheter placement.  On discharge, patient's creatinine was 3.97.  He is not on an ACE inhibitor secondary to this.  4. Diabetes  He is advised that the carvedilol can affect his blood sugars causing them to be higher.  He is followed by Dr. Dennard Schaumann will have insulin adjusted this week.he is advised to follow him for medical management   Current medicines are reviewed at length with the patient today.  The patient multiple concerns regarding medicines reasons for take down and their indications. I have spent greater than 45 minutes with this patient and his wife answering multiple questions and explaining his medications.  He is willing to continue to wear the lifetest and increase the dose of carvedilol. Labs/ tests ordered today include: BMET No orders of the defined types were placed in this encounter.     Disposition:   FU with Dr. Bronson Ing in one month. Signed, Jory Sims, NP  03/24/2014 7:17 AM    [facility] [address], [place], [place] [address] Phone: [phone]; Fax: [phone]

## 2014-03-24 NOTE — Patient Instructions (Addendum)
Your physician recommends that you schedule a follow-up appointment with Dr. Bronson Ing  Your physician recommends that you schedule a follow-up appointment Dr. Lovena Le in May  Stop taking:  Lipitor   Increase: Carvedilol 12.5 mg two time daily  Please have a copy your lab results sent to our office.   Thank you for choosing [facility]!

## 2014-03-24 NOTE — Progress Notes (Deleted)
Name: [name]    DOB: Apr 06, 1945  Age: 69 y.o.  MR#: [id number]       PCP:  Odette Fraction, MD      Insurance: Payor: [employer] MEDICARE / Plan: [facility] MEDICARE / Product Type: *No Product type* /   CC:    Chief Complaint  Patient presents with  . Congestive Heart Failure  . Aortic Stenosis    VS Filed Vitals:   03/24/14 1447  BP: 140/56  Weight: 197 lb 3.2 oz (89.449 kg)    Weights Current Weight  03/24/14 197 lb 3.2 oz (89.449 kg)  03/20/14 192 lb 3.9 oz (87.2 kg)  03/13/14 196 lb 3.2 oz (88.996 kg)    Blood Pressure  BP Readings from Last 3 Encounters:  03/24/14 140/56  03/20/14 137/37  03/13/14 140/44     Admit date:  (Not on file) Last encounter with RMR:  02/13/2014   Allergy Ace inhibitors  Current Outpatient Prescriptions  Medication Sig Dispense Refill  . amLODipine (NORVASC) 10 MG tablet Take 1 tablet (10 mg total) by mouth daily. 30 tablet 6  . aspirin EC 81 MG tablet Take 81 mg by mouth daily.    Marland Kitchen atorvastatin (LIPITOR) 40 MG tablet Take 1 tablet (40 mg total) by mouth daily. 90 tablet 3  . carvedilol (COREG) 6.25 MG tablet Take 1 tablet (6.25 mg total) by mouth 2 (two) times daily with a meal. 60 tablet 6  . cloNIDine (CATAPRES) 0.3 MG tablet Take 0.3 mg by mouth 2 (two) times daily.     . diphenhydramine-acetaminophen (TYLENOL PM) 25-500 MG TABS Take 1 tablet by mouth at bedtime as needed (sleep/ pain).    . febuxostat (ULORIC) 40 MG tablet Take 1 tablet (40 mg total) by mouth daily. 30 tablet 2  . hydrALAZINE (APRESOLINE) 50 MG tablet Take 1 tablet (50 mg total) by mouth 2 (two) times daily. 180 tablet 3  . Insulin Glargine (LANTUS SOLOSTAR) 100 UNIT/ML Solostar Pen Inject 10 Units into the skin daily at 10 pm. (Patient taking differently: Inject 10-15 Units into the skin at bedtime. ) 5 pen PRN  . isosorbide dinitrate (ISORDIL) 10 MG tablet Take 1 tablet (10 mg total) by mouth 3 (three) times daily. 90 tablet 3  . metolazone (ZAROXOLYN) 2.5  MG tablet 2.5 mg Daily x 3 days and then 2.5 mg every Monday, Wednesday, and Friday. 30 tablet 3  . ONE TOUCH ULTRA TEST test strip     . potassium chloride SA (K-DUR,KLOR-CON) 20 MEQ tablet Take 1 tablet (20 mEq total) by mouth 3 (three) times daily. 90 tablet 3  . torsemide (DEMADEX) 100 MG tablet Take 100 mg by mouth 2 (two) times daily.      No current facility-administered medications for this visit.    Discontinued Meds:   There are no discontinued medications.  Patient Active Problem List   Diagnosis Date Noted  . Nonischemic cardiomyopathy 03/20/2014  . Cardiomyopathy   . Chronic systolic heart failure 78/46/9629  . Palpitations 02/26/2014  . CHF (congestive heart failure) 12/01/2013  . Acute on chronic diastolic CHF (congestive heart failure), NYHA class 2 12/01/2013  . HTN (hypertension) 12/01/2013  . Chest pain 11/02/2013  . Hypokalemia 11/02/2013  . Moderate mitral regurgitation 08/12/2011  . Cholelithiasis 08/10/2011  . Moderate dehydration 07/25/2011  . Chronic systolic congestive heart failure 07/21/2011  . Dyspnea 07/19/2011  . COPD (chronic obstructive pulmonary disease) 07/18/2011  . Pulmonary nodule 07/14/2011  . Poor appetite 07/12/2011  .  Change in bowel movement 07/12/2011  . Syncope 05/12/2011  . Sinusitis 05/12/2011  . Ankle pain, left 02/03/2011  . ED (erectile dysfunction) 11/02/2010  . Aortic valve disease 04/16/2010  . Chronic kidney disease, stage 4, severely decreased GFR 04/16/2010  . SLEEP APNEA 01/14/2010  . DEPRESSION 06/29/2009  . BENIGN PROSTATIC HYPERTROPHY, HX OF 03/02/2009  . DEPENDENT EDEMA, LEGS 02/18/2008  . Diabetes mellitus type 2 with retinopathy 02/23/2007  . TOBACCO ABUSE 02/23/2007  . Essential hypertension 02/23/2007    LABS    Component Value Date/Time   NA 138 03/20/2014 0345   NA 138 03/19/2014 0449   NA 135 03/18/2014 1940   K 3.1* 03/20/2014 0345   K 2.9* 03/19/2014 0449   K 3.1* 03/18/2014 1940   CL 100  03/20/2014 0345   CL 103 03/19/2014 0449   CL 98 03/18/2014 1940   CO2 28 03/20/2014 0345   CO2 26 03/19/2014 0449   CO2 27 03/18/2014 1940   GLUCOSE 125* 03/20/2014 0345   GLUCOSE 88 03/19/2014 0449   GLUCOSE 226* 03/18/2014 1940   BUN 77* 03/20/2014 0345   BUN 81* 03/19/2014 0449   BUN 79* 03/18/2014 1940   CREATININE 3.97* 03/20/2014 0345   CREATININE 4.17* 03/19/2014 0449   CREATININE 4.16* 03/18/2014 1940   CREATININE 4.05* 02/27/2014 0841   CREATININE 3.33* 01/30/2014 0833   CREATININE 4.59* 10/28/2013 0847   CALCIUM 8.6 03/20/2014 0345   CALCIUM 8.7 03/19/2014 0449   CALCIUM 8.5 03/18/2014 1940   GFRNONAA 14* 03/20/2014 0345   GFRNONAA 13* 03/19/2014 0449   GFRNONAA 13* 03/18/2014 1940   GFRNONAA 18* 01/30/2014 0833   GFRNONAA 12* 10/28/2013 0847   GFRNONAA 11* 07/09/2013 0902   GFRAA 16* 03/20/2014 0345   GFRAA 15* 03/19/2014 0449   GFRAA 15* 03/18/2014 1940   GFRAA 20* 01/30/2014 0833   GFRAA 14* 10/28/2013 0847   GFRAA 12* 07/09/2013 0902   CMP     Component Value Date/Time   NA 138 03/20/2014 0345   K 3.1* 03/20/2014 0345   CL 100 03/20/2014 0345   CO2 28 03/20/2014 0345   GLUCOSE 125* 03/20/2014 0345   BUN 77* 03/20/2014 0345   CREATININE 3.97* 03/20/2014 0345   CREATININE 4.05* 02/27/2014 0841   CALCIUM 8.6 03/20/2014 0345   PROT 6.2 01/30/2014 0833   ALBUMIN 3.1* 03/20/2014 0345   AST 11 01/30/2014 0833   ALT 11 01/30/2014 0833   ALKPHOS 59 01/30/2014 0833   BILITOT 0.5 01/30/2014 0833   GFRNONAA 14* 03/20/2014 0345   GFRNONAA 18* 01/30/2014 0833   GFRAA 16* 03/20/2014 0345   GFRAA 20* 01/30/2014 0833       Component Value Date/Time   WBC 5.2 03/20/2014 0345   WBC 5.0 03/18/2014 1940   WBC 7.7 01/22/2014 2302   HGB 10.2* 03/20/2014 0345   HGB 9.9* 03/18/2014 1940   HGB 12.7* 03/04/2014 0930   HCT 30.3* 03/20/2014 0345   HCT 29.2* 03/18/2014 1940   HCT 37.3* 03/04/2014 0930   MCV 84.6 03/20/2014 0345   MCV 86.4 03/18/2014 1940   MCV  88.0 01/22/2014 2302    Lipid Panel     Component Value Date/Time   CHOL 158 01/30/2014 0833   TRIG 55 01/30/2014 0833   HDL 71 01/30/2014 0833   CHOLHDL 2.2 01/30/2014 0833   VLDL 11 01/30/2014 0833   LDLCALC 76 01/30/2014 0833    ABG    Component Value Date/Time   PHART 7.416 03/19/2014 1658  PCO2ART 42.3 03/19/2014 1658   PO2ART 119.0* 03/19/2014 1658   HCO3 27.2* 03/19/2014 1658   TCO2 28 03/19/2014 1658   ACIDBASEDEF 1.8 11/11/2008 0215   O2SAT 99.0 03/19/2014 1658     Lab Results  Component Value Date   TSH 1.180 07/19/2011   BNP (last 3 results)  Recent Labs  01/22/14 2302  BNP >4500.0*    ProBNP (last 3 results)  Recent Labs  11/02/13 2047 12/01/13 0823  PROBNP 62727.0* 37333.0*    Cardiac Panel (last 3 results) No results for input(s): CKTOTAL, CKMB, TROPONINI, RELINDX in the last 72 hours.  Iron/TIBC/Ferritin/ %Sat    Component Value Date/Time   IRON 90 03/20/2014 0345   TIBC 204* 03/20/2014 0345   FERRITIN 466* 03/20/2014 0345   IRONPCTSAT 44 03/20/2014 0345     EKG Orders placed or performed in visit on 03/13/14  . Holter monitor - 48 hour     Prior Assessment and Plan Problem List as of 03/24/2014      Cardiovascular and Mediastinum   Aortic valve disease   Last Assessment & Plan 02/26/2014 Office Visit Written 02/26/2014  2:09 PM by Imogene Burn, PA-C    Patient has a significant AI murmur. Check 2-D echo      Chronic systolic congestive heart failure   Last Assessment & Plan 08/15/2013 Office Visit Written 08/15/2013  4:56 PM by Lendon Colonel, NP    No evidence of fluid overload on this office visit. I have asked him not to overdo the metolazone. He states when he takes it he does diuresis a bit. I have asked him to only use it maximally once a month, or even less. He has a history of dehydration, I do not wish him to overuse this medication. He verbalizes understanding.      Essential hypertension   Last Assessment & Plan  08/15/2013 Office Visit Written 08/15/2013  4:54 PM by Lendon Colonel, NP    Pressure is elevated on this visit, he has forgotten to take his clonidine, Demadex and metoprolol today. He apparently worked last night, and had not taken his "morning meds". I have advised him to be more compliant with his medications no matter which he works. I talked with him about rebound hypertension on clonidine. He verbalizes understanding. See him again in 6 months unless symptomatic.      Syncope   Last Assessment & Plan 08/12/2011 Office Visit Written 08/12/2011 10:21 AM by Renella Cunas, MD    No recurrence. He is still mildly orthostatic but his heart rate increases with the lower dose of labetalol. I still think he severely volume depleted. I decreased his torsemide to once a day with careful watching daily weights. If he becomes short of breath, developed swelling, or his weight exceeds greater than 3 pounds, he will go back to twice a day torsemide at that time. While he is taking it once a day we have cut his potassium to once a day. If he takes it twice a day he will take 2 potassiums. I'll see him back for close followup in about 3 weeks. Orthostatic precautions given.      Moderate mitral regurgitation   CHF (congestive heart failure)   Acute on chronic diastolic CHF (congestive heart failure), NYHA class 2   Last Assessment & Plan 02/26/2014 Office Visit Written 02/26/2014  2:09 PM by Imogene Burn, PA-C    Patient has acute on chronic CHF. It's been since 2014 that an  echo has been done. Will order 2-D echo to reassess his LV function. He also has stage IV kidney disease and is not urinating as much as he used to. Recommend follow-up with his renal doctor. He took them extra metolazone and is feeling better.      HTN (hypertension)   Last Assessment & Plan 02/26/2014 Office Visit Written 02/26/2014  2:09 PM by Imogene Burn, PA-C    Blood pressure is on the high side today. Recommend 2 g sodium  diet.      Chronic systolic heart failure   Cardiomyopathy   Nonischemic cardiomyopathy     Respiratory   Sinusitis   Last Assessment & Plan 05/12/2011 Office Visit Written 05/15/2011  9:26 AM by Fayrene Helper, MD    Sinus congestion with elevated WBC  And a shift, will treat with antibiotic, pt aware      COPD (chronic obstructive pulmonary disease)   Last Assessment & Plan 07/18/2011 Office Visit Edited 07/20/2011 12:42 PM by Fayrene Helper, MD     Acute exaccerbation of symptoms aggressive anti inflammatory          Digestive   Cholelithiasis   Last Assessment & Plan 07/12/2011 Office Visit Written 08/10/2011  8:38 AM by Fayrene Helper, MD    Pt symptomatic with weight loss        Endocrine   Diabetes mellitus type 2 with retinopathy   Last Assessment & Plan 07/11/2012 Office Visit Written 07/11/2012 10:41 AM by Lendon Colonel, NP    Noted foot ulcers on the tops of both feet at the 3rd metatarsal, with skin color changes on the right middle toe, with erythema. Will start Keflex 500mg  BID for 10 days. He is referred to podiatrist for ongoing evaluation. Good arterial pulses.        Genitourinary   Chronic kidney disease, stage 4, severely decreased GFR   Last Assessment & Plan 07/15/2013 Office Visit Written 07/15/2013  4:51 PM by Lendon Colonel, NP    Continued management by Dr. Lowanda Foster. We will defer any further medication changes to him      ED (erectile dysfunction)   Last Assessment & Plan 02/03/2011 Office Visit Written 02/06/2011  2:53 PM by Fayrene Helper, MD    Persistent and a source of some depression, at one time expressed interest in testosterone supplement if indicated, however , when I explained that this would be lifelong , he declined testing        Other   TOBACCO ABUSE   Last Assessment & Plan 02/26/2014 Office Visit Written 02/26/2014  2:11 PM by Imogene Burn, PA-C    Smoking cessation discussed.      DEPRESSION   DEPENDENT  EDEMA, LEGS   Last Assessment & Plan 09/06/2011 Office Visit Written 09/06/2011  3:09 PM by Renella Cunas, MD    Much improved.      BENIGN PROSTATIC HYPERTROPHY, HX OF   SLEEP APNEA   Last Assessment & Plan 08/14/2012 Office Visit Written 08/14/2012  2:06 PM by Lendon Colonel, NP    Encouraged to use CPAP as directed.      Ankle pain, left   Last Assessment & Plan 02/03/2011 Office Visit Written 02/06/2011  2:52 PM by Fayrene Helper, MD    Acute flair, short indocid taper      Poor appetite   Last Assessment & Plan 07/12/2011 Office Visit Written 08/10/2011  8:29 AM by Fayrene Helper, MD  Symptomatic gallstones, will refer to surgery for further eval. May need cholecystectomy       Change in bowel movement   Pulmonary nodule   Last Assessment & Plan 07/18/2011 Office Visit Written 07/20/2011 12:15 PM by Fayrene Helper, MD    Nicotine x 40 plus years, needs chest Ct      Dyspnea   Moderate dehydration   Last Assessment & Plan 07/25/2011 Office Visit Written 07/25/2011  4:33 PM by Renella Cunas, MD    He is clearly prerenal. I've advised him to stop his metolazone. His weight range to be  237-43. He will weigh himself daily. I've asked him to drink more water the next few days until weight up  4 pounds. We talked about how to avoid syncope. I discontinued his Hytrin since he started on an alpha-blocker and he has systolic heart failure now. In addition he does not have a prostate issue.  His weight is 02/18/1943 he will take metolazone on a when necessary basis. Otherwise he will stay on Demadex 100 twice a day.  I have him followup with me on July 22 were close followup. We'll check electrolytes then. I am sure his BUN and creatinine are extremely high again.      Chest pain   Last Assessment & Plan 02/26/2014 Office Visit Written 02/26/2014  2:10 PM by Imogene Burn, PA-C    Patient has had some chest tightness with the palpitations. He is a diabetic, hypertensive and  smokes a pack of cigarettes a day. It's been 6 years since he's been evaluated with a stress test. Check stress Myoview. Follow-up with Dr. Bronson Ing in 2 weeks.      Hypokalemia   Palpitations   Last Assessment & Plan 02/26/2014 Office Visit Written 02/26/2014  2:08 PM by Imogene Burn, PA-C    Patient awakened with frequent palpitations associated with increased dyspnea. He does have evidence of heart failure on exam today. It did improve with taking metolazone. He is having frequent PVCs. We'll check bmet. Consider monitor if it doesn't settle down.          Imaging: Nm Myocar Multi W/spect W/wall Motion / Ef  03/07/2014   CLINICAL DATA:  69 year old patient with chronic systolic heart failure, hypertension, diabetes, sleep apnea, and stage 4 chronic kidney disease with worsening exertional dyspnea and referred for an ischemic evaluation.  EXAM: MYOCARDIAL IMAGING WITH SPECT (REST AND PHARMACOLOGIC-STRESS)  GATED LEFT VENTRICULAR WALL MOTION STUDY  LEFT VENTRICULAR EJECTION FRACTION  TECHNIQUE: Standard myocardial SPECT imaging was performed after resting intravenous injection of 10 mCi Tc-51m sestamibi. Subsequently, intravenous infusion of Lexiscan was performed under the supervision of the Cardiology staff. At peak effect of the drug, 30 mCi Tc-86m sestamibi was injected intravenously and standard myocardial SPECT imaging was performed. Quantitative gated imaging was also performed to evaluate left ventricular wall motion, and estimate left ventricular ejection fraction.  COMPARISON:  None.  FINDINGS: Stress/ECG data: The patient was initially stressed with the Bruce protocol but was unable to achieve an adequate heart rate and less it was switched to a Lexiscan. The heart rate ranged from 75 to 120 beats per min. The blood pressure ranged from 170/53 up to 200/57.  Resting ECG showed sinus rhythm with a left bundle branch block which persisted throughout the study. PVCs were noted in recovery.  This is a nondiagnostic ECG.  Perfusion: There is a moderate sized area of mildly decreased uptake in the inferior wall extending from the  apex to the base. Images are worse on rest than stress. While this is suggestive of inferior wall scar, overlying soft tissue attenuation cannot entirely be ruled out.  Wall Motion: Severe global hypokinesis with the inferior wall more akinetic. Left ventricle is dilated at stress and rest.  Left Ventricular Ejection Fraction: 20 %  End diastolic volume 030 ml  End systolic volume 092 ml  IMPRESSION: 1. Moderate degree of inferior wall scar. However, overlying soft tissue attenuation cannot entirely be ruled out.  2. Severe global hypokinesis with the inferior wall more akinetic.  3. Left ventricular ejection fraction 20%  4. High-risk stress test findings* primarily based on severe left ventricular dysfunction.  *2012 Appropriate Use Criteria for Coronary Revascularization Focused Update: J Am Coll Cardiol. 3300;76(2):263-335. http://content.airportbarriers.com.aspx?articleid=[email or web address]   Electronically Signed   By: Kate Sable   On: 03/07/2014 19:50

## 2014-03-25 ENCOUNTER — Telehealth: Payer: Self-pay | Admitting: *Deleted

## 2014-03-25 NOTE — Telephone Encounter (Signed)
Discussed with Ms Purcell Nails NP, and she wants patient to wait to return until after his visit with Dr Bronson Ing on 04/28/14 at 10 am .She is concerned because he is wearing the [employer]

## 2014-03-25 NOTE — Telephone Encounter (Signed)
Pt was seen yesterday and wants to know if he can go back to work on light duty

## 2014-03-25 NOTE — Telephone Encounter (Signed)
I spoke with patient and he agreed with plan.

## 2014-03-26 ENCOUNTER — Encounter: Payer: Medicare Other | Admitting: Physician Assistant

## 2014-03-28 ENCOUNTER — Ambulatory Visit (INDEPENDENT_AMBULATORY_CARE_PROVIDER_SITE_OTHER): Payer: Medicare Other | Admitting: Family Medicine

## 2014-03-28 ENCOUNTER — Encounter: Payer: Self-pay | Admitting: Family Medicine

## 2014-03-28 VITALS — BP 120/48 | HR 64 | Temp 97.6°F | Resp 18 | Ht 71.0 in | Wt 197.0 lb

## 2014-03-28 DIAGNOSIS — E119 Type 2 diabetes mellitus without complications: Secondary | ICD-10-CM | POA: Diagnosis not present

## 2014-03-28 DIAGNOSIS — Z09 Encounter for follow-up examination after completed treatment for conditions other than malignant neoplasm: Secondary | ICD-10-CM | POA: Diagnosis not present

## 2014-03-28 DIAGNOSIS — Z794 Long term (current) use of insulin: Secondary | ICD-10-CM

## 2014-03-28 DIAGNOSIS — IMO0001 Reserved for inherently not codable concepts without codable children: Secondary | ICD-10-CM

## 2014-03-28 LAB — BASIC METABOLIC PANEL
BUN: 81 mg/dL — ABNORMAL HIGH (ref 6–23)
CHLORIDE: 96 meq/L (ref 96–112)
CO2: 26 mEq/L (ref 19–32)
Calcium: 8.9 mg/dL (ref 8.4–10.5)
Creat: 4.36 mg/dL — ABNORMAL HIGH (ref 0.50–1.35)
Glucose, Bld: 242 mg/dL — ABNORMAL HIGH (ref 70–99)
POTASSIUM: 3.4 meq/L — AB (ref 3.5–5.3)
Sodium: 137 mEq/L (ref 135–145)

## 2014-03-28 MED ORDER — INSULIN GLARGINE 100 UNIT/ML SOLOSTAR PEN: 30.0000 [IU] | PEN_INJECTOR | Freq: Every day | SUBCUTANEOUS | Status: DC

## 2014-03-28 NOTE — Progress Notes (Signed)
Subjective:    Patient ID: [name], male    DOB: January 28, 1945, 69 y.o.   MRN: [id number]  HPI Patient was recently admitted to the hospital and underwent catheterization to evaluate for congestive heart failure. Patient was found to have patent coronary arteries. He was subsequently diagnosed with nonischemic cardiomyopathy with an ejection fraction of 20-25%. Due to frequent PVCs and SVT, the patient was switched from metoprolol to carvedilol. He was also started on isosorbide dinitrate in addition to his hydralazine to maximize his therapy for congestive heart failure. Patient was monitored in the hospital due to his chronic kidney disease and the fear of acute tubular necrosis from catheterization contrast. Patient tolerated the catheterization well and was discharged home from the hospital. He is currently wearing a LifeVest. At the time of his discharge he was unsure about having an ICD. He is now more amenable to the procedure and would like to talk to Dr. Lovena Le about his options. He denies any shortness of breath however he does occasionally have palpitations in his chest and occasional chest pain. He denies any shocks. Unfortunately his fasting blood sugar can range as high as 300-400. He is currently on Lantus. However the patient is not taking a standard dose. He was taking 10 units a day. He is now taking anywhere from 10-30 units a day depending on what his blood sugar is.  He denies any hypoglycemic episodes.  Past Medical History  Diagnosis Date  . Chronic combined systolic and diastolic CHF (congestive heart failure)     a. 02/2014 Echo: EF 20-25% (new), Gr 2 DD, mod conc LVH, mild AI/AS, mod MR, sev dil LA, mild TR.  Marland Kitchen Nonischemic cardiomyopathy     a. 02/2014 Echo: EF 20-25%;  b. 03/2014 Cath: LM nl, LAD min irregs, LCX   . 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 Hypertension   . Chronic kidney disease (CKD), stage IV (severe) 12/2009    a. baseline  creat now ~ 4.  . Erectile dysfunction   . Colonic polyp   . Low serum testosterone level   . Tobacco abuse   . Sleep apnea 2010    Initially unable to afford CPAP  . Benign prostatic hypertrophy   . Obesity, Class I, BMI 30-34.9   . Hyperlipidemia   . Congestive heart failure (CHF)     EF 25-30% 07/2011  . Adenomatous colon polyp 2006  . H. pylori infection 2013    treated with prevpac  . Mild Ao Stenosis and Insufficiency    Past Surgical History  Procedure Laterality Date  . Retinal laser procedure      diabetic retinopathy  . Lymph node biopsy      surgical exploration of neck-not entirely clear that this represented a lymph node biopsy  . Colonoscopy  08/23/2004    Dr. Vivi Ferns rectum, diminutive polyp of the rectosigmoid removed, inflamed focally adenomatous polyp  . Left and right heart catheterization with coronary angiogram N/A 03/19/2014    Procedure: LEFT AND RIGHT HEART CATHETERIZATION WITH CORONARY ANGIOGRAM;  Surgeon: Blane Ohara, MD;  Location: [facility] CATH LAB;  Service: Cardiovascular;  Laterality: N/A;   Current Outpatient Prescriptions on File Prior to Visit  Medication Sig Dispense Refill  . amLODipine (NORVASC) 10 MG tablet Take 1 tablet (10 mg total) by mouth daily. 30 tablet 6  . aspirin EC 81 MG tablet Take 81 mg by mouth daily.    Marland Kitchen  carvedilol (COREG) 12.5 MG tablet Take 1 tablet (12.5 mg total) by mouth 2 (two) times daily with a meal. 180 tablet 3  . cloNIDine (CATAPRES) 0.3 MG tablet Take 0.3 mg by mouth 2 (two) times daily.     . diphenhydramine-acetaminophen (TYLENOL PM) 25-500 MG TABS Take 1 tablet by mouth at bedtime as needed (sleep/ pain).    . febuxostat (ULORIC) 40 MG tablet Take 1 tablet (40 mg total) by mouth daily. 30 tablet 2  . hydrALAZINE (APRESOLINE) 50 MG tablet Take 1 tablet (50 mg total) by mouth 2 (two) times daily. 180 tablet 3  . isosorbide dinitrate (ISORDIL) 10 MG tablet Take 1 tablet (10 mg total) by mouth 3 (three) times daily.  90 tablet 3  . metolazone (ZAROXOLYN) 2.5 MG tablet 2.5 mg Daily x 3 days and then 2.5 mg every Monday, Wednesday, and Friday. 30 tablet 3  . ONE TOUCH ULTRA TEST test strip     . potassium chloride SA (K-DUR,KLOR-CON) 20 MEQ tablet Take 1 tablet (20 mEq total) by mouth 3 (three) times daily. 90 tablet 3  . torsemide (DEMADEX) 100 MG tablet Take 100 mg by mouth 2 (two) times daily.      No current facility-administered medications on file prior to visit.   Allergies  Allergen Reactions  . Ace Inhibitors Cough   History   Social History  . Marital Status: Married    Spouse Name: N/A  . Number of Children: 4  . Years of Education: N/A   Occupational History  . Semi - Retired Administrator    Social History Main Topics  . Smoking status: Current Every Day Smoker -- 0.25 packs/day for 45 years    Types: Cigarettes    Start date: 05/31/1958  . Smokeless tobacco: Current User     Comment: 1-2 cigarettes OF THE NEW ELECTRIC CIGARETTES  . Alcohol Use: 2.4 oz/week    2 Glasses of wine, 2 Shots of liquor per week     Comment: occ  . Drug Use: No  . Sexual Activity: Not on file   Other Topics Concern  . Not on file   Social History Narrative      Review of Systems  All other systems reviewed and are negative.      Objective:   Physical Exam  Constitutional: He appears well-developed and well-nourished. No distress.  Neck: Neck supple. No JVD present.  Cardiovascular: Normal rate, regular rhythm and normal heart sounds.   No murmur heard. Pulmonary/Chest: Effort normal and breath sounds normal. No respiratory distress. He has no wheezes. He has no rales.  Musculoskeletal: He exhibits no edema.  Lymphadenopathy:    He has no cervical adenopathy.  Skin: He is not diaphoretic.  Vitals reviewed.         Assessment & Plan:  IDDM (insulin dependent diabetes mellitus) - Plan: Insulin Glargine (LANTUS SOLOSTAR) 100 UNIT/ML Solostar Pen, Basic Metabolic Panel  Hospital  discharge follow-up - Plan: Ambulatory referral to Cardiology  I had a long discussion with the patient and he is now willing to meet with Dr. Lovena Le to discuss ICD. Therefore I will try to expedite his appointment with Dr. Lovena Le. I have recommended he start taking 15 units of Lantus every day and increase by 1 unit until fasting blood sugars are consistently less than 200. I explained that just ignored his insulin and taking random dosages could be dangerous. I will also recheck his creatinine today after his recent hospitalization to make sure  there has been no contrast-induced nephropathy. [name] blood pressureis adequately controlled. His heart rate is adequately controlled.Marland Kitchen

## 2014-04-01 ENCOUNTER — Encounter (HOSPITAL_COMMUNITY)
Admission: RE | Admit: 2014-04-01 | Discharge: 2014-04-01 | Disposition: A | Discharge status: Home / Self Care | Payer: Medicare Other | Source: Ambulatory Visit | Attending: Nephrology | Admitting: Nephrology

## 2014-04-01 DIAGNOSIS — D638 Anemia in other chronic diseases classified elsewhere: Secondary | ICD-10-CM | POA: Insufficient documentation

## 2014-04-01 DIAGNOSIS — N184 Chronic kidney disease, stage 4 (severe): Secondary | ICD-10-CM | POA: Insufficient documentation

## 2014-04-01 LAB — HEMOGLOBIN AND HEMATOCRIT, BLOOD
HCT: 32.1 % — ABNORMAL LOW (ref 39.0–52.0)
Hemoglobin: 10.9 g/dL — ABNORMAL LOW (ref 13.0–17.0)

## 2014-04-01 MED ORDER — DARBEPOETIN ALFA 100 MCG/0.5ML IJ SOSY
PREFILLED_SYRINGE | INTRAMUSCULAR | Status: AC
  Filled 2014-04-01: qty 0.5

## 2014-04-01 MED ORDER — DARBEPOETIN ALFA 100 MCG/0.5ML IJ SOSY
100.0000 ug | PREFILLED_SYRINGE | INTRAMUSCULAR | Status: DC
  Administered 2014-04-01: 100 ug via SUBCUTANEOUS

## 2014-04-01 NOTE — Progress Notes (Signed)
Results for GARO, HEIDELBERG (MRN [id number]) as of 04/01/2014 10:49  Ref. Range 04/01/2014 08:20  Hemoglobin Latest Range: 13.0-17.0 g/dL 10.9 (L)  HCT Latest Range: 39.0-52.0 % 32.1 (L)  Aranesp 153mcg subq administered.

## 2014-04-02 ENCOUNTER — Institutional Professional Consult (permissible substitution): Payer: Medicare Other | Admitting: Internal Medicine

## 2014-04-03 DIAGNOSIS — N185 Chronic kidney disease, stage 5: Secondary | ICD-10-CM | POA: Diagnosis not present

## 2014-04-03 DIAGNOSIS — D631 Anemia in chronic kidney disease: Secondary | ICD-10-CM | POA: Diagnosis not present

## 2014-04-03 DIAGNOSIS — I120 Hypertensive chronic kidney disease with stage 5 chronic kidney disease or end stage renal disease: Secondary | ICD-10-CM | POA: Diagnosis not present

## 2014-04-03 DIAGNOSIS — R809 Proteinuria, unspecified: Secondary | ICD-10-CM | POA: Diagnosis not present

## 2014-04-14 ENCOUNTER — Telehealth: Payer: Self-pay | Admitting: Family Medicine

## 2014-04-14 NOTE — Telephone Encounter (Signed)
Carvedilol is not likely causing this.

## 2014-04-14 NOTE — Telephone Encounter (Signed)
?   Can his BP med make his BS go up?

## 2014-04-14 NOTE — Telephone Encounter (Signed)
Patient is calling to say that the carvedilol is making her blood sugar go up much higher, would like a call back as to what to do about this  [phone]

## 2014-04-15 MED ORDER — ONETOUCH ULTRA BLUE VI STRP: ORAL_STRIP | Status: DC

## 2014-04-15 NOTE — Telephone Encounter (Signed)
Called and spoke to pt - he needed [place] to send him in a new rx for his test strips as his BS have been running a little higher then normal and he has been checking it a little more then usual. He needs it to say check 6 times per day instead of 4. RX sent to pharm for 6 times a day.

## 2014-04-20 DIAGNOSIS — I420 Dilated cardiomyopathy: Secondary | ICD-10-CM | POA: Diagnosis not present

## 2014-04-20 DIAGNOSIS — I252 Old myocardial infarction: Secondary | ICD-10-CM | POA: Diagnosis not present

## 2014-04-20 DIAGNOSIS — I509 Heart failure, unspecified: Secondary | ICD-10-CM | POA: Diagnosis not present

## 2014-04-28 ENCOUNTER — Encounter: Payer: Self-pay | Admitting: Cardiovascular Disease

## 2014-04-28 ENCOUNTER — Ambulatory Visit (INDEPENDENT_AMBULATORY_CARE_PROVIDER_SITE_OTHER): Payer: Medicare Other | Admitting: Cardiovascular Disease

## 2014-04-28 VITALS — BP 134/50 | HR 64 | Ht 70.0 in | Wt 197.8 lb

## 2014-04-28 DIAGNOSIS — I10 Essential (primary) hypertension: Secondary | ICD-10-CM | POA: Diagnosis not present

## 2014-04-28 DIAGNOSIS — I359 Nonrheumatic aortic valve disorder, unspecified: Secondary | ICD-10-CM

## 2014-04-28 DIAGNOSIS — I351 Nonrheumatic aortic (valve) insufficiency: Secondary | ICD-10-CM

## 2014-04-28 DIAGNOSIS — Z716 Tobacco abuse counseling: Secondary | ICD-10-CM

## 2014-04-28 DIAGNOSIS — I420 Dilated cardiomyopathy: Secondary | ICD-10-CM | POA: Diagnosis not present

## 2014-04-28 DIAGNOSIS — I519 Heart disease, unspecified: Secondary | ICD-10-CM | POA: Diagnosis not present

## 2014-04-28 DIAGNOSIS — I429 Cardiomyopathy, unspecified: Secondary | ICD-10-CM | POA: Diagnosis not present

## 2014-04-28 DIAGNOSIS — I340 Nonrheumatic mitral (valve) insufficiency: Secondary | ICD-10-CM

## 2014-04-28 DIAGNOSIS — N184 Chronic kidney disease, stage 4 (severe): Secondary | ICD-10-CM

## 2014-04-28 DIAGNOSIS — I5032 Chronic diastolic (congestive) heart failure: Secondary | ICD-10-CM

## 2014-04-28 DIAGNOSIS — D631 Anemia in chronic kidney disease: Secondary | ICD-10-CM

## 2014-04-28 DIAGNOSIS — Z9289 Personal history of other medical treatment: Secondary | ICD-10-CM

## 2014-04-28 DIAGNOSIS — I493 Ventricular premature depolarization: Secondary | ICD-10-CM

## 2014-04-28 DIAGNOSIS — N189 Chronic kidney disease, unspecified: Secondary | ICD-10-CM

## 2014-04-28 DIAGNOSIS — I5022 Chronic systolic (congestive) heart failure: Secondary | ICD-10-CM

## 2014-04-28 DIAGNOSIS — Z87898 Personal history of other specified conditions: Secondary | ICD-10-CM

## 2014-04-28 DIAGNOSIS — E785 Hyperlipidemia, unspecified: Secondary | ICD-10-CM

## 2014-04-28 NOTE — Patient Instructions (Addendum)
Your physician recommends that you schedule a follow-up appointment in: 3-4 months with Dr.Koneswaran  Stop Isosorbide as it is causing you headaches     Coralyn Mark will assist you in getting a sooner apt with Dr Lovena Le     Thank you for choosing [facility] !

## 2014-04-28 NOTE — Progress Notes (Signed)
Patient ID: [name], male   DOB: 02-Jul-1945, 69 y.o.   MRN: [id number]      SUBJECTIVE: The patient presents for routine cardiovascular follow-up. He has a history of a nonischemic cardiomyopathy with severe left ventricular systolic dysfunction and chronic systolic heart failure, along with stage IV chronic kidney disease and essential hypertension. He is wearing a LifeVest for frequent PVC's and nonsustained ventricular tachycardia. Echo also showed grade II diastolic dysfunction, mild aortic regurgitation and mild aortic stenosis with moderate mitral regurgitation and severe left atrial dilatation. Right ventricular systolic function was mildly reduced with mild right atrial dilatation, a PFO, and mild tricuspid regurgitation. He has a left bundle branch block.  He denies chest pain, palpitations, leg swelling, dizziness, and shortness of breath. He was told by the LifeVest technician that he has not sustained any arrhythmias since wearing the vest. He has several questions with regards to his medications and his upcoming electrophysiology appointment.  He continues to smoke up to a half pack of cigarettes daily. He wants to go back to work part time but the [employer] will not allow him due to his insulin use.  Review of Systems: As per "subjective", otherwise negative.  Allergies  Allergen Reactions  . Ace Inhibitors Cough    Current Outpatient Prescriptions  Medication Sig Dispense Refill  . amLODipine (NORVASC) 10 MG tablet Take 1 tablet (10 mg total) by mouth daily. 30 tablet 6  . aspirin EC 81 MG tablet Take 81 mg by mouth daily.    . carvedilol (COREG) 12.5 MG tablet Take 1 tablet (12.5 mg total) by mouth 2 (two) times daily with a meal. 180 tablet 3  . cloNIDine (CATAPRES) 0.3 MG tablet Take 0.3 mg by mouth 2 (two) times daily.     . diphenhydramine-acetaminophen (TYLENOL PM) 25-500 MG TABS Take 1 tablet by mouth at bedtime as needed (sleep/ pain).    . febuxostat (ULORIC) 40 MG  tablet Take 1 tablet (40 mg total) by mouth daily. 30 tablet 2  . hydrALAZINE (APRESOLINE) 50 MG tablet Take 1 tablet (50 mg total) by mouth 2 (two) times daily. 180 tablet 3  . Insulin Glargine (LANTUS SOLOSTAR) 100 UNIT/ML Solostar Pen Inject 30 Units into the skin daily at 10 pm. 5 pen 3  . isosorbide dinitrate (ISORDIL) 10 MG tablet Take 1 tablet (10 mg total) by mouth 3 (three) times daily. 90 tablet 3  . metolazone (ZAROXOLYN) 2.5 MG tablet 2.5 mg Daily x 3 days and then 2.5 mg every Monday, Wednesday, and Friday. 30 tablet 3  . ONE TOUCH ULTRA TEST test strip Pt checks BS 6 x day - e11.9 200 each 5  . potassium chloride SA (K-DUR,KLOR-CON) 20 MEQ tablet Take 1 tablet (20 mEq total) by mouth 3 (three) times daily. 90 tablet 3  . torsemide (DEMADEX) 100 MG tablet Take 100 mg by mouth 2 (two) times daily.      No current facility-administered medications for this visit.    Past Medical History  Diagnosis Date  . Chronic combined systolic and diastolic CHF (congestive heart failure)     a. 02/2014 Echo: EF 20-25% (new), Gr 2 DD, mod conc LVH, mild AI/AS, mod MR, sev dil LA, mild TR.  Marland Kitchen Nonischemic cardiomyopathy     a. 02/2014 Echo: EF 20-25%;  b. 03/2014 Cath: LM nl, LAD min irregs, LCX   . 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 Hypertension   .  Chronic kidney disease (CKD), stage IV (severe) 12/2009    a. baseline creat now ~ 4.  . Erectile dysfunction   . Colonic polyp   . Low serum testosterone level   . Tobacco abuse   . Sleep apnea 2010    Initially unable to afford CPAP  . Benign prostatic hypertrophy   . Obesity, Class I, BMI 30-34.9   . Hyperlipidemia   . Congestive heart failure (CHF)     EF 25-30% 07/2011  . Adenomatous colon polyp 2006  . H. pylori infection 2013    treated with prevpac  . Mild Ao Stenosis and Insufficiency     Past Surgical History  Procedure Laterality Date  . Retinal laser procedure      diabetic retinopathy  .  Lymph node biopsy      surgical exploration of neck-not entirely clear that this represented a lymph node biopsy  . Colonoscopy  08/23/2004    Dr. Vivi Ferns rectum, diminutive polyp of the rectosigmoid removed, inflamed focally adenomatous polyp  . Left and right heart catheterization with coronary angiogram N/A 03/19/2014    Procedure: LEFT AND RIGHT HEART CATHETERIZATION WITH CORONARY ANGIOGRAM;  Surgeon: Blane Ohara, MD;  Location: [facility] CATH LAB;  Service: Cardiovascular;  Laterality: N/A;    History   Social History  . Marital Status: Married    Spouse Name: N/A  . Number of Children: 4  . Years of Education: N/A   Occupational History  . Semi - Retired Administrator    Social History Main Topics  . Smoking status: Current Every Day Smoker -- 0.25 packs/day for 45 years    Types: Cigarettes    Start date: 05/31/1958  . Smokeless tobacco: Current User     Comment: 1-2 cigarettes OF THE NEW ELECTRIC CIGARETTES  . Alcohol Use: 2.4 oz/week    2 Glasses of wine, 2 Shots of liquor per week     Comment: occ  . Drug Use: No  . Sexual Activity: Not on file   Other Topics Concern  . Not on file   Social History Narrative     Filed Vitals:   04/28/14 0944  BP: 134/50  Pulse: 64  Height: 5\' 10"  (1.778 m)  Weight: 197 lb 12.8 oz (89.721 kg)  SpO2: 99%    PHYSICAL EXAM General: NAD HEENT: Normal. Neck: No JVD, no thyromegaly. Lungs: Clear to auscultation bilaterally with normal respiratory effort. CV: Nondisplaced PMI.  Regular rate and rhythm, normal S1/S2, no S3/S4, II/VI ejection systolic murmur over RUSB and I/IV holodiastolic murmur along left sternal border. No pretibial or periankle edema.  No carotid bruit.    Abdomen: Soft, nontender, no hepatosplenomegaly, no distention.  Neurologic: Alert and oriented x 3.  Psych: Somewhat flat affect. Skin: Normal. Musculoskeletal: No gross deformities. Extremities: No clubbing or cyanosis.   ECG: Most recent ECG  reviewed.      ASSESSMENT AND PLAN: 1. Chronic systolic heart failure/nonischemic cardiomyopathy with severely reduced LV systolic function, EF 02-54%: Euvolemic today, but with significant decline in LV systolic function which had normalized in 06/2012. Continue torsemide 100 mg bid (evening dose at 4 pm to help with sleep) and metolazone as directed by nephrology (three days per week). He is not a candidate for spironolactone or ACEI/ARB due to severe CKD. He is on hydralazine but has been bothered by headaches with nitrates, which I will discontinue. Continue ASA and carvedilol. He is scheduled for EP consultation for BiV-ICD consideration. We had a lengthy  discussion with regards to indications and benefits of device placement.  2. Essential HTN: Well controlled. No changes.  3. Valvular heart disease with moderate mitral regurgitation and mild aortic stenosis and regurgitation: Symptomatically stable. Medications noted above.  4. CKD stage 4: Stable. Followed by nephrology.   5. Anemia secondary to CKD: Hgb 10.9 on 04/02/14. Receives EPO injections.  6. Tobacco abuse: Cessation counseling provided. Continues to smoke 0.5 ppd.  7. Frequent PVC's/NSVT and severely reduced LV systolic function: Wearing LifeVest. I will have EP evaluate him for BiV-ICD consideration.  Dispo: f/u 3 months.  Time spent: 40 minutes, of which greater than 50% was spent reviewing symptoms, relevant blood tests and studies, and discussing management plan with the patient.   Kate Sable, M.D., F.A.C.C.

## 2014-04-29 ENCOUNTER — Encounter (HOSPITAL_COMMUNITY)
Admission: RE | Admit: 2014-04-29 | Discharge: 2014-04-29 | Disposition: A | Discharge status: Home / Self Care | Payer: Medicare Other | Source: Ambulatory Visit | Attending: Nephrology | Admitting: Nephrology

## 2014-04-29 DIAGNOSIS — D638 Anemia in other chronic diseases classified elsewhere: Secondary | ICD-10-CM | POA: Diagnosis not present

## 2014-04-29 DIAGNOSIS — N184 Chronic kidney disease, stage 4 (severe): Secondary | ICD-10-CM | POA: Diagnosis not present

## 2014-04-29 LAB — HEMOGLOBIN AND HEMATOCRIT, BLOOD
HCT: 33.4 % — ABNORMAL LOW (ref 39.0–52.0)
HEMOGLOBIN: 11.5 g/dL — AB (ref 13.0–17.0)

## 2014-04-29 MED ORDER — DARBEPOETIN ALFA 100 MCG/0.5ML IJ SOSY
100.0000 ug | PREFILLED_SYRINGE | INTRAMUSCULAR | Status: DC
Start: 2014-04-29 — End: 2014-05-01
  Administered 2014-04-29: 100 ug via SUBCUTANEOUS
  Filled 2014-04-29: qty 0.5

## 2014-04-29 NOTE — Progress Notes (Signed)
hgb 11.5 therefore aranesp 100 mcg given per order. Lab results faxed to Dr Mercy Moore.

## 2014-05-08 ENCOUNTER — Encounter: Payer: Self-pay | Admitting: Family Medicine

## 2014-05-08 ENCOUNTER — Ambulatory Visit (INDEPENDENT_AMBULATORY_CARE_PROVIDER_SITE_OTHER): Payer: Medicare Other | Admitting: Family Medicine

## 2014-05-08 VITALS — BP 140/62 | HR 80 | Temp 97.9°F | Resp 18 | Ht 71.0 in | Wt 194.0 lb

## 2014-05-08 DIAGNOSIS — Z794 Long term (current) use of insulin: Secondary | ICD-10-CM

## 2014-05-08 DIAGNOSIS — E785 Hyperlipidemia, unspecified: Secondary | ICD-10-CM

## 2014-05-08 DIAGNOSIS — E119 Type 2 diabetes mellitus without complications: Secondary | ICD-10-CM

## 2014-05-08 DIAGNOSIS — I10 Essential (primary) hypertension: Secondary | ICD-10-CM

## 2014-05-08 DIAGNOSIS — IMO0001 Reserved for inherently not codable concepts without codable children: Secondary | ICD-10-CM

## 2014-05-08 LAB — CBC WITH DIFFERENTIAL/PLATELET
BASOS ABS: 0.1 10*3/uL (ref 0.0–0.1)
BASOS PCT: 1 % (ref 0–1)
Eosinophils Absolute: 0.6 10*3/uL (ref 0.0–0.7)
Eosinophils Relative: 8 % — ABNORMAL HIGH (ref 0–5)
HEMATOCRIT: 37.7 % — AB (ref 39.0–52.0)
Hemoglobin: 12.9 g/dL — ABNORMAL LOW (ref 13.0–17.0)
Lymphocytes Relative: 14 % (ref 12–46)
Lymphs Abs: 1 10*3/uL (ref 0.7–4.0)
MCH: 30.4 pg (ref 26.0–34.0)
MCHC: 34.2 g/dL (ref 30.0–36.0)
MCV: 88.9 fL (ref 78.0–100.0)
MPV: 8.2 fL — AB (ref 8.6–12.4)
Monocytes Absolute: 0.8 10*3/uL (ref 0.1–1.0)
Monocytes Relative: 11 % (ref 3–12)
NEUTROS ABS: 4.6 10*3/uL (ref 1.7–7.7)
Neutrophils Relative %: 66 % (ref 43–77)
PLATELETS: 255 10*3/uL (ref 150–400)
RBC: 4.24 MIL/uL (ref 4.22–5.81)
RDW: 16.4 % — ABNORMAL HIGH (ref 11.5–15.5)
WBC: 7 10*3/uL (ref 4.0–10.5)

## 2014-05-08 LAB — HEMOGLOBIN A1C
Hgb A1c MFr Bld: 6.1 % — ABNORMAL HIGH (ref <5.7)
Mean Plasma Glucose: 128 mg/dL — ABNORMAL HIGH (ref <117)

## 2014-05-08 NOTE — Progress Notes (Signed)
Subjective:    Patient ID: [name], male    DOB: January 14, 1946, 69 y.o.   MRN: [id number]  Diabetes  03/28/14 Patient was recently admitted to the hospital and underwent catheterization to evaluate for congestive heart failure. Patient was found to have patent coronary arteries. He was subsequently diagnosed with nonischemic cardiomyopathy with an ejection fraction of 20-25%. Due to frequent PVCs and SVT, the patient was switched from metoprolol to carvedilol. He was also started on isosorbide dinitrate in addition to his hydralazine to maximize his therapy for congestive heart failure. Patient was monitored in the hospital due to his chronic kidney disease and the fear of acute tubular necrosis from catheterization contrast. Patient tolerated the catheterization well and was discharged home from the hospital. He is currently wearing a LifeVest. At the time of his discharge he was unsure about having an ICD. He is now more amenable to the procedure and would like to talk to Dr. Lovena Le about his options. He denies any shortness of breath however he does occasionally have palpitations in his chest and occasional chest pain. He denies any shocks. Unfortunately his fasting blood sugar can range as high as 300-400. He is currently on Lantus. However the patient is not taking a standard dose. He was taking 10 units a day. He is now taking anywhere from 10-30 units a day depending on what his blood sugar is.  He denies any hypoglycemic episodes.  At that time, my plan was: I had a long discussion with the patient and he is now willing to meet with Dr. Lovena Le to discuss ICD. Therefore I will try to expedite his appointment with Dr. Lovena Le. I have recommended he start taking 15 units of Lantus every day and increase by 1 unit until fasting blood sugars are consistently less than 200. I explained that just ignored his insulin and taking random dosages could be dangerous. I will also recheck his creatinine today  after his recent hospitalization to make sure there has been no contrast-induced nephropathy. [name] blood pressureis adequately controlled. His heart rate is adequately controlled..   05/08/14 Patient is here today for a diabetic examination. However over 20 minutes was spent discussing the patient's cardiac status in detail with him. The patient seems quite confused. He is also very upset and agitated. Apparently his insurance is not paying for his LifeVest and he will be required to pay close to $1500. He also does not seem to understand the gravity of his health condition. He continues to question why he would require ICD even though I personally and I know his cardiologist explained to him in detail the risk of sudden cardiac death due to ventricular arrhythmia. I again spent 15-20 minutes today discussing the implications of his health problems. The patient is at risk for sudden cardiac death. He would benefit from an ICD. If he does not wear the LifeVest he runs it an extremely high risk of sudden cardiac death.  I also recommended that he continue to wear the vest until both he and his cardiologist make a decision regarding an implantable cardiac defibrillator.  Patient is due for hemoglobin A1c as well as a diabetic foot exam. His diabetic foot exam is performed today and is completely normal Past Medical History  Diagnosis Date  . Chronic combined systolic and diastolic CHF (congestive heart failure)     a. 02/2014 Echo: EF 20-25% (new), Gr 2 DD, mod conc LVH, mild AI/AS, mod MR, sev dil LA, mild TR.  Marland Kitchen  Nonischemic cardiomyopathy     a. 02/2014 Echo: EF 20-25%;  b. 03/2014 Cath: LM nl, LAD min irregs, LCX   . 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 Hypertension   . Chronic kidney disease (CKD), stage IV (severe) 12/2009    a. baseline creat now ~ 4.  . Erectile dysfunction   . Colonic polyp   . Low serum testosterone level   . Tobacco abuse   . Sleep apnea 2010      Initially unable to afford CPAP  . Benign prostatic hypertrophy   . Obesity, Class I, BMI 30-34.9   . Hyperlipidemia   . Congestive heart failure (CHF)     EF 25-30% 07/2011  . Adenomatous colon polyp 2006  . H. pylori infection 2013    treated with prevpac  . Mild Ao Stenosis and Insufficiency    Past Surgical History  Procedure Laterality Date  . Retinal laser procedure      diabetic retinopathy  . Lymph node biopsy      surgical exploration of neck-not entirely clear that this represented a lymph node biopsy  . Colonoscopy  08/23/2004    Dr. Vivi Ferns rectum, diminutive polyp of the rectosigmoid removed, inflamed focally adenomatous polyp  . Left and right heart catheterization with coronary angiogram N/A 03/19/2014    Procedure: LEFT AND RIGHT HEART CATHETERIZATION WITH CORONARY ANGIOGRAM;  Surgeon: Blane Ohara, MD;  Location: [facility] CATH LAB;  Service: Cardiovascular;  Laterality: N/A;   Current Outpatient Prescriptions on File Prior to Visit  Medication Sig Dispense Refill  . amLODipine (NORVASC) 10 MG tablet Take 1 tablet (10 mg total) by mouth daily. 30 tablet 6  . aspirin EC 81 MG tablet Take 81 mg by mouth daily.    . carvedilol (COREG) 12.5 MG tablet Take 1 tablet (12.5 mg total) by mouth 2 (two) times daily with a meal. 180 tablet 3  . cloNIDine (CATAPRES) 0.3 MG tablet Take 0.3 mg by mouth 2 (two) times daily.     . diphenhydramine-acetaminophen (TYLENOL PM) 25-500 MG TABS Take 1 tablet by mouth at bedtime as needed (sleep/ pain).    . febuxostat (ULORIC) 40 MG tablet Take 1 tablet (40 mg total) by mouth daily. 30 tablet 2  . hydrALAZINE (APRESOLINE) 50 MG tablet Take 1 tablet (50 mg total) by mouth 2 (two) times daily. 180 tablet 3  . Insulin Glargine (LANTUS SOLOSTAR) 100 UNIT/ML Solostar Pen Inject 30 Units into the skin daily at 10 pm. 5 pen 3  . metolazone (ZAROXOLYN) 2.5 MG tablet 2.5 mg Daily x 3 days and then 2.5 mg every Monday, Wednesday, and Friday. 30  tablet 3  . ONE TOUCH ULTRA TEST test strip Pt checks BS 6 x day - e11.9 200 each 5  . potassium chloride SA (K-DUR,KLOR-CON) 20 MEQ tablet Take 1 tablet (20 mEq total) by mouth 3 (three) times daily. 90 tablet 3  . torsemide (DEMADEX) 100 MG tablet Take 100 mg by mouth 2 (two) times daily.      No current facility-administered medications on file prior to visit.   Allergies  Allergen Reactions  . Ace Inhibitors Cough   History   Social History  . Marital Status: Married    Spouse Name: N/A  . Number of Children: 4  . Years of Education: N/A   Occupational History  . Semi - Retired Administrator    Social History Main Topics  . Smoking status:  Current Every Day Smoker -- 0.25 packs/day for 45 years    Types: Cigarettes    Start date: 05/31/1958  . Smokeless tobacco: Current User     Comment: 1-2 cigarettes OF THE NEW ELECTRIC CIGARETTES  . Alcohol Use: 2.4 oz/week    2 Glasses of wine, 2 Shots of liquor per week     Comment: occ  . Drug Use: No  . Sexual Activity: Not on file   Other Topics Concern  . Not on file   Social History Narrative      Review of Systems  All other systems reviewed and are negative.      Objective:   Physical Exam  Constitutional: He appears well-developed and well-nourished. No distress.  Neck: Neck supple. No JVD present.  Cardiovascular: Normal rate, regular rhythm and normal heart sounds.   No murmur heard. Pulmonary/Chest: Effort normal and breath sounds normal. No respiratory distress. He has no wheezes. He has no rales.  Musculoskeletal: He exhibits no edema.  Lymphadenopathy:    He has no cervical adenopathy.  Skin: He is not diaphoretic.  Vitals reviewed.         Assessment & Plan:  IDDM (insulin dependent diabetes mellitus) - Plan: COMPLETE METABOLIC PANEL WITH GFR, CBC with Differential/Platelet, Lipid panel, Hemoglobin A1c  Hyperlipidemia  Essential hypertension  [name] blood pressures well controlled. I  will check his cholesterol. Goal LDL cholesterol is less than 100. I will also check a hemoglobin A1c. Goal hemoglobin A1c is less than 7. I spent over 20 minutes explaining his congestive heart failure and the risk of sudden cardiac death. I strongly encouraged the patient to continue to wear the LifeVest and to discuss with his cardiologist the possibility of an ICD. Patient states that he is scheduled to see his cardiologist in May to discuss this further I did my best to try to explain to the patient his risk, however he seems very agitated and upset and I'm unsure how much of this he is truly comprehending and accepting.

## 2014-05-09 ENCOUNTER — Encounter: Payer: Self-pay | Admitting: Family Medicine

## 2014-05-09 LAB — COMPLETE METABOLIC PANEL WITHOUT GFR
ALT: <8 U/L (ref 0–53)
AST: 10 U/L (ref 0–37)
Albumin: 4 g/dL (ref 3.5–5.2)
Alkaline Phosphatase: 65 U/L (ref 39–117)
BUN: 107 mg/dL — ABNORMAL HIGH (ref 6–23)
CO2: 24 meq/L (ref 19–32)
Calcium: 9.3 mg/dL (ref 8.4–10.5)
Chloride: 97 meq/L (ref 96–112)
Creat: 4.87 mg/dL — ABNORMAL HIGH (ref 0.50–1.35)
GFR, Est African American: 13 mL/min — ABNORMAL LOW
GFR, Est Non African American: 11 mL/min — ABNORMAL LOW
Glucose, Bld: 76 mg/dL (ref 70–99)
Potassium: 3.3 meq/L — ABNORMAL LOW (ref 3.5–5.3)
Sodium: 139 meq/L (ref 135–145)
Total Bilirubin: 0.5 mg/dL (ref 0.2–1.2)
Total Protein: 6.3 g/dL (ref 6.0–8.3)

## 2014-05-09 LAB — LIPID PANEL
Cholesterol: 196 mg/dL (ref 0–200)
HDL: 83 mg/dL (ref >=40)
LDL CALC: 100 mg/dL — AB (ref 0–99)
TRIGLYCERIDES: 64 mg/dL (ref <150)
Total CHOL/HDL Ratio: 2.4 Ratio
VLDL: 13 mg/dL (ref 0–40)

## 2014-05-12 DIAGNOSIS — R6889 Other general symptoms and signs: Secondary | ICD-10-CM | POA: Diagnosis not present

## 2014-05-16 ENCOUNTER — Ambulatory Visit (HOSPITAL_COMMUNITY)
Admission: RE | Admit: 2014-05-16 | Discharge: 2014-05-16 | Disposition: A | Discharge status: Home / Self Care | Payer: Medicare Other | Source: Ambulatory Visit | Attending: Family Medicine | Admitting: Family Medicine

## 2014-05-16 ENCOUNTER — Ambulatory Visit (INDEPENDENT_AMBULATORY_CARE_PROVIDER_SITE_OTHER): Payer: Medicare Other | Admitting: Family Medicine

## 2014-05-16 ENCOUNTER — Encounter: Payer: Self-pay | Admitting: Family Medicine

## 2014-05-16 VITALS — BP 150/60 | HR 62 | Temp 98.0°F | Resp 18 | Ht 71.0 in | Wt 192.0 lb

## 2014-05-16 DIAGNOSIS — R0989 Other specified symptoms and signs involving the circulatory and respiratory systems: Secondary | ICD-10-CM | POA: Insufficient documentation

## 2014-05-16 DIAGNOSIS — I5020 Unspecified systolic (congestive) heart failure: Secondary | ICD-10-CM | POA: Diagnosis not present

## 2014-05-16 DIAGNOSIS — R05 Cough: Secondary | ICD-10-CM

## 2014-05-16 DIAGNOSIS — N184 Chronic kidney disease, stage 4 (severe): Secondary | ICD-10-CM

## 2014-05-16 DIAGNOSIS — R059 Cough, unspecified: Secondary | ICD-10-CM

## 2014-05-16 MED ORDER — CEFUROXIME AXETIL 500 MG PO TABS: 500.0000 mg | ORAL_TABLET | Freq: Two times a day (BID) | ORAL | Status: DC

## 2014-05-16 MED ORDER — AZITHROMYCIN 250 MG PO TABS: ORAL_TABLET | ORAL | Status: DC

## 2014-05-16 NOTE — Progress Notes (Signed)
Subjective:    Patient ID: [name], male    DOB: 1945-06-06, 69 y.o.   MRN: [id number]  HPI  Patient has severe nonischemic cardiomyopathy with an ejection fraction of 20-25%. For the last 4 days he reports increasing dyspnea on exertion, worsening cough, body aches. He denies any fever. He reports he feels like he has the flu. His wife had a similar illness at approximately the same time but his seems to be getting worse. His weight is stable. He has not gained any fluid weight. There is no pitting edema in his legs. However on examination today he has left lower lobe crackles posteriorly which are pronounced and were not there previously last week when I saw him. There may also be faint right lower lobe crackles Past Medical History  Diagnosis Date  . Chronic combined systolic and diastolic CHF (congestive heart failure)     a. 02/2014 Echo: EF 20-25% (new), Gr 2 DD, mod conc LVH, mild AI/AS, mod MR, sev dil LA, mild TR.  Marland Kitchen Nonischemic cardiomyopathy     a. 02/2014 Echo: EF 20-25%;  b. 03/2014 Cath: LM nl, LAD min irregs, LCX   . 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 Hypertension   . Chronic kidney disease (CKD), stage IV (severe) 12/2009    a. baseline creat now ~ 4.  . Erectile dysfunction   . Colonic polyp   . Low serum testosterone level   . Tobacco abuse   . Sleep apnea 2010    Initially unable to afford CPAP  . Benign prostatic hypertrophy   . Obesity, Class I, BMI 30-34.9   . Hyperlipidemia   . Congestive heart failure (CHF)     EF 25-30% 07/2011  . Adenomatous colon polyp 2006  . H. pylori infection 2013    treated with prevpac  . Mild Ao Stenosis and Insufficiency    Past Surgical History  Procedure Laterality Date  . Retinal laser procedure      diabetic retinopathy  . Lymph node biopsy      surgical exploration of neck-not entirely clear that this represented a lymph node biopsy  . Colonoscopy  08/23/2004    Dr. Vivi Ferns rectum,  diminutive polyp of the rectosigmoid removed, inflamed focally adenomatous polyp  . Left and right heart catheterization with coronary angiogram N/A 03/19/2014    Procedure: LEFT AND RIGHT HEART CATHETERIZATION WITH CORONARY ANGIOGRAM;  Surgeon: Blane Ohara, MD;  Location: [facility] CATH LAB;  Service: Cardiovascular;  Laterality: N/A;   Current Outpatient Prescriptions on File Prior to Visit  Medication Sig Dispense Refill  . amLODipine (NORVASC) 10 MG tablet Take 1 tablet (10 mg total) by mouth daily. 30 tablet 6  . aspirin EC 81 MG tablet Take 81 mg by mouth daily.    . carvedilol (COREG) 12.5 MG tablet Take 1 tablet (12.5 mg total) by mouth 2 (two) times daily with a meal. 180 tablet 3  . cloNIDine (CATAPRES) 0.3 MG tablet Take 0.3 mg by mouth 2 (two) times daily.     . diphenhydramine-acetaminophen (TYLENOL PM) 25-500 MG TABS Take 1 tablet by mouth at bedtime as needed (sleep/ pain).    . febuxostat (ULORIC) 40 MG tablet Take 1 tablet (40 mg total) by mouth daily. 30 tablet 2  . hydrALAZINE (APRESOLINE) 50 MG tablet Take 1 tablet (50 mg total) by mouth 2 (two) times daily. 180 tablet 3  . Insulin Glargine (LANTUS SOLOSTAR) 100  UNIT/ML Solostar Pen Inject 30 Units into the skin daily at 10 pm. 5 pen 3  . metolazone (ZAROXOLYN) 2.5 MG tablet 2.5 mg Daily x 3 days and then 2.5 mg every Monday, Wednesday, and Friday. 30 tablet 3  . ONE TOUCH ULTRA TEST test strip Pt checks BS 6 x day - e11.9 200 each 5  . potassium chloride SA (K-DUR,KLOR-CON) 20 MEQ tablet Take 1 tablet (20 mEq total) by mouth 3 (three) times daily. 90 tablet 3  . torsemide (DEMADEX) 100 MG tablet Take 100 mg by mouth 2 (two) times daily.      No current facility-administered medications on file prior to visit.   Allergies  Allergen Reactions  . Ace Inhibitors Cough   History   Social History  . Marital Status: Married    Spouse Name: N/A  . Number of Children: 4  . Years of Education: N/A   Occupational History  .  Semi - Retired Administrator    Social History Main Topics  . Smoking status: Current Every Day Smoker -- 0.25 packs/day for 45 years    Types: Cigarettes    Start date: 05/31/1958  . Smokeless tobacco: Current User     Comment: 1-2 cigarettes OF THE NEW ELECTRIC CIGARETTES  . Alcohol Use: 2.4 oz/week    2 Glasses of wine, 2 Shots of liquor per week     Comment: occ  . Drug Use: No  . Sexual Activity: Not on file   Other Topics Concern  . Not on file   Social History Narrative     Review of Systems  All other systems reviewed and are negative.      Objective:   Physical Exam  Neck: No JVD present.  Cardiovascular: Normal rate, regular rhythm and normal heart sounds.   Pulmonary/Chest: Effort normal. He has rales.  Abdominal: Soft. Bowel sounds are normal.  Musculoskeletal: He exhibits no edema.  Vitals reviewed.         Assessment & Plan:  Cough - Plan: DG Chest 2 View, azithromycin (ZITHROMAX) 250 MG tablet, cefUROXime (CEFTIN) 500 MG tablet  I'm concerned the patient may be developing community-acquired pneumonia. There is no edema in his legs. There is no JVD. His weight is stable. His oxygen saturation is 98% on room air. I will send the patient immediately for a chest x-ray. I will also treat the patient presumptively for community-acquired pneumonia. Given his severe kidney disease, I cannot use a fluoroquinolone. Instead I will use a Z-Pak along with Ceftin 500 mg by mouth twice a day for 10 days. Recheck here on Monday. If the chest x-ray shows pulmonary edema I will also increase his torsemide temporarily.

## 2014-05-18 DIAGNOSIS — R40241 Glasgow coma scale score 13-15: Secondary | ICD-10-CM | POA: Diagnosis not present

## 2014-05-20 ENCOUNTER — Encounter (HOSPITAL_COMMUNITY): Payer: Self-pay

## 2014-05-20 ENCOUNTER — Ambulatory Visit: Payer: Medicare Other | Admitting: Family Medicine

## 2014-05-20 ENCOUNTER — Emergency Department (HOSPITAL_COMMUNITY): Payer: Medicare Other

## 2014-05-20 ENCOUNTER — Observation Stay (HOSPITAL_COMMUNITY)
Admission: EM | Admit: 2014-05-20 | Discharge: 2014-05-22 | Disposition: A | Discharge status: Home / Self Care | Payer: Medicare Other | Attending: Internal Medicine | Admitting: Internal Medicine

## 2014-05-20 DIAGNOSIS — R7989 Other specified abnormal findings of blood chemistry: Secondary | ICD-10-CM | POA: Diagnosis not present

## 2014-05-20 DIAGNOSIS — Z8619 Personal history of other infectious and parasitic diseases: Secondary | ICD-10-CM | POA: Diagnosis not present

## 2014-05-20 DIAGNOSIS — N184 Chronic kidney disease, stage 4 (severe): Secondary | ICD-10-CM | POA: Diagnosis not present

## 2014-05-20 DIAGNOSIS — R748 Abnormal levels of other serum enzymes: Secondary | ICD-10-CM | POA: Diagnosis not present

## 2014-05-20 DIAGNOSIS — I350 Nonrheumatic aortic (valve) stenosis: Secondary | ICD-10-CM | POA: Diagnosis not present

## 2014-05-20 DIAGNOSIS — Z720 Tobacco use: Secondary | ICD-10-CM | POA: Insufficient documentation

## 2014-05-20 DIAGNOSIS — Z794 Long term (current) use of insulin: Secondary | ICD-10-CM | POA: Diagnosis not present

## 2014-05-20 DIAGNOSIS — R05 Cough: Secondary | ICD-10-CM | POA: Diagnosis not present

## 2014-05-20 DIAGNOSIS — Z86018 Personal history of other benign neoplasm: Secondary | ICD-10-CM | POA: Diagnosis not present

## 2014-05-20 DIAGNOSIS — N529 Male erectile dysfunction, unspecified: Secondary | ICD-10-CM | POA: Insufficient documentation

## 2014-05-20 DIAGNOSIS — I10 Essential (primary) hypertension: Secondary | ICD-10-CM

## 2014-05-20 DIAGNOSIS — Z9981 Dependence on supplemental oxygen: Secondary | ICD-10-CM | POA: Diagnosis not present

## 2014-05-20 DIAGNOSIS — R531 Weakness: Secondary | ICD-10-CM | POA: Diagnosis not present

## 2014-05-20 DIAGNOSIS — I509 Heart failure, unspecified: Secondary | ICD-10-CM | POA: Diagnosis not present

## 2014-05-20 DIAGNOSIS — Z79899 Other long term (current) drug therapy: Secondary | ICD-10-CM | POA: Diagnosis not present

## 2014-05-20 DIAGNOSIS — E11319 Type 2 diabetes mellitus with unspecified diabetic retinopathy without macular edema: Secondary | ICD-10-CM | POA: Diagnosis not present

## 2014-05-20 DIAGNOSIS — R109 Unspecified abdominal pain: Secondary | ICD-10-CM | POA: Diagnosis present

## 2014-05-20 DIAGNOSIS — I428 Other cardiomyopathies: Secondary | ICD-10-CM

## 2014-05-20 DIAGNOSIS — I129 Hypertensive chronic kidney disease with stage 1 through stage 4 chronic kidney disease, or unspecified chronic kidney disease: Secondary | ICD-10-CM | POA: Diagnosis not present

## 2014-05-20 DIAGNOSIS — Z7982 Long term (current) use of aspirin: Secondary | ICD-10-CM | POA: Insufficient documentation

## 2014-05-20 DIAGNOSIS — E669 Obesity, unspecified: Secondary | ICD-10-CM | POA: Diagnosis not present

## 2014-05-20 DIAGNOSIS — I5022 Chronic systolic (congestive) heart failure: Secondary | ICD-10-CM | POA: Diagnosis present

## 2014-05-20 DIAGNOSIS — I429 Cardiomyopathy, unspecified: Secondary | ICD-10-CM | POA: Insufficient documentation

## 2014-05-20 DIAGNOSIS — N170 Acute kidney failure with tubular necrosis: Secondary | ICD-10-CM

## 2014-05-20 DIAGNOSIS — E291 Testicular hypofunction: Secondary | ICD-10-CM | POA: Insufficient documentation

## 2014-05-20 DIAGNOSIS — E119 Type 2 diabetes mellitus without complications: Secondary | ICD-10-CM | POA: Diagnosis not present

## 2014-05-20 DIAGNOSIS — G4730 Sleep apnea, unspecified: Secondary | ICD-10-CM | POA: Insufficient documentation

## 2014-05-20 DIAGNOSIS — I5042 Chronic combined systolic (congestive) and diastolic (congestive) heart failure: Secondary | ICD-10-CM | POA: Diagnosis not present

## 2014-05-20 DIAGNOSIS — R404 Transient alteration of awareness: Secondary | ICD-10-CM | POA: Diagnosis not present

## 2014-05-20 DIAGNOSIS — K635 Polyp of colon: Secondary | ICD-10-CM | POA: Insufficient documentation

## 2014-05-20 DIAGNOSIS — I420 Dilated cardiomyopathy: Secondary | ICD-10-CM | POA: Diagnosis not present

## 2014-05-20 DIAGNOSIS — I252 Old myocardial infarction: Secondary | ICD-10-CM | POA: Diagnosis not present

## 2014-05-20 DIAGNOSIS — R778 Other specified abnormalities of plasma proteins: Secondary | ICD-10-CM

## 2014-05-20 LAB — CBC WITH DIFFERENTIAL/PLATELET
Basophils Absolute: 0 10*3/uL (ref 0.0–0.1)
Basophils Relative: 0 % (ref 0–1)
Eosinophils Absolute: 0.1 10*3/uL (ref 0.0–0.7)
Eosinophils Relative: 1 % (ref 0–5)
HCT: 42.9 % (ref 39.0–52.0)
HEMOGLOBIN: 14.4 g/dL (ref 13.0–17.0)
Lymphocytes Relative: 11 % — ABNORMAL LOW (ref 12–46)
Lymphs Abs: 0.6 10*3/uL — ABNORMAL LOW (ref 0.7–4.0)
MCH: 29.9 pg (ref 26.0–34.0)
MCHC: 33.6 g/dL (ref 30.0–36.0)
MCV: 89 fL (ref 78.0–100.0)
MONO ABS: 0.5 10*3/uL (ref 0.1–1.0)
MONOS PCT: 9 % (ref 3–12)
NEUTROS ABS: 4.3 10*3/uL (ref 1.7–7.7)
Neutrophils Relative %: 79 % — ABNORMAL HIGH (ref 43–77)
Platelets: 166 10*3/uL (ref 150–400)
RBC: 4.82 MIL/uL (ref 4.22–5.81)
RDW: 14.6 % (ref 11.5–15.5)
WBC: 5.4 10*3/uL (ref 4.0–10.5)

## 2014-05-20 LAB — COMPREHENSIVE METABOLIC PANEL
ALT: 12 U/L — ABNORMAL LOW (ref 17–63)
ANION GAP: 15 (ref 5–15)
AST: 18 U/L (ref 15–41)
Albumin: 3.5 g/dL (ref 3.5–5.0)
Alkaline Phosphatase: 65 U/L (ref 38–126)
BUN: 120 mg/dL — AB (ref 6–20)
CALCIUM: 9 mg/dL (ref 8.9–10.3)
CO2: 28 mmol/L (ref 22–32)
Chloride: 98 mmol/L — ABNORMAL LOW (ref 101–111)
Creatinine, Ser: 4.88 mg/dL — ABNORMAL HIGH (ref 0.61–1.24)
GFR calc non Af Amer: 11 mL/min — ABNORMAL LOW (ref >60)
GFR, EST AFRICAN AMERICAN: 13 mL/min — AB (ref >60)
GLUCOSE: 141 mg/dL — AB (ref 70–99)
Potassium: 3.1 mmol/L — ABNORMAL LOW (ref 3.5–5.1)
Sodium: 141 mmol/L (ref 135–145)
TOTAL PROTEIN: 6.6 g/dL (ref 6.5–8.1)
Total Bilirubin: 0.9 mg/dL (ref 0.3–1.2)

## 2014-05-20 LAB — URINALYSIS, ROUTINE W REFLEX MICROSCOPIC
Bilirubin Urine: NEGATIVE
GLUCOSE, UA: NEGATIVE mg/dL
KETONES UR: NEGATIVE mg/dL
Leukocytes, UA: NEGATIVE
NITRITE: NEGATIVE
PH: 7 (ref 5.0–8.0)
Protein, ur: 100 mg/dL — AB
Specific Gravity, Urine: 1.02 (ref 1.005–1.030)
UROBILINOGEN UA: 0.2 mg/dL (ref 0.0–1.0)

## 2014-05-20 LAB — GLUCOSE, CAPILLARY
GLUCOSE-CAPILLARY: 138 mg/dL — AB (ref 70–99)
GLUCOSE-CAPILLARY: 186 mg/dL — AB (ref 70–99)

## 2014-05-20 LAB — URINE MICROSCOPIC-ADD ON

## 2014-05-20 LAB — TROPONIN I
Troponin I: 0.1 ng/mL — ABNORMAL HIGH (ref <0.031)
Troponin I: 0.11 ng/mL — ABNORMAL HIGH (ref <0.031)
Troponin I: 0.12 ng/mL — ABNORMAL HIGH (ref <0.031)

## 2014-05-20 LAB — LIPASE, BLOOD: Lipase: 33 U/L (ref 22–51)

## 2014-05-20 MED ORDER — ONDANSETRON HCL 4 MG/2ML IJ SOLN
4.0000 mg | Freq: Once | INTRAMUSCULAR | Status: AC
  Administered 2014-05-20: 4 mg via INTRAVENOUS
  Filled 2014-05-20: qty 2

## 2014-05-20 MED ORDER — CLONIDINE HCL 0.2 MG PO TABS
0.3000 mg | ORAL_TABLET | Freq: Two times a day (BID) | ORAL | Status: DC
  Administered 2014-05-20 – 2014-05-22 (×4): 0.3 mg via ORAL
  Filled 2014-05-20 (×8): qty 1

## 2014-05-20 MED ORDER — SODIUM CHLORIDE 0.9 % IJ SOLN
3.0000 mL | Freq: Two times a day (BID) | INTRAMUSCULAR | Status: DC
  Administered 2014-05-20 – 2014-05-22 (×3): 3 mL via INTRAVENOUS

## 2014-05-20 MED ORDER — HEPARIN SODIUM (PORCINE) 5000 UNIT/ML IJ SOLN
5000.0000 [IU] | Freq: Three times a day (TID) | INTRAMUSCULAR | Status: DC
  Administered 2014-05-20 – 2014-05-22 (×5): 5000 [IU] via SUBCUTANEOUS
  Filled 2014-05-20 (×4): qty 1

## 2014-05-20 MED ORDER — DIPHENHYDRAMINE-APAP (SLEEP) 25-500 MG PO TABS: 1.0000 | ORAL_TABLET | Freq: Every evening | ORAL | Status: DC | PRN

## 2014-05-20 MED ORDER — ONDANSETRON HCL 4 MG PO TABS: 4.0000 mg | ORAL_TABLET | Freq: Four times a day (QID) | ORAL | Status: DC | PRN

## 2014-05-20 MED ORDER — INSULIN GLARGINE 100 UNIT/ML ~~LOC~~ SOLN
30.0000 [IU] | Freq: Every day | SUBCUTANEOUS | Status: DC
  Administered 2014-05-20 – 2014-05-21 (×2): 30 [IU] via SUBCUTANEOUS
  Filled 2014-05-20 (×3): qty 0.3

## 2014-05-20 MED ORDER — SODIUM CHLORIDE 0.9 % IV BOLUS (SEPSIS)
1000.0000 mL | Freq: Once | INTRAVENOUS | Status: AC
  Administered 2014-05-20: 1000 mL via INTRAVENOUS

## 2014-05-20 MED ORDER — POTASSIUM CHLORIDE CRYS ER 20 MEQ PO TBCR
20.0000 meq | EXTENDED_RELEASE_TABLET | Freq: Three times a day (TID) | ORAL | Status: DC
Start: 2014-05-20 — End: 2014-05-22
  Administered 2014-05-20 – 2014-05-22 (×6): 20 meq via ORAL
  Filled 2014-05-20 (×6): qty 1

## 2014-05-20 MED ORDER — POTASSIUM CHLORIDE CRYS ER 20 MEQ PO TBCR
40.0000 meq | EXTENDED_RELEASE_TABLET | Freq: Once | ORAL | Status: AC
  Administered 2014-05-20: 40 meq via ORAL
  Filled 2014-05-20: qty 2

## 2014-05-20 MED ORDER — INSULIN ASPART 100 UNIT/ML ~~LOC~~ SOLN: 0.0000 [IU] | Freq: Every day | SUBCUTANEOUS | Status: DC

## 2014-05-20 MED ORDER — INSULIN ASPART 100 UNIT/ML ~~LOC~~ SOLN
0.0000 [IU] | Freq: Three times a day (TID) | SUBCUTANEOUS | Status: DC
  Administered 2014-05-21 (×3): 1 [IU] via SUBCUTANEOUS

## 2014-05-20 MED ORDER — TORSEMIDE 20 MG PO TABS
50.0000 mg | ORAL_TABLET | Freq: Two times a day (BID) | ORAL | Status: DC
Start: 2014-05-20 — End: 2014-05-22
  Administered 2014-05-20 – 2014-05-22 (×4): 50 mg via ORAL
  Filled 2014-05-20 (×4): qty 3

## 2014-05-20 MED ORDER — CARVEDILOL 12.5 MG PO TABS
12.5000 mg | ORAL_TABLET | Freq: Two times a day (BID) | ORAL | Status: DC
  Administered 2014-05-20 – 2014-05-22 (×4): 12.5 mg via ORAL
  Filled 2014-05-20 (×4): qty 1

## 2014-05-20 MED ORDER — HYDRALAZINE HCL 25 MG PO TABS
50.0000 mg | ORAL_TABLET | Freq: Two times a day (BID) | ORAL | Status: DC
  Administered 2014-05-20 – 2014-05-22 (×4): 50 mg via ORAL
  Filled 2014-05-20 (×4): qty 2

## 2014-05-20 MED ORDER — FEBUXOSTAT 40 MG PO TABS
40.0000 mg | ORAL_TABLET | Freq: Every day | ORAL | Status: DC
  Filled 2014-05-20 (×3): qty 1

## 2014-05-20 MED ORDER — ONDANSETRON HCL 4 MG/2ML IJ SOLN: 4.0000 mg | Freq: Four times a day (QID) | INTRAMUSCULAR | Status: DC | PRN

## 2014-05-20 MED ORDER — TORSEMIDE 100 MG PO TABS: 100.0000 mg | ORAL_TABLET | Freq: Two times a day (BID) | ORAL | Status: DC

## 2014-05-20 MED ORDER — ASPIRIN EC 81 MG PO TBEC
81.0000 mg | DELAYED_RELEASE_TABLET | Freq: Every day | ORAL | Status: DC
  Administered 2014-05-20 – 2014-05-22 (×3): 81 mg via ORAL
  Filled 2014-05-20 (×3): qty 1

## 2014-05-20 MED ORDER — SODIUM CHLORIDE 0.9 % IV SOLN
INTRAVENOUS | Status: AC
  Administered 2014-05-20: 18:00:00 via INTRAVENOUS

## 2014-05-20 MED ORDER — AMLODIPINE BESYLATE 5 MG PO TABS
10.0000 mg | ORAL_TABLET | Freq: Every day | ORAL | Status: DC
  Administered 2014-05-20 – 2014-05-22 (×3): 10 mg via ORAL
  Filled 2014-05-20 (×3): qty 2

## 2014-05-20 NOTE — ED Notes (Signed)
Pt complain of abdominal pain , n/v and generalized weakness. States he was evaluated by his PCP and was given antibotics

## 2014-05-20 NOTE — ED Notes (Signed)
Patient given Ginger Ale per MD approval.

## 2014-05-20 NOTE — ED Provider Notes (Signed)
CSN: [id number]     Arrival date & time 05/20/14  1043 History   This chart was scribed for Nat Christen, MD by Eustaquio Maize, ED Scribe. This patient was seen in room APAH2/AP[facility]2 and the patient's care was started at 11:26 AM.    Chief Complaint  Patient presents with  . Abdominal Pain   No language interpreter was used.     HPI Comments: [name] is a 69 y.o. male with hx DM, HTN, HTN, HLD, CHF, and CKD stage IV who presents to the Emergency Department complaining of generalized weakness that began 1 week ago. Pt also complains of abdominal pain, nausea, and vomiting. Wife states that pt was weak and sweaty this morning. Wife mentions that pt is dehydrated and hasn't been eating well. Pt able to ambulate without difficulty. Denies chest pain, difficulty urinating, or any other symptoms. Pt evaluated by PCP 1 week ago and put on Penicillin. Wife states that he was sent here to have CXR done to rule out pneumonia.   PCP - Dennard Schaumann   Past Medical History  Diagnosis Date  . Chronic combined systolic and diastolic CHF (congestive heart failure)     a. 02/2014 Echo: EF 20-25% (new), Gr 2 DD, mod conc LVH, mild AI/AS, mod MR, sev dil LA, mild TR.  Marland Kitchen Nonischemic cardiomyopathy     a. 02/2014 Echo: EF 20-25%;  b. 03/2014 Cath: LM nl, LAD min irregs, LCX   . 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 Hypertension   . Chronic kidney disease (CKD), stage IV (severe) 12/2009    a. baseline creat now ~ 4.  . Erectile dysfunction   . Colonic polyp   . Low serum testosterone level   . Tobacco abuse   . Sleep apnea 2010    Initially unable to afford CPAP  . Benign prostatic hypertrophy   . Obesity, Class I, BMI 30-34.9   . Hyperlipidemia   . Congestive heart failure (CHF)     EF 25-30% 07/2011  . Adenomatous colon polyp 2006  . H. pylori infection 2013    treated with prevpac  . Mild Ao Stenosis and Insufficiency    Past Surgical History  Procedure Laterality Date  .  Retinal laser procedure      diabetic retinopathy  . Lymph node biopsy      surgical exploration of neck-not entirely clear that this represented a lymph node biopsy  . Colonoscopy  08/23/2004    Dr. Vivi Ferns rectum, diminutive polyp of the rectosigmoid removed, inflamed focally adenomatous polyp  . Left and right heart catheterization with coronary angiogram N/A 03/19/2014    Procedure: LEFT AND RIGHT HEART CATHETERIZATION WITH CORONARY ANGIOGRAM;  Surgeon: Blane Ohara, MD;  Location: [facility] CATH LAB;  Service: Cardiovascular;  Laterality: N/A;   Family History  Problem Relation Age of Onset  . Hypertension Mother   . Cancer Mother   . Cancer Father   . Diabetes Sister    History  Substance Use Topics  . Smoking status: Current Every Day Smoker -- 0.25 packs/day for 45 years    Types: Cigarettes    Start date: 05/31/1958  . Smokeless tobacco: Current User     Comment: 1-2 cigarettes OF THE NEW ELECTRIC CIGARETTES  . Alcohol Use: 2.4 oz/week    2 Glasses of wine, 2 Shots of liquor per week     Comment: occ    Review of Systems  A complete  10 system review of systems was obtained and all systems are negative except as noted in the HPI and PMH.    Allergies  Ace inhibitors  Home Medications   Prior to Admission medications   Medication Sig Start Date End Date Taking? Authorizing Provider  amLODipine (NORVASC) 10 MG tablet Take 1 tablet (10 mg total) by mouth daily. 03/20/14  Yes Rogelia Mire, NP  aspirin EC 81 MG tablet Take 81 mg by mouth daily.   Yes Historical Provider, MD  carvedilol (COREG) 12.5 MG tablet Take 1 tablet (12.5 mg total) by mouth 2 (two) times daily with a meal. 03/24/14  Yes Lendon Colonel, NP  cefUROXime (CEFTIN) 500 MG tablet Take 1 tablet (500 mg total) by mouth 2 (two) times daily with a meal. 05/16/14  Yes Susy Frizzle, MD  cloNIDine (CATAPRES) 0.3 MG tablet Take 0.3 mg by mouth 2 (two) times daily.  11/28/13  Yes Historical Provider, MD   diphenhydramine-acetaminophen (TYLENOL PM) 25-500 MG TABS Take 1 tablet by mouth at bedtime as needed (sleep/ pain).   Yes Historical Provider, MD  febuxostat (ULORIC) 40 MG tablet Take 1 tablet (40 mg total) by mouth daily. 04/11/13  Yes Alycia Rossetti, MD  hydrALAZINE (APRESOLINE) 50 MG tablet Take 1 tablet (50 mg total) by mouth 2 (two) times daily. 03/13/14  Yes Herminio Commons, MD  Insulin Glargine (LANTUS SOLOSTAR) 100 UNIT/ML Solostar Pen Inject 30 Units into the skin daily at 10 pm. 03/28/14  Yes Susy Frizzle, MD  metolazone (ZAROXOLYN) 2.5 MG tablet 2.5 mg Daily x 3 days and then 2.5 mg every Monday, Wednesday, and Friday. 03/20/14  Yes Rogelia Mire, NP  potassium chloride SA (K-DUR,KLOR-CON) 20 MEQ tablet Take 1 tablet (20 mEq total) by mouth 3 (three) times daily. 03/20/14  Yes Rogelia Mire, NP  torsemide (DEMADEX) 100 MG tablet Take 100 mg by mouth 2 (two) times daily.    Yes Historical Provider, MD  azithromycin (ZITHROMAX) 250 MG tablet 2 tabs poqday1, 1 tab poqday 2-5 Patient not taking: Reported on 05/20/2014 05/16/14   Susy Frizzle, MD  ONE TOUCH ULTRA TEST test strip Pt checks BS 6 x day - e11.9 Patient not taking: Reported on 05/20/2014 04/15/14   Susy Frizzle, MD   Triage Vitals: BP 213/38 mmHg  Pulse 64  Temp(Src) 98 F (36.7 C) (Oral)  Resp 13  SpO2 97%   Physical Exam  Constitutional: He is oriented to person, place, and time. He appears well-developed and well-nourished.  HENT:  Head: Normocephalic and atraumatic.  Eyes: Conjunctivae and EOM are normal. Pupils are equal, round, and reactive to light.  Neck: Normal range of motion. Neck supple.  Cardiovascular: Normal rate and regular rhythm.   Pulmonary/Chest: Effort normal and breath sounds normal.  Abdominal: Soft. Bowel sounds are normal.  Musculoskeletal: Normal range of motion.  Neurological: He is alert and oriented to person, place, and time.  Skin: Skin is warm and dry.  Psychiatric:  He has a normal mood and affect. His behavior is normal.  Nursing note and vitals reviewed.   ED Course  Procedures (including critical care time)  DIAGNOSTIC STUDIES: Oxygen Saturation is 97% on RA, normal by my interpretation.    COORDINATION OF CARE: 11:30 AM-Discussed treatment plan which includes IV Fluids, CBC, BMP with pt at bedside and pt agreed to plan.   Labs Review Labs Reviewed  CBC WITH DIFFERENTIAL/PLATELET - Abnormal; Notable for the following:  Neutrophils Relative % 79 (*)    Lymphocytes Relative 11 (*)    Lymphs Abs 0.6 (*)    All other components within normal limits  COMPREHENSIVE METABOLIC PANEL - Abnormal; Notable for the following:    Potassium 3.1 (*)    Chloride 98 (*)    Glucose, Bld 141 (*)    BUN 120 (*)    Creatinine, Ser 4.88 (*)    ALT 12 (*)    GFR calc non Af Amer 11 (*)    GFR calc Af Amer 13 (*)    All other components within normal limits  URINALYSIS, ROUTINE W REFLEX MICROSCOPIC - Abnormal; Notable for the following:    Hgb urine dipstick SMALL (*)    Protein, ur 100 (*)    All other components within normal limits  TROPONIN I - Abnormal; Notable for the following:    Troponin I 0.10 (*)    All other components within normal limits  LIPASE, BLOOD  URINE MICROSCOPIC-ADD ON  TROPONIN I    Imaging Review Dg Chest 2 View  05/20/2014   CLINICAL DATA:  Cough and weakness for 1 week  EXAM: CHEST  2 VIEW  COMPARISON:  05/16/2014  FINDINGS: Cardiac shadow is within normal limits. The lungs are well aerated bilaterally. No focal infiltrate is seen. No effusion is noted. Degenerative changes of the thoracic spine are again seen.  IMPRESSION: No acute abnormality noted.   Electronically Signed   By: Inez Catalina M.D.   On: 05/20/2014 14:02     EKG Interpretation   Date/Time:  Tuesday May 20 2014 12:15:33 EDT Ventricular Rate:  61 PR Interval:  168 QRS Duration: 118 QT Interval:  490 QTC Calculation: 493 R Axis:   28 Text  Interpretation:  Normal sinus rhythm Possible Left atrial enlargement  Left ventricular hypertrophy with QRS widening and repolarization  abnormality Prolonged QT Abnormal ECG Confirmed by Trustin Chapa  MD, Mckynzie Liwanag ([id number])  on 05/20/2014 12:32:22 PM      MDM   Final diagnoses:  Weakness  Chronic kidney disease, stage 4, severely decreased GFR  Elevated troponin   Patient generally does not feel well. He complains of nausea, weakness. Creatinine remains elevated at 4.88. EKG negative. Chest x-ray negative. First troponin 0.10. Potassium low at 3.1   We will admit for serial troponins. Oral potassium given.   I personally performed the services described in this documentation, which was scribed in my presence. The recorded information has been reviewed and is accurate.      Nat Christen, MD 05/20/14 [id number]

## 2014-05-20 NOTE — H&P (Signed)
[facility] History and Physical  KITO CUFFE PJA:[id number] DOB: 10-Jul-1945 DOA: 05/20/2014  Referring physician: ER, Dr. Lacinda Axon PCP: Odette Fraction, MD   Chief Complaint: Weakness  HPI: [name] is a 69 y.o. male  This is a 69 year old man, diabetic, hypertensive, history of nonischemic cardiomyopathy and stage IV chronic kidney disease, presents now with weakness. He has been feeling generally weak for the last 1 week. He has had nausea and some vomiting but not extensively so. This morning he was so weak that when he was standing in the bathroom, he slumped to the floor. He did not have loss of consciousness. The patient has had poor by mouth intake in the last few days. He denies any chest pain, dyspnea or fever. He has had a cough which is largely nonproductive. He was seen by his primary care physician about a week ago and was treated with antibiotics for a possible pneumonia. He was sent to this hospital on April 29 for chest x-ray which did not show any acute process. Evaluation in the emergency room shows slightly elevated troponin level. He continues to have weakness and now he is going to be admitted for further investigation and management.   Review of Systems:  Apart from symptoms above, all systems negative.  Past Medical History  Diagnosis Date  . Chronic combined systolic and diastolic CHF (congestive heart failure)     a. 02/2014 Echo: EF 20-25% (new), Gr 2 DD, mod conc LVH, mild AI/AS, mod MR, sev dil LA, mild TR.  Marland Kitchen Nonischemic cardiomyopathy     a. 02/2014 Echo: EF 20-25%;  b. 03/2014 Cath: LM nl, LAD min irregs, LCX   . 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 Hypertension   . Chronic kidney disease (CKD), stage IV (severe) 12/2009    a. baseline creat now ~ 4.  . Erectile dysfunction   . Colonic polyp   . Low serum testosterone level   . Tobacco abuse   . Sleep apnea 2010    Initially unable to afford CPAP  . Benign  prostatic hypertrophy   . Obesity, Class I, BMI 30-34.9   . Hyperlipidemia   . Congestive heart failure (CHF)     EF 25-30% 07/2011  . Adenomatous colon polyp 2006  . H. pylori infection 2013    treated with prevpac  . Mild Ao Stenosis and Insufficiency    Past Surgical History  Procedure Laterality Date  . Retinal laser procedure      diabetic retinopathy  . Lymph node biopsy      surgical exploration of neck-not entirely clear that this represented a lymph node biopsy  . Colonoscopy  08/23/2004    Dr. Vivi Ferns rectum, diminutive polyp of the rectosigmoid removed, inflamed focally adenomatous polyp  . Left and right heart catheterization with coronary angiogram N/A 03/19/2014    Procedure: LEFT AND RIGHT HEART CATHETERIZATION WITH CORONARY ANGIOGRAM;  Surgeon: Blane Ohara, MD;  Location: [facility] CATH LAB;  Service: Cardiovascular;  Laterality: N/A;   Social History:  reports that he has been smoking Cigarettes.  He started smoking about 56 years ago. He has a 11.25 pack-year smoking history. He uses smokeless tobacco. He reports that he drinks about 2.4 oz of alcohol per week. He reports that he does not use illicit drugs.  Allergies  Allergen Reactions  . Ace Inhibitors Cough    Family History  Problem Relation Age of Onset  . Hypertension  Mother   . Cancer Mother   . Cancer Father   . Diabetes Sister     Prior to Admission medications   Medication Sig Start Date End Date Taking? Authorizing Provider  amLODipine (NORVASC) 10 MG tablet Take 1 tablet (10 mg total) by mouth daily. 03/20/14  Yes Rogelia Mire, NP  aspirin EC 81 MG tablet Take 81 mg by mouth daily.   Yes Historical Provider, MD  carvedilol (COREG) 12.5 MG tablet Take 1 tablet (12.5 mg total) by mouth 2 (two) times daily with a meal. 03/24/14  Yes Lendon Colonel, NP  cefUROXime (CEFTIN) 500 MG tablet Take 1 tablet (500 mg total) by mouth 2 (two) times daily with a meal. 05/16/14  Yes Susy Frizzle, MD    cloNIDine (CATAPRES) 0.3 MG tablet Take 0.3 mg by mouth 2 (two) times daily.  11/28/13  Yes Historical Provider, MD  diphenhydramine-acetaminophen (TYLENOL PM) 25-500 MG TABS Take 1 tablet by mouth at bedtime as needed (sleep/ pain).   Yes Historical Provider, MD  febuxostat (ULORIC) 40 MG tablet Take 1 tablet (40 mg total) by mouth daily. 04/11/13  Yes Alycia Rossetti, MD  hydrALAZINE (APRESOLINE) 50 MG tablet Take 1 tablet (50 mg total) by mouth 2 (two) times daily. 03/13/14  Yes Herminio Commons, MD  Insulin Glargine (LANTUS SOLOSTAR) 100 UNIT/ML Solostar Pen Inject 30 Units into the skin daily at 10 pm. 03/28/14  Yes Susy Frizzle, MD  metolazone (ZAROXOLYN) 2.5 MG tablet 2.5 mg Daily x 3 days and then 2.5 mg every Monday, Wednesday, and Friday. 03/20/14  Yes Rogelia Mire, NP  potassium chloride SA (K-DUR,KLOR-CON) 20 MEQ tablet Take 1 tablet (20 mEq total) by mouth 3 (three) times daily. 03/20/14  Yes Rogelia Mire, NP  torsemide (DEMADEX) 100 MG tablet Take 100 mg by mouth 2 (two) times daily.    Yes Historical Provider, MD  azithromycin (ZITHROMAX) 250 MG tablet 2 tabs poqday1, 1 tab poqday 2-5 Patient not taking: Reported on 05/20/2014 05/16/14   Susy Frizzle, MD  ONE TOUCH ULTRA TEST test strip Pt checks BS 6 x day - e11.9 Patient not taking: Reported on 05/20/2014 04/15/14   Susy Frizzle, MD   Physical Exam: Filed Vitals:   05/20/14 1052 05/20/14 1254 05/20/14 1343  BP: 213/38 190/80 169/48  Pulse: 64 68 71  Temp: 98 F (36.7 C) 97.9 F (36.6 C) 97.7 F (36.5 C)  TempSrc: Oral Oral Oral  Resp: 13 18 18   SpO2: 97% 100% 100%    Wt Readings from Last 3 Encounters:  05/16/14 87.091 kg (192 lb)  05/08/14 87.998 kg (194 lb)  04/28/14 89.721 kg (197 lb 12.8 oz)    General:  Appears clinically dehydrated. Eyes: PERRL, normal lids, irises & conjunctiva ENT: grossly normal hearing, lips & tongue Neck: no LAD, masses or thyromegaly Cardiovascular: RRR, no m/r/g.  No LE edema. I do not see evidence of heart failure. Telemetry: SR, no arrhythmias  Respiratory: CTA bilaterally, no w/r/r. Normal respiratory effort. Abdomen: soft, ntnd Skin: no rash or induration seen on limited exam Musculoskeletal: grossly normal tone BUE/BLE Psychiatric: grossly normal mood and affect, speech fluent and appropriate Neurologic: grossly non-focal.          Labs on Admission:  Basic Metabolic Panel:  Recent Labs Lab 05/20/14 1158  NA 141  K 3.1*  CL 98*  CO2 28  GLUCOSE 141*  BUN 120*  CREATININE 4.88*  CALCIUM 9.0  Liver Function Tests:  Recent Labs Lab 05/20/14 1158  AST 18  ALT 12*  ALKPHOS 65  BILITOT 0.9  PROT 6.6  ALBUMIN 3.5    Recent Labs Lab 05/20/14 1158  LIPASE 33   No results for input(s): AMMONIA in the last 168 hours. CBC:  Recent Labs Lab 05/20/14 1158  WBC 5.4  NEUTROABS 4.3  HGB 14.4  HCT 42.9  MCV 89.0  PLT 166   Cardiac Enzymes:  Recent Labs Lab 05/20/14 1158  TROPONINI 0.10*    BNP (last 3 results)  Recent Labs  01/22/14 2302  BNP >4500.0*    ProBNP (last 3 results)  Recent Labs  11/02/13 2047 12/01/13 0823  PROBNP 62727.0* 37333.0*    CBG: No results for input(s): GLUCAP in the last 168 hours.  Radiological Exams on Admission: Dg Chest 2 View  05/20/2014   CLINICAL DATA:  Cough and weakness for 1 week  EXAM: CHEST  2 VIEW  COMPARISON:  05/16/2014  FINDINGS: Cardiac shadow is within normal limits. The lungs are well aerated bilaterally. No focal infiltrate is seen. No effusion is noted. Degenerative changes of the thoracic spine are again seen.  IMPRESSION: No acute abnormality noted.   Electronically Signed   By: Inez Catalina M.D.   On: 05/20/2014 14:02    EKG: Independently reviewed. Normal sinus rhythm with evidence of left ventricular hypertrophy. No acute ST-T wave changes.  Assessment/Plan   1. Weakness. I think this is probably multifactorial. I think he is mostly clinically  dehydrated. We will reduce his torsemide and give him gentle IV fluids, watching carefully his fluid status in view of his history of nonischemic cardio myopathy. Monitor renal function closely. 2. Nonischemic cardio myopathy. He is known to have an ejection fraction of 25-30%. Currently he is not in heart failure clinically. 3. Worsening renal function. He has chronic kidney disease stage IV. His baseline creatinine appears to be around for and now it is closer to 5. I think this probably reflects a degree of dehydration. I will request nephrology consultation. 4. Diabetes. Continue with home medications and sliding scale of insulin. 5. Hypokalemia. Potassium supplementation.  Further recommendations will depend on patient's hospital progress.   Code Status: Full code.   DVT Prophylaxis: Heparin.  Family Communication: I discussed the plan with the patient and patient's wife at the bedside.   Disposition Plan: Home when medically stable.  Time spent: 60 minutes.  Doree Albee [facility] Pager [phone].

## 2014-05-20 NOTE — ED Notes (Signed)
Pt resting quietly at this time.  Will continue to monitor.

## 2014-05-20 NOTE — ED Notes (Signed)
Pt sleeping at this time.  Wife states that he has voiced no further complaints.

## 2014-05-21 ENCOUNTER — Observation Stay (HOSPITAL_COMMUNITY): Payer: Medicare Other

## 2014-05-21 DIAGNOSIS — R531 Weakness: Secondary | ICD-10-CM | POA: Diagnosis not present

## 2014-05-21 DIAGNOSIS — E860 Dehydration: Secondary | ICD-10-CM | POA: Diagnosis not present

## 2014-05-21 DIAGNOSIS — I429 Cardiomyopathy, unspecified: Secondary | ICD-10-CM

## 2014-05-21 DIAGNOSIS — I10 Essential (primary) hypertension: Secondary | ICD-10-CM | POA: Diagnosis not present

## 2014-05-21 DIAGNOSIS — N281 Cyst of kidney, acquired: Secondary | ICD-10-CM | POA: Diagnosis not present

## 2014-05-21 DIAGNOSIS — N170 Acute kidney failure with tubular necrosis: Secondary | ICD-10-CM | POA: Diagnosis not present

## 2014-05-21 DIAGNOSIS — N184 Chronic kidney disease, stage 4 (severe): Secondary | ICD-10-CM | POA: Diagnosis not present

## 2014-05-21 DIAGNOSIS — I5022 Chronic systolic (congestive) heart failure: Secondary | ICD-10-CM | POA: Diagnosis not present

## 2014-05-21 DIAGNOSIS — E11319 Type 2 diabetes mellitus with unspecified diabetic retinopathy without macular edema: Secondary | ICD-10-CM | POA: Diagnosis not present

## 2014-05-21 LAB — COMPREHENSIVE METABOLIC PANEL
ALBUMIN: 3 g/dL — AB (ref 3.5–5.0)
ALK PHOS: 56 U/L (ref 38–126)
ALT: 9 U/L — ABNORMAL LOW (ref 17–63)
ANION GAP: 11 (ref 5–15)
AST: 15 U/L (ref 15–41)
BUN: 114 mg/dL — ABNORMAL HIGH (ref 6–20)
CO2: 25 mmol/L (ref 22–32)
Calcium: 8.5 mg/dL — ABNORMAL LOW (ref 8.9–10.3)
Chloride: 102 mmol/L (ref 101–111)
Creatinine, Ser: 5.52 mg/dL — ABNORMAL HIGH (ref 0.61–1.24)
GFR calc Af Amer: 11 mL/min — ABNORMAL LOW (ref >60)
GFR calc non Af Amer: 9 mL/min — ABNORMAL LOW (ref >60)
Glucose, Bld: 127 mg/dL — ABNORMAL HIGH (ref 70–99)
POTASSIUM: 3.7 mmol/L (ref 3.5–5.1)
SODIUM: 138 mmol/L (ref 135–145)
Total Bilirubin: 0.6 mg/dL (ref 0.3–1.2)
Total Protein: 5.9 g/dL — ABNORMAL LOW (ref 6.5–8.1)

## 2014-05-21 LAB — GLUCOSE, CAPILLARY
GLUCOSE-CAPILLARY: 124 mg/dL — AB (ref 70–99)
GLUCOSE-CAPILLARY: 136 mg/dL — AB (ref 70–99)
GLUCOSE-CAPILLARY: 108 mg/dL — AB (ref 70–99)
Glucose-Capillary: 145 mg/dL — ABNORMAL HIGH (ref 70–99)

## 2014-05-21 LAB — CBC
HEMATOCRIT: 39.6 % (ref 39.0–52.0)
HEMOGLOBIN: 13.1 g/dL (ref 13.0–17.0)
MCH: 29.8 pg (ref 26.0–34.0)
MCHC: 33.1 g/dL (ref 30.0–36.0)
MCV: 90 fL (ref 78.0–100.0)
Platelets: 176 10*3/uL (ref 150–400)
RBC: 4.4 MIL/uL (ref 4.22–5.81)
RDW: 14.7 % (ref 11.5–15.5)
WBC: 4.8 10*3/uL (ref 4.0–10.5)

## 2014-05-21 LAB — CREATININE, URINE, RANDOM: Creatinine, Urine: 66.28 mg/dL

## 2014-05-21 LAB — TSH: TSH: 0.915 u[IU]/mL (ref 0.350–4.500)

## 2014-05-21 LAB — TROPONIN I: Troponin I: 0.09 ng/mL — ABNORMAL HIGH (ref <0.031)

## 2014-05-21 LAB — SODIUM, URINE, RANDOM: Sodium, Ur: 53 mmol/L

## 2014-05-21 MED ORDER — BISACODYL 5 MG PO TBEC
10.0000 mg | DELAYED_RELEASE_TABLET | Freq: Once | ORAL | Status: AC
  Administered 2014-05-21: 10 mg via ORAL
  Filled 2014-05-21: qty 2

## 2014-05-21 NOTE — Care Management Note (Signed)
Case Management Note  Patient Details  Name: [name] MRN: [id number] Date of Birth: 02/12/1945   Expected Discharge Date:  05/22/14               Expected Discharge Plan:  Home/Self Care  In-House Referral:  NA  Discharge planning Services  CM Consult  Post Acute Care Choice:  NA Choice offered to:  NA  DME Arranged:    DME Agency:     HH Arranged:    [place] Agency:     Status of Service:  Completed, signed off  Medicare Important Message Given:    Date Medicare IM Given:    Medicare IM give by:    Date Additional Medicare IM Given:    Additional Medicare Important Message give by:     If discussed at [place] of Stay Meetings, dates discussed:    Additional Comments: Pt is from home, lives with wife. Pt independent at baseline. Pt has no HH services or DME's prior to admission. Pt plans to discharge home with self care. No CM needs.   Sherald Barge, RN 05/21/2014, 1:10 PM

## 2014-05-21 NOTE — Progress Notes (Signed)
[facility] PROGRESS NOTE  [name] NWG:[id number] DOB: 10/17/1945 DOA: 05/20/2014 PCP: Jenna Luo TOM, MD  Assessment/Plan: Generalized weakness -Suspect related to mild dehydration on top of severe systolic CHF. -He feels improved with IV fluids. -We'll continue gentle IV fluid repletion today. -Diuretic dose has been decreased temporarily. -We'll request PT evaluation in the morning. -We'll check TSH, B-12.  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 -CHF appears compensated at present. -Appreciate cardiology following.  Acute on chronic kidney disease stage IV -Baseline creatinine appears to be around 4. -some worsening today to 5.5, however no indications for acute dialysis. -We expect to see some improvement tomorrow with IV fluids. -Appreciate nephrology input and recommendations.  Hypokalemia -Repleted.  Type 2 diabetes -Well-controlled. -Continue current regimen.  Code Status: Full code Family Communication: Patient only  Disposition Plan: Likely home and ready in 24-48 hours   Consultants:  Cardiology, Dr. Domenic Polite  Nephrology, Dr. Theador Hawthorne   Antibiotics:  None   Subjective: Still feels weak although improved from admission. Has had somewhat of an appetite today.  Objective: Filed Vitals:   05/21/14 0624 05/21/14 0700 05/21/14 0922 05/21/14 1348  BP: 163/41  162/45 128/37  Pulse: 66   60  Temp: 97.6 F (36.4 C)   97.7 F (36.5 C)  TempSrc: Oral   Oral  Resp: 16   18  Height:      Weight:  80.8 kg (178 lb 2.1 oz)    SpO2: 99%   100%    Intake/Output Summary (Last 24 hours) at 05/21/14 1806 Last data filed at 05/21/14 1347  Gross per 24 hour  Intake    830 ml  Output      0 ml  Net    830 ml   Filed Weights   05/20/14 1756 05/21/14 0700  Weight: 81.965 kg (180 lb 11.2 oz) 80.8 kg (178 lb 2.1 oz)    Exam:   General:  Alert, awake, oriented 3, no current  distress.  Cardiovascular: Regular rate and rhythm, systolic murmur  Respiratory: Mild bilateral wheezing  Abdomen: Soft, nontender, nondistended, positive bowel sounds  Extremities: No clubbing, cyanosis or edema, positive pulses   Neurologic:  Grossly intact, nonfocal.  Data Reviewed: Basic Metabolic Panel:  Recent Labs Lab 05/20/14 1158 05/21/14 0355  NA 141 138  K 3.1* 3.7  CL 98* 102  CO2 28 25  GLUCOSE 141* 127*  BUN 120* 114*  CREATININE 4.88* 5.52*  CALCIUM 9.0 8.5*   Liver Function Tests:  Recent Labs Lab 05/20/14 1158 05/21/14 0355  AST 18 15  ALT 12* 9*  ALKPHOS 65 56  BILITOT 0.9 0.6  PROT 6.6 5.9*  ALBUMIN 3.5 3.0*    Recent Labs Lab 05/20/14 1158  LIPASE 33   No results for input(s): AMMONIA in the last 168 hours. CBC:  Recent Labs Lab 05/20/14 1158 05/21/14 0355  WBC 5.4 4.8  NEUTROABS 4.3  --   HGB 14.4 13.1  HCT 42.9 39.6  MCV 89.0 90.0  PLT 166 176   Cardiac Enzymes:  Recent Labs Lab 05/20/14 1158 05/20/14 1619 05/20/14 2147 05/21/14 0355  TROPONINI 0.10* 0.11* 0.12* 0.09*   BNP (last 3 results)  Recent Labs  01/22/14 2302  BNP >4500.0*    ProBNP (last 3 results)  Recent Labs  11/02/13 2047 12/01/13 0823  PROBNP 62727.0* 37333.0*    CBG:  Recent Labs Lab 05/20/14 1756 05/20/14 2231 05/21/14 0752 05/21/14 1115 05/21/14 1701  GLUCAP  138* 186* 124* 136* 145*    No results found for this or any previous visit (from the past 240 hour(s)).   Studies: Dg Chest 2 View  05/20/2014   CLINICAL DATA:  Cough and weakness for 1 week  EXAM: CHEST  2 VIEW  COMPARISON:  05/16/2014  FINDINGS: Cardiac shadow is within normal limits. The lungs are well aerated bilaterally. No focal infiltrate is seen. No effusion is noted. Degenerative changes of the thoracic spine are again seen.  IMPRESSION: No acute abnormality noted.   Electronically Signed   By: Inez Catalina M.D.   On: 05/20/2014 14:02   US Renal  05/21/2014    CLINICAL DATA:  Atn  EXAM: RENAL / URINARY TRACT ULTRASOUND COMPLETE  COMPARISON:  05/06/2011  FINDINGS: Right Kidney:  Length: 12.4 cm. Diffuse increased echotexture throughout the right kidney. Multiple small cysts, the largest 2 cm in the midpole. No hydronephrosis.  Left Kidney:  Length: 12.6 cm. Diffusely increased echotexture. No hydronephrosis. Multiple small cysts, the largest measuring 2.2 cm in the upper pole. Complex hypoechoic area noted in the parapelvic midpole region of the left kidney measuring 1.9 x 1.8 x 1.4 cm, not a simple cyst.  Bladder:  Appears normal for degree of bladder distention.  IMPRESSION: Increased echotexture within the kidneys bilaterally compatible chronic medical renal disease.  Multiple bilateral renal cysts.  Complex hypoechoic area within the midpole of the left kidney. While this could reflect a complex benign cyst, I cannot completely exclude solid or cystic renal neoplasm. This could be further evaluated with renal protocol MRI.   Electronically Signed   By: Rolm Baptise M.D.   On: 05/21/2014 16:52    Scheduled Meds: . amLODipine  10 mg Oral Daily  . aspirin EC  81 mg Oral Daily  . carvedilol  12.5 mg Oral BID WC  . cloNIDine  0.3 mg Oral BID  . febuxostat  40 mg Oral Daily  . heparin  5,000 Units Subcutaneous 3 times per day  . hydrALAZINE  50 mg Oral BID  . insulin aspart  0-5 Units Subcutaneous QHS  . insulin aspart  0-9 Units Subcutaneous TID WC  . insulin glargine  30 Units Subcutaneous Q2200  . potassium chloride SA  20 mEq Oral TID  . sodium chloride  3 mL Intravenous Q12H  . torsemide  50 mg Oral BID   Continuous Infusions:   Active Problems:   Diabetes mellitus type 2 with retinopathy   Essential hypertension   Chronic kidney disease, stage 4, severely decreased GFR   Chronic systolic congestive heart failure   Nonischemic cardiomyopathy   Weakness    Time spent: 35 minutes. Greater than 50% of this time was spent in direct contact with  the patient coordinating care.    Lelon Frohlich  [facility] Pager [phone]  If 7PM-7AM, please contact night-coverage at [email or web address], password [facility] 05/21/2014, 6:06 PM  LOS: 1 day

## 2014-05-21 NOTE — Consult Note (Signed)
[name] MRN: [id number] DOB/AGE: 06/04/1945 69 y.o. Primary Care Physician:[name],[name], MD Admit date: 05/20/2014 Chief Complaint:  Chief Complaint  Patient presents with  . Abdominal Pain   HPI: Pt is 69 year old male with past medicak of CHF, DM who came to ER with c/o wekaness  HPI dates back to past few days when pt started feeling progressively weak. Pt was seen by pcp as was started on Antibiotics with presumptive dx of pneumonia But pt continue to feel weak and later pt had a fall Pt came to ER and was admitted for further work up Pt seen today says that he is feeling better.  No c/o head trauma  No c/o loss of consciousness  N c/o chest pain  NO c/o fever/cough/chill  No c/o hematuria  No c/o hematemesis  No c/o change in speech/vision  Past Medical History  Diagnosis Date  . Chronic combined systolic and diastolic CHF (congestive heart failure)     a. 02/2014 Echo: EF 20-25% (new), Gr 2 DD, mod conc LVH, mild AI/AS, mod MR, sev dil LA, mild TR.  Marland Kitchen Nonischemic cardiomyopathy     a. 02/2014 Echo: EF 20-25%;  b. 03/2014 Cath: LM nl, LAD min irregs, LCX   . 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 Hypertension   . Chronic kidney disease (CKD), stage IV (severe) 12/2009    a. baseline creat now ~ 4.  . Erectile dysfunction   . Colonic polyp   . Low serum testosterone level   . Tobacco abuse   . Sleep apnea 2010    Initially unable to afford CPAP  . Benign prostatic hypertrophy   . Obesity, Class I, BMI 30-34.9   . Hyperlipidemia   . Congestive heart failure (CHF)     EF 25-30% 07/2011  . Adenomatous colon polyp 2006  . H. pylori infection 2013    treated with prevpac  . Mild Ao Stenosis and Insufficiency         Family History  Problem Relation Age of Onset  . Hypertension Mother   . Cancer Mother   . Cancer Father   . Diabetes Sister     Social History:  reports that he has been smoking Cigarettes.  He started smoking  about 56 years ago. He has a 11.25 pack-year smoking history. He uses smokeless tobacco. He reports that he drinks about 2.4 oz of alcohol per week. He reports that he does not use illicit drugs.   Allergies:  Allergies  Allergen Reactions  . Ace Inhibitors Cough    Medications Prior to Admission  Medication Sig Dispense Refill  . amLODipine (NORVASC) 10 MG tablet Take 1 tablet (10 mg total) by mouth daily. 30 tablet 6  . aspirin EC 81 MG tablet Take 81 mg by mouth daily.    . carvedilol (COREG) 12.5 MG tablet Take 1 tablet (12.5 mg total) by mouth 2 (two) times daily with a meal. 180 tablet 3  . cefUROXime (CEFTIN) 500 MG tablet Take 1 tablet (500 mg total) by mouth 2 (two) times daily with a meal. 20 tablet 0  . cloNIDine (CATAPRES) 0.3 MG tablet Take 0.3 mg by mouth 2 (two) times daily.     . diphenhydramine-acetaminophen (TYLENOL PM) 25-500 MG TABS Take 1 tablet by mouth at bedtime as needed (sleep/ pain).    . febuxostat (ULORIC) 40 MG tablet Take 1 tablet (40 mg total) by mouth daily. 30 tablet  2  . hydrALAZINE (APRESOLINE) 50 MG tablet Take 1 tablet (50 mg total) by mouth 2 (two) times daily. 180 tablet 3  . Insulin Glargine (LANTUS SOLOSTAR) 100 UNIT/ML Solostar Pen Inject 30 Units into the skin daily at 10 pm. 5 pen 3  . metolazone (ZAROXOLYN) 2.5 MG tablet 2.5 mg Daily x 3 days and then 2.5 mg every Monday, Wednesday, and Friday. 30 tablet 3  . potassium chloride SA (K-DUR,KLOR-CON) 20 MEQ tablet Take 1 tablet (20 mEq total) by mouth 3 (three) times daily. 90 tablet 3  . torsemide (DEMADEX) 100 MG tablet Take 100 mg by mouth 2 (two) times daily.     Marland Kitchen azithromycin (ZITHROMAX) 250 MG tablet 2 tabs poqday1, 1 tab poqday 2-5 (Patient not taking: Reported on 05/20/2014) 6 tablet 0  . ONE TOUCH ULTRA TEST test strip Pt checks BS 6 x day - e11.9 (Patient not taking: Reported on 05/20/2014) 200 each 5       MVE:HMCNO from the symptoms mentioned above,there are no other symptoms referable  to all systems reviewed.  Marland Kitchen amLODipine  10 mg Oral Daily  . aspirin EC  81 mg Oral Daily  . carvedilol  12.5 mg Oral BID WC  . cloNIDine  0.3 mg Oral BID  . febuxostat  40 mg Oral Daily  . heparin  5,000 Units Subcutaneous 3 times per day  . hydrALAZINE  50 mg Oral BID  . insulin aspart  0-5 Units Subcutaneous QHS  . insulin aspart  0-9 Units Subcutaneous TID WC  . insulin glargine  30 Units Subcutaneous Q2200  . potassium chloride SA  20 mEq Oral TID  . sodium chloride  3 mL Intravenous Q12H  . torsemide  50 mg Oral BID     Physical Exam: Vital signs in last 24 hours: Temp:  [97.6 F (36.4 C)-99.2 F (37.3 C)] 97.7 F (36.5 C) (05/04 1348) Pulse Rate:  [60-81] 60 (05/04 1348) Resp:  [16-18] 18 (05/04 1348) BP: (128-190)/(37-52) 128/37 mmHg (05/04 1348) SpO2:  [97 %-100 %] 100 % (05/04 1348) Weight:  [178 lb 2.1 oz (80.8 kg)-180 lb 11.2 oz (81.965 kg)] 178 lb 2.1 oz (80.8 kg) (05/04 0700) Weight change:  Last BM Date: 05/06/14 (Pt. states)  Intake/Output from previous day: 05/03 0701 - 05/04 0700 In: 350 [I.V.:350] Out: -  Total I/O In: 480 [P.O.:480] Out: -    Physical Exam: General- pt is awake,alert, oriented to time place and person Resp- No acute REsp distress, CTA B/L NO Rhonchi CVS- S1S2 regular in rate and rhythm GIT- BS+, soft, NT, ND EXT- NO LE Edema, NO Cyanosis CNS- CN 2-12 grossly intact. Moving all 4 extremities Psych- normal mood and affect    Lab Results: CBC  Recent Labs  05/20/14 1158 05/21/14 0355  WBC 5.4 4.8  HGB 14.4 13.1  HCT 42.9 39.6  PLT 166 176    BMET  Recent Labs  05/20/14 1158 05/21/14 0355  NA 141 138  K 3.1* 3.7  CL 98* 102  CO2 28 25  GLUCOSE 141* 127*  BUN 120* 114*  CREATININE 4.88* 5.52*  CALCIUM 9.0 8.5*   Creat trend 2016  4.88=>5.5        Baseline 4 2015  4.1--5.6 2014  3.8 2013   2.6--5.8 ( AKI ) 2012   2.2--2.6 2011   2.2--3.0 2010   2.0--2.4 2009   1.4--2.2   MICRO No results found  for this or any previous visit (from the past 240  hour(s)).    Lab Results  Component Value Date   CALCIUM 8.5* 05/21/2014   PHOS 4.0 03/20/2014      Impression: 1)Renal  AKI secondary to Prerenal/ATN                AKI on CKD               CKD stage 4.               CKD since 2009               CKD secondary to Multiple factors                           DM                            HTN                           Cardiorenal                             Obesity realted                Progression of CKD marked with AKI                   2)HTN  Bp was on higher side, now better  Medication- On Diuretics On Calcium Channel Blockers On Alpha and beta Blockers On Vasodilators On Central Acting Sympatholytics  3)Anemia HGb at goal (9--11)   4)CKD Mineral-Bone Disorder  Phosphorus at goal. Calcium at goal.  5)MSK- admitted with Weakness  Primary MD following  6)Electrolytes Hypokalemic - now better  Normonatremic   7)Acid base Co2 at goal   8) CVs trops high    Cardiology folowing  Plan:  Agree with current tx and plan ( reduction od diuretics) Will ask for FENA Will ask for renal u/s to r/o post renal causes Will repeat BMet Educated pt and family about worsening  of GFR No need of renal replacement therapy .     [place] S 05/21/2014, 3:40 PM

## 2014-05-21 NOTE — Consult Note (Addendum)
Reason for Consult: Weakness, near syncope Referring Physician: PTH PCP: Odette Fraction, MD  Cardiologist: Kate Sable, MD  [name] is an 69 y.o. male patient of Dr.Koneswaran with history of nonischemic cardiomyopathy, EF 20-25% with grade 2 diastolic dysfunction on recent 2-D echo. 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 He was given a Lifevest ICD with plans for implantable ICD evaluation (appt next Thurs with Dr. Lovena Le). He has symptomatic PVCs and NSVT maintained on beta blocker. He also has stage IV chronic kidney disease, hypertension, hyperlipidemia, diabetes mellitus and continues to smoke.   Patient was recently treated for infection, possible pneumonia as an outpatient with antibiotics by his primary provider. He has had decreased appetite and food intake, nausea and vomiting since Sunday night and has become very weak. On the day of admission he apparently slumped to the floor while getting dressed in the bathroom, observed by his wife, did not have frank syncope. Does not report any palpitations or Lifevest discharge. Troponin is minimally elevated but flat.Marland Kitchen He is being given IV fluids with concern for relative dehydration in light of his nausea and emesis, diuretics have been reduced. He is eating now and feeling better. He continues to smoke 1 pack/every 3 days. Trying to cut back.   Past Medical History  Diagnosis Date  . Chronic combined systolic and diastolic CHF (congestive heart failure)     a. 02/2014 Echo: EF 20-25% (new), Gr 2 DD, mod conc LVH, mild AI/AS, mod MR, sev dil LA, mild TR.  Marland Kitchen Nonischemic cardiomyopathy     a. 02/2014 Echo: EF 20-25%;  b. 03/2014 Cath: LM nl, LAD min irregs, LCX   . 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 Hypertension   . Chronic kidney disease (CKD), stage IV (severe) 12/2009    a. baseline creat now ~ 4.  . Erectile dysfunction   . Colonic polyp   . Low serum testosterone  level   . Tobacco abuse   . Sleep apnea 2010    Initially unable to afford CPAP  . Benign prostatic hypertrophy   . Obesity, Class I, BMI 30-34.9   . Hyperlipidemia   . Congestive heart failure (CHF)     EF 25-30% 07/2011  . Adenomatous colon polyp 2006  . H. pylori infection 2013    treated with prevpac  . Mild Ao Stenosis and Insufficiency     Past Surgical History  Procedure Laterality Date  . Retinal laser procedure      diabetic retinopathy  . Lymph node biopsy      surgical exploration of neck-not entirely clear that this represented a lymph node biopsy  . Colonoscopy  08/23/2004    Dr. Vivi Ferns rectum, diminutive polyp of the rectosigmoid removed, inflamed focally adenomatous polyp  . Left and right heart catheterization with coronary angiogram N/A 03/19/2014    Procedure: LEFT AND RIGHT HEART CATHETERIZATION WITH CORONARY ANGIOGRAM;  Surgeon: Blane Ohara, MD;  Location: [facility] CATH LAB;  Service: Cardiovascular;  Laterality: N/A;    Family History  Problem Relation Age of Onset  . Hypertension Mother   . Cancer Mother   . Cancer Father   . Diabetes Sister     Social History:  reports that he has been smoking Cigarettes.  He started smoking about 56 years ago. He has a 11.25 pack-year smoking history. He uses smokeless tobacco. He reports that he drinks about 2.4 oz of alcohol per week.  He reports that he does not use illicit drugs.  Allergies:  Allergies  Allergen Reactions  . Ace Inhibitors Cough    Medications:  Scheduled Meds: . amLODipine  10 mg Oral Daily  . aspirin EC  81 mg Oral Daily  . carvedilol  12.5 mg Oral BID WC  . cloNIDine  0.3 mg Oral BID  . febuxostat  40 mg Oral Daily  . heparin  5,000 Units Subcutaneous 3 times per day  . hydrALAZINE  50 mg Oral BID  . insulin aspart  0-5 Units Subcutaneous QHS  . insulin aspart  0-9 Units Subcutaneous TID WC  . insulin glargine  30 Units Subcutaneous Q2200  . potassium chloride SA  20 mEq Oral TID   . sodium chloride  3 mL Intravenous Q12H  . torsemide  50 mg Oral BID   Continuous Infusions: . sodium chloride 50 mL/hr at 05/20/14 1800   PRN Meds:.ondansetron **OR** ondansetron (ZOFRAN) IV   Results for orders placed or performed during the hospital encounter of 05/20/14 (from the past 48 hour(s))  Urinalysis, Routine w reflex microscopic     Status: Abnormal   Collection Time: 05/20/14 11:46 AM  Result Value Ref Range   Color, Urine YELLOW YELLOW   APPearance CLEAR CLEAR   Specific Gravity, Urine 1.020 1.005 - 1.030   pH 7.0 5.0 - 8.0   Glucose, UA NEGATIVE NEGATIVE mg/dL   Hgb urine dipstick SMALL (A) NEGATIVE   Bilirubin Urine NEGATIVE NEGATIVE   Ketones, ur NEGATIVE NEGATIVE mg/dL   Protein, ur 100 (A) NEGATIVE mg/dL   Urobilinogen, UA 0.2 0.0 - 1.0 mg/dL   Nitrite NEGATIVE NEGATIVE   Leukocytes, UA NEGATIVE NEGATIVE  Urine microscopic-add on     Status: None   Collection Time: 05/20/14 11:46 AM  Result Value Ref Range   RBC / HPF 3-6 <3 RBC/hpf   Bacteria, UA RARE RARE  CBC with Differential/Platelet     Status: Abnormal   Collection Time: 05/20/14 11:58 AM  Result Value Ref Range   WBC 5.4 4.0 - 10.5 K/uL   RBC 4.82 4.22 - 5.81 MIL/uL   Hemoglobin 14.4 13.0 - 17.0 g/dL   HCT 42.9 39.0 - 52.0 %   MCV 89.0 78.0 - 100.0 fL   MCH 29.9 26.0 - 34.0 pg   MCHC 33.6 30.0 - 36.0 g/dL   RDW 14.6 11.5 - 15.5 %   Platelets 166 150 - 400 K/uL   Neutrophils Relative % 79 (H) 43 - 77 %   Neutro Abs 4.3 1.7 - 7.7 K/uL   Lymphocytes Relative 11 (L) 12 - 46 %   Lymphs Abs 0.6 (L) 0.7 - 4.0 K/uL   Monocytes Relative 9 3 - 12 %   Monocytes Absolute 0.5 0.1 - 1.0 K/uL   Eosinophils Relative 1 0 - 5 %   Eosinophils Absolute 0.1 0.0 - 0.7 K/uL   Basophils Relative 0 0 - 1 %   Basophils Absolute 0.0 0.0 - 0.1 K/uL  Comprehensive metabolic panel     Status: Abnormal   Collection Time: 05/20/14 11:58 AM  Result Value Ref Range   Sodium 141 135 - 145 mmol/L   Potassium 3.1 (L)  3.5 - 5.1 mmol/L   Chloride 98 (L) 101 - 111 mmol/L   CO2 28 22 - 32 mmol/L   Glucose, Bld 141 (H) 70 - 99 mg/dL   BUN 120 (H) 6 - 20 mg/dL    Comment: RESULTS CONFIRMED BY MANUAL DILUTION CORRECTED  ON 05/03 AT 1315: PREVIOUSLY REPORTED AS >100 RESULTS CONFIRMED BY MANUAL DILUTION    Creatinine, Ser 4.88 (H) 0.61 - 1.24 mg/dL   Calcium 9.0 8.9 - 10.3 mg/dL   Total Protein 6.6 6.5 - 8.1 g/dL   Albumin 3.5 3.5 - 5.0 g/dL   AST 18 15 - 41 U/L   ALT 12 (L) 17 - 63 U/L   Alkaline Phosphatase 65 38 - 126 U/L   Total Bilirubin 0.9 0.3 - 1.2 mg/dL   GFR calc non Af Amer 11 (L) >60 mL/min   GFR calc Af Amer 13 (L) >60 mL/min    Comment: (NOTE) The eGFR has been calculated using the CKD EPI equation. This calculation has not been validated in all clinical situations. eGFR's persistently <90 mL/min signify possible Chronic Kidney Disease. CORRECTED ON 05/03 AT 1315: PREVIOUSLY REPORTED AS 13    Anion gap 15 5 - 15  Lipase, blood     Status: None   Collection Time: 05/20/14 11:58 AM  Result Value Ref Range   Lipase 33 22 - 51 U/L  Troponin I     Status: Abnormal   Collection Time: 05/20/14 11:58 AM  Result Value Ref Range   Troponin I 0.10 (H) <0.031 ng/mL    Comment:        PERSISTENTLY INCREASED TROPONIN VALUES IN THE RANGE OF 0.04-0.49 ng/mL CAN BE SEEN IN:       -UNSTABLE ANGINA       -CONGESTIVE HEART FAILURE       -MYOCARDITIS       -CHEST TRAUMA       -ARRYHTHMIAS       -LATE PRESENTING MYOCARDIAL INFARCTION       -COPD   CLINICAL FOLLOW-UP RECOMMENDED.   Troponin I     Status: Abnormal   Collection Time: 05/20/14  4:19 PM  Result Value Ref Range   Troponin I 0.11 (H) <0.031 ng/mL    Comment:        PERSISTENTLY INCREASED TROPONIN VALUES IN THE RANGE OF 0.04-0.49 ng/mL CAN BE SEEN IN:       -UNSTABLE ANGINA       -CONGESTIVE HEART FAILURE       -MYOCARDITIS       -CHEST TRAUMA       -ARRYHTHMIAS       -LATE PRESENTING MYOCARDIAL INFARCTION       -COPD    CLINICAL FOLLOW-UP RECOMMENDED.   Glucose, capillary     Status: Abnormal   Collection Time: 05/20/14  5:56 PM  Result Value Ref Range   Glucose-Capillary 138 (H) 70 - 99 mg/dL   Comment 1 Notify RN    Comment 2 Document in Chart   Troponin I     Status: Abnormal   Collection Time: 05/20/14  9:47 PM  Result Value Ref Range   Troponin I 0.12 (H) <0.031 ng/mL    Comment:        PERSISTENTLY INCREASED TROPONIN VALUES IN THE RANGE OF 0.04-0.49 ng/mL CAN BE SEEN IN:       -UNSTABLE ANGINA       -CONGESTIVE HEART FAILURE       -MYOCARDITIS       -CHEST TRAUMA       -ARRYHTHMIAS       -LATE PRESENTING MYOCARDIAL INFARCTION       -COPD   CLINICAL FOLLOW-UP RECOMMENDED.   Glucose, capillary     Status: Abnormal   Collection Time: 05/20/14 10:31 PM  Result Value Ref Range   Glucose-Capillary 186 (H) 70 - 99 mg/dL   Comment 1 Notify RN   Troponin I     Status: Abnormal   Collection Time: 05/21/14  3:55 AM  Result Value Ref Range   Troponin I 0.09 (H) <0.031 ng/mL    Comment:        PERSISTENTLY INCREASED TROPONIN VALUES IN THE RANGE OF 0.04-0.49 ng/mL CAN BE SEEN IN:       -UNSTABLE ANGINA       -CONGESTIVE HEART FAILURE       -MYOCARDITIS       -CHEST TRAUMA       -ARRYHTHMIAS       -LATE PRESENTING MYOCARDIAL INFARCTION       -COPD   CLINICAL FOLLOW-UP RECOMMENDED.   Comprehensive metabolic panel     Status: Abnormal   Collection Time: 05/21/14  3:55 AM  Result Value Ref Range   Sodium 138 135 - 145 mmol/L   Potassium 3.7 3.5 - 5.1 mmol/L   Chloride 102 101 - 111 mmol/L   CO2 25 22 - 32 mmol/L   Glucose, Bld 127 (H) 70 - 99 mg/dL   BUN 114 (H) 6 - 20 mg/dL    Comment: RESULTS CONFIRMED BY MANUAL DILUTION   Creatinine, Ser 5.52 (H) 0.61 - 1.24 mg/dL   Calcium 8.5 (L) 8.9 - 10.3 mg/dL   Total Protein 5.9 (L) 6.5 - 8.1 g/dL   Albumin 3.0 (L) 3.5 - 5.0 g/dL   AST 15 15 - 41 U/L   ALT 9 (L) 17 - 63 U/L   Alkaline Phosphatase 56 38 - 126 U/L   Total Bilirubin 0.6 0.3  - 1.2 mg/dL   GFR calc non Af Amer 9 (L) >60 mL/min   GFR calc Af Amer 11 (L) >60 mL/min    Comment: (NOTE) The eGFR has been calculated using the CKD EPI equation. This calculation has not been validated in all clinical situations. eGFR's persistently <90 mL/min signify possible Chronic Kidney Disease.    Anion gap 11 5 - 15  CBC     Status: None   Collection Time: 05/21/14  3:55 AM  Result Value Ref Range   WBC 4.8 4.0 - 10.5 K/uL   RBC 4.40 4.22 - 5.81 MIL/uL   Hemoglobin 13.1 13.0 - 17.0 g/dL   HCT 39.6 39.0 - 52.0 %   MCV 90.0 78.0 - 100.0 fL   MCH 29.8 26.0 - 34.0 pg   MCHC 33.1 30.0 - 36.0 g/dL   RDW 14.7 11.5 - 15.5 %   Platelets 176 150 - 400 K/uL  Glucose, capillary     Status: Abnormal   Collection Time: 05/21/14  7:52 AM  Result Value Ref Range   Glucose-Capillary 124 (H) 70 - 99 mg/dL    Dg Chest 2 View  05/20/2014   CLINICAL DATA:  Cough and weakness for 1 week  EXAM: CHEST  2 VIEW  COMPARISON:  05/16/2014  FINDINGS: Cardiac shadow is within normal limits. The lungs are well aerated bilaterally. No focal infiltrate is seen. No effusion is noted. Degenerative changes of the thoracic spine are again seen.  IMPRESSION: No acute abnormality noted.   Electronically Signed   By: Inez Catalina M.D.   On: 05/20/2014 14:02    ROS  See HPI Eyes: Negative Ears:Negative for hearing loss, tinnitus Cardiovascular: Negative for chest pain, palpitations,irregular heartbeat,  near-syncope, orthopnea, paroxysmal nocturnal dyspnea and syncope,edema, claudication, cyanosis,.  Respiratory:  Positive for cough,  shortness of breath, sleep disturbances due to breathing, sputum production and wheezing.   Endocrine: Negative for cold intolerance and heat intolerance.  Hematologic/Lymphatic: Negative for adenopathy and bleeding problem. Does not bruise/bleed easily.  Musculoskeletal: Negative.   Gastrointestinal: Positive for nausea, vomiting, reflux  Genitourinary: Negative for bladder  incontinence, dysuria, flank pain, frequency, hematuria, hesitancy, nocturia and urgency.  Neurological: Negative.  Allergic/Immunologic: Negative for environmental allergies.  Blood pressure 162/45, pulse 66, temperature 97.6 F (36.4 C), temperature source Oral, resp. rate 16, height 5' 9"  (1.753 m), weight 178 lb 2.1 oz (80.8 kg), SpO2 99 %. Physical Exam PHYSICAL EXAM: Well-nournished, in no acute distress. Neck: No JVD, HJR, Bruit, or thyroid enlargement, murmur portrayed in carotids. Lungs: Decreased breath sounds with diffuse wheezing throughout. Cardiovascular: RRR, PMI not displaced, 2/6 systolic murmur LSB and diastolic murmur, no gallops, bruit, thrill, or heave. Abdomen: BS normal. Soft without organomegaly, masses, lesions or tenderness. Extremities: without cyanosis, clubbing or edema. Good distal pulses bilateral SKin: Warm, no lesions or rashes  Musculoskeletal: No deformities Neuro: no focal signs  Recent cardiac tests: 2-D echo 03/07/14 Study Conclusions  - Left ventricle: The cavity size was mildly dilated. There was   moderate concentric hypertrophy. Systolic function was severely   reduced. The estimated ejection fraction was in the range of 20%   to 25%. Severe global hypokinesis is noted. Features are   consistent with a pseudonormal left ventricular filling pattern,   with concomitant abnormal relaxation and increased filling   pressure (grade 2 diastolic dysfunction). Doppler parameters are   consistent with high ventricular filling pressure. - Aortic valve: Mildly calcified annulus. Trileaflet; mildly   thickened leaflets. There was mild regurgitation. There is mild   aortic stenosis. Peak velocity 2.6 m/s. Mean gradient (S): 11 mm   Hg. - Mitral valve: Mildly thickened, mildly calcified leaflets .   Restricted leaflet mobility due to left ventricular dysfunction.   There was moderate regurgitation. - Left atrium: The atrium was severely dilated.  Volume/bsa, ES,   (1-plane Simpson&'s, A2C): 43.9 ml/m^2. - Right ventricle: Systolic function was mildly reduced. - Right atrium: The atrium was mildly dilated. - Atrial septum: There was a patent foramen ovale. - Tricuspid valve: There was mild regurgitation.  Impressions:  - When compare to the report dated 07/16/12, left ventricular   systolic function has severely declined.   Myoview 03/07/14 IMPRESSION: 1. Moderate degree of inferior wall scar. However, overlying soft tissue attenuation cannot entirely be ruled out.   2. Severe global hypokinesis with the inferior wall more akinetic.   3. Left ventricular ejection fraction 20%   4. High-risk stress test findings* primarily based on severe left ventricular dysfunction.   Right and left heart catheterization 03/19/14: Final Conclusions:   1. Widely patent coronary arteries with minimal irregularity noted 2. Congestive heart failure secondary to nonischemic cardiomyopathy 3. Hemodynamic findings demonstrating preserved cardiac output but elevated left and right heart filling pressures   EKG: NSR with LVH and nonspecific ST changes.     Assessment/Plan: Weakness/dehydration: Currently torsemide dose cut in half, metolazone held. He is receiving gentle IV fluids. Would probably d/c IV fluids since eating now. Was on demadex 100 mg BID at home.  Will need early f/u in our office next week.  COPD: patient is wheezing and could use nebulizers and inhalers.  Nonischemic cardiomyopathy EF 20-25% on most recent echo, nonobstructive CAD on cath in 03/2014  Elevated troponins probably secondary to severe LV dysfunction  Diabetes mellitus  Recent infection possibly pneumonia treated as an outpatient  Frequent PVCs/nonsustained V. tach was given LifeVest with plans for ICD: appt with Dr. Lovena Le next Thursday.  Acute on chronic renal failure Stage IV: crt 5.52 improved to 4.88 with fluids.  Tobacco abuse: continues to  smoke.  Hypertension: BP up and down.  Hyperlipidemia  Estella Husk PA-C  05/21/2014, 10:23 AM    Attending note:  Patient seen and examined. Reviewed extensive records and discussed the case with Ms. Bonnell Public PA-C. [name] is a patient of Dr. Bronson Ing, most recently seen in April. He has a nonischemic cardiomyopathy (LVEF 20-25%) with recently documented minimal coronary atherosclerosis at cardiac catheterization. Evaluation for prophylactic ICD is pending with Dr. Lovena Le next week, he has been wearing a Lifevest. Also known history of PVCs and NSVT, other chronic medical problems including progressive renal failure. He reports no change in his recent cardiac regimen, no palpitations or chest pain, no device shock or syncope. He has been on antibiotics as an outpatient per Dr. Dennard Schaumann recently, reporting nausea and emesis over the last 3 days and progressive weakness in this setting. On the day of admission he "slumped down" when he was in the bathroom getting dressed, observed by his wife, did not lose consciousness. On examination this morning he appears comfortable, ate breakfast without nausea or emesis. Lungs are clear and nonlabored, cardiac exam reveals RRR without gallop. He has had minimal elevation in troponin I with flat pattern most consistent with cardiomyopathy and not ACS particular in light of his recent cardiac catheterization findings. Rhythm is been stable on telemetry with PVCs noted. Agree that with recent nausea and emesis, he likely manifested relative dehydration resulting in his presenting symptoms. He has been gently hydrated since yesterday, taking in food today without difficulty. Agree with temporarily reducing Demadex to 50 mg twice daily. I discussed the situation with the patient and his wife. They voiced comfort with observation today to make sure that he does not continue to have nausea and emesis with meals. If this is the case, he can likely be discharged home  tomorrow on his standard outpatient diuretic regimen and keep follow-up in our clinic next week. For now we will DC IV fluids early afternoon, plan to check orthostatics in the morning. Do not anticipate further cardiac testing as an inpatient now.  Satira Sark, M.D., F.A.C.C.

## 2014-05-22 DIAGNOSIS — N170 Acute kidney failure with tubular necrosis: Secondary | ICD-10-CM | POA: Diagnosis not present

## 2014-05-22 DIAGNOSIS — I10 Essential (primary) hypertension: Secondary | ICD-10-CM | POA: Diagnosis not present

## 2014-05-22 DIAGNOSIS — N184 Chronic kidney disease, stage 4 (severe): Secondary | ICD-10-CM | POA: Diagnosis not present

## 2014-05-22 DIAGNOSIS — I5022 Chronic systolic (congestive) heart failure: Secondary | ICD-10-CM

## 2014-05-22 DIAGNOSIS — R531 Weakness: Secondary | ICD-10-CM | POA: Diagnosis not present

## 2014-05-22 DIAGNOSIS — I429 Cardiomyopathy, unspecified: Secondary | ICD-10-CM | POA: Diagnosis not present

## 2014-05-22 DIAGNOSIS — E11319 Type 2 diabetes mellitus with unspecified diabetic retinopathy without macular edema: Secondary | ICD-10-CM | POA: Diagnosis not present

## 2014-05-22 LAB — CBC
HEMATOCRIT: 36.6 % — AB (ref 39.0–52.0)
HEMOGLOBIN: 12.2 g/dL — AB (ref 13.0–17.0)
MCH: 29.8 pg (ref 26.0–34.0)
MCHC: 33.3 g/dL (ref 30.0–36.0)
MCV: 89.5 fL (ref 78.0–100.0)
Platelets: 198 10*3/uL (ref 150–400)
RBC: 4.09 MIL/uL — ABNORMAL LOW (ref 4.22–5.81)
RDW: 14.7 % (ref 11.5–15.5)
WBC: 5.8 10*3/uL (ref 4.0–10.5)

## 2014-05-22 LAB — BASIC METABOLIC PANEL
Anion gap: 10 (ref 5–15)
BUN: 112 mg/dL — AB (ref 6–20)
CHLORIDE: 103 mmol/L (ref 101–111)
CO2: 26 mmol/L (ref 22–32)
CREATININE: 5.32 mg/dL — AB (ref 0.61–1.24)
Calcium: 8.9 mg/dL (ref 8.9–10.3)
GFR calc Af Amer: 11 mL/min — ABNORMAL LOW (ref >60)
GFR calc non Af Amer: 10 mL/min — ABNORMAL LOW (ref >60)
Glucose, Bld: 111 mg/dL — ABNORMAL HIGH (ref 70–99)
Potassium: 3.9 mmol/L (ref 3.5–5.1)
SODIUM: 139 mmol/L (ref 135–145)

## 2014-05-22 LAB — GLUCOSE, CAPILLARY
GLUCOSE-CAPILLARY: 108 mg/dL — AB (ref 70–99)
GLUCOSE-CAPILLARY: 124 mg/dL — AB (ref 70–99)

## 2014-05-22 LAB — VITAMIN B12: VITAMIN B 12: 704 pg/mL (ref 180–914)

## 2014-05-22 NOTE — Progress Notes (Signed)
Pt discharged home today per Dr. Jerilee Hoh. Pt's IV site D/C'd and WDL. Pt's VSS.  Pt provided with home medication list and discharge instructions. Pt left floor via WC in stable condition accompanied by NT.

## 2014-05-22 NOTE — Progress Notes (Signed)
PT Cancellation Note  Patient Details Name: [name] MRN: [id number] DOB: [birth year]   Cancelled Treatment:    Reason Eval/Treat Not Completed: PT screened, no needs identified, will sign off   Sable Feil 05/22/2014, 8:54 AM

## 2014-05-22 NOTE — Progress Notes (Signed)
Subjective: Interval History: has no complaint of nausea or vomiting. Patient says that she is feeling much better. He denies also any difficulty in breathing..  Objective: Vital signs in last 24 hours: Temp:  [97.7 F (36.5 C)-98 F (36.7 C)] 97.8 F (36.6 C) (05/05 0652) Pulse Rate:  [60-68] 60 (05/05 0215) Resp:  [18] 18 (05/05 0652) BP: (128-157)/(37-42) 130/37 mmHg (05/05 0215) SpO2:  [98 %-100 %] 98 % (05/05 0655) Weight:  [82.146 kg (181 lb 1.6 oz)] 82.146 kg (181 lb 1.6 oz) (05/05 0701) Weight change: 0.181 kg (6.4 oz)  Intake/Output from previous day: 05/04 0701 - 05/05 0700 In: 1430.8 [P.O.:720; I.V.:710.8] Out: 275 [Urine:275] Intake/Output this shift:    General appearance: alert, cooperative and no distress Resp: clear to auscultation bilaterally Cardio: regular rate and rhythm, S1, S2 normal, no murmur, click, rub or gallop GI: soft, non-tender; bowel sounds normal; no masses,  no organomegaly Extremities: extremities normal, atraumatic, no cyanosis or edema  Lab Results:  Recent Labs  05/21/14 0355 05/22/14 0534  WBC 4.8 5.8  HGB 13.1 12.2*  HCT 39.6 36.6*  PLT 176 198   BMET:  Recent Labs  05/21/14 0355 05/22/14 0534  NA 138 139  K 3.7 3.9  CL 102 103  CO2 25 26  GLUCOSE 127* 111*  BUN 114* 112*  CREATININE 5.52* 5.32*  CALCIUM 8.5* 8.9   No results for input(s): PTH in the last 72 hours. Iron Studies: No results for input(s): IRON, TIBC, TRANSFERRIN, FERRITIN in the last 72 hours.  Studies/Results: Dg Chest 2 View  05/20/2014   CLINICAL DATA:  Cough and weakness for 1 week  EXAM: CHEST  2 VIEW  COMPARISON:  05/16/2014  FINDINGS: Cardiac shadow is within normal limits. The lungs are well aerated bilaterally. No focal infiltrate is seen. No effusion is noted. Degenerative changes of the thoracic spine are again seen.  IMPRESSION: No acute abnormality noted.   Electronically Signed   By: Inez Catalina M.D.   On: 05/20/2014 14:02   US  Renal  05/21/2014   CLINICAL DATA:  Atn  EXAM: RENAL / URINARY TRACT ULTRASOUND COMPLETE  COMPARISON:  05/06/2011  FINDINGS: Right Kidney:  Length: 12.4 cm. Diffuse increased echotexture throughout the right kidney. Multiple small cysts, the largest 2 cm in the midpole. No hydronephrosis.  Left Kidney:  Length: 12.6 cm. Diffusely increased echotexture. No hydronephrosis. Multiple small cysts, the largest measuring 2.2 cm in the upper pole. Complex hypoechoic area noted in the parapelvic midpole region of the left kidney measuring 1.9 x 1.8 x 1.4 cm, not a simple cyst.  Bladder:  Appears normal for degree of bladder distention.  IMPRESSION: Increased echotexture within the kidneys bilaterally compatible chronic medical renal disease.  Multiple bilateral renal cysts.  Complex hypoechoic area within the midpole of the left kidney. While this could reflect a complex benign cyst, I cannot completely exclude solid or cystic renal neoplasm. This could be further evaluated with renal protocol MRI.   Electronically Signed   By: Rolm Baptise M.D.   On: 05/21/2014 16:52    I have reviewed the patient's current medications.  Assessment/Plan: Problem #1 renal failure: At this moment seems to be acute on chronic. Presently his BUN and creatinine is slightly better. Patient denies any nausea or vomiting. His potassium is good. Problem #2 chronic renal failure stage IV/V. Etiology for his renal failure was thought to be secondary to diabetes/cardiorenal. Presently he doesn't have any uremic signs and symptoms. According to  the patient he was told to have a fistula placed for the last 5 years but he does want to do it. Presently he feels good and he is going to see his nephrologist next week. Problem #3 cardiorenal syndrome: Patient with history of nonischemic cardiomyopathy with low ejection fraction of 20-25 % Presently denies significant difficulty breathing. Patient does not have any significant sign of fluid  overload. Problem #4 history of diabetes Problem #5 weakness seems to be getting better Problem #6 hypokalemia: Potassium is corrected. Plan: Patient has this moment doesn't require dialysis but doesn't seem to be very far from requiring one. I have brought the issue of having a fistula at this moment patient doesn't seem to be very happy with it as she was told for the last 5 years and nothing happened. Patient doesn't believe his kidney function is getting that worse but he will be discussing with his nephrologist next week. Since his renal function is stable and my contribution  will be limited and I will off thank you  Problem #5 hypertension: His blood pressure is reasonably controlled     LOS: 2 days   Ginia Rudell S 05/22/2014,9:40 AM

## 2014-05-22 NOTE — Discharge Summary (Signed)
Physician Discharge Summary  [name] PTW:[id number] DOB: 02/14/1945 DOA: 05/20/2014  PCP: Odette Fraction, MD  Admit date: 05/20/2014 Discharge date: 05/22/2014  Time spent: 45 minutes  Recommendations for Outpatient Follow-up:  -Patient will be discharged home today. -Advised to follow-up with Dr. Harl Bowie and the cardiology team as scheduled next week.   Discharge Diagnoses:  Active Problems:   Diabetes mellitus type 2 with retinopathy   Essential hypertension   Chronic kidney disease, stage 4, severely decreased GFR   Chronic systolic congestive heart failure   Nonischemic cardiomyopathy   Weakness   Discharge Condition: Stable and improved  Filed Weights   05/20/14 1756 05/21/14 0700 05/22/14 0701  Weight: 81.965 kg (180 lb 11.2 oz) 80.8 kg (178 lb 2.1 oz) 82.146 kg (181 lb 1.6 oz)    History of present illness:  This is a 69 year old man, diabetic, hypertensive, history of nonischemic cardiomyopathy and stage IV chronic kidney disease, presents now with weakness. He has been feeling generally weak for the last 1 week. He has had nausea and some vomiting but not extensively so. This morning he was so weak that when he was standing in the bathroom, he slumped to the floor. He did not have loss of consciousness. The patient has had poor by mouth intake in the last few days. He denies any chest pain, dyspnea or fever. He has had a cough which is largely nonproductive. He was seen by his primary care physician about a week ago and was treated with antibiotics for a possible pneumonia. He was sent to this hospital on April 29 for chest x-ray which did not show any acute process. Evaluation in the emergency room shows slightly elevated troponin level. He continues to have weakness and now he is going to be admitted for further investigation and management.  Hospital Course:   Generalized weakness -Related to mild dehydration on top of severe systolic CHF. -Improved with IV  fluids. -TSH/B 12 within normal limits. -Seen by physical therapy without any needs identified.  Chronic systolic CHF due to nonischemic cardiomyopathy -Known ejection fraction of 20-25%.  -Is wearing a LifeVest and has an appointment with EP next week for implantation of defibrillator. -Has been evaluated by cardiology who agrees patient is stable for discharge home today.  Acute on chronic kidney disease stage IV -Based and creatinine appears to be aware down for, was 5.5 on admission and is down to 5.32 on discharge. -Has no acute indication for hemodialysis. -Has been followed by renal throughout his hospitalization.  Hypokalemia -Repleted  Type 2 diabetes -Well-controlled, continue current regimen.   Procedures:   none   Consultations:   cardiology, Dr. Domenic Polite  nephrology, Dr. Hinda Lenis  Discharge Instructions  Discharge Instructions    Diet - low sodium heart healthy    Complete by:  As directed      Increase activity slowly    Complete by:  As directed             Medication List    STOP taking these medications        azithromycin 250 MG tablet  Commonly known as:  ZITHROMAX     cefUROXime 500 MG tablet  Commonly known as:  CEFTIN     diphenhydramine-acetaminophen 25-500 MG Tabs  Commonly known as:  TYLENOL PM     ONE TOUCH ULTRA TEST test strip  Generic drug:  glucose blood      TAKE these medications  amLODipine 10 MG tablet  Commonly known as:  NORVASC  Take 1 tablet (10 mg total) by mouth daily.     aspirin EC 81 MG tablet  Take 81 mg by mouth daily.     carvedilol 12.5 MG tablet  Commonly known as:  COREG  Take 1 tablet (12.5 mg total) by mouth 2 (two) times daily with a meal.     cloNIDine 0.3 MG tablet  Commonly known as:  CATAPRES  Take 0.3 mg by mouth 2 (two) times daily.     febuxostat 40 MG tablet  Commonly known as:  ULORIC  Take 1 tablet (40 mg total) by mouth daily.     hydrALAZINE 50 MG tablet  Commonly  known as:  APRESOLINE  Take 1 tablet (50 mg total) by mouth 2 (two) times daily.     Insulin Glargine 100 UNIT/ML Solostar Pen  Commonly known as:  LANTUS SOLOSTAR  Inject 30 Units into the skin daily at 10 pm.     metolazone 2.5 MG tablet  Commonly known as:  ZAROXOLYN  2.5 mg Daily x 3 days and then 2.5 mg every Monday, Wednesday, and Friday.     potassium chloride SA 20 MEQ tablet  Commonly known as:  K-DUR,KLOR-CON  Take 1 tablet (20 mEq total) by mouth 3 (three) times daily.     torsemide 100 MG tablet  Commonly known as:  DEMADEX  Take 100 mg by mouth 2 (two) times daily.       Allergies  Allergen Reactions  . Ace Inhibitors Cough      The results of significant diagnostics from this hospitalization (including imaging, microbiology, ancillary and laboratory) are listed below for reference.    Significant Diagnostic Studies: Dg Chest 2 View  05/20/2014   CLINICAL DATA:  Cough and weakness for 1 week  EXAM: CHEST  2 VIEW  COMPARISON:  05/16/2014  FINDINGS: Cardiac shadow is within normal limits. The lungs are well aerated bilaterally. No focal infiltrate is seen. No effusion is noted. Degenerative changes of the thoracic spine are again seen.  IMPRESSION: No acute abnormality noted.   Electronically Signed   By: Inez Catalina M.D.   On: 05/20/2014 14:02   Dg Chest 2 View  05/16/2014   CLINICAL DATA:  Cough and congestion.  EXAM: CHEST  2 VIEW  COMPARISON:  01/22/2014.  FINDINGS: Mediastinum and hilar structures normal. Lungs are clear. Cardiomegaly with normal pulmonary vascularity. No pleural effusion or pneumothorax. No acute bony abnormality.  IMPRESSION: 1. Cardiomegaly.  No evidence of pulmonary venous congestion. 2. No acute pulmonary disease.   Electronically Signed   By: Marcello Moores  Register   On: 05/16/2014 11:49   US Renal  05/21/2014   CLINICAL DATA:  Atn  EXAM: RENAL / URINARY TRACT ULTRASOUND COMPLETE  COMPARISON:  05/06/2011  FINDINGS: Right Kidney:  Length: 12.4 cm.  Diffuse increased echotexture throughout the right kidney. Multiple small cysts, the largest 2 cm in the midpole. No hydronephrosis.  Left Kidney:  Length: 12.6 cm. Diffusely increased echotexture. No hydronephrosis. Multiple small cysts, the largest measuring 2.2 cm in the upper pole. Complex hypoechoic area noted in the parapelvic midpole region of the left kidney measuring 1.9 x 1.8 x 1.4 cm, not a simple cyst.  Bladder:  Appears normal for degree of bladder distention.  IMPRESSION: Increased echotexture within the kidneys bilaterally compatible chronic medical renal disease.  Multiple bilateral renal cysts.  Complex hypoechoic area within the midpole of the left kidney. While  this could reflect a complex benign cyst, I cannot completely exclude solid or cystic renal neoplasm. This could be further evaluated with renal protocol MRI.   Electronically Signed   By: Rolm Baptise M.D.   On: 05/21/2014 16:52    Microbiology: No results found for this or any previous visit (from the past 240 hour(s)).   Labs: Basic Metabolic Panel:  Recent Labs Lab 05/20/14 1158 05/21/14 0355 05/22/14 0534  NA 141 138 139  K 3.1* 3.7 3.9  CL 98* 102 103  CO2 28 25 26   GLUCOSE 141* 127* 111*  BUN 120* 114* 112*  CREATININE 4.88* 5.52* 5.32*  CALCIUM 9.0 8.5* 8.9   Liver Function Tests:  Recent Labs Lab 05/20/14 1158 05/21/14 0355  AST 18 15  ALT 12* 9*  ALKPHOS 65 56  BILITOT 0.9 0.6  PROT 6.6 5.9*  ALBUMIN 3.5 3.0*    Recent Labs Lab 05/20/14 1158  LIPASE 33   No results for input(s): AMMONIA in the last 168 hours. CBC:  Recent Labs Lab 05/20/14 1158 05/21/14 0355 05/22/14 0534  WBC 5.4 4.8 5.8  NEUTROABS 4.3  --   --   HGB 14.4 13.1 12.2*  HCT 42.9 39.6 36.6*  MCV 89.0 90.0 89.5  PLT 166 176 198   Cardiac Enzymes:  Recent Labs Lab 05/20/14 1158 05/20/14 1619 05/20/14 2147 05/21/14 0355  TROPONINI 0.10* 0.11* 0.12* 0.09*   BNP: BNP (last 3 results)  Recent Labs   01/22/14 2302  BNP >4500.0*    ProBNP (last 3 results)  Recent Labs  11/02/13 2047 12/01/13 0823  PROBNP 62727.0* 37333.0*    CBG:  Recent Labs Lab 05/21/14 1115 05/21/14 1701 05/21/14 2139 05/22/14 0804 05/22/14 1152  GLUCAP 136* 145* 108* 108* 124*       Signed:  HERNANDEZ ACOSTA,ESTELA  [facility] Pager: [phone] 05/22/2014, 4:27 PM

## 2014-05-22 NOTE — Progress Notes (Signed)
Primary cardiologist: Dr. Kate Sable  Seen for followup: Weakness and probable dehydration, cardiomyopathy  Subjective:    Feels better today, no further nausea or emesis. Chest pain or palpitations.  Objective:   Temp:  [97.7 F (36.5 C)-98 F (36.7 C)] 97.8 F (36.6 C) (05/05 0652) Pulse Rate:  [60-68] 60 (05/05 0215) Resp:  [18] 18 (05/05 0652) BP: (128-157)/(37-42) 130/37 mmHg (05/05 0215) SpO2:  [98 %-100 %] 98 % (05/05 0655) Weight:  [181 lb 1.6 oz (82.146 kg)] 181 lb 1.6 oz (82.146 kg) (05/05 0701) Last BM Date:  (4/19)  Filed Weights   05/20/14 1756 05/21/14 0700 05/22/14 0701  Weight: 180 lb 11.2 oz (81.965 kg) 178 lb 2.1 oz (80.8 kg) 181 lb 1.6 oz (82.146 kg)    Intake/Output Summary (Last 24 hours) at 05/22/14 1013 Last data filed at 05/22/14 0900  Gross per 24 hour  Intake 1430.83 ml  Output    275 ml  Net 1155.83 ml    Telemetry: Sinus rhythm.  Exam:  General: Appears comfortable.  Lungs: Clear, nonlabored breathing.  Cardiac: RRR without gallop.  Extremities: No pitting edema.  Lab Results:  Basic Metabolic Panel:  Recent Labs Lab 05/20/14 1158 05/21/14 0355 05/22/14 0534  NA 141 138 139  K 3.1* 3.7 3.9  CL 98* 102 103  CO2 28 25 26   GLUCOSE 141* 127* 111*  BUN 120* 114* 112*  CREATININE 4.88* 5.52* 5.32*  CALCIUM 9.0 8.5* 8.9    Liver Function Tests:  Recent Labs Lab 05/20/14 1158 05/21/14 0355  AST 18 15  ALT 12* 9*  ALKPHOS 65 56  BILITOT 0.9 0.6  PROT 6.6 5.9*  ALBUMIN 3.5 3.0*    CBC:  Recent Labs Lab 05/20/14 1158 05/21/14 0355 05/22/14 0534  WBC 5.4 4.8 5.8  HGB 14.4 13.1 12.2*  HCT 42.9 39.6 36.6*  MCV 89.0 90.0 89.5  PLT 166 176 198    Cardiac Enzymes:  Recent Labs Lab 05/20/14 1619 05/20/14 2147 05/21/14 0355  TROPONINI 0.11* 0.12* 0.09*    BNP:  Recent Labs  11/02/13 2047 12/01/13 0823  PROBNP 62727.0* 37333.0*     Medications:   Scheduled Medications: . amLODipine   10 mg Oral Daily  . aspirin EC  81 mg Oral Daily  . carvedilol  12.5 mg Oral BID WC  . cloNIDine  0.3 mg Oral BID  . febuxostat  40 mg Oral Daily  . heparin  5,000 Units Subcutaneous 3 times per day  . hydrALAZINE  50 mg Oral BID  . insulin aspart  0-5 Units Subcutaneous QHS  . insulin aspart  0-9 Units Subcutaneous TID WC  . insulin glargine  30 Units Subcutaneous Q2200  . potassium chloride SA  20 mEq Oral TID  . sodium chloride  3 mL Intravenous Q12H  . torsemide  50 mg Oral BID     PRN Medications:  ondansetron **OR** ondansetron (ZOFRAN) IV   Assessment:   1. Presentation with weakness and relative dehydration after episodic nausea and emesis, improved with hydration. He was not orthostatic on follow-up, has been eating without further nausea or emesis. Diuretics temporarily reduced during hospital stay.  2. Nonischemic cardiomyopathy with LVEF 20-25% on medical therapy. Patient has had LifeVest in place pending EP evaluation with Dr. Lovena Le next week.  3. Acute on chronic renal insufficiency with baseline CKD stage 4-5.   Plan/Discussion:    Discussed with patient and wife, would plan on discharge home today. Resume previous outpatient cardiac  regimen including diuretic doses. Keep cardiology follow-up already scheduled for next week.   Satira Sark, M.D., F.A.C.C.

## 2014-05-23 ENCOUNTER — Ambulatory Visit (INDEPENDENT_AMBULATORY_CARE_PROVIDER_SITE_OTHER): Payer: Medicare Other | Admitting: Family Medicine

## 2014-05-23 ENCOUNTER — Encounter: Payer: Self-pay | Admitting: Family Medicine

## 2014-05-23 ENCOUNTER — Other Ambulatory Visit: Payer: Self-pay | Admitting: Family Medicine

## 2014-05-23 VITALS — BP 140/56 | HR 78 | Temp 97.5°F | Resp 16 | Ht 71.0 in | Wt 187.0 lb

## 2014-05-23 DIAGNOSIS — Z09 Encounter for follow-up examination after completed treatment for conditions other than malignant neoplasm: Secondary | ICD-10-CM

## 2014-05-23 DIAGNOSIS — I5020 Unspecified systolic (congestive) heart failure: Secondary | ICD-10-CM | POA: Diagnosis not present

## 2014-05-23 DIAGNOSIS — N184 Chronic kidney disease, stage 4 (severe): Secondary | ICD-10-CM

## 2014-05-23 NOTE — Progress Notes (Signed)
Subjective:    Patient ID: [name], male    DOB: 03-07-1945, 69 y.o.   MRN: [id number]  HPI Patient was recently admitted to the hospital. I have copied relevant portions of his discharge summary and included them below for my reference: Admit date: 05/20/2014 Discharge date: 05/22/2014  Time spent: 45 minutes  Recommendations for Outpatient Follow-up:  -Patient will be discharged home today. -Advised to follow-up with Dr. Harl Bowie and the cardiology team as scheduled next week.  Discharge Diagnoses:  Active Problems:  Diabetes mellitus type 2 with retinopathy  Essential hypertension  Chronic kidney disease, stage 4, severely decreased GFR  Chronic systolic congestive heart failure  Nonischemic cardiomyopathy  Weakness   Discharge Condition: Stable and improved  Filed Weights   05/20/14 1756 05/21/14 0700 05/22/14 0701  Weight: 81.965 kg (180 lb 11.2 oz) 80.8 kg (178 lb 2.1 oz) 82.146 kg (181 lb 1.6 oz)    History of present illness:  This is a 69 year old man, diabetic, hypertensive, history of nonischemic cardiomyopathy and stage IV chronic kidney disease, presents now with weakness. He has been feeling generally weak for the last 1 week. He has had nausea and some vomiting but not extensively so. This morning he was so weak that when he was standing in the bathroom, he slumped to the floor. He did not have loss of consciousness. The patient has had poor by mouth intake in the last few days. He denies any chest pain, dyspnea or fever. He has had a cough which is largely nonproductive. He was seen by his primary care physician about a week ago and was treated with antibiotics for a possible pneumonia. He was sent to this hospital on April 29 for chest x-ray which did not show any acute process. Evaluation in the emergency room shows slightly elevated troponin level. He continues to have weakness and now he is going to be admitted for further investigation and  management.  Hospital Course:   Generalized weakness -Related to mild dehydration on top of severe systolic CHF. -Improved with IV fluids. -TSH/B 12 within normal limits. -Seen by physical therapy without any needs identified.  Chronic systolic CHF due to nonischemic cardiomyopathy -Known ejection fraction of 20-25%.  -Is wearing a LifeVest and has an appointment with EP next week for implantation of defibrillator. -Has been evaluated by cardiology who agrees patient is stable for discharge home today.  Acute on chronic kidney disease stage IV -Based and creatinine appears to be aware down for, was 5.5 on admission and is down to 5.32 on discharge. -Has no acute indication for hemodialysis. -Has been followed by renal throughout his hospitalization.  Hypokalemia -Repleted  Type 2 diabetes -Well-controlled, continue current regimen.    Medication List    STOP taking these medications       azithromycin 250 MG tablet  Commonly known as: ZITHROMAX     cefUROXime 500 MG tablet  Commonly known as: CEFTIN     diphenhydramine-acetaminophen 25-500 MG Tabs  Commonly known as: TYLENOL PM     ONE TOUCH ULTRA TEST test strip  Generic drug: glucose blood      TAKE these medications       amLODipine 10 MG tablet  Commonly known as: NORVASC  Take 1 tablet (10 mg total) by mouth daily.     aspirin EC 81 MG tablet  Take 81 mg by mouth daily.     carvedilol 12.5 MG tablet  Commonly known as: COREG  Take 1 tablet (  12.5 mg total) by mouth 2 (two) times daily with a meal.     cloNIDine 0.3 MG tablet  Commonly known as: CATAPRES  Take 0.3 mg by mouth 2 (two) times daily.     febuxostat 40 MG tablet  Commonly known as: ULORIC  Take 1 tablet (40 mg total) by mouth daily.     hydrALAZINE 50 MG tablet  Commonly known as: APRESOLINE  Take 1 tablet (50 mg total) by mouth 2 (two) times daily.     Insulin  Glargine 100 UNIT/ML Solostar Pen  Commonly known as: LANTUS SOLOSTAR  Inject 30 Units into the skin daily at 10 pm.     metolazone 2.5 MG tablet  Commonly known as: ZAROXOLYN  2.5 mg Daily x 3 days and then 2.5 mg every Monday, Wednesday, and Friday.     potassium chloride SA 20 MEQ tablet  Commonly known as: K-DUR,KLOR-CON  Take 1 tablet (20 mEq total) by mouth 3 (three) times daily.     torsemide 100 MG tablet  Commonly known as: DEMADEX  Take 100 mg by mouth 2 (two) times daily           05/23/14 Patient is here today for follow-up. Patient looks extremely weak.  Patient has lost 10 pounds since March. He looks dehydrated. In the hospital, the patient perked up when he was given IV fluids. There was no evidence of acidosis and his potassium was stable and therefore there was no indication for emergent dialysis. However the patient now reports nausea. He reports inability to tolerate by mouth. He reports profound weakness. He is here today for hospital follow-up. Past Medical History  Diagnosis Date  . Chronic combined systolic and diastolic CHF (congestive heart failure)     a. 02/2014 Echo: EF 20-25% (new), Gr 2 DD, mod conc LVH, mild AI/AS, mod MR, sev dil LA, mild TR.  Marland Kitchen Nonischemic cardiomyopathy     a. 02/2014 Echo: EF 20-25%;  b. 03/2014 Cath: LM nl, LAD min irregs, LCX   . 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 Hypertension   . Chronic kidney disease (CKD), stage IV (severe) 12/2009    a. baseline creat now ~ 4.  . Erectile dysfunction   . Colonic polyp   . Low serum testosterone level   . Tobacco abuse   . Sleep apnea 2010    Initially unable to afford CPAP  . Benign prostatic hypertrophy   . Obesity, Class I, BMI 30-34.9   . Hyperlipidemia   . Congestive heart failure (CHF)     EF 25-30% 07/2011  . Adenomatous colon polyp 2006  . H. pylori infection 2013    treated with prevpac  . Mild Ao Stenosis and Insufficiency     Past Surgical History  Procedure Laterality Date  . Retinal laser procedure      diabetic retinopathy  . Lymph node biopsy      surgical exploration of neck-not entirely clear that this represented a lymph node biopsy  . Colonoscopy  08/23/2004    Dr. Vivi Ferns rectum, diminutive polyp of the rectosigmoid removed, inflamed focally adenomatous polyp  . Left and right heart catheterization with coronary angiogram N/A 03/19/2014    Procedure: LEFT AND RIGHT HEART CATHETERIZATION WITH CORONARY ANGIOGRAM;  Surgeon: Blane Ohara, MD;  Location: [facility] CATH LAB;  Service: Cardiovascular;  Laterality: N/A;   Current Outpatient Prescriptions on File Prior to Visit  Medication Sig Dispense Refill  .  amLODipine (NORVASC) 10 MG tablet Take 1 tablet (10 mg total) by mouth daily. 30 tablet 6  . aspirin EC 81 MG tablet Take 81 mg by mouth daily.    . carvedilol (COREG) 12.5 MG tablet Take 1 tablet (12.5 mg total) by mouth 2 (two) times daily with a meal. 180 tablet 3  . cloNIDine (CATAPRES) 0.3 MG tablet Take 0.3 mg by mouth 2 (two) times daily.     . febuxostat (ULORIC) 40 MG tablet Take 1 tablet (40 mg total) by mouth daily. 30 tablet 2  . hydrALAZINE (APRESOLINE) 50 MG tablet Take 1 tablet (50 mg total) by mouth 2 (two) times daily. 180 tablet 3  . Insulin Glargine (LANTUS SOLOSTAR) 100 UNIT/ML Solostar Pen Inject 30 Units into the skin daily at 10 pm. 5 pen 3  . metolazone (ZAROXOLYN) 2.5 MG tablet 2.5 mg Daily x 3 days and then 2.5 mg every Monday, Wednesday, and Friday. 30 tablet 3  . potassium chloride SA (K-DUR,KLOR-CON) 20 MEQ tablet Take 1 tablet (20 mEq total) by mouth 3 (three) times daily. 90 tablet 3  . torsemide (DEMADEX) 100 MG tablet Take 100 mg by mouth 2 (two) times daily.      No current facility-administered medications on file prior to visit.   Allergies  Allergen Reactions  . Ace Inhibitors Cough   History   Social History  . Marital Status: Married    Spouse Name: N/A    . Number of Children: 4  . Years of Education: N/A   Occupational History  . Semi - Retired Administrator    Social History Main Topics  . Smoking status: Current Every Day Smoker -- 0.25 packs/day for 45 years    Types: Cigarettes    Start date: 05/31/1958  . Smokeless tobacco: Current User     Comment: 1-2 cigarettes OF THE NEW ELECTRIC CIGARETTES  . Alcohol Use: 2.4 oz/week    2 Glasses of wine, 2 Shots of liquor per week     Comment: occ  . Drug Use: No  . Sexual Activity: Not on file   Other Topics Concern  . Not on file   Social History Narrative    Review of Systems  All other systems reviewed and are negative.      Objective:   Physical Exam  Constitutional: He appears well-developed. He has a sickly appearance.  HENT:  Nose: Nose normal.  Mouth/Throat: Oropharynx is clear and moist.  Eyes: Conjunctivae are normal.  Neck: Neck supple.  Cardiovascular: Normal rate, regular rhythm and normal heart sounds.   Pulmonary/Chest: Effort normal and breath sounds normal. No respiratory distress. He has no wheezes. He has no rales.  Abdominal: Soft. Bowel sounds are normal.  Musculoskeletal: He exhibits no edema.  Lymphadenopathy:    He has no cervical adenopathy.  Vitals reviewed.         Assessment & Plan:  Chronic kidney disease (CKD), stage IV (severe) - Plan: BASIC METABOLIC PANEL WITH GFR  Hospital discharge follow-up  CHF (congestive heart failure), NYHA class IV, unspecified failure chronicity, systolic  I spent 30 minutes with the patient discussing his medical comorbidities. I believe this is a perfect storm of his ongoing medical problems. I believe his kidney failure is worsening. Although he has no indications for emergent dialysis, I believe the symptoms are a sign that the patient is becoming uremic and would benefit from hemodialysis. Coupled with severe stage IV heart failure, I believe this is a completely logical  explanation for his profound  fatigue, nausea, early satiety, and weakness. I see very little that I can offer in the way to help this patient. However I do think the patient would benefit from a decreased dose of his diuretic. He is clinically dehydrated today. I believe this is exacerbating his weakness and likely contributing to his fatigue. Therefore I recommended decreasing the dose of his torsemide to 50 mg twice a day and that he discontinue metolazone. He is scheduled to follow-up with his nephrologist the first of next week. I recommended that he strongly consider beginning hemodialysis.

## 2014-05-24 LAB — BASIC METABOLIC PANEL WITH GFR
BUN: 98 mg/dL — ABNORMAL HIGH (ref 6–23)
CALCIUM: 9.1 mg/dL (ref 8.4–10.5)
CO2: 24 meq/L (ref 19–32)
Chloride: 101 mEq/L (ref 96–112)
Creat: 4.48 mg/dL — ABNORMAL HIGH (ref 0.50–1.35)
GFR, EST NON AFRICAN AMERICAN: 12 mL/min — AB
GFR, Est African American: 14 mL/min — ABNORMAL LOW
Glucose, Bld: 215 mg/dL — ABNORMAL HIGH (ref 70–99)
Potassium: 3.8 mEq/L (ref 3.5–5.3)
Sodium: 135 mEq/L (ref 135–145)

## 2014-05-27 ENCOUNTER — Encounter (HOSPITAL_COMMUNITY)
Admission: RE | Admit: 2014-05-27 | Discharge: 2014-05-27 | Disposition: A | Discharge status: Home / Self Care | Payer: Medicare Other | Source: Ambulatory Visit | Attending: Nephrology | Admitting: Nephrology

## 2014-05-27 DIAGNOSIS — N184 Chronic kidney disease, stage 4 (severe): Secondary | ICD-10-CM | POA: Diagnosis not present

## 2014-05-27 DIAGNOSIS — D631 Anemia in chronic kidney disease: Secondary | ICD-10-CM | POA: Diagnosis not present

## 2014-05-27 LAB — FERRITIN: Ferritin: 652 ng/mL — ABNORMAL HIGH (ref 24–336)

## 2014-05-27 LAB — HEMOGLOBIN AND HEMATOCRIT, BLOOD
HCT: 39.7 % (ref 39.0–52.0)
HEMOGLOBIN: 13.4 g/dL (ref 13.0–17.0)

## 2014-05-27 LAB — IRON AND TIBC
IRON: 56 ug/dL (ref 45–182)
Saturation Ratios: 25 % (ref 17.9–39.5)
TIBC: 224 ug/dL — ABNORMAL LOW (ref 250–450)
UIBC: 168 ug/dL

## 2014-05-27 NOTE — Progress Notes (Signed)
Results for [name], [name] (MRN [id number]) as of 05/27/2014 15:05  Ref. Range 05/27/2014 08:00  Iron Latest Ref Range: 45-182 ug/dL 56  UIBC Latest Units: ug/dL 168  TIBC Latest Ref Range: 250-450 ug/dL 224 (L)  Saturation Ratios Latest Ref Range: 17.9-39.5 % 25  Ferritin Latest Ref Range: 24-336 ng/mL 652 (H)  Hemoglobin Latest Ref Range: 13.0-17.0 g/dL 13.4  HCT Latest Ref Range: 39.0-52.0 % 39.7

## 2014-05-28 DIAGNOSIS — M545 Low back pain: Secondary | ICD-10-CM | POA: Diagnosis not present

## 2014-05-30 ENCOUNTER — Institutional Professional Consult (permissible substitution): Payer: Medicare Other | Admitting: Internal Medicine

## 2014-06-03 ENCOUNTER — Encounter: Payer: Self-pay | Admitting: Internal Medicine

## 2014-06-03 ENCOUNTER — Ambulatory Visit (INDEPENDENT_AMBULATORY_CARE_PROVIDER_SITE_OTHER): Payer: Medicare Other | Admitting: Internal Medicine

## 2014-06-03 VITALS — BP 185/75 | HR 82 | Ht 71.0 in | Wt 189.4 lb

## 2014-06-03 DIAGNOSIS — R609 Edema, unspecified: Secondary | ICD-10-CM

## 2014-06-03 DIAGNOSIS — I340 Nonrheumatic mitral (valve) insufficiency: Secondary | ICD-10-CM

## 2014-06-03 DIAGNOSIS — I5022 Chronic systolic (congestive) heart failure: Secondary | ICD-10-CM | POA: Diagnosis not present

## 2014-06-03 DIAGNOSIS — R55 Syncope and collapse: Secondary | ICD-10-CM

## 2014-06-03 NOTE — Patient Instructions (Signed)
Medication Instructions:  Your physician recommends that you continue on your current medications as directed. Please refer to the Current Medication list given to you today.   Labwork: None ordered  Testing/Procedures: None ordered  Follow-Up: Your physician recommends that you schedule a follow-up appointment as needed with Dr Lovena Le   Any Other Special Instructions Will Be Listed Below (If Applicable).  Call if you decide to proceed with ICD implant---Kelly Madilyn Fireman, RN [phone] Send Lifevest back  Cardioverter Defibrillator Implantation An implantable cardioverter defibrillator (ICD) is a small, lightweight, battery-powered device that is placed (implanted) under the skin in the chest or abdomen. Your caregiver may prescribe an ICD if:  You have had an irregular heart rhythm (arrhythmia) that originated in the lower chambers of the heart (ventricles).  Your heart has been damaged by a disease (such as coronary artery disease) or heart condition (such as a heart attack). An ICD consists of a battery that lasts several years, a small computer called a pulse generator, and wires called leads that go into the heart. It is used to detect and correct two dangerous arrhythmias: a rapid heart rhythm (tachycardia) and an arrhythmia in which the ventricles contract in an uncoordinated way (fibrillation). When an ICD detects tachycardia, it sends an electrical signal to the heart that restores the heartbeat to normal (cardioversion). This signal is usually painless. If cardioversion does not work or if the ICD detects fibrillation, it delivers a small electrical shock to the heart (defibrillation) to restart the heart. The shock may feel like a strong jolt in the chest.ICDs may be programmed to correct other problems. Sometimes, ICDs are programmed to act as another type of implantable device called a pacemaker. Pacemakers are used to treat a slow heartbeat (bradycardia). LET YOUR CAREGIVER KNOW  ABOUT:  Any allergies you have.  All medicines you are taking, including vitamins, herbs, eyedrops, and over-the-counter medicines and creams.  Previous problems you or members of your family have had with the use of anesthetics.  Any blood disorders you have had.  Other health problems you have. RISKS AND COMPLICATIONS Generally, the procedure to implant an ICD is safe. However, as with any surgical procedure, complications can occur. Possible complications associated with implanting an ICD include:  Swelling, bleeding, or bruising at the site where the ICD was implanted.  Infection at the site where the ICD was implanted.  A reaction to medicine used during the procedure.  Nerve, heart, or blood vessel damage.  Blood clots. BEFORE THE PROCEDURE  You may need to have blood tests, heart tests, or a chest X-ray done before the day of the procedure.  Ask your caregiver about changing or stopping your regular medicines.  Make plans to have someone drive you home. You may need to stay in the hospital overnight after the procedure.  Stop smoking at least 24 hours before the procedure.  Take a bath or shower the night before the procedure. You may need to scrub your chest or abdomen with a special type of soap.  Do not eat or drink before your procedure for as long as directed by your caregiver. Ask if it is okay to take any needed medicine with a small sip of water. PROCEDURE  The procedure to implant an ICD in your chest or abdomen is usually done at a hospital in a room that has a large X-ray machine called a fluoroscope. The machine will be above you during the procedure. It will help your caregiver see your heart during  the procedure. Implanting an ICD usually takes 1-3 hours. Before the procedure:   Small monitors will be put on your body. They will be used to check your heart, blood pressure, and oxygen level.  A needle will be put into a vein in your hand or arm. This is  called an intravenous (IV) access tube. Fluids and medicine will flow directly into your body through the IV tube.  Your chest or abdomen will be cleaned with a germ-killing (antiseptic) solution. The area may be shaved.  You may be given medicine to help you relax (sedative).  You will be given a medicine called a local anesthetic. This medicine will make the surgical site numb while the ICD is implanted. You will be sleepy but awake during the procedure. After you are numb the procedure will begin. The caregiver will:  Make a small cut (incision). This will make a pocket deep under your skin that will hold the pulse generator.  Guide the leads through a large blood vessel into your heart and attach them to the heart muscles. Depending on the ICD, the leads may go into one ventricle or they may go to both ventricles and into an upper chamber of the heart (atrium).  Test the ICD.  Close the incision with stitches, glue, or staples. AFTER THE PROCEDURE  You may feel pain. Some pain is normal. It may last a few days.  You may stay in a recovery area until the local anesthetic has worn off. Your blood pressure and pulse will be checked often. You will be taken to a room where your heart will be monitored.  A chest X-ray will be taken. This is done to check that the cardioverter defibrillator is in the right place.  You may stay in the hospital overnight.  A slight bump may be seen over the skin where the ICD was placed. Sometimes, it is possible to feel the ICD under the skin. This is normal.  In the months and years afterward, your caregiver will check the device, the leads, and the battery every few months. Eventually, when the battery is low, the ICD will be replaced. Document Released: 09/25/2001 Document Revised: 10/24/2012 Document Reviewed: 01/23/2012 [facility] Patient Information 2015 [place], [place]. This information is not intended to replace advice given to you by your health  care provider. Make sure you discuss any questions you have with your health care provider.

## 2014-06-03 NOTE — Assessment & Plan Note (Signed)
I have reviewed this episode with his wife. He had diarrhea and became weak but never passed out. He has not had true syncope according to the patient and his wife.

## 2014-06-03 NOTE — Assessment & Plan Note (Signed)
This appears to have resolved and he is euvolemic. Will follow.

## 2014-06-03 NOTE — Assessment & Plan Note (Signed)
His symptoms are currently well compensated. He has an indication for ICD implant and we discussed the pro's and con's of both. He does not want to wear his Life Vest any longer and from my perspective I told him he could remove. He has had no symptoms and his Life vest has demonstrated no arrhythmias and he is non-ischemic. He will call us if he would like to proceed with ICD. I think if he does, a SQ device would make the most sense because of his many comorbidities.

## 2014-06-03 NOTE — Progress Notes (Signed)
HPI [name] is referred today by Dr. Jacinta Shoe for evaluation and consideration for insertion of an ICD. He has a longstanding history of severe LV dysfunction dating back to 2010. He had normalization of his EF by echo in 2014. He did well until representing in February/March. He had worsening dyspnea. He currently has class 2 symptoms. A nuclear stress test in mid February demonstrated an EF of 20% with scar and catheterization in March demonstrated no CAD but severe LV dysfunction. He is on maximal medical therapy but cannot take an ACE/ARB due to renal insufficiency. He has stage 5 renal insufficiency and his most recent serum creatinine was 4.5. He has done well from a fluid volume perspective. His QRS duration is about 120 ms. Allergies  Allergen Reactions  . Ace Inhibitors Cough     Current Outpatient Prescriptions  Medication Sig Dispense Refill  . amLODipine (NORVASC) 10 MG tablet Take 1 tablet (10 mg total) by mouth daily. 30 tablet 6  . aspirin EC 81 MG tablet Take 81 mg by mouth daily.    . carvedilol (COREG) 12.5 MG tablet Take 1 tablet (12.5 mg total) by mouth 2 (two) times daily with a meal. 180 tablet 3  . febuxostat (ULORIC) 40 MG tablet Take 1 tablet (40 mg total) by mouth daily. 30 tablet 2  . hydrALAZINE (APRESOLINE) 50 MG tablet Take 1 tablet (50 mg total) by mouth 2 (two) times daily. 180 tablet 3  . Insulin Glargine (LANTUS SOLOSTAR) 100 UNIT/ML Solostar Pen Inject 30 Units into the skin daily at 10 pm. 5 pen 3  . metolazone (ZAROXOLYN) 2.5 MG tablet 2.5 mg Daily x 3 days and then 2.5 mg every Monday, Wednesday, and Friday. 30 tablet 3  . potassium chloride SA (K-DUR,KLOR-CON) 20 MEQ tablet Take 1 tablet (20 mEq total) by mouth 3 (three) times daily. 90 tablet 3  . torsemide (DEMADEX) 100 MG tablet Take 100 mg by mouth 2 (two) times daily.     . cloNIDine (CATAPRES) 0.3 MG tablet Take 0.3 mg by mouth 2 (two) times daily.      No current facility-administered  medications for this visit.     Past Medical History  Diagnosis Date  . Chronic combined systolic and diastolic CHF (congestive heart failure)     a. 02/2014 Echo: EF 20-25% (new), Gr 2 DD, mod conc LVH, mild AI/AS, mod MR, sev dil LA, mild TR.  Marland Kitchen Nonischemic cardiomyopathy     a. 02/2014 Echo: EF 20-25%;  b. 03/2014 Cath: LM nl, LAD min irregs, LCX   . 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 Hypertension   . Chronic kidney disease (CKD), stage IV (severe) 12/2009    a. baseline creat now ~ 4.  . Erectile dysfunction   . Colonic polyp   . Low serum testosterone level   . Tobacco abuse   . Sleep apnea 2010    Initially unable to afford CPAP  . Benign prostatic hypertrophy   . Obesity, Class I, BMI 30-34.9   . Hyperlipidemia   . Congestive heart failure (CHF)     EF 25-30% 07/2011  . Adenomatous colon polyp 2006  . H. pylori infection 2013    treated with prevpac  . Mild Ao Stenosis and Insufficiency     ROS:   All systems reviewed and negative except as noted in the HPI.   Past Surgical History  Procedure Laterality Date  .  Retinal laser procedure      diabetic retinopathy  . Lymph node biopsy      surgical exploration of neck-not entirely clear that this represented a lymph node biopsy  . Colonoscopy  08/23/2004    Dr. Vivi Ferns rectum, diminutive polyp of the rectosigmoid removed, inflamed focally adenomatous polyp  . Left and right heart catheterization with coronary angiogram N/A 03/19/2014    Procedure: LEFT AND RIGHT HEART CATHETERIZATION WITH CORONARY ANGIOGRAM;  Surgeon: Blane Ohara, MD;  Location: [facility] CATH LAB;  Service: Cardiovascular;  Laterality: N/A;     Family History  Problem Relation Age of Onset  . Hypertension Mother   . Cancer Mother   . Cancer Father   . Diabetes Sister      History   Social History  . Marital Status: Married    Spouse Name: N/A  . Number of Children: 4  . Years of Education: N/A    Occupational History  . Semi - Retired Administrator    Social History Main Topics  . Smoking status: Current Every Day Smoker -- 0.25 packs/day for 45 years    Types: Cigarettes    Start date: 05/31/1958  . Smokeless tobacco: Current User     Comment: 1-2 cigarettes OF THE NEW ELECTRIC CIGARETTES  . Alcohol Use: 2.4 oz/week    2 Glasses of wine, 2 Shots of liquor per week     Comment: occ  . Drug Use: No  . Sexual Activity: Not on file   Other Topics Concern  . Not on file   Social History Narrative     BP 185/75 mmHg  Pulse 82  Ht 5\' 11"  (1.803 m)  Wt 189 lb 6.4 oz (85.911 kg)  BMI 26.43 kg/m2  Physical Exam:  Well appearing 69 yo man, NAD HEENT: Unremarkable Neck:  7 cm JVD, no thyromegally Back:  No CVA tenderness Lungs:  Clear with no wheezes HEART:  Regular rate rhythm, no murmurs, no rubs, no clicks Abd:  soft, positive bowel sounds, no organomegally, no rebound, no guarding Ext:  2 plus pulses, no edema, no cyanosis, no clubbing Skin:  No rashes no nodules Neuro:  CN II through XII intact, motor grossly intact  EKG - reviewed NSR with IVCD and PVC's  Assess/Plan:

## 2014-06-03 NOTE — Assessment & Plan Note (Signed)
He has both MR and AI on exam. Will follow.

## 2014-06-05 ENCOUNTER — Telehealth: Payer: Self-pay | Admitting: Family Medicine

## 2014-06-05 DIAGNOSIS — I120 Hypertensive chronic kidney disease with stage 5 chronic kidney disease or end stage renal disease: Secondary | ICD-10-CM | POA: Diagnosis not present

## 2014-06-05 DIAGNOSIS — E1129 Type 2 diabetes mellitus with other diabetic kidney complication: Secondary | ICD-10-CM | POA: Diagnosis not present

## 2014-06-05 DIAGNOSIS — N185 Chronic kidney disease, stage 5: Secondary | ICD-10-CM | POA: Diagnosis not present

## 2014-06-05 DIAGNOSIS — D631 Anemia in chronic kidney disease: Secondary | ICD-10-CM | POA: Diagnosis not present

## 2014-06-05 NOTE — Telephone Encounter (Signed)
[phone]  PT has called and wanting samples of the Lantus (christina and I looked and we don't have any) he said that he is about out and I offered to put a message in to see about getting some sent to pharmacy he states that he could go there but it is very expensive like 300 dollars. I told him that I would send a message back and see if there is anything that we could do.

## 2014-06-06 NOTE — Telephone Encounter (Signed)
LMTRC

## 2014-06-06 NOTE — Telephone Encounter (Signed)
Pt aware and will stop by to pu

## 2014-06-06 NOTE — Telephone Encounter (Signed)
Per Dr. Dennard Schaumann ok to give toujeo samples.

## 2014-06-10 ENCOUNTER — Encounter (HOSPITAL_COMMUNITY)
Admission: RE | Admit: 2014-06-10 | Discharge: 2014-06-10 | Disposition: A | Discharge status: Home / Self Care | Payer: Medicare Other | Source: Ambulatory Visit | Attending: Nephrology | Admitting: Nephrology

## 2014-06-10 DIAGNOSIS — N184 Chronic kidney disease, stage 4 (severe): Secondary | ICD-10-CM | POA: Diagnosis not present

## 2014-06-10 DIAGNOSIS — D631 Anemia in chronic kidney disease: Secondary | ICD-10-CM | POA: Diagnosis not present

## 2014-06-10 LAB — HEMOGLOBIN AND HEMATOCRIT, BLOOD
HCT: 32.1 % — ABNORMAL LOW (ref 39.0–52.0)
Hemoglobin: 10.8 g/dL — ABNORMAL LOW (ref 13.0–17.0)

## 2014-06-10 MED ORDER — DARBEPOETIN ALFA 100 MCG/0.5ML IJ SOSY
100.0000 ug | PREFILLED_SYRINGE | INTRAMUSCULAR | Status: DC
  Administered 2014-06-10: 100 ug via SUBCUTANEOUS

## 2014-06-10 MED ORDER — DARBEPOETIN ALFA 100 MCG/0.5ML IJ SOSY
PREFILLED_SYRINGE | INTRAMUSCULAR | Status: AC
  Filled 2014-06-10: qty 0.5

## 2014-06-11 LAB — PTH, INTACT AND CALCIUM
Calcium, Total (PTH): 8.9 mg/dL (ref 8.6–10.2)
PTH: 111 pg/mL — ABNORMAL HIGH (ref 15–65)

## 2014-06-11 NOTE — Progress Notes (Signed)
Results for MOHAMED, PORTLOCK (MRN [id number]) as of 06/11/2014 11:25  Ref. Range 06/10/2014 08:00  Calcium, Total (PTH) Latest Ref Range: 8.6-10.2 mg/dL 8.9  Hemoglobin Latest Ref Range: 13.0-17.0 g/dL 10.8 (L)  HCT Latest Ref Range: 39.0-52.0 % 32.1 (L)  PTH Latest Ref Range: 15-65 pg/mL 111 (H)

## 2014-06-13 ENCOUNTER — Telehealth: Payer: Self-pay | Admitting: Internal Medicine

## 2014-06-13 NOTE — Telephone Encounter (Signed)
New problem   Pt calling wanting to know what does he need to do to go back to work.  Please advise.

## 2014-06-13 NOTE — Telephone Encounter (Signed)
Dr Lovena Le is seeing as needed and I will forward to Dr Shawna Orleans for review

## 2014-06-23 ENCOUNTER — Encounter: Payer: Self-pay | Admitting: Family Medicine

## 2014-06-23 ENCOUNTER — Ambulatory Visit (INDEPENDENT_AMBULATORY_CARE_PROVIDER_SITE_OTHER): Payer: Medicare Other | Admitting: Family Medicine

## 2014-06-23 ENCOUNTER — Institutional Professional Consult (permissible substitution): Payer: Medicare Other | Admitting: Internal Medicine

## 2014-06-23 VITALS — BP 200/50 | HR 78 | Temp 98.0°F | Resp 18 | Ht 71.0 in | Wt 196.0 lb

## 2014-06-23 DIAGNOSIS — Z794 Long term (current) use of insulin: Secondary | ICD-10-CM | POA: Diagnosis not present

## 2014-06-23 DIAGNOSIS — E119 Type 2 diabetes mellitus without complications: Secondary | ICD-10-CM

## 2014-06-23 DIAGNOSIS — Z125 Encounter for screening for malignant neoplasm of prostate: Secondary | ICD-10-CM | POA: Diagnosis not present

## 2014-06-23 DIAGNOSIS — Z0000 Encounter for general adult medical examination without abnormal findings: Secondary | ICD-10-CM

## 2014-06-23 DIAGNOSIS — IMO0001 Reserved for inherently not codable concepts without codable children: Secondary | ICD-10-CM

## 2014-06-23 LAB — COMPLETE METABOLIC PANEL WITH GFR
ALK PHOS: 70 U/L (ref 39–117)
AST: 12 U/L (ref 0–37)
Albumin: 3.5 g/dL (ref 3.5–5.2)
BUN: 54 mg/dL — AB (ref 6–23)
CALCIUM: 9.2 mg/dL (ref 8.4–10.5)
CHLORIDE: 103 meq/L (ref 96–112)
CO2: 24 meq/L (ref 19–32)
Creat: 3.76 mg/dL — ABNORMAL HIGH (ref 0.50–1.35)
GFR, Est African American: 18 mL/min — ABNORMAL LOW
GFR, Est Non African American: 15 mL/min — ABNORMAL LOW
GLUCOSE: 56 mg/dL — AB (ref 70–99)
Potassium: 3.7 mEq/L (ref 3.5–5.3)
SODIUM: 142 meq/L (ref 135–145)
Total Bilirubin: 0.5 mg/dL (ref 0.2–1.2)
Total Protein: 6.1 g/dL (ref 6.0–8.3)

## 2014-06-23 LAB — CBC WITH DIFFERENTIAL/PLATELET
BASOS ABS: 0 10*3/uL (ref 0.0–0.1)
BASOS PCT: 0 % (ref 0–1)
Eosinophils Absolute: 0.5 10*3/uL (ref 0.0–0.7)
Eosinophils Relative: 5 % (ref 0–5)
HCT: 37.4 % — ABNORMAL LOW (ref 39.0–52.0)
Hemoglobin: 12.6 g/dL — ABNORMAL LOW (ref 13.0–17.0)
LYMPHS ABS: 0.8 10*3/uL (ref 0.7–4.0)
Lymphocytes Relative: 8 % — ABNORMAL LOW (ref 12–46)
MCH: 30.3 pg (ref 26.0–34.0)
MCHC: 33.7 g/dL (ref 30.0–36.0)
MCV: 89.9 fL (ref 78.0–100.0)
MONO ABS: 1.2 10*3/uL — AB (ref 0.1–1.0)
MONOS PCT: 13 % — AB (ref 3–12)
MPV: 8.6 fL (ref 8.6–12.4)
NEUTROS ABS: 7 10*3/uL (ref 1.7–7.7)
NEUTROS PCT: 74 % (ref 43–77)
Platelets: 245 10*3/uL (ref 150–400)
RBC: 4.16 MIL/uL — ABNORMAL LOW (ref 4.22–5.81)
RDW: 16 % — AB (ref 11.5–15.5)
WBC: 9.5 10*3/uL (ref 4.0–10.5)

## 2014-06-23 LAB — LIPID PANEL
Cholesterol: 168 mg/dL (ref 0–200)
HDL: 82 mg/dL (ref >=40)
LDL CALC: 74 mg/dL (ref 0–99)
Total CHOL/HDL Ratio: 2 Ratio
Triglycerides: 60 mg/dL (ref <150)
VLDL: 12 mg/dL (ref 0–40)

## 2014-06-23 LAB — HEMOGLOBIN A1C
Hgb A1c MFr Bld: 6 % — ABNORMAL HIGH (ref <5.7)
Mean Plasma Glucose: 126 mg/dL — ABNORMAL HIGH (ref <117)

## 2014-06-23 MED ORDER — COLCHICINE 0.6 MG PO TABS: ORAL_TABLET | ORAL | Status: DC

## 2014-06-23 NOTE — Progress Notes (Signed)
Subjective:    Patient ID: [name], male    DOB: 28-Mar-1945, 69 y.o.   MRN: [id number]  HPI  05/23/14 Patient is here today for follow-up. Patient looks extremely weak.  Patient has lost 10 pounds since March. He looks dehydrated. In the hospital, the patient perked up when he was given IV fluids. There was no evidence of acidosis and his potassium was stable and therefore there was no indication for emergent dialysis. However the patient now reports nausea. He reports inability to tolerate by mouth. He reports profound weakness. He is here today for hospital follow-up.  At that time, my plan was: I spent 30 minutes with the patient discussing his medical comorbidities. I believe this is a perfect storm of his ongoing medical problems. I believe his kidney failure is worsening. Although he has no indications for emergent dialysis, I believe the symptoms are a sign that the patient is becoming uremic and would benefit from hemodialysis. Coupled with severe stage IV heart failure, I believe this is a completely logical explanation for his profound fatigue, nausea, early satiety, and weakness. I see very little that I can offer in the way to help this patient. However I do think the patient would benefit from a decreased dose of his diuretic. He is clinically dehydrated today. I believe this is exacerbating his weakness and likely contributing to his fatigue. Therefore I recommended decreasing the dose of his torsemide to 50 mg twice a day and that he discontinue metolazone. He is scheduled to follow-up with his nephrologist the first of next week. I recommended that he strongly consider beginning hemodialysis.  06/23/14 Patient is here for CPE.  [name] weight is back up. He appears much stronger today. He appears euvolemic. He went back to his previous dose of his diaphoretic. He has seen his nephrologist and at the present time there is no indication to proceed with dialysis. Unfortunately this  morning he developed a gout flare. He has severe pain in his left ankle. Patient discontinued uloric due to cost. This seemed to contribute to his recent gout flare. However he cannot afford the dictation and allopurinol did not work for him in the past. He has seen the cardiologist and they have decided not to proceed with a ICD.  He has not taken any of his medication in more than 24 hours which explains his blood pressure Past Medical History  Diagnosis Date  . Chronic combined systolic and diastolic CHF (congestive heart failure)     a. 02/2014 Echo: EF 20-25% (new), Gr 2 DD, mod conc LVH, mild AI/AS, mod MR, sev dil LA, mild TR.  Marland Kitchen Nonischemic cardiomyopathy     a. 02/2014 Echo: EF 20-25%;  b. 03/2014 Cath: LM nl, LAD min irregs, LCX   . 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 Hypertension   . Chronic kidney disease (CKD), stage IV (severe) 12/2009    a. baseline creat now ~ 4.  . Erectile dysfunction   . Colonic polyp   . Low serum testosterone level   . Tobacco abuse   . Sleep apnea 2010    Initially unable to afford CPAP  . Benign prostatic hypertrophy   . Obesity, Class I, BMI 30-34.9   . Hyperlipidemia   . Congestive heart failure (CHF)     EF 25-30% 07/2011  . Adenomatous colon polyp 2006  . H. pylori infection 2013    treated with prevpac  .  Mild Ao Stenosis and Insufficiency    Past Surgical History  Procedure Laterality Date  . Retinal laser procedure      diabetic retinopathy  . Lymph node biopsy      surgical exploration of neck-not entirely clear that this represented a lymph node biopsy  . Colonoscopy  08/23/2004    Dr. Vivi Ferns rectum, diminutive polyp of the rectosigmoid removed, inflamed focally adenomatous polyp  . Left and right heart catheterization with coronary angiogram N/A 03/19/2014    Procedure: LEFT AND RIGHT HEART CATHETERIZATION WITH CORONARY ANGIOGRAM;  Surgeon: Blane Ohara, MD;  Location: [facility] CATH LAB;  Service:  Cardiovascular;  Laterality: N/A;   Current Outpatient Prescriptions on File Prior to Visit  Medication Sig Dispense Refill  . amLODipine (NORVASC) 10 MG tablet Take 1 tablet (10 mg total) by mouth daily. 30 tablet 6  . aspirin EC 81 MG tablet Take 81 mg by mouth daily.    . carvedilol (COREG) 12.5 MG tablet Take 1 tablet (12.5 mg total) by mouth 2 (two) times daily with a meal. 180 tablet 3  . cloNIDine (CATAPRES) 0.3 MG tablet Take 0.3 mg by mouth 2 (two) times daily.     . febuxostat (ULORIC) 40 MG tablet Take 1 tablet (40 mg total) by mouth daily. 30 tablet 2  . hydrALAZINE (APRESOLINE) 50 MG tablet Take 1 tablet (50 mg total) by mouth 2 (two) times daily. 180 tablet 3  . Insulin Glargine (LANTUS SOLOSTAR) 100 UNIT/ML Solostar Pen Inject 30 Units into the skin daily at 10 pm. 5 pen 3  . metolazone (ZAROXOLYN) 2.5 MG tablet 2.5 mg Daily x 3 days and then 2.5 mg every Monday, Wednesday, and Friday. 30 tablet 3  . potassium chloride SA (K-DUR,KLOR-CON) 20 MEQ tablet Take 1 tablet (20 mEq total) by mouth 3 (three) times daily. 90 tablet 3  . torsemide (DEMADEX) 100 MG tablet Take 100 mg by mouth 2 (two) times daily.      No current facility-administered medications on file prior to visit.   Allergies  Allergen Reactions  . Ace Inhibitors Cough   History   Social History  . Marital Status: Married    Spouse Name: N/A  . Number of Children: 4  . Years of Education: N/A   Occupational History  . Semi - Retired Administrator    Social History Main Topics  . Smoking status: Current Every Day Smoker -- 0.25 packs/day for 45 years    Types: Cigarettes    Start date: 05/31/1958  . Smokeless tobacco: Current User     Comment: 1-2 cigarettes OF THE NEW ELECTRIC CIGARETTES  . Alcohol Use: 2.4 oz/week    2 Glasses of wine, 2 Shots of liquor per week     Comment: occ  . Drug Use: No  . Sexual Activity: Not on file   Other Topics Concern  . Not on file   Social History Narrative    Family History  Problem Relation Age of Onset  . Hypertension Mother   . Cancer Mother   . Cancer Father   . Diabetes Sister      Review of Systems  All other systems reviewed and are negative.      Objective:   Physical Exam  Constitutional: He is oriented to person, place, and time. He appears well-developed and well-nourished.  Non-toxic appearance. He does not have a sickly appearance. He does not appear ill. No distress.  HENT:  Head: Normocephalic and atraumatic.  Right  Ear: External ear normal.  Left Ear: External ear normal.  Nose: Nose normal.  Mouth/Throat: Oropharynx is clear and moist. No oropharyngeal exudate.  Eyes: Conjunctivae and EOM are normal. Pupils are equal, round, and reactive to light. Right eye exhibits no discharge. Left eye exhibits no discharge. No scleral icterus.  Neck: Normal range of motion. Neck supple. No JVD present. No tracheal deviation present. No thyromegaly present.  Cardiovascular: Normal rate, regular rhythm, normal heart sounds and intact distal pulses.  Exam reveals no gallop and no friction rub.   No murmur heard. Pulmonary/Chest: Effort normal and breath sounds normal. No stridor. No respiratory distress. He has no wheezes. He has no rales. He exhibits no tenderness.  Abdominal: Soft. Bowel sounds are normal. He exhibits no distension and no mass. There is no tenderness. There is no rebound and no guarding.  Genitourinary: Guaiac negative stool.  Musculoskeletal: He exhibits no edema.       Left ankle: He exhibits decreased range of motion and swelling. Tenderness.  Lymphadenopathy:    He has no cervical adenopathy.  Neurological: He is alert and oriented to person, place, and time. He has normal reflexes. He displays normal reflexes. No cranial nerve deficit. He exhibits normal muscle tone. Coordination normal.  Skin: Skin is warm. No rash noted. He is not diaphoretic. No erythema. No pallor.  Psychiatric: He has a normal mood and  affect. His behavior is normal. Judgment and thought content normal.  Vitals reviewed.         Assessment & Plan:  Routine general medical examination at a health care facility - Plan: CBC with Differential/Platelet, COMPLETE METABOLIC PANEL WITH GFR, Lipid panel, PSA, Medicare  IDDM (insulin dependent diabetes mellitus) - Plan: CBC with Differential/Platelet, COMPLETE METABOLIC PANEL WITH GFR, Lipid panel, Hemoglobin A1c  Resume blood pressure medication immediately. Obtain fasting lab work including a CBC, CMP, fasting lipid panel, and PSA. Patient would like to defer the rectal exam today because of the pain in his hand with his ankle. His colonoscopy was in 2013 and is not yet due. Patient has had Pneumovax 23, Prevnar 13, his flu shot, and his tetanus vaccine. Therefore immunizations are up-to-date. I'll give the patient colchicine 0.6 mg tabs for his gout attack. He can take 2 tablets immediately and then 1 additional tablet in 2 hours if the pain persists.

## 2014-06-23 NOTE — Progress Notes (Signed)
   Subjective:    Patient ID: [name], male    DOB: 19-Jan-1945, 69 y.o.   MRN: [id number]  HPI    Review of Systems     Objective:   Physical Exam        Assessment & Plan:  Physical exam requires an addendum. Itches states that the patient has a very loud 3 to 4/6 systolic ejection murmur but he is had a echocardiogram performed in February of this year which showed moderate mitral regurgitation and otherwise the remaining valve structures appeared normal. He also underwent cardiac catheterization which was completely clear. Patient does have nonischemic cardiomyopathy with an ejection fraction of 20-25% managed medically by cardiology

## 2014-06-24 LAB — PSA, MEDICARE: PSA: 0.85 ng/mL (ref <=4.00)

## 2014-06-25 ENCOUNTER — Telehealth: Payer: Self-pay | Admitting: Cardiovascular Disease

## 2014-06-25 NOTE — Telephone Encounter (Signed)
We gave patient patient return to work with limitations in march and he wants to RTW without restrictions if you approve

## 2014-06-25 NOTE — Telephone Encounter (Signed)
Would recommend not lifting anything over 30 lbs. Has numerous comorbidities including severe chronic systolic heart failure.

## 2014-06-25 NOTE — Telephone Encounter (Signed)
Patient wants letter to return to work. But it has to state may return with no restrictions / tg

## 2014-06-25 NOTE — Telephone Encounter (Signed)
Patient wants me to ask Dr.Taylor if he can return to work without limitations

## 2014-06-26 NOTE — Telephone Encounter (Signed)
May return to work without limitation.

## 2014-06-27 ENCOUNTER — Ambulatory Visit: Payer: Medicare Other | Admitting: Cardiovascular Disease

## 2014-06-27 NOTE — Telephone Encounter (Signed)
Patient made aware,will pick up letter from office today

## 2014-07-08 ENCOUNTER — Ambulatory Visit (HOSPITAL_COMMUNITY): Payer: Medicare Other

## 2014-07-08 ENCOUNTER — Other Ambulatory Visit (HOSPITAL_COMMUNITY): Payer: Medicare Other

## 2014-07-14 ENCOUNTER — Encounter (HOSPITAL_COMMUNITY)
Admission: RE | Admit: 2014-07-14 | Discharge: 2014-07-14 | Disposition: A | Discharge status: Home / Self Care | Payer: Medicare Other | Source: Ambulatory Visit | Attending: Nephrology | Admitting: Nephrology

## 2014-07-14 DIAGNOSIS — N184 Chronic kidney disease, stage 4 (severe): Secondary | ICD-10-CM | POA: Insufficient documentation

## 2014-07-14 DIAGNOSIS — D631 Anemia in chronic kidney disease: Secondary | ICD-10-CM | POA: Insufficient documentation

## 2014-07-14 DIAGNOSIS — Z79899 Other long term (current) drug therapy: Secondary | ICD-10-CM | POA: Insufficient documentation

## 2014-07-14 DIAGNOSIS — Z5181 Encounter for therapeutic drug level monitoring: Secondary | ICD-10-CM | POA: Insufficient documentation

## 2014-07-14 LAB — HEMOGLOBIN AND HEMATOCRIT, BLOOD
HEMATOCRIT: 33.4 % — AB (ref 39.0–52.0)
HEMOGLOBIN: 11.2 g/dL — AB (ref 13.0–17.0)

## 2014-07-14 MED ORDER — DARBEPOETIN ALFA 100 MCG/0.5ML IJ SOSY
100.0000 ug | PREFILLED_SYRINGE | INTRAMUSCULAR | Status: DC
  Administered 2014-07-14: 100 ug via SUBCUTANEOUS

## 2014-07-14 MED ORDER — DARBEPOETIN ALFA 100 MCG/0.5ML IJ SOSY
PREFILLED_SYRINGE | INTRAMUSCULAR | Status: AC
  Filled 2014-07-14: qty 0.5

## 2014-07-28 ENCOUNTER — Encounter: Payer: Self-pay | Admitting: Family Medicine

## 2014-07-28 ENCOUNTER — Ambulatory Visit (INDEPENDENT_AMBULATORY_CARE_PROVIDER_SITE_OTHER): Payer: Medicare Other | Admitting: Family Medicine

## 2014-07-28 VITALS — BP 120/54 | HR 72 | Temp 97.5°F | Resp 18 | Ht 71.0 in | Wt 198.0 lb

## 2014-07-28 DIAGNOSIS — M7989 Other specified soft tissue disorders: Secondary | ICD-10-CM | POA: Diagnosis not present

## 2014-07-28 NOTE — Progress Notes (Signed)
Subjective:    Patient ID: [name], male    DOB: [birth year], 69 y.o.   MRN: [id number]  HPI  Patient has recently started amlodipine within the last few months. He now has pronounced swelling in both legs. His weight is only up 2 pounds from his last office visit. He denies any chest pain. He denies any shortness of breath or dyspnea on exertion. He denies any orthopnea or paroxysmal nocturnal dyspnea. He denies any oliguria. In fact he reports polyuria. He has underlying severe congestive heart failure with an ejection fraction of approximately 25%. He also has stage IV chronic kidney disease is severely decreased GFR. However these seem stable and have not changed. The swelling seems to coincide with the addition of amlodipine. His blood pressure today is well controlled at 120/54. Past Medical History  Diagnosis Date  . Chronic combined systolic and diastolic CHF (congestive heart failure)     a. 02/2014 Echo: EF 20-25% (new), Gr 2 DD, mod conc LVH, mild AI/AS, mod MR, sev dil LA, mild TR.  Marland Kitchen Nonischemic cardiomyopathy     a. 02/2014 Echo: EF 20-25%;  b. 03/2014 Cath: LM nl, LAD min irregs, LCX   . 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 Hypertension   . Chronic kidney disease (CKD), stage IV (severe) 12/2009    a. baseline creat now ~ 4.  . Erectile dysfunction   . Colonic polyp   . Low serum testosterone level   . Tobacco abuse   . Sleep apnea 2010    Initially unable to afford CPAP  . Benign prostatic hypertrophy   . Obesity, Class I, BMI 30-34.9   . Hyperlipidemia   . Congestive heart failure (CHF)     EF 25-30% 07/2011  . Adenomatous colon polyp 2006  . H. pylori infection 2013    treated with prevpac  . Mild Ao Stenosis and Insufficiency    Past Surgical History  Procedure Laterality Date  . Retinal laser procedure      diabetic retinopathy  . Lymph node biopsy      surgical exploration of neck-not entirely clear that this represented a  lymph node biopsy  . Colonoscopy  08/23/2004    Dr. Vivi Ferns rectum, diminutive polyp of the rectosigmoid removed, inflamed focally adenomatous polyp  . Left and right heart catheterization with coronary angiogram N/A 03/19/2014    Procedure: LEFT AND RIGHT HEART CATHETERIZATION WITH CORONARY ANGIOGRAM;  Surgeon: Blane Ohara, MD;  Location: [facility] CATH LAB;  Service: Cardiovascular;  Laterality: N/A;   Current Outpatient Prescriptions on File Prior to Visit  Medication Sig Dispense Refill  . amLODipine (NORVASC) 10 MG tablet Take 1 tablet (10 mg total) by mouth daily. 30 tablet 6  . aspirin EC 81 MG tablet Take 81 mg by mouth daily.    . carvedilol (COREG) 12.5 MG tablet Take 1 tablet (12.5 mg total) by mouth 2 (two) times daily with a meal. 180 tablet 3  . cloNIDine (CATAPRES) 0.3 MG tablet Take 0.3 mg by mouth 2 (two) times daily.     . colchicine 0.6 MG tablet Take 2 tabs at first sign of gout and repeat in 1 hour if pain persists 30 tablet 0  . febuxostat (ULORIC) 40 MG tablet Take 1 tablet (40 mg total) by mouth daily. 30 tablet 2  . hydrALAZINE (APRESOLINE) 100 MG tablet Take 100 mg by mouth 2 (two) times daily.     Marland Kitchen  Insulin Glargine (LANTUS SOLOSTAR) 100 UNIT/ML Solostar Pen Inject 30 Units into the skin daily at 10 pm. 5 pen 3  . metolazone (ZAROXOLYN) 2.5 MG tablet 2.5 mg Daily x 3 days and then 2.5 mg every Monday, Wednesday, and Friday. 30 tablet 3  . potassium chloride SA (K-DUR,KLOR-CON) 20 MEQ tablet Take 1 tablet (20 mEq total) by mouth 3 (three) times daily. 90 tablet 3  . torsemide (DEMADEX) 100 MG tablet Take 100 mg by mouth 2 (two) times daily.      No current facility-administered medications on file prior to visit.   Allergies  Allergen Reactions  . Ace Inhibitors Cough   History   Social History  . Marital Status: Married    Spouse Name: N/A  . Number of Children: 4  . Years of Education: N/A   Occupational History  . Semi - Retired Administrator    Social  History Main Topics  . Smoking status: Current Every Day Smoker -- 0.25 packs/day for 45 years    Types: Cigarettes    Start date: 05/31/1958  . Smokeless tobacco: Current User     Comment: 1-2 cigarettes OF THE NEW ELECTRIC CIGARETTES  . Alcohol Use: 2.4 oz/week    2 Glasses of wine, 2 Shots of liquor per week     Comment: occ  . Drug Use: No  . Sexual Activity: Not on file   Other Topics Concern  . Not on file   Social History Narrative     Review of Systems  All other systems reviewed and are negative.      Objective:   Physical Exam  Neck: No JVD present.  Cardiovascular: Normal rate, regular rhythm and normal heart sounds.   Pulmonary/Chest: Effort normal and breath sounds normal. No respiratory distress. He has no wheezes. He has no rales.  Musculoskeletal: He exhibits edema.  Vitals reviewed.         Assessment & Plan:  Leg swelling  I believe the leg swelling is likely due to the amlodipine. I will have him temporarily discontinue the medicine and then recheck on Thursday or Friday. The swelling improves and his blood pressure remains acceptable this will be the only intervention. The swelling is not improving, I would recommend repeating renal function test to ensure that his kidney function has not diminished. At the present time I see no evidence of acute congestive heart failure.

## 2014-08-11 ENCOUNTER — Encounter (HOSPITAL_COMMUNITY)
Admission: RE | Admit: 2014-08-11 | Discharge: 2014-08-11 | Disposition: A | Discharge status: Home / Self Care | Payer: Medicare Other | Source: Ambulatory Visit | Attending: Nephrology | Admitting: Nephrology

## 2014-08-11 ENCOUNTER — Encounter (HOSPITAL_COMMUNITY): Payer: Self-pay

## 2014-08-11 DIAGNOSIS — D631 Anemia in chronic kidney disease: Secondary | ICD-10-CM | POA: Diagnosis not present

## 2014-08-11 DIAGNOSIS — Z5181 Encounter for therapeutic drug level monitoring: Secondary | ICD-10-CM | POA: Insufficient documentation

## 2014-08-11 DIAGNOSIS — N184 Chronic kidney disease, stage 4 (severe): Secondary | ICD-10-CM | POA: Diagnosis not present

## 2014-08-11 DIAGNOSIS — Z79899 Other long term (current) drug therapy: Secondary | ICD-10-CM | POA: Diagnosis not present

## 2014-08-11 LAB — HEMOGLOBIN AND HEMATOCRIT, BLOOD
HEMATOCRIT: 32.2 % — AB (ref 39.0–52.0)
HEMOGLOBIN: 10.9 g/dL — AB (ref 13.0–17.0)

## 2014-08-11 LAB — IRON AND TIBC
Iron: 36 ug/dL — ABNORMAL LOW (ref 45–182)
SATURATION RATIOS: 16 % — AB (ref 17.9–39.5)
TIBC: 220 ug/dL — AB (ref 250–450)
UIBC: 184 ug/dL

## 2014-08-11 LAB — FERRITIN: Ferritin: 270 ng/mL (ref 24–336)

## 2014-08-11 MED ORDER — DARBEPOETIN ALFA 100 MCG/0.5ML IJ SOSY
100.0000 ug | PREFILLED_SYRINGE | INTRAMUSCULAR | Status: DC
Start: 2014-08-11 — End: 2014-08-12
  Administered 2014-08-11: 100 ug via SUBCUTANEOUS
  Filled 2014-08-11: qty 0.5

## 2014-08-12 NOTE — Progress Notes (Signed)
Results for CHIP, CANEPA (MRN [id number]) as of 08/12/2014 07:20  Ref. Range 08/11/2014 08:16  Iron Latest Ref Range: 45-182 ug/dL 36 (L)  UIBC Latest Units: ug/dL 184  TIBC Latest Ref Range: 250-450 ug/dL 220 (L)  Saturation Ratios Latest Ref Range: 17.9-39.5 % 16 (L)  Ferritin Latest Ref Range: 24-336 ng/mL 270  Hemoglobin Latest Ref Range: 13.0-17.0 g/dL 10.9 (L)  HCT Latest Ref Range: 39.0-52.0 % 32.2 (L)

## 2014-08-13 ENCOUNTER — Encounter: Payer: Self-pay | Admitting: Physician Assistant

## 2014-08-13 ENCOUNTER — Ambulatory Visit (INDEPENDENT_AMBULATORY_CARE_PROVIDER_SITE_OTHER): Payer: Medicare Other | Admitting: Physician Assistant

## 2014-08-13 VITALS — BP 116/52 | HR 55 | Ht 70.0 in | Wt 200.8 lb

## 2014-08-13 DIAGNOSIS — I429 Cardiomyopathy, unspecified: Secondary | ICD-10-CM

## 2014-08-13 DIAGNOSIS — I359 Nonrheumatic aortic valve disorder, unspecified: Secondary | ICD-10-CM

## 2014-08-13 DIAGNOSIS — I5022 Chronic systolic (congestive) heart failure: Secondary | ICD-10-CM | POA: Diagnosis not present

## 2014-08-13 DIAGNOSIS — I10 Essential (primary) hypertension: Secondary | ICD-10-CM | POA: Diagnosis not present

## 2014-08-13 DIAGNOSIS — I428 Other cardiomyopathies: Secondary | ICD-10-CM

## 2014-08-13 NOTE — Assessment & Plan Note (Signed)
Mild AS and AI

## 2014-08-13 NOTE — Progress Notes (Signed)
Cardiology Office Note   Date:  08/13/2014   ID:  NATANEL SNAVELY, DOB Apr 27, 1945, MRN [id number]  PCP:  Odette Fraction, MD  Cardiologist: Dr. Bronson Ing  Chief Complaint: Low blood pressure    History of Present Illness: [name] is a 69 y.o. male who presents for concerns that his diastolic blood pressure is too low. It has been running as low as 36-50 at home. He feels fine and has had no symptoms. His heart failure is been pretty well-controlled and he takes metolazone about twice a week. He says the hydralazine is causing an increase in his gout flares. He'd like to come off of this. His amlodipine was stopped on 07/28/14 by Dr. Dennard Schaumann because of leg edema and this has helped.  Patient has history of long-standing severe LV dysfunction dating back to 2010. His EF normalized by echo in 2014 but he developed heart failure again in February 2016. Nuclear stress test EF 20% with scar and cath in March demonstrated no CAD but severe LV dysfunction. He has been on maximum medical therapy but cannot take an ace/ARB due to renal insufficiency. He has stage V renal insufficiency with creatinines in the 3-4 range. He was evaluated by Dr. Lovena Le for an ICD but wants to hold off at this time.   Past Medical History  Diagnosis Date  . Chronic combined systolic and diastolic CHF (congestive heart failure)     a. 02/2014 Echo: EF 20-25% (new), Gr 2 DD, mod conc LVH, mild AI/AS, mod MR, sev dil LA, mild TR.  Marland Kitchen Nonischemic cardiomyopathy     a. 02/2014 Echo: EF 20-25%;  b. 03/2014 Cath: LM nl, LAD min irregs, LCX   . 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 Hypertension   . Chronic kidney disease (CKD), stage IV (severe) 12/2009    a. baseline creat now ~ 4.  . Erectile dysfunction   . Colonic polyp   . Low serum testosterone level   . Tobacco abuse   . Sleep apnea 2010    Initially unable to afford CPAP  . Benign prostatic hypertrophy   . Obesity, Class I, BMI  30-34.9   . Hyperlipidemia   . Congestive heart failure (CHF)     EF 25-30% 07/2011  . Adenomatous colon polyp 2006  . H. pylori infection 2013    treated with prevpac  . Mild Ao Stenosis and Insufficiency     Past Surgical History  Procedure Laterality Date  . Retinal laser procedure      diabetic retinopathy  . Lymph node biopsy      surgical exploration of neck-not entirely clear that this represented a lymph node biopsy  . Colonoscopy  08/23/2004    Dr. Vivi Ferns rectum, diminutive polyp of the rectosigmoid removed, inflamed focally adenomatous polyp  . Left and right heart catheterization with coronary angiogram N/A 03/19/2014    Procedure: LEFT AND RIGHT HEART CATHETERIZATION WITH CORONARY ANGIOGRAM;  Surgeon: Blane Ohara, MD;  Location: [facility] CATH LAB;  Service: Cardiovascular;  Laterality: N/A;     Current Outpatient Prescriptions  Medication Sig Dispense Refill  . aspirin EC 81 MG tablet Take 81 mg by mouth daily.    . carvedilol (COREG) 12.5 MG tablet Take 1 tablet (12.5 mg total) by mouth 2 (two) times daily with a meal. 180 tablet 3  . cloNIDine (CATAPRES) 0.3 MG tablet Take 0.3 mg by mouth 2 (two) times daily.     Marland Kitchen  colchicine 0.6 MG tablet Take 2 tabs at first sign of gout and repeat in 1 hour if pain persists 30 tablet 0  . hydrALAZINE (APRESOLINE) 100 MG tablet Take 100 mg by mouth 2 (two) times daily.     . Insulin Glargine (LANTUS SOLOSTAR) 100 UNIT/ML Solostar Pen Inject 30 Units into the skin daily at 10 pm. 5 pen 3  . metolazone (ZAROXOLYN) 2.5 MG tablet 2.5 mg Daily x 3 days and then 2.5 mg every Monday, Wednesday, and Friday. 30 tablet 3  . potassium chloride SA (K-DUR,KLOR-CON) 20 MEQ tablet Take 1 tablet (20 mEq total) by mouth 3 (three) times daily. 90 tablet 3  . torsemide (DEMADEX) 100 MG tablet Take 100 mg by mouth 2 (two) times daily.      No current facility-administered medications for this visit.    Allergies:   Ace inhibitors    Social  History:  The patient  reports that he has been smoking Cigarettes.  He started smoking about 56 years ago. He has a 11.25 pack-year smoking history. He uses smokeless tobacco. He reports that he drinks about 2.4 oz of alcohol per week. He reports that he does not use illicit drugs.   Family History:  The patient's family history includes Cancer in his father and mother; Diabetes in his sister; Hypertension in his mother.    ROS:  Please see the history of present illness.   Otherwise, review of systems are positive for none.   All other systems are reviewed and negative.    PHYSICAL EXAM: VS:  BP 116/52 mmHg  Pulse 55  Ht 5\' 10"  (1.778 m)  Wt 200 lb 12.8 oz (91.082 kg)  BMI 28.81 kg/m2  SpO2 96% , BMI Body mass index is 28.81 kg/(m^2). GEN: Well nourished, well developed, in no acute distress Neck: no JVD, HJR, carotid bruits, or masses Cardiac: RRR; 2/6 systolic murmur at the left sternal border and apex, 2/6 diastolic murmur at the left sternal border, no gallop, rubs, thrill or heave,  Respiratory:  clear to auscultation bilaterally, normal work of breathing GI: soft, nontender, nondistended, + BS MS: no deformity or atrophy Extremities: +2 edema left greater than right otherwise without cyanosis, clubbing,  Skin: warm and dry, no rash Neuro:  Strength and sensation are intact    EKG:  EKG is not ordered today.    Recent Labs: 12/01/2013: Pro B Natriuretic peptide (BNP) 37333.0* 01/22/2014: B Natriuretic Peptide >4500.0* 02/27/2014: Magnesium 1.6 05/21/2014: TSH 0.915 06/23/2014: ALT <8; BUN 54*; Creat 3.76*; Platelets 245; Potassium 3.7; Sodium 142 08/11/2014: Hemoglobin 10.9*    Lipid Panel    Component Value Date/Time   CHOL 168 06/23/2014 0856   TRIG 60 06/23/2014 0856   HDL 82 06/23/2014 0856   CHOLHDL 2.0 06/23/2014 0856   VLDL 12 06/23/2014 0856   LDLCALC 74 06/23/2014 0856      Wt Readings from Last 3 Encounters:  08/13/14 200 lb 12.8 oz (91.082 kg)  08/11/14  198 lb (89.812 kg)  07/28/14 198 lb (89.812 kg)      Other studies Reviewed: Additional studies/ records that were reviewed today include and review of the records demonstrates:  2-D echo 2/19/16Study Conclusions  - Left ventricle: The cavity size was mildly dilated. There was   moderate concentric hypertrophy. Systolic function was severely   reduced. The estimated ejection fraction was in the range of 20%   to 25%. Severe global hypokinesis is noted. Features are   consistent with a pseudonormal  left ventricular filling pattern,   with concomitant abnormal relaxation and increased filling   pressure (grade 2 diastolic dysfunction). Doppler parameters are   consistent with high ventricular filling pressure. - Aortic valve: Mildly calcified annulus. Trileaflet; mildly   thickened leaflets. There was mild regurgitation. There is mild   aortic stenosis. Peak velocity 2.6 m/s. Mean gradient (S): 11 mm   Hg. - Mitral valve: Mildly thickened, mildly calcified leaflets .   Restricted leaflet mobility due to left ventricular dysfunction.   There was moderate regurgitation. - Left atrium: The atrium was severely dilated. Volume/bsa, ES,   (1-plane Simpson&'s, A2C): 43.9 ml/m^2. - Right ventricle: Systolic function was mildly reduced. - Right atrium: The atrium was mildly dilated. - Atrial septum: There was a patent foramen ovale. - Tricuspid valve: There was mild regurgitation.  Impressions:  - When compare to the report dated 07/16/12, left ventricular   systolic function has severely declined.    ASSESSMENT AND PLAN:  Chronic systolic congestive heart failure Patient has chronic systolic heart failure. His weight is actually up about 10 pounds today but he took a Zaroxolyn last night. He is monitored closely by his renal doctor. He feels good and is not short of breath. He'd like to come off the hydralazine because of increased gout flares. With his severe LV dysfunction I'm  reluctant to stop this. He can't take a/ARB because of his renal insufficiency. Discuss with Dr.Koneswaran at next office visit. Continue current Demadex dose with when necessary Zaroxolyn.  Essential hypertension Patient is concerned because his diastolic blood pressure has been low. He is asymptomatic with this. I reassured him that his numbers are stable and to continue his current medications.  Nonischemic cardiomyopathy Evaluated by Dr. Lovena Le for ICD. Patient does not want to have it at this time.  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

## 2014-08-13 NOTE — Assessment & Plan Note (Signed)
Patient has chronic systolic heart failure. His weight is actually up about 10 pounds today but he took a Zaroxolyn last night. He is monitored closely by his renal doctor. He feels good and is not short of breath. He'd like to come off the hydralazine because of increased gout flares. With his severe LV dysfunction I'm reluctant to stop this. He can't take a/ARB because of his renal insufficiency. Discuss with Dr.Koneswaran at next office visit. Continue current Demadex dose with when necessary Zaroxolyn.

## 2014-08-13 NOTE — Assessment & Plan Note (Signed)
Evaluated by Dr. Lovena Le for ICD. Patient does not want to have it at this time.

## 2014-08-13 NOTE — Assessment & Plan Note (Signed)
Patient is concerned because his diastolic blood pressure has been low. He is asymptomatic with this. I reassured him that his numbers are stable and to continue his current medications.

## 2014-08-13 NOTE — Patient Instructions (Signed)
Your physician recommends that you schedule a follow-up appointment in: 2 months with [place]    Your physician recommends that you continue on your current medications as directed. Please refer to the Current Medication list given to you today.     Thank you for choosing [facility] !

## 2014-08-22 ENCOUNTER — Ambulatory Visit (INDEPENDENT_AMBULATORY_CARE_PROVIDER_SITE_OTHER): Payer: Medicare Other | Admitting: Family Medicine

## 2014-08-22 ENCOUNTER — Encounter: Payer: Self-pay | Admitting: Family Medicine

## 2014-08-22 VITALS — BP 140/50 | HR 68 | Temp 98.2°F | Resp 18 | Ht 71.0 in | Wt 205.0 lb

## 2014-08-22 DIAGNOSIS — M25562 Pain in left knee: Secondary | ICD-10-CM

## 2014-08-22 NOTE — Progress Notes (Signed)
Subjective:    Patient ID: [name], male    DOB: Jun 10, 1945, 70 y.o.   MRN: [id number]  HPI Patient presents with significant pain in his left knee. The pain is located in the anterior portion of the knee joint underneath the patella. He has an effusion in the knee. There is no erythema or warmth. He complains of tightness and pain in the knee. It is worse with standing and walking.  Due to his chronic kidney disease he cannot take NSAIDs. Tylenol is providing him very little relief. Past Medical History  Diagnosis Date  . Chronic combined systolic and diastolic CHF (congestive heart failure)     a. 02/2014 Echo: EF 20-25% (new), Gr 2 DD, mod conc LVH, mild AI/AS, mod MR, sev dil LA, mild TR.  Marland Kitchen Nonischemic cardiomyopathy     a. 02/2014 Echo: EF 20-25%;  b. 03/2014 Cath: LM nl, LAD min irregs, LCX   . 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 Hypertension   . Chronic kidney disease (CKD), stage IV (severe) 12/2009    a. baseline creat now ~ 4.  . Erectile dysfunction   . Colonic polyp   . Low serum testosterone level   . Tobacco abuse   . Sleep apnea 2010    Initially unable to afford CPAP  . Benign prostatic hypertrophy   . Obesity, Class I, BMI 30-34.9   . Hyperlipidemia   . Congestive heart failure (CHF)     EF 25-30% 07/2011  . Adenomatous colon polyp 2006  . H. pylori infection 2013    treated with prevpac  . Mild Ao Stenosis and Insufficiency    Past Surgical History  Procedure Laterality Date  . Retinal laser procedure      diabetic retinopathy  . Lymph node biopsy      surgical exploration of neck-not entirely clear that this represented a lymph node biopsy  . Colonoscopy  08/23/2004    Dr. Vivi Ferns rectum, diminutive polyp of the rectosigmoid removed, inflamed focally adenomatous polyp  . Left and right heart catheterization with coronary angiogram N/A 03/19/2014    Procedure: LEFT AND RIGHT HEART CATHETERIZATION WITH CORONARY ANGIOGRAM;   Surgeon: Blane Ohara, MD;  Location: [facility] CATH LAB;  Service: Cardiovascular;  Laterality: N/A;   Current Outpatient Prescriptions on File Prior to Visit  Medication Sig Dispense Refill  . aspirin EC 81 MG tablet Take 81 mg by mouth daily.    . carvedilol (COREG) 12.5 MG tablet Take 1 tablet (12.5 mg total) by mouth 2 (two) times daily with a meal. 180 tablet 3  . cloNIDine (CATAPRES) 0.3 MG tablet Take 0.3 mg by mouth 2 (two) times daily.     . colchicine 0.6 MG tablet Take 2 tabs at first sign of gout and repeat in 1 hour if pain persists 30 tablet 0  . hydrALAZINE (APRESOLINE) 100 MG tablet Take 100 mg by mouth 2 (two) times daily.     . Insulin Glargine (LANTUS SOLOSTAR) 100 UNIT/ML Solostar Pen Inject 30 Units into the skin daily at 10 pm. 5 pen 3  . metolazone (ZAROXOLYN) 2.5 MG tablet 2.5 mg Daily x 3 days and then 2.5 mg every Monday, Wednesday, and Friday. 30 tablet 3  . potassium chloride SA (K-DUR,KLOR-CON) 20 MEQ tablet Take 1 tablet (20 mEq total) by mouth 3 (three) times daily. 90 tablet 3  . torsemide (DEMADEX) 100 MG tablet Take 100 mg by mouth  2 (two) times daily.      No current facility-administered medications on file prior to visit.   Allergies  Allergen Reactions  . Ace Inhibitors Cough   History   Social History  . Marital Status: Married    Spouse Name: N/A  . Number of Children: 4  . Years of Education: N/A   Occupational History  . Semi - Retired Administrator    Social History Main Topics  . Smoking status: Current Every Day Smoker -- 0.25 packs/day for 45 years    Types: Cigarettes    Start date: 05/31/1958  . Smokeless tobacco: Current User     Comment: 1-2 cigarettes OF THE NEW ELECTRIC CIGARETTES  . Alcohol Use: 2.4 oz/week    2 Glasses of wine, 2 Shots of liquor per week     Comment: occ  . Drug Use: No  . Sexual Activity: Not on file   Other Topics Concern  . Not on file   Social History Narrative      Review of Systems  All other  systems reviewed and are negative.      Objective:   Physical Exam  Constitutional: He appears well-developed and well-nourished.  Cardiovascular: Normal rate.   Pulmonary/Chest: Effort normal and breath sounds normal.  Musculoskeletal:       Left knee: He exhibits decreased range of motion, swelling, effusion and bony tenderness. He exhibits no LCL laxity, normal meniscus and no MCL laxity. Tenderness found. Medial joint line and lateral joint line tenderness noted. No MCL and no LCL tenderness noted.  Vitals reviewed.         Assessment & Plan:  Left anterior knee pain  I believe he has left knee pain due to osteoarthritis and left knee. Given his other medical comorbidities, his best option is a cortisone injection. Patient agrees. Using sterile technique, I injected the left knee with 2 mL of lidocaine, 2 mL of Marcaine, and 2 mL of 40 mg per mL Kenalog. The patient tolerated the procedure well without complication.

## 2014-09-02 ENCOUNTER — Ambulatory Visit (INDEPENDENT_AMBULATORY_CARE_PROVIDER_SITE_OTHER): Payer: Medicare Other | Admitting: Family Medicine

## 2014-09-02 ENCOUNTER — Encounter: Payer: Self-pay | Admitting: Family Medicine

## 2014-09-02 VITALS — BP 150/50 | HR 68 | Temp 98.2°F | Resp 20

## 2014-09-02 DIAGNOSIS — M1000 Idiopathic gout, unspecified site: Secondary | ICD-10-CM

## 2014-09-02 DIAGNOSIS — M7989 Other specified soft tissue disorders: Secondary | ICD-10-CM

## 2014-09-02 MED ORDER — PREDNISONE 20 MG PO TABS: ORAL_TABLET | ORAL | Status: DC

## 2014-09-02 MED ORDER — COLCHICINE 0.6 MG PO TABS: ORAL_TABLET | ORAL | Status: DC

## 2014-09-02 NOTE — Progress Notes (Signed)
Subjective:    Patient ID: [name], male    DOB: 12/02/1945, 69 y.o.   MRN: [id number]  HPI Patient has a significant history of nonischemic cardiomyopathy with markedly reduced ejection fraction. He also has borderline hemodialysis dependent chronic kidney disease. He presents today complaining of pain in his left ankle. The left ankle is erythematous tender and swollen. He has decreased range of motion in the left ankle. This is consistent with his typical gout exacerbation. However he has marked edema in both legs +2 to the knee that is not responding to torsemide. He denies any chest pain shortness of breath dyspnea on exertion or orthopnea. However by his scales at home he has gained approximately 10 pounds Past Medical History  Diagnosis Date  . Chronic combined systolic and diastolic CHF (congestive heart failure)     a. 02/2014 Echo: EF 20-25% (new), Gr 2 DD, mod conc LVH, mild AI/AS, mod MR, sev dil LA, mild TR.  Marland Kitchen Nonischemic cardiomyopathy     a. 02/2014 Echo: EF 20-25%;  b. 03/2014 Cath: LM nl, LAD min irregs, LCX   . 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 Hypertension   . Chronic kidney disease (CKD), stage IV (severe) 12/2009    a. baseline creat now ~ 4.  . Erectile dysfunction   . Colonic polyp   . Low serum testosterone level   . Tobacco abuse   . Sleep apnea 2010    Initially unable to afford CPAP  . Benign prostatic hypertrophy   . Obesity, Class I, BMI 30-34.9   . Hyperlipidemia   . Congestive heart failure (CHF)     EF 25-30% 07/2011  . Adenomatous colon polyp 2006  . H. pylori infection 2013    treated with prevpac  . Mild Ao Stenosis and Insufficiency    Past Surgical History  Procedure Laterality Date  . Retinal laser procedure      diabetic retinopathy  . Lymph node biopsy      surgical exploration of neck-not entirely clear that this represented a lymph node biopsy  . Colonoscopy  08/23/2004    Dr. Vivi Ferns rectum,  diminutive polyp of the rectosigmoid removed, inflamed focally adenomatous polyp  . Left and right heart catheterization with coronary angiogram N/A 03/19/2014    Procedure: LEFT AND RIGHT HEART CATHETERIZATION WITH CORONARY ANGIOGRAM;  Surgeon: Blane Ohara, MD;  Location: [facility] CATH LAB;  Service: Cardiovascular;  Laterality: N/A;   Current Outpatient Prescriptions on File Prior to Visit  Medication Sig Dispense Refill  . aspirin EC 81 MG tablet Take 81 mg by mouth daily.    . carvedilol (COREG) 12.5 MG tablet Take 1 tablet (12.5 mg total) by mouth 2 (two) times daily with a meal. 180 tablet 3  . cloNIDine (CATAPRES) 0.3 MG tablet Take 0.3 mg by mouth 2 (two) times daily.     . hydrALAZINE (APRESOLINE) 100 MG tablet Take 100 mg by mouth 2 (two) times daily.     . Insulin Glargine (LANTUS SOLOSTAR) 100 UNIT/ML Solostar Pen Inject 30 Units into the skin daily at 10 pm. 5 pen 3  . metolazone (ZAROXOLYN) 2.5 MG tablet 2.5 mg Daily x 3 days and then 2.5 mg every Monday, Wednesday, and Friday. 30 tablet 3  . ONE TOUCH ULTRA TEST test strip     . potassium chloride SA (K-DUR,KLOR-CON) 20 MEQ tablet Take 1 tablet (20 mEq total) by mouth 3 (three) times  daily. 90 tablet 3  . torsemide (DEMADEX) 100 MG tablet Take 100 mg by mouth 2 (two) times daily.      No current facility-administered medications on file prior to visit.   Allergies  Allergen Reactions  . Ace Inhibitors Cough   Social History   Social History  . Marital Status: Married    Spouse Name: N/A  . Number of Children: 4  . Years of Education: N/A   Occupational History  . Semi - Retired Administrator    Social History Main Topics  . Smoking status: Current Every Day Smoker -- 0.25 packs/day for 45 years    Types: Cigarettes    Start date: 05/31/1958  . Smokeless tobacco: Current User     Comment: 1-2 cigarettes OF THE NEW ELECTRIC CIGARETTES  . Alcohol Use: 2.4 oz/week    2 Glasses of wine, 2 Shots of liquor per week      Comment: occ  . Drug Use: No  . Sexual Activity: Not on file   Other Topics Concern  . Not on file   Social History Narrative      Review of Systems  All other systems reviewed and are negative.      Objective:   Physical Exam  Neck: Neck supple. No JVD present.  Cardiovascular: Normal rate, regular rhythm and normal heart sounds.   No murmur heard. Pulmonary/Chest: Effort normal and breath sounds normal. No respiratory distress. He has no wheezes. He has no rales.  Musculoskeletal: He exhibits edema.       Left ankle: He exhibits decreased range of motion and swelling. Tenderness.  Vitals reviewed.         Assessment & Plan:  Acute idiopathic gout, unspecified site - Plan: predniSONE (DELTASONE) 20 MG tablet, colchicine 0.6 MG tablet  Leg swelling  Patient is having a gout exacerbation. I would prefer him to use colchicine 1.2 mg by mouth 1 and repeat 0.6 mg in 1 hour if the pain persists. However the patient states that he cannot afford colchicine. If he is unable to afford the colchicine I did give him prescription for prednisone taper pack to treat a gout exacerbation. I am very concerned by the edema in both legs. He is also a fluid overloaded on exam. I have recommended that he take Zaroxolyn that he keeps at home then wait 30 minutes and take his torsemide 100 mg. Recheck in 24 hours. If the patient is not losing weight are experiencing significant diuresis, we will need to temporarily increase his torsemide but we will have to monitor his kidney function and potassium closely. At the present time there is no evidence of acute congestive heart failure or pulmonary edema

## 2014-09-04 ENCOUNTER — Telehealth: Payer: Self-pay | Admitting: Internal Medicine

## 2014-09-04 NOTE — Telephone Encounter (Signed)
Called PT back, he explained he has tried to reach Dr. Lovena Le numerous times with no luck. He is having trouble with a note that was supposed to be faxed to his employer for him to return back to work. He said he has left several message for a Janan Halter and he has not been contacted by her as of yet. I advised him to have his employer send a fax with a cover letter explaining what he needs filled out, so that he can return to work.

## 2014-09-04 NOTE — Telephone Encounter (Signed)
Spoke with patient's wife and let her know I have not gotten any messages but would be glad to print the note and place out front for him to pick up.  She will let him know and he will be by to morrow to pick up

## 2014-09-04 NOTE — Telephone Encounter (Signed)
Patient has questions about returning to work. / tg

## 2014-09-04 NOTE — Telephone Encounter (Signed)
Follow up      For [name] Pt want to know if he can come to the [address] office and get another copy of the June return to work note.  He said the [place] office is remodeling and it would be easier to pick up the note by Dr Lovena Le at the [place] office.  Please call and let him know if he can do that

## 2014-09-08 ENCOUNTER — Encounter (HOSPITAL_COMMUNITY)
Admission: RE | Admit: 2014-09-08 | Discharge: 2014-09-08 | Disposition: A | Discharge status: Home / Self Care | Payer: Medicare Other | Source: Ambulatory Visit | Attending: Nephrology | Admitting: Nephrology

## 2014-09-08 DIAGNOSIS — D509 Iron deficiency anemia, unspecified: Secondary | ICD-10-CM | POA: Diagnosis not present

## 2014-09-08 LAB — HEMOGLOBIN AND HEMATOCRIT, BLOOD
HCT: 34 % — ABNORMAL LOW (ref 39.0–52.0)
HEMOGLOBIN: 11.4 g/dL — AB (ref 13.0–17.0)

## 2014-09-08 MED ORDER — DARBEPOETIN ALFA 100 MCG/0.5ML IJ SOSY
100.0000 ug | PREFILLED_SYRINGE | INTRAMUSCULAR | Status: DC
  Administered 2014-09-08: 100 ug via SUBCUTANEOUS

## 2014-09-08 MED ORDER — SODIUM CHLORIDE 0.9 % IV SOLN
INTRAVENOUS | Status: DC
  Administered 2014-09-08: 09:00:00 via INTRAVENOUS

## 2014-09-08 MED ORDER — SODIUM CHLORIDE 0.9 % IV SOLN
510.0000 mg | Freq: Once | INTRAVENOUS | Status: AC
  Administered 2014-09-08: 510 mg via INTRAVENOUS
  Filled 2014-09-08: qty 17

## 2014-09-08 MED ORDER — DARBEPOETIN ALFA 100 MCG/0.5ML IJ SOSY
PREFILLED_SYRINGE | INTRAMUSCULAR | Status: AC
  Filled 2014-09-08: qty 0.5

## 2014-09-15 ENCOUNTER — Telehealth: Payer: Self-pay | Admitting: Cardiovascular Disease

## 2014-09-15 NOTE — Telephone Encounter (Signed)
Pt given letter back in June to return to work without restrictions,LM for pt to call [id number]

## 2014-09-15 NOTE — Telephone Encounter (Signed)
Needs to speak with someone about returning to work

## 2014-09-15 NOTE — Telephone Encounter (Signed)
Pt's work is still having issues with his job letting him go back to work.I told him there is nothing to I can do for him.Patientis going to give them return to work again

## 2014-09-25 ENCOUNTER — Other Ambulatory Visit: Payer: Medicare Other

## 2014-09-25 DIAGNOSIS — IMO0002 Reserved for concepts with insufficient information to code with codable children: Secondary | ICD-10-CM

## 2014-09-25 DIAGNOSIS — Z79899 Other long term (current) drug therapy: Secondary | ICD-10-CM | POA: Diagnosis not present

## 2014-09-25 DIAGNOSIS — I10 Essential (primary) hypertension: Secondary | ICD-10-CM | POA: Diagnosis not present

## 2014-09-25 DIAGNOSIS — E11319 Type 2 diabetes mellitus with unspecified diabetic retinopathy without macular edema: Secondary | ICD-10-CM | POA: Diagnosis not present

## 2014-09-25 DIAGNOSIS — E1165 Type 2 diabetes mellitus with hyperglycemia: Secondary | ICD-10-CM | POA: Diagnosis not present

## 2014-09-25 DIAGNOSIS — N184 Chronic kidney disease, stage 4 (severe): Secondary | ICD-10-CM | POA: Diagnosis not present

## 2014-09-25 LAB — LIPID PANEL
CHOL/HDL RATIO: 2.7 ratio (ref <=5.0)
Cholesterol: 185 mg/dL (ref 125–200)
HDL: 68 mg/dL (ref >=40)
LDL Cholesterol: 101 mg/dL (ref <130)
Triglycerides: 80 mg/dL (ref <150)
VLDL: 16 mg/dL (ref <30)

## 2014-09-25 LAB — CBC WITH DIFFERENTIAL/PLATELET
BASOS ABS: 0.1 10*3/uL (ref 0.0–0.1)
Basophils Relative: 1 % (ref 0–1)
EOS PCT: 2 % (ref 0–5)
Eosinophils Absolute: 0.1 10*3/uL (ref 0.0–0.7)
HEMATOCRIT: 40.5 % (ref 39.0–52.0)
Hemoglobin: 13.3 g/dL (ref 13.0–17.0)
LYMPHS PCT: 16 % (ref 12–46)
Lymphs Abs: 1.1 10*3/uL (ref 0.7–4.0)
MCH: 29.8 pg (ref 26.0–34.0)
MCHC: 32.8 g/dL (ref 30.0–36.0)
MCV: 90.8 fL (ref 78.0–100.0)
MPV: 8.8 fL (ref 8.6–12.4)
Monocytes Absolute: 1.1 10*3/uL — ABNORMAL HIGH (ref 0.1–1.0)
Monocytes Relative: 16 % — ABNORMAL HIGH (ref 3–12)
NEUTROS PCT: 65 % (ref 43–77)
Neutro Abs: 4.3 10*3/uL (ref 1.7–7.7)
Platelets: 218 10*3/uL (ref 150–400)
RBC: 4.46 MIL/uL (ref 4.22–5.81)
RDW: 15.1 % (ref 11.5–15.5)
WBC: 6.6 10*3/uL (ref 4.0–10.5)

## 2014-09-25 LAB — COMPLETE METABOLIC PANEL WITH GFR
ALBUMIN: 3.5 g/dL — AB (ref 3.6–5.1)
ALK PHOS: 67 U/L (ref 40–115)
ALT: 6 U/L — AB (ref 9–46)
AST: 11 U/L (ref 10–35)
BUN: 80 mg/dL — AB (ref 7–25)
CALCIUM: 8.8 mg/dL (ref 8.6–10.3)
CO2: 26 mmol/L (ref 20–31)
CREATININE: 4.9 mg/dL — AB (ref 0.70–1.18)
Chloride: 98 mmol/L (ref 98–110)
GFR, Est African American: 13 mL/min — ABNORMAL LOW (ref >=60)
GFR, Est Non African American: 11 mL/min — ABNORMAL LOW (ref >=60)
GLUCOSE: 62 mg/dL — AB (ref 70–99)
POTASSIUM: 3.4 mmol/L — AB (ref 3.5–5.3)
SODIUM: 138 mmol/L (ref 135–146)
TOTAL PROTEIN: 5.7 g/dL — AB (ref 6.1–8.1)
Total Bilirubin: 0.5 mg/dL (ref 0.2–1.2)

## 2014-09-26 LAB — HEMOGLOBIN A1C
Hgb A1c MFr Bld: 6 % — ABNORMAL HIGH (ref <5.7)
Mean Plasma Glucose: 126 mg/dL — ABNORMAL HIGH (ref <117)

## 2014-09-29 ENCOUNTER — Ambulatory Visit: Payer: Medicare Other | Admitting: Family Medicine

## 2014-09-29 ENCOUNTER — Encounter (HOSPITAL_COMMUNITY): Payer: Self-pay

## 2014-09-29 ENCOUNTER — Encounter (HOSPITAL_COMMUNITY)
Admission: RE | Admit: 2014-09-29 | Discharge: 2014-09-29 | Disposition: A | Discharge status: Home / Self Care | Payer: Medicare Other | Source: Ambulatory Visit | Attending: Nephrology | Admitting: Nephrology

## 2014-09-29 DIAGNOSIS — N184 Chronic kidney disease, stage 4 (severe): Secondary | ICD-10-CM | POA: Diagnosis not present

## 2014-09-29 DIAGNOSIS — D638 Anemia in other chronic diseases classified elsewhere: Secondary | ICD-10-CM | POA: Diagnosis not present

## 2014-09-29 LAB — HEMOGLOBIN AND HEMATOCRIT, BLOOD
HEMATOCRIT: 39 % (ref 39.0–52.0)
HEMOGLOBIN: 13.1 g/dL (ref 13.0–17.0)

## 2014-09-29 MED ORDER — DARBEPOETIN ALFA 100 MCG/0.5ML IJ SOSY: 100.0000 ug | PREFILLED_SYRINGE | Freq: Once | INTRAMUSCULAR | Status: DC

## 2014-09-29 NOTE — Progress Notes (Signed)
Results for ANOOP, HEMMER (MRN [id number]) as of 09/29/2014 08:23  Labs drawn today, no med required this am. Next appointment 10/27/2014 @ 0800   Ref. Range 09/29/2014 07:54  Hemoglobin Latest Ref Range: 13.0-17.0 g/dL 13.1  HCT Latest Ref Range: 39.0-52.0 % 39.0

## 2014-09-30 ENCOUNTER — Ambulatory Visit (INDEPENDENT_AMBULATORY_CARE_PROVIDER_SITE_OTHER): Payer: Medicare Other | Admitting: Family Medicine

## 2014-09-30 ENCOUNTER — Encounter: Payer: Self-pay | Admitting: Family Medicine

## 2014-09-30 VITALS — BP 160/60 | HR 72 | Temp 98.1°F | Resp 18 | Ht 71.0 in | Wt 198.0 lb

## 2014-09-30 DIAGNOSIS — E119 Type 2 diabetes mellitus without complications: Secondary | ICD-10-CM

## 2014-09-30 DIAGNOSIS — I5020 Unspecified systolic (congestive) heart failure: Secondary | ICD-10-CM | POA: Diagnosis not present

## 2014-09-30 DIAGNOSIS — Z794 Long term (current) use of insulin: Secondary | ICD-10-CM | POA: Diagnosis not present

## 2014-09-30 DIAGNOSIS — Z23 Encounter for immunization: Secondary | ICD-10-CM

## 2014-09-30 DIAGNOSIS — I10 Essential (primary) hypertension: Secondary | ICD-10-CM | POA: Diagnosis not present

## 2014-09-30 DIAGNOSIS — N184 Chronic kidney disease, stage 4 (severe): Secondary | ICD-10-CM

## 2014-09-30 NOTE — Progress Notes (Signed)
Subjective:    Patient ID: [name], male    DOB: 06/04/1945, 69 y.o.   MRN: [id number]  HPI  05/23/14 Patient is here today for follow-up. Patient looks extremely weak.  Patient has lost 10 pounds since March. He looks dehydrated. In the hospital, the patient perked up when he was given IV fluids. There was no evidence of acidosis and his potassium was stable and therefore there was no indication for emergent dialysis. However the patient now reports nausea. He reports inability to tolerate by mouth. He reports profound weakness. He is here today for hospital follow-up.  At that time, my plan was: I spent 30 minutes with the patient discussing his medical comorbidities. I believe this is a perfect storm of his ongoing medical problems. I believe his kidney failure is worsening. Although he has no indications for emergent dialysis, I believe the symptoms are a sign that the patient is becoming uremic and would benefit from hemodialysis. Coupled with severe stage IV heart failure, I believe this is a completely logical explanation for his profound fatigue, nausea, early satiety, and weakness. I see very little that I can offer in the way to help this patient. However I do think the patient would benefit from a decreased dose of his diuretic. He is clinically dehydrated today. I believe this is exacerbating his weakness and likely contributing to his fatigue. Therefore I recommended decreasing the dose of his torsemide to 50 mg twice a day and that he discontinue metolazone. He is scheduled to follow-up with his nephrologist the first of next week. I recommended that he strongly consider beginning hemodialysis.  06/23/14 Patient is here for CPE.  [name] weight is back up. He appears much stronger today. He appears euvolemic. He went back to his previous dose of his diaphoretic. He has seen his nephrologist and at the present time there is no indication to proceed with dialysis. Unfortunately this  morning he developed a gout flare. He has severe pain in his left ankle. Patient discontinued uloric due to cost. This seemed to contribute to his recent gout flare. However he cannot afford the dictation and allopurinol did not work for him in the past. He has seen the cardiologist and they have decided not to proceed with a ICD.  He has not taken any of his medication in more than 24 hours which explains his blood pressure. AT that time, my plan was: Resume blood pressure medication immediately. Obtain fasting lab work including a CBC, CMP, fasting lipid panel, and PSA. Patient would like to defer the rectal exam today because of the pain in his hand with his ankle. His colonoscopy was in 2013 and is not yet due. Patient has had Pneumovax 23, Prevnar 13, his flu shot, and his tetanus vaccine. Therefore immunizations are up-to-date. I'll give the patient colchicine 0.6 mg tabs for his gout attack. He can take 2 tablets immediately and then 1 additional tablet in 2 hours if the pain persists.  09/30/14 He is here today for follow-up. He is here today mainly for follow-up of his diabetes. He is due for a flu shot. His hemoglobin A1c is outstanding at 6.0. Does have occasional hypoglycemic episodes. He does not take a standard dose of insulin but instead takes anywhere from 10-15 units at night based upon his blood sugar. His blood pressure today is extremely high 160/60. However he has not taken his medication and he did eat a country ham biscuit this morning. He states his  blood pressure at home is better. 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 He also has a history of severe congestive heart failure with an ejection fraction of 20-25%. He would potentially be a good candidate for spironolactone given these 2 reasons to help control his blood pressure although I'll be very cautious and worried about hyperkalemia should restart that medication. He also has a history of dyslipidemia. However  his LDL cholesterol is acceptable at 101 and his HDL cholesterol is outstanding at 68. Hospital Outpatient Visit on 09/29/2014  Component Date Value Ref Range Status  . Hemoglobin 09/29/2014 13.1  13.0 - 17.0 g/dL Final  . HCT 09/29/2014 39.0  39.0 - 52.0 % Final  Lab on 09/25/2014  Component Date Value Ref Range Status  . Sodium 09/25/2014 138  135 - 146 mmol/L Final  . Potassium 09/25/2014 3.4* 3.5 - 5.3 mmol/L Final  . Chloride 09/25/2014 98  98 - 110 mmol/L Final  . CO2 09/25/2014 26  20 - 31 mmol/L Final  . Glucose, Bld 09/25/2014 62* 70 - 99 mg/dL Final  . BUN 09/25/2014 80* 7 - 25 mg/dL Final  . Creat 09/25/2014 4.90* 0.70 - 1.18 mg/dL Final  . Total Bilirubin 09/25/2014 0.5  0.2 - 1.2 mg/dL Final  . Alkaline Phosphatase 09/25/2014 67  40 - 115 U/L Final  . AST 09/25/2014 11  10 - 35 U/L Final  . ALT 09/25/2014 6* 9 - 46 U/L Final  . Total Protein 09/25/2014 5.7* 6.1 - 8.1 g/dL Final  . Albumin 09/25/2014 3.5* 3.6 - 5.1 g/dL Final  . Calcium 09/25/2014 8.8  8.6 - 10.3 mg/dL Final  . GFR, Est African American 09/25/2014 13* >=60 mL/min Final  . GFR, Est Non African American 09/25/2014 11* >=60 mL/min Final   Comment:   The estimated GFR is a calculation valid for adults (>=2 years old) that uses the CKD-EPI algorithm to adjust for age and sex. It is   not to be used for children, pregnant women, hospitalized patients,    patients on dialysis, or with rapidly changing kidney function. According to the NKDEP, eGFR >89 is normal, 60-89 shows mild impairment, 30-59 shows moderate impairment, 15-29 shows severe impairment and <15 is ESRD.     Marland Kitchen Cholesterol 09/25/2014 185  125 - 200 mg/dL Final  . Triglycerides 09/25/2014 80  <150 mg/dL Final  . HDL 09/25/2014 68  >=40 mg/dL Final  . Total CHOL/HDL Ratio 09/25/2014 2.7  <=5.0 Ratio Final  . VLDL 09/25/2014 16  <30 mg/dL Final  . LDL Cholesterol 09/25/2014 101  <130 mg/dL Final   Comment:   Total Cholesterol/HDL Ratio:CHD  Risk                        Coronary Heart Disease Risk Table                                        Men       Women          1/2 Average Risk              3.4        3.3              Average Risk              5.0  4.4           2X Average Risk              9.6        7.1           3X Average Risk             23.4       11.0 Use the calculated Patient Ratio above and the CHD Risk table  to determine the patient's CHD Risk.   . WBC 09/25/2014 6.6  4.0 - 10.5 K/uL Final  . RBC 09/25/2014 4.46  4.22 - 5.81 MIL/uL Final  . Hemoglobin 09/25/2014 13.3  13.0 - 17.0 g/dL Final  . HCT 09/25/2014 40.5  39.0 - 52.0 % Final  . MCV 09/25/2014 90.8  78.0 - 100.0 fL Final  . MCH 09/25/2014 29.8  26.0 - 34.0 pg Final  . MCHC 09/25/2014 32.8  30.0 - 36.0 g/dL Final  . RDW 09/25/2014 15.1  11.5 - 15.5 % Final  . Platelets 09/25/2014 218  150 - 400 K/uL Final  . MPV 09/25/2014 8.8  8.6 - 12.4 fL Final  . Neutrophils Relative % 09/25/2014 65  43 - 77 % Final  . Neutro Abs 09/25/2014 4.3  1.7 - 7.7 K/uL Final  . Lymphocytes Relative 09/25/2014 16  12 - 46 % Final  . Lymphs Abs 09/25/2014 1.1  0.7 - 4.0 K/uL Final  . Monocytes Relative 09/25/2014 16* 3 - 12 % Final  . Monocytes Absolute 09/25/2014 1.1* 0.1 - 1.0 K/uL Final  . Eosinophils Relative 09/25/2014 2  0 - 5 % Final  . Eosinophils Absolute 09/25/2014 0.1  0.0 - 0.7 K/uL Final  . Basophils Relative 09/25/2014 1  0 - 1 % Final  . Basophils Absolute 09/25/2014 0.1  0.0 - 0.1 K/uL Final  . Smear Review 09/25/2014 Criteria for review not met   Final  . Hgb A1c MFr Bld 09/25/2014 6.0* <5.7 % Final   Comment:                                                                        According to the ADA Clinical Practice Recommendations for 2011, when HbA1c is used as a screening test:     >=6.5%   Diagnostic of Diabetes Mellitus            (if abnormal result is confirmed)   5.7-6.4%   Increased risk of developing Diabetes Mellitus    References:Diagnosis and Classification of Diabetes Mellitus,Diabetes TTSV,7793,90(ZESPQ 1):S62-S69 and Standards of Medical Care in         Diabetes - 2011,Diabetes ZRAQ,7622,63 (Suppl 1):S11-S61.     . Mean Plasma Glucose 09/25/2014 126* <117 mg/dL Final   Comment:   Footnotes:  (1) ** Please note change in unit of measure and reference range(s). [facility] Outpatient Visit on 09/08/2014  Component Date Value Ref Range Status  . Hemoglobin 09/08/2014 11.4* 13.0 - 17.0 g/dL Final  . HCT 09/08/2014 34.0* 39.0 - 52.0 % Final    Past Medical History  Diagnosis Date  . Chronic combined systolic and diastolic CHF (congestive heart failure)     a. 02/2014 Echo: EF 20-25% (new),  Gr 2 DD, mod conc LVH, mild AI/AS, mod MR, sev dil LA, mild TR.  Marland Kitchen Nonischemic cardiomyopathy     a. 02/2014 Echo: EF 20-25%;  b. 03/2014 Cath: LM nl, LAD min irregs, LCX   . 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 Hypertension   . Chronic kidney disease (CKD), stage IV (severe) 12/2009    a. baseline creat now ~ 4.  . Erectile dysfunction   . Colonic polyp   . Low serum testosterone level   . Tobacco abuse   . Sleep apnea 2010    Initially unable to afford CPAP  . Benign prostatic hypertrophy   . Obesity, Class I, BMI 30-34.9   . Hyperlipidemia   . Congestive heart failure (CHF)     EF 25-30% 07/2011  . Adenomatous colon polyp 2006  . H. pylori infection 2013    treated with prevpac  . Mild Ao Stenosis and Insufficiency    Past Surgical History  Procedure Laterality Date  . Retinal laser procedure      diabetic retinopathy  . Lymph node biopsy      surgical exploration of neck-not entirely clear that this represented a lymph node biopsy  . Colonoscopy  08/23/2004    Dr. Vivi Ferns rectum, diminutive polyp of the rectosigmoid removed, inflamed focally adenomatous polyp  . Left and right heart catheterization with coronary angiogram N/A 03/19/2014    Procedure: LEFT  AND RIGHT HEART CATHETERIZATION WITH CORONARY ANGIOGRAM;  Surgeon: Blane Ohara, MD;  Location: [facility] CATH LAB;  Service: Cardiovascular;  Laterality: N/A;   Current Outpatient Prescriptions on File Prior to Visit  Medication Sig Dispense Refill  . allopurinol (ZYLOPRIM) 100 MG tablet Take 100 mg by mouth daily.    Marland Kitchen aspirin EC 81 MG tablet Take 81 mg by mouth daily.    . carvedilol (COREG) 12.5 MG tablet Take 1 tablet (12.5 mg total) by mouth 2 (two) times daily with a meal. 180 tablet 3  . cloNIDine (CATAPRES) 0.3 MG tablet Take 0.3 mg by mouth 2 (two) times daily.     . colchicine 0.6 MG tablet Take 2 tabs at first sign of gout and repeat in 1 hour if pain persists 30 tablet 0  . hydrALAZINE (APRESOLINE) 100 MG tablet Take 100 mg by mouth 2 (two) times daily.     . Insulin Glargine (LANTUS SOLOSTAR) 100 UNIT/ML Solostar Pen Inject 30 Units into the skin daily at 10 pm. 5 pen 3  . metolazone (ZAROXOLYN) 2.5 MG tablet 2.5 mg Daily x 3 days and then 2.5 mg every Monday, Wednesday, and Friday. 30 tablet 3  . ONE TOUCH ULTRA TEST test strip     . potassium chloride SA (K-DUR,KLOR-CON) 20 MEQ tablet Take 1 tablet (20 mEq total) by mouth 3 (three) times daily. 90 tablet 3  . torsemide (DEMADEX) 100 MG tablet Take 100 mg by mouth 2 (two) times daily.      No current facility-administered medications on file prior to visit.   Allergies  Allergen Reactions  . Ace Inhibitors Cough   Social History   Social History  . Marital Status: Married    Spouse Name: N/A  . Number of Children: 4  . Years of Education: N/A   Occupational History  . Semi - Retired Administrator    Social History Main Topics  . Smoking status: Current Every Day Smoker -- 0.25 packs/day for 45 years    Types:  Cigarettes    Start date: 05/31/1958  . Smokeless tobacco: Current User     Comment: 1-2 cigarettes OF THE NEW ELECTRIC CIGARETTES  . Alcohol Use: 2.4 oz/week    2 Glasses of wine, 2 Shots of liquor per week      Comment: occ  . Drug Use: No  . Sexual Activity: Not on file   Other Topics Concern  . Not on file   Social History Narrative   Family History  Problem Relation Age of Onset  . Hypertension Mother   . Cancer Mother   . Cancer Father   . Diabetes Sister      Review of Systems  All other systems reviewed and are negative.      Objective:   Physical Exam  Constitutional: He is oriented to person, place, and time. He appears well-developed and well-nourished.  Non-toxic appearance. He does not have a sickly appearance. He does not appear ill. No distress.  HENT:  Head: Normocephalic and atraumatic.  Right Ear: External ear normal.  Left Ear: External ear normal.  Nose: Nose normal.  Mouth/Throat: Oropharynx is clear and moist. No oropharyngeal exudate.  Eyes: Conjunctivae and EOM are normal. Pupils are equal, round, and reactive to light. Right eye exhibits no discharge. Left eye exhibits no discharge. No scleral icterus.  Neck: Normal range of motion. Neck supple. No JVD present. No tracheal deviation present. No thyromegaly present.  Cardiovascular: Normal rate, regular rhythm, normal heart sounds and intact distal pulses.  Exam reveals no gallop and no friction rub.   No murmur heard. Pulmonary/Chest: Effort normal and breath sounds normal. No stridor. No respiratory distress. He has no wheezes. He has no rales. He exhibits no tenderness.  Abdominal: Soft. Bowel sounds are normal. He exhibits no distension and no mass. There is no tenderness. There is no rebound and no guarding.  Genitourinary: Guaiac negative stool.  Musculoskeletal: He exhibits no edema.       Left ankle: He exhibits decreased range of motion and swelling. Tenderness.  Lymphadenopathy:    He has no cervical adenopathy.  Neurological: He is alert and oriented to person, place, and time. He has normal reflexes. No cranial nerve deficit. He exhibits normal muscle tone. Coordination normal.  Skin: Skin is  warm. No rash noted. He is not diaphoretic. No erythema. No pallor.  Psychiatric: He has a normal mood and affect. His behavior is normal. Judgment and thought content normal.  Vitals reviewed.         Assessment & Plan:  Need for prophylactic vaccination and inoculation against influenza - Plan: Flu Vaccine QUAD 36+ mos IM  Diabetes mellitus, type II, insulin dependent  Essential hypertension  CHF (congestive heart failure), NYHA class IV, unspecified failure chronicity, systolic  Chronic kidney disease (CKD), stage IV (severe)  [name] kidney function is stable. However I emphasized that we need to keep his blood pressure under control to prevent deterioration his kidney function. I've asked the patient to check his blood pressure everyday and notify me of the values in one week. If consistently greater than 140/90, I will start the patient on low-dose spironolactone as I am suspicious the patient may have Conn syndrome. We will need to monitor his potassium closely and discontinue his potassium supplement. I will see the patient is very concerned about doing this given his kidney function and therefore he would like to check his blood pressures at home first. His diabetes is well controlled. I recommended that he schedule back  on his insulin and ovoid hypoglycemia and decrease his total insulin dose per day to less than 10 units. Patient received his flu shot today

## 2014-10-17 ENCOUNTER — Ambulatory Visit (INDEPENDENT_AMBULATORY_CARE_PROVIDER_SITE_OTHER): Payer: Medicare Other | Admitting: Cardiovascular Disease

## 2014-10-17 ENCOUNTER — Encounter: Payer: Self-pay | Admitting: Cardiovascular Disease

## 2014-10-17 VITALS — BP 130/64 | HR 74 | Ht 69.0 in | Wt 198.0 lb

## 2014-10-17 DIAGNOSIS — I428 Other cardiomyopathies: Secondary | ICD-10-CM

## 2014-10-17 DIAGNOSIS — N184 Chronic kidney disease, stage 4 (severe): Secondary | ICD-10-CM

## 2014-10-17 DIAGNOSIS — I10 Essential (primary) hypertension: Secondary | ICD-10-CM | POA: Diagnosis not present

## 2014-10-17 DIAGNOSIS — I350 Nonrheumatic aortic (valve) stenosis: Secondary | ICD-10-CM

## 2014-10-17 DIAGNOSIS — I5022 Chronic systolic (congestive) heart failure: Secondary | ICD-10-CM | POA: Diagnosis not present

## 2014-10-17 DIAGNOSIS — I359 Nonrheumatic aortic valve disorder, unspecified: Secondary | ICD-10-CM | POA: Diagnosis not present

## 2014-10-17 DIAGNOSIS — E785 Hyperlipidemia, unspecified: Secondary | ICD-10-CM

## 2014-10-17 DIAGNOSIS — I351 Nonrheumatic aortic (valve) insufficiency: Secondary | ICD-10-CM

## 2014-10-17 DIAGNOSIS — I429 Cardiomyopathy, unspecified: Secondary | ICD-10-CM

## 2014-10-17 DIAGNOSIS — I519 Heart disease, unspecified: Secondary | ICD-10-CM

## 2014-10-17 DIAGNOSIS — I340 Nonrheumatic mitral (valve) insufficiency: Secondary | ICD-10-CM

## 2014-10-17 DIAGNOSIS — Z716 Tobacco abuse counseling: Secondary | ICD-10-CM

## 2014-10-17 NOTE — Progress Notes (Signed)
Patient ID: ARISTIDE WAGGLE, male   DOB: 12/27/1945, 69 y.o.   MRN: [id number]      SUBJECTIVE: The patient presents for routine cardiovascular follow-up. He has a history of a nonischemic cardiomyopathy with severe left ventricular systolic dysfunction and chronic systolic heart failure, along with stage IV chronic kidney disease and essential hypertension.  Echo also showed grade II diastolic dysfunction, mild aortic regurgitation and mild aortic stenosis with moderate mitral regurgitation and severe left atrial dilatation. Right ventricular systolic function was mildly reduced with mild right atrial dilatation, a PFO, and mild tricuspid regurgitation. He has a left bundle branch block.  He has decided to hold off on ICD placement.  He denies chest pain, palpitations, leg swelling, dizziness, and shortness of breath. Weight has been stable at home, fluctuating between 195-200 lbs.  Had more flares of gout with hydralazine, but prefers to keep taking it.   Review of Systems: As per "subjective", otherwise negative.  Allergies  Allergen Reactions  . Ace Inhibitors Cough    Current Outpatient Prescriptions  Medication Sig Dispense Refill  . aspirin EC 81 MG tablet Take 81 mg by mouth daily.    . carvedilol (COREG) 12.5 MG tablet Take 1 tablet (12.5 mg total) by mouth 2 (two) times daily with a meal. 180 tablet 3  . colchicine 0.6 MG tablet Take 2 tabs at first sign of gout and repeat in 1 hour if pain persists 30 tablet 0  . hydrALAZINE (APRESOLINE) 100 MG tablet Take 100 mg by mouth 2 (two) times daily.     . Insulin Glargine (LANTUS SOLOSTAR) 100 UNIT/ML Solostar Pen Inject 30 Units into the skin daily at 10 pm. 5 pen 3  . metolazone (ZAROXOLYN) 2.5 MG tablet 2.5 mg Daily x 3 days and then 2.5 mg every Monday, Wednesday, and Friday. 30 tablet 3  . ONE TOUCH ULTRA TEST test strip     . potassium chloride SA (K-DUR,KLOR-CON) 20 MEQ tablet Take 1 tablet (20 mEq total) by mouth 3 (three)  times daily. 90 tablet 3  . torsemide (DEMADEX) 100 MG tablet Take 100 mg by mouth 2 (two) times daily.      No current facility-administered medications for this visit.    Past Medical History  Diagnosis Date  . Chronic combined systolic and diastolic CHF (congestive heart failure)     a. 02/2014 Echo: EF 20-25% (new), Gr 2 DD, mod conc LVH, mild AI/AS, mod MR, sev dil LA, mild TR.  Marland Kitchen Nonischemic cardiomyopathy     a. 02/2014 Echo: EF 20-25%;  b. 03/2014 Cath: LM nl, LAD min irregs, LCX   . 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 Hypertension   . Chronic kidney disease (CKD), stage IV (severe) 12/2009    a. baseline creat now ~ 4.  . Erectile dysfunction   . Colonic polyp   . Low serum testosterone level   . Tobacco abuse   . Sleep apnea 2010    Initially unable to afford CPAP  . Benign prostatic hypertrophy   . Obesity, Class I, BMI 30-34.9   . Hyperlipidemia   . Congestive heart failure (CHF)     EF 25-30% 07/2011  . Adenomatous colon polyp 2006  . H. pylori infection 2013    treated with prevpac  . Mild Ao Stenosis and Insufficiency     Past Surgical History  Procedure Laterality Date  . Retinal laser procedure  diabetic retinopathy  . Lymph node biopsy      surgical exploration of neck-not entirely clear that this represented a lymph node biopsy  . Colonoscopy  08/23/2004    Dr. Vivi Ferns rectum, diminutive polyp of the rectosigmoid removed, inflamed focally adenomatous polyp  . Left and right heart catheterization with coronary angiogram N/A 03/19/2014    Procedure: LEFT AND RIGHT HEART CATHETERIZATION WITH CORONARY ANGIOGRAM;  Surgeon: Blane Ohara, MD;  Location: [facility] CATH LAB;  Service: Cardiovascular;  Laterality: N/A;    Social History   Social History  . Marital Status: Married    Spouse Name: N/A  . Number of Children: 4  . Years of Education: N/A   Occupational History  . Semi - Retired Administrator    Social History  Main Topics  . Smoking status: Current Every Day Smoker -- 0.25 packs/day for 45 years    Types: Cigarettes    Start date: 05/31/1958  . Smokeless tobacco: Current User     Comment: 1-2 cigarettes OF THE NEW ELECTRIC CIGARETTES  . Alcohol Use: 2.4 oz/week    2 Glasses of wine, 2 Shots of liquor per week     Comment: occ  . Drug Use: No  . Sexual Activity: Not on file   Other Topics Concern  . Not on file   Social History Narrative     Filed Vitals:   10/17/14 1049  BP: 130/64  Pulse: 74  Height: 5\' 9"  (1.753 m)  Weight: 198 lb (89.812 kg)  SpO2: 96%    PHYSICAL EXAM General: NAD HEENT: Normal. Neck: No JVD, no thyromegaly. Lungs: Clear to auscultation bilaterally with normal respiratory effort. CV: Nondisplaced PMI. Regular rate and rhythm, normal S1/S2, no S3/S4, II/VI ejection systolic murmur over RUSB and I/IV holodiastolic murmur along left sternal border. No pretibial or periankle edema. Abdomen: Soft, nontender, no hepatosplenomegaly, no distention.  Neurologic: Alert and oriented x 3.  Psych: Somewhat flat affect. Skin: Normal. Musculoskeletal: No gross deformities. Extremities: No clubbing or cyanosis.   ECG: Most recent ECG reviewed.      ASSESSMENT AND PLAN: 1. Chronic systolic heart failure/nonischemic cardiomyopathy with severely reduced LV systolic function, EF 53-64%: Euvolemic today, but with significant decline in LV systolic function which had normalized in 06/2012. Continue torsemide 100 mg bid (evening dose at 4 pm to help with sleep) and metolazone as directed by nephrology (three days per week). He is not a candidate for spironolactone or ACEI/ARB due to severe CKD. He is on hydralazine but nitrates caused headaches. Continue ASA and carvedilol.  Decided not to pursue ICD.  2. Essential HTN: Well controlled. No changes.  3. Valvular heart disease with moderate mitral regurgitation and mild aortic stenosis and regurgitation: Symptomatically  stable. Medications noted above.  4. CKD stage 4: BUN 80, creat 4.9 on 09/25/14. Followed by nephrology.   5. Tobacco abuse disorder: Does not want to quit as it helps with anxiety.  Dispo: f/u 4 months.   Kate Sable, M.D., F.A.C.C.

## 2014-10-17 NOTE — Patient Instructions (Signed)
Your physician wants you to follow-up in: 4 months with Dr Virgina Jock will receive a reminder letter in the mail two months in advance. If you don't receive a letter, please call our office to schedule the follow-up appointment.    Your physician recommends that you continue on your current medications as directed. Please refer to the Current Medication list given to you today.      Thank you for choosing [facility] !

## 2014-10-27 ENCOUNTER — Encounter (HOSPITAL_COMMUNITY)
Admission: RE | Admit: 2014-10-27 | Discharge: 2014-10-27 | Disposition: A | Discharge status: Home / Self Care | Payer: Medicare Other | Source: Ambulatory Visit | Attending: Nephrology | Admitting: Nephrology

## 2014-10-27 ENCOUNTER — Encounter (HOSPITAL_COMMUNITY): Payer: Self-pay

## 2014-10-27 DIAGNOSIS — N184 Chronic kidney disease, stage 4 (severe): Secondary | ICD-10-CM | POA: Diagnosis not present

## 2014-10-27 DIAGNOSIS — D638 Anemia in other chronic diseases classified elsewhere: Secondary | ICD-10-CM | POA: Insufficient documentation

## 2014-10-27 LAB — HEMOGLOBIN AND HEMATOCRIT, BLOOD
HCT: 35.6 % — ABNORMAL LOW (ref 39.0–52.0)
Hemoglobin: 12.1 g/dL — ABNORMAL LOW (ref 13.0–17.0)

## 2014-10-27 NOTE — Progress Notes (Signed)
Results for SHERIF, MILLSPAUGH (MRN [id number]) as of 10/27/2014 09:31 Pt arrived today for labs and possible injection. No aranesp required today due to order. Next appointment and will need renewal order 11/24/2014  Ref. Range 10/27/2014 07:52  Hemoglobin Latest Ref Range: 13.0-17.0 g/dL 12.1 (L)  HCT Latest Ref Range: 39.0-52.0 % 35.6 (L)

## 2014-11-06 ENCOUNTER — Ambulatory Visit (INDEPENDENT_AMBULATORY_CARE_PROVIDER_SITE_OTHER): Payer: Medicare Other | Admitting: Family Medicine

## 2014-11-06 ENCOUNTER — Telehealth: Payer: Self-pay | Admitting: Cardiovascular Disease

## 2014-11-06 ENCOUNTER — Encounter: Payer: Self-pay | Admitting: Family Medicine

## 2014-11-06 ENCOUNTER — Encounter: Payer: Self-pay | Admitting: Internal Medicine

## 2014-11-06 VITALS — BP 130/20 | HR 70 | Temp 98.2°F | Resp 14 | Ht 71.0 in | Wt 193.0 lb

## 2014-11-06 DIAGNOSIS — R002 Palpitations: Secondary | ICD-10-CM | POA: Diagnosis not present

## 2014-11-06 DIAGNOSIS — R531 Weakness: Secondary | ICD-10-CM

## 2014-11-06 MED ORDER — METOPROLOL SUCCINATE ER 50 MG PO TB24: ORAL_TABLET | ORAL | Status: DC

## 2014-11-06 NOTE — Telephone Encounter (Signed)
Switch to Toprol-XL 25 mg day to see if he tolerates this. This medication is also beneficial for his cardiomyopathy.

## 2014-11-06 NOTE — Telephone Encounter (Signed)
Pt states that he thinks that his coreg is causing him extreme fatigue,slowed breathing & to feel "just bad all way around." He wants to know if he can try another medication as he has tried to fight the side effects, but id staying to tired.

## 2014-11-06 NOTE — Telephone Encounter (Signed)
Called pt and explained the medication change. He voiced understanding. Will call back to let us know how the Toprol XL 25 mg daily works for him.

## 2014-11-06 NOTE — Progress Notes (Signed)
Subjective:    Patient ID: [name], male    DOB: 06/04/1945, 69 y.o.   MRN: [id number]  HPI  05/23/14 Patient is here today for follow-up. Patient looks extremely weak.  Patient has lost 10 pounds since March. He looks dehydrated. In the hospital, the patient perked up when he was given IV fluids. There was no evidence of acidosis and his potassium was stable and therefore there was no indication for emergent dialysis. However the patient now reports nausea. He reports inability to tolerate by mouth. He reports profound weakness. He is here today for hospital follow-up.  At that time, my plan was: I spent 30 minutes with the patient discussing his medical comorbidities. I believe this is a perfect storm of his ongoing medical problems. I believe his kidney failure is worsening. Although he has no indications for emergent dialysis, I believe the symptoms are a sign that the patient is becoming uremic and would benefit from hemodialysis. Coupled with severe stage IV heart failure, I believe this is a completely logical explanation for his profound fatigue, nausea, early satiety, and weakness. I see very little that I can offer in the way to help this patient. However I do think the patient would benefit from a decreased dose of his diuretic. He is clinically dehydrated today. I believe this is exacerbating his weakness and likely contributing to his fatigue. Therefore I recommended decreasing the dose of his torsemide to 50 mg twice a day and that he discontinue metolazone. He is scheduled to follow-up with his nephrologist the first of next week. I recommended that he strongly consider beginning hemodialysis.  06/23/14 Patient is here for CPE.  [name] weight is back up. He appears much stronger today. He appears euvolemic. He went back to his previous dose of his diaphoretic. He has seen his nephrologist and at the present time there is no indication to proceed with dialysis. Unfortunately this  morning he developed a gout flare. He has severe pain in his left ankle. Patient discontinued uloric due to cost. This seemed to contribute to his recent gout flare. However he cannot afford the dictation and allopurinol did not work for him in the past. He has seen the cardiologist and they have decided not to proceed with a ICD.  He has not taken any of his medication in more than 24 hours which explains his blood pressure. AT that time, my plan was: Resume blood pressure medication immediately. Obtain fasting lab work including a CBC, CMP, fasting lipid panel, and PSA. Patient would like to defer the rectal exam today because of the pain in his hand with his ankle. His colonoscopy was in 2013 and is not yet due. Patient has had Pneumovax 23, Prevnar 13, his flu shot, and his tetanus vaccine. Therefore immunizations are up-to-date. I'll give the patient colchicine 0.6 mg tabs for his gout attack. He can take 2 tablets immediately and then 1 additional tablet in 2 hours if the pain persists.  09/30/14 He is here today for follow-up. He is here today mainly for follow-up of his diabetes. He is due for a flu shot. His hemoglobin A1c is outstanding at 6.0. Does have occasional hypoglycemic episodes. He does not take a standard dose of insulin but instead takes anywhere from 10-15 units at night based upon his blood sugar. His blood pressure today is extremely high 160/60. However he has not taken his medication and he did eat a country ham biscuit this morning. He states his  blood pressure at home is better. 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 He also has a history of severe congestive heart failure with an ejection fraction of 20-25%. He would potentially be a good candidate for spironolactone given these 2 reasons to help control his blood pressure although I'll be very cautious and worried about hyperkalemia should restart that medication. He also has a history of dyslipidemia. However  his LDL cholesterol is acceptable at 101 and his HDL cholesterol is outstanding at 68.  At that time, my plan was: [name] kidney function is stable. However I emphasized that we need to keep his blood pressure under control to prevent deterioration his kidney function. I've asked the patient to check his blood pressure everyday and notify me of the values in one week. If consistently greater than 140/90, I will start the patient on low-dose spironolactone as I am suspicious the patient may have Conn syndrome. We will need to monitor his potassium closely and discontinue his potassium supplement. I will see the patient is very concerned about doing this given his kidney function and therefore he would like to check his blood pressures at home first. His diabetes is well controlled. I recommended that he schedule back on his insulin and ovoid hypoglycemia and decrease his total insulin dose per day to less than 10 units. Patient received his flu shot today  11/06/14 Patient plans of weakness and fatigue. First of all he says he is extremely tired all the time area however he admits he is not sleeping well at night because he is constantly waking up to go to the restroom and void. However he also has near drop attacks. He states that frequently will feel extremely weak, he will feel palpitations and chest, he will feel lightheaded almost presyncopal. Sounds like his blood pressures dropping and that his heart is having some kind of arrhythmia. This is concerning given his past medical history. He blames it on the carvedilol, his cardiologist just switched him to Toprol today to see if he tolerates it better. He does complain of fatigue on the carvedilol however I'm concerned by these weak spells that sound like presyncopal episodes. The patient calls these dizzy spells but he denies any vertigo. Hospital Outpatient Visit on 10/27/2014  Component Date Value Ref Range Status  . Hemoglobin 10/27/2014 12.1* 13.0  - 17.0 g/dL Final  . HCT 10/27/2014 35.6* 39.0 - 52.0 % Final    Past Medical History  Diagnosis Date  . Chronic combined systolic and diastolic CHF (congestive heart failure) ([place])     a. 02/2014 Echo: EF 20-25% (new), Gr 2 DD, mod conc LVH, mild AI/AS, mod MR, sev dil LA, mild TR.  Marland Kitchen Nonischemic cardiomyopathy ([place])     a. 02/2014 Echo: EF 20-25%;  b. 03/2014 Cath: LM nl, LAD min irregs, LCX   . 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 Hypertension   . Chronic kidney disease (CKD), stage IV (severe) ([place]) 12/2009    a. baseline creat now ~ 4.  . Erectile dysfunction   . Colonic polyp   . Low serum testosterone level   . Tobacco abuse   . Sleep apnea 2010    Initially unable to afford CPAP  . Benign prostatic hypertrophy   . Obesity, Class I, BMI 30-34.9   . Hyperlipidemia   . Congestive heart failure (CHF) ([place])     EF 25-30% 07/2011  . Adenomatous colon polyp  2006  . H. pylori infection 2013    treated with prevpac  . Mild Ao Stenosis and Insufficiency    Past Surgical History  Procedure Laterality Date  . Retinal laser procedure      diabetic retinopathy  . Lymph node biopsy      surgical exploration of neck-not entirely clear that this represented a lymph node biopsy  . Colonoscopy  08/23/2004    Dr. Vivi Ferns rectum, diminutive polyp of the rectosigmoid removed, inflamed focally adenomatous polyp  . Left and right heart catheterization with coronary angiogram N/A 03/19/2014    Procedure: LEFT AND RIGHT HEART CATHETERIZATION WITH CORONARY ANGIOGRAM;  Surgeon: Blane Ohara, MD;  Location: [facility] CATH LAB;  Service: Cardiovascular;  Laterality: N/A;   Current Outpatient Prescriptions on File Prior to Visit  Medication Sig Dispense Refill  . aspirin EC 81 MG tablet Take 81 mg by mouth daily.    . colchicine 0.6 MG tablet Take 2 tabs at first sign of gout and repeat in 1 hour if pain persists 30 tablet 0  . Darbepoetin Alfa (ARANESP, ALBUMIN FREE,)  100 MCG/ML SOLN Inject 100 mcg as directed every 30 (thirty) days. As needed when HGB is below 12.0    . hydrALAZINE (APRESOLINE) 100 MG tablet Take 100 mg by mouth 2 (two) times daily.     . Insulin Glargine (LANTUS SOLOSTAR) 100 UNIT/ML Solostar Pen Inject 30 Units into the skin daily at 10 pm. 5 pen 3  . metolazone (ZAROXOLYN) 2.5 MG tablet 2.5 mg Daily x 3 days and then 2.5 mg every Monday, Wednesday, and Friday. 30 tablet 3  . ONE TOUCH ULTRA TEST test strip     . potassium chloride SA (K-DUR,KLOR-CON) 20 MEQ tablet Take 1 tablet (20 mEq total) by mouth 3 (three) times daily. 90 tablet 3  . torsemide (DEMADEX) 100 MG tablet Take 100 mg by mouth 2 (two) times daily.      No current facility-administered medications on file prior to visit.   Allergies  Allergen Reactions  . Ace Inhibitors Cough   Social History   Social History  . Marital Status: Married    Spouse Name: N/A  . Number of Children: 4  . Years of Education: N/A   Occupational History  . Semi - Retired Administrator    Social History Main Topics  . Smoking status: Current Every Day Smoker -- 0.25 packs/day for 45 years    Types: Cigarettes    Start date: 05/31/1958  . Smokeless tobacco: Current User     Comment: 1-2 cigarettes OF THE NEW ELECTRIC CIGARETTES  . Alcohol Use: 2.4 oz/week    2 Glasses of wine, 2 Shots of liquor per week     Comment: occ  . Drug Use: No  . Sexual Activity: Not on file   Other Topics Concern  . Not on file   Social History Narrative   Family History  Problem Relation Age of Onset  . Hypertension Mother   . Cancer Mother   . Cancer Father   . Diabetes Sister      Review of Systems  All other systems reviewed and are negative.      Objective:   Physical Exam  Constitutional: He is oriented to person, place, and time. He appears well-developed and well-nourished.  Non-toxic appearance. He does not have a sickly appearance. He does not appear ill. No distress.  HENT:    Head: Normocephalic and atraumatic.  Right Ear: External ear normal.  Left Ear: External ear normal.  Nose: Nose normal.  Mouth/Throat: Oropharynx is clear and moist. No oropharyngeal exudate.  Eyes: Conjunctivae and EOM are normal. Pupils are equal, round, and reactive to light. Right eye exhibits no discharge. Left eye exhibits no discharge. No scleral icterus.  Neck: Normal range of motion. Neck supple. No JVD present. No tracheal deviation present. No thyromegaly present.  Cardiovascular: Normal rate, regular rhythm, normal heart sounds and intact distal pulses.  Exam reveals no gallop and no friction rub.   No murmur heard. Pulmonary/Chest: Effort normal and breath sounds normal. No stridor. No respiratory distress. He has no wheezes. He has no rales. He exhibits no tenderness.  Abdominal: Soft. Bowel sounds are normal. He exhibits no distension and no mass. There is no tenderness. There is no rebound and no guarding.  Genitourinary: Guaiac negative stool.  Musculoskeletal: He exhibits no edema.       Left ankle: He exhibits decreased range of motion and swelling. Tenderness.  Lymphadenopathy:    He has no cervical adenopathy.  Neurological: He is alert and oriented to person, place, and time. He has normal reflexes. No cranial nerve deficit. He exhibits normal muscle tone. Coordination normal.  Skin: Skin is warm. No rash noted. He is not diaphoretic. No erythema. No pallor.  Psychiatric: He has a normal mood and affect. His behavior is normal. Judgment and thought content normal.  Vitals reviewed.         Assessment & Plan:  Weakness generalized  I would like the patient to decrease his torsemide to 50 mg in the afternoon to see if it will cut down on the polyuria at night and hopefully allow him to sleep better. We will need to monitor for fluid overload closely. I'm fine with the patient trying a different beta blocker to see if he can tolerate it better. However I will also  have the patient wear a Holter monitor to see if he is having any cardiac arrhythmias that may be causing these "weak spells".

## 2014-11-06 NOTE — Telephone Encounter (Signed)
Patient states he is having "side effects" from Carvedilol. / tg

## 2014-11-07 ENCOUNTER — Ambulatory Visit (INDEPENDENT_AMBULATORY_CARE_PROVIDER_SITE_OTHER): Payer: Medicare Other | Admitting: Family Medicine

## 2014-11-07 DIAGNOSIS — R531 Weakness: Secondary | ICD-10-CM | POA: Diagnosis not present

## 2014-11-07 DIAGNOSIS — R002 Palpitations: Secondary | ICD-10-CM

## 2014-11-07 NOTE — Progress Notes (Signed)
Pt returned to have Holter Monitor removed.  Denied any problems while monitor was on.  No diary returned.  Monitor to [facility] for evaluation.

## 2014-11-11 ENCOUNTER — Other Ambulatory Visit: Payer: Self-pay

## 2014-11-11 ENCOUNTER — Other Ambulatory Visit: Payer: Self-pay | Admitting: Nurse Practitioner

## 2014-11-11 MED ORDER — METOLAZONE 2.5 MG PO TABS: ORAL_TABLET | ORAL | Status: DC

## 2014-11-11 NOTE — Telephone Encounter (Signed)
Changed rx to every monday,wednesday,friday

## 2014-11-13 ENCOUNTER — Telehealth: Payer: Self-pay | Admitting: Internal Medicine

## 2014-11-13 NOTE — Telephone Encounter (Signed)
Will route to [facility] Dr. Tanna Furry nurse and Lorenda Hatchet to advise on sooner appt.

## 2014-11-13 NOTE — Telephone Encounter (Signed)
New Message     Office calling stating that pt needs an appt as soon as possible for pt to discuss ICD for CHF per Dr. Dennard Schaumann. The first available appt with anyone in EP is Nov 15th and they state this is too far out. Pt needs an appt with Dr. Lovena Le, Joseph Art PA, or Amber PA. Office was notified that EP scheduler and Dr. Tanna Furry nurse are not here and a message will be sent to triage. Notified that they may not be contacted back until tomorrow with an appt seeing as how the two people who are able to double book for these providers are not here.

## 2014-11-14 ENCOUNTER — Telehealth: Payer: Self-pay | Admitting: *Deleted

## 2014-11-14 NOTE — Telephone Encounter (Signed)
Called and spoke with Gabriel Cirri and let her know the patient was seen in May by Dr Lovena Le:   Chronic systolic congestive heart failure - Evans Lance, MD at 06/03/2014 5:28 PM     Status: Written Related Problem: Chronic systolic congestive heart failure   Expand All Collapse All   His symptoms are currently well compensated. He has an indication for ICD implant and we discussed the pro's and con's of both. He does not want to wear his Life Vest any longer and from my perspective I told him he could remove. He has had no symptoms and his Life vest has demonstrated no arrhythmias and he is non-ischemic. He will call us if he would like to proceed with ICD. I think if he does, a SQ device would make the most sense because of his many comorbidities.          She is going to fax me the results of his monitor and we will have him seen again by Dr Lovena Le.  He was seen by Dr Bronson Ing on 10/17/14 and was still not inclined to proceed with ICD(see note).

## 2014-11-14 NOTE — Telephone Encounter (Signed)
Claiborne Billings from [employer] office returned my call on this pt and stated that when pt was seen in 09/26 He has decided to hold off on ICD placement., Claiborne Billings is going to try to schedule pt for the ICD placement, I faxed over the Heart Monitor results to her attention.

## 2014-11-17 ENCOUNTER — Ambulatory Visit (INDEPENDENT_AMBULATORY_CARE_PROVIDER_SITE_OTHER): Payer: Medicare Other | Admitting: Internal Medicine

## 2014-11-17 ENCOUNTER — Encounter: Payer: Self-pay | Admitting: Internal Medicine

## 2014-11-17 VITALS — BP 140/40 | HR 55 | Ht 69.0 in | Wt 192.8 lb

## 2014-11-17 DIAGNOSIS — R002 Palpitations: Secondary | ICD-10-CM

## 2014-11-17 DIAGNOSIS — F17200 Nicotine dependence, unspecified, uncomplicated: Secondary | ICD-10-CM

## 2014-11-17 DIAGNOSIS — I5022 Chronic systolic (congestive) heart failure: Secondary | ICD-10-CM

## 2014-11-17 DIAGNOSIS — I472 Ventricular tachycardia, unspecified: Secondary | ICD-10-CM | POA: Insufficient documentation

## 2014-11-17 NOTE — Progress Notes (Signed)
HPI [name] returns today for reconsideration for insertion of an ICD. He has a longstanding history of severe LV dysfunction dating back to 2010. He had normalization of his EF by echo in 2014. He did well until representing in February/March. He had worsening dyspnea. He currently has class 2 symptoms. A nuclear stress test in mid February demonstrated an EF of 20% with scar and catheterization in March demonstrated no CAD but severe LV dysfunction. He is on maximal medical therapy but cannot take an ACE/ARB due to renal insufficiency. He has stage 5 renal insufficiency and his most recent serum creatinine was 4.5. He has done well from a fluid volume perspective. His QRS duration is about 130 ms. He has not had syncope. Allergies  Allergen Reactions  . Ace Inhibitors Cough     Current Outpatient Prescriptions  Medication Sig Dispense Refill  . aspirin EC 81 MG tablet Take 81 mg by mouth daily.    . colchicine 0.6 MG tablet Take 2 tabs at first sign of gout and repeat in 1 hour if pain persists 30 tablet 0  . Darbepoetin Alfa (ARANESP, ALBUMIN FREE,) 100 MCG/ML SOLN Inject 100 mcg as directed every 30 (thirty) days. As needed when HGB is below 12.0    . hydrALAZINE (APRESOLINE) 100 MG tablet Take 100 mg by mouth 2 (two) times daily.     . Insulin Glargine (LANTUS SOLOSTAR) 100 UNIT/ML Solostar Pen Inject 30 Units into the skin daily at 10 pm. 5 pen 3  . metolazone (ZAROXOLYN) 2.5 MG tablet Take 1 tablet by mouth three times a week as needed for swelling    . metoprolol succinate (TOPROL-XL) 25 MG 24 hr tablet Take 25 mg by mouth daily.    . ONE TOUCH ULTRA TEST test strip     . potassium chloride SA (K-DUR,KLOR-CON) 20 MEQ tablet Take 1 tablet (20 mEq total) by mouth 3 (three) times daily. 90 tablet 3  . torsemide (DEMADEX) 100 MG tablet Take 100 mg by mouth 2 (two) times daily.      No current facility-administered medications for this visit.     Past Medical History    Diagnosis Date  . Chronic combined systolic and diastolic CHF (congestive heart failure) ([place])     a. 02/2014 Echo: EF 20-25% (new), Gr 2 DD, mod conc LVH, mild AI/AS, mod MR, sev dil LA, mild TR.  Marland Kitchen Nonischemic cardiomyopathy ([place])     a. 02/2014 Echo: EF 20-25%;  b. 03/2014 Cath: LM nl, LAD min irregs, LCX   . 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 Hypertension   . Chronic kidney disease (CKD), stage IV (severe) ([place]) 12/2009    a. baseline creat now ~ 4.  . Erectile dysfunction   . Colonic polyp   . Low serum testosterone level   . Tobacco abuse   . Sleep apnea 2010    Initially unable to afford CPAP  . Benign prostatic hypertrophy   . Obesity, Class I, BMI 30-34.9   . Hyperlipidemia   . Congestive heart failure (CHF) ([place])     EF 25-30% 07/2011  . Adenomatous colon polyp 2006  . H. pylori infection 2013    treated with prevpac  . Mild Ao Stenosis and Insufficiency     ROS:   All systems reviewed and negative except as noted in the HPI.   Past Surgical History  Procedure Laterality Date  . Retinal  laser procedure      diabetic retinopathy  . Lymph node biopsy      surgical exploration of neck-not entirely clear that this represented a lymph node biopsy  . Colonoscopy  08/23/2004    Dr. Vivi [name] rectum, diminutive polyp of the rectosigmoid removed, inflamed focally adenomatous polyp  . Left and right heart catheterization with coronary angiogram N/A 03/19/2014    Procedure: LEFT AND RIGHT HEART CATHETERIZATION WITH CORONARY ANGIOGRAM;  Surgeon: Harold Ohara, MD;  Location: [facility] CATH LAB;  Service: Cardiovascular;  Laterality: N/A;     Family History  Problem Relation Age of Onset  . Hypertension Mother   . Cancer Mother   . Cancer Father   . Diabetes Sister      Social History   Social History  . Marital Status: Married    Spouse Name: N/A  . Number of Children: 4  . Years of Education: N/A   Occupational History  . Semi -  Retired Administrator    Social History Main Topics  . Smoking status: Current Every Day Smoker -- 0.25 packs/day for 45 years    Types: Cigarettes    Start date: 05/31/1958  . Smokeless tobacco: Current User     Comment: 1-2 cigarettes OF THE NEW ELECTRIC CIGARETTES  . Alcohol Use: 2.4 oz/week    2 Glasses of wine, 2 Shots of liquor per week     Comment: occ  . Drug Use: No  . Sexual Activity: Not on file   Other Topics Concern  . Not on file   Social History Narrative     BP 140/40 mmHg  Pulse 55  Ht 5\' 9"  (1.753 m)  Wt 192 lb 12.8 oz (87.454 kg)  BMI 28.46 kg/m2  Physical Exam:  Well appearing 69 yo man, NAD HEENT: Unremarkable Neck:  7 cm JVD, no thyromegally Lymphatics:  No adenopathy Back:  No CVA tenderness Lungs:  Clear with no wheezes HEART:  Regular rate rhythm, no murmurs, no rubs, no clicks Abd:  soft, positive bowel sounds, no organomegally, no rebound, no guarding Ext:  2 plus pulses, no edema, no cyanosis, no clubbing Skin:  No rashes no nodules Neuro:  CN II through XII intact, motor grossly intact  EKG - NSR   Assess/Plan:

## 2014-11-17 NOTE — Assessment & Plan Note (Signed)
These appear to be due to VT. Will follow.

## 2014-11-17 NOTE — Assessment & Plan Note (Signed)
He is encouraged to stop smoking. 

## 2014-11-17 NOTE — Assessment & Plan Note (Signed)
He has had long runs of NSVT. We again discussed the implications, pro's and con's of ICD implant. He is considering his options and will call if he decides to proceed with ICD implant.

## 2014-11-17 NOTE — Assessment & Plan Note (Signed)
His symptoms are class 2. He will continue his current meds.  

## 2014-11-17 NOTE — Patient Instructions (Signed)
Medication Instructions:  Your physician recommends that you continue on your current medications as directed. Please refer to the Current Medication list given to you today.   Labwork: None ordered   Testing/Procedures: Your physician has recommended that you have a defibrillator inserted. An implantable cardioverter defibrillator (ICD) is a small device that is placed in your chest or, in rare cases, your abdomen. This device uses electrical pulses or shocks to help control life-threatening, irregular heartbeats that could lead the heart to suddenly stop beating (sudden cardiac arrest). Leads are attached to the ICD that goes into your heart. This is done in the hospital and usually requires an overnight stay. Please see the instruction sheet given to you today for more information.  Dates for implant are 11/8, 11/16, 11/17, 11/22, 11/30, 12/02, 12/6, 12/14, 12/15, 12/22, 12/27  Call [facility] if you want to proceed [phone]  Follow-Up: Your physician recommends that you schedule a follow-up appointment as needed unless you decide to have ICD implant   Any Other Special Instructions Will Be Listed Below (If Applicable).     If you need a refill on your cardiac medications before your next appointment, please call your pharmacy.

## 2014-11-20 ENCOUNTER — Other Ambulatory Visit: Payer: Self-pay | Admitting: *Deleted

## 2014-11-20 DIAGNOSIS — I5022 Chronic systolic (congestive) heart failure: Secondary | ICD-10-CM

## 2014-11-20 MED ORDER — METOLAZONE 2.5 MG PO TABS: ORAL_TABLET | ORAL | Status: DC

## 2014-11-20 NOTE — Telephone Encounter (Signed)
Received fax requesting refill on Zaroxolyn.   Refill appropriate and filled per protocol.

## 2014-11-24 ENCOUNTER — Encounter (HOSPITAL_COMMUNITY): Admission: RE | Admit: 2014-11-24 | Payer: Medicare Other | Source: Ambulatory Visit

## 2014-11-24 ENCOUNTER — Encounter (HOSPITAL_COMMUNITY): Payer: Medicare Other

## 2014-11-28 ENCOUNTER — Encounter: Payer: Self-pay | Admitting: *Deleted

## 2014-11-28 ENCOUNTER — Telehealth: Payer: Self-pay | Admitting: Internal Medicine

## 2014-11-28 NOTE — Telephone Encounter (Signed)
New problem   Pt is calling to schedule surgery for defib  He wish to have it done 11.16.16 please advise.Marland Kitchen

## 2014-11-28 NOTE — Telephone Encounter (Signed)
I placed call to cath lab and scheduled ICD implant for November 16,2016 at 12:30.(pt to arrive at 10:30).  I placed call to pt and left message on cell number to call back. Left message with person who answered home phone to have pt call office.  Pt will have lab work done on arrival to hospital.  He should take half insulin dose the evening before.  NPO after midnight and no medications the AM of procedure. Will need to verbally go over instructions with pt on phone.

## 2014-11-28 NOTE — Telephone Encounter (Signed)
Returned call.  LM that the procedure is already scheduled and that I will send this info to Dr Lovena Le and his nurse.   He will be hearing from our office prior to the procedure.

## 2014-11-28 NOTE — Telephone Encounter (Signed)
Follow Up  Pt believes that he was instructed to call to discuss the scheduled ablation. Please call.

## 2014-11-28 NOTE — Telephone Encounter (Signed)
Returned call.  Went over instructions for ICD implant; Be at [facility] [place] on Wed the 16th at 10:30: take 1/2 dose of insulin the night before procedure; absolutely nothing to eat or drink after midnight which means no medications or water the morning of the 16th; wash chest with antibacterial soap on Tues night and again on Wed morning-making sure to rinse thoroughly. Advised will have lab work drawn when he gets to hospital. He verbalizes understanding and answered all questions.

## 2014-12-01 ENCOUNTER — Telehealth: Payer: Self-pay | Admitting: Internal Medicine

## 2014-12-01 ENCOUNTER — Telehealth: Payer: Self-pay | Admitting: Family Medicine

## 2014-12-01 NOTE — Telephone Encounter (Signed)
Called and spoke to wife and states that he is having some nausea and not feeling well and wanted to see Dr. Dennard Schaumann. unfortunately he is oot and appt made with Dr. Buelah Manis.

## 2014-12-01 NOTE — Telephone Encounter (Signed)
Patient calling saying he is having some issues and has an upcoming procedure and would like to speak to you regarding this  [phone]

## 2014-12-01 NOTE — Telephone Encounter (Signed)
Spoke with patient and let him know that it could be his VT that causing him to feel dizzy.  He says he had a fever on Sat and Sun but none today.  I have asked him to continue with his procedure for Wed and to call if he begins to run a fever again.  He will but just wanted to give [place] a [place]

## 2014-12-01 NOTE — Telephone Encounter (Signed)
New problem   Pt has a problem with dizziness and sick on stomach and need to speak to you because he is due to have surgery 11.16.16. Please call pt he may want to r/s.

## 2014-12-02 ENCOUNTER — Ambulatory Visit: Payer: Medicare Other | Admitting: Family Medicine

## 2014-12-02 ENCOUNTER — Other Ambulatory Visit: Payer: Self-pay | Admitting: Family Medicine

## 2014-12-02 NOTE — Telephone Encounter (Signed)
Procedure scheduling change in hospital.  [name] procedure moved to 3pm.  Patient to arrive tomorrow at 1pm. Called patient and spoke with his wife, as he was not home.  (I spoke with patient and his wife earlier this morning).  Informed her of new instructions for tomorrow:  New time to arrive at hospital is 1pm.  Patient may take full dose of Lantus tonight and eat full breakfast tomorrow morning, before 9am. She agreed to pass information along to patient when he returns home.  She verbalized understanding and agreeable to updated plan.

## 2014-12-02 NOTE — Telephone Encounter (Signed)
Called patient who tells me he is feeling better, only has some sniffles.  States he has not had fever since Sunday. Reviewed with Dr. Lovena Le who recommends proceeding with procedure tomorrow. Patient verbalized understanding and agreeable to plan.

## 2014-12-03 ENCOUNTER — Encounter (HOSPITAL_COMMUNITY)
Admission: RE | Disposition: A | Discharge status: Home / Self Care | Payer: Self-pay | Source: Ambulatory Visit | Attending: Internal Medicine

## 2014-12-03 ENCOUNTER — Encounter (HOSPITAL_COMMUNITY): Payer: Self-pay | Admitting: General Practice

## 2014-12-03 ENCOUNTER — Ambulatory Visit (HOSPITAL_COMMUNITY)
Admission: RE | Admit: 2014-12-03 | Discharge: 2014-12-04 | Disposition: A | Discharge status: Home / Self Care | Payer: Medicare Other | Source: Ambulatory Visit | Attending: Internal Medicine | Admitting: Internal Medicine

## 2014-12-03 DIAGNOSIS — M109 Gout, unspecified: Secondary | ICD-10-CM | POA: Diagnosis not present

## 2014-12-03 DIAGNOSIS — I2510 Atherosclerotic heart disease of native coronary artery without angina pectoris: Secondary | ICD-10-CM | POA: Insufficient documentation

## 2014-12-03 DIAGNOSIS — G4730 Sleep apnea, unspecified: Secondary | ICD-10-CM | POA: Insufficient documentation

## 2014-12-03 DIAGNOSIS — I255 Ischemic cardiomyopathy: Secondary | ICD-10-CM | POA: Diagnosis not present

## 2014-12-03 DIAGNOSIS — I132 Hypertensive heart and chronic kidney disease with heart failure and with stage 5 chronic kidney disease, or end stage renal disease: Secondary | ICD-10-CM | POA: Insufficient documentation

## 2014-12-03 DIAGNOSIS — E785 Hyperlipidemia, unspecified: Secondary | ICD-10-CM | POA: Insufficient documentation

## 2014-12-03 DIAGNOSIS — Z79899 Other long term (current) drug therapy: Secondary | ICD-10-CM | POA: Insufficient documentation

## 2014-12-03 DIAGNOSIS — Z6828 Body mass index (BMI) 28.0-28.9, adult: Secondary | ICD-10-CM | POA: Diagnosis not present

## 2014-12-03 DIAGNOSIS — Z7982 Long term (current) use of aspirin: Secondary | ICD-10-CM | POA: Insufficient documentation

## 2014-12-03 DIAGNOSIS — N185 Chronic kidney disease, stage 5: Secondary | ICD-10-CM | POA: Insufficient documentation

## 2014-12-03 DIAGNOSIS — E1122 Type 2 diabetes mellitus with diabetic chronic kidney disease: Secondary | ICD-10-CM | POA: Diagnosis not present

## 2014-12-03 DIAGNOSIS — Z794 Long term (current) use of insulin: Secondary | ICD-10-CM | POA: Insufficient documentation

## 2014-12-03 DIAGNOSIS — E669 Obesity, unspecified: Secondary | ICD-10-CM | POA: Insufficient documentation

## 2014-12-03 DIAGNOSIS — I509 Heart failure, unspecified: Secondary | ICD-10-CM | POA: Diagnosis present

## 2014-12-03 DIAGNOSIS — I472 Ventricular tachycardia, unspecified: Secondary | ICD-10-CM

## 2014-12-03 DIAGNOSIS — F17210 Nicotine dependence, cigarettes, uncomplicated: Secondary | ICD-10-CM | POA: Diagnosis not present

## 2014-12-03 DIAGNOSIS — I5042 Chronic combined systolic (congestive) and diastolic (congestive) heart failure: Secondary | ICD-10-CM | POA: Insufficient documentation

## 2014-12-03 DIAGNOSIS — Z95810 Presence of automatic (implantable) cardiac defibrillator: Secondary | ICD-10-CM

## 2014-12-03 HISTORY — DX: Presence of automatic (implantable) cardiac defibrillator: Z95.810

## 2014-12-03 HISTORY — DX: Gout, unspecified: M10.9

## 2014-12-03 HISTORY — PX: EP IMPLANTABLE DEVICE: SHX172B

## 2014-12-03 LAB — CBC
HEMATOCRIT: 30 % — AB (ref 39.0–52.0)
Hemoglobin: 9.9 g/dL — ABNORMAL LOW (ref 13.0–17.0)
MCH: 28.9 pg (ref 26.0–34.0)
MCHC: 33 g/dL (ref 30.0–36.0)
MCV: 87.5 fL (ref 78.0–100.0)
PLATELETS: 200 10*3/uL (ref 150–400)
RBC: 3.43 MIL/uL — AB (ref 4.22–5.81)
RDW: 14.9 % (ref 11.5–15.5)
WBC: 8.5 10*3/uL (ref 4.0–10.5)

## 2014-12-03 LAB — GLUCOSE, CAPILLARY
GLUCOSE-CAPILLARY: 105 mg/dL — AB (ref 65–99)
GLUCOSE-CAPILLARY: 95 mg/dL (ref 65–99)
Glucose-Capillary: 90 mg/dL (ref 65–99)
Glucose-Capillary: 221 mg/dL — ABNORMAL HIGH (ref 65–99)

## 2014-12-03 LAB — SURGICAL PCR SCREEN
MRSA, PCR: NEGATIVE
STAPHYLOCOCCUS AUREUS: NEGATIVE

## 2014-12-03 LAB — BASIC METABOLIC PANEL
ANION GAP: 13 (ref 5–15)
BUN: 93 mg/dL — ABNORMAL HIGH (ref 6–20)
CALCIUM: 8.7 mg/dL — AB (ref 8.9–10.3)
CO2: 28 mmol/L (ref 22–32)
Chloride: 99 mmol/L — ABNORMAL LOW (ref 101–111)
Creatinine, Ser: 5.01 mg/dL — ABNORMAL HIGH (ref 0.61–1.24)
GFR, EST AFRICAN AMERICAN: 12 mL/min — AB (ref >60)
GFR, EST NON AFRICAN AMERICAN: 10 mL/min — AB (ref >60)
Glucose, Bld: 105 mg/dL — ABNORMAL HIGH (ref 65–99)
Potassium: 2.9 mmol/L — ABNORMAL LOW (ref 3.5–5.1)
Sodium: 140 mmol/L (ref 135–145)

## 2014-12-03 LAB — PROTIME-INR
INR: 0.99 (ref 0.00–1.49)
Prothrombin Time: 13.3 seconds (ref 11.6–15.2)

## 2014-12-03 SURGERY — ICD IMPLANT

## 2014-12-03 MED ORDER — ALPRAZOLAM 0.5 MG PO TABS
0.5000 mg | ORAL_TABLET | Freq: Every evening | ORAL | Status: DC | PRN
  Administered 2014-12-03: 20:00:00 0.5 mg via ORAL
  Filled 2014-12-03: qty 1

## 2014-12-03 MED ORDER — HEPARIN (PORCINE) IN NACL 2-0.9 UNIT/ML-% IJ SOLN
INTRAMUSCULAR | Status: DC | PRN
  Administered 2014-12-03: 500 mL

## 2014-12-03 MED ORDER — COLCHICINE 0.6 MG PO TABS
0.3000 mg | ORAL_TABLET | Freq: Every day | ORAL | Status: DC
  Administered 2014-12-03 – 2014-12-04 (×2): 0.3 mg via ORAL
  Filled 2014-12-03 (×2): qty 1

## 2014-12-03 MED ORDER — FENTANYL CITRATE (PF) 100 MCG/2ML IJ SOLN
INTRAMUSCULAR | Status: DC | PRN
  Administered 2014-12-03: 12.5 ug via INTRAVENOUS
  Administered 2014-12-03: 25 ug via INTRAVENOUS
  Administered 2014-12-03 (×2): 12.5 ug via INTRAVENOUS

## 2014-12-03 MED ORDER — MIDAZOLAM HCL 5 MG/5ML IJ SOLN
INTRAMUSCULAR | Status: DC | PRN
  Administered 2014-12-03 (×4): 1 mg via INTRAVENOUS

## 2014-12-03 MED ORDER — LIDOCAINE HCL (PF) 1 % IJ SOLN
INTRAMUSCULAR | Status: AC
  Filled 2014-12-03: qty 60

## 2014-12-03 MED ORDER — INSULIN GLARGINE 100 UNIT/ML ~~LOC~~ SOLN
30.0000 [IU] | Freq: Every day | SUBCUTANEOUS | Status: DC
  Administered 2014-12-03: 21:00:00 30 [IU] via SUBCUTANEOUS
  Filled 2014-12-03 (×2): qty 0.3

## 2014-12-03 MED ORDER — CEFAZOLIN SODIUM-DEXTROSE 2-3 GM-% IV SOLR
INTRAVENOUS | Status: AC
  Filled 2014-12-03: qty 50

## 2014-12-03 MED ORDER — ONDANSETRON HCL 4 MG/2ML IJ SOLN: 4.0000 mg | Freq: Four times a day (QID) | INTRAMUSCULAR | Status: DC | PRN

## 2014-12-03 MED ORDER — CEFAZOLIN SODIUM-DEXTROSE 2-3 GM-% IV SOLR
2.0000 g | Freq: Four times a day (QID) | INTRAVENOUS | Status: DC
  Filled 2014-12-03 (×2): qty 50

## 2014-12-03 MED ORDER — METOLAZONE 2.5 MG PO TABS
2.5000 mg | ORAL_TABLET | Freq: Every day | ORAL | Status: DC
  Administered 2014-12-03 – 2014-12-04 (×2): 2.5 mg via ORAL
  Filled 2014-12-03 (×2): qty 1

## 2014-12-03 MED ORDER — METOPROLOL SUCCINATE ER 25 MG PO TB24
25.0000 mg | ORAL_TABLET | Freq: Every day | ORAL | Status: DC
  Administered 2014-12-04: 25 mg via ORAL
  Filled 2014-12-03: qty 1

## 2014-12-03 MED ORDER — POTASSIUM CHLORIDE CRYS ER 20 MEQ PO TBCR
40.0000 meq | EXTENDED_RELEASE_TABLET | Freq: Once | ORAL | Status: AC
  Administered 2014-12-03: 40 meq via ORAL
  Filled 2014-12-03: qty 2

## 2014-12-03 MED ORDER — LIDOCAINE HCL (PF) 1 % IJ SOLN
INTRAMUSCULAR | Status: DC | PRN
  Administered 2014-12-03: 60 mL

## 2014-12-03 MED ORDER — HYDRALAZINE HCL 50 MG PO TABS
50.0000 mg | ORAL_TABLET | Freq: Two times a day (BID) | ORAL | Status: DC
  Administered 2014-12-03 – 2014-12-04 (×2): 50 mg via ORAL
  Filled 2014-12-03 (×2): qty 1

## 2014-12-03 MED ORDER — MIDAZOLAM HCL 5 MG/5ML IJ SOLN
INTRAMUSCULAR | Status: AC
  Filled 2014-12-03: qty 5

## 2014-12-03 MED ORDER — HYDRALAZINE HCL 20 MG/ML IJ SOLN
5.0000 mg | INTRAMUSCULAR | Status: DC | PRN
  Administered 2014-12-03: 19:00:00 5 mg via INTRAVENOUS
  Filled 2014-12-03: qty 1

## 2014-12-03 MED ORDER — SODIUM CHLORIDE 0.9 % IV SOLN
INTRAVENOUS | Status: DC
  Filled 2014-12-03: qty 1000

## 2014-12-03 MED ORDER — CEFAZOLIN SODIUM-DEXTROSE 2-3 GM-% IV SOLR
2.0000 g | INTRAVENOUS | Status: AC
  Administered 2014-12-03: 2 g via INTRAVENOUS

## 2014-12-03 MED ORDER — TORSEMIDE 100 MG PO TABS
100.0000 mg | ORAL_TABLET | Freq: Two times a day (BID) | ORAL | Status: DC
  Administered 2014-12-03 – 2014-12-04 (×2): 100 mg via ORAL
  Filled 2014-12-03 (×3): qty 1

## 2014-12-03 MED ORDER — CHLORHEXIDINE GLUCONATE 4 % EX LIQD
60.0000 mL | Freq: Once | CUTANEOUS | Status: DC
  Filled 2014-12-03: qty 60

## 2014-12-03 MED ORDER — SODIUM CHLORIDE 0.9 % IR SOLN
Status: AC
  Filled 2014-12-03: qty 2

## 2014-12-03 MED ORDER — POTASSIUM CHLORIDE CRYS ER 20 MEQ PO TBCR
20.0000 meq | EXTENDED_RELEASE_TABLET | Freq: Three times a day (TID) | ORAL | Status: DC
  Administered 2014-12-03 – 2014-12-04 (×2): 20 meq via ORAL
  Filled 2014-12-03 (×2): qty 1

## 2014-12-03 MED ORDER — ASPIRIN EC 81 MG PO TBEC
81.0000 mg | DELAYED_RELEASE_TABLET | Freq: Every day | ORAL | Status: DC
  Administered 2014-12-04: 81 mg via ORAL
  Filled 2014-12-03: qty 1

## 2014-12-03 MED ORDER — SODIUM CHLORIDE 0.9 % IV SOLN
INTRAVENOUS | Status: AC
  Administered 2014-12-03: 15:00:00 via INTRAVENOUS
  Filled 2014-12-03 (×2): qty 1000

## 2014-12-03 MED ORDER — SODIUM CHLORIDE 0.9 % IV SOLN
INTRAVENOUS | Status: DC
  Administered 2014-12-03: 13:00:00 via INTRAVENOUS

## 2014-12-03 MED ORDER — MUPIROCIN 2 % EX OINT
TOPICAL_OINTMENT | CUTANEOUS | Status: AC
  Administered 2014-12-03: 1 via TOPICAL
  Filled 2014-12-03: qty 22

## 2014-12-03 MED ORDER — ACETAMINOPHEN 325 MG PO TABS
325.0000 mg | ORAL_TABLET | ORAL | Status: DC | PRN
  Administered 2014-12-03: 650 mg via ORAL
  Administered 2014-12-04: 06:00:00 325 mg via ORAL
  Filled 2014-12-03 (×2): qty 2

## 2014-12-03 MED ORDER — CEFAZOLIN SODIUM-DEXTROSE 2-3 GM-% IV SOLR
2.0000 g | Freq: Two times a day (BID) | INTRAVENOUS | Status: DC
  Administered 2014-12-04: 04:00:00 2 g via INTRAVENOUS
  Filled 2014-12-03 (×2): qty 50

## 2014-12-03 MED ORDER — SODIUM CHLORIDE 0.9 % IV SOLN
INTRAVENOUS | Status: DC
  Administered 2014-12-03 (×3): via INTRAVENOUS

## 2014-12-03 MED ORDER — FENTANYL CITRATE (PF) 100 MCG/2ML IJ SOLN
INTRAMUSCULAR | Status: AC
  Filled 2014-12-03: qty 2

## 2014-12-03 MED ORDER — MUPIROCIN 2 % EX OINT
1.0000 "application " | TOPICAL_OINTMENT | Freq: Once | CUTANEOUS | Status: AC
  Administered 2014-12-03: 1 via TOPICAL
  Filled 2014-12-03: qty 22

## 2014-12-03 MED ORDER — SODIUM CHLORIDE 0.9 % IV SOLN
INTRAVENOUS | Status: DC
  Administered 2014-12-03: 15:00:00 via INTRAVENOUS

## 2014-12-03 MED ORDER — HYDRALAZINE HCL 20 MG/ML IJ SOLN
10.0000 mg | INTRAMUSCULAR | Status: DC | PRN
  Administered 2014-12-04: 06:00:00 10 mg via INTRAVENOUS
  Filled 2014-12-03: qty 1

## 2014-12-03 MED ORDER — POTASSIUM CHLORIDE CRYS ER 20 MEQ PO TBCR
EXTENDED_RELEASE_TABLET | ORAL | Status: AC
  Administered 2014-12-03: 40 meq via ORAL
  Filled 2014-12-03: qty 2

## 2014-12-03 MED ORDER — GENTAMICIN SULFATE 40 MG/ML IJ SOLN
80.0000 mg | INTRAMUSCULAR | Status: AC
  Administered 2014-12-03: 80 mg
  Filled 2014-12-03: qty 2

## 2014-12-03 SURGICAL SUPPLY — 6 items
CABLE SURGICAL S-101-97-12 (CABLE) ×2 IMPLANT
ICD ELLIPSE VR CD1411-36C (ICD Generator) ×2 IMPLANT
LEAD DURATA 7122-65CM (Lead) ×2 IMPLANT
PAD DEFIB LIFELINK (PAD) ×2 IMPLANT
SHEATH CLASSIC 7F (SHEATH) ×2 IMPLANT
TRAY PACEMAKER INSERTION (PACKS) ×2 IMPLANT

## 2014-12-03 NOTE — H&P (View-Only) (Signed)
    HPI [name] returns today for reconsideration for insertion of an ICD. He has a longstanding history of severe LV dysfunction dating back to 2010. He had normalization of his EF by echo in 2014. He did well until representing in February/March. He had worsening dyspnea. He currently has class 2 symptoms. A nuclear stress test in mid February demonstrated an EF of 20% with scar and catheterization in March demonstrated no CAD but severe LV dysfunction. He is on maximal medical therapy but cannot take an ACE/ARB due to renal insufficiency. He has stage 5 renal insufficiency and his most recent serum creatinine was 4.5. He has done well from a fluid volume perspective. His QRS duration is about 130 ms. He has not had syncope. Allergies  Allergen Reactions  . Ace Inhibitors Cough     Current Outpatient Prescriptions  Medication Sig Dispense Refill  . aspirin EC 81 MG tablet Take 81 mg by mouth daily.    . colchicine 0.6 MG tablet Take 2 tabs at first sign of gout and repeat in 1 hour if pain persists 30 tablet 0  . Darbepoetin Alfa (ARANESP, ALBUMIN FREE,) 100 MCG/ML SOLN Inject 100 mcg as directed every 30 (thirty) days. As needed when HGB is below 12.0    . hydrALAZINE (APRESOLINE) 100 MG tablet Take 100 mg by mouth 2 (two) times daily.     . Insulin Glargine (LANTUS SOLOSTAR) 100 UNIT/ML Solostar Pen Inject 30 Units into the skin daily at 10 pm. 5 pen 3  . metolazone (ZAROXOLYN) 2.5 MG tablet Take 1 tablet by mouth three times a week as needed for swelling    . metoprolol succinate (TOPROL-XL) 25 MG 24 hr tablet Take 25 mg by mouth daily.    . ONE TOUCH ULTRA TEST test strip     . potassium chloride SA (K-DUR,KLOR-CON) 20 MEQ tablet Take 1 tablet (20 mEq total) by mouth 3 (three) times daily. 90 tablet 3  . torsemide (DEMADEX) 100 MG tablet Take 100 mg by mouth 2 (two) times daily.      No current facility-administered medications for this visit.     Past Medical History    Diagnosis Date  . Chronic combined systolic and diastolic CHF (congestive heart failure) ([facility])     a. 02/2014 Echo: EF 20-25% (new), Gr 2 DD, mod conc LVH, mild AI/AS, mod MR, sev dil LA, mild TR.  . Nonischemic cardiomyopathy ([facility])     a. 02/2014 Echo: EF 20-25%;  b. 03/2014 Cath: LM nl, LAD min irregs, LCX   . 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 Hypertension   . Chronic kidney disease (CKD), stage IV (severe) ([facility]) 12/2009    a. baseline creat now ~ 4.  . Erectile dysfunction   . Colonic polyp   . Low serum testosterone level   . Tobacco abuse   . Sleep apnea 2010    Initially unable to afford CPAP  . Benign prostatic hypertrophy   . Obesity, Class I, BMI 30-34.9   . Hyperlipidemia   . Congestive heart failure (CHF) ([facility])     EF 25-30% 07/2011  . Adenomatous colon polyp 2006  . H. pylori infection 2013    treated with prevpac  . Mild Ao Stenosis and Insufficiency     ROS:   All systems reviewed and negative except as noted in the HPI.   Past Surgical History  Procedure Laterality Date  . Retinal   laser procedure      diabetic retinopathy  . Lymph node biopsy      surgical exploration of neck-not entirely clear that this represented a lymph node biopsy  . Colonoscopy  08/23/2004    Dr. Rourk-normal rectum, diminutive polyp of the rectosigmoid removed, inflamed focally adenomatous polyp  . Left and right heart catheterization with coronary angiogram N/A 03/19/2014    Procedure: LEFT AND RIGHT HEART CATHETERIZATION WITH CORONARY ANGIOGRAM;  Surgeon: Michael D Cooper, MD;  Location: [facility] CATH LAB;  Service: Cardiovascular;  Laterality: N/A;     Family History  Problem Relation Age of Onset  . Hypertension Mother   . Cancer Mother   . Cancer Father   . Diabetes Sister      Social History   Social History  . Marital Status: Married    Spouse Name: N/A  . Number of Children: 4  . Years of Education: N/A   Occupational History  . [employer] -  Retired Truck driver    Social History Main Topics  . Smoking status: Current Every Day Smoker -- 0.25 packs/day for 45 years    Types: Cigarettes    Start date: 05/31/1958  . Smokeless tobacco: Current User     Comment: 1-2 cigarettes OF THE NEW ELECTRIC CIGARETTES  . Alcohol Use: 2.4 oz/week    2 Glasses of wine, 2 Shots of liquor per week     Comment: occ  . Drug Use: No  . Sexual Activity: Not on file   Other Topics Concern  . Not on file   Social History Narrative     BP 140/40 mmHg  Pulse 55  Ht 5' 9" (1.753 m)  Wt 192 lb 12.8 oz (87.454 kg)  BMI 28.46 kg/m2  Physical Exam:  Well appearing 69 yo man, NAD HEENT: Unremarkable Neck:  7 cm JVD, no thyromegally Lymphatics:  No adenopathy Back:  No CVA tenderness Lungs:  Clear with no wheezes HEART:  Regular rate rhythm, no murmurs, no rubs, no clicks Abd:  soft, positive bowel sounds, no organomegally, no rebound, no guarding Ext:  2 plus pulses, no edema, no cyanosis, no clubbing Skin:  No rashes no nodules Neuro:  CN II through XII intact, motor grossly intact  EKG - NSR   Assess/Plan: 

## 2014-12-03 NOTE — Interval H&P Note (Signed)
History and Physical Interval Note:  12/03/2014 1:52 PM  [name]  has presented today for surgery, with the diagnosis of vt  The various methods of treatment have been discussed with the patient and family. After consideration of risks, benefits and other options for treatment, the patient has consented to  Procedure(s): ICD Implant (N/A) as a surgical intervention .  The patient's history has been reviewed, patient examined, no change in status, stable for surgery.  I have reviewed the patient's chart and labs.  Questions were answered to the patient's satisfaction.     [name]

## 2014-12-04 ENCOUNTER — Ambulatory Visit (HOSPITAL_COMMUNITY): Payer: Medicare Other

## 2014-12-04 ENCOUNTER — Encounter (HOSPITAL_COMMUNITY): Payer: Self-pay | Admitting: Internal Medicine

## 2014-12-04 DIAGNOSIS — I255 Ischemic cardiomyopathy: Secondary | ICD-10-CM | POA: Diagnosis not present

## 2014-12-04 DIAGNOSIS — Z6828 Body mass index (BMI) 28.0-28.9, adult: Secondary | ICD-10-CM | POA: Diagnosis not present

## 2014-12-04 DIAGNOSIS — E785 Hyperlipidemia, unspecified: Secondary | ICD-10-CM | POA: Diagnosis not present

## 2014-12-04 DIAGNOSIS — I2510 Atherosclerotic heart disease of native coronary artery without angina pectoris: Secondary | ICD-10-CM | POA: Diagnosis not present

## 2014-12-04 DIAGNOSIS — N185 Chronic kidney disease, stage 5: Secondary | ICD-10-CM | POA: Diagnosis not present

## 2014-12-04 DIAGNOSIS — M109 Gout, unspecified: Secondary | ICD-10-CM | POA: Diagnosis not present

## 2014-12-04 DIAGNOSIS — G4730 Sleep apnea, unspecified: Secondary | ICD-10-CM | POA: Diagnosis not present

## 2014-12-04 DIAGNOSIS — Z79899 Other long term (current) drug therapy: Secondary | ICD-10-CM | POA: Diagnosis not present

## 2014-12-04 DIAGNOSIS — F17210 Nicotine dependence, cigarettes, uncomplicated: Secondary | ICD-10-CM | POA: Diagnosis not present

## 2014-12-04 DIAGNOSIS — E669 Obesity, unspecified: Secondary | ICD-10-CM | POA: Diagnosis not present

## 2014-12-04 DIAGNOSIS — I472 Ventricular tachycardia: Secondary | ICD-10-CM | POA: Diagnosis not present

## 2014-12-04 DIAGNOSIS — I132 Hypertensive heart and chronic kidney disease with heart failure and with stage 5 chronic kidney disease, or end stage renal disease: Secondary | ICD-10-CM | POA: Diagnosis not present

## 2014-12-04 DIAGNOSIS — I5042 Chronic combined systolic (congestive) and diastolic (congestive) heart failure: Secondary | ICD-10-CM | POA: Diagnosis not present

## 2014-12-04 DIAGNOSIS — Z4682 Encounter for fitting and adjustment of non-vascular catheter: Secondary | ICD-10-CM | POA: Diagnosis not present

## 2014-12-04 DIAGNOSIS — Z794 Long term (current) use of insulin: Secondary | ICD-10-CM | POA: Diagnosis not present

## 2014-12-04 DIAGNOSIS — E1122 Type 2 diabetes mellitus with diabetic chronic kidney disease: Secondary | ICD-10-CM | POA: Diagnosis not present

## 2014-12-04 DIAGNOSIS — Z95810 Presence of automatic (implantable) cardiac defibrillator: Secondary | ICD-10-CM

## 2014-12-04 DIAGNOSIS — Z7982 Long term (current) use of aspirin: Secondary | ICD-10-CM | POA: Diagnosis not present

## 2014-12-04 LAB — BASIC METABOLIC PANEL
ANION GAP: 13 (ref 5–15)
BUN: 90 mg/dL — AB (ref 6–20)
CHLORIDE: 97 mmol/L — AB (ref 101–111)
CO2: 28 mmol/L (ref 22–32)
Calcium: 8.7 mg/dL — ABNORMAL LOW (ref 8.9–10.3)
Creatinine, Ser: 4.83 mg/dL — ABNORMAL HIGH (ref 0.61–1.24)
GFR calc Af Amer: 13 mL/min — ABNORMAL LOW (ref >60)
GFR, EST NON AFRICAN AMERICAN: 11 mL/min — AB (ref >60)
Glucose, Bld: 149 mg/dL — ABNORMAL HIGH (ref 65–99)
POTASSIUM: 3.4 mmol/L — AB (ref 3.5–5.1)
SODIUM: 138 mmol/L (ref 135–145)

## 2014-12-04 LAB — GLUCOSE, CAPILLARY
GLUCOSE-CAPILLARY: 56 mg/dL — AB (ref 65–99)
Glucose-Capillary: 99 mg/dL (ref 65–99)

## 2014-12-04 MED ORDER — OXYCODONE-ACETAMINOPHEN 5-325 MG PO TABS
1.0000 | ORAL_TABLET | Freq: Once | ORAL | Status: AC
  Administered 2014-12-04: 01:00:00 2 via ORAL
  Filled 2014-12-04: qty 2

## 2014-12-04 MED ORDER — YOU HAVE A PACEMAKER BOOK
Freq: Once | Status: AC
  Administered 2014-12-04: 09:00:00
  Filled 2014-12-04: qty 1

## 2014-12-04 NOTE — H&P (Signed)
  ICD Criteria  Current LVEF:25%. Within 12 months prior to implant: Yes   Heart failure history: Yes, Class II  Cardiomyopathy history: Yes, Ischemic Cardiomyopathy.  Atrial Fibrillation/Atrial Flutter: No.  Ventricular tachycardia history: No.  Cardiac arrest history: No.  History of syndromes with risk of sudden death: No.  Previous ICD: No.  Current ICD indication: Primary  PPM indication: No.   Class I or II Bradycardia indication present: No  Beta Blocker therapy for 3 or more months: Yes, prescribed.   Ace Inhibitor/ARB therapy for 3 or more months: No, medical reason.

## 2014-12-04 NOTE — Discharge Instructions (Signed)
. ° ° °  Supplemental Discharge Instructions for  Pacemaker/Defibrillator Patients  Activity No heavy lifting or vigorous activity with your left arm for 6 to 8 weeks.  Do not raise your left arm above your head for one week.  Gradually raise your affected arm as drawn below.          12/08/14                     12/09/14                   12/10/14               12/11/14 __  NO DRIVING for 1week  ; you may begin driving on   [id number]  .  WOUND CARE - Keep the wound area clean and dry.  Do not get this area wet for one week. No showers for one week; you may shower on  12/11/14   . - The tape/steri-strips on your wound will fall off; do not pull them off.  No bandage is needed on the site.  DO  NOT apply any creams, oils, or ointments to the wound area. - If you notice any drainage or discharge from the wound, any swelling or bruising at the site, or you develop a fever > 101? F after you are discharged home, call the office at once.  Special Instructions - You are still able to use cellular telephones; use the ear opposite the side where you have your pacemaker/defibrillator.  Avoid carrying your cellular phone near your device. - When traveling through airports, show security personnel your identification card to avoid being screened in the metal detectors.  Ask the security personnel to use the hand wand. - Avoid arc welding equipment, MRI testing (magnetic resonance imaging), TENS units (transcutaneous nerve stimulators).  Call the office for questions about other devices. - Avoid electrical appliances that are in poor condition or are not properly grounded. - Microwave ovens are safe to be near or to operate.  Additional information for defibrillator patients should your device go off: - If your device goes off ONCE and you feel fine afterward, notify the device clinic nurses. - If your device goes off ONCE and you do not feel well afterward, call 911. - If your device goes off TWICE,  call 911. - If your device goes off THREE times in one day, call 911.  DO NOT DRIVE YOURSELF OR A FAMILY MEMBER WITH A DEFIBRILLATOR TO THE HOSPITAL--CALL 911.

## 2014-12-04 NOTE — Discharge Summary (Signed)
ELECTROPHYSIOLOGY PROCEDURE DISCHARGE SUMMARY    Patient ID: [name],  MRN: [id number], DOB/AGE: [birth year] 69 y.o.  Admit date: 12/03/2014 Discharge date: 12/04/2014  Primary Care Physician: Odette Fraction, MD Primary Cardiologist: Dr. Jacinta Shoe Electrophysiologist: Dr. Lovena Le  Primary Discharge Diagnosis:  Nonischemic cardiomyopathy  Secondary Discharge Diagnosis:  1. CRI, stage V 2. HTN 3. DM 4. hyperlipidemia  Allergies  Allergen Reactions  . Ace Inhibitors Cough     Procedures This Admission:  1.  Implantation of a STJ single chamber ICD on 12/03/15 by Dr Lovena Le.  The patient received a St. Jude (serial Number [id number]) ICD, with a St. Jude (serial number [id number]) right ventricular defibrillator lead.  DFT's were deferred at time of implant.  There were no immediate post procedure complications. 2.  CXR on 12/04/14 demonstrated no pneumothorax status post device implantation.   Brief HPI: [name] is a 69 y.o. male was referred to electrophysiology in the outpatient setting for consideration of ICD implantation.  Past medical history includes NICM, HTN, CRF stage V, hyperlipidemia.  The patient has persistent LV dysfunction, given his severe renal disease has been unable to be maintained on an ACE or ARB.  Risks, benefits, and alternatives to ICD implantation were reviewed with the patient who wished to proceed.   Hospital Course:  The patient was admitted and underwent implantation of a STJ ICD with details as outlined above. He was monitored on telemetry overnight which demonstrated SR.  Left chest was without hematoma or ecchymosis.  The device was interrogated and found to be functioning normally.  CXR was obtained and demonstrated no pneumothorax status post device implantation.  Wound care, arm mobility, and restrictions were reviewed with the patient.  The patient's K+ pre-procedure was low and he was given replacement.  His BMET this morning is  stable  He was given pain med last night for gout pain, he is being provided list of foods to avoid and instructed to f/u with his PMD.  He is counseled on smoking, his wife is at bedside and reports "he will never quit", but the patient states he will work on it.  The patient was examined and considered stable for discharge to home. He is arranged for wound check appointment to include a BMET draw as well.  The patient denies any CP or SOB.  The patient's discharge medications include beta blocker (metoprolol, no ACE secondary to stage V CRI, he is getting hydralazine, torsemide and metolazone).   Physical Exam: Filed Vitals:   12/04/14 0420 12/04/14 0425 12/04/14 0550 12/04/14 0735  BP: 176/44 176/44 179/40   Pulse: 79 84  88  Temp: 98.1 F (36.7 C)   97.9 F (36.6 C)  TempSrc: Oral   Oral  Resp: 20   16  Height:      Weight: 189 lb 9.5 oz (86 kg)     SpO2: 98% 98%  98%    GEN- The patient is well appearing, alert and oriented x 3 today.   HEENT: normocephalic, atraumatic; sclera clear, conjunctiva pink; hearing intact; oropharynx clear Lungs- Clear to ausculation bilaterally, normal work of breathing.  No wheezes, rales, rhonchi Heart- Regular rate and rhythm, no murmurs, rubs or gallops GI- soft, non-tender, non-distended, bowel sounds present Extremities- no clubbing, cyanosis, or edema MS- no significant deformity or atrophy Skin- warm and dry, no rash or lesion, left chest without hematoma/ecchymosis Psych- euthymic mood, full affect Neuro- no gross defecits  Labs:   Lab Results  Component Value Date   WBC 8.5 12/03/2014   HGB 9.9* 12/03/2014   HCT 30.0* 12/03/2014   MCV 87.5 12/03/2014   PLT 200 12/03/2014     Recent Labs Lab 12/04/14 0835  NA 138  K 3.4*  CL 97*  CO2 28  BUN 90*  CREATININE 4.83*  CALCIUM 8.7*  GLUCOSE 149*   12/04/14 CXR FINDINGS: The heart size and mediastinal contours are within normal limits. Both lungs are clear. No pneumothorax  or pleural effusion is noted. Single lead left-sided pacemaker is in grossly good position. The visualized skeletal structures are unremarkable. IMPRESSION: Interval placement of single lead left-sided pacemaker in grossly good position. No acute cardiopulmonary abnormality seen.  Discharge Medications:    Medication List    TAKE these medications        aspirin EC 81 MG tablet  Take 81 mg by mouth daily.     COLCRYS 0.6 MG tablet  Generic drug:  colchicine  Take 2 tabs at first sign of gout and repeat in 1 hour if pain persist s     hydrALAZINE 100 MG tablet  Commonly known as:  APRESOLINE  Take 50 mg by mouth 2 (two) times daily.     Insulin Glargine 100 UNIT/ML Solostar Pen  Commonly known as:  LANTUS SOLOSTAR  Inject 30 Units into the skin daily at 10 pm.     metolazone 2.5 MG tablet  Commonly known as:  ZAROXOLYN  Take 1 tablet by mouth three times a week as needed for swelling     metoprolol succinate 50 MG 24 hr tablet  Commonly known as:  TOPROL-XL  Take 25 mg by mouth daily.     ONE TOUCH ULTRA TEST test strip  Generic drug:  glucose blood     potassium chloride SA 20 MEQ tablet  Commonly known as:  K-DUR,KLOR-CON  Take 1 tablet (20 mEq total) by mouth 3 (three) times daily.     torsemide 100 MG tablet  Commonly known as:  DEMADEX  Take 100 mg by mouth 2 (two) times daily.        Disposition: Home Discharge Instructions    Diet - low sodium heart healthy    Complete by:  As directed      Increase activity slowly    Complete by:  As directed           Follow-up Information    Follow up with [facility] On 12/15/2014.   Specialty:  Cardiology   Why:  11:30AM wound check   Contact information:   [address], [place] [phone]      Follow up with Cristopher Peru, MD On 03/12/2015.   Specialty:  Cardiology   Why:  11;30AM   Contact information:   [id number] [place] [address] [phone]       Duration of Discharge Encounter: Greater than 30 minutes including physician time.  Venetia Night, PA-C 12/04/2014 11:11 AM   EP Attending  Patient seen and examined. Agree with above. Cu[place]z for discharge. Usual followup.  Mikle Bosworth.D.

## 2014-12-05 MED FILL — Sodium Chloride IV Soln 0.9%: INTRAVENOUS | Qty: 1000 | Status: AC

## 2014-12-05 MED FILL — Dextrose Inj 50%: INTRAVENOUS | Qty: 50 | Status: AC

## 2014-12-08 ENCOUNTER — Telehealth: Payer: Self-pay | Admitting: Internal Medicine

## 2014-12-08 NOTE — Telephone Encounter (Signed)
Patient returned call.  He states that today he noticed redness at his ICD insertion site.  He states it looks like a rash.  He denies itching, swelling, discharge, pain, or other signs of infection.  Patient agreeable to device clinic appointment on 12/09/14 at 2:30pm to check wound.  Patient denies any additional questions at this time.  Scheduler made aware to add patient to schedule.

## 2014-12-08 NOTE — Telephone Encounter (Signed)
New message     Pt had a defibulator put in last wed. He has a rash around the defib site.  It does not itch.  Please call

## 2014-12-08 NOTE — Telephone Encounter (Signed)
LM on home phone and cell phone requesting that patient call back.  Gave device clinic phone number.

## 2014-12-09 ENCOUNTER — Ambulatory Visit (INDEPENDENT_AMBULATORY_CARE_PROVIDER_SITE_OTHER): Payer: Medicare Other | Admitting: *Deleted

## 2014-12-09 ENCOUNTER — Encounter: Payer: Self-pay | Admitting: Internal Medicine

## 2014-12-09 DIAGNOSIS — I5033 Acute on chronic diastolic (congestive) heart failure: Secondary | ICD-10-CM | POA: Diagnosis not present

## 2014-12-09 DIAGNOSIS — I429 Cardiomyopathy, unspecified: Secondary | ICD-10-CM

## 2014-12-09 LAB — CUP PACEART INCLINIC DEVICE CHECK
Brady Statistic RV Percent Paced: 0 %
HighPow Impedance: 66.375
Implantable Lead Implant Date: 20161116
Lead Channel Impedance Value: 400 Ohm
Lead Channel Pacing Threshold Amplitude: 1 V
Lead Channel Pacing Threshold Pulse Width: 0.5 ms
Lead Channel Setting Pacing Amplitude: 3.5 V
Lead Channel Setting Pacing Pulse Width: 0.5 ms
MDC IDC LEAD LOCATION: 753860
MDC IDC LEAD MODEL: 7122
MDC IDC MSMT BATTERY REMAINING LONGEVITY: 96 mo
MDC IDC MSMT LEADCHNL RV PACING THRESHOLD AMPLITUDE: 1 V
MDC IDC MSMT LEADCHNL RV PACING THRESHOLD PULSEWIDTH: 0.5 ms
MDC IDC MSMT LEADCHNL RV SENSING INTR AMPL: 12 mV
MDC IDC PG SERIAL: 7228612
MDC IDC SESS DTM: 20161122202420
MDC IDC SET LEADCHNL RV SENSING SENSITIVITY: 0.5 mV

## 2014-12-10 ENCOUNTER — Other Ambulatory Visit: Payer: Self-pay | Admitting: Family Medicine

## 2014-12-10 MED ORDER — ONETOUCH ULTRA BLUE VI STRP: ORAL_STRIP | Status: DC

## 2014-12-10 NOTE — Progress Notes (Signed)
Wound check appointment. Steri-strips /tegaderm patch removed. Wound without redness or edema. Incision edges approximated, wound well healed. A small amount of redness noted just above tegaderm patch -patient encouraged to call if it does not subside with the removal of the patch. Normal device function. Threshold, sensing, and impedance consistent with implant measurements. Device programmed at 3.5V for extra safety margin until 3 month visit. Histogram distribution appropriate for patient and level of activity. (1) ventricular arrhythmia noted. Patient educated about wound care, arm mobility, lifting restrictions, shock plan. ROV on 11/28 for wound recheck only (N/C).

## 2014-12-10 NOTE — Telephone Encounter (Signed)
Test strips refilled

## 2014-12-15 ENCOUNTER — Ambulatory Visit (INDEPENDENT_AMBULATORY_CARE_PROVIDER_SITE_OTHER): Payer: Medicare Other | Admitting: *Deleted

## 2014-12-15 ENCOUNTER — Other Ambulatory Visit (INDEPENDENT_AMBULATORY_CARE_PROVIDER_SITE_OTHER): Payer: Medicare Other | Admitting: *Deleted

## 2014-12-15 DIAGNOSIS — T8189XD Other complications of procedures, not elsewhere classified, subsequent encounter: Secondary | ICD-10-CM

## 2014-12-15 DIAGNOSIS — I10 Essential (primary) hypertension: Secondary | ICD-10-CM

## 2014-12-15 LAB — BASIC METABOLIC PANEL
BUN: 96 mg/dL — AB (ref 7–25)
CHLORIDE: 95 mmol/L — AB (ref 98–110)
CO2: 27 mmol/L (ref 20–31)
Calcium: 8.4 mg/dL — ABNORMAL LOW (ref 8.6–10.3)
Creat: 4.61 mg/dL — ABNORMAL HIGH (ref 0.70–1.18)
Glucose, Bld: 129 mg/dL — ABNORMAL HIGH (ref 65–99)
POTASSIUM: 3.2 mmol/L — AB (ref 3.5–5.3)
Sodium: 137 mmol/L (ref 135–146)

## 2014-12-15 NOTE — Progress Notes (Signed)
Wound re-check only due to tegaderm redness/irritation. Per pt, area of concern has cleared up. Visual check confirms. ROV w/ Dr. Lovena Le 03/12/15.   Device not interrogated.

## 2014-12-18 ENCOUNTER — Ambulatory Visit (INDEPENDENT_AMBULATORY_CARE_PROVIDER_SITE_OTHER): Payer: Medicare Other | Admitting: Family Medicine

## 2014-12-18 ENCOUNTER — Encounter: Payer: Self-pay | Admitting: Family Medicine

## 2014-12-18 ENCOUNTER — Telehealth: Payer: Self-pay | Admitting: Internal Medicine

## 2014-12-18 VITALS — BP 146/40 | HR 78 | Temp 98.0°F | Resp 18 | Ht 71.0 in | Wt 197.0 lb

## 2014-12-18 DIAGNOSIS — R739 Hyperglycemia, unspecified: Secondary | ICD-10-CM

## 2014-12-18 DIAGNOSIS — I10 Essential (primary) hypertension: Secondary | ICD-10-CM

## 2014-12-18 MED ORDER — POTASSIUM CHLORIDE CRYS ER 20 MEQ PO TBCR: 30.0000 meq | EXTENDED_RELEASE_TABLET | Freq: Three times a day (TID) | ORAL | Status: DC

## 2014-12-18 NOTE — Telephone Encounter (Signed)
Informed patient of results and verbal understanding expressed.  Instructed patient to INCREASE KDUR to 30 meq (1.5 tablets) TID. BMET scheduled 12/15. Patient agrees with treatment plan.

## 2014-12-18 NOTE — Telephone Encounter (Signed)
Returned Pt's call.  Advised that his labs haven't been reviewed by the Dr yet.  Advised that he will be called as soon as reviewed.

## 2014-12-18 NOTE — Telephone Encounter (Signed)
New message ° ° ° ° °Pt want lab results °

## 2014-12-18 NOTE — Progress Notes (Signed)
Subjective:    Patient ID: [name], male    DOB: 06/04/1945, 69 y.o.   MRN: [id number]  HPI  05/23/14 Patient is here today for follow-up. Patient looks extremely weak.  Patient has lost 10 pounds since March. He looks dehydrated. In the hospital, the patient perked up when he was given IV fluids. There was no evidence of acidosis and his potassium was stable and therefore there was no indication for emergent dialysis. However the patient now reports nausea. He reports inability to tolerate by mouth. He reports profound weakness. He is here today for hospital follow-up.  At that time, my plan was: I spent 30 minutes with the patient discussing his medical comorbidities. I believe this is a perfect storm of his ongoing medical problems. I believe his kidney failure is worsening. Although he has no indications for emergent dialysis, I believe the symptoms are a sign that the patient is becoming uremic and would benefit from hemodialysis. Coupled with severe stage IV heart failure, I believe this is a completely logical explanation for his profound fatigue, nausea, early satiety, and weakness. I see very little that I can offer in the way to help this patient. However I do think the patient would benefit from a decreased dose of his diuretic. He is clinically dehydrated today. I believe this is exacerbating his weakness and likely contributing to his fatigue. Therefore I recommended decreasing the dose of his torsemide to 50 mg twice a day and that he discontinue metolazone. He is scheduled to follow-up with his nephrologist the first of next week. I recommended that he strongly consider beginning hemodialysis.  06/23/14 Patient is here for CPE.  [name] weight is back up. He appears much stronger today. He appears euvolemic. He went back to his previous dose of his diaphoretic. He has seen his nephrologist and at the present time there is no indication to proceed with dialysis. Unfortunately this  morning he developed a gout flare. He has severe pain in his left ankle. Patient discontinued uloric due to cost. This seemed to contribute to his recent gout flare. However he cannot afford the dictation and allopurinol did not work for him in the past. He has seen the cardiologist and they have decided not to proceed with a ICD.  He has not taken any of his medication in more than 24 hours which explains his blood pressure. AT that time, my plan was: Resume blood pressure medication immediately. Obtain fasting lab work including a CBC, CMP, fasting lipid panel, and PSA. Patient would like to defer the rectal exam today because of the pain in his hand with his ankle. His colonoscopy was in 2013 and is not yet due. Patient has had Pneumovax 23, Prevnar 13, his flu shot, and his tetanus vaccine. Therefore immunizations are up-to-date. I'll give the patient colchicine 0.6 mg tabs for his gout attack. He can take 2 tablets immediately and then 1 additional tablet in 2 hours if the pain persists.  09/30/14 He is here today for follow-up. He is here today mainly for follow-up of his diabetes. He is due for a flu shot. His hemoglobin A1c is outstanding at 6.0. Does have occasional hypoglycemic episodes. He does not take a standard dose of insulin but instead takes anywhere from 10-15 units at night based upon his blood sugar. His blood pressure today is extremely high 160/60. However he has not taken his medication and he did eat a country ham biscuit this morning. He states his  blood pressure at home is better. 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 He also has a history of severe congestive heart failure with an ejection fraction of 20-25%. He would potentially be a good candidate for spironolactone given these 2 reasons to help control his blood pressure although I'll be very cautious and worried about hyperkalemia should restart that medication. He also has a history of dyslipidemia. However  his LDL cholesterol is acceptable at 101 and his HDL cholesterol is outstanding at 68.  At that time, my plan was: [name] kidney function is stable. However I emphasized that we need to keep his blood pressure under control to prevent deterioration his kidney function. I've asked the patient to check his blood pressure everyday and notify me of the values in one week. If consistently greater than 140/90, I will start the patient on low-dose spironolactone as I am suspicious the patient may have Conn syndrome. We will need to monitor his potassium closely and discontinue his potassium supplement. I will see the patient is very concerned about doing this given his kidney function and therefore he would like to check his blood pressures at home first. His diabetes is well controlled. I recommended that he schedule back on his insulin and ovoid hypoglycemia and decrease his total insulin dose per day to less than 10 units. Patient received his flu shot today  11/06/14 Patient plans of weakness and fatigue. First of all he says he is extremely tired all the time area however he admits he is not sleeping well at night because he is constantly waking up to go to the restroom and void. However he also has near drop attacks. He states that frequently will feel extremely weak, he will feel palpitations and chest, he will feel lightheaded almost presyncopal. Sounds like his blood pressures dropping and that his heart is having some kind of arrhythmia. This is concerning given his past medical history. He blames it on the carvedilol, his cardiologist just switched him to Toprol today to see if he tolerates it better. He does complain of fatigue on the carvedilol however I'm concerned by these weak spells that sound like presyncopal episodes. The patient calls these dizzy spells but he denies any vertigo.  At that time, my plan was:  I would like the patient to decrease his torsemide to 50 mg in the afternoon to see if  it will cut down on the polyuria at night and hopefully allow him to sleep better. We will need to monitor for fluid overload closely. I'm fine with the patient trying a different beta blocker to see if he can tolerate it better. However I will also have the patient wear a Holter monitor to see if he is having any cardiac arrhythmias that may be causing these "weak spells".  12/18/14 Holter monitor revealed brief runs of VT.  Underwent implantation of a STJ single chamber ICD on 11/17 by Dr. Lovena Le.  3 days ago, the patient's left ankle began hurting typical to his previous gout exacerbations. The ankle today is warm and tender to palpation. It hurts with range of motion. However it is slowly started to improve over the last 24 hours. However over the last 3 days his sugars a been ranging between 200-300.  Typically his sugars are less than 150. His last A1c was 6.0 in September. This leads me to believe that this is reactive. He denies any infections. He denies any cough or sore throat shortness of breath nausea  vomiting diarrhea or dysuria. His ICD site does not show any signs of infection.   Past Medical History  Diagnosis Date  . Chronic combined systolic and diastolic CHF (congestive heart failure) ([place])     a. 02/2014 Echo: EF 20-25% (new), Gr 2 DD, mod conc LVH, mild AI/AS, mod MR, sev dil LA, mild TR.  Marland Kitchen Nonischemic cardiomyopathy ([place])     a. 02/2014 Echo: EF 20-25%;  b. 03/2014 Cath: LM nl, LAD min irregs, LCX   . Hypertension   . Chronic kidney disease (CKD), stage IV (severe) ([place]) 12/2009    a. baseline creat now ~ 4.  . Erectile dysfunction   . Colonic polyp   . Low serum testosterone level   . Tobacco abuse   . Benign prostatic hypertrophy   . Obesity, Class I, BMI 30-34.9   . Hyperlipidemia   . Adenomatous colon polyp 2006  . H. pylori infection 2013    treated with prevpac  . Mild Ao Stenosis and Insufficiency   . AICD (automatic cardioverter/defibrillator) present     STJ  device, implanted 12/03/14 Dr. Lovena Le  . Sleep apnea 2010    Initially unable to afford CPAP  . 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 Gout    Past Surgical History  Procedure Laterality Date  . Retinal laser procedure      diabetic retinopathy  . Lymph node biopsy      surgical exploration of neck-not entirely clear that this represented a lymph node biopsy  . Colonoscopy  08/23/2004    Dr. Vivi Ferns rectum, diminutive polyp of the rectosigmoid removed, inflamed focally adenomatous polyp  . Left and right heart catheterization with coronary angiogram N/A 03/19/2014    Procedure: LEFT AND RIGHT HEART CATHETERIZATION WITH CORONARY ANGIOGRAM;  Surgeon: Blane Ohara, MD;  Location: [facility] CATH LAB;  Service: Cardiovascular;  Laterality: N/A;  . Insertion of icd  12/03/2014  . Cardiac catheterization    . Ep implantable device N/A 12/03/2014    Procedure: ICD Implant;  Surgeon: Evans Lance, MD;  Location: [place] CV LAB;  Service: Cardiovascular;  Laterality: N/A;   Current Outpatient Prescriptions on File Prior to Visit  Medication Sig Dispense Refill  . aspirin EC 81 MG tablet Take 81 mg by mouth daily.    Marland Kitchen COLCRYS 0.6 MG tablet Take 2 tabs at first sign of gout and repeat in 1 hour if pain persist s 30 tablet 3  . hydrALAZINE (APRESOLINE) 100 MG tablet Take 50 mg by mouth 2 (two) times daily.     . Insulin Glargine (LANTUS SOLOSTAR) 100 UNIT/ML Solostar Pen Inject 30 Units into the skin daily at 10 pm. 5 pen 3  . metolazone (ZAROXOLYN) 2.5 MG tablet Take 1 tablet by mouth three times a week as needed for swelling 10 tablet 1  . metoprolol succinate (TOPROL-XL) 50 MG 24 hr tablet Take 25 mg by mouth daily.    . ONE TOUCH ULTRA TEST test strip Check blood sugar each morning fasting and once daily 2 hours after a meal 100 each 5  . torsemide (DEMADEX) 100 MG tablet Take 100 mg by mouth 2 (two) times daily.      No current facility-administered  medications on file prior to visit.   Allergies  Allergen Reactions  . Ace Inhibitors Cough   Social History   Social History  . Marital Status: Married    Spouse Name: N/A  .  Number of Children: 4  . Years of Education: N/A   Occupational History  . Semi - Retired Administrator    Social History Main Topics  . Smoking status: Current Every Day Smoker -- 0.50 packs/day for 56 years    Types: Cigarettes    Start date: 05/31/1958  . Smokeless tobacco: Never Used  . Alcohol Use: Yes     Comment: 12/03/2014 "usually have 2 glasses of wine & 2 shots of liquor/wk; nothing in the last 3-4 months"  . Drug Use: No  . Sexual Activity: Not Currently   Other Topics Concern  . Not on file   Social History Narrative   Family History  Problem Relation Age of Onset  . Hypertension Mother   . Cancer Mother   . Cancer Father   . Diabetes Sister      Review of Systems  All other systems reviewed and are negative.      Objective:   Physical Exam  Constitutional: He is oriented to person, place, and time. He appears well-developed and well-nourished.  Non-toxic appearance. He does not have a sickly appearance. He does not appear ill. No distress.  HENT:  Head: Normocephalic and atraumatic.  Right Ear: External ear normal.  Left Ear: External ear normal.  Nose: Nose normal.  Mouth/Throat: Oropharynx is clear and moist. No oropharyngeal exudate.  Eyes: Conjunctivae and EOM are normal. Pupils are equal, round, and reactive to light. Right eye exhibits no discharge. Left eye exhibits no discharge. No scleral icterus.  Neck: Normal range of motion. Neck supple. No JVD present. No tracheal deviation present. No thyromegaly present.  Cardiovascular: Normal rate, regular rhythm, normal heart sounds and intact distal pulses.  Exam reveals no gallop and no friction rub.   No murmur heard. Pulmonary/Chest: Effort normal and breath sounds normal. No stridor. No respiratory distress. He has  no wheezes. He has no rales. He exhibits no tenderness.  Abdominal: Soft. Bowel sounds are normal. He exhibits no distension and no mass. There is no tenderness. There is no rebound and no guarding.  Genitourinary: Guaiac negative stool.  Musculoskeletal: He exhibits no edema.  Lymphadenopathy:    He has no cervical adenopathy.  Neurological: He is alert and oriented to person, place, and time. He has normal reflexes. No cranial nerve deficit. He exhibits normal muscle tone. Coordination normal.  Skin: Skin is warm. No rash noted. He is not diaphoretic. No erythema. No pallor.  Psychiatric: He has a normal mood and affect. His behavior is normal. Judgment and thought content normal.  Nursing note and vitals reviewed.         Assessment & Plan:  [name] hyperglycemia is likely reactive and is due to increased levels of cortisol in the body from stress most likely from his recent heart surgery and also his current gout exacerbation. Therefore I will not change his background Lantus dose at the present time since this is been working well. Instead I will have the patient begin a sliding scale NovoLog. He will take 2 units for every 50 points greater than 150 <150-0 units 151-200-2 units, 201-200-4 units and so on.    Monitor for the next 3-4 days. If I'm correct, as his gout exacerbation resolves, his hyperglycemia should improve as well

## 2014-12-23 ENCOUNTER — Telehealth: Payer: Self-pay | Admitting: Family Medicine

## 2014-12-23 NOTE — Telephone Encounter (Signed)
Patient would like to know if you can change his prescription for his test strips to testing 6x a day. He states that is how the prescription was written for years and it is cheaper for him.   [phone]

## 2014-12-23 NOTE — Telephone Encounter (Signed)
i informed pt that the pharmacy was the ones that sent it over with that qty and i would be glad to send him in a new rx for the 500 strip per 3 months and he said just to wait until the 1st of the year as his ins will change at that time.

## 2014-12-23 NOTE — Telephone Encounter (Signed)
LMTRC

## 2015-01-01 ENCOUNTER — Other Ambulatory Visit (INDEPENDENT_AMBULATORY_CARE_PROVIDER_SITE_OTHER): Payer: Medicare Other | Admitting: *Deleted

## 2015-01-01 DIAGNOSIS — I10 Essential (primary) hypertension: Secondary | ICD-10-CM | POA: Diagnosis not present

## 2015-01-01 LAB — BASIC METABOLIC PANEL
BUN: 93 mg/dL — ABNORMAL HIGH (ref 7–25)
CALCIUM: 8.5 mg/dL — AB (ref 8.6–10.3)
CO2: 26 mmol/L (ref 20–31)
Chloride: 100 mmol/L (ref 98–110)
Creat: 5.14 mg/dL — ABNORMAL HIGH (ref 0.70–1.18)
GLUCOSE: 137 mg/dL — AB (ref 65–99)
Potassium: 3.4 mmol/L — ABNORMAL LOW (ref 3.5–5.3)
SODIUM: 139 mmol/L (ref 135–146)

## 2015-01-01 NOTE — Addendum Note (Signed)
Addended by: Eulis Foster on: 01/01/2015 10:00 AM   Modules accepted: Orders

## 2015-01-02 ENCOUNTER — Other Ambulatory Visit: Payer: Self-pay | Admitting: Family Medicine

## 2015-01-02 MED ORDER — HYDRALAZINE HCL 100 MG PO TABS: 50.0000 mg | ORAL_TABLET | Freq: Two times a day (BID) | ORAL | Status: DC

## 2015-01-02 MED ORDER — POTASSIUM CHLORIDE CRYS ER 20 MEQ PO TBCR: 30.0000 meq | EXTENDED_RELEASE_TABLET | Freq: Three times a day (TID) | ORAL | Status: DC

## 2015-01-02 MED ORDER — TORSEMIDE 100 MG PO TABS: 100.0000 mg | ORAL_TABLET | Freq: Two times a day (BID) | ORAL | Status: DC

## 2015-01-03 ENCOUNTER — Emergency Department (HOSPITAL_COMMUNITY): Payer: Medicare Other

## 2015-01-03 ENCOUNTER — Inpatient Hospital Stay (HOSPITAL_COMMUNITY)
Admission: EM | Admit: 2015-01-03 | Discharge: 2015-01-11 | DRG: 417 | Disposition: A | Discharge status: Home / Self Care | Payer: Medicare Other | Attending: Internal Medicine | Admitting: Internal Medicine

## 2015-01-03 ENCOUNTER — Encounter (HOSPITAL_COMMUNITY): Payer: Self-pay | Admitting: Emergency Medicine

## 2015-01-03 DIAGNOSIS — K8065 Calculus of gallbladder and bile duct with chronic cholecystitis with obstruction: Secondary | ICD-10-CM | POA: Diagnosis not present

## 2015-01-03 DIAGNOSIS — Z888 Allergy status to other drugs, medicaments and biological substances status: Secondary | ICD-10-CM

## 2015-01-03 DIAGNOSIS — E669 Obesity, unspecified: Secondary | ICD-10-CM | POA: Diagnosis not present

## 2015-01-03 DIAGNOSIS — K8050 Calculus of bile duct without cholangitis or cholecystitis without obstruction: Secondary | ICD-10-CM | POA: Diagnosis not present

## 2015-01-03 DIAGNOSIS — I472 Ventricular tachycardia, unspecified: Secondary | ICD-10-CM

## 2015-01-03 DIAGNOSIS — E11319 Type 2 diabetes mellitus with unspecified diabetic retinopathy without macular edema: Secondary | ICD-10-CM | POA: Diagnosis present

## 2015-01-03 DIAGNOSIS — K8010 Calculus of gallbladder with chronic cholecystitis without obstruction: Secondary | ICD-10-CM

## 2015-01-03 DIAGNOSIS — E860 Dehydration: Secondary | ICD-10-CM | POA: Diagnosis not present

## 2015-01-03 DIAGNOSIS — I447 Left bundle-branch block, unspecified: Secondary | ICD-10-CM | POA: Diagnosis present

## 2015-01-03 DIAGNOSIS — E876 Hypokalemia: Secondary | ICD-10-CM | POA: Diagnosis present

## 2015-01-03 DIAGNOSIS — F17210 Nicotine dependence, cigarettes, uncomplicated: Secondary | ICD-10-CM | POA: Diagnosis present

## 2015-01-03 DIAGNOSIS — K8031 Calculus of bile duct with cholangitis, unspecified, with obstruction: Secondary | ICD-10-CM | POA: Diagnosis present

## 2015-01-03 DIAGNOSIS — E11649 Type 2 diabetes mellitus with hypoglycemia without coma: Secondary | ICD-10-CM | POA: Diagnosis not present

## 2015-01-03 DIAGNOSIS — Z6830 Body mass index (BMI) 30.0-30.9, adult: Secondary | ICD-10-CM

## 2015-01-03 DIAGNOSIS — E1121 Type 2 diabetes mellitus with diabetic nephropathy: Secondary | ICD-10-CM | POA: Diagnosis not present

## 2015-01-03 DIAGNOSIS — M109 Gout, unspecified: Secondary | ICD-10-CM | POA: Diagnosis present

## 2015-01-03 DIAGNOSIS — E11311 Type 2 diabetes mellitus with unspecified diabetic retinopathy with macular edema: Secondary | ICD-10-CM | POA: Diagnosis not present

## 2015-01-03 DIAGNOSIS — I083 Combined rheumatic disorders of mitral, aortic and tricuspid valves: Secondary | ICD-10-CM | POA: Diagnosis present

## 2015-01-03 DIAGNOSIS — K5900 Constipation, unspecified: Secondary | ICD-10-CM | POA: Diagnosis not present

## 2015-01-03 DIAGNOSIS — J449 Chronic obstructive pulmonary disease, unspecified: Secondary | ICD-10-CM | POA: Diagnosis not present

## 2015-01-03 DIAGNOSIS — T85520A Displacement of bile duct prosthesis, initial encounter: Secondary | ICD-10-CM

## 2015-01-03 DIAGNOSIS — I10 Essential (primary) hypertension: Secondary | ICD-10-CM | POA: Diagnosis present

## 2015-01-03 DIAGNOSIS — K8020 Calculus of gallbladder without cholecystitis without obstruction: Secondary | ICD-10-CM | POA: Diagnosis not present

## 2015-01-03 DIAGNOSIS — D638 Anemia in other chronic diseases classified elsewhere: Secondary | ICD-10-CM | POA: Diagnosis not present

## 2015-01-03 DIAGNOSIS — I120 Hypertensive chronic kidney disease with stage 5 chronic kidney disease or end stage renal disease: Secondary | ICD-10-CM | POA: Diagnosis not present

## 2015-01-03 DIAGNOSIS — E1122 Type 2 diabetes mellitus with diabetic chronic kidney disease: Secondary | ICD-10-CM | POA: Diagnosis not present

## 2015-01-03 DIAGNOSIS — K830 Cholangitis: Secondary | ICD-10-CM | POA: Diagnosis not present

## 2015-01-03 DIAGNOSIS — K851 Biliary acute pancreatitis without necrosis or infection: Secondary | ICD-10-CM | POA: Diagnosis not present

## 2015-01-03 DIAGNOSIS — Z01810 Encounter for preprocedural cardiovascular examination: Secondary | ICD-10-CM | POA: Diagnosis not present

## 2015-01-03 DIAGNOSIS — E785 Hyperlipidemia, unspecified: Secondary | ICD-10-CM | POA: Diagnosis not present

## 2015-01-03 DIAGNOSIS — R1011 Right upper quadrant pain: Secondary | ICD-10-CM | POA: Diagnosis not present

## 2015-01-03 DIAGNOSIS — N185 Chronic kidney disease, stage 5: Secondary | ICD-10-CM | POA: Diagnosis present

## 2015-01-03 DIAGNOSIS — K859 Acute pancreatitis without necrosis or infection, unspecified: Secondary | ICD-10-CM | POA: Diagnosis present

## 2015-01-03 DIAGNOSIS — Z95810 Presence of automatic (implantable) cardiac defibrillator: Secondary | ICD-10-CM

## 2015-01-03 DIAGNOSIS — Z7982 Long term (current) use of aspirin: Secondary | ICD-10-CM

## 2015-01-03 DIAGNOSIS — N2581 Secondary hyperparathyroidism of renal origin: Secondary | ICD-10-CM | POA: Diagnosis not present

## 2015-01-03 DIAGNOSIS — K429 Umbilical hernia without obstruction or gangrene: Secondary | ICD-10-CM | POA: Diagnosis present

## 2015-01-03 DIAGNOSIS — N400 Enlarged prostate without lower urinary tract symptoms: Secondary | ICD-10-CM | POA: Diagnosis present

## 2015-01-03 DIAGNOSIS — K7689 Other specified diseases of liver: Secondary | ICD-10-CM | POA: Diagnosis not present

## 2015-01-03 DIAGNOSIS — Z87448 Personal history of other diseases of urinary system: Secondary | ICD-10-CM | POA: Diagnosis not present

## 2015-01-03 DIAGNOSIS — I130 Hypertensive heart and chronic kidney disease with heart failure and stage 1 through stage 4 chronic kidney disease, or unspecified chronic kidney disease: Secondary | ICD-10-CM | POA: Diagnosis present

## 2015-01-03 DIAGNOSIS — Z8249 Family history of ischemic heart disease and other diseases of the circulatory system: Secondary | ICD-10-CM

## 2015-01-03 DIAGNOSIS — Z794 Long term (current) use of insulin: Secondary | ICD-10-CM

## 2015-01-03 DIAGNOSIS — N179 Acute kidney failure, unspecified: Secondary | ICD-10-CM | POA: Diagnosis present

## 2015-01-03 DIAGNOSIS — D649 Anemia, unspecified: Secondary | ICD-10-CM | POA: Diagnosis not present

## 2015-01-03 DIAGNOSIS — I428 Other cardiomyopathies: Secondary | ICD-10-CM

## 2015-01-03 DIAGNOSIS — N184 Chronic kidney disease, stage 4 (severe): Secondary | ICD-10-CM | POA: Diagnosis not present

## 2015-01-03 DIAGNOSIS — I5042 Chronic combined systolic (congestive) and diastolic (congestive) heart failure: Secondary | ICD-10-CM | POA: Diagnosis not present

## 2015-01-03 DIAGNOSIS — R1084 Generalized abdominal pain: Secondary | ICD-10-CM | POA: Diagnosis not present

## 2015-01-03 DIAGNOSIS — G4733 Obstructive sleep apnea (adult) (pediatric): Secondary | ICD-10-CM | POA: Diagnosis present

## 2015-01-03 DIAGNOSIS — I429 Cardiomyopathy, unspecified: Secondary | ICD-10-CM | POA: Diagnosis not present

## 2015-01-03 DIAGNOSIS — Z833 Family history of diabetes mellitus: Secondary | ICD-10-CM

## 2015-01-03 LAB — COMPREHENSIVE METABOLIC PANEL
ALBUMIN: 3.8 g/dL (ref 3.5–5.0)
ALK PHOS: 132 U/L — AB (ref 38–126)
ALT: 274 U/L — AB (ref 17–63)
AST: 554 U/L — ABNORMAL HIGH (ref 15–41)
Anion gap: 15 (ref 5–15)
BILIRUBIN TOTAL: 1.5 mg/dL — AB (ref 0.3–1.2)
BUN: 96 mg/dL — ABNORMAL HIGH (ref 6–20)
CALCIUM: 9.2 mg/dL (ref 8.9–10.3)
CO2: 28 mmol/L (ref 22–32)
CREATININE: 5.05 mg/dL — AB (ref 0.61–1.24)
Chloride: 97 mmol/L — ABNORMAL LOW (ref 101–111)
GFR calc non Af Amer: 10 mL/min — ABNORMAL LOW (ref >60)
GFR, EST AFRICAN AMERICAN: 12 mL/min — AB (ref >60)
GLUCOSE: 85 mg/dL (ref 65–99)
Potassium: 3.6 mmol/L (ref 3.5–5.1)
SODIUM: 140 mmol/L (ref 135–145)
Total Protein: 6.5 g/dL (ref 6.5–8.1)

## 2015-01-03 LAB — URINALYSIS, ROUTINE W REFLEX MICROSCOPIC
BILIRUBIN URINE: NEGATIVE
Glucose, UA: NEGATIVE mg/dL
Ketones, ur: NEGATIVE mg/dL
Leukocytes, UA: NEGATIVE
Nitrite: NEGATIVE
PROTEIN: 100 mg/dL — AB
SPECIFIC GRAVITY, URINE: 1.015 (ref 1.005–1.030)
pH: 7.5 (ref 5.0–8.0)

## 2015-01-03 LAB — CBC WITH DIFFERENTIAL/PLATELET
BASOS PCT: 1 %
Basophils Absolute: 0 10*3/uL (ref 0.0–0.1)
EOS ABS: 0.4 10*3/uL (ref 0.0–0.7)
Eosinophils Relative: 7 %
HCT: 30.7 % — ABNORMAL LOW (ref 39.0–52.0)
Hemoglobin: 10.2 g/dL — ABNORMAL LOW (ref 13.0–17.0)
Lymphocytes Relative: 18 %
Lymphs Abs: 0.9 10*3/uL (ref 0.7–4.0)
MCH: 29.8 pg (ref 26.0–34.0)
MCHC: 33.2 g/dL (ref 30.0–36.0)
MCV: 89.8 fL (ref 78.0–100.0)
MONO ABS: 0.3 10*3/uL (ref 0.1–1.0)
MONOS PCT: 6 %
Neutro Abs: 3.7 10*3/uL (ref 1.7–7.7)
Neutrophils Relative %: 68 %
PLATELETS: 188 10*3/uL (ref 150–400)
RBC: 3.42 MIL/uL — ABNORMAL LOW (ref 4.22–5.81)
RDW: 15.5 % (ref 11.5–15.5)
WBC: 5.3 10*3/uL (ref 4.0–10.5)

## 2015-01-03 LAB — URINE MICROSCOPIC-ADD ON
Bacteria, UA: NONE SEEN
WBC, UA: NONE SEEN WBC/hpf (ref 0–5)

## 2015-01-03 LAB — GLUCOSE, CAPILLARY: Glucose-Capillary: 63 mg/dL — ABNORMAL LOW (ref 65–99)

## 2015-01-03 LAB — LIPASE, BLOOD: LIPASE: 1757 U/L — AB (ref 11–51)

## 2015-01-03 MED ORDER — ONDANSETRON HCL 4 MG/2ML IJ SOLN
4.0000 mg | Freq: Four times a day (QID) | INTRAMUSCULAR | Status: DC | PRN
  Administered 2015-01-09: 4 mg via INTRAVENOUS

## 2015-01-03 MED ORDER — ZOLPIDEM TARTRATE 5 MG PO TABS
5.0000 mg | ORAL_TABLET | Freq: Every evening | ORAL | Status: DC | PRN
  Administered 2015-01-04 – 2015-01-06 (×3): 5 mg via ORAL
  Filled 2015-01-03 (×4): qty 1

## 2015-01-03 MED ORDER — METOPROLOL SUCCINATE ER 25 MG PO TB24
25.0000 mg | ORAL_TABLET | Freq: Every day | ORAL | Status: DC
  Administered 2015-01-04 – 2015-01-10 (×6): 25 mg via ORAL
  Filled 2015-01-03 (×6): qty 1

## 2015-01-03 MED ORDER — HYDRALAZINE HCL 25 MG PO TABS
50.0000 mg | ORAL_TABLET | Freq: Two times a day (BID) | ORAL | Status: DC
  Administered 2015-01-03 – 2015-01-08 (×10): 50 mg via ORAL
  Filled 2015-01-03 (×10): qty 2

## 2015-01-03 MED ORDER — POTASSIUM CHLORIDE CRYS ER 20 MEQ PO TBCR
40.0000 meq | EXTENDED_RELEASE_TABLET | Freq: Once | ORAL | Status: AC
  Administered 2015-01-03: 40 meq via ORAL
  Filled 2015-01-03: qty 2

## 2015-01-03 MED ORDER — ASPIRIN EC 81 MG PO TBEC
81.0000 mg | DELAYED_RELEASE_TABLET | Freq: Every day | ORAL | Status: DC
  Administered 2015-01-04 – 2015-01-06 (×3): 81 mg via ORAL
  Filled 2015-01-03 (×3): qty 1

## 2015-01-03 MED ORDER — INSULIN ASPART 100 UNIT/ML ~~LOC~~ SOLN
0.0000 [IU] | Freq: Three times a day (TID) | SUBCUTANEOUS | Status: DC
  Administered 2015-01-04 – 2015-01-08 (×3): 1 [IU] via SUBCUTANEOUS
  Administered 2015-01-08: 3 [IU] via SUBCUTANEOUS
  Administered 2015-01-08: 2 [IU] via SUBCUTANEOUS
  Administered 2015-01-09: 1 [IU] via SUBCUTANEOUS
  Administered 2015-01-09: 2 [IU] via SUBCUTANEOUS
  Administered 2015-01-10 – 2015-01-11 (×2): 1 [IU] via SUBCUTANEOUS

## 2015-01-03 MED ORDER — MORPHINE SULFATE (PF) 2 MG/ML IV SOLN
2.0000 mg | INTRAVENOUS | Status: DC | PRN
Start: 2015-01-03 — End: 2015-01-11
  Administered 2015-01-07: 2 mg via INTRAVENOUS
  Filled 2015-01-03: qty 1

## 2015-01-03 MED ORDER — CLONIDINE HCL 0.3 MG PO TABS
0.3000 mg | ORAL_TABLET | Freq: Every day | ORAL | Status: DC
Start: 2015-01-04 — End: 2015-01-11
  Administered 2015-01-04 – 2015-01-10 (×5): 0.3 mg via ORAL
  Filled 2015-01-03 (×5): qty 1

## 2015-01-03 MED ORDER — ONDANSETRON HCL 4 MG/2ML IJ SOLN
4.0000 mg | Freq: Once | INTRAMUSCULAR | Status: AC
  Administered 2015-01-03: 4 mg via INTRAVENOUS
  Filled 2015-01-03: qty 2

## 2015-01-03 MED ORDER — MORPHINE SULFATE (PF) 4 MG/ML IV SOLN
4.0000 mg | Freq: Once | INTRAVENOUS | Status: AC
  Administered 2015-01-03: 4 mg via INTRAVENOUS
  Filled 2015-01-03: qty 1

## 2015-01-03 MED ORDER — HEPARIN SODIUM (PORCINE) 5000 UNIT/ML IJ SOLN
5000.0000 [IU] | Freq: Three times a day (TID) | INTRAMUSCULAR | Status: DC
  Administered 2015-01-03 – 2015-01-06 (×9): 5000 [IU] via SUBCUTANEOUS
  Filled 2015-01-03 (×9): qty 1

## 2015-01-03 MED ORDER — SODIUM CHLORIDE 0.9 % IV SOLN
INTRAVENOUS | Status: DC
  Administered 2015-01-03 – 2015-01-04 (×2): via INTRAVENOUS

## 2015-01-03 MED ORDER — INSULIN GLARGINE 100 UNIT/ML ~~LOC~~ SOLN
30.0000 [IU] | Freq: Every day | SUBCUTANEOUS | Status: DC
  Filled 2015-01-03 (×2): qty 0.3

## 2015-01-03 MED ORDER — ONDANSETRON HCL 4 MG PO TABS: 4.0000 mg | ORAL_TABLET | Freq: Four times a day (QID) | ORAL | Status: DC | PRN

## 2015-01-03 NOTE — ED Notes (Signed)
Patient c/o right upper abd pain that radiates into right flank and back. Patient denies any nausea, vomiting, diarrhea, or fevers. Per patient worse after drinking carbonated drink. Patient seen at urgent care and was told to come to ER for evaluation.

## 2015-01-03 NOTE — H&P (Addendum)
PCP:   [name],[name], MD   Chief Complaint:  Abdominal pain  HPI:  69 year old male who  has a past medical history of Chronic combined systolic and diastolic CHF (congestive heart failure) ([place]); Nonischemic cardiomyopathy ([place]); Hypertension; Chronic kidney disease (CKD), stage IV (severe) ([place]) (12/2009); Erectile dysfunction; Colonic polyp; Low serum testosterone level; Tobacco abuse; Benign prostatic hypertrophy; Obesity, Class I, BMI 30-34.9; Hyperlipidemia; Adenomatous colon polyp (2006); H. pylori infection (2013); Mild Ao Stenosis and Insufficiency; AICD (automatic cardioverter/defibrillator) present; Sleep apnea (2010); Diabetes mellitus type II; and Gout. Today patient came to the ED for complaints of abdominal pain which started this morning. As per patient after he had 2 cups of coffee and ate  biscuit and sausage he experienced pain in the right upper quadrant. As per patient he has been eating his breakfast for many years but never had pain before. After 3-4 hours pain eased off and patient went to urgent care and he was told that he will need ultrasound. When patient went home he drank Pepsi which brought back the pain so he came to the ED for further evaluation. Patient has a history of C KD stage V, and is followed by nephrology as outpatient. In the ED CT scan of the abdomen and pelvis was done without contrast which showed layering gallstones and questionable pneumonia in the right lung base. Patient denies any cough, no fever. No shortness of breath. No chest pain. Lab work revealed marked elevation of lipase 1757, AST , ALT elevated 554 and 274 respectively. Total bili 1.5    Allergies:   Allergies  Allergen Reactions  . Ace Inhibitors Cough      Past Medical History  Diagnosis Date  . Chronic combined systolic and diastolic CHF (congestive heart failure) ([place])     a. 02/2014 Echo: EF 20-25% (new), Gr 2 DD, mod conc LVH, mild AI/AS, mod MR, sev dil LA, mild  TR.  Marland Kitchen Nonischemic cardiomyopathy ([place])     a. 02/2014 Echo: EF 20-25%;  b. 03/2014 Cath: LM nl, LAD min irregs, LCX   . Hypertension   . Chronic kidney disease (CKD), stage IV (severe) ([place]) 12/2009    a. baseline creat now ~ 4.  . Erectile dysfunction   . Colonic polyp   . Low serum testosterone level   . Tobacco abuse   . Benign prostatic hypertrophy   . Obesity, Class I, BMI 30-34.9   . Hyperlipidemia   . Adenomatous colon polyp 2006  . H. pylori infection 2013    treated with prevpac  . Mild Ao Stenosis and Insufficiency   . AICD (automatic cardioverter/defibrillator) present     STJ device, implanted 12/03/14 Dr. Lovena Le  . Sleep apnea 2010    Initially unable to afford CPAP  . 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 Gout     Past Surgical History  Procedure Laterality Date  . Retinal laser procedure      diabetic retinopathy  . Lymph node biopsy      surgical exploration of neck-not entirely clear that this represented a lymph node biopsy  . Colonoscopy  08/23/2004    Dr. Vivi Ferns rectum, diminutive polyp of the rectosigmoid removed, inflamed focally adenomatous polyp  . Left and right heart catheterization with coronary angiogram N/A 03/19/2014    Procedure: LEFT AND RIGHT HEART CATHETERIZATION WITH CORONARY ANGIOGRAM;  Surgeon: Blane Ohara, MD;  Location: [facility] CATH LAB;  Service: Cardiovascular;  Laterality:  N/A;  . Insertion of icd  12/03/2014  . Cardiac catheterization    . Ep implantable device N/A 12/03/2014    Procedure: ICD Implant;  Surgeon: Evans Lance, MD;  Location: [facility] CV LAB;  Service: Cardiovascular;  Laterality: N/A;    Prior to Admission medications   Medication Sig Start Date End Date Taking? Authorizing Provider  aspirin EC 81 MG tablet Take 81 mg by mouth daily.   Yes Historical Provider, MD  cloNIDine (CATAPRES) 0.3 MG tablet Take 0.3 mg by mouth daily. 12/02/14  Yes Historical Provider, MD  COLCRYS 0.6 MG  tablet Take 2 tabs at first sign of gout and repeat in 1 hour if pain persist s 12/02/14  Yes Susy Frizzle, MD  hydrALAZINE (APRESOLINE) 100 MG tablet Take 0.5 tablets (50 mg total) by mouth 2 (two) times daily. 01/02/15  Yes Susy Frizzle, MD  Insulin Glargine (LANTUS SOLOSTAR) 100 UNIT/ML Solostar Pen Inject 30 Units into the skin daily at 10 pm. Patient taking differently: Inject 30 Units into the skin daily.  03/28/14  Yes Susy Frizzle, MD  metoprolol succinate (TOPROL-XL) 50 MG 24 hr tablet Take 25 mg by mouth daily. 11/06/14  Yes Historical Provider, MD  potassium chloride SA (K-DUR,KLOR-CON) 20 MEQ tablet Take 1.5 tablets (30 mEq total) by mouth 3 (three) times daily. 01/02/15  Yes Susy Frizzle, MD  torsemide (DEMADEX) 100 MG tablet Take 1 tablet (100 mg total) by mouth 2 (two) times daily. 01/02/15  Yes Susy Frizzle, MD  metolazone (ZAROXOLYN) 2.5 MG tablet Take 1 tablet by mouth three times a week as needed for swelling Patient taking differently: Take 2.5 mg by mouth as needed (Swelling).  11/20/14   Susy Frizzle, MD  ONE TOUCH ULTRA TEST test strip Check blood sugar each morning fasting and once daily 2 hours after a meal 12/10/14   Susy Frizzle, MD    Social History:  reports that he has been smoking Cigarettes.  He started smoking about 56 years ago. He has a 28 pack-year smoking history. He has never used smokeless tobacco. He reports that he does not drink alcohol or use illicit drugs.  Family History  Problem Relation Age of Onset  . Hypertension Mother   . Cancer Mother   . Cancer Father   . Diabetes Sister     Danley Danker Weights   01/03/15 1627  Weight: 91.627 kg (202 lb)    All the positives are listed in BOLD  Review of Systems:  HEENT: Headache, blurred vision, runny nose, sore throat Neck: Hypothyroidism, hyperthyroidism,,lymphadenopathy Chest : Shortness of breath, history of COPD, Asthma Heart : Chest pain, history of coronary arterey  disease GI:  Nausea, vomiting, diarrhea, constipation, GERD GU: Dysuria, urgency, frequency of urination, hematuria Neuro: Stroke, seizures, syncope Psych: Depression, anxiety, hallucinations   Physical Exam: Blood pressure 164/59, pulse 76, temperature 97.9 F (36.6 C), temperature source Oral, resp. rate 12, height 5\' 9"  (1.753 m), weight 91.627 kg (202 lb), SpO2 95 %. Constitutional:   Patient is a well-developed and well-nourished male in no acute distress and cooperative with exam. Head: Normocephalic and atraumatic Mouth: Mucus membranes moist Eyes: PERRL, EOMI, conjunctivae normal Neck: Supple, No Thyromegaly Cardiovascular: RRR, S1 normal, S2 normal Pulmonary/Chest: CTAB, no wheezes, rales, or rhonchi Abdominal: Soft. Tenderness in RUQ, non-distended, bowel sounds are normal, no masses, organomegaly, or guarding present.  Neurological: A&O x3, Strength is normal and symmetric bilaterally, cranial nerve II-XII are grossly intact,  no focal motor deficit, sensory intact to light touch bilaterally.  Extremities : No Cyanosis, Clubbing or Edema  Labs on Admission:  Basic Metabolic Panel:  Recent Labs Lab 01/01/15 1052 01/03/15 1707  NA 139 140  K 3.4* 3.6  CL 100 97*  CO2 26 28  GLUCOSE 137* 85  BUN 93* 96*  CREATININE 5.14* 5.05*  CALCIUM 8.5* 9.2   Liver Function Tests:  Recent Labs Lab 01/03/15 1707  AST 554*  ALT 274*  ALKPHOS 132*  BILITOT 1.5*  PROT 6.5  ALBUMIN 3.8    Recent Labs Lab 01/03/15 1707  LIPASE 1757*   No results for input(s): AMMONIA in the last 168 hours. CBC:  Recent Labs Lab 01/03/15 1707  WBC 5.3  NEUTROABS 3.7  HGB 10.2*  HCT 30.7*  MCV 89.8  PLT 188   Cardiac Enzymes: No results for input(s): CKTOTAL, CKMB, CKMBINDEX, TROPONINI in the last 168 hours.  BNP (last 3 results)  Recent Labs  01/22/14 2302  BNP >4500.0*     Radiological Exams on Admission: Ct Abdomen Pelvis Wo Contrast  01/03/2015  CLINICAL DATA:   Right upper and right lower quadrant pain. Pain radiates to the flank and back. EXAM: CT ABDOMEN AND PELVIS WITHOUT CONTRAST TECHNIQUE: Multidetector CT imaging of the abdomen and pelvis was performed following the standard protocol without IV contrast. COMPARISON:  Renal ultrasound 05/21/2014 FINDINGS: Lower chest: Small focal parenchymal opacity in the posterior lateral right lower lobe. Minimal pleural thickening bilaterally. Punctate granuloma in the right lower lobe. Multi chamber cardiomegaly. Pacemaker wires are partially included. Liver: Scattered hypodense lesions in the liver, majority sub centimeter and too small to accurately characterize, largest in the left lobe measures 3.7 cm. Hepatobiliary: Layering gallstones within the gallbladder. Gallbladder physiologically distended. No pericholecystic inflammation. No evidence of biliary dilatation, however common bile duct is suboptimally defined. Pancreas: No ductal dilatation or surrounding inflammation. Spleen: No focal lesion. Adrenal glands: No nodule. Kidneys: No hydronephrosis or urolithiasis. Multiple bilateral renal lesions, majority small in size and representing simple cysts. However, there is a hyperdense 9 mm lesion in the medial mid left kidney, with a complex appearing 2 cm hypodense lesion in the left mid upper pole. Stomach/Bowel: Stomach physiologically distended. There are no dilated or thickened small bowel loops. Small volume of stool throughout the colon without colonic wall thickening. The appendix is normal. Vascular/Lymphatic: No retroperitoneal adenopathy. Abdominal aorta is normal in caliber. Moderate atherosclerosis and tortuosity of the abdominal aorta without aneurysm. Reproductive: Prostate gland normal in size, central prostatic calcification. Bladder: Physiologically distended, no wall thickening. Other: No free air, free fluid, or intra-abdominal fluid collection. Small fat containing umbilical hernia. Musculoskeletal: There  are no acute or suspicious osseous abnormalities. Degenerative change in the lumbar spine. IMPRESSION: 1. Small peripheral consolidation in the right lung base, this abuts the pleura and may be the cause of patient's right-sided pain. This likely represent small focal pneumonia. Chest radiograph correlation may be helpful, particularly for follow-up which is recommended in 3-4 weeks. 2. No acute intra-abdominal/pelvic abnormality. 3. Bilateral renal lesions, incompletely characterized without contrast. Majority of these are small and appear to represent cysts. Question of complex cystic lesion in the left kidney, corresponding to that seen on prior ultrasound. Additionally there is a hyperdense subcentimeter lesion in the left kidney. Nonemergent renal protocol MRI with and with contrast is recommended for further characterization. Electronically Signed   By: Jeb Levering M.D.   On: 01/03/2015 19:16    EKG: Independently reviewed.  Normal sinus rhthm   Assessment/Plan Active Problems:   Diabetes mellitus type 2 with retinopathy (HCC)   Hypokalemia   HTN (hypertension)   Nonischemic cardiomyopathy (HCC)   Ventricular tachycardia (HCC)   Gallstone pancreatitis  Gallstone pancreatitis Patient presenting with elevated lipase and LFTs with right upper quadrant pain. CT scan showing layering gallstones but no inflamed pancreas. Will admit the patient, start gentle IV fluids with normal saline at 50 mL per hour, due to underlying C KD stage V. Follow lipase in a.m. Will consult gastroenterology in a.m. Keep him nothing by mouth, morphine when necessary for pain, Zofran for nausea and vomiting. Patient might need MRCP versus ERCP as per discretion of GI.  ? Pneumonia CT abdomen also shows possible infiltrate in right lung base, patient is not having any signs and symptoms of pneumonia at this time. Will not initiate antibiotics and closely monitor the patient in the hospital.  Diabetes  mellitus Continue Lantus, start sliding scale insulin with NovoLog.  Hypertension Patient on multiple antihypertensive medications, continue clonidine, hydralazine Blood pressure well controlled.  CKD stage V Patient history of C KD stage V, not on dialysis. Followed by nephrology as outpatient Will hold Demadex and metolazone at this time Follow kidney functions in a.m.  Nonischemic cardio myopathy Patient is status post defibrillator placement Last echo showed EF of 20-25% and grade 2 diastolic dysfunction We'll need to closely  monitor the patient in hospital for fluid overload  DVT prophylaxis Heparin  Code status: Full code  Family discussion: Admission, patients condition and plan of care including tests being ordered have been discussed with the patient and his wife at bedside who indicate understanding and agree with the plan and Code Status.   Time Spent on Admission: 60 min  [facility] Pager: [phone] 01/03/2015, 8:13 PM  If 7PM-7AM, please contact night-coverage  [email or web address]  Password [email or web address]

## 2015-01-03 NOTE — ED Provider Notes (Signed)
CSN: [id number]     Arrival date & time 01/03/15  1620 History   First MD Initiated Contact with Patient 01/03/15 1632     Chief Complaint  Patient presents with  . Abdominal Pain    Patient is a 69 y.o. male presenting with abdominal pain. The history is provided by the patient and the spouse.  Abdominal Pain Pain location:  RUQ and RLQ Pain quality: aching   Pain severity:  Moderate Onset quality:  Gradual Duration:  1 day Timing:  Intermittent Progression:  Worsening Chronicity:  New Relieved by:  Nothing Worsened by:  Movement, palpation and eating Associated symptoms: no chest pain, no diarrhea, no dysuria, no fever and no vomiting   PT REPORTS INTERMITTENT PAIN IN RUQ/RLQ TODAY IT OCCURRED THIS MORNING, HE WENT TO AN URGENT CARE AND WAS TOLD HE NEEDED AN ULTRASOUND HE FELT IMPROVED AFTER THAT VISIT BUT THEN PAIN RETURNED AFTER DRINKING DR PEPPER HE HAS NEVER HAD THIS BEFORE  HE HAS AN ICD IN PLACE BUT NO RECENT ISSUES  Past Medical History  Diagnosis Date  . Chronic combined systolic and diastolic CHF (congestive heart failure) ([place])     a. 02/2014 Echo: EF 20-25% (new), Gr 2 DD, mod conc LVH, mild AI/AS, mod MR, sev dil LA, mild TR.  Marland Kitchen Nonischemic cardiomyopathy ([place])     a. 02/2014 Echo: EF 20-25%;  b. 03/2014 Cath: LM nl, LAD min irregs, LCX   . Hypertension   . Chronic kidney disease (CKD), stage IV (severe) ([place]) 12/2009    a. baseline creat now ~ 4.  . Erectile dysfunction   . Colonic polyp   . Low serum testosterone level   . Tobacco abuse   . Benign prostatic hypertrophy   . Obesity, Class I, BMI 30-34.9   . Hyperlipidemia   . Adenomatous colon polyp 2006  . H. pylori infection 2013    treated with prevpac  . Mild Ao Stenosis and Insufficiency   . AICD (automatic cardioverter/defibrillator) present     STJ device, implanted 12/03/14 Dr. Lovena Le  . Sleep apnea 2010    Initially unable to afford CPAP  . Diabetes mellitus type II     with retiopathy and  nephropathy   . Gout    Past Surgical History  Procedure Laterality Date  . Retinal laser procedure      diabetic retinopathy  . Lymph node biopsy      surgical exploration of neck-not entirely clear that this represented a lymph node biopsy  . Colonoscopy  08/23/2004    Dr. Vivi Ferns rectum, diminutive polyp of the rectosigmoid removed, inflamed focally adenomatous polyp  . Left and right heart catheterization with coronary angiogram N/A 03/19/2014    Procedure: LEFT AND RIGHT HEART CATHETERIZATION WITH CORONARY ANGIOGRAM;  Surgeon: Blane Ohara, MD;  Location: [facility] CATH LAB;  Service: Cardiovascular;  Laterality: N/A;  . Insertion of icd  12/03/2014  . Cardiac catheterization    . Ep implantable device N/A 12/03/2014    Procedure: ICD Implant;  Surgeon: Evans Lance, MD;  Location: [facility] CV LAB;  Service: Cardiovascular;  Laterality: N/A;   Family History  Problem Relation Age of Onset  . Hypertension Mother   . Cancer Mother   . Cancer Father   . Diabetes Sister    Social History  Substance Use Topics  . Smoking status: Current Every Day Smoker -- 0.50 packs/day for 56 years    Types: Cigarettes    Start date: 05/31/1958  .  Smokeless tobacco: Never Used  . Alcohol Use: No    Review of Systems  Constitutional: Negative for fever.  Cardiovascular: Negative for chest pain.  Gastrointestinal: Positive for abdominal pain. Negative for vomiting and diarrhea.  Genitourinary: Negative for dysuria and testicular pain.  All other systems reviewed and are negative.     Allergies  Ace inhibitors  Home Medications   Prior to Admission medications   Medication Sig Start Date End Date Taking? Authorizing Provider  aspirin EC 81 MG tablet Take 81 mg by mouth daily.   Yes Historical Provider, MD  cloNIDine (CATAPRES) 0.3 MG tablet Take 0.3 mg by mouth daily. 12/02/14  Yes Historical Provider, MD  COLCRYS 0.6 MG tablet Take 2 tabs at first sign of gout and repeat in 1  hour if pain persist s 12/02/14  Yes Susy Frizzle, MD  hydrALAZINE (APRESOLINE) 100 MG tablet Take 0.5 tablets (50 mg total) by mouth 2 (two) times daily. 01/02/15  Yes Susy Frizzle, MD  Insulin Glargine (LANTUS SOLOSTAR) 100 UNIT/ML Solostar Pen Inject 30 Units into the skin daily at 10 pm. Patient taking differently: Inject 30 Units into the skin daily.  03/28/14  Yes Susy Frizzle, MD  metoprolol succinate (TOPROL-XL) 50 MG 24 hr tablet Take 25 mg by mouth daily. 11/06/14  Yes Historical Provider, MD  potassium chloride SA (K-DUR,KLOR-CON) 20 MEQ tablet Take 1.5 tablets (30 mEq total) by mouth 3 (three) times daily. 01/02/15  Yes Susy Frizzle, MD  torsemide (DEMADEX) 100 MG tablet Take 1 tablet (100 mg total) by mouth 2 (two) times daily. 01/02/15  Yes Susy Frizzle, MD  metolazone (ZAROXOLYN) 2.5 MG tablet Take 1 tablet by mouth three times a week as needed for swelling Patient taking differently: Take 2.5 mg by mouth as needed (Swelling).  11/20/14   Susy Frizzle, MD  ONE TOUCH ULTRA TEST test strip Check blood sugar each morning fasting and once daily 2 hours after a meal 12/10/14   Susy Frizzle, MD   BP 171/62 mmHg  Pulse 80  Temp(Src) 97.9 F (36.6 C) (Oral)  Ht 5\' 9"  (1.753 m)  Wt 91.627 kg  BMI 29.82 kg/m2  SpO2 100% Physical Exam CONSTITUTIONAL: Well developed/well nourished HEAD: Normocephalic/atraumatic EYES: EOMI ENMT: Mucous membranes moist NECK: supple no meningeal signs SPINE/BACK:entire spine nontender CV: S1/S2 noted, no murmurs/rubs/gallops noted LUNGS: Lungs are clear to auscultation bilaterally, no apparent distress ABDOMEN: soft, moderate tenderness in RUQ and RLQ, no rebound or guarding, bowel sounds noted throughout abdomen GU:no cva tenderness NEURO: Pt is awake/alert/appropriate, moves all extremitiesx4.   EXTREMITIES: pulses normal/equal, full ROM SKIN: warm, color normal PSYCH: no abnormalities of mood noted, alert and oriented to  situation  ED Course  Procedures  5:24 PM Pt with h/o chronic renal failure (he is not on dialysis) Now having intermittent RUQ/RLQ abd pain Will need imaging CT without IV contrast ordered 7:55 PM Probable gallstone pancreatitis (d/w radiology, CT findings of pancreatitis may be delayed but lipase elevated) D/w dr Elta Gu[place] Arnoldo Morale he recommends admit to medicine D/w dr Darrick Meigs, consulted via phone and will admit.  He will likely require GI consultation and possible US imaging.  CT findings reveal ?pneumonia but pt has no symptoms at this time Pt is comfortable at this time  Labs Review Labs Reviewed  COMPREHENSIVE METABOLIC PANEL - Abnormal; Notable for the following:    Chloride 97 (*)    BUN 96 (*)    Creatinine, Ser 5.05 (*)  AST 554 (*)    ALT 274 (*)    Alkaline Phosphatase 132 (*)    Total Bilirubin 1.5 (*)    GFR calc non Af Amer 10 (*)    GFR calc Af Amer 12 (*)    All other components within normal limits  CBC WITH DIFFERENTIAL/PLATELET - Abnormal; Notable for the following:    RBC 3.42 (*)    Hemoglobin 10.2 (*)    HCT 30.7 (*)    All other components within normal limits  LIPASE, BLOOD - Abnormal; Notable for the following:    Lipase 1757 (*)    All other components within normal limits  URINALYSIS, ROUTINE W REFLEX MICROSCOPIC (NOT AT [facility]) - Abnormal; Notable for the following:    Hgb urine dipstick TRACE (*)    Protein, ur 100 (*)    All other components within normal limits  URINE MICROSCOPIC-ADD ON - Abnormal; Notable for the following:    Squamous Epithelial / LPF 0-5 (*)    All other components within normal limits    Imaging Review Ct Abdomen Pelvis Wo Contrast  01/03/2015  CLINICAL DATA:  Right upper and right lower quadrant pain. Pain radiates to the flank and back. EXAM: CT ABDOMEN AND PELVIS WITHOUT CONTRAST TECHNIQUE: Multidetector CT imaging of the abdomen and pelvis was performed following the standard protocol without IV contrast. COMPARISON:   Renal ultrasound 05/21/2014 FINDINGS: Lower chest: Small focal parenchymal opacity in the posterior lateral right lower lobe. Minimal pleural thickening bilaterally. Punctate granuloma in the right lower lobe. Multi chamber cardiomegaly. Pacemaker wires are partially included. Liver: Scattered hypodense lesions in the liver, majority sub centimeter and too small to accurately characterize, largest in the left lobe measures 3.7 cm. Hepatobiliary: Layering gallstones within the gallbladder. Gallbladder physiologically distended. No pericholecystic inflammation. No evidence of biliary dilatation, however common bile duct is suboptimally defined. Pancreas: No ductal dilatation or surrounding inflammation. Spleen: No focal lesion. Adrenal glands: No nodule. Kidneys: No hydronephrosis or urolithiasis. Multiple bilateral renal lesions, majority small in size and representing simple cysts. However, there is a hyperdense 9 mm lesion in the medial mid left kidney, with a complex appearing 2 cm hypodense lesion in the left mid upper pole. Stomach/Bowel: Stomach physiologically distended. There are no dilated or thickened small bowel loops. Small volume of stool throughout the colon without colonic wall thickening. The appendix is normal. Vascular/Lymphatic: No retroperitoneal adenopathy. Abdominal aorta is normal in caliber. Moderate atherosclerosis and tortuosity of the abdominal aorta without aneurysm. Reproductive: Prostate gland normal in size, central prostatic calcification. Bladder: Physiologically distended, no wall thickening. Other: No free air, free fluid, or intra-abdominal fluid collection. Small fat containing umbilical hernia. Musculoskeletal: There are no acute or suspicious osseous abnormalities. Degenerative change in the lumbar spine. IMPRESSION: 1. Small peripheral consolidation in the right lung base, this abuts the pleura and may be the cause of patient's right-sided pain. This likely represent small  focal pneumonia. Chest radiograph correlation may be helpful, particularly for follow-up which is recommended in 3-4 weeks. 2. No acute intra-abdominal/pelvic abnormality. 3. Bilateral renal lesions, incompletely characterized without contrast. Majority of these are small and appear to represent cysts. Question of complex cystic lesion in the left kidney, corresponding to that seen on prior ultrasound. Additionally there is a hyperdense subcentimeter lesion in the left kidney. Nonemergent renal protocol MRI with and with contrast is recommended for further characterization. Electronically Signed   By: Jeb Levering M.D.   On: 01/03/2015 19:16   I have  personally reviewed and evaluated these lab results as part of my medical decision-making.   EKG Interpretation   Date/Time:  Saturday January 03 2015 17:11:55 EST Ventricular Rate:  76 PR Interval:  164 QRS Duration: 127 QT Interval:  447 QTC Calculation: 503 R Axis:   45 Text Interpretation:  Sinus rhythm Probable left atrial enlargement Left  bundle branch block Abnormal ekg Confirmed by Christy Gentles  MD, Jhonny ([id number])  on 01/03/2015 5:17:49 PM     Medications  morphine 4 MG/ML injection 4 mg (4 mg Intravenous Given 01/03/15 1705)  ondansetron (ZOFRAN) injection 4 mg (4 mg Intravenous Given 01/03/15 1705)     MDM   Final diagnoses:  Gallstone pancreatitis    Nursing notes including past medical history and social history reviewed and considered in documentation Labs/vital reviewed myself and considered during evaluation     Ripley Fraise, MD 01/03/15 [phone]

## 2015-01-04 DIAGNOSIS — D649 Anemia, unspecified: Secondary | ICD-10-CM | POA: Diagnosis not present

## 2015-01-04 DIAGNOSIS — E876 Hypokalemia: Secondary | ICD-10-CM | POA: Diagnosis not present

## 2015-01-04 DIAGNOSIS — E11311 Type 2 diabetes mellitus with unspecified diabetic retinopathy with macular edema: Secondary | ICD-10-CM | POA: Diagnosis not present

## 2015-01-04 DIAGNOSIS — K7689 Other specified diseases of liver: Secondary | ICD-10-CM | POA: Diagnosis not present

## 2015-01-04 DIAGNOSIS — I10 Essential (primary) hypertension: Secondary | ICD-10-CM | POA: Diagnosis not present

## 2015-01-04 DIAGNOSIS — K851 Biliary acute pancreatitis without necrosis or infection: Secondary | ICD-10-CM | POA: Diagnosis not present

## 2015-01-04 DIAGNOSIS — I429 Cardiomyopathy, unspecified: Secondary | ICD-10-CM | POA: Diagnosis not present

## 2015-01-04 LAB — COMPREHENSIVE METABOLIC PANEL
ALBUMIN: 3.3 g/dL — AB (ref 3.5–5.0)
ALT: 215 U/L — AB (ref 17–63)
AST: 178 U/L — AB (ref 15–41)
Alkaline Phosphatase: 123 U/L (ref 38–126)
Anion gap: 12 (ref 5–15)
BUN: 91 mg/dL — AB (ref 6–20)
CHLORIDE: 99 mmol/L — AB (ref 101–111)
CO2: 27 mmol/L (ref 22–32)
Calcium: 8.7 mg/dL — ABNORMAL LOW (ref 8.9–10.3)
Creatinine, Ser: 5.12 mg/dL — ABNORMAL HIGH (ref 0.61–1.24)
GFR calc Af Amer: 12 mL/min — ABNORMAL LOW (ref >60)
GFR, EST NON AFRICAN AMERICAN: 10 mL/min — AB (ref >60)
GLUCOSE: 79 mg/dL (ref 65–99)
POTASSIUM: 3.6 mmol/L (ref 3.5–5.1)
SODIUM: 138 mmol/L (ref 135–145)
Total Bilirubin: 0.5 mg/dL (ref 0.3–1.2)
Total Protein: 6 g/dL — ABNORMAL LOW (ref 6.5–8.1)

## 2015-01-04 LAB — GLUCOSE, CAPILLARY
GLUCOSE-CAPILLARY: 88 mg/dL (ref 65–99)
GLUCOSE-CAPILLARY: 122 mg/dL — AB (ref 65–99)
GLUCOSE-CAPILLARY: 106 mg/dL — AB (ref 65–99)
GLUCOSE-CAPILLARY: 110 mg/dL — AB (ref 65–99)
GLUCOSE-CAPILLARY: 99 mg/dL (ref 65–99)
Glucose-Capillary: 47 mg/dL — ABNORMAL LOW (ref 65–99)

## 2015-01-04 LAB — LIPASE, BLOOD: LIPASE: 312 U/L — AB (ref 11–51)

## 2015-01-04 LAB — CBC
HCT: 28.1 % — ABNORMAL LOW (ref 39.0–52.0)
Hemoglobin: 9.4 g/dL — ABNORMAL LOW (ref 13.0–17.0)
MCH: 29.9 pg (ref 26.0–34.0)
MCHC: 33.5 g/dL (ref 30.0–36.0)
MCV: 89.5 fL (ref 78.0–100.0)
PLATELETS: 161 10*3/uL (ref 150–400)
RBC: 3.14 MIL/uL — AB (ref 4.22–5.81)
RDW: 15.4 % (ref 11.5–15.5)
WBC: 5.3 10*3/uL (ref 4.0–10.5)

## 2015-01-04 MED ORDER — ALBUTEROL SULFATE (2.5 MG/3ML) 0.083% IN NEBU
INHALATION_SOLUTION | RESPIRATORY_TRACT | Status: AC
  Administered 2015-01-04: 2.5 mg
  Filled 2015-01-04: qty 3

## 2015-01-04 MED ORDER — TORSEMIDE 20 MG PO TABS
100.0000 mg | ORAL_TABLET | Freq: Once | ORAL | Status: AC
  Administered 2015-01-04: 100 mg via ORAL
  Filled 2015-01-04: qty 5

## 2015-01-04 MED ORDER — INSULIN GLARGINE 100 UNIT/ML ~~LOC~~ SOLN
30.0000 [IU] | Freq: Every day | SUBCUTANEOUS | Status: DC
  Administered 2015-01-05: 30 [IU] via SUBCUTANEOUS
  Filled 2015-01-04 (×3): qty 0.3

## 2015-01-04 NOTE — Progress Notes (Signed)
[facility] PROGRESS NOTE  [name] [id number] DOB: 1945-04-06 DOA: 01/03/2015 PCP: Jenna Luo TOM, MD  Assessment/Plan: Gallstone pancreatitis -We will advance diet to clear liquids. -He states abdominal pain is improved. His LFTs have decreased significantly. -Seen by GI and surgery. -No plan for ERCP at present. -Abdominal ultrasound ordered and is pending. -We'll likely need a cholecystectomy, may need to be transferred to a tertiary care center given his multiple medical comorbidities. We'll let surgery make final determination of timing and location of surgery.  Pneumonia -CT abdomen shows infiltrate of the right lung base, however patient isn't having any signs and symptoms of pneumonia at this time and as such will not initiate antibiotics.  Diabetes mellitus -Had some hypoglycemic episodes earlier today. -Will improve now that diet has been initiated.  Hypertension -We'll controlled, continue current medications  Chronic kidney disease stage V -Not on dialysis. -Continue outpatient follow-up with nephrology.  Nonischemic cardiomyopathy  -most recent echo showed an ejection fraction of 20-25% and grade 2 diastolic dysfunction. -No signs of volume overload at present. -Patient is status post AICD placement.  Code Status: Full code  Family Communication: Patient only  Disposition Plan: To be determined  Consultants:  Dr. Laural Golden, GI   Dr. Arnoldo Morale, surgery  Antibiotics:  None   Subjective: Abdominal pain improved   Objective: Filed Vitals:   01/03/15 2135 01/04/15 0641 01/04/15 0904 01/04/15 0917  BP: 177/55 163/58 161/69 161/69  Pulse: 83 82 82   Temp: 98.4 F (36.9 C) 99.2 F (37.3 C)    TempSrc: Oral Oral    Resp: 14 16    Height:      Weight: 89.086 kg (196 lb 6.4 oz)     SpO2: 94% 100%      Intake/Output Summary (Last 24 hours) at 01/04/15 1221 Last data filed at 01/04/15 0800  Gross per 24 hour  Intake    240 ml    Output      0 ml  Net    240 ml   Filed Weights   01/03/15 1627 01/03/15 2135  Weight: 91.627 kg (202 lb) 89.086 kg (196 lb 6.4 oz)    Exam:   General:  Alert, awake, oriented 3, no current distress   Cardiovascular: Regular rate and rhythm   Respiratory: Clear to auscultation bilaterally   Abdomen: Soft, slightly tender to palpation to the right upper quadrant   Extremities: No clubbing, cyanosis or edema  Neurologic:  Grossly intact and nonfocal  Data Reviewed: Basic Metabolic Panel:  Recent Labs Lab 01/01/15 1052 01/03/15 1707 01/04/15 0622  NA 139 140 138  K 3.4* 3.6 3.6  CL 100 97* 99*  CO2 26 28 27   GLUCOSE 137* 85 79  BUN 93* 96* 91*  CREATININE 5.14* 5.05* 5.12*  CALCIUM 8.5* 9.2 8.7*   Liver Function Tests:  Recent Labs Lab 01/03/15 1707 01/04/15 0622  AST 554* 178*  ALT 274* 215*  ALKPHOS 132* 123  BILITOT 1.5* 0.5  PROT 6.5 6.0*  ALBUMIN 3.8 3.3*    Recent Labs Lab 01/03/15 1707 01/04/15 0622  LIPASE 1757* 312*   No results for input(s): AMMONIA in the last 168 hours. CBC:  Recent Labs Lab 01/03/15 1707 01/04/15 0622  WBC 5.3 5.3  NEUTROABS 3.7  --   HGB 10.2* 9.4*  HCT 30.7* 28.1*  MCV 89.8 89.5  PLT 188 161   Cardiac Enzymes: No results for input(s): CKTOTAL, CKMB, CKMBINDEX, TROPONINI in the last 168 hours. BNP (last  3 results)  Recent Labs  01/22/14 2302  BNP >4500.0*    ProBNP (last 3 results) No results for input(s): PROBNP in the last 8760 hours.  CBG:  Recent Labs Lab 01/03/15 2052 01/04/15 0527 01/04/15 0639 01/04/15 0730 01/04/15 1106  GLUCAP 63* 47* 88 122* 106*    No results found for this or any previous visit (from the past 240 hour(s)).   Studies: Ct Abdomen Pelvis Wo Contrast  01/03/2015  CLINICAL DATA:  Right upper and right lower quadrant pain. Pain radiates to the flank and back. EXAM: CT ABDOMEN AND PELVIS WITHOUT CONTRAST TECHNIQUE: Multidetector CT imaging of the abdomen and  pelvis was performed following the standard protocol without IV contrast. COMPARISON:  Renal ultrasound 05/21/2014 FINDINGS: Lower chest: Small focal parenchymal opacity in the posterior lateral right lower lobe. Minimal pleural thickening bilaterally. Punctate granuloma in the right lower lobe. Multi chamber cardiomegaly. Pacemaker wires are partially included. Liver: Scattered hypodense lesions in the liver, majority sub centimeter and too small to accurately characterize, largest in the left lobe measures 3.7 cm. Hepatobiliary: Layering gallstones within the gallbladder. Gallbladder physiologically distended. No pericholecystic inflammation. No evidence of biliary dilatation, however common bile duct is suboptimally defined. Pancreas: No ductal dilatation or surrounding inflammation. Spleen: No focal lesion. Adrenal glands: No nodule. Kidneys: No hydronephrosis or urolithiasis. Multiple bilateral renal lesions, majority small in size and representing simple cysts. However, there is a hyperdense 9 mm lesion in the medial mid left kidney, with a complex appearing 2 cm hypodense lesion in the left mid upper pole. Stomach/Bowel: Stomach physiologically distended. There are no dilated or thickened small bowel loops. Small volume of stool throughout the colon without colonic wall thickening. The appendix is normal. Vascular/Lymphatic: No retroperitoneal adenopathy. Abdominal aorta is normal in caliber. Moderate atherosclerosis and tortuosity of the abdominal aorta without aneurysm. Reproductive: Prostate gland normal in size, central prostatic calcification. Bladder: Physiologically distended, no wall thickening. Other: No free air, free fluid, or intra-abdominal fluid collection. Small fat containing umbilical hernia. Musculoskeletal: There are no acute or suspicious osseous abnormalities. Degenerative change in the lumbar spine. IMPRESSION: 1. Small peripheral consolidation in the right lung base, this abuts the  pleura and may be the cause of patient's right-sided pain. This likely represent small focal pneumonia. Chest radiograph correlation may be helpful, particularly for follow-up which is recommended in 3-4 weeks. 2. No acute intra-abdominal/pelvic abnormality. 3. Bilateral renal lesions, incompletely characterized without contrast. Majority of these are small and appear to represent cysts. Question of complex cystic lesion in the left kidney, corresponding to that seen on prior ultrasound. Additionally there is a hyperdense subcentimeter lesion in the left kidney. Nonemergent renal protocol MRI with and with contrast is recommended for further characterization. Electronically Signed   By: Jeb Levering M.D.   On: 01/03/2015 19:16    Scheduled Meds: . aspirin EC  81 mg Oral Daily  . cloNIDine  0.3 mg Oral Daily  . heparin subcutaneous  5,000 Units Subcutaneous 3 times per day  . hydrALAZINE  50 mg Oral BID  . insulin aspart  0-9 Units Subcutaneous TID WC  . insulin glargine  30 Units Subcutaneous Daily  . metoprolol succinate  25 mg Oral Daily   Continuous Infusions: . sodium chloride 50 mL/hr at 01/03/15 2100    Active Problems:   Diabetes mellitus type 2 with retinopathy ([place])   Hypokalemia   HTN (hypertension)   Nonischemic cardiomyopathy (HCC)   Ventricular tachycardia (HCC)   Gallstone pancreatitis  Pancreatitis    Time spent: 25 minutes.  Greater than 50% of this time was spent in direct contact with the patient coordinating care.    Lelon Frohlich  [facility] Pager [phone]  If 7PM-7AM, please contact night-coverage at [email or web address], password [facility] 01/04/2015, 12:21 PM  LOS: 1 day

## 2015-01-04 NOTE — Progress Notes (Addendum)
Patient placed on 2 lpm for sob , he became dyspneic had not had his metolazone fluid pill. Patient also called back later stating he still fills tight in chest. He is now beginning to wheeze.He has had his fluid medicine . He also has long history of smoking and still smokes occasionally.  Will start on prn albuterol nebs. Suspect this mostly related to his fluid problem.

## 2015-01-04 NOTE — Consult Note (Signed)
Reason for Consult: Gallstone pancreatitis Referring Physician: Hospitalist  [name] is an 69 y.o. male.  HPI: Patient is a 69 year old black male with multiple medical problems including chronic renal disease, cardiomyopathy, history of CHF, status post AICD placement in November 2016 who presents with intermittent epigastric pain. He initially was seen at an urgent care center and then subsequently was brought to the emergency room. He was found to have elevated liver enzyme tests as well as an elevated lipase. CT scan reveals cholelithiasis and minimal evidence of pancreatitis. He was admitted to the hospital for further evaluation treatment. He states that this is his first episode. He currently feels fine and denies any nausea, vomiting, or abdominal pain.  Past Medical History  Diagnosis Date  . Chronic combined systolic and diastolic CHF (congestive heart failure) ([place])     a. 02/2014 Echo: EF 20-25% (new), Gr 2 DD, mod conc LVH, mild AI/AS, mod MR, sev dil LA, mild TR.  Marland Kitchen Nonischemic cardiomyopathy ([place])     a. 02/2014 Echo: EF 20-25%;  b. 03/2014 Cath: LM nl, LAD min irregs, LCX   . Hypertension   . Chronic kidney disease (CKD), stage IV (severe) ([place]) 12/2009    a. baseline creat now ~ 4.  . Erectile dysfunction   . Colonic polyp   . Low serum testosterone level   . Tobacco abuse   . Benign prostatic hypertrophy   . Obesity, Class I, BMI 30-34.9   . Hyperlipidemia   . Adenomatous colon polyp 2006  . H. pylori infection 2013    treated with prevpac  . Mild Ao Stenosis and Insufficiency   . AICD (automatic cardioverter/defibrillator) present     STJ device, implanted 12/03/14 Dr. Lovena Le  . Sleep apnea 2010    Initially unable to afford CPAP  . 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 Gout     Past Surgical History  Procedure Laterality Date  . Retinal laser procedure      diabetic retinopathy  . Lymph node biopsy      surgical exploration of  neck-not entirely clear that this represented a lymph node biopsy  . Colonoscopy  08/23/2004    Dr. Vivi Ferns rectum, diminutive polyp of the rectosigmoid removed, inflamed focally adenomatous polyp  . Left and right heart catheterization with coronary angiogram N/A 03/19/2014    Procedure: LEFT AND RIGHT HEART CATHETERIZATION WITH CORONARY ANGIOGRAM;  Surgeon: Blane Ohara, MD;  Location: [facility] CATH LAB;  Service: Cardiovascular;  Laterality: N/A;  . Insertion of icd  12/03/2014  . Cardiac catheterization    . Ep implantable device N/A 12/03/2014    Procedure: ICD Implant;  Surgeon: Evans Lance, MD;  Location: [facility] CV LAB;  Service: Cardiovascular;  Laterality: N/A;    Family History  Problem Relation Age of Onset  . Hypertension Mother   . Cancer Mother   . Cancer Father   . Diabetes Sister     Social History:  reports that he has been smoking Cigarettes.  He started smoking about 56 years ago. He has a 28 pack-year smoking history. He has never used smokeless tobacco. He reports that he does not drink alcohol or use illicit drugs.  Allergies:  Allergies  Allergen Reactions  . Ace Inhibitors Cough    Medications: I have reviewed the patient's current medications.  Results for orders placed or performed during the hospital encounter of 01/03/15 (from the past 48 hour(s))  Comprehensive metabolic panel     Status: Abnormal   Collection Time: 01/03/15  5:07 PM  Result Value Ref Range   Sodium 140 135 - 145 mmol/L   Potassium 3.6 3.5 - 5.1 mmol/L   Chloride 97 (L) 101 - 111 mmol/L   CO2 28 22 - 32 mmol/L   Glucose, Bld 85 65 - 99 mg/dL   BUN 96 (H) 6 - 20 mg/dL   Creatinine, Ser 5.05 (H) 0.61 - 1.24 mg/dL   Calcium 9.2 8.9 - 10.3 mg/dL   Total Protein 6.5 6.5 - 8.1 g/dL   Albumin 3.8 3.5 - 5.0 g/dL   AST 554 (H) 15 - 41 U/L   ALT 274 (H) 17 - 63 U/L   Alkaline Phosphatase 132 (H) 38 - 126 U/L   Total Bilirubin 1.5 (H) 0.3 - 1.2 mg/dL   GFR calc non Af Amer 10  (L) >60 mL/min   GFR calc Af Amer 12 (L) >60 mL/min    Comment: (NOTE) The eGFR has been calculated using the CKD EPI equation. This calculation has not been validated in all clinical situations. eGFR's persistently <60 mL/min signify possible Chronic Kidney Disease.    Anion gap 15 5 - 15  CBC with Differential/Platelet     Status: Abnormal   Collection Time: 01/03/15  5:07 PM  Result Value Ref Range   WBC 5.3 4.0 - 10.5 K/uL   RBC 3.42 (L) 4.22 - 5.81 MIL/uL   Hemoglobin 10.2 (L) 13.0 - 17.0 g/dL   HCT 30.7 (L) 39.0 - 52.0 %   MCV 89.8 78.0 - 100.0 fL   MCH 29.8 26.0 - 34.0 pg   MCHC 33.2 30.0 - 36.0 g/dL   RDW 15.5 11.5 - 15.5 %   Platelets 188 150 - 400 K/uL   Neutrophils Relative % 68 %   Neutro Abs 3.7 1.7 - 7.7 K/uL   Lymphocytes Relative 18 %   Lymphs Abs 0.9 0.7 - 4.0 K/uL   Monocytes Relative 6 %   Monocytes Absolute 0.3 0.1 - 1.0 K/uL   Eosinophils Relative 7 %   Eosinophils Absolute 0.4 0.0 - 0.7 K/uL   Basophils Relative 1 %   Basophils Absolute 0.0 0.0 - 0.1 K/uL  Lipase, blood     Status: Abnormal   Collection Time: 01/03/15  5:07 PM  Result Value Ref Range   Lipase 1757 (H) 11 - 51 U/L    Comment: RESULTS CONFIRMED BY MANUAL DILUTION  Urinalysis, Routine w reflex microscopic (not at [facility])     Status: Abnormal   Collection Time: 01/03/15  5:08 PM  Result Value Ref Range   Color, Urine YELLOW YELLOW   APPearance CLEAR CLEAR   Specific Gravity, Urine 1.015 1.005 - 1.030   pH 7.5 5.0 - 8.0   Glucose, UA NEGATIVE NEGATIVE mg/dL   Hgb urine dipstick TRACE (A) NEGATIVE   Bilirubin Urine NEGATIVE NEGATIVE   Ketones, ur NEGATIVE NEGATIVE mg/dL   Protein, ur 100 (A) NEGATIVE mg/dL   Nitrite NEGATIVE NEGATIVE   Leukocytes, UA NEGATIVE NEGATIVE  Urine microscopic-add on     Status: Abnormal   Collection Time: 01/03/15  5:08 PM  Result Value Ref Range   Squamous Epithelial / LPF 0-5 (A) NONE SEEN   WBC, UA NONE SEEN 0 - 5 WBC/hpf   RBC / HPF 0-5 0 - 5 RBC/hpf    Bacteria, UA NONE SEEN NONE SEEN  Glucose, capillary     Status: Abnormal  Collection Time: 01/03/15  8:52 PM  Result Value Ref Range   Glucose-Capillary 63 (L) 65 - 99 mg/dL   Comment 1 Notify RN   Glucose, capillary     Status: Abnormal   Collection Time: 01/04/15  5:27 AM  Result Value Ref Range   Glucose-Capillary 47 (L) 65 - 99 mg/dL   Comment 1 Notify RN   CBC     Status: Abnormal   Collection Time: 01/04/15  6:22 AM  Result Value Ref Range   WBC 5.3 4.0 - 10.5 K/uL   RBC 3.14 (L) 4.22 - 5.81 MIL/uL   Hemoglobin 9.4 (L) 13.0 - 17.0 g/dL   HCT 28.1 (L) 39.0 - 52.0 %   MCV 89.5 78.0 - 100.0 fL   MCH 29.9 26.0 - 34.0 pg   MCHC 33.5 30.0 - 36.0 g/dL   RDW 15.4 11.5 - 15.5 %   Platelets 161 150 - 400 K/uL  Comprehensive metabolic panel     Status: Abnormal   Collection Time: 01/04/15  6:22 AM  Result Value Ref Range   Sodium 138 135 - 145 mmol/L   Potassium 3.6 3.5 - 5.1 mmol/L   Chloride 99 (L) 101 - 111 mmol/L   CO2 27 22 - 32 mmol/L   Glucose, Bld 79 65 - 99 mg/dL   BUN 91 (H) 6 - 20 mg/dL   Creatinine, Ser 5.12 (H) 0.61 - 1.24 mg/dL   Calcium 8.7 (L) 8.9 - 10.3 mg/dL   Total Protein 6.0 (L) 6.5 - 8.1 g/dL   Albumin 3.3 (L) 3.5 - 5.0 g/dL   AST 178 (H) 15 - 41 U/L   ALT 215 (H) 17 - 63 U/L   Alkaline Phosphatase 123 38 - 126 U/L   Total Bilirubin 0.5 0.3 - 1.2 mg/dL   GFR calc non Af Amer 10 (L) >60 mL/min   GFR calc Af Amer 12 (L) >60 mL/min    Comment: (NOTE) The eGFR has been calculated using the CKD EPI equation. This calculation has not been validated in all clinical situations. eGFR's persistently <60 mL/min signify possible Chronic Kidney Disease.    Anion gap 12 5 - 15  Lipase, blood     Status: Abnormal   Collection Time: 01/04/15  6:22 AM  Result Value Ref Range   Lipase 312 (H) 11 - 51 U/L  Glucose, capillary     Status: None   Collection Time: 01/04/15  6:39 AM  Result Value Ref Range   Glucose-Capillary 88 65 - 99 mg/dL  Glucose, capillary      Status: Abnormal   Collection Time: 01/04/15  7:30 AM  Result Value Ref Range   Glucose-Capillary 122 (H) 65 - 99 mg/dL   Comment 1 Notify RN    Comment 2 Document in Chart     Ct Abdomen Pelvis Wo Contrast  01/03/2015  CLINICAL DATA:  Right upper and right lower quadrant pain. Pain radiates to the flank and back. EXAM: CT ABDOMEN AND PELVIS WITHOUT CONTRAST TECHNIQUE: Multidetector CT imaging of the abdomen and pelvis was performed following the standard protocol without IV contrast. COMPARISON:  Renal ultrasound 05/21/2014 FINDINGS: Lower chest: Small focal parenchymal opacity in the posterior lateral right lower lobe. Minimal pleural thickening bilaterally. Punctate granuloma in the right lower lobe. Multi chamber cardiomegaly. Pacemaker wires are partially included. Liver: Scattered hypodense lesions in the liver, majority sub centimeter and too small to accurately characterize, largest in the left lobe measures 3.7 cm. Hepatobiliary: Layering gallstones within  the gallbladder. Gallbladder physiologically distended. No pericholecystic inflammation. No evidence of biliary dilatation, however common bile duct is suboptimally defined. Pancreas: No ductal dilatation or surrounding inflammation. Spleen: No focal lesion. Adrenal glands: No nodule. Kidneys: No hydronephrosis or urolithiasis. Multiple bilateral renal lesions, majority small in size and representing simple cysts. However, there is a hyperdense 9 mm lesion in the medial mid left kidney, with a complex appearing 2 cm hypodense lesion in the left mid upper pole. Stomach/Bowel: Stomach physiologically distended. There are no dilated or thickened small bowel loops. Small volume of stool throughout the colon without colonic wall thickening. The appendix is normal. Vascular/Lymphatic: No retroperitoneal adenopathy. Abdominal aorta is normal in caliber. Moderate atherosclerosis and tortuosity of the abdominal aorta without aneurysm. Reproductive:  Prostate gland normal in size, central prostatic calcification. Bladder: Physiologically distended, no wall thickening. Other: No free air, free fluid, or intra-abdominal fluid collection. Small fat containing umbilical hernia. Musculoskeletal: There are no acute or suspicious osseous abnormalities. Degenerative change in the lumbar spine. IMPRESSION: 1. Small peripheral consolidation in the right lung base, this abuts the pleura and may be the cause of patient's right-sided pain. This likely represent small focal pneumonia. Chest radiograph correlation may be helpful, particularly for follow-up which is recommended in 3-4 weeks. 2. No acute intra-abdominal/pelvic abnormality. 3. Bilateral renal lesions, incompletely characterized without contrast. Majority of these are small and appear to represent cysts. Question of complex cystic lesion in the left kidney, corresponding to that seen on prior ultrasound. Additionally there is a hyperdense subcentimeter lesion in the left kidney. Nonemergent renal protocol MRI with and with contrast is recommended for further characterization. Electronically Signed   By: Jeb Levering M.D.   On: 01/03/2015 19:16    ROS: See chart Blood pressure 161/69, pulse 82, temperature 99.2 F (37.3 C), temperature source Oral, resp. rate 16, height 5' 9"  (1.753 m), weight 89.086 kg (196 lb 6.4 oz), SpO2 100 %. Physical Exam: Pleasant black male in no acute distress. Abdomen is soft, nontender, nondistended. No hepatosplenomegaly, masses, or hernias identified.  Assessment/Plan: Impression: Gallstone pancreatitis, resolving. Plan: Would continue clear liquid diet for today. Does need ultrasound of gallbladder to further delineate the common bile duct. Given his multiple medical problems including chronic renal disease as well as a significant cardiomyopathy with AICD in place, any surgical intervention may need to be done down in [place]. He will need a cholecystectomy at some  point given that this is most likely gallstone pancreatitis. This was explained to the patient and family, who understand.  Trig Mcbryar A 01/04/2015, 10:06 AM

## 2015-01-04 NOTE — Consult Note (Signed)
Referring Provider: Rande Lawman Acosta,MD Primary Care Physician:  Odette Fraction, MD Primary Gastroenterologist:  Dr. Gala Romney.  Reason for Consultation:    Biliary pancreatitis.  HPI:   Patient is 69 year old African-American male with multiple medical problems who had AICD placed on 12/03/2014 for nonischemic cardiomyopathy was feeling fine when he woke up yesterday morning. He had 2 cups of coffee and then had breakfast. So that after he developed severe pain and right upper quadrant radiating posteriorly and into the infrascapular region. He did not experience shortness of breath chest pain diaphoreses nausea vomiting fever or chills. Patient went to urgent care which is few miles away from the lives. He was felt to have gallbladder disease and advised to go to hospital. By that time he left that facility he was pain-free and decided to go home. No sooner he drank Pepsi pain relapse and he decided to come back to emergency room. On evaluation he was noted have elevated transaminases and serum lipase. Unenhanced abdominopelvic CT was obtained revealing calcified gallstones but no evidence of dilated bile duct or pancreatic abnormality. Patient has been seen by Dr. Aviva Signs who has recommended cholecystectomy a tertiary center because of multiple comorbidities. This morning patient is feeling better. He has mild pain below the right costal margin. He is hungry. He denies heartburn dysphagia melena or rectal bleeding. He has had problems with constipation which she believes is due to out of it medications. He has bowel movement every 2-3 days. There is no history of recent weight loss. He is not aware that he has gallstones which are reported on renal ultrasound of April 2013. Patient is retired Administrator. He is married. He has 2 sons and a daughter in good health. He's been smoking cigarettes for 55 years and now down to 4-5 cigarettes per day. He drinks alcohol occasionally. He has  5 sisters and one brother living. Older sister has hypertension and diabetes. Brother also has health problems but he is not aware. Father died of unknown cancer at 24 and mother lived to be 89.    Past Medical History  Diagnosis Date  . Chronic combined systolic and diastolic CHF (congestive heart failure) ([place])     a. 02/2014 Echo: EF 20-25% (new), Gr 2 DD, mod conc LVH, mild AI/AS, mod MR, sev dil LA, mild TR.  Marland Kitchen Nonischemic cardiomyopathy ([place])     a. 02/2014 Echo: EF 20-25%;  b. 03/2014 Cath: LM nl, LAD min irregs, LCX   . Hypertension   . Chronic kidney disease (CKD), stage IV (severe) ([place]) 12/2009    a. baseline creat now ~ 4.  . Erectile dysfunction   . Colonic polyp   . Low serum testosterone level   . Tobacco abuse   . Benign prostatic hypertrophy   . Obesity, Class I, BMI 30-34.9   . Hyperlipidemia   . Adenomatous colon polyp 2006  . H. pylori infection 2013    treated with prevpac  . Mild Ao Stenosis and Insufficiency   . AICD (automatic cardioverter/defibrillator) present     STJ device, implanted 12/03/14 Dr. Lovena Le  . Sleep apnea 2010    Initially unable to afford CPAP  . 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 Gout     Past Surgical History  Procedure Laterality Date  . Retinal laser procedure      diabetic retinopathy  . Lymph node biopsy      surgical exploration of neck-not entirely  clear that this represented a lymph node biopsy  . Colonoscopy  08/23/2004    Dr. Vivi Ferns rectum, diminutive polyp of the rectosigmoid removed, inflamed focally adenomatous polyp  . Left and right heart catheterization with coronary angiogram N/A 03/19/2014    Procedure: LEFT AND RIGHT HEART CATHETERIZATION WITH CORONARY ANGIOGRAM;  Surgeon: Blane Ohara, MD;  Location: [facility] CATH LAB;  Service: Cardiovascular;  Laterality: N/A;  . Insertion of icd  12/03/2014  . Cardiac catheterization    . Ep implantable device N/A 12/03/2014    Procedure: ICD  Implant;  Surgeon: Evans Lance, MD;  Location: [place] [facility] CV LAB;  Service: Cardiovascular;  Laterality: N/A;    Prior to Admission medications   Medication Sig Start Date End Date Taking? Authorizing Provider  aspirin EC 81 MG tablet Take 81 mg by mouth daily.   Yes Historical Provider, MD  cloNIDine (CATAPRES) 0.3 MG tablet Take 0.3 mg by mouth daily. 12/02/14  Yes Historical Provider, MD  COLCRYS 0.6 MG tablet Take 2 tabs at first sign of gout and repeat in 1 hour if pain persist s 12/02/14  Yes Susy Frizzle, MD  hydrALAZINE (APRESOLINE) 100 MG tablet Take 0.5 tablets (50 mg total) by mouth 2 (two) times daily. 01/02/15  Yes Susy Frizzle, MD  Insulin Glargine (LANTUS SOLOSTAR) 100 UNIT/ML Solostar Pen Inject 30 Units into the skin daily at 10 pm. Patient taking differently: Inject 30 Units into the skin daily.  03/28/14  Yes Susy Frizzle, MD  metoprolol succinate (TOPROL-XL) 50 MG 24 hr tablet Take 25 mg by mouth daily. 11/06/14  Yes Historical Provider, MD  potassium chloride SA (K-DUR,KLOR-CON) 20 MEQ tablet Take 1.5 tablets (30 mEq total) by mouth 3 (three) times daily. 01/02/15  Yes Susy Frizzle, MD  torsemide (DEMADEX) 100 MG tablet Take 1 tablet (100 mg total) by mouth 2 (two) times daily. 01/02/15  Yes Susy Frizzle, MD  metolazone (ZAROXOLYN) 2.5 MG tablet Take 1 tablet by mouth three times a week as needed for swelling Patient taking differently: Take 2.5 mg by mouth as needed (Swelling).  11/20/14   Susy Frizzle, MD  ONE TOUCH ULTRA TEST test strip Check blood sugar each morning fasting and once daily 2 hours after a meal 12/10/14   Susy Frizzle, MD    Current Facility-Administered Medications  Medication Dose Route Frequency Provider Last Rate Last Dose  . 0.9 %  sodium chloride infusion   Intravenous Continuous Oswald Hillock, MD 50 mL/hr at 01/03/15 2100    . aspirin EC tablet 81 mg  81 mg Oral Daily Oswald Hillock, MD   81 mg at 01/04/15 [id number]  .  cloNIDine (CATAPRES) tablet 0.3 mg  0.3 mg Oral Daily Oswald Hillock, MD   0.3 mg at 01/04/15 0904  . heparin injection 5,000 Units  5,000 Units Subcutaneous 3 times per day Oswald Hillock, MD   5,000 Units at 01/04/15 0600  . hydrALAZINE (APRESOLINE) tablet 50 mg  50 mg Oral BID Oswald Hillock, MD   50 mg at 01/04/15 0917  . insulin aspart (novoLOG) injection 0-9 Units  0-9 Units Subcutaneous TID WC Oswald Hillock, MD   1 Units at 01/04/15 [phone]  . insulin glargine (LANTUS) injection 30 Units  30 Units Subcutaneous Daily Estela Leonie Green, MD      . metoprolol succinate (TOPROL-XL) 24 hr tablet 25 mg  25 mg Oral Daily Oswald Hillock, MD  25 mg at 01/04/15 0904  . morphine 2 MG/ML injection 2 mg  2 mg Intravenous Q4H PRN Oswald Hillock, MD      . ondansetron [facility]) tablet 4 mg  4 mg Oral Q6H PRN Oswald Hillock, MD       Or  . ondansetron (ZOFRAN) injection 4 mg  4 mg Intravenous Q6H PRN Oswald Hillock, MD      . zolpidem (AMBIEN) tablet 5 mg  5 mg Oral QHS PRN Dianne Dun, NP        Allergies as of 01/03/2015 - Review Complete 01/03/2015  Allergen Reaction Noted  . Ace inhibitors Cough     Family History  Problem Relation Age of Onset  . Hypertension Mother   . Cancer Mother   . Cancer Father   . Diabetes Sister     Social History   Social History  . Marital Status: Married    Spouse Name: N/A  . Number of Children: 4  . Years of Education: N/A   Occupational History  . Semi - Retired Administrator    Social History Main Topics  . Smoking status: Current Every Day Smoker -- 0.50 packs/day for 56 years    Types: Cigarettes    Start date: 05/31/1958  . Smokeless tobacco: Never Used  . Alcohol Use: No  . Drug Use: No  . Sexual Activity: Not Currently   Other Topics Concern  . Not on file   Social History Narrative    Review of Systems: See HPI, otherwise normal ROS  Physical Exam: Temp:  [97.9 F (36.6 C)-99.2 F (37.3 C)] 99.2 F (37.3 C) (12/18  0641) Pulse Rate:  [72-89] 82 (12/18 0904) Resp:  [12-18] 16 (12/18 0641) BP: (155-178)/(55-69) 161/69 mmHg (12/18 0917) SpO2:  [89 %-100 %] 100 % (12/18 0641) Weight:  [196 lb 6.4 oz (89.086 kg)-202 lb (91.627 kg)] 196 lb 6.4 oz (89.086 kg) (12/17 2135) Last BM Date: 01/03/15 Patient is alert and in no acute distress. Conjunctiva is pink. Sclerae nonicteric. Oropharyngeal mucosa is normal. No neck masses or thyromegaly noted. Cardiac exam with regular rhythm normal S1 and S2. He has grade 3/6 holosystolic murmur best heard at left sternal border as well as diastolic murmur. Lungs are clear to auscultation. Abdomen is symmetrical. Bowel sounds are normal. On palpation is soft with mild tenderness below the right costal margin. No organomegaly or masses. Peripheral edema or clubbing noted.  Lab Results:  Recent Labs  01/03/15 1707 01/04/15 0622  WBC 5.3 5.3  HGB 10.2* 9.4*  HCT 30.7* 28.1*  PLT 188 161   BMET  Recent Labs  01/01/15 1052 01/03/15 1707 01/04/15 0622  NA 139 140 138  K 3.4* 3.6 3.6  CL 100 97* 99*  CO2 26 28 27   GLUCOSE 137* 85 79  BUN 93* 96* 91*  CREATININE 5.14* 5.05* 5.12*  CALCIUM 8.5* 9.2 8.7*   LFT  Recent Labs  01/04/15 0622  PROT 6.0*  ALBUMIN 3.3*  AST 178*  ALT 215*  ALKPHOS 123  BILITOT 0.5   PT/INR No results for input(s): LABPROT, INR in the last 72 hours. Hepatitis Panel No results for input(s): HEPBSAG, HCVAB, HEPAIGM, HEPBIGM in the last 72 hours.  Studies/Results: Ct Abdomen Pelvis Wo Contrast  01/03/2015  CLINICAL DATA:  Right upper and right lower quadrant pain. Pain radiates to the flank and back. EXAM: CT ABDOMEN AND PELVIS WITHOUT CONTRAST TECHNIQUE: Multidetector CT imaging of the abdomen and pelvis was  performed following the standard protocol without IV contrast. COMPARISON:  Renal ultrasound 05/21/2014 FINDINGS: Lower chest: Small focal parenchymal opacity in the posterior lateral right lower lobe. Minimal pleural  thickening bilaterally. Punctate granuloma in the right lower lobe. Multi chamber cardiomegaly. Pacemaker wires are partially included. Liver: Scattered hypodense lesions in the liver, majority sub centimeter and too small to accurately characterize, largest in the left lobe measures 3.7 cm. Hepatobiliary: Layering gallstones within the gallbladder. Gallbladder physiologically distended. No pericholecystic inflammation. No evidence of biliary dilatation, however common bile duct is suboptimally defined. Pancreas: No ductal dilatation or surrounding inflammation. Spleen: No focal lesion. Adrenal glands: No nodule. Kidneys: No hydronephrosis or urolithiasis. Multiple bilateral renal lesions, majority small in size and representing simple cysts. However, there is a hyperdense 9 mm lesion in the medial mid left kidney, with a complex appearing 2 cm hypodense lesion in the left mid upper pole. Stomach/Bowel: Stomach physiologically distended. There are no dilated or thickened small bowel loops. Small volume of stool throughout the colon without colonic wall thickening. The appendix is normal. Vascular/Lymphatic: No retroperitoneal adenopathy. Abdominal aorta is normal in caliber. Moderate atherosclerosis and tortuosity of the abdominal aorta without aneurysm. Reproductive: Prostate gland normal in size, central prostatic calcification. Bladder: Physiologically distended, no wall thickening. Other: No free air, free fluid, or intra-abdominal fluid collection. Small fat containing umbilical hernia. Musculoskeletal: There are no acute or suspicious osseous abnormalities. Degenerative change in the lumbar spine. IMPRESSION: 1. Small peripheral consolidation in the right lung base, this abuts the pleura and may be the cause of patient's right-sided pain. This likely represent small focal pneumonia. Chest radiograph correlation may be helpful, particularly for follow-up which is recommended in 3-4 weeks. 2. No acute  intra-abdominal/pelvic abnormality. 3. Bilateral renal lesions, incompletely characterized without contrast. Majority of these are small and appear to represent cysts. Question of complex cystic lesion in the left kidney, corresponding to that seen on prior ultrasound. Additionally there is a hyperdense subcentimeter lesion in the left kidney. Nonemergent renal protocol MRI with and with contrast is recommended for further characterization. Electronically Signed   By: Jeb Levering M.D.   On: 01/03/2015 19:16   CT scan reviewed.  Assessment; #1. Acute biliary pancreatitis. Transaminases and serum lipase are coming down. Bilirubin is normal. He does not appear to be acutely ill. He therefore would appear to have mild to biliary pancreatitis. Bile duct not well seen on CT but does not appear to be dilated. It can be further evaluated with ultrasound. There is no need for ERCP. I also feel he does not need to be on IV antibiotics. Patient will need laparoscopic cholecystectomy and he is deemed to be high risk because of multiple comorbidities including nonischemic cardiomyopathy and kidney disease. #2. Liver lesions most likely hepatic cysts. These can be further evaluated with abdominal ultrasound. #3. Anemia appears to be of chronic disease. No history of melena or rectal bleeding last colonoscopy was in August 2013.  Recommendations; Change diet to clear liquids. Upper abdominal ultrasound in a.m. Repeat lab in a.m. Laparoscopic cholecystectomy per Dr. Arnoldo Morale..   LOS: 1 day   REHMAN,NAJEEB U  01/04/2015, 10:45 AM

## 2015-01-05 ENCOUNTER — Encounter (HOSPITAL_COMMUNITY): Payer: Self-pay | Admitting: Gastroenterology

## 2015-01-05 ENCOUNTER — Observation Stay (HOSPITAL_COMMUNITY): Payer: Medicare Other

## 2015-01-05 DIAGNOSIS — K851 Biliary acute pancreatitis without necrosis or infection: Secondary | ICD-10-CM | POA: Insufficient documentation

## 2015-01-05 DIAGNOSIS — K8020 Calculus of gallbladder without cholecystitis without obstruction: Secondary | ICD-10-CM | POA: Diagnosis not present

## 2015-01-05 LAB — GLUCOSE, CAPILLARY
GLUCOSE-CAPILLARY: 85 mg/dL (ref 65–99)
GLUCOSE-CAPILLARY: 129 mg/dL — AB (ref 65–99)
GLUCOSE-CAPILLARY: 135 mg/dL — AB (ref 65–99)
GLUCOSE-CAPILLARY: 102 mg/dL — AB (ref 65–99)
GLUCOSE-CAPILLARY: 125 mg/dL — AB (ref 65–99)

## 2015-01-05 LAB — COMPREHENSIVE METABOLIC PANEL
ALT: 134 U/L — ABNORMAL HIGH (ref 17–63)
ANION GAP: 12 (ref 5–15)
AST: 43 U/L — ABNORMAL HIGH (ref 15–41)
Albumin: 3.7 g/dL (ref 3.5–5.0)
Alkaline Phosphatase: 115 U/L (ref 38–126)
BILIRUBIN TOTAL: 0.8 mg/dL (ref 0.3–1.2)
BUN: 92 mg/dL — AB (ref 6–20)
CHLORIDE: 101 mmol/L (ref 101–111)
CO2: 25 mmol/L (ref 22–32)
Calcium: 9.1 mg/dL (ref 8.9–10.3)
Creatinine, Ser: 5.18 mg/dL — ABNORMAL HIGH (ref 0.61–1.24)
GFR, EST AFRICAN AMERICAN: 12 mL/min — AB (ref >60)
GFR, EST NON AFRICAN AMERICAN: 10 mL/min — AB (ref >60)
Glucose, Bld: 117 mg/dL — ABNORMAL HIGH (ref 65–99)
POTASSIUM: 3.5 mmol/L (ref 3.5–5.1)
Sodium: 138 mmol/L (ref 135–145)
TOTAL PROTEIN: 6.2 g/dL — AB (ref 6.5–8.1)

## 2015-01-05 LAB — CBC
HEMATOCRIT: 27.8 % — AB (ref 39.0–52.0)
HEMOGLOBIN: 9.6 g/dL — AB (ref 13.0–17.0)
MCH: 30.7 pg (ref 26.0–34.0)
MCHC: 34.5 g/dL (ref 30.0–36.0)
MCV: 88.8 fL (ref 78.0–100.0)
Platelets: 175 10*3/uL (ref 150–400)
RBC: 3.13 MIL/uL — AB (ref 4.22–5.81)
RDW: 15.2 % (ref 11.5–15.5)
WBC: 5.6 10*3/uL (ref 4.0–10.5)

## 2015-01-05 LAB — HEMOGLOBIN A1C
Hgb A1c MFr Bld: 5.9 % — ABNORMAL HIGH (ref 4.8–5.6)
Mean Plasma Glucose: 123 mg/dL

## 2015-01-05 LAB — APTT: APTT: 32 s (ref 24–37)

## 2015-01-05 LAB — LIPASE, BLOOD: Lipase: 50 U/L (ref 11–51)

## 2015-01-05 MED ORDER — TORSEMIDE 20 MG PO TABS
100.0000 mg | ORAL_TABLET | Freq: Two times a day (BID) | ORAL | Status: DC
  Administered 2015-01-05 – 2015-01-10 (×9): 100 mg via ORAL
  Filled 2015-01-05 (×9): qty 5

## 2015-01-05 MED ORDER — ALBUTEROL SULFATE (2.5 MG/3ML) 0.083% IN NEBU
2.5000 mg | INHALATION_SOLUTION | RESPIRATORY_TRACT | Status: DC | PRN
Start: 2015-01-05 — End: 2015-01-11
  Administered 2015-01-05: 2.5 mg via RESPIRATORY_TRACT
  Filled 2015-01-05: qty 3

## 2015-01-05 NOTE — Progress Notes (Signed)
Report called to [place] on 3E at [facility].  Answered questions at this time.  Awaiting [facility] to transport pt.  Will continue to monitor until pt leaves.

## 2015-01-05 NOTE — Progress Notes (Signed)
Transfer report received from [facility] at 1329 and pt arrived to the unit with [facility] via ambulation at 1400. Pt Oriented to the unit and room; telemetry applied and verified with second person per protocol; pt skin dry and intact with not pressure ulcer or wound noted. Pt sitting up in chair with call light within reach. VSS; will closely monitor. Delia Heady RN

## 2015-01-05 NOTE — Consult Note (Signed)
Reason for Consult:  Gallstone Pancreatitis Referring Physician: Dr. Nena Alexander  [name] is an 69 y.o. male.  HPI:  69y/o admitted to [facility] on 01/03/15 with abdominal pain that started after eating breakfast including a sausage and biscuit.  Pain eased off  But returned after drinking a Pepsi.  He went to a Urgent Care and was referred for further evaluation and Ultrasound in the ED at [facility].   Work up in the ED showed he was afebrile, BP up some.  Creatinine was 5.12, lipase ws 312.  AST 178, ALT 215.  WBC was 5.3.  He has some anemia also.  CT scan was obtained and this shows Layering gallstones within the gallbladder.  Gallbladder physiologically distended. No pericholecystic inflammation. No evidence of biliary dilatation, however common bile duct is suboptimally defined. Pancreas: No ductal dilatation or surrounding inflammation.  Small peripheral consolidation in the right lung base, this abuts the pleura and may be the cause of patient's right-sided pain. This likely represent small focal pneumonia.  Ultrasound today 01/05/15, shows Cholelithiasis without evidence cholecystitis.   Limited visualization of the pancreatic head is normal though the pancreas is overall suboptimally evaluated due to overlying bowel gas. . Suspected approximately 0.8 cm echogenic adherent stone versus polyp within otherwise normal-appearing gallbladder.  His LFT's and lipase are improving.   Because of his multiple medical issues he is transferred to [facility] and we are ask to see.     Past Medical History  Diagnosis Date  . Chronic combined systolic and diastolic CHF (congestive heart failure) ([place])     a. 02/2014 Echo: EF 20-25% (new), Gr 2 DD, mod conc LVH, mild AI/AS, mod MR, sev dil LA, mild TR.  Marland Kitchen Nonischemic cardiomyopathy ([place])     a. 02/2014 Echo: EF 20-25%;  b. 03/2014 Cath: LM nl, LAD min irregs, LCX   . Hypertension   . Chronic kidney disease (CKD), stage IV (severe) ([place]) 12/2009    a. baseline creat now ~  4.  . Erectile dysfunction   . Colonic polyp   . Low serum testosterone level   . Tobacco abuse   . Benign prostatic hypertrophy   . Obesity, Class I, BMI 30-34.9   . Hyperlipidemia   . Adenomatous colon polyp 2006  . H. pylori infection 2013    treated with prevpac  . Mild Ao Stenosis and Insufficiency   . AICD (automatic cardioverter/defibrillator) present     STJ device, implanted 12/03/14 Dr. Lovena Le  . Sleep apnea 2010    Initially unable to afford CPAP  . 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 Gout     Past Surgical History  Procedure Laterality Date  . Retinal laser procedure      diabetic retinopathy  . Lymph node biopsy      surgical exploration of neck-not entirely clear that this represented a lymph node biopsy  . Colonoscopy  08/23/2004    Dr. Vivi Ferns rectum, diminutive polyp of the rectosigmoid removed, inflamed focally adenomatous polyp  . Left and right heart catheterization with coronary angiogram N/A 03/19/2014    Procedure: LEFT AND RIGHT HEART CATHETERIZATION WITH CORONARY ANGIOGRAM;  Surgeon: Blane Ohara, MD;  Location: [facility] CATH LAB;  Service: Cardiovascular;  Laterality: N/A;  . Insertion of icd  12/03/2014  . Cardiac catheterization    . Ep implantable device N/A 12/03/2014    Procedure: ICD Implant;  Surgeon: Evans Lance, MD;  Location: [facility]  INVASIVE CV LAB;  Service: Cardiovascular;  Laterality: N/A;  . Esophagogastroduodenoscopy  2013    Dr. Gala Romney: erosions reminiscent of GAVE but biopsies showed H.pylori gastritis  . Colonoscopy  2013    Dr. Gala Romney: multiple diminutive polyps in distal sigmoid segment, 4 mm pedunculated polyp at hepatic flexure. Tubular adenomas. Surveillance due 2018     Family History  Problem Relation Age of Onset  . Hypertension Mother   . Cancer Mother   . Cancer Father   . Diabetes Sister     Social History:  reports that he has been smoking Cigarettes.  He started smoking about 56 years  ago. He has a 28 pack-year smoking history. He has never used smokeless tobacco. He reports that he does not drink alcohol or use illicit drugs.  Allergies:  Allergies  Allergen Reactions  . Ace Inhibitors Cough    Medications:  Prior to Admission:  Prescriptions prior to admission  Medication Sig Dispense Refill Last Dose  . aspirin EC 81 MG tablet Take 81 mg by mouth daily.   01/03/2015  . cloNIDine (CATAPRES) 0.3 MG tablet Take 0.3 mg by mouth daily.   01/03/2015  . COLCRYS 0.6 MG tablet Take 2 tabs at first sign of gout and repeat in 1 hour if pain persist s 30 tablet 3 Past Month  . hydrALAZINE (APRESOLINE) 100 MG tablet Take 0.5 tablets (50 mg total) by mouth 2 (two) times daily. 90 tablet 3 01/03/2015  . Insulin Glargine (LANTUS SOLOSTAR) 100 UNIT/ML Solostar Pen Inject 30 Units into the skin daily at 10 pm. (Patient taking differently: Inject 30 Units into the skin daily. ) 5 pen 3 01/03/2015  . metoprolol succinate (TOPROL-XL) 50 MG 24 hr tablet Take 25 mg by mouth daily.   01/03/2015 at 0730  . potassium chloride SA (K-DUR,KLOR-CON) 20 MEQ tablet Take 1.5 tablets (30 mEq total) by mouth 3 (three) times daily. 90 tablet 3 01/03/2015  . torsemide (DEMADEX) 100 MG tablet Take 1 tablet (100 mg total) by mouth 2 (two) times daily. 180 tablet 3 01/03/2015  . metolazone (ZAROXOLYN) 2.5 MG tablet Take 1 tablet by mouth three times a week as needed for swelling (Patient taking differently: Take 2.5 mg by mouth as needed (Swelling). ) 10 tablet 1 12/30/2014  . ONE TOUCH ULTRA TEST test strip Check blood sugar each morning fasting and once daily 2 hours after a meal 100 each 5 Taking   Scheduled: . aspirin EC  81 mg Oral Daily  . cloNIDine  0.3 mg Oral Daily  . heparin subcutaneous  5,000 Units Subcutaneous 3 times per day  . hydrALAZINE  50 mg Oral BID  . insulin aspart  0-9 Units Subcutaneous TID WC  . insulin glargine  30 Units Subcutaneous Daily  . metoprolol succinate  25 mg Oral  Daily  . torsemide  100 mg Oral BID   Continuous:  XTA:[id number], morphine injection, ondansetron **OR** ondansetron (ZOFRAN) IV, zolpidem Anti-infectives    None      Results for orders placed or performed during the hospital encounter of 01/03/15 (from the past 48 hour(s))  Comprehensive metabolic panel     Status: Abnormal   Collection Time: 01/03/15  5:07 PM  Result Value Ref Range   Sodium 140 135 - 145 mmol/L   Potassium 3.6 3.5 - 5.1 mmol/L   Chloride 97 (L) 101 - 111 mmol/L   CO2 28 22 - 32 mmol/L   Glucose, Bld 85 65 - 99 mg/dL  BUN 96 (H) 6 - 20 mg/dL   Creatinine, Ser 5.05 (H) 0.61 - 1.24 mg/dL   Calcium 9.2 8.9 - 10.3 mg/dL   Total Protein 6.5 6.5 - 8.1 g/dL   Albumin 3.8 3.5 - 5.0 g/dL   AST 554 (H) 15 - 41 U/L   ALT 274 (H) 17 - 63 U/L   Alkaline Phosphatase 132 (H) 38 - 126 U/L   Total Bilirubin 1.5 (H) 0.3 - 1.2 mg/dL   GFR calc non Af Amer 10 (L) >60 mL/min   GFR calc Af Amer 12 (L) >60 mL/min    Comment: (NOTE) The eGFR has been calculated using the CKD EPI equation. This calculation has not been validated in all clinical situations. eGFR's persistently <60 mL/min signify possible Chronic Kidney Disease.    Anion gap 15 5 - 15  CBC with Differential/Platelet     Status: Abnormal   Collection Time: 01/03/15  5:07 PM  Result Value Ref Range   WBC 5.3 4.0 - 10.5 K/uL   RBC 3.42 (L) 4.22 - 5.81 MIL/uL   Hemoglobin 10.2 (L) 13.0 - 17.0 g/dL   HCT 30.7 (L) 39.0 - 52.0 %   MCV 89.8 78.0 - 100.0 fL   MCH 29.8 26.0 - 34.0 pg   MCHC 33.2 30.0 - 36.0 g/dL   RDW 15.5 11.5 - 15.5 %   Platelets 188 150 - 400 K/uL   Neutrophils Relative % 68 %   Neutro Abs 3.7 1.7 - 7.7 K/uL   Lymphocytes Relative 18 %   Lymphs Abs 0.9 0.7 - 4.0 K/uL   Monocytes Relative 6 %   Monocytes Absolute 0.3 0.1 - 1.0 K/uL   Eosinophils Relative 7 %   Eosinophils Absolute 0.4 0.0 - 0.7 K/uL   Basophils Relative 1 %   Basophils Absolute 0.0 0.0 - 0.1 K/uL  Lipase, blood      Status: Abnormal   Collection Time: 01/03/15  5:07 PM  Result Value Ref Range   Lipase 1757 (H) 11 - 51 U/L    Comment: RESULTS CONFIRMED BY MANUAL DILUTION  Hemoglobin A1c     Status: Abnormal   Collection Time: 01/03/15  5:07 PM  Result Value Ref Range   Hgb A1c MFr Bld 5.9 (H) 4.8 - 5.6 %    Comment: (NOTE)         Pre-diabetes: 5.7 - 6.4         Diabetes: >6.4         Glycemic control for adults with diabetes: <7.0    Mean Plasma Glucose 123 mg/dL    Comment: (NOTE) Performed At: [facility] [place], [place] [id number] Lindon Romp MD [id number]   Urinalysis, Routine w reflex microscopic (not at [facility])     Status: Abnormal   Collection Time: 01/03/15  5:08 PM  Result Value Ref Range   Color, Urine YELLOW YELLOW   APPearance CLEAR CLEAR   Specific Gravity, Urine 1.015 1.005 - 1.030   pH 7.5 5.0 - 8.0   Glucose, UA NEGATIVE NEGATIVE mg/dL   Hgb urine dipstick TRACE (A) NEGATIVE   Bilirubin Urine NEGATIVE NEGATIVE   Ketones, ur NEGATIVE NEGATIVE mg/dL   Protein, ur 100 (A) NEGATIVE mg/dL   Nitrite NEGATIVE NEGATIVE   Leukocytes, UA NEGATIVE NEGATIVE  Urine microscopic-add on     Status: Abnormal   Collection Time: 01/03/15  5:08 PM  Result Value Ref Range   Squamous Epithelial / LPF 0-5 (A) NONE SEEN  WBC, UA NONE SEEN 0 - 5 WBC/hpf   RBC / HPF 0-5 0 - 5 RBC/hpf   Bacteria, UA NONE SEEN NONE SEEN  Glucose, capillary     Status: Abnormal   Collection Time: 01/03/15  8:52 PM  Result Value Ref Range   Glucose-Capillary 63 (L) 65 - 99 mg/dL   Comment 1 Notify RN   Glucose, capillary     Status: Abnormal   Collection Time: 01/04/15  5:27 AM  Result Value Ref Range   Glucose-Capillary 47 (L) 65 - 99 mg/dL   Comment 1 Notify RN   CBC     Status: Abnormal   Collection Time: 01/04/15  6:22 AM  Result Value Ref Range   WBC 5.3 4.0 - 10.5 K/uL   RBC 3.14 (L) 4.22 - 5.81 MIL/uL   Hemoglobin 9.4 (L) 13.0 - 17.0 g/dL   HCT 28.1 (L) 39.0 -  52.0 %   MCV 89.5 78.0 - 100.0 fL   MCH 29.9 26.0 - 34.0 pg   MCHC 33.5 30.0 - 36.0 g/dL   RDW 15.4 11.5 - 15.5 %   Platelets 161 150 - 400 K/uL  Comprehensive metabolic panel     Status: Abnormal   Collection Time: 01/04/15  6:22 AM  Result Value Ref Range   Sodium 138 135 - 145 mmol/L   Potassium 3.6 3.5 - 5.1 mmol/L   Chloride 99 (L) 101 - 111 mmol/L   CO2 27 22 - 32 mmol/L   Glucose, Bld 79 65 - 99 mg/dL   BUN 91 (H) 6 - 20 mg/dL   Creatinine, Ser 5.12 (H) 0.61 - 1.24 mg/dL   Calcium 8.7 (L) 8.9 - 10.3 mg/dL   Total Protein 6.0 (L) 6.5 - 8.1 g/dL   Albumin 3.3 (L) 3.5 - 5.0 g/dL   AST 178 (H) 15 - 41 U/L   ALT 215 (H) 17 - 63 U/L   Alkaline Phosphatase 123 38 - 126 U/L   Total Bilirubin 0.5 0.3 - 1.2 mg/dL   GFR calc non Af Amer 10 (L) >60 mL/min   GFR calc Af Amer 12 (L) >60 mL/min    Comment: (NOTE) The eGFR has been calculated using the CKD EPI equation. This calculation has not been validated in all clinical situations. eGFR's persistently <60 mL/min signify possible Chronic Kidney Disease.    Anion gap 12 5 - 15  Lipase, blood     Status: Abnormal   Collection Time: 01/04/15  6:22 AM  Result Value Ref Range   Lipase 312 (H) 11 - 51 U/L  Glucose, capillary     Status: None   Collection Time: 01/04/15  6:39 AM  Result Value Ref Range   Glucose-Capillary 88 65 - 99 mg/dL  Glucose, capillary     Status: Abnormal   Collection Time: 01/04/15  7:30 AM  Result Value Ref Range   Glucose-Capillary 122 (H) 65 - 99 mg/dL   Comment 1 Notify RN    Comment 2 Document in Chart   Glucose, capillary     Status: Abnormal   Collection Time: 01/04/15 11:06 AM  Result Value Ref Range   Glucose-Capillary 106 (H) 65 - 99 mg/dL   Comment 1 Notify RN    Comment 2 Document in Chart   Glucose, capillary     Status: Abnormal   Collection Time: 01/04/15  4:20 PM  Result Value Ref Range   Glucose-Capillary 110 (H) 65 - 99 mg/dL   Comment 1 Notify RN  Comment 2 Document in Chart    Glucose, capillary     Status: None   Collection Time: 01/04/15  9:54 PM  Result Value Ref Range   Glucose-Capillary 99 65 - 99 mg/dL  Glucose, capillary     Status: None   Collection Time: 01/05/15  7:30 AM  Result Value Ref Range   Glucose-Capillary 85 65 - 99 mg/dL   Comment 1 Notify RN   Comprehensive metabolic panel     Status: Abnormal   Collection Time: 01/05/15  9:36 AM  Result Value Ref Range   Sodium 138 135 - 145 mmol/L   Potassium 3.5 3.5 - 5.1 mmol/L   Chloride 101 101 - 111 mmol/L   CO2 25 22 - 32 mmol/L   Glucose, Bld 117 (H) 65 - 99 mg/dL   BUN 92 (H) 6 - 20 mg/dL   Creatinine, Ser 5.18 (H) 0.61 - 1.24 mg/dL   Calcium 9.1 8.9 - 10.3 mg/dL   Total Protein 6.2 (L) 6.5 - 8.1 g/dL   Albumin 3.7 3.5 - 5.0 g/dL   AST 43 (H) 15 - 41 U/L   ALT 134 (H) 17 - 63 U/L   Alkaline Phosphatase 115 38 - 126 U/L   Total Bilirubin 0.8 0.3 - 1.2 mg/dL   GFR calc non Af Amer 10 (L) >60 mL/min   GFR calc Af Amer 12 (L) >60 mL/min    Comment: (NOTE) The eGFR has been calculated using the CKD EPI equation. This calculation has not been validated in all clinical situations. eGFR's persistently <60 mL/min signify possible Chronic Kidney Disease.    Anion gap 12 5 - 15  CBC     Status: Abnormal   Collection Time: 01/05/15  9:36 AM  Result Value Ref Range   WBC 5.6 4.0 - 10.5 K/uL   RBC 3.13 (L) 4.22 - 5.81 MIL/uL   Hemoglobin 9.6 (L) 13.0 - 17.0 g/dL   HCT 27.8 (L) 39.0 - 52.0 %   MCV 88.8 78.0 - 100.0 fL   MCH 30.7 26.0 - 34.0 pg   MCHC 34.5 30.0 - 36.0 g/dL   RDW 15.2 11.5 - 15.5 %   Platelets 175 150 - 400 K/uL  Lipase, blood     Status: None   Collection Time: 01/05/15  9:36 AM  Result Value Ref Range   Lipase 50 11 - 51 U/L  Glucose, capillary     Status: Abnormal   Collection Time: 01/05/15 11:52 AM  Result Value Ref Range   Glucose-Capillary 129 (H) 65 - 99 mg/dL    Ct Abdomen Pelvis Wo Contrast  01/03/2015  CLINICAL DATA:  Right upper and right lower quadrant  pain. Pain radiates to the flank and back. EXAM: CT ABDOMEN AND PELVIS WITHOUT CONTRAST TECHNIQUE: Multidetector CT imaging of the abdomen and pelvis was performed following the standard protocol without IV contrast. COMPARISON:  Renal ultrasound 05/21/2014 FINDINGS: Lower chest: Small focal parenchymal opacity in the posterior lateral right lower lobe. Minimal pleural thickening bilaterally. Punctate granuloma in the right lower lobe. Multi chamber cardiomegaly. Pacemaker wires are partially included. Liver: Scattered hypodense lesions in the liver, majority sub centimeter and too small to accurately characterize, largest in the left lobe measures 3.7 cm. Hepatobiliary: Layering gallstones within the gallbladder. Gallbladder physiologically distended. No pericholecystic inflammation. No evidence of biliary dilatation, however common bile duct is suboptimally defined. Pancreas: No ductal dilatation or surrounding inflammation. Spleen: No focal lesion. Adrenal glands: No nodule. Kidneys: No hydronephrosis or urolithiasis.  Multiple bilateral renal lesions, majority small in size and representing simple cysts. However, there is a hyperdense 9 mm lesion in the medial mid left kidney, with a complex appearing 2 cm hypodense lesion in the left mid upper pole. Stomach/Bowel: Stomach physiologically distended. There are no dilated or thickened small bowel loops. Small volume of stool throughout the colon without colonic wall thickening. The appendix is normal. Vascular/Lymphatic: No retroperitoneal adenopathy. Abdominal aorta is normal in caliber. Moderate atherosclerosis and tortuosity of the abdominal aorta without aneurysm. Reproductive: Prostate gland normal in size, central prostatic calcification. Bladder: Physiologically distended, no wall thickening. Other: No free air, free fluid, or intra-abdominal fluid collection. Small fat containing umbilical hernia. Musculoskeletal: There are no acute or suspicious osseous  abnormalities. Degenerative change in the lumbar spine. IMPRESSION: 1. Small peripheral consolidation in the right lung base, this abuts the pleura and may be the cause of patient's right-sided pain. This likely represent small focal pneumonia. Chest radiograph correlation may be helpful, particularly for follow-up which is recommended in 3-4 weeks. 2. No acute intra-abdominal/pelvic abnormality. 3. Bilateral renal lesions, incompletely characterized without contrast. Majority of these are small and appear to represent cysts. Question of complex cystic lesion in the left kidney, corresponding to that seen on prior ultrasound. Additionally there is a hyperdense subcentimeter lesion in the left kidney. Nonemergent renal protocol MRI with and with contrast is recommended for further characterization. Electronically Signed   By: Jeb Levering M.D.   On: 01/03/2015 19:16   US Abdomen Complete  01/05/2015  CLINICAL DATA:  Elevated LFTs.  Pancreatitis. EXAM: ABDOMEN ULTRASOUND COMPLETE COMPARISON:  Renal ultrasound - 05/21/2014 ; CT abdomen pelvis - 01/03/2015 FINDINGS: Gallbladder: There are several echogenic shadowing stones seen within otherwise normal-appearing gallbladder (representative image 12). Additionally, there is a approximately 0.8 x 0.6 cm echogenic structure within the nondependent portion of the gallbladder (images 17 and 21) which may represent either an adherent stone versus a gallbladder polyp. No gallbladder wall thickening or pericholecystic fluid. Negative sonographic Murphy's sign. Common bile duct: Diameter: Normal in size measuring 5 mm in diameter Liver: There is an approximately 3.5 x 3.8 x 2.7 cm anechoic cyst within the subcapsular aspect of the left lobe of the liver. There is an additional punctate (approximately 0.7 cm) hypo attenuating lesion in the subcapsular aspect the right lobe of the liver which is too small to accurately characterize though favored to represent additional  hepatic cysts. Otherwise, normal appearance of the liver. No additional hepatic lesions identified. No intrahepatic biliary duct dilatation. No ascites. IVC: No abnormality visualized. Pancreas: Limited visualization of the pancreatic head and neck is normal. Visualization of the pancreatic body and tail is obscured by bowel gas. Spleen: Obscured by bowel gas Right Kidney: Normal in size measuring 11.5 cm in diameter. There is preserved renal cortical thickness, however there is diffuse increased renal cortical echogenicity (representative image 95), similar to renal ultrasound performed 05/2014. Grossly unchanged approximately 1.8 x 1.6 x 1.8 cm partially exophytic cyst arising from the superior pole the right kidney, unchanged since the 05/2014 examination. No evidence of right-sided urinary obstruction. No definite echogenic renal stones. Left Kidney: Normal in size measuring 11.8 cm in length. There is preserved left renal cortical thickness though there is unchanged mild diffuse increased echogenicity of the left renal parenchyma. Unchanged mild lobularity of the left renal contour. Grossly unchanged approximately 2.5 x 2.1 x 2.9 cm partially exophytic cyst arising from the superior pole the left kidney, similar to the 05/2014 examination.  No echogenic renal stones. No evidence of left-sided urinary obstruction. Abdominal aorta: No aneurysm visualized. Other findings: None. IMPRESSION: 1. Cholelithiasis without evidence cholecystitis. 2. Limited visualization of the pancreatic head is normal though the pancreas is overall suboptimally evaluated due to overlying bowel gas. Clinical history of pancreatitis in the setting of gallstones does raise the possibility of gallstone pancreatitis. Clinical correlation is advised. Further evaluation with ERCP or MRCP could be performed as indicated. 3. Suspected approximately 0.8 cm echogenic adherent stone versus polyp within otherwise normal-appearing gallbladder. If  cholecystectomy is not pursued, a follow-up gallbladder ultrasound in 6-12 months is recommended to ensure stability and/or resolution. 4. Grossly unchanged bilateral renal cysts. 5. Grossly unchanged increased echogenicity of the bilateral renal parenchyma suggest medical renal disease. No evidence of urinary obstruction. Electronically Signed   By: Sandi Mariscal M.D.   On: 01/05/2015 11:41    Review of Systems  Constitutional: Negative.   HENT: Negative.   Eyes: Negative.   Respiratory: Positive for cough and wheezing.        Wheezing and cough after fluids for pancreatitis.  Cardiovascular: Positive for leg swelling (with fluids in hospital). Negative for PND.  Gastrointestinal: Positive for abdominal pain and constipation. Negative for heartburn, nausea, vomiting, diarrhea, blood in stool and melena.  Genitourinary: Negative.   Musculoskeletal: Positive for joint pain (left knee gives him trouble.).  Skin: Negative.   Neurological: Negative.   Endo/Heme/Allergies: Negative.   Psychiatric/Behavioral: Negative.    Blood pressure 154/56, pulse 78, temperature 97.6 F (36.4 C), temperature source Oral, resp. rate 18, height _0  (1.753 m), weight 88.7 kg (195 lb 8.8 oz), SpO2 100 %. Physical Exam  Constitutional: He is oriented to person, place, and time. He appears well-developed and well-nourished. No distress.  HENT:  Head: Normocephalic and atraumatic.  Nose: Nose normal.  Eyes: Conjunctivae and EOM are normal. Right eye exhibits no discharge. Left eye exhibits no discharge. No scleral icterus.  Neck: Normal range of motion. Neck supple. No JVD present. No tracheal deviation present. No thyromegaly present.  Cardiovascular: Normal rate and regular rhythm.   Murmur (soft II/VI systolic mumur) heard. Respiratory: Effort normal and breath sounds normal. No respiratory distress. He has no wheezes. He has no rales. He exhibits no tenderness.  GI: Soft. Bowel sounds are normal. He exhibits  no distension and no mass. There is no tenderness. There is no rebound and no guarding.  Currently he has no pain.  Musculoskeletal: Edema: 1 edema both lower legs.  Lymphadenopathy:    He has no cervical adenopathy.  Neurological: He is alert and oriented to person, place, and time. No cranial nerve deficit.  Skin: Skin is warm and dry. No rash noted. He is not diaphoretic. No erythema. No pallor.  Psychiatric: He has a normal mood and affect. His behavior is normal. Judgment and thought content normal.    Assessment/Plan: Gallstone pancreatitis improving Chronic systolic and diastolic CHF Aortic stenosis/insufficiency  Cardiomyopathy EF 20-25% S/p AICD placement Chronic kidney disease Tobacco use Hypertension Sleep apnea AODM OSA Body mass index is 28.8  Gout BPH   Plan:  We will plan cholecystectomy after clearance from Medicine and Cardiology.  Recheck labs in AM. He is currently pain free.    Moriya Mitchell 01/05/2015, 2:48 PM

## 2015-01-05 NOTE — Progress Notes (Signed)
U/S and labs pending.  Patient complains of some shortness of breath due to fluids. Clinically, he denies any abdominal pain or nausea. Ideally, I would recommend cholecystectomy during this admission as it appears his gallstone pancreatitis is resolving and he has multiple comorbidities. I will defer this judgment to surgery at [facility]. Given his multiple comorbidities, he'll be transferred to [place] for further management and care. This was explained to the family, who understand and agreed to the treatment plan.

## 2015-01-05 NOTE — Progress Notes (Signed)
[facility] PROGRESS NOTE  [name] [id number] DOB: 1945/02/20 DOA: 01/03/2015 PCP: Jenna Luo TOM, MD  Assessment/Plan: Gallstone pancreatitis -We will advance diet to clear liquids. -He states abdominal pain is improved. His LFTs have decreased significantly. -Seen by GI and surgery. -No plan for ERCP at present. -Abdominal ultrasound ordered and is pending. -Seen in consultation by surgery for cholecystectomy, however per surgery, multiple medical comorbidities have high level of complexity and is recommending transfer to [facility]. Have discussed case with Dr. Georgette Dover with surgery who will see patient in consultation.  Pneumonia -CT abdomen shows infiltrate of the right lung base, however patient isn't having any signs and symptoms of pneumonia at this time and as such will not initiate antibiotics.  Diabetes mellitus -Well-controlled.  Hypertension -We'll controlled, continue current medications  Chronic kidney disease stage V -Not on dialysis. -Continue outpatient follow-up with nephrology, may require inpatient nephrology consultation if creatinine continues to worsen.    Nonischemic cardiomyopathy  -most recent echo showed an ejection fraction of 20-25% and grade 2 diastolic dysfunction. -No signs of volume overload at present. -Patient is status post AICD placement. -We'll resume home dose of torsemide 100 mg twice a day.  Code Status: Full code  Family Communication: Patient only  Disposition Plan: To be determined  Consultants:  Dr. Laural Golden, GI   Dr. Arnoldo Morale, surgery    Antibiotics:  None   Subjective: Abdominal pain improved   Objective: Filed Vitals:   01/05/15 0641 01/05/15 1125 01/05/15 1226 01/05/15 1409  BP: 155/63 188/72 159/53 154/56  Pulse: 83 96 84 78  Temp: 98.3 F (36.8 C)  98.7 F (37.1 C) 97.6 F (36.4 C)  TempSrc: Oral  Oral Oral  Resp: 16  16 18   Height:    5\' 9"  (1.753 m)  Weight:    88.7 kg (195 lb 8.8  oz)  SpO2: 100%  99% 100%   No intake or output data in the 24 hours ending 01/05/15 1425 Filed Weights   01/03/15 1627 01/03/15 2135 01/05/15 1409  Weight: 91.627 kg (202 lb) 89.086 kg (196 lb 6.4 oz) 88.7 kg (195 lb 8.8 oz)    Exam:   General:  Alert, awake, oriented 3, no current distress   Cardiovascular: Regular rate and rhythm   Respiratory: Clear to auscultation bilaterally   Abdomen: Soft, slightly tender to palpation to the right upper quadrant   Extremities: No clubbing, cyanosis or edema  Neurologic:  Grossly intact and nonfocal   Data Reviewed: Basic Metabolic Panel:  Recent Labs Lab 01/01/15 1052 01/03/15 1707 01/04/15 0622 01/05/15 0936  NA 139 140 138 138  K 3.4* 3.6 3.6 3.5  CL 100 97* 99* 101  CO2 26 28 27 25   GLUCOSE 137* 85 79 117*  BUN 93* 96* 91* 92*  CREATININE 5.14* 5.05* 5.12* 5.18*  CALCIUM 8.5* 9.2 8.7* 9.1   Liver Function Tests:  Recent Labs Lab 01/03/15 1707 01/04/15 0622 01/05/15 0936  AST 554* 178* 43*  ALT 274* 215* 134*  ALKPHOS 132* 123 115  BILITOT 1.5* 0.5 0.8  PROT 6.5 6.0* 6.2*  ALBUMIN 3.8 3.3* 3.7    Recent Labs Lab 01/03/15 1707 01/04/15 0622 01/05/15 0936  LIPASE 1757* 312* 50   No results for input(s): AMMONIA in the last 168 hours. CBC:  Recent Labs Lab 01/03/15 1707 01/04/15 0622 01/05/15 0936  WBC 5.3 5.3 5.6  NEUTROABS 3.7  --   --   HGB 10.2* 9.4* 9.6*  HCT 30.7*  28.1* 27.8*  MCV 89.8 89.5 88.8  PLT 188 161 175   Cardiac Enzymes: No results for input(s): CKTOTAL, CKMB, CKMBINDEX, TROPONINI in the last 168 hours. BNP (last 3 results)  Recent Labs  01/22/14 2302  BNP >4500.0*    ProBNP (last 3 results) No results for input(s): PROBNP in the last 8760 hours.  CBG:  Recent Labs Lab 01/04/15 1106 01/04/15 1620 01/04/15 2154 01/05/15 0730 01/05/15 1152  GLUCAP 106* 110* 99 85 129*    No results found for this or any previous visit (from the past 240 hour(s)).    Studies: Ct Abdomen Pelvis Wo Contrast  01/03/2015  CLINICAL DATA:  Right upper and right lower quadrant pain. Pain radiates to the flank and back. EXAM: CT ABDOMEN AND PELVIS WITHOUT CONTRAST TECHNIQUE: Multidetector CT imaging of the abdomen and pelvis was performed following the standard protocol without IV contrast. COMPARISON:  Renal ultrasound 05/21/2014 FINDINGS: Lower chest: Small focal parenchymal opacity in the posterior lateral right lower lobe. Minimal pleural thickening bilaterally. Punctate granuloma in the right lower lobe. Multi chamber cardiomegaly. Pacemaker wires are partially included. Liver: Scattered hypodense lesions in the liver, majority sub centimeter and too small to accurately characterize, largest in the left lobe measures 3.7 cm. Hepatobiliary: Layering gallstones within the gallbladder. Gallbladder physiologically distended. No pericholecystic inflammation. No evidence of biliary dilatation, however common bile duct is suboptimally defined. Pancreas: No ductal dilatation or surrounding inflammation. Spleen: No focal lesion. Adrenal glands: No nodule. Kidneys: No hydronephrosis or urolithiasis. Multiple bilateral renal lesions, majority small in size and representing simple cysts. However, there is a hyperdense 9 mm lesion in the medial mid left kidney, with a complex appearing 2 cm hypodense lesion in the left mid upper pole. Stomach/Bowel: Stomach physiologically distended. There are no dilated or thickened small bowel loops. Small volume of stool throughout the colon without colonic wall thickening. The appendix is normal. Vascular/Lymphatic: No retroperitoneal adenopathy. Abdominal aorta is normal in caliber. Moderate atherosclerosis and tortuosity of the abdominal aorta without aneurysm. Reproductive: Prostate gland normal in size, central prostatic calcification. Bladder: Physiologically distended, no wall thickening. Other: No free air, free fluid, or intra-abdominal fluid  collection. Small fat containing umbilical hernia. Musculoskeletal: There are no acute or suspicious osseous abnormalities. Degenerative change in the lumbar spine. IMPRESSION: 1. Small peripheral consolidation in the right lung base, this abuts the pleura and may be the cause of patient's right-sided pain. This likely represent small focal pneumonia. Chest radiograph correlation may be helpful, particularly for follow-up which is recommended in 3-4 weeks. 2. No acute intra-abdominal/pelvic abnormality. 3. Bilateral renal lesions, incompletely characterized without contrast. Majority of these are small and appear to represent cysts. Question of complex cystic lesion in the left kidney, corresponding to that seen on prior ultrasound. Additionally there is a hyperdense subcentimeter lesion in the left kidney. Nonemergent renal protocol MRI with and with contrast is recommended for further characterization. Electronically Signed   By: Jeb Levering M.D.   On: 01/03/2015 19:16   US Abdomen Complete  01/05/2015  CLINICAL DATA:  Elevated LFTs.  Pancreatitis. EXAM: ABDOMEN ULTRASOUND COMPLETE COMPARISON:  Renal ultrasound - 05/21/2014 ; CT abdomen pelvis - 01/03/2015 FINDINGS: Gallbladder: There are several echogenic shadowing stones seen within otherwise normal-appearing gallbladder (representative image 12). Additionally, there is a approximately 0.8 x 0.6 cm echogenic structure within the nondependent portion of the gallbladder (images 17 and 21) which may represent either an adherent stone versus a gallbladder polyp. No gallbladder wall thickening or pericholecystic  fluid. Negative sonographic Murphy's sign. Common bile duct: Diameter: Normal in size measuring 5 mm in diameter Liver: There is an approximately 3.5 x 3.8 x 2.7 cm anechoic cyst within the subcapsular aspect of the left lobe of the liver. There is an additional punctate (approximately 0.7 cm) hypo attenuating lesion in the subcapsular aspect the  right lobe of the liver which is too small to accurately characterize though favored to represent additional hepatic cysts. Otherwise, normal appearance of the liver. No additional hepatic lesions identified. No intrahepatic biliary duct dilatation. No ascites. IVC: No abnormality visualized. Pancreas: Limited visualization of the pancreatic head and neck is normal. Visualization of the pancreatic body and tail is obscured by bowel gas. Spleen: Obscured by bowel gas Right Kidney: Normal in size measuring 11.5 cm in diameter. There is preserved renal cortical thickness, however there is diffuse increased renal cortical echogenicity (representative image 95), similar to renal ultrasound performed 05/2014. Grossly unchanged approximately 1.8 x 1.6 x 1.8 cm partially exophytic cyst arising from the superior pole the right kidney, unchanged since the 05/2014 examination. No evidence of right-sided urinary obstruction. No definite echogenic renal stones. Left Kidney: Normal in size measuring 11.8 cm in length. There is preserved left renal cortical thickness though there is unchanged mild diffuse increased echogenicity of the left renal parenchyma. Unchanged mild lobularity of the left renal contour. Grossly unchanged approximately 2.5 x 2.1 x 2.9 cm partially exophytic cyst arising from the superior pole the left kidney, similar to the 05/2014 examination. No echogenic renal stones. No evidence of left-sided urinary obstruction. Abdominal aorta: No aneurysm visualized. Other findings: None. IMPRESSION: 1. Cholelithiasis without evidence cholecystitis. 2. Limited visualization of the pancreatic head is normal though the pancreas is overall suboptimally evaluated due to overlying bowel gas. Clinical history of pancreatitis in the setting of gallstones does raise the possibility of gallstone pancreatitis. Clinical correlation is advised. Further evaluation with ERCP or MRCP could be performed as indicated. 3. Suspected  approximately 0.8 cm echogenic adherent stone versus polyp within otherwise normal-appearing gallbladder. If cholecystectomy is not pursued, a follow-up gallbladder ultrasound in 6-12 months is recommended to ensure stability and/or resolution. 4. Grossly unchanged bilateral renal cysts. 5. Grossly unchanged increased echogenicity of the bilateral renal parenchyma suggest medical renal disease. No evidence of urinary obstruction. Electronically Signed   By: Sandi Mariscal M.D.   On: 01/05/2015 11:41    Scheduled Meds: . aspirin EC  81 mg Oral Daily  . cloNIDine  0.3 mg Oral Daily  . heparin subcutaneous  5,000 Units Subcutaneous 3 times per day  . hydrALAZINE  50 mg Oral BID  . insulin aspart  0-9 Units Subcutaneous TID WC  . insulin glargine  30 Units Subcutaneous Daily  . metoprolol succinate  25 mg Oral Daily   Continuous Infusions:    Active Problems:   Diabetes mellitus type 2 with retinopathy ([place])   Hypokalemia   HTN (hypertension)   Nonischemic cardiomyopathy ([facility])   Ventricular tachycardia ([facility])   Gallstone pancreatitis   Pancreatitis   Acute biliary pancreatitis    Time spent: 25 minutes.  Greater than 50% of this time was spent in direct contact with the patient coordinating care.    Lelon Frohlich  [facility] Pager [phone]  If 7PM-7AM, please contact night-coverage at [email or web address], password [facility] 01/05/2015, 2:25 PM  LOS: 2 days

## 2015-01-05 NOTE — Progress Notes (Signed)
Subjective: No abdominal pain, no N/V. Wants to eat. Ultrasound abdomen is ordered for today.   Objective: Vital signs in last 24 hours: Temp:  [98 F (36.7 C)-98.7 F (37.1 C)] 98.3 F (36.8 C) (12/19 0641) Pulse Rate:  [80-96] 83 (12/19 0641) Resp:  [16-18] 16 (12/19 0641) BP: (155-178)/(55-69) 155/63 mmHg (12/19 0641) SpO2:  [94 %-100 %] 100 % (12/19 0641) Last BM Date: 01/03/15 General:   Alert and oriented, pleasant Head:  Normocephalic and atraumatic. Abdomen:  Bowel sounds present, soft, non-tender, non-distended. Extremities:  Without  edema. Neurologic:  Alert and  oriented x4 Psych:  Alert and cooperative. Normal mood and affect.  Intake/Output from previous day: 12/18 0701 - 12/19 0700 In: 600 [P.O.:600] Out: -  Intake/Output this shift:    Lab Results:  Recent Labs  01/03/15 1707 01/04/15 0622  WBC 5.3 5.3  HGB 10.2* 9.4*  HCT 30.7* 28.1*  PLT 188 161   BMET  Recent Labs  01/03/15 1707 01/04/15 0622  NA 140 138  K 3.6 3.6  CL 97* 99*  CO2 28 27  GLUCOSE 85 79  BUN 96* 91*  CREATININE 5.05* 5.12*  CALCIUM 9.2 8.7*   LFT  Recent Labs  01/03/15 1707 01/04/15 0622  PROT 6.5 6.0*  ALBUMIN 3.8 3.3*  AST 554* 178*  ALT 274* 215*  ALKPHOS 132* 123  BILITOT 1.5* 0.5     Studies/Results: Ct Abdomen Pelvis Wo Contrast  01/03/2015  CLINICAL DATA:  Right upper and right lower quadrant pain. Pain radiates to the flank and back. EXAM: CT ABDOMEN AND PELVIS WITHOUT CONTRAST TECHNIQUE: Multidetector CT imaging of the abdomen and pelvis was performed following the standard protocol without IV contrast. COMPARISON:  Renal ultrasound 05/21/2014 FINDINGS: Lower chest: Small focal parenchymal opacity in the posterior lateral right lower lobe. Minimal pleural thickening bilaterally. Punctate granuloma in the right lower lobe. Multi chamber cardiomegaly. Pacemaker wires are partially included. Liver: Scattered hypodense lesions in the liver, majority  sub centimeter and too small to accurately characterize, largest in the left lobe measures 3.7 cm. Hepatobiliary: Layering gallstones within the gallbladder. Gallbladder physiologically distended. No pericholecystic inflammation. No evidence of biliary dilatation, however common bile duct is suboptimally defined. Pancreas: No ductal dilatation or surrounding inflammation. Spleen: No focal lesion. Adrenal glands: No nodule. Kidneys: No hydronephrosis or urolithiasis. Multiple bilateral renal lesions, majority small in size and representing simple cysts. However, there is a hyperdense 9 mm lesion in the medial mid left kidney, with a complex appearing 2 cm hypodense lesion in the left mid upper pole. Stomach/Bowel: Stomach physiologically distended. There are no dilated or thickened small bowel loops. Small volume of stool throughout the colon without colonic wall thickening. The appendix is normal. Vascular/Lymphatic: No retroperitoneal adenopathy. Abdominal aorta is normal in caliber. Moderate atherosclerosis and tortuosity of the abdominal aorta without aneurysm. Reproductive: Prostate gland normal in size, central prostatic calcification. Bladder: Physiologically distended, no wall thickening. Other: No free air, free fluid, or intra-abdominal fluid collection. Small fat containing umbilical hernia. Musculoskeletal: There are no acute or suspicious osseous abnormalities. Degenerative change in the lumbar spine. IMPRESSION: 1. Small peripheral consolidation in the right lung base, this abuts the pleura and may be the cause of patient's right-sided pain. This likely represent small focal pneumonia. Chest radiograph correlation may be helpful, particularly for follow-up which is recommended in 3-4 weeks. 2. No acute intra-abdominal/pelvic abnormality. 3. Bilateral renal lesions, incompletely characterized without contrast. Majority of these are small and appear to  represent cysts. Question of complex cystic lesion in  the left kidney, corresponding to that seen on prior ultrasound. Additionally there is a hyperdense subcentimeter lesion in the left kidney. Nonemergent renal protocol MRI with and with contrast is recommended for further characterization. Electronically Signed   By: Jeb Levering M.D.   On: 01/03/2015 19:16    Assessment: 69 year old male admitted with acute biliary pancreatitis, elevated transaminases, and CT imaging without contrast: no obvious biliary dilation. Hypodense lesions noted in liver too small to characterize. No need for ERCP, and cholecystectomy recommended. Surgical consultation noted, but patient is high risk due to significant comorbidities of cardiomyopathy, renal disease and transfer to [facility] likely for cholecystectomy. He has significantly improved clinically since admission and appropriate for diet advancement after ultrasound.   Liver lesions: follow-up on pending ultrasound abdomen. May need additional imaging as outpatient.  Anemia: likely of chronic disease. Without overt GI bleeding. Colonoscopy/EGD on file from 2013. Due for surveillance colonoscopy in 2018.   Plan: Recheck CMP, lipase US abdomen ordered NPO for now; advance as tolerated to low fat diet Likely will need cholecystectomy at tertiary facility due to comorbidities: Dr. Arnoldo Morale following   Orvil Feil, ANP-BC [facility]    LOS: 2 days    01/05/2015, 7:53 AM

## 2015-01-06 ENCOUNTER — Encounter (HOSPITAL_COMMUNITY): Payer: Self-pay | Admitting: Cardiology

## 2015-01-06 DIAGNOSIS — N185 Chronic kidney disease, stage 5: Secondary | ICD-10-CM | POA: Diagnosis not present

## 2015-01-06 DIAGNOSIS — I429 Cardiomyopathy, unspecified: Secondary | ICD-10-CM | POA: Diagnosis not present

## 2015-01-06 DIAGNOSIS — Z01810 Encounter for preprocedural cardiovascular examination: Secondary | ICD-10-CM

## 2015-01-06 DIAGNOSIS — E1121 Type 2 diabetes mellitus with diabetic nephropathy: Secondary | ICD-10-CM | POA: Diagnosis not present

## 2015-01-06 DIAGNOSIS — K851 Biliary acute pancreatitis without necrosis or infection: Secondary | ICD-10-CM | POA: Diagnosis not present

## 2015-01-06 DIAGNOSIS — K8050 Calculus of bile duct without cholangitis or cholecystitis without obstruction: Secondary | ICD-10-CM | POA: Diagnosis not present

## 2015-01-06 DIAGNOSIS — I10 Essential (primary) hypertension: Secondary | ICD-10-CM

## 2015-01-06 DIAGNOSIS — E876 Hypokalemia: Secondary | ICD-10-CM | POA: Diagnosis not present

## 2015-01-06 DIAGNOSIS — K8010 Calculus of gallbladder with chronic cholecystitis without obstruction: Secondary | ICD-10-CM | POA: Diagnosis not present

## 2015-01-06 LAB — PROTIME-INR
INR: 1.1 (ref 0.00–1.49)
PROTHROMBIN TIME: 14.4 s (ref 11.6–15.2)

## 2015-01-06 LAB — COMPREHENSIVE METABOLIC PANEL
ALT: 99 U/L — AB (ref 17–63)
ANION GAP: 15 (ref 5–15)
AST: 26 U/L (ref 15–41)
Albumin: 3.4 g/dL — ABNORMAL LOW (ref 3.5–5.0)
Alkaline Phosphatase: 108 U/L (ref 38–126)
BUN: 88 mg/dL — ABNORMAL HIGH (ref 6–20)
CHLORIDE: 100 mmol/L — AB (ref 101–111)
CO2: 25 mmol/L (ref 22–32)
Calcium: 9.5 mg/dL (ref 8.9–10.3)
Creatinine, Ser: 5.4 mg/dL — ABNORMAL HIGH (ref 0.61–1.24)
GFR, EST AFRICAN AMERICAN: 11 mL/min — AB (ref >60)
GFR, EST NON AFRICAN AMERICAN: 10 mL/min — AB (ref >60)
Glucose, Bld: 69 mg/dL (ref 65–99)
POTASSIUM: 3.3 mmol/L — AB (ref 3.5–5.1)
SODIUM: 140 mmol/L (ref 135–145)
Total Bilirubin: 0.8 mg/dL (ref 0.3–1.2)
Total Protein: 5.8 g/dL — ABNORMAL LOW (ref 6.5–8.1)

## 2015-01-06 LAB — CBC
HCT: 28.3 % — ABNORMAL LOW (ref 39.0–52.0)
HEMOGLOBIN: 9.4 g/dL — AB (ref 13.0–17.0)
MCH: 29.7 pg (ref 26.0–34.0)
MCHC: 33.2 g/dL (ref 30.0–36.0)
MCV: 89.3 fL (ref 78.0–100.0)
Platelets: 184 10*3/uL (ref 150–400)
RBC: 3.17 MIL/uL — AB (ref 4.22–5.81)
RDW: 15.4 % (ref 11.5–15.5)
WBC: 5.8 10*3/uL (ref 4.0–10.5)

## 2015-01-06 LAB — BRAIN NATRIURETIC PEPTIDE: B Natriuretic Peptide: >4500 pg/mL — ABNORMAL HIGH (ref 0.0–100.0)

## 2015-01-06 LAB — GLUCOSE, CAPILLARY
GLUCOSE-CAPILLARY: 124 mg/dL — AB (ref 65–99)
GLUCOSE-CAPILLARY: 133 mg/dL — AB (ref 65–99)
Glucose-Capillary: 70 mg/dL (ref 65–99)
Glucose-Capillary: 110 mg/dL — ABNORMAL HIGH (ref 65–99)
Glucose-Capillary: 104 mg/dL — ABNORMAL HIGH (ref 65–99)

## 2015-01-06 LAB — LIPASE, BLOOD: Lipase: 39 U/L (ref 11–51)

## 2015-01-06 MED ORDER — POTASSIUM CHLORIDE CRYS ER 20 MEQ PO TBCR: 30.0000 meq | EXTENDED_RELEASE_TABLET | Freq: Every day | ORAL | Status: DC

## 2015-01-06 MED ORDER — HEPARIN SODIUM (PORCINE) 5000 UNIT/ML IJ SOLN
5000.0000 [IU] | Freq: Three times a day (TID) | INTRAMUSCULAR | Status: AC
  Administered 2015-01-06: 5000 [IU] via SUBCUTANEOUS
  Filled 2015-01-06: qty 1

## 2015-01-06 MED ORDER — DEXTROSE 50 % IV SOLN
INTRAVENOUS | Status: AC
  Administered 2015-01-06: 25 mL
  Filled 2015-01-06: qty 50

## 2015-01-06 MED ORDER — INSULIN GLARGINE 100 UNIT/ML ~~LOC~~ SOLN
10.0000 [IU] | Freq: Every day | SUBCUTANEOUS | Status: DC
  Administered 2015-01-08 – 2015-01-10 (×3): 10 [IU] via SUBCUTANEOUS
  Filled 2015-01-06 (×5): qty 0.1

## 2015-01-06 NOTE — Progress Notes (Signed)
Subjective: He feels fine and hungry. No pain.   Objective: Vital signs in last 24 hours: Temp:  [97.6 F (36.4 C)-98.7 F (37.1 C)] 98.1 F (36.7 C) (12/20 0817) Pulse Rate:  [78-96] 80 (12/20 0817) Resp:  [16-18] 18 (12/20 0817) BP: (143-188)/(47-72) 160/56 mmHg (12/20 0817) SpO2:  [96 %-100 %] 96 % (12/20 0817) Weight:  [87.544 kg (193 lb)-88.7 kg (195 lb 8.8 oz)] 87.544 kg (193 lb) (12/20 0610) Last BM Date: 01/05/15 PO 240 last PM,  Afebrile, VSS Labs OK except for BNP  Intake/Output from previous day: 12/19 0701 - 12/20 0700 In: 240 [P.O.:240] Out: 325 [Urine:325] Intake/Output this shift:    General appearance: alert, cooperative and no distress GI: soft, non-tender; bowel sounds normal; no masses,  no organomegaly  Lab Results:   Recent Labs  01/05/15 0936 01/06/15 0604  WBC 5.6 5.8  HGB 9.6* 9.4*  HCT 27.8* 28.3*  PLT 175 184    BMET  Recent Labs  01/05/15 0936 01/06/15 0604  NA 138 140  K 3.5 3.3*  CL 101 100*  CO2 25 25  GLUCOSE 117* 69  BUN 92* 88*  CREATININE 5.18* 5.40*  CALCIUM 9.1 9.5   PT/INR  Recent Labs  01/06/15 0604  LABPROT 14.4  INR 1.10     Recent Labs Lab 01/03/15 1707 01/04/15 0622 01/05/15 0936 01/06/15 0604  AST 554* 178* 43* 26  ALT 274* 215* 134* 99*  ALKPHOS 132* 123 115 108  BILITOT 1.5* 0.5 0.8 0.8  PROT 6.5 6.0* 6.2* 5.8*  ALBUMIN 3.8 3.3* 3.7 3.4*     Lipase     Component Value Date/Time   LIPASE 39 01/06/2015 0604     Studies/Results: US Abdomen Complete  01/05/2015  CLINICAL DATA:  Elevated LFTs.  Pancreatitis. EXAM: ABDOMEN ULTRASOUND COMPLETE COMPARISON:  Renal ultrasound - 05/21/2014 ; CT abdomen pelvis - 01/03/2015 FINDINGS: Gallbladder: There are several echogenic shadowing stones seen within otherwise normal-appearing gallbladder (representative image 12). Additionally, there is a approximately 0.8 x 0.6 cm echogenic structure within the nondependent portion of the gallbladder  (images 17 and 21) which may represent either an adherent stone versus a gallbladder polyp. No gallbladder wall thickening or pericholecystic fluid. Negative sonographic Murphy's sign. Common bile duct: Diameter: Normal in size measuring 5 mm in diameter Liver: There is an approximately 3.5 x 3.8 x 2.7 cm anechoic cyst within the subcapsular aspect of the left lobe of the liver. There is an additional punctate (approximately 0.7 cm) hypo attenuating lesion in the subcapsular aspect the right lobe of the liver which is too small to accurately characterize though favored to represent additional hepatic cysts. Otherwise, normal appearance of the liver. No additional hepatic lesions identified. No intrahepatic biliary duct dilatation. No ascites. IVC: No abnormality visualized. Pancreas: Limited visualization of the pancreatic head and neck is normal. Visualization of the pancreatic body and tail is obscured by bowel gas. Spleen: Obscured by bowel gas Right Kidney: Normal in size measuring 11.5 cm in diameter. There is preserved renal cortical thickness, however there is diffuse increased renal cortical echogenicity (representative image 95), similar to renal ultrasound performed 05/2014. Grossly unchanged approximately 1.8 x 1.6 x 1.8 cm partially exophytic cyst arising from the superior pole the right kidney, unchanged since the 05/2014 examination. No evidence of right-sided urinary obstruction. No definite echogenic renal stones. Left Kidney: Normal in size measuring 11.8 cm in length. There is preserved left renal cortical thickness though there is unchanged mild diffuse increased echogenicity of  the left renal parenchyma. Unchanged mild lobularity of the left renal contour. Grossly unchanged approximately 2.5 x 2.1 x 2.9 cm partially exophytic cyst arising from the superior pole the left kidney, similar to the 05/2014 examination. No echogenic renal stones. No evidence of left-sided urinary obstruction. Abdominal  aorta: No aneurysm visualized. Other findings: None. IMPRESSION: 1. Cholelithiasis without evidence cholecystitis. 2. Limited visualization of the pancreatic head is normal though the pancreas is overall suboptimally evaluated due to overlying bowel gas. Clinical history of pancreatitis in the setting of gallstones does raise the possibility of gallstone pancreatitis. Clinical correlation is advised. Further evaluation with ERCP or MRCP could be performed as indicated. 3. Suspected approximately 0.8 cm echogenic adherent stone versus polyp within otherwise normal-appearing gallbladder. If cholecystectomy is not pursued, a follow-up gallbladder ultrasound in 6-12 months is recommended to ensure stability and/or resolution. 4. Grossly unchanged bilateral renal cysts. 5. Grossly unchanged increased echogenicity of the bilateral renal parenchyma suggest medical renal disease. No evidence of urinary obstruction. Electronically Signed   By: Sandi Mariscal M.D.   On: 01/05/2015 11:41    Medications: . aspirin EC  81 mg Oral Daily  . cloNIDine  0.3 mg Oral Daily  . heparin subcutaneous  5,000 Units Subcutaneous 3 times per day  . hydrALAZINE  50 mg Oral BID  . insulin aspart  0-9 Units Subcutaneous TID WC  . insulin glargine  30 Units Subcutaneous Daily  . metoprolol succinate  25 mg Oral Daily  . torsemide  100 mg Oral BID    Assessment/Plan Gallstone pancreatitis improving Chronic systolic and diastolic CHF Aortic stenosis/insufficiency  Cardiomyopathy EF 20-25% S/p AICD placement Chronic kidney disease Tobacco use Hypertension Sleep apnea AODM OSA Body mass index is 28.8  Gout BPH   Plan:  BNP> 4500, but he looks great.  Once Cardiology clears we will proceed with cholecystectomy.  I will make him NPO for tomorrow.       LOS: 3 days    Earnestine Tuohey 01/06/2015

## 2015-01-06 NOTE — Consult Note (Signed)
Reason for Consult: pre-op cardiology risk stratification    Referring Physician: Dr. Carles Collet   PCP:  Odette Fraction, MD  Primary Cardiologist: Dr. Jacinta Shoe EP: Dr. Riki [name] is an 69 y.o. male.    Chief Complaint: admitted 01/03/15 at [facility] with abd pain and Dx gallstone pancreatitis    HPI:   Asked to see 69 year old male for cardiac risk stratification for chole.  He has a hx of a nonischemic cardiomyopathy with severe left ventricular systolic dysfunction and chronic systolic heart failure, along with stage IV chronic kidney disease and essential hypertension. Echo (03/07/14) EF 20-25% - also showed grade II diastolic dysfunction, mild aortic regurgitation and mild aortic stenosis with moderate mitral regurgitation and severe left atrial dilatation. Right ventricular systolic function was mildly reduced with mild right atrial dilatation, a PFO, and mild tricuspid regurgitation.  He has a left bundle branch block.  He had ICD implant [facility]  11/2014 for MADIT II/ SCD-HeFT criteria for ICD implantation for primary prevention of sudden death.   Rt and Lt cardiac cath 03/19/14 with widely patent coronary arteries with minimal irregularity noted.   Rt cardiac cath: Procedural Findings: Hemodynamics RA mean 7 RV 53/8 PA 54/29 mean 41 PCWP a wave 23, v wave 28, mean 22 LV 182/32 AO 180/60 mean 108  Oxygen saturations: PA 75 AO 99  Cardiac Output (Fick) 8.5  Cardiac Index (Fick) 4.1  Pt now admitted 01/03/15 with gallstone pancreatitis.  Originally admitted to [facility] after visit to urgent care with abdominal pain.  His Cr was 5.12 and LFTs elevated.  CT scan with layering gallstones in the gallbladder and distended gallbladder.  Also with hyperdense 47m lesion in the medial mid left kidney and 2 cm hypodense lesion in Lt mid upper pole.  Surgery has evaluated and would like to proceed with laparoscopic cholecystectomy later this week.  On CT possible PNA  with RLL infiltrate but no fever.    BNP > 4500 today, last CXR 12/04/14 with clear lungs no acute cardiopulmonary abnormality seen.   Cr remains 5.40 -he continues on his torsemide 100 mg BID po and toprol 25 mg daily, and hydralazine 50 BID  Also on clonidine 0.3 mg daily. Zaroxolyn on hold.  EKG SR LBBB.  Today denies and recent chest pain or SOB.  No edema, weight has been stable.  Only issue has been the abdominal pain.    Past Medical History  Diagnosis Date  . Chronic combined systolic and diastolic CHF (congestive heart failure) ([facility]     a. 02/2014 Echo: EF 20-25% (new), Gr 2 DD, mod conc LVH, mild AI/AS, mod MR, sev dil LA, mild TR.  .Marland KitchenNonischemic cardiomyopathy ([facility] [place]     a. 02/2014 Echo: EF 20-25%;  b. 03/2014 Cath: LM nl, LAD min irregs, LCX   . Hypertension   . Chronic kidney disease (CKD), stage IV (severe) ([facility] 12/2009    a. baseline creat now ~ 4.  . Erectile dysfunction   . Colonic polyp   . Low serum testosterone level   . Tobacco abuse   . Benign prostatic hypertrophy   . Obesity, Class I, BMI 30-34.9   . Hyperlipidemia   . Adenomatous colon polyp 2006  . H. pylori infection 2013    treated with prevpac  . Mild Ao Stenosis and Insufficiency   . AICD (automatic cardioverter/defibrillator) present     STJ device, implanted 12/03/14 Dr. TLovena Le .  Sleep apnea 2010    Initially unable to afford CPAP  . 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 Gout     Past Surgical History  Procedure Laterality Date  . Retinal laser procedure      diabetic retinopathy  . Lymph node biopsy      surgical exploration of neck-not entirely clear that this represented a lymph node biopsy  . Colonoscopy  08/23/2004    Dr. Vivi Ferns rectum, diminutive polyp of the rectosigmoid removed, inflamed focally adenomatous polyp  . Left and right heart catheterization with coronary angiogram N/A 03/19/2014    Procedure: LEFT AND RIGHT HEART CATHETERIZATION WITH  CORONARY ANGIOGRAM;  Surgeon: Blane Ohara, MD;  Location[facility] C[facility] LAB;  Service: Cardiovascular;  Laterality: N/A;  . Insertion of icd  12/03/2014  . Cardiac catheterization    . Ep implantable device N/A 12/03/2014    Procedure: ICD Implant;  Surgeon: Evans Lance, MD;  Location: [place] CV LAB;  Service: Cardiovascular;  Laterality: N/A;  . Esophagogastroduodenoscopy  2013    Dr. Gala Romney: erosions reminiscent of GAVE but biopsies showed H.pylori gastritis  . Colonoscopy  2013    Dr. Gala Romney: multiple diminutive polyps in distal sigmoid segment, 4 mm pedunculated polyp at hepatic flexure. Tubular adenomas. Surveillance due 2018     Family History  Problem Relation Age of Onset  . Hypertension Mother   . Cancer Mother   . Cancer Father   . Diabetes Sister    Social History:  reports that he has been smoking Cigarettes.  He started smoking about 56 years ago. He has a 28 pack-year smoking history. He has never used smokeless tobacco. He reports that he does not drink alcohol or use illicit drugs.  Allergies:  Allergies  Allergen Reactions  . Ace Inhibitors Cough    OUTPATIENT MEDICATIONS: No current facility-administered medications on file prior to encounter.   Current Outpatient Prescriptions on File Prior to Encounter  Medication Sig Dispense Refill  . aspirin EC 81 MG tablet Take 81 mg by mouth daily.    Marland Kitchen COLCRYS 0.6 MG tablet Take 2 tabs at first sign of gout and repeat in 1 hour if pain persist s 30 tablet 3  . hydrALAZINE (APRESOLINE) 100 MG tablet Take 0.5 tablets (50 mg total) by mouth 2 (two) times daily. 90 tablet 3  . Insulin Glargine (LANTUS SOLOSTAR) 100 UNIT/ML Solostar Pen Inject 30 Units into the skin daily at 10 pm. (Patient taking differently: Inject 30 Units into the skin daily. ) 5 pen 3  . metoprolol succinate (TOPROL-XL) 50 MG 24 hr tablet Take 25 mg by mouth daily.    . potassium chloride SA (K-DUR,KLOR-CON) 20 MEQ tablet Take 1.5 tablets (30 mEq  total) by mouth 3 (three) times daily. 90 tablet 3  . torsemide (DEMADEX) 100 MG tablet Take 1 tablet (100 mg total) by mouth 2 (two) times daily. 180 tablet 3  . metolazone (ZAROXOLYN) 2.5 MG tablet Take 1 tablet by mouth three times a week as needed for swelling (Patient taking differently: Take 2.5 mg by mouth as needed (Swelling). ) 10 tablet 1  . ONE TOUCH ULTRA TEST test strip Check blood sugar each morning fasting and once daily 2 hours after a meal 100 each 5   CURRENT MEDICATIONS: Scheduled Meds: . aspirin EC  81 mg Oral Daily  . cloNIDine  0.3 mg Oral Daily  . heparin subcutaneous  5,000 Units Subcutaneous  3 times per day  . hydrALAZINE  50 mg Oral BID  . insulin aspart  0-9 Units Subcutaneous TID WC  . insulin glargine  10 Units Subcutaneous Daily  . metoprolol succinate  25 mg Oral Daily  . torsemide  100 mg Oral BID   Continuous Infusions:  PRN Meds:.albuterol, morphine injection, ondansetron **OR** ondansetron (ZOFRAN) IV, zolpidem   Results for orders placed or performed during the hospital encounter of 01/03/15 (from the past 48 hour(s))  Glucose, capillary     Status: Abnormal   Collection Time: 01/04/15 11:06 AM  Result Value Ref Range   Glucose-Capillary 106 (H) 65 - 99 mg/dL   Comment 1 Notify RN    Comment 2 Document in Chart   Glucose, capillary     Status: Abnormal   Collection Time: 01/04/15  4:20 PM  Result Value Ref Range   Glucose-Capillary 110 (H) 65 - 99 mg/dL   Comment 1 Notify RN    Comment 2 Document in Chart   Glucose, capillary     Status: None   Collection Time: 01/04/15  9:54 PM  Result Value Ref Range   Glucose-Capillary 99 65 - 99 mg/dL  Glucose, capillary     Status: None   Collection Time: 01/05/15  7:30 AM  Result Value Ref Range   Glucose-Capillary 85 65 - 99 mg/dL   Comment 1 Notify RN   Comprehensive metabolic panel     Status: Abnormal   Collection Time: 01/05/15  9:36 AM  Result Value Ref Range   Sodium 138 135 - 145 mmol/L    Potassium 3.5 3.5 - 5.1 mmol/L   Chloride 101 101 - 111 mmol/L   CO2 25 22 - 32 mmol/L   Glucose, Bld 117 (H) 65 - 99 mg/dL   BUN 92 (H) 6 - 20 mg/dL   Creatinine, Ser 5.18 (H) 0.61 - 1.24 mg/dL   Calcium 9.1 8.9 - 10.3 mg/dL   Total Protein 6.2 (L) 6.5 - 8.1 g/dL   Albumin 3.7 3.5 - 5.0 g/dL   AST 43 (H) 15 - 41 U/L   ALT 134 (H) 17 - 63 U/L   Alkaline Phosphatase 115 38 - 126 U/L   Total Bilirubin 0.8 0.3 - 1.2 mg/dL   GFR calc non Af Amer 10 (L) >60 mL/min   GFR calc Af Amer 12 (L) >60 mL/min    Comment: (NOTE) The eGFR has been calculated using the CKD EPI equation. This calculation has not been validated in all clinical situations. eGFR's persistently <60 mL/min signify possible Chronic Kidney Disease.    Anion gap 12 5 - 15  CBC     Status: Abnormal   Collection Time: 01/05/15  9:36 AM  Result Value Ref Range   WBC 5.6 4.0 - 10.5 K/uL   RBC 3.13 (L) 4.22 - 5.81 MIL/uL   Hemoglobin 9.6 (L) 13.0 - 17.0 g/dL   HCT 27.8 (L) 39.0 - 52.0 %   MCV 88.8 78.0 - 100.0 fL   MCH 30.7 26.0 - 34.0 pg   MCHC 34.5 30.0 - 36.0 g/dL   RDW 15.2 11.5 - 15.5 %   Platelets 175 150 - 400 K/uL  Lipase, blood     Status: None   Collection Time: 01/05/15  9:36 AM  Result Value Ref Range   Lipase 50 11 - 51 U/L  Glucose, capillary     Status: Abnormal   Collection Time: 01/05/15 11:52 AM  Result Value Ref Range  Glucose-Capillary 129 (H) 65 - 99 mg/dL  Glucose, capillary     Status: Abnormal   Collection Time: 01/05/15  3:14 PM  Result Value Ref Range   Glucose-Capillary 135 (H) 65 - 99 mg/dL  APTT     Status: None   Collection Time: 01/05/15  5:11 PM  Result Value Ref Range   aPTT 32 24 - 37 seconds  Glucose, capillary     Status: Abnormal   Collection Time: 01/05/15  5:23 PM  Result Value Ref Range   Glucose-Capillary 102 (H) 65 - 99 mg/dL   Comment 1 Notify RN   Glucose, capillary     Status: Abnormal   Collection Time: 01/05/15  9:39 PM  Result Value Ref Range    Glucose-Capillary 125 (H) 65 - 99 mg/dL   Comment 1 Notify RN    Comment 2 Document in Chart   Comprehensive metabolic panel     Status: Abnormal   Collection Time: 01/06/15  6:04 AM  Result Value Ref Range   Sodium 140 135 - 145 mmol/L   Potassium 3.3 (L) 3.5 - 5.1 mmol/L   Chloride 100 (L) 101 - 111 mmol/L   CO2 25 22 - 32 mmol/L   Glucose, Bld 69 65 - 99 mg/dL   BUN 88 (H) 6 - 20 mg/dL   Creatinine, Ser 5.40 (H) 0.61 - 1.24 mg/dL   Calcium 9.5 8.9 - 10.3 mg/dL   Total Protein 5.8 (L) 6.5 - 8.1 g/dL   Albumin 3.4 (L) 3.5 - 5.0 g/dL   AST 26 15 - 41 U/L   ALT 99 (H) 17 - 63 U/L   Alkaline Phosphatase 108 38 - 126 U/L   Total Bilirubin 0.8 0.3 - 1.2 mg/dL   GFR calc non Af Amer 10 (L) >60 mL/min   GFR calc Af Amer 11 (L) >60 mL/min    Comment: (NOTE) The eGFR has been calculated using the CKD EPI equation. This calculation has not been validated in all clinical situations. eGFR's persistently <60 mL/min signify possible Chronic Kidney Disease.    Anion gap 15 5 - 15  CBC     Status: Abnormal   Collection Time: 01/06/15  6:04 AM  Result Value Ref Range   WBC 5.8 4.0 - 10.5 K/uL   RBC 3.17 (L) 4.22 - 5.81 MIL/uL   Hemoglobin 9.4 (L) 13.0 - 17.0 g/dL   HCT 28.3 (L) 39.0 - 52.0 %   MCV 89.3 78.0 - 100.0 fL   MCH 29.7 26.0 - 34.0 pg   MCHC 33.2 30.0 - 36.0 g/dL   RDW 15.4 11.5 - 15.5 %   Platelets 184 150 - 400 K/uL  Lipase, blood     Status: None   Collection Time: 01/06/15  6:04 AM  Result Value Ref Range   Lipase 39 11 - 51 U/L  Brain natriuretic peptide     Status: Abnormal   Collection Time: 01/06/15  6:04 AM  Result Value Ref Range   B Natriuretic Peptide >4500.0 (H) 0.0 - 100.0 pg/mL  Protime-INR     Status: None   Collection Time: 01/06/15  6:04 AM  Result Value Ref Range   Prothrombin Time 14.4 11.6 - 15.2 seconds   INR 1.10 0.00 - 1.49  Glucose, capillary     Status: None   Collection Time: 01/06/15  6:36 AM  Result Value Ref Range   Glucose-Capillary 70  65 - 99 mg/dL   Comment 1 Notify RN  Comment 2 Document in Chart   Glucose, capillary     Status: Abnormal   Collection Time: 01/06/15  7:44 AM  Result Value Ref Range   Glucose-Capillary 110 (H) 65 - 99 mg/dL   US Abdomen Complete  01/05/2015  CLINICAL DATA:  Elevated LFTs.  Pancreatitis. EXAM: ABDOMEN ULTRASOUND COMPLETE COMPARISON:  Renal ultrasound - 05/21/2014 ; CT abdomen pelvis - 01/03/2015 FINDINGS: Gallbladder: There are several echogenic shadowing stones seen within otherwise normal-appearing gallbladder (representative image 12). Additionally, there is a approximately 0.8 x 0.6 cm echogenic structure within the nondependent portion of the gallbladder (images 17 and 21) which may represent either an adherent stone versus a gallbladder polyp. No gallbladder wall thickening or pericholecystic fluid. Negative sonographic Murphy's sign. Common bile duct: Diameter: Normal in size measuring 5 mm in diameter Liver: There is an approximately 3.5 x 3.8 x 2.7 cm anechoic cyst within the subcapsular aspect of the left lobe of the liver. There is an additional punctate (approximately 0.7 cm) hypo attenuating lesion in the subcapsular aspect the right lobe of the liver which is too small to accurately characterize though favored to represent additional hepatic cysts. Otherwise, normal appearance of the liver. No additional hepatic lesions identified. No intrahepatic biliary duct dilatation. No ascites. IVC: No abnormality visualized. Pancreas: Limited visualization of the pancreatic head and neck is normal. Visualization of the pancreatic body and tail is obscured by bowel gas. Spleen: Obscured by bowel gas Right Kidney: Normal in size measuring 11.5 cm in diameter. There is preserved renal cortical thickness, however there is diffuse increased renal cortical echogenicity (representative image 95), similar to renal ultrasound performed 05/2014. Grossly unchanged approximately 1.8 x 1.6 x 1.8 cm partially  exophytic cyst arising from the superior pole the right kidney, unchanged since the 05/2014 examination. No evidence of right-sided urinary obstruction. No definite echogenic renal stones. Left Kidney: Normal in size measuring 11.8 cm in length. There is preserved left renal cortical thickness though there is unchanged mild diffuse increased echogenicity of the left renal parenchyma. Unchanged mild lobularity of the left renal contour. Grossly unchanged approximately 2.5 x 2.1 x 2.9 cm partially exophytic cyst arising from the superior pole the left kidney, similar to the 05/2014 examination. No echogenic renal stones. No evidence of left-sided urinary obstruction. Abdominal aorta: No aneurysm visualized. Other findings: None. IMPRESSION: 1. Cholelithiasis without evidence cholecystitis. 2. Limited visualization of the pancreatic head is normal though the pancreas is overall suboptimally evaluated due to overlying bowel gas. Clinical history of pancreatitis in the setting of gallstones does raise the possibility of gallstone pancreatitis. Clinical correlation is advised. Further evaluation with ERCP or MRCP could be performed as indicated. 3. Suspected approximately 0.8 cm echogenic adherent stone versus polyp within otherwise normal-appearing gallbladder. If cholecystectomy is not pursued, a follow-up gallbladder ultrasound in 6-12 months is recommended to ensure stability and/or resolution. 4. Grossly unchanged bilateral renal cysts. 5. Grossly unchanged increased echogenicity of the bilateral renal parenchyma suggest medical renal disease. No evidence of urinary obstruction. Electronically Signed   By: Sandi Mariscal M.D.   On: 01/05/2015 11:41    ROS: General:no colds or fevers, no weight changes at home he weighs 193-195 Skin:no rashes or ulcers HEENT:no blurred vision, no congestion CV:see HPI PUL:see HPI GI:no diarrhea constipation or melena, no indigestion GU:no hematuria, no dysuria MS:no joint pain,  no claudication Neuro:no syncope, no lightheadedness Endo:+ diabetes, no thyroid disease   Blood pressure 160/56, pulse 80, temperature 98.1 F (36.7 C), temperature source Oral,  resp. rate 18, height 5' 9"  (1.753 m), weight 193 lb (87.544 kg), SpO2 96 %.  Wt Readings from Last 3 Encounters:  01/06/15 193 lb (87.544 kg)  12/18/14 197 lb (89.359 kg)  12/04/14 189 lb 9.5 oz (86 kg)    PE: General:Pleasant affect, NAD Skin:Warm and dry, brisk capillary refill HEENT:normocephalic, sclera clear, mucus membranes moist Neck:supple, no JVD, no bruits  Heart:S1S2 RRR without murmur, gallup, rub or click Lungs: without rales, + rhonchi, or wheezes MGQ:QPYP, non tender, + BS, do not palpate liver spleen or masses Ext:no lower ext edema, 2+ pedal pulses, 2+ radial pulses Neuro:alert and oriented X 3, MAE, follows commands, + facial symmetry    Assessment/Plan Active Problems:   Diabetes mellitus type 2 with retinopathy (HCC)   Hypokalemia   HTN (hypertension)   Nonischemic cardiomyopathy (HCC)   Ventricular tachycardia (HCC)   Gallstone pancreatitis   Pancreatitis   Acute biliary pancreatitis  Cardiac eval for surgery. His NICM seems stable.  His BNP may be elevated due to the LV dysfunction but 11 months ago his BNP was > 4500 as well.  May be chronic for him.  With his low EF, DM,  he is still high risk for surgery but he is euvolemic - monitor fluids.  Dr. Mare Ferrari to see for further recommendations.  Possible CXR.      [place]  Nurse Practitioner Certified [place] Pager [phone] or after 5pm or weekends call [phone] 01/06/2015, 10:23 AM  Agree with assessment and plan as noted above.  The patient denies any recent chest pain or shortness of breath.  He has not had any shocks from his [employer] ICD.  No peripheral edema.  Examination reveals clear lungs.  The heart reveals a grade 2/6 systolic ejection murmur at the base as well as a soft diastolic  murmur of aortic insufficiency.  The abdomen is nontender.  Extremities show no phlebitis or edema.  His chest x-ray of 4 weeks ago was reviewed and does not show any evidence of CHF.  The patient is cleared from a cardiac standpoint for  gallbladder surgery in a.m.

## 2015-01-06 NOTE — Progress Notes (Signed)
PROGRESS NOTE  [name] [id number] DOB: [birth year] DOA: 01/03/2015 PCP: Odette Fraction, MD  Brief History 69 year old male with a history of systolic and diastolic CHF, hypertension, CKD stage IV, hyperlipidemia, AICD (12/03/14), diabetes mellitus type 2 presented to the emergency department on 01/03/2015 with abdominal pain of one-day duration. He developed pain after he drinks 2 cups of coffee and ate a biscuit with sausage. Initial evaluation in the emergency department revealed AST 554, ALT 274, lipase 1757, bilirubin 1.5 and serum creatinine 5.05. Gastroenterology was consulted at [facility] who felt the patient had acute biliary pancreatitis. CT of the abdomen and pelvis revealed small peripheral RLL consolidation, left renal complex cyst, cholelithiasis without any pericholecystic inflammation or biliary ductal dilatation. 01/05/2015 abdominal ultrasound showed cholelithiasis without cholecystitis with a suspected 0.8 cm echogenic adherent stone versus polyp in the gallbladder. The patient was seen by general surgery, Dr. Arnoldo Morale, who recommended transfer to [facility] for cholecystectomy due to the patient's multiple comorbidities.  Assessment/Plan: Gallstone pancreatitis -Continue clear liquids. -abdominal pain is improved.  -LFTs have decreased significantly. -Seen by GI and surgery at [facility]-->transferred to [facility] due to medical complexity -No plan for ERCP at present. -01/05/2015 --Abdominal ultrasound- cholelithiasis without cholecystitis with a suspected 0.8 cm echogenic adherent stone versus polyp in the gallbladder -Appreciate Dr. Georgette Dover  RLL Infiltrate -CT abdomen shows infiltrate of the right lung base, however patient isn't having any signs and symptoms of pneumonia at this time and as such will not initiate antibiotics.  Chronic kidney disease stage V -Not on dialysis. -Baseline creatinine 4.8-5.1  -Continue outpatient follow-up with nephrology, may require  inpatient nephrology consultation if creatinine continues to worsen.   Nonischemic cardiomyopathy  -04/05/2014 echo EF 20-25% and grade 2 diastolic dysfunction. -Patient is clinically compensated at this time -Patient is status post AICD placement. -resume home dose of torsemide 100 mg twice a day -Dry weight 190-193 pounds -Daily weights  -Continue metoprolol succinate  -I have consulted cardiology for cardiac clearance  Diabetes mellitus -hold lantus  -novolog SSI -01/03/2015 hemoglobin A1c 5.9  Hypertension -Continue metoprolol succinate, hydralazine, clonidine Hypokalemia  -Replete  -Check magnesium    Code Status: Full code  Family Communication: Wife updated at bedside  Disposition Plan: To be determined  Consultants:  Dr. Laural Golden, GI   Dr. Arnoldo Morale, surgery   Dr. Georgette Dover, surgery         Procedures/Studies: Ct Abdomen Pelvis Wo Contrast  01/03/2015  CLINICAL DATA:  Right upper and right lower quadrant pain. Pain radiates to the flank and back. EXAM: CT ABDOMEN AND PELVIS WITHOUT CONTRAST TECHNIQUE: Multidetector CT imaging of the abdomen and pelvis was performed following the standard protocol without IV contrast. COMPARISON:  Renal ultrasound 05/21/2014 FINDINGS: Lower chest: Small focal parenchymal opacity in the posterior lateral right lower lobe. Minimal pleural thickening bilaterally. Punctate granuloma in the right lower lobe. Multi chamber cardiomegaly. Pacemaker wires are partially included. Liver: Scattered hypodense lesions in the liver, majority sub centimeter and too small to accurately characterize, largest in the left lobe measures 3.7 cm. Hepatobiliary: Layering gallstones within the gallbladder. Gallbladder physiologically distended. No pericholecystic inflammation. No evidence of biliary dilatation, however common bile duct is suboptimally defined. Pancreas: No ductal dilatation or surrounding inflammation. Spleen: No focal lesion. Adrenal glands: No  nodule. Kidneys: No hydronephrosis or urolithiasis. Multiple bilateral renal lesions, majority small in size and representing simple cysts. However, there is a hyperdense 9 mm lesion in the medial  mid left kidney, with a complex appearing 2 cm hypodense lesion in the left mid upper pole. Stomach/Bowel: Stomach physiologically distended. There are no dilated or thickened small bowel loops. Small volume of stool throughout the colon without colonic wall thickening. The appendix is normal. Vascular/Lymphatic: No retroperitoneal adenopathy. Abdominal aorta is normal in caliber. Moderate atherosclerosis and tortuosity of the abdominal aorta without aneurysm. Reproductive: Prostate gland normal in size, central prostatic calcification. Bladder: Physiologically distended, no wall thickening. Other: No free air, free fluid, or intra-abdominal fluid collection. Small fat containing umbilical hernia. Musculoskeletal: There are no acute or suspicious osseous abnormalities. Degenerative change in the lumbar spine. IMPRESSION: 1. Small peripheral consolidation in the right lung base, this abuts the pleura and may be the cause of patient's right-sided pain. This likely represent small focal pneumonia. Chest radiograph correlation may be helpful, particularly for follow-up which is recommended in 3-4 weeks. 2. No acute intra-abdominal/pelvic abnormality. 3. Bilateral renal lesions, incompletely characterized without contrast. Majority of these are small and appear to represent cysts. Question of complex cystic lesion in the left kidney, corresponding to that seen on prior ultrasound. Additionally there is a hyperdense subcentimeter lesion in the left kidney. Nonemergent renal protocol MRI with and with contrast is recommended for further characterization. Electronically Signed   By: Jeb Levering M.D.   On: 01/03/2015 19:16   US Abdomen Complete  01/05/2015  CLINICAL DATA:  Elevated LFTs.  Pancreatitis. EXAM: ABDOMEN  ULTRASOUND COMPLETE COMPARISON:  Renal ultrasound - 05/21/2014 ; CT abdomen pelvis - 01/03/2015 FINDINGS: Gallbladder: There are several echogenic shadowing stones seen within otherwise normal-appearing gallbladder (representative image 12). Additionally, there is a approximately 0.8 x 0.6 cm echogenic structure within the nondependent portion of the gallbladder (images 17 and 21) which may represent either an adherent stone versus a gallbladder polyp. No gallbladder wall thickening or pericholecystic fluid. Negative sonographic Murphy's sign. Common bile duct: Diameter: Normal in size measuring 5 mm in diameter Liver: There is an approximately 3.5 x 3.8 x 2.7 cm anechoic cyst within the subcapsular aspect of the left lobe of the liver. There is an additional punctate (approximately 0.7 cm) hypo attenuating lesion in the subcapsular aspect the right lobe of the liver which is too small to accurately characterize though favored to represent additional hepatic cysts. Otherwise, normal appearance of the liver. No additional hepatic lesions identified. No intrahepatic biliary duct dilatation. No ascites. IVC: No abnormality visualized. Pancreas: Limited visualization of the pancreatic head and neck is normal. Visualization of the pancreatic body and tail is obscured by bowel gas. Spleen: Obscured by bowel gas Right Kidney: Normal in size measuring 11.5 cm in diameter. There is preserved renal cortical thickness, however there is diffuse increased renal cortical echogenicity (representative image 95), similar to renal ultrasound performed 05/2014. Grossly unchanged approximately 1.8 x 1.6 x 1.8 cm partially exophytic cyst arising from the superior pole the right kidney, unchanged since the 05/2014 examination. No evidence of right-sided urinary obstruction. No definite echogenic renal stones. Left Kidney: Normal in size measuring 11.8 cm in length. There is preserved left renal cortical thickness though there is  unchanged mild diffuse increased echogenicity of the left renal parenchyma. Unchanged mild lobularity of the left renal contour. Grossly unchanged approximately 2.5 x 2.1 x 2.9 cm partially exophytic cyst arising from the superior pole the left kidney, similar to the 05/2014 examination. No echogenic renal stones. No evidence of left-sided urinary obstruction. Abdominal aorta: No aneurysm visualized. Other findings: None. IMPRESSION: 1. Cholelithiasis without evidence  cholecystitis. 2. Limited visualization of the pancreatic head is normal though the pancreas is overall suboptimally evaluated due to overlying bowel gas. Clinical history of pancreatitis in the setting of gallstones does raise the possibility of gallstone pancreatitis. Clinical correlation is advised. Further evaluation with ERCP or MRCP could be performed as indicated. 3. Suspected approximately 0.8 cm echogenic adherent stone versus polyp within otherwise normal-appearing gallbladder. If cholecystectomy is not pursued, a follow-up gallbladder ultrasound in 6-12 months is recommended to ensure stability and/or resolution. 4. Grossly unchanged bilateral renal cysts. 5. Grossly unchanged increased echogenicity of the bilateral renal parenchyma suggest medical renal disease. No evidence of urinary obstruction. Electronically Signed   By: Sandi Mariscal M.D.   On: 01/05/2015 11:41         Subjective: Patient denies fevers, chills, headache, chest pain, dyspnea, nausea, vomiting, diarrhea, abdominal pain, dysuria, hematuria   Objective: Filed Vitals:   01/05/15 1409 01/05/15 2017 01/06/15 0032 01/06/15 0610  BP: 154/56 163/47 143/47 168/56  Pulse: 78 92 81 85  Temp: 97.6 F (36.4 C) 98.5 F (36.9 C) 98 F (36.7 C) 98.1 F (36.7 C)  TempSrc: Oral Oral Oral Oral  Resp: 18 18 18 18   Height: 5\' 9"  (1.753 m)     Weight: 88.7 kg (195 lb 8.8 oz)   87.544 kg (193 lb)  SpO2: 100% 97% 99% 99%    Intake/Output Summary (Last 24 hours) at  01/06/15 0644 Last data filed at 01/06/15 [id number]  Gross per 24 hour  Intake    240 ml  Output    325 ml  Net    -85 ml   Weight change:  Exam:   General:  Pt is alert, follows commands appropriately, not in acute distress  HEENT: No icterus, No thrush, No neck mass, [place]/AT  Cardiovascular: RRR, S1/S2, no rubs, no gallops  Respiratory: CTA bilaterally, no wheezing, no crackles, no rhonchi  Abdomen: Soft/+BS, non tender, non distended, no guarding; no hepatosplenomegaly  Extremities: trace LE edema, No lymphangitis, No petechiae, No rashes, no synovitis; clubbing without cyanosis   Data Reviewed: Basic Metabolic Panel:  Recent Labs Lab 01/01/15 1052 01/03/15 1707 01/04/15 0622 01/05/15 0936  NA 139 140 138 138  K 3.4* 3.6 3.6 3.5  CL 100 97* 99* 101  CO2 26 28 27 25   GLUCOSE 137* 85 79 117*  BUN 93* 96* 91* 92*  CREATININE 5.14* 5.05* 5.12* 5.18*  CALCIUM 8.5* 9.2 8.7* 9.1   Liver Function Tests:  Recent Labs Lab 01/03/15 1707 01/04/15 0622 01/05/15 0936  AST 554* 178* 43*  ALT 274* 215* 134*  ALKPHOS 132* 123 115  BILITOT 1.5* 0.5 0.8  PROT 6.5 6.0* 6.2*  ALBUMIN 3.8 3.3* 3.7    Recent Labs Lab 01/03/15 1707 01/04/15 0622 01/05/15 0936  LIPASE 1757* 312* 50   No results for input(s): AMMONIA in the last 168 hours. CBC:  Recent Labs Lab 01/03/15 1707 01/04/15 0622 01/05/15 0936  WBC 5.3 5.3 5.6  NEUTROABS 3.7  --   --   HGB 10.2* 9.4* 9.6*  HCT 30.7* 28.1* 27.8*  MCV 89.8 89.5 88.8  PLT 188 161 175   Cardiac Enzymes: No results for input(s): CKTOTAL, CKMB, CKMBINDEX, TROPONINI in the last 168 hours. BNP: Invalid input(s): POCBNP CBG:  Recent Labs Lab 01/05/15 0730 01/05/15 1152 01/05/15 1514 01/05/15 1723 01/05/15 2139  GLUCAP 85 129* 135* 102* 125*    No results found for this or any previous visit (from the past 240 hour(s)).  Scheduled Meds: . aspirin EC  81 mg Oral Daily  . cloNIDine  0.3 mg Oral Daily  . heparin  subcutaneous  5,000 Units Subcutaneous 3 times per day  . hydrALAZINE  50 mg Oral BID  . insulin aspart  0-9 Units Subcutaneous TID WC  . insulin glargine  30 Units Subcutaneous Daily  . metoprolol succinate  25 mg Oral Daily  . torsemide  100 mg Oral BID   Continuous Infusions:    Conn Trombetta, DO  [facility] Pager [phone]  If 7PM-7AM, please contact night-coverage [email or web address] Password [facility] 01/06/2015, 6:44 AM   LOS: 3 days

## 2015-01-06 NOTE — Progress Notes (Signed)
The patient's CBG was 70 this morning and he was NPO.  He was given 1/2 tube (25 mL) of 50% dextrose per hypoglycemia protocol.  Tylene Fantasia was notified.  He was 110 upon the recheck.

## 2015-01-06 NOTE — Care Management Obs Status (Signed)
[facility] NOTIFICATION   Patient Details  Name: DORRIEN OBIE MRN: [id number] Date of Birth: [birth year]   Medicare Observation Status Notification Given:  Yes    Royston Bake, RN 01/06/2015, 3:29 PM

## 2015-01-07 ENCOUNTER — Encounter (HOSPITAL_COMMUNITY)
Admission: EM | Disposition: A | Discharge status: Home / Self Care | Payer: Self-pay | Source: Home / Self Care | Attending: Internal Medicine

## 2015-01-07 ENCOUNTER — Observation Stay (HOSPITAL_COMMUNITY): Payer: Medicare Other

## 2015-01-07 ENCOUNTER — Observation Stay (HOSPITAL_COMMUNITY): Payer: Medicare Other | Admitting: Anesthesiology

## 2015-01-07 DIAGNOSIS — K8010 Calculus of gallbladder with chronic cholecystitis without obstruction: Secondary | ICD-10-CM | POA: Diagnosis not present

## 2015-01-07 DIAGNOSIS — E1121 Type 2 diabetes mellitus with diabetic nephropathy: Secondary | ICD-10-CM

## 2015-01-07 DIAGNOSIS — K851 Biliary acute pancreatitis without necrosis or infection: Secondary | ICD-10-CM | POA: Diagnosis not present

## 2015-01-07 HISTORY — PX: UMBILICAL HERNIA REPAIR: SHX196

## 2015-01-07 HISTORY — PX: CHOLECYSTECTOMY: SHX55

## 2015-01-07 LAB — GLUCOSE, CAPILLARY
GLUCOSE-CAPILLARY: 120 mg/dL — AB (ref 65–99)
Glucose-Capillary: 99 mg/dL (ref 65–99)
Glucose-Capillary: 99 mg/dL (ref 65–99)
Glucose-Capillary: 130 mg/dL — ABNORMAL HIGH (ref 65–99)
Glucose-Capillary: 110 mg/dL — ABNORMAL HIGH (ref 65–99)

## 2015-01-07 LAB — COMPREHENSIVE METABOLIC PANEL
ALBUMIN: 3.5 g/dL (ref 3.5–5.0)
ALK PHOS: 97 U/L (ref 38–126)
ALT: 74 U/L — ABNORMAL HIGH (ref 17–63)
AST: 23 U/L (ref 15–41)
Anion gap: 15 (ref 5–15)
BILIRUBIN TOTAL: 0.7 mg/dL (ref 0.3–1.2)
BUN: 85 mg/dL — AB (ref 6–20)
CALCIUM: 9.2 mg/dL (ref 8.9–10.3)
CO2: 22 mmol/L (ref 22–32)
Chloride: 101 mmol/L (ref 101–111)
Creatinine, Ser: 5.53 mg/dL — ABNORMAL HIGH (ref 0.61–1.24)
GFR calc Af Amer: 11 mL/min — ABNORMAL LOW (ref >60)
GFR calc non Af Amer: 9 mL/min — ABNORMAL LOW (ref >60)
GLUCOSE: 107 mg/dL — AB (ref 65–99)
Potassium: 3.5 mmol/L (ref 3.5–5.1)
Sodium: 138 mmol/L (ref 135–145)
TOTAL PROTEIN: 6 g/dL — AB (ref 6.5–8.1)

## 2015-01-07 LAB — IRON AND TIBC
IRON: 42 ug/dL — AB (ref 45–182)
Saturation Ratios: 18 % (ref 17.9–39.5)
TIBC: 237 ug/dL — ABNORMAL LOW (ref 250–450)
UIBC: 195 ug/dL

## 2015-01-07 LAB — SURGICAL PCR SCREEN
MRSA, PCR: NEGATIVE
Staphylococcus aureus: NEGATIVE

## 2015-01-07 LAB — MAGNESIUM: Magnesium: 2.1 mg/dL (ref 1.7–2.4)

## 2015-01-07 LAB — FERRITIN: FERRITIN: 1449 ng/mL — AB (ref 24–336)

## 2015-01-07 SURGERY — LAPAROSCOPIC CHOLECYSTECTOMY WITH INTRAOPERATIVE CHOLANGIOGRAM
Anesthesia: General | Site: Abdomen

## 2015-01-07 MED ORDER — FENTANYL CITRATE (PF) 250 MCG/5ML IJ SOLN
INTRAMUSCULAR | Status: AC
  Filled 2015-01-07: qty 5

## 2015-01-07 MED ORDER — BUPIVACAINE-EPINEPHRINE (PF) 0.25% -1:200000 IJ SOLN
INTRAMUSCULAR | Status: AC
  Filled 2015-01-07: qty 30

## 2015-01-07 MED ORDER — GLUCAGON HCL RDNA (DIAGNOSTIC) 1 MG IJ SOLR
INTRAMUSCULAR | Status: DC | PRN
  Administered 2015-01-07: 1 mg via INTRAVENOUS

## 2015-01-07 MED ORDER — SUCCINYLCHOLINE CHLORIDE 20 MG/ML IJ SOLN
INTRAMUSCULAR | Status: AC
  Filled 2015-01-07: qty 1

## 2015-01-07 MED ORDER — NEOSTIGMINE METHYLSULFATE 10 MG/10ML IV SOLN
INTRAVENOUS | Status: AC
  Filled 2015-01-07: qty 1

## 2015-01-07 MED ORDER — ONDANSETRON HCL 4 MG/2ML IJ SOLN
INTRAMUSCULAR | Status: AC
  Filled 2015-01-07: qty 2

## 2015-01-07 MED ORDER — HYDROMORPHONE HCL 1 MG/ML IJ SOLN
INTRAMUSCULAR | Status: AC
  Filled 2015-01-07: qty 1

## 2015-01-07 MED ORDER — DEXTROSE 5 % IV SOLN
2.0000 g | INTRAVENOUS | Status: AC
  Administered 2015-01-07: 2 g via INTRAVENOUS
  Filled 2015-01-07: qty 2

## 2015-01-07 MED ORDER — LIDOCAINE HCL (CARDIAC) 20 MG/ML IV SOLN
INTRAVENOUS | Status: AC
  Filled 2015-01-07: qty 5

## 2015-01-07 MED ORDER — NEOSTIGMINE METHYLSULFATE 10 MG/10ML IV SOLN
INTRAVENOUS | Status: DC | PRN
  Administered 2015-01-07: 4 mg via INTRAVENOUS

## 2015-01-07 MED ORDER — GLUCAGON HCL RDNA (DIAGNOSTIC) 1 MG IJ SOLR
INTRAMUSCULAR | Status: AC
  Filled 2015-01-07: qty 1

## 2015-01-07 MED ORDER — ETOMIDATE 2 MG/ML IV SOLN
INTRAVENOUS | Status: DC | PRN
  Administered 2015-01-07: 16 mg via INTRAVENOUS
  Administered 2015-01-07: 4 mg via INTRAVENOUS
  Administered 2015-01-07: 8 mg via INTRAVENOUS

## 2015-01-07 MED ORDER — 0.9 % SODIUM CHLORIDE (POUR BTL) OPTIME
TOPICAL | Status: DC | PRN
  Administered 2015-01-07: 1000 mL

## 2015-01-07 MED ORDER — ETOMIDATE 2 MG/ML IV SOLN
INTRAVENOUS | Status: AC
  Filled 2015-01-07: qty 10

## 2015-01-07 MED ORDER — METHOCARBAMOL 500 MG PO TABS: 500.0000 mg | ORAL_TABLET | Freq: Three times a day (TID) | ORAL | Status: DC | PRN

## 2015-01-07 MED ORDER — PROPOFOL 10 MG/ML IV BOLUS
INTRAVENOUS | Status: AC
  Filled 2015-01-07: qty 20

## 2015-01-07 MED ORDER — LIDOCAINE HCL (CARDIAC) 20 MG/ML IV SOLN
INTRAVENOUS | Status: DC | PRN
  Administered 2015-01-07: 100 mg via INTRAVENOUS

## 2015-01-07 MED ORDER — SODIUM CHLORIDE 0.9 % IV SOLN
INTRAVENOUS | Status: DC
  Administered 2015-01-07: 10:00:00 via INTRAVENOUS

## 2015-01-07 MED ORDER — ROCURONIUM BROMIDE 50 MG/5ML IV SOLN
INTRAVENOUS | Status: AC
  Filled 2015-01-07: qty 1

## 2015-01-07 MED ORDER — GLYCOPYRROLATE 0.2 MG/ML IJ SOLN
INTRAMUSCULAR | Status: DC | PRN
  Administered 2015-01-07: 0.6 mg via INTRAVENOUS

## 2015-01-07 MED ORDER — HEMOSTATIC AGENTS (NO CHARGE) OPTIME
TOPICAL | Status: DC | PRN
  Administered 2015-01-07: 1 via TOPICAL

## 2015-01-07 MED ORDER — MIDAZOLAM HCL 2 MG/2ML IJ SOLN
INTRAMUSCULAR | Status: AC
  Filled 2015-01-07: qty 2

## 2015-01-07 MED ORDER — SODIUM CHLORIDE 0.9 % IJ SOLN
INTRAMUSCULAR | Status: AC
  Filled 2015-01-07: qty 10

## 2015-01-07 MED ORDER — HYDROMORPHONE HCL 1 MG/ML IJ SOLN
0.2500 mg | INTRAMUSCULAR | Status: DC | PRN
  Administered 2015-01-07 (×4): 0.5 mg via INTRAVENOUS

## 2015-01-07 MED ORDER — SODIUM CHLORIDE 0.9 % IV SOLN
INTRAVENOUS | Status: DC | PRN
  Administered 2015-01-07: 12:00:00

## 2015-01-07 MED ORDER — ARTIFICIAL TEARS OP OINT
TOPICAL_OINTMENT | OPHTHALMIC | Status: AC
  Filled 2015-01-07: qty 3.5

## 2015-01-07 MED ORDER — CEFTRIAXONE SODIUM 2 G IJ SOLR: 2.0000 g | INTRAMUSCULAR | Status: AC

## 2015-01-07 MED ORDER — SODIUM CHLORIDE 0.9 % IR SOLN
Status: DC | PRN
  Administered 2015-01-07: 1000 mL

## 2015-01-07 MED ORDER — BUPIVACAINE-EPINEPHRINE 0.25% -1:200000 IJ SOLN
INTRAMUSCULAR | Status: DC | PRN
  Administered 2015-01-07: 30 mL

## 2015-01-07 MED ORDER — DARBEPOETIN ALFA 100 MCG/0.5ML IJ SOSY
100.0000 ug | PREFILLED_SYRINGE | INTRAMUSCULAR | Status: DC
  Administered 2015-01-07: 100 ug via SUBCUTANEOUS
  Filled 2015-01-07: qty 0.5

## 2015-01-07 MED ORDER — EPHEDRINE SULFATE 50 MG/ML IJ SOLN
INTRAMUSCULAR | Status: AC
  Filled 2015-01-07: qty 1

## 2015-01-07 MED ORDER — GLYCOPYRROLATE 0.2 MG/ML IJ SOLN
INTRAMUSCULAR | Status: AC
  Filled 2015-01-07: qty 3

## 2015-01-07 MED ORDER — OXYCODONE HCL 5 MG PO TABS
5.0000 mg | ORAL_TABLET | ORAL | Status: DC | PRN
  Administered 2015-01-08 – 2015-01-10 (×5): 10 mg via ORAL
  Filled 2015-01-07 (×6): qty 2

## 2015-01-07 MED ORDER — MEPERIDINE HCL 25 MG/ML IJ SOLN: 6.2500 mg | INTRAMUSCULAR | Status: DC | PRN

## 2015-01-07 MED ORDER — PROMETHAZINE HCL 25 MG/ML IJ SOLN: 6.2500 mg | INTRAMUSCULAR | Status: DC | PRN

## 2015-01-07 MED ORDER — CISATRACURIUM BESYLATE (PF) 10 MG/5ML IV SOLN
INTRAVENOUS | Status: DC | PRN
  Administered 2015-01-07: 12 mg via INTRAVENOUS

## 2015-01-07 MED ORDER — FENTANYL CITRATE (PF) 100 MCG/2ML IJ SOLN
INTRAMUSCULAR | Status: DC | PRN
  Administered 2015-01-07 (×2): 25 ug via INTRAVENOUS
  Administered 2015-01-07: 50 ug via INTRAVENOUS

## 2015-01-07 MED ORDER — ONDANSETRON HCL 4 MG/2ML IJ SOLN
INTRAMUSCULAR | Status: DC | PRN
  Administered 2015-01-07: 4 mg via INTRAVENOUS

## 2015-01-07 MED ORDER — ESMOLOL HCL 100 MG/10ML IV SOLN
INTRAVENOUS | Status: DC | PRN
  Administered 2015-01-07 (×2): 30 mg via INTRAVENOUS
  Administered 2015-01-07: 20 mg via INTRAVENOUS

## 2015-01-07 SURGICAL SUPPLY — 54 items
APL SKNCLS STERI-STRIP NONHPOA (GAUZE/BANDAGES/DRESSINGS) ×1
APPLIER CLIP ROT 10 11.4 M/L (STAPLE) ×3
APR CLP MED LRG 11.4X10 (STAPLE) ×1
BAG SPEC RTRVL LRG 6X4 10 (ENDOMECHANICALS) ×1
BENZOIN TINCTURE PRP APPL 2/3 (GAUZE/BANDAGES/DRESSINGS) ×3 IMPLANT
BLADE SURG ROTATE 9660 (MISCELLANEOUS) ×2 IMPLANT
CANISTER SUCTION 2500CC (MISCELLANEOUS) ×3 IMPLANT
CHLORAPREP W/TINT 26ML (MISCELLANEOUS) ×3 IMPLANT
CLIP APPLIE ROT 10 11.4 M/L (STAPLE) ×1 IMPLANT
CLOSURE WOUND 1/2 X4 (GAUZE/BANDAGES/DRESSINGS) ×1
COVER MAYO STAND STRL (DRAPES) ×3 IMPLANT
COVER SURGICAL LIGHT HANDLE (MISCELLANEOUS) ×3 IMPLANT
DRAPE C-ARM 42X72 X-RAY (DRAPES) ×3 IMPLANT
DRSG TEGADERM 2-3/8X2-3/4 SM (GAUZE/BANDAGES/DRESSINGS) ×9 IMPLANT
DRSG TEGADERM 4X4.75 (GAUZE/BANDAGES/DRESSINGS) ×3 IMPLANT
ELECT CAUTERY BLADE 6.4 (BLADE) ×2 IMPLANT
ELECT REM PT RETURN 9FT ADLT (ELECTROSURGICAL) ×3
ELECTRODE REM PT RTRN 9FT ADLT (ELECTROSURGICAL) ×1 IMPLANT
FILTER SMOKE EVAC LAPAROSHD (FILTER) ×3 IMPLANT
GAUZE SPONGE 2X2 8PLY STRL LF (GAUZE/BANDAGES/DRESSINGS) ×1 IMPLANT
GLOVE BIO SURGEON STRL SZ7 (GLOVE) ×5 IMPLANT
GLOVE BIO SURGEON STRL SZ8 (GLOVE) ×2 IMPLANT
GLOVE BIOGEL PI IND STRL 7.0 (GLOVE) IMPLANT
GLOVE BIOGEL PI IND STRL 7.5 (GLOVE) ×1 IMPLANT
GLOVE BIOGEL PI IND STRL 8 (GLOVE) IMPLANT
GLOVE BIOGEL PI INDICATOR 7.0 (GLOVE) ×4
GLOVE BIOGEL PI INDICATOR 7.5 (GLOVE) ×2
GLOVE BIOGEL PI INDICATOR 8 (GLOVE) ×2
GLOVE SURG SS PI 7.0 STRL IVOR (GLOVE) ×4 IMPLANT
GOWN STRL REUS W/ TWL LRG LVL3 (GOWN DISPOSABLE) ×3 IMPLANT
GOWN STRL REUS W/TWL LRG LVL3 (GOWN DISPOSABLE) ×9
HEMOSTAT SNOW SURGICEL 2X4 (HEMOSTASIS) ×2 IMPLANT
KIT BASIN OR (CUSTOM PROCEDURE TRAY) ×3 IMPLANT
KIT ROOM TURNOVER OR (KITS) ×3 IMPLANT
NS IRRIG 1000ML POUR BTL (IV SOLUTION) ×3 IMPLANT
PAD ARMBOARD 7.5X6 YLW CONV (MISCELLANEOUS) ×3 IMPLANT
PENCIL BUTTON HOLSTER BLD 10FT (ELECTRODE) ×2 IMPLANT
POUCH SPECIMEN RETRIEVAL 10MM (ENDOMECHANICALS) ×3 IMPLANT
SCISSORS LAP 5X35 DISP (ENDOMECHANICALS) ×3 IMPLANT
SET CHOLANGIOGRAPH 5 50 .035 (SET/KITS/TRAYS/PACK) ×3 IMPLANT
SET IRRIG TUBING LAPAROSCOPIC (IRRIGATION / IRRIGATOR) ×3 IMPLANT
SLEEVE ENDOPATH XCEL 5M (ENDOMECHANICALS) ×3 IMPLANT
SPECIMEN JAR SMALL (MISCELLANEOUS) ×3 IMPLANT
SPONGE GAUZE 2X2 STER 10/PKG (GAUZE/BANDAGES/DRESSINGS) ×2
STRIP CLOSURE SKIN 1/2X4 (GAUZE/BANDAGES/DRESSINGS) ×2 IMPLANT
SUT MNCRL AB 4-0 PS2 18 (SUTURE) ×3 IMPLANT
SUT VICRYL 0 UR6 27IN ABS (SUTURE) ×2 IMPLANT
TOWEL OR 17X24 6PK STRL BLUE (TOWEL DISPOSABLE) ×3 IMPLANT
TOWEL OR 17X26 10 PK STRL BLUE (TOWEL DISPOSABLE) ×3 IMPLANT
TRAY LAPAROSCOPIC MC (CUSTOM PROCEDURE TRAY) ×3 IMPLANT
TROCAR XCEL BLUNT TIP 100MML (ENDOMECHANICALS) ×3 IMPLANT
TROCAR XCEL NON-BLD 11X100MML (ENDOMECHANICALS) ×3 IMPLANT
TROCAR XCEL NON-BLD 5MMX100MML (ENDOMECHANICALS) ×3 IMPLANT
TUBING INSUFFLATION (TUBING) ×3 IMPLANT

## 2015-01-07 NOTE — Progress Notes (Signed)
PROGRESS NOTE  [name] [id number] DOB: 04/11/1945 DOA: 01/03/2015 PCP: Odette Fraction, MD Brief History 69 year old male with a history of systolic and diastolic CHF, hypertension, CKD stage IV, hyperlipidemia, AICD (12/03/14), diabetes mellitus type 2 presented to the emergency department on 01/03/2015 with abdominal pain of one-day duration. He developed pain after he drinks 2 cups of coffee and ate a biscuit with sausage. Initial evaluation in the emergency department revealed AST 554, ALT 274, lipase 1757, bilirubin 1.5 and serum creatinine 5.05. Gastroenterology was consulted at [facility] who felt the patient had acute biliary pancreatitis. CT of the abdomen and pelvis revealed small peripheral RLL consolidation, left renal complex cyst, cholelithiasis without any pericholecystic inflammation or biliary ductal dilatation. 01/05/2015 abdominal ultrasound showed cholelithiasis without cholecystitis with a suspected 0.8 cm echogenic adherent stone versus polyp in the gallbladder. The patient was seen by general surgery, Dr. Arnoldo Morale, who recommended transfer to [facility] for cholecystectomy due to the patient's multiple comorbidities. Assessment/Plan: Gallstone pancreatitis -Continue clear liquids. -abdominal pain is improved.  -LFTs have decreased significantly. -Seen by GI and surgery at [facility]-->transferred to [facility] due to medical complexity -No plan for ERCP at present. -01/05/2015 --Abdominal ultrasound- cholelithiasis without cholecystitis with a suspected 0.8 cm echogenic adherent stone versus polyp in the gallbladder -Appreciate Dr. Georgette Dover -01/07/15--scheduled for cholecystectomy  RLL Infiltrate -CT abdomen shows infiltrate of the right lung base, however patient isn't having any signs and symptoms of pneumonia at this time and as such will not initiate antibiotics.  Chronic kidney disease stage V -Not on dialysis. -Baseline creatinine 4.8-5.1  -01/07/15-Consult  nephrology--creatinine continues to climb -continue torsemide for now as he appears hypervolemic clinically  Nonischemic cardiomyopathy  -04/05/2014 echo EF 20-25% and grade 2 diastolic dysfunction. -Patient is clinically compensated at this time -Patient is status post AICD placement. -resume home dose of torsemide 100 mg twice a day -Dry weight 190-193 pounds -Daily weights  -Continue metoprolol succinate  -Cleared for surgery by cardiology Diabetes mellitus -reduced lantus for npo status today for surgery -novolog SSI -01/03/2015 hemoglobin A1c 5.9  Hypertension -Continue metoprolol succinate, hydralazine, clonidine Hypokalemia  -Replete  -Check magnesium--2.1   Code Status: Full code  Family Communication: Wife updated at bedside  Disposition Plan: To be determined  Consultants:  Dr. Laural Golden, GI   Dr. Arnoldo Morale, surgery   Dr. Georgette Dover, surgery         Procedures/Studies: Ct Abdomen Pelvis Wo Contrast  01/03/2015  CLINICAL DATA:  Right upper and right lower quadrant pain. Pain radiates to the flank and back. EXAM: CT ABDOMEN AND PELVIS WITHOUT CONTRAST TECHNIQUE: Multidetector CT imaging of the abdomen and pelvis was performed following the standard protocol without IV contrast. COMPARISON:  Renal ultrasound 05/21/2014 FINDINGS: Lower chest: Small focal parenchymal opacity in the posterior lateral right lower lobe. Minimal pleural thickening bilaterally. Punctate granuloma in the right lower lobe. Multi chamber cardiomegaly. Pacemaker wires are partially included. Liver: Scattered hypodense lesions in the liver, majority sub centimeter and too small to accurately characterize, largest in the left lobe measures 3.7 cm. Hepatobiliary: Layering gallstones within the gallbladder. Gallbladder physiologically distended. No pericholecystic inflammation. No evidence of biliary dilatation, however common bile duct is suboptimally defined. Pancreas: No ductal dilatation or  surrounding inflammation. Spleen: No focal lesion. Adrenal glands: No nodule. Kidneys: No hydronephrosis or urolithiasis. Multiple bilateral renal lesions, majority small in size and representing simple cysts. However, there is a hyperdense 9 mm lesion in the medial  mid left kidney, with a complex appearing 2 cm hypodense lesion in the left mid upper pole. Stomach/Bowel: Stomach physiologically distended. There are no dilated or thickened small bowel loops. Small volume of stool throughout the colon without colonic wall thickening. The appendix is normal. Vascular/Lymphatic: No retroperitoneal adenopathy. Abdominal aorta is normal in caliber. Moderate atherosclerosis and tortuosity of the abdominal aorta without aneurysm. Reproductive: Prostate gland normal in size, central prostatic calcification. Bladder: Physiologically distended, no wall thickening. Other: No free air, free fluid, or intra-abdominal fluid collection. Small fat containing umbilical hernia. Musculoskeletal: There are no acute or suspicious osseous abnormalities. Degenerative change in the lumbar spine. IMPRESSION: 1. Small peripheral consolidation in the right lung base, this abuts the pleura and may be the cause of patient's right-sided pain. This likely represent small focal pneumonia. Chest radiograph correlation may be helpful, particularly for follow-up which is recommended in 3-4 weeks. 2. No acute intra-abdominal/pelvic abnormality. 3. Bilateral renal lesions, incompletely characterized without contrast. Majority of these are small and appear to represent cysts. Question of complex cystic lesion in the left kidney, corresponding to that seen on prior ultrasound. Additionally there is a hyperdense subcentimeter lesion in the left kidney. Nonemergent renal protocol MRI with and with contrast is recommended for further characterization. Electronically Signed   By: Jeb Levering M.D.   On: 01/03/2015 19:16   US Abdomen  Complete  01/05/2015  CLINICAL DATA:  Elevated LFTs.  Pancreatitis. EXAM: ABDOMEN ULTRASOUND COMPLETE COMPARISON:  Renal ultrasound - 05/21/2014 ; CT abdomen pelvis - 01/03/2015 FINDINGS: Gallbladder: There are several echogenic shadowing stones seen within otherwise normal-appearing gallbladder (representative image 12). Additionally, there is a approximately 0.8 x 0.6 cm echogenic structure within the nondependent portion of the gallbladder (images 17 and 21) which may represent either an adherent stone versus a gallbladder polyp. No gallbladder wall thickening or pericholecystic fluid. Negative sonographic Murphy's sign. Common bile duct: Diameter: Normal in size measuring 5 mm in diameter Liver: There is an approximately 3.5 x 3.8 x 2.7 cm anechoic cyst within the subcapsular aspect of the left lobe of the liver. There is an additional punctate (approximately 0.7 cm) hypo attenuating lesion in the subcapsular aspect the right lobe of the liver which is too small to accurately characterize though favored to represent additional hepatic cysts. Otherwise, normal appearance of the liver. No additional hepatic lesions identified. No intrahepatic biliary duct dilatation. No ascites. IVC: No abnormality visualized. Pancreas: Limited visualization of the pancreatic head and neck is normal. Visualization of the pancreatic body and tail is obscured by bowel gas. Spleen: Obscured by bowel gas Right Kidney: Normal in size measuring 11.5 cm in diameter. There is preserved renal cortical thickness, however there is diffuse increased renal cortical echogenicity (representative image 95), similar to renal ultrasound performed 05/2014. Grossly unchanged approximately 1.8 x 1.6 x 1.8 cm partially exophytic cyst arising from the superior pole the right kidney, unchanged since the 05/2014 examination. No evidence of right-sided urinary obstruction. No definite echogenic renal stones. Left Kidney: Normal in size measuring 11.8 cm  in length. There is preserved left renal cortical thickness though there is unchanged mild diffuse increased echogenicity of the left renal parenchyma. Unchanged mild lobularity of the left renal contour. Grossly unchanged approximately 2.5 x 2.1 x 2.9 cm partially exophytic cyst arising from the superior pole the left kidney, similar to the 05/2014 examination. No echogenic renal stones. No evidence of left-sided urinary obstruction. Abdominal aorta: No aneurysm visualized. Other findings: None. IMPRESSION: 1. Cholelithiasis without evidence  cholecystitis. 2. Limited visualization of the pancreatic head is normal though the pancreas is overall suboptimally evaluated due to overlying bowel gas. Clinical history of pancreatitis in the setting of gallstones does raise the possibility of gallstone pancreatitis. Clinical correlation is advised. Further evaluation with ERCP or MRCP could be performed as indicated. 3. Suspected approximately 0.8 cm echogenic adherent stone versus polyp within otherwise normal-appearing gallbladder. If cholecystectomy is not pursued, a follow-up gallbladder ultrasound in 6-12 months is recommended to ensure stability and/or resolution. 4. Grossly unchanged bilateral renal cysts. 5. Grossly unchanged increased echogenicity of the bilateral renal parenchyma suggest medical renal disease. No evidence of urinary obstruction. Electronically Signed   By: Sandi Mariscal M.D.   On: 01/05/2015 11:41         Subjective: Patient denies fevers, chills, headache, chest pain, dyspnea, nausea, vomiting, diarrhea, abdominal pain, dysuria, hematuria   Objective: Filed Vitals:   01/06/15 0610 01/06/15 0817 01/06/15 2110 01/07/15 0502  BP: 168/56 160/56 157/52 161/85  Pulse: 85 80 81 83  Temp: 98.1 F (36.7 C) 98.1 F (36.7 C) 98 F (36.7 C) 98.3 F (36.8 C)  TempSrc: Oral Oral Oral Oral  Resp: 18 18 18 18   Height:      Weight: 87.544 kg (193 lb)   87.091 kg (192 lb)  SpO2: 99% 96% 97%  100%    Intake/Output Summary (Last 24 hours) at 01/07/15 0941 Last data filed at 01/07/15 0920  Gross per 24 hour  Intake   1200 ml  Output   1175 ml  Net     25 ml   Weight change: -1.609 kg (-3 lb 8.8 oz) Exam:   General:  Pt is alert, follows commands appropriately, not in acute distress  HEENT: No icterus, No thrush, No neck mass, [place]/AT  Cardiovascular: RRR, S1/S2, no rubs, no gallops  Respiratory:   Abdomen: Soft/+BS, non tender, non distended, no guarding; no hepatosplenomegaly  Extremities: 1+LE edema, No lymphangitis, No petechiae, No rashes, no synovitis;  No cyanosis or clubbing  Data Reviewed: Basic Metabolic Panel:  Recent Labs Lab 01/03/15 1707 01/04/15 0622 01/05/15 0936 01/06/15 0604 01/07/15 0640  NA 140 138 138 140 138  K 3.6 3.6 3.5 3.3* 3.5  CL 97* 99* 101 100* 101  CO2 28 27 25 25 22   GLUCOSE 85 79 117* 69 107*  BUN 96* 91* 92* 88* 85*  CREATININE 5.05* 5.12* 5.18* 5.40* 5.53*  CALCIUM 9.2 8.7* 9.1 9.5 9.2  MG  --   --   --   --  2.1   Liver Function Tests:  Recent Labs Lab 01/03/15 1707 01/04/15 0622 01/05/15 0936 01/06/15 0604 01/07/15 0640  AST 554* 178* 43* 26 23  ALT 274* 215* 134* 99* 74*  ALKPHOS 132* 123 115 108 97  BILITOT 1.5* 0.5 0.8 0.8 0.7  PROT 6.5 6.0* 6.2* 5.8* 6.0*  ALBUMIN 3.8 3.3* 3.7 3.4* 3.5    Recent Labs Lab 01/03/15 1707 01/04/15 0622 01/05/15 0936 01/06/15 0604  LIPASE 1757* 312* 50 39   No results for input(s): AMMONIA in the last 168 hours. CBC:  Recent Labs Lab 01/03/15 1707 01/04/15 0622 01/05/15 0936 01/06/15 0604  WBC 5.3 5.3 5.6 5.8  NEUTROABS 3.7  --   --   --   HGB 10.2* 9.4* 9.6* 9.4*  HCT 30.7* 28.1* 27.8* 28.3*  MCV 89.8 89.5 88.8 89.3  PLT 188 161 175 184   Cardiac Enzymes: No results for input(s): CKTOTAL, CKMB, CKMBINDEX, TROPONINI in the  last 168 hours. BNP: Invalid input(s): POCBNP CBG:  Recent Labs Lab 01/06/15 0744 01/06/15 1222 01/06/15 1707 01/06/15 2113  01/07/15 0727  GLUCAP 110* 124* 133* 104* 99    No results found for this or any previous visit (from the past 240 hour(s)).   Scheduled Meds: . cefTRIAXone (ROCEPHIN)  IV  2 g Intravenous To SS-Surg  . cloNIDine  0.3 mg Oral Daily  . hydrALAZINE  50 mg Oral BID  . insulin aspart  0-9 Units Subcutaneous TID WC  . insulin glargine  10 Units Subcutaneous QHS  . metoprolol succinate  25 mg Oral Daily  . torsemide  100 mg Oral BID   Continuous Infusions:    Laykin Rainone, DO  [facility] Pager [phone]  If 7PM-7AM, please contact night-coverage [email or web address] Password [email or web address] 01/07/2015, 9:41 AM   LOS: 4 days

## 2015-01-07 NOTE — Discharge Instructions (Signed)
[facility] ______[facility] °LAPAROSCOPIC SURGERY: POST OP INSTRUCTIONS °Always review your discharge instruction sheet given to you by the facility where your surgery was performed. °IF YOU HAVE DISABILITY OR FAMILY LEAVE FORMS, YOU MUST BRING THEM TO THE OFFICE FOR PROCESSING.   °DO NOT GIVE THEM TO YOUR DOCTOR. ° °1. A prescription for pain medication may be given to you upon discharge.  Take your pain medication as prescribed, if needed.  If narcotic pain medicine is not needed, then you may take acetaminophen (Tylenol) or ibuprofen (Advil) as needed. °2. Take your usually prescribed medications unless otherwise directed. °3. If you need a refill on your pain medication, please contact your pharmacy.  They will contact our office to request authorization. Prescriptions will not be filled after 5pm or on week-ends. °4. You should follow a light diet the first few days after arrival home, such as soup and crackers, etc.  Be sure to include lots of fluids daily. °5. Most patients will experience some swelling and bruising in the area of the incisions.  Ice packs will help.  Swelling and bruising can take several days to resolve.  °6. It is common to experience some constipation if taking pain medication after surgery.  Increasing fluid intake and taking a stool softener (such as Colace) will usually help or prevent this problem from occurring.  A mild laxative (Milk of Magnesia or Miralax) should be taken according to package instructions if there are no bowel movements after 48 hours. °7. Unless discharge instructions indicate otherwise, you may remove your bandages 24-48 hours after surgery, and you may shower at that time.  You may have steri-strips (small skin tapes) in place directly over the incision.  These strips should be left on the skin for 7-10 days.  If your surgeon used skin glue on the incision, you may shower in 24 hours.  The glue will flake off over the next 2-3 weeks.  Any sutures or  staples will be removed at the office during your follow-up visit. °8. ACTIVITIES:  You may resume regular (light) daily activities beginning the next day--such as daily self-care, walking, climbing stairs--gradually increasing activities as tolerated.  You may have sexual intercourse when it is comfortable.  Refrain from any heavy lifting or straining until approved by your doctor. °a. You may drive when you are no longer taking prescription pain medication, you can comfortably wear a seatbelt, and you can safely maneuver your car and apply brakes. °b. RETURN TO WORK:  __________________________________________________________ °9. You should see your doctor in the office for a follow-up appointment approximately 2-3 weeks after your surgery.  Make sure that you call for this appointment within a day or two after you arrive home to insure a convenient appointment time. °10. OTHER INSTRUCTIONS: __________________________________________________________________________________________________________________________ __________________________________________________________________________________________________________________________ °WHEN TO CALL YOUR DOCTOR: °1. Fever over 101.0 °2. Inability to urinate °3. Continued bleeding from incision. °4. Increased pain, redness, or drainage from the incision. °5. Increasing abdominal pain ° °The clinic staff is available to answer your questions during regular business hours.  Please don’t hesitate to call and ask to speak to one of the nurses for clinical concerns.  If you have a medical emergency, go to the nearest emergency room or call 911.  A surgeon from [facility] is always on call at the hospital. °[address], [address], [place], [place]  [address] ? [address] [address], [place],    [address] °[phone] ? [phone] ? FAX [phone] °Web site:   [email or web address]   Laparoscopic Cholecystectomy, Care After Refer to this  sheet in the next few weeks. These instructions provide you with information about caring for yourself after your procedure. Your health care provider may also give you more specific instructions. Your treatment has been planned according to current medical practices, but problems sometimes occur. Call your health care provider if you have any problems or questions after your procedure. WHAT TO EXPECT AFTER THE PROCEDURE After your procedure, it is common to have:  Pain at your incision sites. You will be given pain medicines to control your pain.  Mild nausea or vomiting. This should improve after the first 24 hours.  Bloating and possible shoulder pain from the gas that was used during the procedure. This will improve after the first 24 hours. HOME CARE INSTRUCTIONS Incision Care  Follow instructions from your health care provider about how to take care of your incisions. Make sure you:  Wash your hands with soap and water before you change your bandage (dressing). If soap and water are not available, use hand sanitizer.  Change your dressing as told by your health care provider.  Leave stitches (sutures), skin glue, or adhesive strips in place. These skin closures may need to be in place for 2 weeks or longer. If adhesive strip edges start to loosen and curl up, you may trim the loose edges. Do not remove adhesive strips completely unless your health care provider tells you to do that.  Do not take baths, swim, or use a hot tub until your health care provider approves. Ask your health care provider if you can take showers. You may only be allowed to take sponge baths for bathing. General Instructions  Take over-the-counter and prescription medicines only as told by your health care provider.  Do not drive or operate heavy machinery while taking prescription pain medicine.  Return to your normal diet as told by your health care provider.  Do not lift anything that is heavier than 10 lb  (4.5 kg).  Do not play contact sports for one week or until your health care provider approves. SEEK MEDICAL CARE IF:   You have redness, swelling, or pain at the site of your incision.  You have fluid, blood, or pus coming from your incision.  You notice a bad smell coming from your incision area.  Your surgical incisions break open.  You have a fever. SEEK IMMEDIATE MEDICAL CARE IF:  You develop a rash.  You have difficulty breathing.  You have chest pain.  You have increasing pain in your shoulders (shoulder strap areas).  You faint or have dizzy episodes while you are standing.  You have severe pain in your abdomen.  You have nausea or vomiting that lasts for more than one day.   This information is not intended to replace advice given to you by your health care provider. Make sure you discuss any questions you have with your health care provider.   Document Released: 01/03/2005 Document Revised: 09/24/2014 Document Reviewed: 08/15/2012 Elsevier Interactive Patient Education [employer].

## 2015-01-07 NOTE — Consult Note (Addendum)
[name] is an 69 y.o. male referred by Dr Tat   Chief Complaint: CKD 5 HPI: 69yo BM admitted 01/03/15 for abd pain and found to have gallstone pancreatitis.  Today he underwent lap chole.   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  He has not wanted to make any decisions regarding dialysis.  Scr 4.4 last May and now 5.5.  Has been on Aranesp but has not received for over a month.  Past Medical History  Diagnosis Date  . Chronic combined systolic and diastolic CHF (congestive heart failure) ([place])     a. 02/2014 Echo: EF 20-25% (new), Gr 2 DD, mod conc LVH, mild AI/AS, mod MR, sev dil LA, mild TR.  Marland Kitchen Nonischemic cardiomyopathy ([place])     a. 02/2014 Echo: EF 20-25%;  b. 03/2014 Cath: LM nl, LAD min irregs, LCX   . Hypertension   . Chronic kidney disease (CKD), stage IV (severe) ([place]) 12/2009    a. baseline creat now ~ 4.  . Erectile dysfunction   . Colonic polyp   . Low serum testosterone level   . Tobacco abuse   . Benign prostatic hypertrophy   . Obesity, Class I, BMI 30-34.9   . Hyperlipidemia   . Adenomatous colon polyp 2006  . H. pylori infection 2013    treated with prevpac  . Mild Ao Stenosis and Insufficiency   . AICD (automatic cardioverter/defibrillator) present     STJ device, implanted 12/03/14 Dr. Lovena Le  . Sleep apnea 2010    Initially unable to afford CPAP  . 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 Gout     Past Surgical History  Procedure Laterality Date  . Retinal laser procedure      diabetic retinopathy  . Lymph node biopsy      surgical exploration of neck-not entirely clear that this represented a lymph node biopsy  . Colonoscopy  08/23/2004    Dr. Vivi Ferns rectum, diminutive polyp of the rectosigmoid removed, inflamed focally adenomatous polyp  . Left and right heart catheterization with coronary angiogram N/A 03/19/2014    Procedure: LEFT AND RIGHT HEART CATHETERIZATION WITH CORONARY ANGIOGRAM;  Surgeon:  Blane Ohara, MD;  Location: [facility] CATH LAB;  Service: Cardiovascular;  Laterality: N/A;  . Insertion of icd  12/03/2014  . Cardiac catheterization    . Ep implantable device N/A 12/03/2014    Procedure: ICD Implant;  Surgeon: Evans Lance, MD;  Location: [facility] CV LAB;  Service: Cardiovascular;  Laterality: N/A;  . Esophagogastroduodenoscopy  2013    Dr. Gala Romney: erosions reminiscent of GAVE but biopsies showed H.pylori gastritis  . Colonoscopy  2013    Dr. Gala Romney: multiple diminutive polyps in distal sigmoid segment, 4 mm pedunculated polyp at hepatic flexure. Tubular adenomas. Surveillance due 2018     Family History  Problem Relation Age of Onset  . Hypertension Mother   . Cancer Mother   . Cancer Father   . Diabetes Sister   FH neg for renal ds  Social History:  reports that he has been smoking Cigarettes.  He started smoking about 56 years ago. He has a 28 pack-year smoking history. He has never used smokeless tobacco. He reports that he does not drink alcohol or use illicit drugs. Lives with wife.  Truck driver  Allergies:  Allergies  Allergen Reactions  . Ace Inhibitors Cough    Medications Prior to  Admission  Medication Sig Dispense Refill  . aspirin EC 81 MG tablet Take 81 mg by mouth daily.    . cloNIDine (CATAPRES) 0.3 MG tablet Take 0.3 mg by mouth daily.    Marland Kitchen COLCRYS 0.6 MG tablet Take 2 tabs at first sign of gout and repeat in 1 hour if pain persist s 30 tablet 3  . hydrALAZINE (APRESOLINE) 100 MG tablet Take 0.5 tablets (50 mg total) by mouth 2 (two) times daily. 90 tablet 3  . Insulin Glargine (LANTUS SOLOSTAR) 100 UNIT/ML Solostar Pen Inject 30 Units into the skin daily at 10 pm. (Patient taking differently: Inject 30 Units into the skin daily. ) 5 pen 3  . metoprolol succinate (TOPROL-XL) 50 MG 24 hr tablet Take 25 mg by mouth daily.    . potassium chloride SA (K-DUR,KLOR-CON) 20 MEQ tablet Take 1.5 tablets (30 mEq total) by mouth 3 (three) times daily. 90  tablet 3  . torsemide (DEMADEX) 100 MG tablet Take 1 tablet (100 mg total) by mouth 2 (two) times daily. 180 tablet 3  . metolazone (ZAROXOLYN) 2.5 MG tablet Take 1 tablet by mouth three times a week as needed for swelling (Patient taking differently: Take 2.5 mg by mouth as needed (Swelling). ) 10 tablet 1  . ONE TOUCH ULTRA TEST test strip Check blood sugar each morning fasting and once daily 2 hours after a meal 100 each 5     Lab Results: UA: 100mg  protein otherwise benign   Recent Labs  01/05/15 0936 01/06/15 0604  WBC 5.6 5.8  HGB 9.6* 9.4*  HCT 27.8* 28.3*  PLT 175 184   BMET  Recent Labs  01/05/15 0936 01/06/15 0604 01/07/15 0640  NA 138 140 138  K 3.5 3.3* 3.5  CL 101 100* 101  CO2 25 25 22   GLUCOSE 117* 69 107*  BUN 92* 88* 85*  CREATININE 5.18* 5.40* 5.53*  CALCIUM 9.1 9.5 9.2   LFT  Recent Labs  01/07/15 0640  PROT 6.0*  ALBUMIN 3.5  AST 23  ALT 74*  ALKPHOS 97  BILITOT 0.7   Dg Cholangiogram Operative  01/07/2015  CLINICAL DATA:  gallstone pancreatitis EXAM: INTRAOPERATIVE CHOLANGIOGRAM TECHNIQUE: Cholangiographic images from the C-arm fluoroscopic device were submitted for interpretation post-operatively. Please see the procedural report for the amount of contrast and the fluoroscopy time utilized. COMPARISON:  None. FINDINGS: At least 3 filling defects noted in the distal common duct. There is a meniscus in the distal common duct. Minimal contrast passes into the duodenum. There is minimal contrast reflux into the pancreatic duct. The intrahepatic bile ducts are incompletely visualized, appearing mildly distended centrally. IMPRESSION: 1. Obstructing distal choledocholithiasis. Electronically Signed   By: Lucrezia Europe M.D.   On: 01/07/2015 12:43    ROS: No change in vision No SOB No CP No abd pain even post surgery No new arthritic CO No dysuria  PHYSICAL EXAM: Blood pressure 161/71, pulse 83, temperature 97.7 F (36.5 C), temperature source  Oral, resp. rate 13, height 5\' 9"  (1.753 m), weight 87.091 kg (192 lb), SpO2 97 %. HEENT: PERRLA EOMI NECK:No JVD LUNGS:Clear CARDIAC:RRR wo MRG ABD:+ BS NT ND soft no HSM EXT:NO CCE NEURO:CNI Ox3 no asterixis  Assessment: 1. CKD 5, relatively stable but will see how renal fx goes post surgery.  We have had multiple discussions about dialysis and preparing for it if that is what he wants to do but he has been unwilling to make any final decisions (and still remains  that way) 2. Gallstone pancreatitis  SP lap chole 3. Cardiomyopathy with EF 20% SP defibrillator 11/16 4. DM 5. HTN 6. Sec HPTH, was on calcitriol PLAN: 1. Check iron studies 2. Check PTH 3. Check PO4 4. Resume aranesp 5. Daily Scr 6. Asked if he would like to see videos of HD but he says he already knows about HD as he has gone to the outpt class.  He is still unwilling to make a decision about dialysis and simply says " I got die of something".  Note plans from Dr Oletta Lamas to do ERCP on Friday.   Daiana Vitiello T 01/07/2015, 4:27 PM

## 2015-01-07 NOTE — Transfer of Care (Signed)
Immediate Anesthesia Transfer of Care Note  Patient: [name]  Procedure(s) Performed: Procedure(s): LAPAROSCOPIC CHOLECYSTECTOMY WITH INTRAOPERATIVE CHOLANGIOGRAM (N/A) HERNIA REPAIR UMBILICAL ADULT (N/A)  Patient Location: PACU  Anesthesia Type:General  Level of Consciousness: awake  Airway & Oxygen Therapy: Patient Spontanous Breathing and Patient connected to nasal cannula oxygen  Post-op Assessment: Report given to RN  Post vital signs: Reviewed and stable  Last Vitals:  Filed Vitals:   01/07/15 0502 01/07/15 0942  BP: 161/85 171/62  Pulse: 83 90  Temp: 36.8 C 36.7 C  Resp: 18 20    Complications: No apparent anesthesia complications

## 2015-01-07 NOTE — Anesthesia Procedure Notes (Signed)
Procedure Name: Intubation Date/Time: 01/07/2015 11:49 AM Performed by: Barrington Ellison Pre-anesthesia Checklist: Patient identified, Emergency Drugs available, Suction available, Patient being monitored and Timeout performed Patient Re-evaluated:Patient Re-evaluated prior to inductionOxygen Delivery Method: Circle system utilized Preoxygenation: Pre-oxygenation with 100% oxygen Intubation Type: IV induction Ventilation: Mask ventilation without difficulty Laryngoscope Size: Mac and 3 Grade View: Grade I Tube type: Oral Tube size: 7.5 mm Number of attempts: 1 Airway Equipment and Method: Stylet Placement Confirmation: ETT inserted through vocal cords under direct vision,  positive ETCO2 and breath sounds checked- equal and bilateral Secured at: 22 cm Tube secured with: Tape Dental Injury: Teeth and Oropharynx as per pre-operative assessment

## 2015-01-07 NOTE — Op Note (Signed)
Laparoscopic Cholecystectomy with IOC Procedure Note  Indications: This patient presents with symptomatic gallbladder disease and will undergo laparoscopic cholecystectomy.  Pre-operative Diagnosis: Gallstone pancreatitis  Post-operative Diagnosis: Chronic calculus cholecystitis/ choledocholithiasis  Surgeon: Katherene Dinino K.   Assistants: None  Anesthesia: General endotracheal anesthesia  ASA Class: 2  Procedure Details  The patient was seen again in the Holding Room. The risks, benefits, complications, treatment options, and expected outcomes were discussed with the patient. The possibilities of reaction to medication, pulmonary aspiration, perforation of viscus, bleeding, recurrent infection, finding a normal gallbladder, the need for additional procedures, failure to diagnose a condition, the possible need to convert to an open procedure, and creating a complication requiring transfusion or operation were discussed with the patient. The likelihood of improving the patient's symptoms with return to their baseline status is good.  The patient and/or family concurred with the proposed plan, giving informed consent. The site of surgery properly noted. The patient was taken to Operating Room, identified as [name] and the procedure verified as Laparoscopic Cholecystectomy with Intraoperative Cholangiogram. A Time Out was held and the above information confirmed.  Prior to the induction of general anesthesia, antibiotic prophylaxis was administered. General endotracheal anesthesia was then administered and tolerated well. After the induction, the abdomen was prepped with Chloraprep and draped in the sterile fashion. The patient was positioned in the supine position.  Local anesthetic agent was injected into the skin near the umbilicus and an incision made. We dissected down to the abdominal fascia with blunt dissection.  There is a small umbilical hernia.  The hernia sac was excised with  cautery.  A pursestring suture of 0-Vicryl was placed around the fascial opening.  The Hasson cannula was inserted and secured with the stay suture.  Pneumoperitoneum was then created with CO2 and tolerated well without any adverse changes in the patient's vital signs. An 11-mm port was placed in the subxiphoid position.  Two 5-mm ports were placed in the right upper quadrant. All skin incisions were infiltrated with a local anesthetic agent before making the incision and placing the trocars.   We positioned the patient in reverse Trendelenburg, tilted slightly to the patient's left.  The gallbladder was identified, the fundus grasped and retracted cephalad. Adhesions were lysed bluntly and with the electrocautery where indicated, taking care not to injure any adjacent organs or viscus. The infundibulum was grasped and retracted laterally, exposing the peritoneum overlying the triangle of Calot. This was then divided and exposed in a blunt fashion. A critical view of the cystic duct and cystic artery was obtained.  The cystic duct was clearly identified and bluntly dissected circumferentially. The cystic duct was ligated with a clip distally.   An incision was made in the cystic duct and the [facility] cholangiogram catheter introduced. The catheter was secured using a clip. A cholangiogram was then obtained which showed good visualization of the distal and proximal biliary tree.  However, there seemed to be a couple of filling defects in the distal CBD at the ampulla.  We administered glucagon, waited three minutes, then repeated the cholangiogram.  Contrast still does not flow easily into the duodenum.  Perhaps, a tiny trickle of contrast makes it into the duodenum.  The catheter was removed.   The cystic duct was then ligated with clips and divided. The cystic artery was identified, dissected free, ligated with clips and divided as well.   The gallbladder was dissected from the liver bed in retrograde fashion  with the electrocautery.  The gallbladder was removed and placed in an Endocatch sac. The liver bed was irrigated and inspected. Hemostasis was achieved with the electrocautery and Surgicel SNOW. Copious irrigation was utilized and was repeatedly aspirated until clear.  The gallbladder and Endocatch sac were then removed through the umbilical port site.  The pursestring suture was used to close the umbilical fascia.    We again inspected the right upper quadrant for hemostasis.  Pneumoperitoneum was released as we removed the trocars.  4-0 Monocryl was used to close the skin.   Benzoin, steri-strips, and clean dressings were applied. The patient was then extubated and brought to the recovery room in stable condition. Instrument, sponge, and needle counts were correct at closure and at the conclusion of the case.   Findings: Cholecystitis with Cholelithiasis; choledocholithiasis with obstruction  Estimated Blood Loss: less than 50 mL         Drains: none         Specimens: Gallbladder           Complications: None; patient tolerated the procedure well.         Disposition: PACU - hemodynamically stable.         Condition: stable  Imogene Burn. Georgette Dover, MD, [facility]  General/ Trauma Surgery  01/07/2015 1:05 PM

## 2015-01-07 NOTE — Consult Note (Signed)
[facility] CONSULT Reason for consult: CBD stone Referring Physician: Dr. Georgette Dover. PCP: Dr. Dennard Schaumann. Primary G.I.: Dr Tamela Oddi is an 69 y.o. male.  HPI:  He was admitted to [facility] with gallstone pancreatitis. He sees Dr Gala Romney for routine G.I. care. He has a history of heart failure and ventricular arrhythmias for which he has seen Dr. Lovena Le and his had ICD placed. This apparently has never gone off. He has nonischemic cardiomyopathy and CRF with creatinine's running around 5 and is followed by Dr. Mercy Moore. He has refused dialysis so far. Due to all these problems he was transferred to [facility], hospital for laparoscopic cholecystectomy which was performed today by Dr. Georgette Dover with IOC showing several filling defects consistent with CBD stones. The patient's labs are remarkable for normal WBC and total bilirubin. His most recent lipase is normal. Were asked to see him to arrange ERCP and removal of CBD stones. He has had previous colon polyps and gastritis on previous endoscopies.  Past Medical History  Diagnosis Date  . Chronic combined systolic and diastolic CHF (congestive heart failure) ([place])     a. 02/2014 Echo: EF 20-25% (new), Gr 2 DD, mod conc LVH, mild AI/AS, mod MR, sev dil LA, mild TR.  Marland Kitchen Nonischemic cardiomyopathy ([place])     a. 02/2014 Echo: EF 20-25%;  b. 03/2014 Cath: LM nl, LAD min irregs, LCX   . Hypertension   . Chronic kidney disease (CKD), stage IV (severe) ([place]) 12/2009    a. baseline creat now ~ 4.  . Erectile dysfunction   . Colonic polyp   . Low serum testosterone level   . Tobacco abuse   . Benign prostatic hypertrophy   . Obesity, Class I, BMI 30-34.9   . Hyperlipidemia   . Adenomatous colon polyp 2006  . H. pylori infection 2013    treated with prevpac  . Mild Ao Stenosis and Insufficiency   . AICD (automatic cardioverter/defibrillator) present     STJ device, implanted 12/03/14 Dr. Lovena Le  . Sleep apnea 2010    Initially unable to  afford CPAP  . 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 Gout     Past Surgical History  Procedure Laterality Date  . Retinal laser procedure      diabetic retinopathy  . Lymph node biopsy      surgical exploration of neck-not entirely clear that this represented a lymph node biopsy  . Colonoscopy  08/23/2004    Dr. Vivi Ferns rectum, diminutive polyp of the rectosigmoid removed, inflamed focally adenomatous polyp  . Left and right heart catheterization with coronary angiogram N/A 03/19/2014    Procedure: LEFT AND RIGHT HEART CATHETERIZATION WITH CORONARY ANGIOGRAM;  Surgeon: Blane Ohara, MD;  Location: [facility] CATH LAB;  Service: Cardiovascular;  Laterality: N/A;  . Insertion of icd  12/03/2014  . Cardiac catheterization    . Ep implantable device N/A 12/03/2014    Procedure: ICD Implant;  Surgeon: Evans Lance, MD;  Location: [facility] CV LAB;  Service: Cardiovascular;  Laterality: N/A;  . Esophagogastroduodenoscopy  2013    Dr. Gala Romney: erosions reminiscent of GAVE but biopsies showed H.pylori gastritis  . Colonoscopy  2013    Dr. Gala Romney: multiple diminutive polyps in distal sigmoid segment, 4 mm pedunculated polyp at hepatic flexure. Tubular adenomas. Surveillance due 2018     Family History  Problem Relation Age of Onset  . Hypertension Mother   . Cancer Mother   .  Cancer Father   . Diabetes Sister     Social History:  reports that he has been smoking Cigarettes.  He started smoking about 56 years ago. He has a 28 pack-year smoking history. He has never used smokeless tobacco. He reports that he does not drink alcohol or use illicit drugs.  Allergies:  Allergies  Allergen Reactions  . Ace Inhibitors Cough    Medications; Prior to Admission medications   Medication Sig Start Date End Date Taking? Authorizing Provider  aspirin EC 81 MG tablet Take 81 mg by mouth daily.   Yes Historical Provider, MD  cloNIDine (CATAPRES) 0.3 MG tablet Take 0.3 mg  by mouth daily. 12/02/14  Yes Historical Provider, MD  COLCRYS 0.6 MG tablet Take 2 tabs at first sign of gout and repeat in 1 hour if pain persist s 12/02/14  Yes Susy Frizzle, MD  hydrALAZINE (APRESOLINE) 100 MG tablet Take 0.5 tablets (50 mg total) by mouth 2 (two) times daily. 01/02/15  Yes Susy Frizzle, MD  Insulin Glargine (LANTUS SOLOSTAR) 100 UNIT/ML Solostar Pen Inject 30 Units into the skin daily at 10 pm. Patient taking differently: Inject 30 Units into the skin daily.  03/28/14  Yes Susy Frizzle, MD  metoprolol succinate (TOPROL-XL) 50 MG 24 hr tablet Take 25 mg by mouth daily. 11/06/14  Yes Historical Provider, MD  potassium chloride SA (K-DUR,KLOR-CON) 20 MEQ tablet Take 1.5 tablets (30 mEq total) by mouth 3 (three) times daily. 01/02/15  Yes Susy Frizzle, MD  torsemide (DEMADEX) 100 MG tablet Take 1 tablet (100 mg total) by mouth 2 (two) times daily. 01/02/15  Yes Susy Frizzle, MD  metolazone (ZAROXOLYN) 2.5 MG tablet Take 1 tablet by mouth three times a week as needed for swelling Patient taking differently: Take 2.5 mg by mouth as needed (Swelling).  11/20/14   Susy Frizzle, MD  ONE TOUCH ULTRA TEST test strip Check blood sugar each morning fasting and once daily 2 hours after a meal 12/10/14   Susy Frizzle, MD   . cloNIDine  0.3 mg Oral Daily  . darbepoetin (ARANESP) injection - NON-DIALYSIS  100 mcg Subcutaneous Q Wed-1800  . hydrALAZINE  50 mg Oral BID  . HYDROmorphone      . HYDROmorphone      . insulin aspart  0-9 Units Subcutaneous TID WC  . insulin glargine  10 Units Subcutaneous QHS  . metoprolol succinate  25 mg Oral Daily  . torsemide  100 mg Oral BID   PRN Meds albuterol, methocarbamol, morphine injection, ondansetron **OR** ondansetron (ZOFRAN) IV, oxyCODONE, zolpidem Results for orders placed or performed during the hospital encounter of 01/03/15 (from the past 48 hour(s))  Glucose, capillary     Status: Abnormal   Collection Time:  01/05/15  5:23 PM  Result Value Ref Range   Glucose-Capillary 102 (H) 65 - 99 mg/dL   Comment 1 Notify RN   Glucose, capillary     Status: Abnormal   Collection Time: 01/05/15  9:39 PM  Result Value Ref Range   Glucose-Capillary 125 (H) 65 - 99 mg/dL   Comment 1 Notify RN    Comment 2 Document in Chart   Comprehensive metabolic panel     Status: Abnormal   Collection Time: 01/06/15  6:04 AM  Result Value Ref Range   Sodium 140 135 - 145 mmol/L   Potassium 3.3 (L) 3.5 - 5.1 mmol/L   Chloride 100 (L) 101 - 111 mmol/L  CO2 25 22 - 32 mmol/L   Glucose, Bld 69 65 - 99 mg/dL   BUN 88 (H) 6 - 20 mg/dL   Creatinine, Ser 5.40 (H) 0.61 - 1.24 mg/dL   Calcium 9.5 8.9 - 10.3 mg/dL   Total Protein 5.8 (L) 6.5 - 8.1 g/dL   Albumin 3.4 (L) 3.5 - 5.0 g/dL   AST 26 15 - 41 U/L   ALT 99 (H) 17 - 63 U/L   Alkaline Phosphatase 108 38 - 126 U/L   Total Bilirubin 0.8 0.3 - 1.2 mg/dL   GFR calc non Af Amer 10 (L) >60 mL/min   GFR calc Af Amer 11 (L) >60 mL/min    Comment: (NOTE) The eGFR has been calculated using the CKD EPI equation. This calculation has not been validated in all clinical situations. eGFR's persistently <60 mL/min signify possible Chronic Kidney Disease.    Anion gap 15 5 - 15  CBC     Status: Abnormal   Collection Time: 01/06/15  6:04 AM  Result Value Ref Range   WBC 5.8 4.0 - 10.5 K/uL   RBC 3.17 (L) 4.22 - 5.81 MIL/uL   Hemoglobin 9.4 (L) 13.0 - 17.0 g/dL   HCT 28.3 (L) 39.0 - 52.0 %   MCV 89.3 78.0 - 100.0 fL   MCH 29.7 26.0 - 34.0 pg   MCHC 33.2 30.0 - 36.0 g/dL   RDW 15.4 11.5 - 15.5 %   Platelets 184 150 - 400 K/uL  Lipase, blood     Status: None   Collection Time: 01/06/15  6:04 AM  Result Value Ref Range   Lipase 39 11 - 51 U/L  Brain natriuretic peptide     Status: Abnormal   Collection Time: 01/06/15  6:04 AM  Result Value Ref Range   B Natriuretic Peptide >4500.0 (H) 0.0 - 100.0 pg/mL  Protime-INR     Status: None   Collection Time: 01/06/15  6:04 AM   Result Value Ref Range   Prothrombin Time 14.4 11.6 - 15.2 seconds   INR 1.10 0.00 - 1.49  Glucose, capillary     Status: None   Collection Time: 01/06/15  6:36 AM  Result Value Ref Range   Glucose-Capillary 70 65 - 99 mg/dL   Comment 1 Notify RN    Comment 2 Document in Chart   Glucose, capillary     Status: Abnormal   Collection Time: 01/06/15  7:44 AM  Result Value Ref Range   Glucose-Capillary 110 (H) 65 - 99 mg/dL  Glucose, capillary     Status: Abnormal   Collection Time: 01/06/15 12:22 PM  Result Value Ref Range   Glucose-Capillary 124 (H) 65 - 99 mg/dL  Glucose, capillary     Status: Abnormal   Collection Time: 01/06/15  5:07 PM  Result Value Ref Range   Glucose-Capillary 133 (H) 65 - 99 mg/dL  Glucose, capillary     Status: Abnormal   Collection Time: 01/06/15  9:13 PM  Result Value Ref Range   Glucose-Capillary 104 (H) 65 - 99 mg/dL   Comment 1 Notify RN    Comment 2 Document in Chart   Comprehensive metabolic panel     Status: Abnormal   Collection Time: 01/07/15  6:40 AM  Result Value Ref Range   Sodium 138 135 - 145 mmol/L   Potassium 3.5 3.5 - 5.1 mmol/L   Chloride 101 101 - 111 mmol/L   CO2 22 22 - 32 mmol/L   Glucose, Bld  107 (H) 65 - 99 mg/dL   BUN 85 (H) 6 - 20 mg/dL   Creatinine, Ser 5.53 (H) 0.61 - 1.24 mg/dL   Calcium 9.2 8.9 - 10.3 mg/dL   Total Protein 6.0 (L) 6.5 - 8.1 g/dL   Albumin 3.5 3.5 - 5.0 g/dL   AST 23 15 - 41 U/L   ALT 74 (H) 17 - 63 U/L   Alkaline Phosphatase 97 38 - 126 U/L   Total Bilirubin 0.7 0.3 - 1.2 mg/dL   GFR calc non Af Amer 9 (L) >60 mL/min   GFR calc Af Amer 11 (L) >60 mL/min    Comment: (NOTE) The eGFR has been calculated using the CKD EPI equation. This calculation has not been validated in all clinical situations. eGFR's persistently <60 mL/min signify possible Chronic Kidney Disease.    Anion gap 15 5 - 15  Magnesium     Status: None   Collection Time: 01/07/15  6:40 AM  Result Value Ref Range   Magnesium 2.1  1.7 - 2.4 mg/dL  Glucose, capillary     Status: None   Collection Time: 01/07/15  7:27 AM  Result Value Ref Range   Glucose-Capillary 99 65 - 99 mg/dL  Surgical pcr screen     Status: None   Collection Time: 01/07/15  9:39 AM  Result Value Ref Range   MRSA, PCR NEGATIVE NEGATIVE   Staphylococcus aureus NEGATIVE NEGATIVE    Comment:        The Xpert SA Assay (FDA approved for NASAL specimens in patients over 96 years of age), is one component of a comprehensive surveillance program.  Test performance has been validated by [facility] for patients greater than or equal to 8 year old. It is not intended to diagnose infection nor to guide or monitor treatment.   Glucose, capillary     Status: None   Collection Time: 01/07/15  9:42 AM  Result Value Ref Range   Glucose-Capillary 99 65 - 99 mg/dL  Glucose, capillary     Status: Abnormal   Collection Time: 01/07/15  3:30 PM  Result Value Ref Range   Glucose-Capillary 130 (H) 65 - 99 mg/dL  Glucose, capillary     Status: Abnormal   Collection Time: 01/07/15  4:31 PM  Result Value Ref Range   Glucose-Capillary 120 (H) 65 - 99 mg/dL   Comment 1 Notify RN     Dg Cholangiogram Operative  01/07/2015  CLINICAL DATA:  gallstone pancreatitis EXAM: INTRAOPERATIVE CHOLANGIOGRAM TECHNIQUE: Cholangiographic images from the C-arm fluoroscopic device were submitted for interpretation post-operatively. Please see the procedural report for the amount of contrast and the fluoroscopy time utilized. COMPARISON:  None. FINDINGS: At least 3 filling defects noted in the distal common duct. There is a meniscus in the distal common duct. Minimal contrast passes into the duodenum. There is minimal contrast reflux into the pancreatic duct. The intrahepatic bile ducts are incompletely visualized, appearing mildly distended centrally. IMPRESSION: 1. Obstructing distal choledocholithiasis. Electronically Signed   By: Lucrezia Europe M.D.   On: 01/07/2015 12:43                Blood pressure 161/71, pulse 83, temperature 97.7 F (36.5 C), temperature source Oral, resp. rate 13, height '5\' 9"'$  (1.753 m), weight 87.091 kg (192 lb), SpO2 97 %.  Physical exam:   General-- alert and oriented African-American male in no distress ENT-- nonicteric Neck-- no lymphadenopathy Heart-- regular rate and rhythm without murmurs gallops Lungs-- clear Abdomen--  somewhat quiet but generally nontender    Assessment: 1. CBD stones on IOC. At least 3 small defects seen. Bilirubin liver test are normal. No signs of cholangitis. Patient will need ERCP and stone removal. 2. Diabetes type II 3. Nonischemic cardiomyopathy 4. History of ventricular tachycardia with ICD 5. Chronic renal failure has resisted dialysis so far 6. OSA  Plan: we have scheduled him for ERCP with sphincterotomy and stone extraction Friday morning. The procedure including the risk of bleeding and pancreatitis have been explained to the patient and his family. Dr. Watt Climes will be performing the procedure. We will need to place a magnet on the defibrillator during the procedure. Patient and his wife asked questions which were answered.   Roxine Whittinghill JR,Blakeley Margraf L 01/07/2015, 5:20 PM   Pager: [phone] If no answer or after hours call [phone]

## 2015-01-07 NOTE — Anesthesia Preprocedure Evaluation (Addendum)
Anesthesia Evaluation  Patient identified by MRN, date of birth, ID band Patient awake    Reviewed: Allergy & Precautions, NPO status , [name] Chart, lab work & pertinent test results, reviewed documented beta blocker date and time   Airway Mallampati: II  TM Distance: >3 FB Neck ROM: Full    Dental no notable dental hx.    Pulmonary shortness of breath, sleep apnea , COPD, Current Smoker,    Pulmonary exam normal breath sounds clear to auscultation       Cardiovascular hypertension, Pt. on medications and Pt. on home beta blockers +CHF  + Cardiac Defibrillator  Rhythm:Regular Rate:Normal + Systolic murmurs Echo [id number] - Left ventricle: The cavity size was mildly dilated. There wasmoderate concentric hypertrophy. Systolic function was severelyreduced. The estimated ejection fraction was in the range of 20%to 25%. Severe global hypokinesis is noted. Features areconsistent with a pseudonormal left ventricular filling pattern,with concomitant abnormal relaxation and increased fillingpressure (grade 2 diastolic dysfunction). Doppler parameters areconsistent with high ventricular filling pressure. - Aortic valve: Mildly calcified annulus. Trileaflet; mildly thickened leaflets. There was mild regurgitation. There is mild aortic stenosis. Peak velocity 2.6 m/s. Mean gradient (S): 11 mmHg. - Mitral valve: Mildly thickened, mildly calcified leaflets . Restricted leaflet mobility due to left ventricular dysfunction. There was moderate regurgitation. - Left atrium: The atrium was severely dilated. Volume/bsa, ES,(1-plane Simpson&'s, A2C): 43.9 ml/m^2. - Right ventricle: Systolic function was mildly reduced. - Right atrium: The atrium was mildly dilated. - Atrial septum: There was a patent foramen ovale. - Tricuspid valve: There was mild regurgitation.  Impressions: - When compare to the report dated 07/16/12, left  ventricularsystolic function has severely declined.    Neuro/Psych PSYCHIATRIC DISORDERS Depression    GI/Hepatic   Endo/Other  diabetes  Renal/GU Renal disease     Musculoskeletal   Abdominal   Peds  Hematology   Anesthesia Other Findings   Reproductive/Obstetrics                          Anesthesia Physical Anesthesia Plan  ASA: III  Anesthesia Plan: General   Post-op Pain Management:    Induction: Intravenous  Airway Management Planned: Oral ETT  Additional Equipment:   Intra-op Plan:   Post-operative Plan: Extubation in OR  Informed Consent: I have reviewed the patients History and Physical, chart, labs and discussed the procedure including the risks, benefits and alternatives for the proposed anesthesia with the patient or authorized representative who has indicated his/her understanding and acceptance.   Dental advisory given  Plan Discussed with: CRNA  Anesthesia Plan Comments:         Anesthesia Quick Evaluation

## 2015-01-07 NOTE — Anesthesia Postprocedure Evaluation (Signed)
Anesthesia Post Note  Patient: COLBURN LORRAINE  Procedure(s) Performed: Procedure(s) (LRB): LAPAROSCOPIC CHOLECYSTECTOMY WITH INTRAOPERATIVE CHOLANGIOGRAM (N/A) HERNIA REPAIR UMBILICAL ADULT (N/A)  Patient location during evaluation: PACU Anesthesia Type: General Level of consciousness: sedated and patient cooperative Pain management: pain level controlled Vital Signs Assessment: post-procedure vital signs reviewed and stable Respiratory status: spontaneous breathing Cardiovascular status: stable Anesthetic complications: no    Last Vitals:  Filed Vitals:   01/07/15 1345 01/07/15 1400  BP: 168/73 166/59  Pulse: 76 80  Temp:    Resp: 13 14    Last Pain:  Filed Vitals:   01/07/15 1412  PainSc: 10-Worst pain ever                 Nolon Nations

## 2015-01-07 NOTE — Progress Notes (Signed)
Dr. Lissa Hoard notified that patient states he is having a little trouble breathing, and that he did not have his fluid pill yesterday or today.  HOB elevated and patient repositioned.  Oxygen via Chance started at 2 liters.  Dr. Lissa Hoard stated to get an EKG and he will be by to listen to his chest.  Dr. Lissa Hoard by and listened to patient.

## 2015-01-08 ENCOUNTER — Encounter (HOSPITAL_COMMUNITY): Payer: Self-pay | Admitting: Surgery

## 2015-01-08 ENCOUNTER — Other Ambulatory Visit: Payer: Self-pay | Admitting: *Deleted

## 2015-01-08 DIAGNOSIS — K8010 Calculus of gallbladder with chronic cholecystitis without obstruction: Secondary | ICD-10-CM

## 2015-01-08 LAB — GLUCOSE, CAPILLARY
GLUCOSE-CAPILLARY: 158 mg/dL — AB (ref 65–99)
Glucose-Capillary: 133 mg/dL — ABNORMAL HIGH (ref 65–99)
Glucose-Capillary: 172 mg/dL — ABNORMAL HIGH (ref 65–99)
Glucose-Capillary: 209 mg/dL — ABNORMAL HIGH (ref 65–99)

## 2015-01-08 LAB — COMPREHENSIVE METABOLIC PANEL
ALT: 214 U/L — ABNORMAL HIGH (ref 17–63)
AST: 250 U/L — ABNORMAL HIGH (ref 15–41)
Albumin: 3.2 g/dL — ABNORMAL LOW (ref 3.5–5.0)
Alkaline Phosphatase: 181 U/L — ABNORMAL HIGH (ref 38–126)
Anion gap: 19 — ABNORMAL HIGH (ref 5–15)
BUN: 89 mg/dL — ABNORMAL HIGH (ref 6–20)
CO2: 21 mmol/L — ABNORMAL LOW (ref 22–32)
Calcium: 8.8 mg/dL — ABNORMAL LOW (ref 8.9–10.3)
Chloride: 99 mmol/L — ABNORMAL LOW (ref 101–111)
Creatinine, Ser: 5.85 mg/dL — ABNORMAL HIGH (ref 0.61–1.24)
GFR calc Af Amer: 10 mL/min — ABNORMAL LOW (ref >60)
GFR calc non Af Amer: 9 mL/min — ABNORMAL LOW (ref >60)
Glucose, Bld: 134 mg/dL — ABNORMAL HIGH (ref 65–99)
Potassium: 3.9 mmol/L (ref 3.5–5.1)
Sodium: 139 mmol/L (ref 135–145)
Total Bilirubin: 2.3 mg/dL — ABNORMAL HIGH (ref 0.3–1.2)
Total Protein: 5.7 g/dL — ABNORMAL LOW (ref 6.5–8.1)

## 2015-01-08 LAB — CBC
HCT: 29.4 % — ABNORMAL LOW (ref 39.0–52.0)
HEMOGLOBIN: 9.5 g/dL — AB (ref 13.0–17.0)
MCH: 29.7 pg (ref 26.0–34.0)
MCHC: 32.3 g/dL (ref 30.0–36.0)
MCV: 91.9 fL (ref 78.0–100.0)
Platelets: 166 10*3/uL (ref 150–400)
RBC: 3.2 MIL/uL — AB (ref 4.22–5.81)
RDW: 15.5 % (ref 11.5–15.5)
WBC: 12.3 10*3/uL — ABNORMAL HIGH (ref 4.0–10.5)

## 2015-01-08 LAB — LIPASE, BLOOD: Lipase: 49 U/L (ref 11–51)

## 2015-01-08 LAB — PHOSPHORUS: Phosphorus: 7 mg/dL — ABNORMAL HIGH (ref 2.5–4.6)

## 2015-01-08 MED ORDER — TORSEMIDE 100 MG PO TABS: 100.0000 mg | ORAL_TABLET | Freq: Two times a day (BID) | ORAL | Status: DC

## 2015-01-08 MED ORDER — POTASSIUM CHLORIDE CRYS ER 20 MEQ PO TBCR: 30.0000 meq | EXTENDED_RELEASE_TABLET | Freq: Three times a day (TID) | ORAL | Status: DC

## 2015-01-08 MED ORDER — SODIUM CHLORIDE 0.9 % IV SOLN
INTRAVENOUS | Status: DC
  Administered 2015-01-08: 22:00:00 via INTRAVENOUS

## 2015-01-08 MED ORDER — HYDRALAZINE HCL 100 MG PO TABS: 50.0000 mg | ORAL_TABLET | Freq: Two times a day (BID) | ORAL | Status: DC

## 2015-01-08 MED ORDER — METOPROLOL SUCCINATE ER 25 MG PO TB24: 25.0000 mg | ORAL_TABLET | Freq: Every day | ORAL | Status: DC

## 2015-01-08 MED ORDER — METOPROLOL SUCCINATE ER 50 MG PO TB24: 25.0000 mg | ORAL_TABLET | Freq: Every day | ORAL | Status: DC

## 2015-01-08 MED ORDER — CLONIDINE HCL 0.3 MG PO TABS: 0.3000 mg | ORAL_TABLET | Freq: Every day | ORAL | Status: DC

## 2015-01-08 MED ORDER — CALCIUM ACETATE (PHOS BINDER) 667 MG PO CAPS
667.0000 mg | ORAL_CAPSULE | Freq: Three times a day (TID) | ORAL | Status: DC
  Administered 2015-01-08 – 2015-01-10 (×6): 667 mg via ORAL
  Filled 2015-01-08 (×5): qty 1

## 2015-01-08 MED ORDER — PHENOL 1.4 % MT LIQD
1.0000 | OROMUCOSAL | Status: DC | PRN
  Filled 2015-01-08: qty 177

## 2015-01-08 MED ORDER — DEXTROSE 5 % IV SOLN
2.0000 g | INTRAVENOUS | Status: DC
  Administered 2015-01-08 – 2015-01-10 (×3): 2 g via INTRAVENOUS
  Filled 2015-01-08 (×4): qty 2

## 2015-01-08 MED ORDER — SODIUM CHLORIDE 0.9 % IV SOLN
510.0000 mg | INTRAVENOUS | Status: DC
  Administered 2015-01-08: 510 mg via INTRAVENOUS
  Filled 2015-01-08: qty 17

## 2015-01-08 NOTE — Telephone Encounter (Signed)
Received fax requesting refill on routine medications.   Refill appropriate and filled per protocol.  

## 2015-01-08 NOTE — Progress Notes (Signed)
S: Some abd pain today O:BP 167/56 mmHg  Pulse 95  Temp(Src) 98.9 F (37.2 C) (Oral)  Resp 20  Ht 5\' 9"  (1.753 m)  Wt 87.726 kg (193 lb 6.4 oz)  BMI 28.55 kg/m2  SpO2 91%  Intake/Output Summary (Last 24 hours) at 01/08/15 0856 Last data filed at 01/08/15 0630  Gross per 24 hour  Intake    360 ml  Output    850 ml  Net   -490 ml   Weight change: 0.635 kg (1 lb 6.4 oz) [id number] and alert CVS:RRR Resp:Clear Abd:+ BS SOft + tenderness over incision sites Ext: No edema NEURO: CNI Ox3 no asterixis   . cloNIDine  0.3 mg Oral Daily  . darbepoetin (ARANESP) injection - NON-DIALYSIS  100 mcg Subcutaneous Q Wed-1800  . hydrALAZINE  50 mg Oral BID  . insulin aspart  0-9 Units Subcutaneous TID WC  . insulin glargine  10 Units Subcutaneous QHS  . metoprolol succinate  25 mg Oral Daily  . torsemide  100 mg Oral BID   Dg Cholangiogram Operative  01/07/2015  CLINICAL DATA:  gallstone pancreatitis EXAM: INTRAOPERATIVE CHOLANGIOGRAM TECHNIQUE: Cholangiographic images from the C-arm fluoroscopic device were submitted for interpretation post-operatively. Please see the procedural report for the amount of contrast and the fluoroscopy time utilized. COMPARISON:  None. FINDINGS: At least 3 filling defects noted in the distal common duct. There is a meniscus in the distal common duct. Minimal contrast passes into the duodenum. There is minimal contrast reflux into the pancreatic duct. The intrahepatic bile ducts are incompletely visualized, appearing mildly distended centrally. IMPRESSION: 1. Obstructing distal choledocholithiasis. Electronically Signed   By: Lucrezia Europe M.D.   On: 01/07/2015 12:43   BMET    Component Value Date/Time   NA 139 01/08/2015 0342   K 3.9 01/08/2015 0342   CL 99* 01/08/2015 0342   CO2 21* 01/08/2015 0342   GLUCOSE 134* 01/08/2015 0342   BUN 89* 01/08/2015 0342   CREATININE 5.85* 01/08/2015 0342   CREATININE 5.14* 01/01/2015 1052   CALCIUM 8.8* 01/08/2015 0342   CALCIUM 8.9 06/10/2014 0800   GFRNONAA 9* 01/08/2015 0342   GFRNONAA 11* 09/25/2014 0926   GFRAA 10* 01/08/2015 0342   GFRAA 13* 09/25/2014 0926   CBC    Component Value Date/Time   WBC 12.3* 01/08/2015 0342   RBC 3.20* 01/08/2015 0342   HGB 9.5* 01/08/2015 0342   HCT 29.4* 01/08/2015 0342   PLT 166 01/08/2015 0342   MCV 91.9 01/08/2015 0342   MCH 29.7 01/08/2015 0342   MCHC 32.3 01/08/2015 0342   RDW 15.5 01/08/2015 0342   LYMPHSABS 0.9 01/03/2015 1707   MONOABS 0.3 01/03/2015 1707   EOSABS 0.4 01/03/2015 1707   BASOSABS 0.0 01/03/2015 1707     Assessment: 1. CKD 5 2. Gallstone pancreatitis  SP lap chole.  Still with stones in CBD and needs ERCP 3. Cardiomyopathy  EF 20% SP defib 4. DM 5. HTN 6. Sec HPTH on calcitriol, PTH P 7. Anemia  On aranesp. Iron sats low, will give IV iron  Plan: 1.  Again, he says he would do HD when needed but refuses to get access placed ahead of time 2. Note plans for ERCP tomorrow 3. Start PO4 binder 4. IV iron   Yarielys Beed T

## 2015-01-08 NOTE — Progress Notes (Signed)
Patient Name: [name] Date of Encounter: 01/08/2015     Active Problems:   Diabetes mellitus type 2 with retinopathy ([place])   Hypokalemia   HTN (hypertension)   Nonischemic cardiomyopathy ([facility])   Ventricular tachycardia ([place])   Gallstone pancreatitis   Pancreatitis   Acute biliary pancreatitis   Type II diabetes mellitus with nephropathy ([place])    SUBJECTIVE  The patient denies any chest discomfort or shortness of breath.  He underwent cholecystectomy yesterday.  He will require further surgery tomorrow for choledocholithiasis with obstruction.  Liver function studies are elevated today. Rhythm is normal sinus rhythm.  CURRENT MEDS . cloNIDine  0.3 mg Oral Daily  . darbepoetin (ARANESP) injection - NON-DIALYSIS  100 mcg Subcutaneous Q Wed-1800  . hydrALAZINE  50 mg Oral BID  . insulin aspart  0-9 Units Subcutaneous TID WC  . insulin glargine  10 Units Subcutaneous QHS  . metoprolol succinate  25 mg Oral Daily  . torsemide  100 mg Oral BID    OBJECTIVE  Filed Vitals:   01/07/15 1530 01/07/15 2019 01/08/15 0110 01/08/15 0354  BP:  167/61 156/53 167/56  Pulse: 83 92 92 95  Temp: 97.7 F (36.5 C) 98.4 F (36.9 C) 98.9 F (37.2 C) 98.9 F (37.2 C)  TempSrc:  Oral Oral Oral  Resp: 13 16 18 20   Height:      Weight:    193 lb 6.4 oz (87.726 kg)  SpO2: 97% 90% 90% 91%    Intake/Output Summary (Last 24 hours) at 01/08/15 0842 Last data filed at 01/08/15 0630  Gross per 24 hour  Intake    360 ml  Output    850 ml  Net   -490 ml   Filed Weights   01/06/15 0610 01/07/15 0502 01/08/15 0354  Weight: 193 lb (87.544 kg) 192 lb (87.091 kg) 193 lb 6.4 oz (87.726 kg)    PHYSICAL EXAM  General: Pleasant, NAD. Neuro: Alert and oriented X 3. Moves all extremities spontaneously. Psych: Normal affect. HEENT:  Normal  Neck: Supple without bruits or JVD. Lungs:  Resp regular and unlabored, CTA. Heart: RRR no s3, s4, there is a grade 2/6 systolic/diastolic murmur at  aortic area   Accessory Clinical Findings  CBC  Recent Labs  01/06/15 0604 01/08/15 0342  WBC 5.8 12.3*  HGB 9.4* 9.5*  HCT 28.3* 29.4*  MCV 89.3 91.9  PLT 184 [id number]   Basic Metabolic Panel  Recent Labs  01/07/15 0640 01/08/15 0342  NA 138 139  K 3.5 3.9  CL 101 99*  CO2 22 21*  GLUCOSE 107* 134*  BUN 85* 89*  CREATININE 5.53* 5.85*  CALCIUM 9.2 8.8*  MG 2.1  --   PHOS  --  7.0*   Liver Function Tests  Recent Labs  01/07/15 0640 01/08/15 0342  AST 23 250*  ALT 74* 214*  ALKPHOS 97 181*  BILITOT 0.7 2.3*  PROT 6.0* 5.7*  ALBUMIN 3.5 3.2*    Recent Labs  01/06/15 0604 01/08/15 0342  LIPASE 39 49   Cardiac Enzymes No results for input(s): CKTOTAL, CKMB, CKMBINDEX, TROPONINI in the last 72 hours. BNP Invalid input(s): POCBNP D-Dimer No results for input(s): DDIMER in the last 72 hours. Hemoglobin A1C No results for input(s): HGBA1C in the last 72 hours. Fasting Lipid Panel No results for input(s): CHOL, HDL, LDLCALC, TRIG, CHOLHDL, LDLDIRECT in the last 72 hours. Thyroid Function Tests No results for input(s): TSH, T4TOTAL, T3FREE, THYROIDAB in the last 72  hours.  Invalid input(s): FREET3  TELE  NSR  ECG    Radiology/Studies  Ct Abdomen Pelvis Wo Contrast  01/03/2015  CLINICAL DATA:  Right upper and right lower quadrant pain. Pain radiates to the flank and back. EXAM: CT ABDOMEN AND PELVIS WITHOUT CONTRAST TECHNIQUE: Multidetector CT imaging of the abdomen and pelvis was performed following the standard protocol without IV contrast. COMPARISON:  Renal ultrasound 05/21/2014 FINDINGS: Lower chest: Small focal parenchymal opacity in the posterior lateral right lower lobe. Minimal pleural thickening bilaterally. Punctate granuloma in the right lower lobe. Multi chamber cardiomegaly. Pacemaker wires are partially included. Liver: Scattered hypodense lesions in the liver, majority sub centimeter and too small to accurately characterize, largest in  the left lobe measures 3.7 cm. Hepatobiliary: Layering gallstones within the gallbladder. Gallbladder physiologically distended. No pericholecystic inflammation. No evidence of biliary dilatation, however common bile duct is suboptimally defined. Pancreas: No ductal dilatation or surrounding inflammation. Spleen: No focal lesion. Adrenal glands: No nodule. Kidneys: No hydronephrosis or urolithiasis. Multiple bilateral renal lesions, majority small in size and representing simple cysts. However, there is a hyperdense 9 mm lesion in the medial mid left kidney, with a complex appearing 2 cm hypodense lesion in the left mid upper pole. Stomach/Bowel: Stomach physiologically distended. There are no dilated or thickened small bowel loops. Small volume of stool throughout the colon without colonic wall thickening. The appendix is normal. Vascular/Lymphatic: No retroperitoneal adenopathy. Abdominal aorta is normal in caliber. Moderate atherosclerosis and tortuosity of the abdominal aorta without aneurysm. Reproductive: Prostate gland normal in size, central prostatic calcification. Bladder: Physiologically distended, no wall thickening. Other: No free air, free fluid, or intra-abdominal fluid collection. Small fat containing umbilical hernia. Musculoskeletal: There are no acute or suspicious osseous abnormalities. Degenerative change in the lumbar spine. IMPRESSION: 1. Small peripheral consolidation in the right lung base, this abuts the pleura and may be the cause of patient's right-sided pain. This likely represent small focal pneumonia. Chest radiograph correlation may be helpful, particularly for follow-up which is recommended in 3-4 weeks. 2. No acute intra-abdominal/pelvic abnormality. 3. Bilateral renal lesions, incompletely characterized without contrast. Majority of these are small and appear to represent cysts. Question of complex cystic lesion in the left kidney, corresponding to that seen on prior ultrasound.  Additionally there is a hyperdense subcentimeter lesion in the left kidney. Nonemergent renal protocol MRI with and with contrast is recommended for further characterization. Electronically Signed   By: Jeb Levering M.D.   On: 01/03/2015 19:16   Dg Cholangiogram Operative  01/07/2015  CLINICAL DATA:  gallstone pancreatitis EXAM: INTRAOPERATIVE CHOLANGIOGRAM TECHNIQUE: Cholangiographic images from the C-arm fluoroscopic device were submitted for interpretation post-operatively. Please see the procedural report for the amount of contrast and the fluoroscopy time utilized. COMPARISON:  None. FINDINGS: At least 3 filling defects noted in the distal common duct. There is a meniscus in the distal common duct. Minimal contrast passes into the duodenum. There is minimal contrast reflux into the pancreatic duct. The intrahepatic bile ducts are incompletely visualized, appearing mildly distended centrally. IMPRESSION: 1. Obstructing distal choledocholithiasis. Electronically Signed   By: Lucrezia Europe M.D.   On: 01/07/2015 12:43   US Abdomen Complete  01/05/2015  CLINICAL DATA:  Elevated LFTs.  Pancreatitis. EXAM: ABDOMEN ULTRASOUND COMPLETE COMPARISON:  Renal ultrasound - 05/21/2014 ; CT abdomen pelvis - 01/03/2015 FINDINGS: Gallbladder: There are several echogenic shadowing stones seen within otherwise normal-appearing gallbladder (representative image 12). Additionally, there is a approximately 0.8 x 0.6 cm echogenic structure within  the nondependent portion of the gallbladder (images 17 and 21) which may represent either an adherent stone versus a gallbladder polyp. No gallbladder wall thickening or pericholecystic fluid. Negative sonographic Murphy's sign. Common bile duct: Diameter: Normal in size measuring 5 mm in diameter Liver: There is an approximately 3.5 x 3.8 x 2.7 cm anechoic cyst within the subcapsular aspect of the left lobe of the liver. There is an additional punctate (approximately 0.7 cm) hypo  attenuating lesion in the subcapsular aspect the right lobe of the liver which is too small to accurately characterize though favored to represent additional hepatic cysts. Otherwise, normal appearance of the liver. No additional hepatic lesions identified. No intrahepatic biliary duct dilatation. No ascites. IVC: No abnormality visualized. Pancreas: Limited visualization of the pancreatic head and neck is normal. Visualization of the pancreatic body and tail is obscured by bowel gas. Spleen: Obscured by bowel gas Right Kidney: Normal in size measuring 11.5 cm in diameter. There is preserved renal cortical thickness, however there is diffuse increased renal cortical echogenicity (representative image 95), similar to renal ultrasound performed 05/2014. Grossly unchanged approximately 1.8 x 1.6 x 1.8 cm partially exophytic cyst arising from the superior pole the right kidney, unchanged since the 05/2014 examination. No evidence of right-sided urinary obstruction. No definite echogenic renal stones. Left Kidney: Normal in size measuring 11.8 cm in length. There is preserved left renal cortical thickness though there is unchanged mild diffuse increased echogenicity of the left renal parenchyma. Unchanged mild lobularity of the left renal contour. Grossly unchanged approximately 2.5 x 2.1 x 2.9 cm partially exophytic cyst arising from the superior pole the left kidney, similar to the 05/2014 examination. No echogenic renal stones. No evidence of left-sided urinary obstruction. Abdominal aorta: No aneurysm visualized. Other findings: None. IMPRESSION: 1. Cholelithiasis without evidence cholecystitis. 2. Limited visualization of the pancreatic head is normal though the pancreas is overall suboptimally evaluated due to overlying bowel gas. Clinical history of pancreatitis in the setting of gallstones does raise the possibility of gallstone pancreatitis. Clinical correlation is advised. Further evaluation with ERCP or MRCP  could be performed as indicated. 3. Suspected approximately 0.8 cm echogenic adherent stone versus polyp within otherwise normal-appearing gallbladder. If cholecystectomy is not pursued, a follow-up gallbladder ultrasound in 6-12 months is recommended to ensure stability and/or resolution. 4. Grossly unchanged bilateral renal cysts. 5. Grossly unchanged increased echogenicity of the bilateral renal parenchyma suggest medical renal disease. No evidence of urinary obstruction. Electronically Signed   By: Sandi Mariscal M.D.   On: 01/05/2015 11:41    ASSESSMENT AND PLAN 1.Nonischemic cardiomyopathy ([place]) 2.  Choledocholithiasis 3.  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  Not currently on hemodialysis.  Plan: Stable from cardiac standpoint for planned surgery tomorrow.  Signed, Warren Danes MD

## 2015-01-08 NOTE — Progress Notes (Signed)
PROGRESS NOTE  [name] [id number] DOB: 07-19-1945 DOA: 01/03/2015 PCP: Odette Fraction, MD  Brief History 70 year old male with a history of systolic and diastolic CHF, hypertension, CKD stage IV, hyperlipidemia, AICD (12/03/14), diabetes mellitus type 2 presented to the emergency department on 01/03/2015 with abdominal pain of one-day duration. He developed pain after he drinks 2 cups of coffee and ate a biscuit with sausage. Initial evaluation in the emergency department revealed AST 554, ALT 274, lipase 1757, bilirubin 1.5 and serum creatinine 5.05. Gastroenterology was consulted at [facility] who felt the patient had acute biliary pancreatitis. CT of the abdomen and pelvis revealed small peripheral RLL consolidation, left renal complex cyst, cholelithiasis without any pericholecystic inflammation or biliary ductal dilatation. 01/05/2015 abdominal ultrasound showed cholelithiasis without cholecystitis with a suspected 0.8 cm echogenic adherent stone versus polyp in the gallbladder. The patient was seen by general surgery, Dr. Arnoldo Morale, who recommended transfer to [facility] for cholecystectomy due to the patient's multiple comorbidities. Assessment/Plan: Gallstone pancreatitis/chronic calculus cholecystitis -Continue clear liquids. -abdominal pain is improved.  -LFTs have decreased significantly. -Seen by GI and surgery at [facility]-->transferred to [facility] due to medical complexity -No plan for ERCP at present. -01/05/2015 --Abdominal ultrasound- cholelithiasis without cholecystitis with a suspected 0.8 cm echogenic adherent stone versus polyp in the gallbladder -Appreciate Dr. Georgette Dover -01/07/15--laparoscopic cholecystectomy--filling defects noted in intraoperative cholangiogram -01/08/2015--ERCP planned -01/08/15--started on ceftriaxone by GI secondary to increased obesity and increased LFTs after surgery--?cholangitis  RLL Infiltrate -CT abdomen shows infiltrate of the right lung  base, however patient isn't having any signs and symptoms of pneumonia at this time and as such will not initiate antibiotics.  Chronic kidney disease stage V -Not on dialysis. -Baseline creatinine 4.8-5.1  -01/07/15-Consult nephrology--appreciate followup -continue torsemide for now as he appears hypervolemic clinically  Nonischemic cardiomyopathy  -04/05/2014 echo EF 20-25% and grade 2 diastolic dysfunction. -Patient is clinically compensated at this time -Patient is status post AICD placement. -resume home dose of torsemide 100 mg twice a day -Dry weight 190-193 pounds -Daily weights  -Continue metoprolol succinate  -Cleared for surgery by cardiology Diabetes mellitus -reduced lantus for npo status for surgery/ERCP -novolog SSI -01/03/2015 hemoglobin A1c 5.9  Hypertension -Continue metoprolol succinate, hydralazine, clonidine Hypokalemia  -Replete  -Check magnesium--2.1   Code Status: Full code  Family Communication: Wife updated at bedside  Disposition Plan: To be determined  Consultants:  Dr. Laural Golden, GI   Dr. Arnoldo Morale, surgery   Dr. Georgette Dover, surgery  Dr. Oletta Lamas GI   Procedures/Studies: Ct Abdomen Pelvis Wo Contrast  01/03/2015  CLINICAL DATA:  Right upper and right lower quadrant pain. Pain radiates to the flank and back. EXAM: CT ABDOMEN AND PELVIS WITHOUT CONTRAST TECHNIQUE: Multidetector CT imaging of the abdomen and pelvis was performed following the standard protocol without IV contrast. COMPARISON:  Renal ultrasound 05/21/2014 FINDINGS: Lower chest: Small focal parenchymal opacity in the posterior lateral right lower lobe. Minimal pleural thickening bilaterally. Punctate granuloma in the right lower lobe. Multi chamber cardiomegaly. Pacemaker wires are partially included. Liver: Scattered hypodense lesions in the liver, majority sub centimeter and too small to accurately characterize, largest in the left lobe measures 3.7 cm. Hepatobiliary: Layering  gallstones within the gallbladder. Gallbladder physiologically distended. No pericholecystic inflammation. No evidence of biliary dilatation, however common bile duct is suboptimally defined. Pancreas: No ductal dilatation or surrounding inflammation. Spleen: No focal lesion. Adrenal glands: No nodule. Kidneys: No hydronephrosis or urolithiasis. Multiple bilateral renal lesions, majority  small in size and representing simple cysts. However, there is a hyperdense 9 mm lesion in the medial mid left kidney, with a complex appearing 2 cm hypodense lesion in the left mid upper pole. Stomach/Bowel: Stomach physiologically distended. There are no dilated or thickened small bowel loops. Small volume of stool throughout the colon without colonic wall thickening. The appendix is normal. Vascular/Lymphatic: No retroperitoneal adenopathy. Abdominal aorta is normal in caliber. Moderate atherosclerosis and tortuosity of the abdominal aorta without aneurysm. Reproductive: Prostate gland normal in size, central prostatic calcification. Bladder: Physiologically distended, no wall thickening. Other: No free air, free fluid, or intra-abdominal fluid collection. Small fat containing umbilical hernia. Musculoskeletal: There are no acute or suspicious osseous abnormalities. Degenerative change in the lumbar spine. IMPRESSION: 1. Small peripheral consolidation in the right lung base, this abuts the pleura and may be the cause of patient's right-sided pain. This likely represent small focal pneumonia. Chest radiograph correlation may be helpful, particularly for follow-up which is recommended in 3-4 weeks. 2. No acute intra-abdominal/pelvic abnormality. 3. Bilateral renal lesions, incompletely characterized without contrast. Majority of these are small and appear to represent cysts. Question of complex cystic lesion in the left kidney, corresponding to that seen on prior ultrasound. Additionally there is a hyperdense subcentimeter lesion  in the left kidney. Nonemergent renal protocol MRI with and with contrast is recommended for further characterization. Electronically Signed   By: Jeb Levering M.D.   On: 01/03/2015 19:16   Dg Cholangiogram Operative  01/07/2015  CLINICAL DATA:  gallstone pancreatitis EXAM: INTRAOPERATIVE CHOLANGIOGRAM TECHNIQUE: Cholangiographic images from the C-arm fluoroscopic device were submitted for interpretation post-operatively. Please see the procedural report for the amount of contrast and the fluoroscopy time utilized. COMPARISON:  None. FINDINGS: At least 3 filling defects noted in the distal common duct. There is a meniscus in the distal common duct. Minimal contrast passes into the duodenum. There is minimal contrast reflux into the pancreatic duct. The intrahepatic bile ducts are incompletely visualized, appearing mildly distended centrally. IMPRESSION: 1. Obstructing distal choledocholithiasis. Electronically Signed   By: Lucrezia Europe M.D.   On: 01/07/2015 12:43   US Abdomen Complete  01/05/2015  CLINICAL DATA:  Elevated LFTs.  Pancreatitis. EXAM: ABDOMEN ULTRASOUND COMPLETE COMPARISON:  Renal ultrasound - 05/21/2014 ; CT abdomen pelvis - 01/03/2015 FINDINGS: Gallbladder: There are several echogenic shadowing stones seen within otherwise normal-appearing gallbladder (representative image 12). Additionally, there is a approximately 0.8 x 0.6 cm echogenic structure within the nondependent portion of the gallbladder (images 17 and 21) which may represent either an adherent stone versus a gallbladder polyp. No gallbladder wall thickening or pericholecystic fluid. Negative sonographic Murphy's sign. Common bile duct: Diameter: Normal in size measuring 5 mm in diameter Liver: There is an approximately 3.5 x 3.8 x 2.7 cm anechoic cyst within the subcapsular aspect of the left lobe of the liver. There is an additional punctate (approximately 0.7 cm) hypo attenuating lesion in the subcapsular aspect the right lobe  of the liver which is too small to accurately characterize though favored to represent additional hepatic cysts. Otherwise, normal appearance of the liver. No additional hepatic lesions identified. No intrahepatic biliary duct dilatation. No ascites. IVC: No abnormality visualized. Pancreas: Limited visualization of the pancreatic head and neck is normal. Visualization of the pancreatic body and tail is obscured by bowel gas. Spleen: Obscured by bowel gas Right Kidney: Normal in size measuring 11.5 cm in diameter. There is preserved renal cortical thickness, however there is diffuse increased renal cortical echogenicity (  representative image 95), similar to renal ultrasound performed 05/2014. Grossly unchanged approximately 1.8 x 1.6 x 1.8 cm partially exophytic cyst arising from the superior pole the right kidney, unchanged since the 05/2014 examination. No evidence of right-sided urinary obstruction. No definite echogenic renal stones. Left Kidney: Normal in size measuring 11.8 cm in length. There is preserved left renal cortical thickness though there is unchanged mild diffuse increased echogenicity of the left renal parenchyma. Unchanged mild lobularity of the left renal contour. Grossly unchanged approximately 2.5 x 2.1 x 2.9 cm partially exophytic cyst arising from the superior pole the left kidney, similar to the 05/2014 examination. No echogenic renal stones. No evidence of left-sided urinary obstruction. Abdominal aorta: No aneurysm visualized. Other findings: None. IMPRESSION: 1. Cholelithiasis without evidence cholecystitis. 2. Limited visualization of the pancreatic head is normal though the pancreas is overall suboptimally evaluated due to overlying bowel gas. Clinical history of pancreatitis in the setting of gallstones does raise the possibility of gallstone pancreatitis. Clinical correlation is advised. Further evaluation with ERCP or MRCP could be performed as indicated. 3. Suspected approximately  0.8 cm echogenic adherent stone versus polyp within otherwise normal-appearing gallbladder. If cholecystectomy is not pursued, a follow-up gallbladder ultrasound in 6-12 months is recommended to ensure stability and/or resolution. 4. Grossly unchanged bilateral renal cysts. 5. Grossly unchanged increased echogenicity of the bilateral renal parenchyma suggest medical renal disease. No evidence of urinary obstruction. Electronically Signed   By: Sandi Mariscal M.D.   On: 01/05/2015 11:41        Subjective: Patient has some minor upper abdominal pain. He is passing flatus. Denies any fevers, chills, chest pain, shortness breath, nausea, vomiting, diarrhea. Denies any headaches or neck pain.  Objective: Filed Vitals:   01/08/15 0110 01/08/15 0354 01/08/15 1227 01/08/15 1643  BP: 156/53 167/56 156/52 144/51  Pulse: 92 95 90 80  Temp: 98.9 F (37.2 C) 98.9 F (37.2 C) 98.5 F (36.9 C) 98.1 F (36.7 C)  TempSrc: Oral Oral Oral Oral  Resp: 18 20 20 20   Height:      Weight:  87.726 kg (193 lb 6.4 oz)    SpO2: 90% 91% 95% 96%    Intake/Output Summary (Last 24 hours) at 01/08/15 1819 Last data filed at 01/08/15 1331  Gross per 24 hour  Intake    840 ml  Output    850 ml  Net    -10 ml   Weight change: 0.635 kg (1 lb 6.4 oz) Exam:   General:  Pt is alert, follows commands appropriately, not in acute distress  HEENT: No icterus, No thrush, No neck mass, Waves/AT  Cardiovascular: RRR, S1/S2, no rubs, no gallops  Respiratory: Bibasilar crackles. No wheezing  Abd--soft, upper abd tender without rebound.  +BS  Extremities: 1 +LE edema, No lymphangitis, No petechiae, No rashes, no synovitis  Data Reviewed: Basic Metabolic Panel:  Recent Labs Lab 01/04/15 0622 01/05/15 0936 01/06/15 0604 01/07/15 0640 01/08/15 0342  NA 138 138 140 138 139  K 3.6 3.5 3.3* 3.5 3.9  CL 99* 101 100* 101 99*  CO2 27 25 25 22  21*  GLUCOSE 79 117* 69 107* 134*  BUN 91* 92* 88* 85* 89*  CREATININE  5.12* 5.18* 5.40* 5.53* 5.85*  CALCIUM 8.7* 9.1 9.5 9.2 8.8*  MG  --   --   --  2.1  --   PHOS  --   --   --   --  7.0*   Liver Function Tests:  Recent  Labs Lab 01/04/15 0622 01/05/15 0936 01/06/15 0604 01/07/15 0640 01/08/15 0342  AST 178* 43* 26 23 250*  ALT 215* 134* 99* 74* 214*  ALKPHOS 123 115 108 97 181*  BILITOT 0.5 0.8 0.8 0.7 2.3*  PROT 6.0* 6.2* 5.8* 6.0* 5.7*  ALBUMIN 3.3* 3.7 3.4* 3.5 3.2*    Recent Labs Lab 01/03/15 1707 01/04/15 0622 01/05/15 0936 01/06/15 0604 01/08/15 0342  LIPASE 1757* 312* 50 39 49   No results for input(s): AMMONIA in the last 168 hours. CBC:  Recent Labs Lab 01/03/15 1707 01/04/15 0622 01/05/15 0936 01/06/15 0604 01/08/15 0342  WBC 5.3 5.3 5.6 5.8 12.3*  NEUTROABS 3.7  --   --   --   --   HGB 10.2* 9.4* 9.6* 9.4* 9.5*  HCT 30.7* 28.1* 27.8* 28.3* 29.4*  MCV 89.8 89.5 88.8 89.3 91.9  PLT 188 161 175 184 166   Cardiac Enzymes: No results for input(s): CKTOTAL, CKMB, CKMBINDEX, TROPONINI in the last 168 hours. BNP: Invalid input(s): POCBNP CBG:  Recent Labs Lab 01/07/15 1631 01/07/15 2206 01/08/15 0630 01/08/15 1208 01/08/15 1650  GLUCAP 120* 110* 133* 172* 209*    Recent Results (from the past 240 hour(s))  Surgical pcr screen     Status: None   Collection Time: 01/07/15  9:39 AM  Result Value Ref Range Status   MRSA, PCR NEGATIVE NEGATIVE Final   Staphylococcus aureus NEGATIVE NEGATIVE Final    Comment:        The Xpert SA Assay (FDA approved for NASAL specimens in patients over 27 years of age), is one component of a comprehensive surveillance program.  Test performance has been validated by [facility] for patients greater than or equal to 70 year old. It is not intended to diagnose infection nor to guide or monitor treatment.      Scheduled Meds: . calcium acetate  667 mg Oral TID WC  . cefTRIAXone (ROCEPHIN)  IV  2 g Intravenous Q24H  . cloNIDine  0.3 mg Oral Daily  . darbepoetin  (ARANESP) injection - NON-DIALYSIS  100 mcg Subcutaneous Q Wed-1800  . ferumoxytol  510 mg Intravenous Weekly  . hydrALAZINE  50 mg Oral BID  . insulin aspart  0-9 Units Subcutaneous TID WC  . insulin glargine  10 Units Subcutaneous QHS  . metoprolol succinate  25 mg Oral Daily  . torsemide  100 mg Oral BID   Continuous Infusions: . sodium chloride       Darnel Mchan, DO  [facility] Pager [phone]  If 7PM-7AM, please contact night-coverage [email or web address] Password [facility] 01/08/2015, 6:19 PM   LOS: 5 days

## 2015-01-08 NOTE — Progress Notes (Signed)
[facility] PROGRESS NOTE Subjective patient has some postoperative soreness. He tolerated diet without problem  Objective: Vital signs in last 24 hours: Temp:  [97.7 F (36.5 C)-98.9 F (37.2 C)] 98.9 F (37.2 C) (12/22 0354) Pulse Rate:  [76-95] 95 (12/22 0354) Resp:  [11-20] 20 (12/22 0354) BP: (156-171)/(53-73) 167/56 mmHg (12/22 0354) SpO2:  [90 %-99 %] 91 % (12/22 0354) Weight:  [87.726 kg (193 lb 6.4 oz)] 87.726 kg (193 lb 6.4 oz) (12/22 0354) Last BM Date: 01/05/15  Intake/Output from previous day: 12/21 0701 - 12/22 0700 In: 360 [P.O.:360] Out: 850 [Urine:850] Intake/Output this shift: Total I/O In: 240 [P.O.:240] Out: -   PE: General-- alert no distress  Abdomen-- soft minimal tender  Lab Results:  Recent Labs  01/06/15 0604 01/08/15 0342  WBC 5.8 12.3*  HGB 9.4* 9.5*  HCT 28.3* 29.4*  PLT 184 166   BMET  Recent Labs  01/06/15 0604 01/07/15 0640 01/08/15 0342  NA 140 138 139  K 3.3* 3.5 3.9  CL 100* 101 99*  CO2 25 22 21*  CREATININE 5.40* 5.53* 5.85*   LFT  Recent Labs  01/06/15 0604 01/07/15 0640 01/08/15 0342  PROT 5.8* 6.0* 5.7*  AST 26 23 250*  ALT 99* 74* 214*  ALKPHOS 108 97 181*  BILITOT 0.8 0.7 2.3*   PT/INR  Recent Labs  01/06/15 0604  LABPROT 14.4  INR 1.10   PANCREAS  Recent Labs  01/06/15 0604 01/08/15 0342  LIPASE 39 49         Studies/Results: Dg Cholangiogram Operative  01/07/2015  CLINICAL DATA:  gallstone pancreatitis EXAM: INTRAOPERATIVE CHOLANGIOGRAM TECHNIQUE: Cholangiographic images from the C-arm fluoroscopic device were submitted for interpretation post-operatively. Please see the procedural report for the amount of contrast and the fluoroscopy time utilized. COMPARISON:  None. FINDINGS: At least 3 filling defects noted in the distal common duct. There is a meniscus in the distal common duct. Minimal contrast passes into the duodenum. There is minimal contrast reflux into the  pancreatic duct. The intrahepatic bile ducts are incompletely visualized, appearing mildly distended centrally. IMPRESSION: 1. Obstructing distal choledocholithiasis. Electronically Signed   By: Lucrezia Europe M.D.   On: 01/07/2015 12:43    Medications: I have reviewed the patient's current medications.  Assessment/Plan: 1. CBD stones on IOC. Patient is scheduled for ERCP tomorrow morning at 9 o'clock by Dr. Watt Climes. This procedure has been discussed in detail with the patient and his wife. Concerning that he has slightly bumped his liver test and slightly bumped his WBC and for this reason we will go ahead and place them on Rocephin.   Tinisha Etzkorn JR,Cyleigh Massaro L 01/08/2015, 11:03 AM  Pager: [phone] If no answer or after hours call [phone]

## 2015-01-08 NOTE — Progress Notes (Signed)
1 Day Post-Op  Subjective: ERCP scheduled for Friday morning Sore at surgical sites - no nausea  Objective: Vital signs in last 24 hours: Temp:  [97.7 F (36.5 C)-98.9 F (37.2 C)] 98.9 F (37.2 C) (12/22 0354) Pulse Rate:  [76-95] 95 (12/22 0354) Resp:  [11-20] 20 (12/22 0354) BP: (156-171)/(53-73) 167/56 mmHg (12/22 0354) SpO2:  [90 %-99 %] 91 % (12/22 0354) Weight:  [87.726 kg (193 lb 6.4 oz)] 87.726 kg (193 lb 6.4 oz) (12/22 0354) Last BM Date: 01/05/15  Intake/Output from previous day: 12/21 0701 - 12/22 0700 In: 360 [P.O.:360] Out: 850 [Urine:850] Intake/Output this shift:    General appearance: alert, cooperative and no distress GI: soft, tender around incisions Dressings c/d/i  Lab Results:   Recent Labs  01/06/15 0604 01/08/15 0342  WBC 5.8 12.3*  HGB 9.4* 9.5*  HCT 28.3* 29.4*  PLT 184 166   BMET  Recent Labs  01/07/15 0640 01/08/15 0342  NA 138 139  K 3.5 3.9  CL 101 99*  CO2 22 21*  GLUCOSE 107* 134*  BUN 85* 89*  CREATININE 5.53* 5.85*  CALCIUM 9.2 8.8*   PT/INR  Recent Labs  01/06/15 0604  LABPROT 14.4  INR 1.10   ABG No results for input(s): PHART, HCO3 in the last 72 hours.  Invalid input(s): PCO2, PO2  Studies/Results: Dg Cholangiogram Operative  01/07/2015  CLINICAL DATA:  gallstone pancreatitis EXAM: INTRAOPERATIVE CHOLANGIOGRAM TECHNIQUE: Cholangiographic images from the C-arm fluoroscopic device were submitted for interpretation post-operatively. Please see the procedural report for the amount of contrast and the fluoroscopy time utilized. COMPARISON:  None. FINDINGS: At least 3 filling defects noted in the distal common duct. There is a meniscus in the distal common duct. Minimal contrast passes into the duodenum. There is minimal contrast reflux into the pancreatic duct. The intrahepatic bile ducts are incompletely visualized, appearing mildly distended centrally. IMPRESSION: 1. Obstructing distal choledocholithiasis.  Electronically Signed   By: Lucrezia Europe M.D.   On: 01/07/2015 12:43    Anti-infectives: Anti-infectives    Start     Dose/Rate Route Frequency Ordered Stop   01/07/15 0800  cefTRIAXone (ROCEPHIN) 2 g in dextrose 5 % 50 mL IVPB     2 g 100 mL/hr over 30 Minutes Intravenous To [facility] 01/07/15 0710 01/07/15 1225   01/06/15 1000  cefTRIAXone (ROCEPHIN) 2 g in dextrose 5 % 50 mL IVPB     2 g 100 mL/hr over 30 Minutes Intravenous To [facility] 01/07/15 1555 01/07/15 1000      Assessment/Plan: s/p Procedure(s): LAPAROSCOPIC CHOLECYSTECTOMY WITH INTRAOPERATIVE CHOLANGIOGRAM (N/A) HERNIA REPAIR UMBILICAL ADULT (N/A) Elevated LFT's - T bili 2.3  ERCP tomorrow by Dr. Watt Climes May remove dressings tomorrow and shower over steri-strips After ERCP, advance diet as tolerated Discharge instructions and follow-up arranged. Will sign off for now.  Call us with any questions.   LOS: 5 days    Richards Pherigo K. 01/08/2015

## 2015-01-09 ENCOUNTER — Observation Stay (HOSPITAL_COMMUNITY): Payer: Medicare Other

## 2015-01-09 ENCOUNTER — Encounter (HOSPITAL_COMMUNITY)
Admission: EM | Disposition: A | Discharge status: Home / Self Care | Payer: Self-pay | Source: Home / Self Care | Attending: Internal Medicine

## 2015-01-09 ENCOUNTER — Observation Stay (HOSPITAL_COMMUNITY): Payer: Medicare Other | Admitting: Anesthesiology

## 2015-01-09 ENCOUNTER — Encounter (HOSPITAL_COMMUNITY): Payer: Self-pay | Admitting: Anesthesiology

## 2015-01-09 DIAGNOSIS — I429 Cardiomyopathy, unspecified: Secondary | ICD-10-CM | POA: Diagnosis not present

## 2015-01-09 DIAGNOSIS — R1084 Generalized abdominal pain: Secondary | ICD-10-CM | POA: Diagnosis not present

## 2015-01-09 DIAGNOSIS — K8031 Calculus of bile duct with cholangitis, unspecified, with obstruction: Secondary | ICD-10-CM | POA: Diagnosis not present

## 2015-01-09 DIAGNOSIS — K8010 Calculus of gallbladder with chronic cholecystitis without obstruction: Secondary | ICD-10-CM | POA: Diagnosis not present

## 2015-01-09 DIAGNOSIS — N184 Chronic kidney disease, stage 4 (severe): Secondary | ICD-10-CM | POA: Diagnosis not present

## 2015-01-09 DIAGNOSIS — Z6830 Body mass index (BMI) 30.0-30.9, adult: Secondary | ICD-10-CM | POA: Diagnosis not present

## 2015-01-09 DIAGNOSIS — K5900 Constipation, unspecified: Secondary | ICD-10-CM | POA: Diagnosis not present

## 2015-01-09 DIAGNOSIS — I447 Left bundle-branch block, unspecified: Secondary | ICD-10-CM | POA: Diagnosis not present

## 2015-01-09 DIAGNOSIS — I428 Other cardiomyopathies: Secondary | ICD-10-CM | POA: Diagnosis not present

## 2015-01-09 DIAGNOSIS — M109 Gout, unspecified: Secondary | ICD-10-CM | POA: Diagnosis not present

## 2015-01-09 DIAGNOSIS — N179 Acute kidney failure, unspecified: Secondary | ICD-10-CM | POA: Diagnosis not present

## 2015-01-09 DIAGNOSIS — E785 Hyperlipidemia, unspecified: Secondary | ICD-10-CM | POA: Diagnosis not present

## 2015-01-09 DIAGNOSIS — R1011 Right upper quadrant pain: Secondary | ICD-10-CM | POA: Diagnosis present

## 2015-01-09 DIAGNOSIS — K851 Biliary acute pancreatitis without necrosis or infection: Secondary | ICD-10-CM | POA: Diagnosis not present

## 2015-01-09 DIAGNOSIS — N2581 Secondary hyperparathyroidism of renal origin: Secondary | ICD-10-CM | POA: Diagnosis not present

## 2015-01-09 DIAGNOSIS — E1122 Type 2 diabetes mellitus with diabetic chronic kidney disease: Secondary | ICD-10-CM | POA: Diagnosis not present

## 2015-01-09 DIAGNOSIS — K429 Umbilical hernia without obstruction or gangrene: Secondary | ICD-10-CM | POA: Diagnosis not present

## 2015-01-09 DIAGNOSIS — K830 Cholangitis: Secondary | ICD-10-CM | POA: Diagnosis not present

## 2015-01-09 DIAGNOSIS — N400 Enlarged prostate without lower urinary tract symptoms: Secondary | ICD-10-CM | POA: Diagnosis not present

## 2015-01-09 DIAGNOSIS — I5042 Chronic combined systolic (congestive) and diastolic (congestive) heart failure: Secondary | ICD-10-CM | POA: Diagnosis not present

## 2015-01-09 DIAGNOSIS — N185 Chronic kidney disease, stage 5: Secondary | ICD-10-CM | POA: Diagnosis not present

## 2015-01-09 DIAGNOSIS — I083 Combined rheumatic disorders of mitral, aortic and tricuspid valves: Secondary | ICD-10-CM | POA: Diagnosis not present

## 2015-01-09 DIAGNOSIS — Z01810 Encounter for preprocedural cardiovascular examination: Secondary | ICD-10-CM | POA: Diagnosis not present

## 2015-01-09 DIAGNOSIS — E860 Dehydration: Secondary | ICD-10-CM | POA: Diagnosis not present

## 2015-01-09 DIAGNOSIS — K8065 Calculus of gallbladder and bile duct with chronic cholecystitis with obstruction: Secondary | ICD-10-CM | POA: Diagnosis not present

## 2015-01-09 DIAGNOSIS — I130 Hypertensive heart and chronic kidney disease with heart failure and stage 1 through stage 4 chronic kidney disease, or unspecified chronic kidney disease: Secondary | ICD-10-CM | POA: Diagnosis not present

## 2015-01-09 DIAGNOSIS — E11319 Type 2 diabetes mellitus with unspecified diabetic retinopathy without macular edema: Secondary | ICD-10-CM | POA: Diagnosis not present

## 2015-01-09 DIAGNOSIS — K8050 Calculus of bile duct without cholangitis or cholecystitis without obstruction: Secondary | ICD-10-CM

## 2015-01-09 DIAGNOSIS — D638 Anemia in other chronic diseases classified elsewhere: Secondary | ICD-10-CM | POA: Diagnosis not present

## 2015-01-09 DIAGNOSIS — J449 Chronic obstructive pulmonary disease, unspecified: Secondary | ICD-10-CM | POA: Diagnosis not present

## 2015-01-09 DIAGNOSIS — E11311 Type 2 diabetes mellitus with unspecified diabetic retinopathy with macular edema: Secondary | ICD-10-CM | POA: Diagnosis not present

## 2015-01-09 DIAGNOSIS — I10 Essential (primary) hypertension: Secondary | ICD-10-CM | POA: Diagnosis not present

## 2015-01-09 DIAGNOSIS — E11649 Type 2 diabetes mellitus with hypoglycemia without coma: Secondary | ICD-10-CM | POA: Diagnosis not present

## 2015-01-09 DIAGNOSIS — E876 Hypokalemia: Secondary | ICD-10-CM | POA: Diagnosis not present

## 2015-01-09 DIAGNOSIS — E1129 Type 2 diabetes mellitus with other diabetic kidney complication: Secondary | ICD-10-CM | POA: Diagnosis not present

## 2015-01-09 DIAGNOSIS — E669 Obesity, unspecified: Secondary | ICD-10-CM | POA: Diagnosis not present

## 2015-01-09 DIAGNOSIS — I878 Other specified disorders of veins: Secondary | ICD-10-CM | POA: Diagnosis not present

## 2015-01-09 HISTORY — PX: ERCP: SHX5425

## 2015-01-09 LAB — PARATHYROID HORMONE, INTACT (NO CA): PTH: 372 pg/mL — ABNORMAL HIGH (ref 15–65)

## 2015-01-09 LAB — GLUCOSE, CAPILLARY
GLUCOSE-CAPILLARY: 154 mg/dL — AB (ref 65–99)
Glucose-Capillary: 189 mg/dL — ABNORMAL HIGH (ref 65–99)
Glucose-Capillary: 137 mg/dL — ABNORMAL HIGH (ref 65–99)
Glucose-Capillary: 152 mg/dL — ABNORMAL HIGH (ref 65–99)

## 2015-01-09 LAB — CBC WITH DIFFERENTIAL/PLATELET
Basophils Absolute: 0 10*3/uL (ref 0.0–0.1)
Basophils Relative: 0 %
EOS ABS: 0.4 10*3/uL (ref 0.0–0.7)
EOS PCT: 4 %
HCT: 27.5 % — ABNORMAL LOW (ref 39.0–52.0)
HEMOGLOBIN: 9.4 g/dL — AB (ref 13.0–17.0)
LYMPHS ABS: 0.9 10*3/uL (ref 0.7–4.0)
LYMPHS PCT: 9 %
MCH: 30.2 pg (ref 26.0–34.0)
MCHC: 34.2 g/dL (ref 30.0–36.0)
MCV: 88.4 fL (ref 78.0–100.0)
MONOS PCT: 5 %
Monocytes Absolute: 0.5 10*3/uL (ref 0.1–1.0)
Neutro Abs: 8.9 10*3/uL — ABNORMAL HIGH (ref 1.7–7.7)
Neutrophils Relative %: 82 %
PLATELETS: 144 10*3/uL — AB (ref 150–400)
RBC: 3.11 MIL/uL — ABNORMAL LOW (ref 4.22–5.81)
RDW: 15.4 % (ref 11.5–15.5)
WBC: 10.8 10*3/uL — ABNORMAL HIGH (ref 4.0–10.5)

## 2015-01-09 LAB — COMPREHENSIVE METABOLIC PANEL
ALK PHOS: 177 U/L — AB (ref 38–126)
ALT: 177 U/L — AB (ref 17–63)
ANION GAP: 14 (ref 5–15)
AST: 95 U/L — ABNORMAL HIGH (ref 15–41)
Albumin: 2.8 g/dL — ABNORMAL LOW (ref 3.5–5.0)
BILIRUBIN TOTAL: 2 mg/dL — AB (ref 0.3–1.2)
BUN: 97 mg/dL — ABNORMAL HIGH (ref 6–20)
CO2: 23 mmol/L (ref 22–32)
CREATININE: 6.71 mg/dL — AB (ref 0.61–1.24)
Calcium: 9 mg/dL (ref 8.9–10.3)
Chloride: 99 mmol/L — ABNORMAL LOW (ref 101–111)
GFR, EST AFRICAN AMERICAN: 9 mL/min — AB (ref >60)
GFR, EST NON AFRICAN AMERICAN: 7 mL/min — AB (ref >60)
Glucose, Bld: 143 mg/dL — ABNORMAL HIGH (ref 65–99)
Potassium: 4.1 mmol/L (ref 3.5–5.1)
SODIUM: 136 mmol/L (ref 135–145)
TOTAL PROTEIN: 5.5 g/dL — AB (ref 6.5–8.1)

## 2015-01-09 SURGERY — ERCP, WITH INTERVENTION IF INDICATED
Anesthesia: Monitor Anesthesia Care

## 2015-01-09 SURGERY — ERCP, WITH INTERVENTION IF INDICATED
Anesthesia: General

## 2015-01-09 MED ORDER — GLUCAGON HCL RDNA (DIAGNOSTIC) 1 MG IJ SOLR
INTRAMUSCULAR | Status: DC | PRN
  Administered 2015-01-09: .5 mg via INTRAVENOUS

## 2015-01-09 MED ORDER — SODIUM CHLORIDE 0.9 % IV SOLN
INTRAVENOUS | Status: DC | PRN
  Administered 2015-01-09: 40 mL

## 2015-01-09 MED ORDER — GLUCAGON HCL RDNA (DIAGNOSTIC) 1 MG IJ SOLR
INTRAMUSCULAR | Status: AC
  Filled 2015-01-09: qty 2

## 2015-01-09 MED ORDER — ETOMIDATE 2 MG/ML IV SOLN
INTRAVENOUS | Status: DC | PRN
  Administered 2015-01-09: 4 mg via INTRAVENOUS
  Administered 2015-01-09: 14 mg via INTRAVENOUS

## 2015-01-09 MED ORDER — LACTATED RINGERS IV SOLN
INTRAVENOUS | Status: DC
  Administered 2015-01-09 (×2): via INTRAVENOUS

## 2015-01-09 MED ORDER — CALCITRIOL 0.25 MCG PO CAPS
0.2500 ug | ORAL_CAPSULE | Freq: Every day | ORAL | Status: DC
Start: 2015-01-09 — End: 2015-01-11
  Administered 2015-01-09 – 2015-01-10 (×2): 0.25 ug via ORAL
  Filled 2015-01-09 (×2): qty 1

## 2015-01-09 MED ORDER — FENTANYL CITRATE (PF) 100 MCG/2ML IJ SOLN
INTRAMUSCULAR | Status: DC | PRN
  Administered 2015-01-09: 100 ug via INTRAVENOUS

## 2015-01-09 MED ORDER — LIDOCAINE HCL 4 % EX SOLN
CUTANEOUS | Status: DC | PRN
  Administered 2015-01-09: 3 mL via TOPICAL

## 2015-01-09 MED ORDER — SUCCINYLCHOLINE CHLORIDE 20 MG/ML IJ SOLN
INTRAMUSCULAR | Status: DC | PRN
  Administered 2015-01-09: 50 mg via INTRAVENOUS

## 2015-01-09 MED ORDER — LIDOCAINE HCL (CARDIAC) 20 MG/ML IV SOLN
INTRAVENOUS | Status: DC | PRN
  Administered 2015-01-09: 60 mg via INTRAVENOUS

## 2015-01-09 MED ORDER — HYDRALAZINE HCL 50 MG PO TABS
100.0000 mg | ORAL_TABLET | Freq: Two times a day (BID) | ORAL | Status: DC
  Administered 2015-01-09 – 2015-01-10 (×3): 100 mg via ORAL
  Filled 2015-01-09 (×3): qty 2

## 2015-01-09 NOTE — Progress Notes (Signed)
Subjective:  S/p ERCP- results pending- UOP low and creatinine worse- "I feel with my hands" not uremic- feels that worsening kidney function is due to disruption of routine Objective Vital signs in last 24 hours: Filed Vitals:   01/09/15 1110 01/09/15 1120 01/09/15 1130 01/09/15 1142  BP: 174/77 178/60 168/94 166/59  Pulse: 82 82 84 90  Temp: 97.7 F (36.5 C)   98.3 F (36.8 C)  TempSrc: Oral   Oral  Resp: 19 25 17 18   Height:      Weight:      SpO2: 99% 100% 96% 91%   Weight change: 1.225 kg (2 lb 11.2 oz)  Intake/Output Summary (Last 24 hours) at 01/09/15 1415 Last data filed at 01/09/15 1114  Gross per 24 hour  Intake   1263 ml  Output    300 ml  Net    963 ml    Assessment/ Plan: Pt is a 69 y.o. yo male who was admitted on 01/03/2015 with gallstone pancreatitis, CM and worsening renal function with stage 5 at baseline  Assessment/Plan: 1. Renal- stage 5 at baseline- now worsening in the setting of above.  Is not uremic or at least does not admit to it and does not feel that he will need HD- He is one that I think is going to have to get sick before he will accept - he is also making noises about leaving AMA 2. Vol/HTN- BP high - on clonidine daily, hydralazine 50 BID, toprol 25- max torsemide- will inc hydralazine 3. Anemia- IV iron ordered 4. Secondary hyperparathyroidism- on phoslo- PTH 372- will also add calcitriol  But need to watch for hypercalcemia   Diamond Martucci A    Labs: Basic Metabolic Panel:  Recent Labs Lab 01/07/15 0640 01/08/15 0342 01/09/15 0419  NA 138 139 136  K 3.5 3.9 4.1  CL 101 99* 99*  CO2 22 21* 23  GLUCOSE 107* 134* 143*  BUN 85* 89* 97*  CREATININE 5.53* 5.85* 6.71*  CALCIUM 9.2 8.8* 9.0  PHOS  --  7.0*  --    Liver Function Tests:  Recent Labs Lab 01/07/15 0640 01/08/15 0342 01/09/15 0419  AST 23 250* 95*  ALT 74* 214* 177*  ALKPHOS 97 181* 177*  BILITOT 0.7 2.3* 2.0*  PROT 6.0* 5.7* 5.5*  ALBUMIN 3.5 3.2* 2.8*     Recent Labs Lab 01/05/15 0936 01/06/15 0604 01/08/15 0342  LIPASE 50 39 49   No results for input(s): AMMONIA in the last 168 hours. CBC:  Recent Labs Lab 01/03/15 1707 01/04/15 0622 01/05/15 0936 01/06/15 0604 01/08/15 0342 01/09/15 0419  WBC 5.3 5.3 5.6 5.8 12.3* 10.8*  NEUTROABS 3.7  --   --   --   --  8.9*  HGB 10.2* 9.4* 9.6* 9.4* 9.5* 9.4*  HCT 30.7* 28.1* 27.8* 28.3* 29.4* 27.5*  MCV 89.8 89.5 88.8 89.3 91.9 88.4  PLT 188 161 175 184 166 144*   Cardiac Enzymes: No results for input(s): CKTOTAL, CKMB, CKMBINDEX, TROPONINI in the last 168 hours. CBG:  Recent Labs Lab 01/08/15 1208 01/08/15 1650 01/08/15 2124 01/09/15 0629 01/09/15 1141  GLUCAP 172* 209* 158* 154* 189*    Iron Studies:  Recent Labs  01/07/15 1836  IRON 42*  TIBC 237*  FERRITIN 1449*   Studies/Results: Dg Ercp Biliary & Pancreatic Ducts  01/09/2015  CLINICAL DATA:  69 year old male with a history of choledocholithiasis. EXAM: ERCP TECHNIQUE: Multiple spot images obtained with the fluoroscopic device and submitted for interpretation post-procedure.  FLUOROSCOPY TIME:  Fluoroscopy Time:  6 minutes 41 seconds COMPARISON:  CT 01/03/2015, 01/05/2015, intraoperative cholangiogram 01/07/2015 FINDINGS: Endoscope projects over the upper abdomen. Initial image demonstrates cannulation of the common bile duct/ampulla. Partial opacification of the extrahepatic biliary system with small filling defects within the ductal system. Final image demonstrates resolution of filling defects. IMPRESSION: Limited images during ERCP demonstrates cannulation of the ampulla/ ductal system, and resolution of small filling defects within the extrahepatic ductal system during the course of the exam. Please refer to the dictated operative report for full details of intraoperative findings and procedure. Signed, Dulcy Fanny. Earleen Newport, DO [facility] Radiology Electronically Signed    By: Corrie Mckusick D.O.   On: 01/09/2015 11:38   Medications: Infusions:    Scheduled Medications: . calcium acetate  667 mg Oral TID WC  . cefTRIAXone (ROCEPHIN)  IV  2 g Intravenous Q24H  . cloNIDine  0.3 mg Oral Daily  . darbepoetin (ARANESP) injection - NON-DIALYSIS  100 mcg Subcutaneous Q Wed-1800  . ferumoxytol  510 mg Intravenous Weekly  . hydrALAZINE  50 mg Oral BID  . insulin aspart  0-9 Units Subcutaneous TID WC  . insulin glargine  10 Units Subcutaneous QHS  . metoprolol succinate  25 mg Oral Daily  . torsemide  100 mg Oral BID    have reviewed scheduled and prn medications.  Physical Exam: General: NAD Heart: RRR Lungs: mostly clear Abdomen: tender Extremities: trace edema   01/09/2015,2:15 PM  LOS: 6 days

## 2015-01-09 NOTE — Anesthesia Preprocedure Evaluation (Signed)
Anesthesia Evaluation  Patient identified by MRN, date of birth, ID band Patient awake    Reviewed: Allergy & Precautions, NPO status , [name] Chart, lab work & pertinent test results, reviewed documented beta blocker date and time   History of Anesthesia Complications Negative for: history of anesthetic complications  Airway Mallampati: II  TM Distance: >3 FB Neck ROM: Full    Dental no notable dental hx. (+) Teeth Intact   Pulmonary shortness of breath, sleep apnea , COPD, Current Smoker,    Pulmonary exam normal breath sounds clear to auscultation       Cardiovascular hypertension, Pt. on medications and Pt. on home beta blockers +CHF  + Cardiac Defibrillator  Rhythm:Regular Rate:Normal + Systolic murmurs Echo [id number] - Left ventricle: The cavity size was mildly dilated. There wasmoderate concentric hypertrophy. Systolic function was severelyreduced. The estimated ejection fraction was in the range of 20%to 25%. Severe global hypokinesis is noted. Features areconsistent with a pseudonormal left ventricular filling pattern,with concomitant abnormal relaxation and increased fillingpressure (grade 2 diastolic dysfunction). Doppler parameters areconsistent with high ventricular filling pressure. - Aortic valve: Mildly calcified annulus. Trileaflet; mildly thickened leaflets. There was mild regurgitation. There is mild aortic stenosis. Peak velocity 2.6 m/s. Mean gradient (S): 11 mmHg. - Mitral valve: Mildly thickened, mildly calcified leaflets . Restricted leaflet mobility due to left ventricular dysfunction. There was moderate regurgitation. - Left atrium: The atrium was severely dilated. Volume/bsa, ES,(1-plane Simpson&'s, A2C): 43.9 ml/m^2. - Right ventricle: Systolic function was mildly reduced. - Right atrium: The atrium was mildly dilated. - Atrial septum: There was a patent foramen ovale. - Tricuspid  valve: There was mild regurgitation.  Impressions: - When compare to the report dated 07/16/12, left ventricularsystolic function has severely declined.    Neuro/Psych PSYCHIATRIC DISORDERS Depression    GI/Hepatic   Endo/Other  diabetes  Renal/GU Renal disease     Musculoskeletal   Abdominal   Peds  Hematology   Anesthesia Other Findings   Reproductive/Obstetrics                             Anesthesia Physical Anesthesia Plan  ASA: III  Anesthesia Plan: General   Post-op Pain Management:    Induction: Intravenous  Airway Management Planned: Oral ETT  Additional Equipment: None  Intra-op Plan:   Post-operative Plan: Extubation in OR  Informed Consent: I have reviewed the patients History and Physical, chart, labs and discussed the procedure including the risks, benefits and alternatives for the proposed anesthesia with the patient or authorized representative who has indicated his/her understanding and acceptance.   Dental advisory given  Plan Discussed with: CRNA and Surgeon  Anesthesia Plan Comments:         Anesthesia Quick Evaluation

## 2015-01-09 NOTE — Progress Notes (Signed)
PROGRESS NOTE  [name] [id number] DOB: 30-Apr-1945 DOA: 01/03/2015 PCP: Odette Fraction, MD  Brief History 69 year old male with a history of systolic and diastolic CHF, hypertension, CKD stage IV, hyperlipidemia, AICD (12/03/14), diabetes mellitus type 2 presented to the emergency department on 01/03/2015 with abdominal pain of one-day duration. He developed pain after he drinks 2 cups of coffee and ate a biscuit with sausage. Initial evaluation in the emergency department revealed AST 554, ALT 274, lipase 1757, bilirubin 1.5 and serum creatinine 5.05. Gastroenterology was consulted at [facility] who felt the patient had acute biliary pancreatitis. CT of the abdomen and pelvis revealed small peripheral RLL consolidation, left renal complex cyst, cholelithiasis without any pericholecystic inflammation or biliary ductal dilatation. 01/05/2015 abdominal ultrasound showed cholelithiasis without cholecystitis with a suspected 0.8 cm echogenic adherent stone versus polyp in the gallbladder. The patient was seen by general surgery, Dr. Arnoldo Morale, who recommended transfer to [facility] for cholecystectomy due to the patient's multiple comorbidities. After the transfer, the patient underwent a laparoscopic cholecystectomy on 01/07/2015. Intraoperative cholangiogram showed filling defects. As a result, the patient underwent ERCP on 01/09/2015. Unfortunately, his renal function continues to worsen. Nephrology was consulted to assist. Assessment/Plan: Gallstone pancreatitis/chronic calculus cholecystitis -Continue clear liquids. -abdominal pain is improved.  -LFTs have decreased significantly. -Seen by GI and surgery at [facility]-->transferred to [facility] due to medical complexity -No plan for ERCP at present. -01/05/2015 --Abdominal ultrasound- cholelithiasis without cholecystitis with a suspected 0.8 cm echogenic adherent stone versus polyp in the gallbladder -Appreciate Dr.  Georgette Dover -01/07/15--laparoscopic cholecystectomy--filling defects noted in intraoperative cholangiogram -01/08/15--started on ceftriaxone D#2 by GI secondary to increased obesity and increased LFTs after surgery--?cholangitis -01/09/2015--ERCP--sphincterotomy and stone removal; stent left in place -LFTs amd bili improving even prior to ERCP  RLL Infiltrate -CT abdomen shows infiltrate of the right lung base, however patient isn't having any signs and symptoms of pneumonia at this time and as such will not initiate antibiotics.  Chronic kidney disease stage V -Not on dialysis. -Baseline creatinine 4.8-5.1  -01/07/15-Consult nephrology--appreciate followup -continue torsemide for now as he appears hypervolemic clinically -will have to sign AMA if he wants to leave due to worsening renal function  Nonischemic cardiomyopathy  -04/05/2014 echo EF 20-25% and grade 2 diastolic dysfunction. -Patient is clinically compensated at this time -Patient is status post AICD placement. -resume home dose of torsemide 100 mg twice a day -Dry weight 190-193 pounds -Daily weights  -Continue metoprolol succinate  -Cleared for surgery by cardiology Diabetes mellitus -reduced lantus for npo status for surgery/ERCP-->increase to 14 units now that he is eating again -novolog SSI -01/03/2015 hemoglobin A1c 5.9  Hypertension -Continue metoprolol succinate, hydralazine, clonidine Hypokalemia  -Repleted -Check magnesium--2.1   Code Status: Full code  Family Communication: Wife updated at bedside 12/23 Disposition Plan: To be determined  Consultants:  Dr. Laural Golden, GI   Dr. Arnoldo Morale, surgery   Dr. Georgette Dover, surgery  Dr. Oletta Lamas GI   Procedures/Studies: Ct Abdomen Pelvis Wo Contrast  01/03/2015  CLINICAL DATA:  Right upper and right lower quadrant pain. Pain radiates to the flank and back. EXAM: CT ABDOMEN AND PELVIS WITHOUT CONTRAST TECHNIQUE: Multidetector CT imaging of the abdomen and pelvis  was performed following the standard protocol without IV contrast. COMPARISON:  Renal ultrasound 05/21/2014 FINDINGS: Lower chest: Small focal parenchymal opacity in the posterior lateral right lower lobe. Minimal pleural thickening bilaterally. Punctate granuloma in the right lower lobe. Multi chamber cardiomegaly. Pacemaker wires are  partially included. Liver: Scattered hypodense lesions in the liver, majority sub centimeter and too small to accurately characterize, largest in the left lobe measures 3.7 cm. Hepatobiliary: Layering gallstones within the gallbladder. Gallbladder physiologically distended. No pericholecystic inflammation. No evidence of biliary dilatation, however common bile duct is suboptimally defined. Pancreas: No ductal dilatation or surrounding inflammation. Spleen: No focal lesion. Adrenal glands: No nodule. Kidneys: No hydronephrosis or urolithiasis. Multiple bilateral renal lesions, majority small in size and representing simple cysts. However, there is a hyperdense 9 mm lesion in the medial mid left kidney, with a complex appearing 2 cm hypodense lesion in the left mid upper pole. Stomach/Bowel: Stomach physiologically distended. There are no dilated or thickened small bowel loops. Small volume of stool throughout the colon without colonic wall thickening. The appendix is normal. Vascular/Lymphatic: No retroperitoneal adenopathy. Abdominal aorta is normal in caliber. Moderate atherosclerosis and tortuosity of the abdominal aorta without aneurysm. Reproductive: Prostate gland normal in size, central prostatic calcification. Bladder: Physiologically distended, no wall thickening. Other: No free air, free fluid, or intra-abdominal fluid collection. Small fat containing umbilical hernia. Musculoskeletal: There are no acute or suspicious osseous abnormalities. Degenerative change in the lumbar spine. IMPRESSION: 1. Small peripheral consolidation in the right lung base, this abuts the pleura and  may be the cause of patient's right-sided pain. This likely represent small focal pneumonia. Chest radiograph correlation may be helpful, particularly for follow-up which is recommended in 3-4 weeks. 2. No acute intra-abdominal/pelvic abnormality. 3. Bilateral renal lesions, incompletely characterized without contrast. Majority of these are small and appear to represent cysts. Question of complex cystic lesion in the left kidney, corresponding to that seen on prior ultrasound. Additionally there is a hyperdense subcentimeter lesion in the left kidney. Nonemergent renal protocol MRI with and with contrast is recommended for further characterization. Electronically Signed   By: Jeb Levering M.D.   On: 01/03/2015 19:16   Dg Cholangiogram Operative  01/07/2015  CLINICAL DATA:  gallstone pancreatitis EXAM: INTRAOPERATIVE CHOLANGIOGRAM TECHNIQUE: Cholangiographic images from the C-arm fluoroscopic device were submitted for interpretation post-operatively. Please see the procedural report for the amount of contrast and the fluoroscopy time utilized. COMPARISON:  None. FINDINGS: At least 3 filling defects noted in the distal common duct. There is a meniscus in the distal common duct. Minimal contrast passes into the duodenum. There is minimal contrast reflux into the pancreatic duct. The intrahepatic bile ducts are incompletely visualized, appearing mildly distended centrally. IMPRESSION: 1. Obstructing distal choledocholithiasis. Electronically Signed   By: Lucrezia Europe M.D.   On: 01/07/2015 12:43   US Abdomen Complete  01/05/2015  CLINICAL DATA:  Elevated LFTs.  Pancreatitis. EXAM: ABDOMEN ULTRASOUND COMPLETE COMPARISON:  Renal ultrasound - 05/21/2014 ; CT abdomen pelvis - 01/03/2015 FINDINGS: Gallbladder: There are several echogenic shadowing stones seen within otherwise normal-appearing gallbladder (representative image 12). Additionally, there is a approximately 0.8 x 0.6 cm echogenic structure within the  nondependent portion of the gallbladder (images 17 and 21) which may represent either an adherent stone versus a gallbladder polyp. No gallbladder wall thickening or pericholecystic fluid. Negative sonographic Murphy's sign. Common bile duct: Diameter: Normal in size measuring 5 mm in diameter Liver: There is an approximately 3.5 x 3.8 x 2.7 cm anechoic cyst within the subcapsular aspect of the left lobe of the liver. There is an additional punctate (approximately 0.7 cm) hypo attenuating lesion in the subcapsular aspect the right lobe of the liver which is too small to accurately characterize though favored to represent additional hepatic  cysts. Otherwise, normal appearance of the liver. No additional hepatic lesions identified. No intrahepatic biliary duct dilatation. No ascites. IVC: No abnormality visualized. Pancreas: Limited visualization of the pancreatic head and neck is normal. Visualization of the pancreatic body and tail is obscured by bowel gas. Spleen: Obscured by bowel gas Right Kidney: Normal in size measuring 11.5 cm in diameter. There is preserved renal cortical thickness, however there is diffuse increased renal cortical echogenicity (representative image 95), similar to renal ultrasound performed 05/2014. Grossly unchanged approximately 1.8 x 1.6 x 1.8 cm partially exophytic cyst arising from the superior pole the right kidney, unchanged since the 05/2014 examination. No evidence of right-sided urinary obstruction. No definite echogenic renal stones. Left Kidney: Normal in size measuring 11.8 cm in length. There is preserved left renal cortical thickness though there is unchanged mild diffuse increased echogenicity of the left renal parenchyma. Unchanged mild lobularity of the left renal contour. Grossly unchanged approximately 2.5 x 2.1 x 2.9 cm partially exophytic cyst arising from the superior pole the left kidney, similar to the 05/2014 examination. No echogenic renal stones. No evidence of  left-sided urinary obstruction. Abdominal aorta: No aneurysm visualized. Other findings: None. IMPRESSION: 1. Cholelithiasis without evidence cholecystitis. 2. Limited visualization of the pancreatic head is normal though the pancreas is overall suboptimally evaluated due to overlying bowel gas. Clinical history of pancreatitis in the setting of gallstones does raise the possibility of gallstone pancreatitis. Clinical correlation is advised. Further evaluation with ERCP or MRCP could be performed as indicated. 3. Suspected approximately 0.8 cm echogenic adherent stone versus polyp within otherwise normal-appearing gallbladder. If cholecystectomy is not pursued, a follow-up gallbladder ultrasound in 6-12 months is recommended to ensure stability and/or resolution. 4. Grossly unchanged bilateral renal cysts. 5. Grossly unchanged increased echogenicity of the bilateral renal parenchyma suggest medical renal disease. No evidence of urinary obstruction. Electronically Signed   By: Sandi Mariscal M.D.   On: 01/05/2015 11:41   Dg Ercp Biliary & Pancreatic Ducts  01/09/2015  CLINICAL DATA:  69 year old male with a history of choledocholithiasis. EXAM: ERCP TECHNIQUE: Multiple spot images obtained with the fluoroscopic device and submitted for interpretation post-procedure. FLUOROSCOPY TIME:  Fluoroscopy Time:  6 minutes 41 seconds COMPARISON:  CT 01/03/2015, 01/05/2015, intraoperative cholangiogram 01/07/2015 FINDINGS: Endoscope projects over the upper abdomen. Initial image demonstrates cannulation of the common bile duct/ampulla. Partial opacification of the extrahepatic biliary system with small filling defects within the ductal system. Final image demonstrates resolution of filling defects. IMPRESSION: Limited images during ERCP demonstrates cannulation of the ampulla/ ductal system, and resolution of small filling defects within the extrahepatic ductal system during the course of the exam. Please refer to the dictated  operative report for full details of intraoperative findings and procedure. Signed, Dulcy Fanny. Earleen Newport, DO [facility] Radiology Electronically Signed   By: Corrie Mckusick D.O.   On: 01/09/2015 11:38         Subjective:  patient has some abdominal pain at the surgical site. Denies any nausea, vomiting, diarrhea, headache, neck pain, chest pain, shortness breath, fevers, chills. Denies any dysuria or hematuria.  Objective: Filed Vitals:   01/09/15 1110 01/09/15 1120 01/09/15 1130 01/09/15 1142  BP: 174/77 178/60 168/94 166/59  Pulse: 82 82 84 90  Temp: 97.7 F (36.5 C)   98.3 F (36.8 C)  TempSrc: Oral   Oral  Resp: 19 25 17 18   Height:      Weight:      SpO2: 99% 100%  96% 91%    Intake/Output Summary (Last 24 hours) at 01/09/15 1710 Last data filed at 01/09/15 1625  Gross per 24 hour  Intake   1623 ml  Output    450 ml  Net   1173 ml   Weight change: 1.225 kg (2 lb 11.2 oz) Exam:   General:  Pt is alert, follows commands appropriately, not in acute distress  HEENT: No icterus, No thrush, No neck mass, [place]/AT  Cardiovascular: RRR, S1/S2, no rubs, no gallops  Respiratory: bibasilar crackles. No wheezing. Good air movement   Abdomen: Soft/+BS, non tender, non distended, no guarding  Extremities: 1+ LE edema, No lymphangitis, No petechiae, No rashes, no synovitis  Data Reviewed: Basic Metabolic Panel:  Recent Labs Lab 01/05/15 0936 01/06/15 0604 01/07/15 0640 01/08/15 0342 01/09/15 0419  NA 138 140 138 139 136  K 3.5 3.3* 3.5 3.9 4.1  CL 101 100* 101 99* 99*  CO2 25 25 22  21* 23  GLUCOSE 117* 69 107* 134* 143*  BUN 92* 88* 85* 89* 97*  CREATININE 5.18* 5.40* 5.53* 5.85* 6.71*  CALCIUM 9.1 9.5 9.2 8.8* 9.0  MG  --   --  2.1  --   --   PHOS  --   --   --  7.0*  --    Liver Function Tests:  Recent Labs Lab 01/05/15 0936 01/06/15 0604 01/07/15 0640 01/08/15 0342 01/09/15 0419  AST 43* 26 23 250* 95*    ALT 134* 99* 74* 214* 177*  ALKPHOS 115 108 97 181* 177*  BILITOT 0.8 0.8 0.7 2.3* 2.0*  PROT 6.2* 5.8* 6.0* 5.7* 5.5*  ALBUMIN 3.7 3.4* 3.5 3.2* 2.8*    Recent Labs Lab 01/03/15 1707 01/04/15 0622 01/05/15 0936 01/06/15 0604 01/08/15 0342  LIPASE 1757* 312* 50 39 49   No results for input(s): AMMONIA in the last 168 hours. CBC:  Recent Labs Lab 01/03/15 1707 01/04/15 0622 01/05/15 0936 01/06/15 0604 01/08/15 0342 01/09/15 0419  WBC 5.3 5.3 5.6 5.8 12.3* 10.8*  NEUTROABS 3.7  --   --   --   --  8.9*  HGB 10.2* 9.4* 9.6* 9.4* 9.5* 9.4*  HCT 30.7* 28.1* 27.8* 28.3* 29.4* 27.5*  MCV 89.8 89.5 88.8 89.3 91.9 88.4  PLT 188 161 175 184 166 144*   Cardiac Enzymes: No results for input(s): CKTOTAL, CKMB, CKMBINDEX, TROPONINI in the last 168 hours. BNP: Invalid input(s): POCBNP CBG:  Recent Labs Lab 01/08/15 1650 01/08/15 2124 01/09/15 0629 01/09/15 1141 01/09/15 1625  GLUCAP 209* 158* 154* 189* 137*    Recent Results (from the past 240 hour(s))  Surgical pcr screen     Status: None   Collection Time: 01/07/15  9:39 AM  Result Value Ref Range Status   MRSA, PCR NEGATIVE NEGATIVE Final   Staphylococcus aureus NEGATIVE NEGATIVE Final    Comment:        The Xpert SA Assay (FDA approved for NASAL specimens in patients over 16 years of age), is one component of a comprehensive surveillance program.  Test performance has been validated by [facility] for patients greater than or equal to 22 year old. It is not intended to diagnose infection nor to guide or monitor treatment.      Scheduled Meds: . calcitRIOL  0.25 mcg Oral Daily  . calcium acetate  667 mg Oral TID WC  . cefTRIAXone (ROCEPHIN)  IV  2 g Intravenous Q24H  . cloNIDine  0.3 mg Oral Daily  . darbepoetin (ARANESP)  injection - NON-DIALYSIS  100 mcg Subcutaneous Q Wed-1800  . ferumoxytol  510 mg Intravenous Weekly  . hydrALAZINE  100 mg Oral BID  . insulin aspart  0-9 Units Subcutaneous TID WC   . insulin glargine  10 Units Subcutaneous QHS  . metoprolol succinate  25 mg Oral Daily  . torsemide  100 mg Oral BID   Continuous Infusions:    Anahla Bevis, DO  [facility] Pager [phone]  If 7PM-7AM, please contact night-coverage [email or web address] Password [email or web address] 01/09/2015, 5:10 PM   LOS: 6 days

## 2015-01-09 NOTE — Progress Notes (Signed)
Patient Name: [name] Date of Encounter: 01/09/2015     Active Problems:   Diabetes mellitus type 2 with retinopathy ([place])   Hypokalemia   HTN (hypertension)   Nonischemic cardiomyopathy (H[facility])   Ventricular tachycardia ([place])   Gallstone pancreatitis   Pancreatitis   Acute biliary pancreatitis   Type II diabetes mellitus with nephropathy (H[facility])   Chronic cholecystitis with calculus    SUBJECTIVE  Complains of abdominal discomfort and soreness.  Awaiting ERCP this morning for choledocholithiasis.  No chest pain.  No increased dyspnea.  CURRENT MEDS . calcium acetate  667 mg Oral TID WC  . cefTRIAXone (ROCEPHIN)  IV  2 g Intravenous Q24H  . cloNIDine  0.3 mg Oral Daily  . darbepoetin (ARANESP) injection - NON-DIALYSIS  100 mcg Subcutaneous Q Wed-1800  . ferumoxytol  510 mg Intravenous Weekly  . hydrALAZINE  50 mg Oral BID  . insulin aspart  0-9 Units Subcutaneous TID WC  . insulin glargine  10 Units Subcutaneous QHS  . metoprolol succinate  25 mg Oral Daily  . torsemide  100 mg Oral BID    OBJECTIVE  Filed Vitals:   01/08/15 1227 01/08/15 1643 01/08/15 2128 01/09/15 0558  BP: 156/52 144/51 140/52 147/52  Pulse: 90 80 86 83  Temp: 98.5 F (36.9 C) 98.1 F (36.7 C) 98.2 F (36.8 C) 97.5 F (36.4 C)  TempSrc: Oral Oral Oral Oral  Resp: 20 20 16 15   Height:      Weight:    196 lb 1.6 oz (88.95 kg)  SpO2: 95% 96% 94% 95%    Intake/Output Summary (Last 24 hours) at 01/09/15 0839 Last data filed at 01/09/15 0819  Gross per 24 hour  Intake    910 ml  Output    300 ml  Net    610 ml   Filed Weights   01/07/15 0502 01/08/15 0354 01/09/15 0558  Weight: 192 lb (87.091 kg) 193 lb 6.4 oz (87.726 kg) 196 lb 1.6 oz (88.95 kg)    PHYSICAL EXAM  General: Pleasant, NAD. Neuro: Alert and oriented X 3. Moves all extremities spontaneously. Psych: Normal affect. HEENT:  Normal  Neck: Supple without bruits or JVD. Lungs:  Resp regular and unlabored, mild  inspiratory rales at bases. Heart: RRR no s3, s4, grade 2/6 systolic and diastolic murmur and aortic area Abdomen: Tender.  Decreased bowel sounds.   Accessory Clinical Findings  CBC  Recent Labs  01/08/15 0342 01/09/15 0419  WBC 12.3* 10.8*  NEUTROABS  --  8.9*  HGB 9.5* 9.4*  HCT 29.4* 27.5*  MCV 91.9 88.4  PLT 166 123456*   Basic Metabolic Panel  Recent Labs  01/07/15 0640 01/08/15 0342 01/09/15 0419  NA 138 139 136  K 3.5 3.9 4.1  CL 101 99* 99*  CO2 22 21* 23  GLUCOSE 107* 134* 143*  BUN 85* 89* 97*  CREATININE 5.53* 5.85* 6.71*  CALCIUM 9.2 8.8* 9.0  MG 2.1  --   --   PHOS  --  7.0*  --    Liver Function Tests  Recent Labs  01/08/15 0342 01/09/15 0419  AST 250* 95*  ALT 214* 177*  ALKPHOS 181* 177*  BILITOT 2.3* 2.0*  PROT 5.7* 5.5*  ALBUMIN 3.2* 2.8*    Recent Labs  01/08/15 0342  LIPASE 49   Cardiac Enzymes No results for input(s): CKTOTAL, CKMB, CKMBINDEX, TROPONINI in the last 72 hours. BNP Invalid input(s): POCBNP D-Dimer No results for input(s): DDIMER in  the last 72 hours. Hemoglobin A1C No results for input(s): HGBA1C in the last 72 hours. Fasting Lipid Panel No results for input(s): CHOL, HDL, LDLCALC, TRIG, CHOLHDL, LDLDIRECT in the last 72 hours. Thyroid Function Tests No results for input(s): TSH, T4TOTAL, T3FREE, THYROIDAB in the last 72 hours.  Invalid input(s): FREET3  TELE  Normal sinus rhythm  ECG    Radiology/Studies  Ct Abdomen Pelvis Wo Contrast  01/03/2015  CLINICAL DATA:  Right upper and right lower quadrant pain. Pain radiates to the flank and back. EXAM: CT ABDOMEN AND PELVIS WITHOUT CONTRAST TECHNIQUE: Multidetector CT imaging of the abdomen and pelvis was performed following the standard protocol without IV contrast. COMPARISON:  Renal ultrasound 05/21/2014 FINDINGS: Lower chest: Small focal parenchymal opacity in the posterior lateral right lower lobe. Minimal pleural thickening bilaterally. Punctate  granuloma in the right lower lobe. Multi chamber cardiomegaly. Pacemaker wires are partially included. Liver: Scattered hypodense lesions in the liver, majority sub centimeter and too small to accurately characterize, largest in the left lobe measures 3.7 cm. Hepatobiliary: Layering gallstones within the gallbladder. Gallbladder physiologically distended. No pericholecystic inflammation. No evidence of biliary dilatation, however common bile duct is suboptimally defined. Pancreas: No ductal dilatation or surrounding inflammation. Spleen: No focal lesion. Adrenal glands: No nodule. Kidneys: No hydronephrosis or urolithiasis. Multiple bilateral renal lesions, majority small in size and representing simple cysts. However, there is a hyperdense 9 mm lesion in the medial mid left kidney, with a complex appearing 2 cm hypodense lesion in the left mid upper pole. Stomach/Bowel: Stomach physiologically distended. There are no dilated or thickened small bowel loops. Small volume of stool throughout the colon without colonic wall thickening. The appendix is normal. Vascular/Lymphatic: No retroperitoneal adenopathy. Abdominal aorta is normal in caliber. Moderate atherosclerosis and tortuosity of the abdominal aorta without aneurysm. Reproductive: Prostate gland normal in size, central prostatic calcification. Bladder: Physiologically distended, no wall thickening. Other: No free air, free fluid, or intra-abdominal fluid collection. Small fat containing umbilical hernia. Musculoskeletal: There are no acute or suspicious osseous abnormalities. Degenerative change in the lumbar spine. IMPRESSION: 1. Small peripheral consolidation in the right lung base, this abuts the pleura and may be the cause of patient's right-sided pain. This likely represent small focal pneumonia. Chest radiograph correlation may be helpful, particularly for follow-up which is recommended in 3-4 weeks. 2. No acute intra-abdominal/pelvic abnormality. 3.  Bilateral renal lesions, incompletely characterized without contrast. Majority of these are small and appear to represent cysts. Question of complex cystic lesion in the left kidney, corresponding to that seen on prior ultrasound. Additionally there is a hyperdense subcentimeter lesion in the left kidney. Nonemergent renal protocol MRI with and with contrast is recommended for further characterization. Electronically Signed   By: Jeb Levering M.D.   On: 01/03/2015 19:16   Dg Cholangiogram Operative  01/07/2015  CLINICAL DATA:  gallstone pancreatitis EXAM: INTRAOPERATIVE CHOLANGIOGRAM TECHNIQUE: Cholangiographic images from the C-arm fluoroscopic device were submitted for interpretation post-operatively. Please see the procedural report for the amount of contrast and the fluoroscopy time utilized. COMPARISON:  None. FINDINGS: At least 3 filling defects noted in the distal common duct. There is a meniscus in the distal common duct. Minimal contrast passes into the duodenum. There is minimal contrast reflux into the pancreatic duct. The intrahepatic bile ducts are incompletely visualized, appearing mildly distended centrally. IMPRESSION: 1. Obstructing distal choledocholithiasis. Electronically Signed   By: Lucrezia Europe M.D.   On: 01/07/2015 12:43   US Abdomen Complete  01/05/2015  CLINICAL DATA:  Elevated LFTs.  Pancreatitis. EXAM: ABDOMEN ULTRASOUND COMPLETE COMPARISON:  Renal ultrasound - 05/21/2014 ; CT abdomen pelvis - 01/03/2015 FINDINGS: Gallbladder: There are several echogenic shadowing stones seen within otherwise normal-appearing gallbladder (representative image 12). Additionally, there is a approximately 0.8 x 0.6 cm echogenic structure within the nondependent portion of the gallbladder (images 17 and 21) which may represent either an adherent stone versus a gallbladder polyp. No gallbladder wall thickening or pericholecystic fluid. Negative sonographic Murphy's sign. Common bile duct: Diameter:  Normal in size measuring 5 mm in diameter Liver: There is an approximately 3.5 x 3.8 x 2.7 cm anechoic cyst within the subcapsular aspect of the left lobe of the liver. There is an additional punctate (approximately 0.7 cm) hypo attenuating lesion in the subcapsular aspect the right lobe of the liver which is too small to accurately characterize though favored to represent additional hepatic cysts. Otherwise, normal appearance of the liver. No additional hepatic lesions identified. No intrahepatic biliary duct dilatation. No ascites. IVC: No abnormality visualized. Pancreas: Limited visualization of the pancreatic head and neck is normal. Visualization of the pancreatic body and tail is obscured by bowel gas. Spleen: Obscured by bowel gas Right Kidney: Normal in size measuring 11.5 cm in diameter. There is preserved renal cortical thickness, however there is diffuse increased renal cortical echogenicity (representative image 95), similar to renal ultrasound performed 05/2014. Grossly unchanged approximately 1.8 x 1.6 x 1.8 cm partially exophytic cyst arising from the superior pole the right kidney, unchanged since the 05/2014 examination. No evidence of right-sided urinary obstruction. No definite echogenic renal stones. Left Kidney: Normal in size measuring 11.8 cm in length. There is preserved left renal cortical thickness though there is unchanged mild diffuse increased echogenicity of the left renal parenchyma. Unchanged mild lobularity of the left renal contour. Grossly unchanged approximately 2.5 x 2.1 x 2.9 cm partially exophytic cyst arising from the superior pole the left kidney, similar to the 05/2014 examination. No echogenic renal stones. No evidence of left-sided urinary obstruction. Abdominal aorta: No aneurysm visualized. Other findings: None. IMPRESSION: 1. Cholelithiasis without evidence cholecystitis. 2. Limited visualization of the pancreatic head is normal though the pancreas is overall  suboptimally evaluated due to overlying bowel gas. Clinical history of pancreatitis in the setting of gallstones does raise the possibility of gallstone pancreatitis. Clinical correlation is advised. Further evaluation with ERCP or MRCP could be performed as indicated. 3. Suspected approximately 0.8 cm echogenic adherent stone versus polyp within otherwise normal-appearing gallbladder. If cholecystectomy is not pursued, a follow-up gallbladder ultrasound in 6-12 months is recommended to ensure stability and/or resolution. 4. Grossly unchanged bilateral renal cysts. 5. Grossly unchanged increased echogenicity of the bilateral renal parenchyma suggest medical renal disease. No evidence of urinary obstruction. Electronically Signed   By: Sandi Mariscal M.D.   On: 01/05/2015 11:41    ASSESSMENT AND PLAN  1.Nonischemic cardiomyopathy ([place]) 2. Choledocholithiasis 3. 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 Not currently on hemodialysis.  Plan: Stable from cardiac standpoint for planned ERCP  Signed, Warren Danes MD

## 2015-01-09 NOTE — Progress Notes (Signed)
[name] 9:39 AM  Subjective: Patient doing well from a postop standpoint and has a different pain than the one that brought him in here and his hospital computer chart was reviewed and his case discussed with my partner Dr. Oletta Lamas and his Intra-Op cholangiogram reviewed and he has no new complaints  Objective: Vital signs stable afebrile no acute distress exam please see preassessment evaluation abdomen a little sore from his surgery labs improved  Assessment: CBD stones  Plan: Okay to proceed with ERCP with anesthesia assistance and the procedure and the risks and success rate was rediscussed with the patient  [facility]  Pager [phone] After 5PM or if no answer call [phone]

## 2015-01-09 NOTE — Transfer of Care (Signed)
Immediate Anesthesia Transfer of Care Note  Patient: [name]  Procedure(s) Performed: Procedure(s): ENDOSCOPIC RETROGRADE CHOLANGIOPANCREATOGRAPHY (ERCP) (N/A)  Patient Location: PACU  Anesthesia Type:General  Level of Consciousness: awake, alert , oriented and patient cooperative  Airway & Oxygen Therapy: Patient Spontanous Breathing and Patient connected to nasal cannula oxygen  Post-op Assessment: Report given to RN and Post -op Vital signs reviewed and stable  Post vital signs: Reviewed and stable  Last Vitals:  Filed Vitals:   01/09/15 0900 01/09/15 1108  BP:  174/77  Pulse: 85 84  Temp:    Resp:  19    Complications: No apparent anesthesia complications

## 2015-01-09 NOTE — Op Note (Signed)
[facility] [place], [id number]   ERCP PROCEDURE REPORT  PATIENT: [name], [name]  MR# :[id number] BIRTHDATE: [birth year]  GENDER: male ENDOSCOPIST: Clarene Essex, MD REFERRED BY: Storm Frisk, M.D. PROCEDURE DATE:  01/09/2015 PROCEDURE:   ERCP with sphincterotomy/papillotomy, ERCP with removal of calculus/calculi , and ERCP with stent placement ASA CLASS:    3 INDICATIONS: CBD stonesMEDICATIONS:    general anesthesia TOPICAL ANESTHETIC:  none  DESCRIPTION OF PROCEDURE:   After the risks benefits and alternatives of the procedure were thoroughly explained, informed consent was obtained.  The Pentax Ercp Scope [id number]  endoscope was introduced through the mouth and advanced to the second portion of the duodenum .a moderate-sized ampulla with a hood was brought into view and using the triple lumen sphincterotome loaded with the JAG Jagwire we tried to cannulate the CBD but the wire was advanced towards the pancreas a few times and we elected to place a pancreatic stent using the 5 French 3 cm single pigtail and once done the CBD was easily cannulated and deep selective cannulation was obtained and dye was injected which confirmed the CBD stone and a moderate size sphincterotomy was made in the customary fashion initially without the magnet but then with the magnet without any problems and there was adequate biliary drainage and we Get the fully bowed sphincterotome easily in and out of the duct and we exchanged the sphincterotome for the adjustable balloon and proceeded with 3 balloon pull-through was using the 12 mm balloon and on the first pull-through a small stone was delivered but no obvious stones on subsequent pull-through's and our occlusion cholangiogram was then done which was normal and the balloon was withdrawn one more time and there was no resistance in withdrawing the 12 mm balloon and there was sluggish but positive drainage  and we elected to stop the procedure at this point and we elected to leave the pancreatic stent in place and allow it to pass and the patient tolerated the procedure well there was no obvious immediate complication Estimated blood loss is zero unless otherwise noted in this procedure report.       COMPLICATIONS:   none  ENDOSCOPIC IMPRESSION:1. Moderate sized ampulla with a hood2. No pancreatic dye injection but few wire advancements and stent placement as above 3. CBD stone removed with sphincterotomy and balloon extraction 4. Negative occlusion cholangiogram and sluggish but positive drainage at the end of the procedure  RECOMMENDATIONS:customary post-ERCP orders observe for delayed complications slowly advance diet repeat abdominal x-ray prior to discharge and if stent still in place repeat x-ray middle of next week and follow liver tests back to normal and I'm happy to see back when necessary particularly if stent does not pass on its own or if any GI symptoms continue or if liver tests do not return to normal     _______________________________ eSigned:  Clarene Essex, MD 01/09/2015 11:05 AM   ET:[id number] Georgette Dover, MD

## 2015-01-10 ENCOUNTER — Inpatient Hospital Stay (HOSPITAL_COMMUNITY): Payer: Medicare Other

## 2015-01-10 DIAGNOSIS — K830 Cholangitis: Secondary | ICD-10-CM

## 2015-01-10 DIAGNOSIS — I429 Cardiomyopathy, unspecified: Secondary | ICD-10-CM

## 2015-01-10 DIAGNOSIS — N184 Chronic kidney disease, stage 4 (severe): Secondary | ICD-10-CM

## 2015-01-10 LAB — CBC WITH DIFFERENTIAL/PLATELET
BASOS PCT: 0 %
Basophils Absolute: 0 10*3/uL (ref 0.0–0.1)
EOS ABS: 0.6 10*3/uL (ref 0.0–0.7)
Eosinophils Relative: 7 %
HCT: 26.7 % — ABNORMAL LOW (ref 39.0–52.0)
Hemoglobin: 9.2 g/dL — ABNORMAL LOW (ref 13.0–17.0)
Lymphocytes Relative: 11 %
Lymphs Abs: 0.9 10*3/uL (ref 0.7–4.0)
MCH: 30.6 pg (ref 26.0–34.0)
MCHC: 34.5 g/dL (ref 30.0–36.0)
MCV: 88.7 fL (ref 78.0–100.0)
MONO ABS: 0.6 10*3/uL (ref 0.1–1.0)
MONOS PCT: 8 %
NEUTROS PCT: 74 %
Neutro Abs: 6.3 10*3/uL (ref 1.7–7.7)
PLATELETS: 151 10*3/uL (ref 150–400)
RBC: 3.01 MIL/uL — ABNORMAL LOW (ref 4.22–5.81)
RDW: 15.4 % (ref 11.5–15.5)
WBC: 8.5 10*3/uL (ref 4.0–10.5)

## 2015-01-10 LAB — COMPREHENSIVE METABOLIC PANEL
ALBUMIN: 2.9 g/dL — AB (ref 3.5–5.0)
ALT: 168 U/L — ABNORMAL HIGH (ref 17–63)
ANION GAP: 14 (ref 5–15)
AST: 94 U/L — ABNORMAL HIGH (ref 15–41)
Alkaline Phosphatase: 180 U/L — ABNORMAL HIGH (ref 38–126)
BUN: 107 mg/dL — ABNORMAL HIGH (ref 6–20)
CO2: 23 mmol/L (ref 22–32)
Calcium: 8.7 mg/dL — ABNORMAL LOW (ref 8.9–10.3)
Chloride: 98 mmol/L — ABNORMAL LOW (ref 101–111)
Creatinine, Ser: 7.64 mg/dL — ABNORMAL HIGH (ref 0.61–1.24)
GFR calc Af Amer: 7 mL/min — ABNORMAL LOW (ref >60)
GFR calc non Af Amer: 6 mL/min — ABNORMAL LOW (ref >60)
GLUCOSE: 122 mg/dL — AB (ref 65–99)
POTASSIUM: 4.1 mmol/L (ref 3.5–5.1)
SODIUM: 135 mmol/L (ref 135–145)
Total Bilirubin: 1.3 mg/dL — ABNORMAL HIGH (ref 0.3–1.2)
Total Protein: 5.8 g/dL — ABNORMAL LOW (ref 6.5–8.1)

## 2015-01-10 LAB — GLUCOSE, CAPILLARY
GLUCOSE-CAPILLARY: 147 mg/dL — AB (ref 65–99)
GLUCOSE-CAPILLARY: 136 mg/dL — AB (ref 65–99)
Glucose-Capillary: 119 mg/dL — ABNORMAL HIGH (ref 65–99)
Glucose-Capillary: 197 mg/dL — ABNORMAL HIGH (ref 65–99)

## 2015-01-10 MED ORDER — GI COCKTAIL ~~LOC~~
30.0000 mL | Freq: Once | ORAL | Status: AC
  Administered 2015-01-10: 30 mL via ORAL
  Filled 2015-01-10: qty 30

## 2015-01-10 NOTE — Progress Notes (Signed)
[facility] PROGRESS NOTE Subjective patient had ERCP with sphincterotomy yesterday and removal of CBD stones. It was a complicated procedure and did require placement of a temporary pancreatic stem. He is tolerating diet has mild discomfort in his abdomen but complains mostly of a sore throat was swallowing which is better today than yesterday  Objective: Vital signs in last 24 hours: Temp:  [98.3 F (36.8 C)-98.8 F (37.1 C)] 98.5 F (36.9 C) (12/24 0636) Pulse Rate:  [88-93] 88 (12/24 0636) Resp:  [18] 18 (12/24 0636) BP: (156-166)/(58-60) 161/58 mmHg (12/24 0636) SpO2:  [91 %-97 %] 97 % (12/24 0636) Weight:  [89.359 kg (197 lb)] 89.359 kg (197 lb) (12/24 0636) Last BM Date: 01/06/15  Intake/Output from previous day: 12/23 0701 - 12/24 0700 In: 1588 [P.O.:588; I.V.:1000] Out: 475 [Urine:475] Intake/Output this shift: Total I/O In: 240 [P.O.:240] Out: -   PE: General-- no acute distress  Abdomen-- nondistended and soft  Lab Results:  Recent Labs  01/08/15 0342 01/09/15 0419 01/10/15 0457  WBC 12.3* 10.8* 8.5  HGB 9.5* 9.4* 9.2*  HCT 29.4* 27.5* 26.7*  PLT 166 144* 151   BMET  Recent Labs  01/08/15 0342 01/09/15 0419 01/10/15 0457  NA 139 136 135  K 3.9 4.1 4.1  CL 99* 99* 98*  CO2 21* 23 23  CREATININE 5.85* 6.71* 7.64*   LFT  Recent Labs  01/08/15 0342 01/09/15 0419 01/10/15 0457  PROT 5.7* 5.5* 5.8*  AST 250* 95* 94*  ALT 214* 177* 168*  ALKPHOS 181* 177* 180*  BILITOT 2.3* 2.0* 1.3*   PT/INR No results for input(s): LABPROT, INR in the last 72 hours. PANCREAS  Recent Labs  01/08/15 0342  LIPASE 49         Studies/Results: Dg Ercp Biliary & Pancreatic Ducts  01/09/2015  CLINICAL DATA:  69 year old male with a history of choledocholithiasis. EXAM: ERCP TECHNIQUE: Multiple spot images obtained with the fluoroscopic device and submitted for interpretation post-procedure. FLUOROSCOPY TIME:  Fluoroscopy Time:  6 minutes  41 seconds COMPARISON:  CT 01/03/2015, 01/05/2015, intraoperative cholangiogram 01/07/2015 FINDINGS: Endoscope projects over the upper abdomen. Initial image demonstrates cannulation of the common bile duct/ampulla. Partial opacification of the extrahepatic biliary system with small filling defects within the ductal system. Final image demonstrates resolution of filling defects. IMPRESSION: Limited images during ERCP demonstrates cannulation of the ampulla/ ductal system, and resolution of small filling defects within the extrahepatic ductal system during the course of the exam. Please refer to the dictated operative report for full details of intraoperative findings and procedure. Signed, Dulcy Fanny. Earleen Newport, DO [facility] Electronically Signed   By: Corrie Mckusick D.O.   On: 01/09/2015 11:38    Medications: I have reviewed the patient's current medications.  Assessment/Plan: 1. CBD stone. This was removed but was a difficult procedure and did require placement of the temporary pancreatic extent. For this reason we will go ahead and obtain a KUB to see if the stent is still in place. If it is a KUB will be repeated in one week. These small students often pass on their own. He seems to be tolerating the diet could probably be discharged from our viewpoint 2. Chronic renal failure. Creatinine slowly creeping up. Patient does not seem to have accepted the fact that he may well need hemodialysis.   Jaken Fregia JR,Erskin Zinda L 01/10/2015, 11:34 AM  Pager: [phone] If no answer or after hours call [phone]

## 2015-01-10 NOTE — Progress Notes (Signed)
PROGRESS NOTE  [name] [id number] DOB: 11-09-1945 DOA: 01/03/2015 PCP: Odette Fraction, MD  HPI/Recap of past 5 hours:  69 year old male with a history of systolic and diastolic CHF, hypertension, CKD stage IV, hyperlipidemia, AICD (12/03/14), diabetes mellitus type 2 presented to the emergency department on 01/03/2015 with abdominal pain of one-day duration. He developed pain after he drinks 2 cups of coffee and ate a biscuit with sausage. Initial evaluation in the emergency department revealed AST 554, ALT 274, lipase 1757, bilirubin 1.5 and serum creatinine 5.05. Gastroenterology was consulted at [facility] who felt the patient had acute biliary pancreatitis. CT of the abdomen and pelvis revealed small peripheral RLL consolidation, left renal complex cyst, cholelithiasis without any pericholecystic inflammation or biliary ductal dilatation. 01/05/2015 abdominal ultrasound showed cholelithiasis without cholecystitis with a suspected 0.8 cm echogenic adherent stone versus polyp in the gallbladder. The patient was seen by general surgery, Dr. Arnoldo Morale, who recommended transfer to [facility] for cholecystectomy due to the patient's multiple comorbidities. After the transfer, the patient underwent a laparoscopic cholecystectomy on 01/07/2015. Intraoperative cholangiogram showed filling defects. As a result, the patient underwent ERCP on 01/09/2015. Unfortunately, his renal function continues to worsen. Nephrology was consulted to assist.  [name] renal function continues to decline. He had concerns about his renal function, but after we sat and discussed it extensively along with his family, he understands what is going on and is amenable to staying in monitoring his kidney function. He is hoping that he does not need dialysis but accepts if he does  Assessment/Plan: Active Problems:   Diabetes mellitus type 2 with retinopathy ([place]): CBG stable   Hypokalemia   HTN (hypertension)  worsening blood pressures: Continue, Jean, diuretic and Toprol with increasing hydralazine.   Nonischemic cardiomyopathy ([place] with history of ventricular tachycardia): EF of 20-25 percent and grade 2 diastolic dysfunction, status post AICD. On beta blocker. Stable.    Gallstone pancreatitis/chronic calculus cholecystitis: Status post laparoscopic cholecystectomy with follow-up ERCP and sphincterotomy with stone removal plus status post stent following post cholangitis. Patient now doing well. Tolerating by mouth. On prophylactic Rocephin for cholangitis, we'll discontinue once transaminases closer to normal    Type II diabetes mellitus with stage 5 chronic kidney disease ([place]): With creatinine trending upward, nephrology on board. Continued on torsemide. Following numbers closely. No signs of uremia. Patient may end up needing dialysis    Code Status: Full code  Family Communication: Wife and son that the bedside   Disposition Plan: Continue to monitor renal function    Consultants:  GI  Gen. surgery  Cardiology  Nephrology  Procedures:  Status post ERCP with stone removal, sphincterotomy and stent placement  Status post laparoscopic cholecystectomy   Antibiotics:  IV Rocephin 12/22-present    Objective: BP 158/58 mmHg  Pulse 94  Temp(Src) 97.5 F (36.4 C) (Oral)  Resp 20  Ht 5\' 9"  (1.753 m)  Wt 89.359 kg (197 lb)  BMI 29.08 kg/m2  SpO2 96%  Intake/Output Summary (Last 24 hours) at 01/10/15 1514 Last data filed at 01/10/15 1414  Gross per 24 hour  Intake    708 ml  Output    675 ml  Net     33 ml   Filed Weights   01/08/15 0354 01/09/15 0558 01/10/15 0636  Weight: 87.726 kg (193 lb 6.4 oz) 88.95 kg (196 lb 1.6 oz) 89.359 kg (197 lb)    Exam:   General: Alert and oriented, no acute distress   Cardiovascular: Regular rate  and rhythm, [id number], 2/6 systolic ejection murmur   Respiratory: Clear to auscultation bilaterally   Abdomen: Soft, nontender,  nondistended, positive bowel sounds   Musculoskeletal: 1+ pitting edema from the knees down    Data Reviewed: Basic Metabolic Panel:  Recent Labs Lab 01/06/15 0604 01/07/15 0640 01/08/15 0342 01/09/15 0419 01/10/15 0457  NA 140 138 139 136 135  K 3.3* 3.5 3.9 4.1 4.1  CL 100* 101 99* 99* 98*  CO2 25 22 21* 23 23  GLUCOSE 69 107* 134* 143* 122*  BUN 88* 85* 89* 97* 107*  CREATININE 5.40* 5.53* 5.85* 6.71* 7.64*  CALCIUM 9.5 9.2 8.8* 9.0 8.7*  MG  --  2.1  --   --   --   PHOS  --   --  7.0*  --   --    Liver Function Tests:  Recent Labs Lab 01/06/15 0604 01/07/15 0640 01/08/15 0342 01/09/15 0419 01/10/15 0457  AST 26 23 250* 95* 94*  ALT 99* 74* 214* 177* 168*  ALKPHOS 108 97 181* 177* 180*  BILITOT 0.8 0.7 2.3* 2.0* 1.3*  PROT 5.8* 6.0* 5.7* 5.5* 5.8*  ALBUMIN 3.4* 3.5 3.2* 2.8* 2.9*    Recent Labs Lab 01/03/15 1707 01/04/15 0622 01/05/15 0936 01/06/15 0604 01/08/15 0342  LIPASE 1757* 312* 50 39 49   No results for input(s): AMMONIA in the last 168 hours. CBC:  Recent Labs Lab 01/03/15 1707  01/05/15 0936 01/06/15 0604 01/08/15 0342 01/09/15 0419 01/10/15 0457  WBC 5.3  < > 5.6 5.8 12.3* 10.8* 8.5  NEUTROABS 3.7  --   --   --   --  8.9* 6.3  HGB 10.2*  < > 9.6* 9.4* 9.5* 9.4* 9.2*  HCT 30.7*  < > 27.8* 28.3* 29.4* 27.5* 26.7*  MCV 89.8  < > 88.8 89.3 91.9 88.4 88.7  PLT 188  < > 175 184 166 144* 151  < > = values in this interval not displayed. Cardiac Enzymes:   No results for input(s): CKTOTAL, CKMB, CKMBINDEX, TROPONINI in the last 168 hours. BNP (last 3 results)  Recent Labs  01/22/14 2302 01/06/15 0604  BNP >4500.0* >4500.0*    ProBNP (last 3 results) No results for input(s): PROBNP in the last 8760 hours.  CBG:  Recent Labs Lab 01/09/15 1141 01/09/15 1625 01/09/15 2100 01/10/15 0715 01/10/15 1136  GLUCAP 189* 137* 152* 119* 147*    Recent Results (from the past 240 hour(s))  Surgical pcr screen     Status: None    Collection Time: 01/07/15  9:39 AM  Result Value Ref Range Status   MRSA, PCR NEGATIVE NEGATIVE Final   Staphylococcus aureus NEGATIVE NEGATIVE Final    Comment:        The Xpert SA Assay (FDA approved for NASAL specimens in patients over 53 years of age), is one component of a comprehensive surveillance program.  Test performance has been validated by [facility] for patients greater than or equal to 75 year old. It is not intended to diagnose infection nor to guide or monitor treatment.      Studies: Dg Abd 1 View  01/10/2015  CLINICAL DATA:  ERCP with sphincterotomy was performed on 01-09-15; temporary pancreatic stem was also placed. Pt has no complaints of abdominal pain. EXAM: ABDOMEN - 1 VIEW COMPARISON:  the previous day's study FINDINGS: Small bowel decompressed. Residual oral contrast and fecal material in the proximal colon, with mild gaseous distension of descending and transverse segments, which remain nondilated.  Bilateral pelvic phleboliths. Degenerative spurring in the lower thoracic and lumbar spine. Surgical clips right upper abdomen. AICD lead extends towards the right ventricular apex. IMPRESSION: 1. Nonobstructive bowel gas pattern with moderate proximal colonic fecal material and retained contrast. Electronically Signed   By: Lucrezia Europe M.D.   On: 01/10/2015 13:26    Scheduled Meds: . calcitRIOL  0.25 mcg Oral Daily  . calcium acetate  667 mg Oral TID WC  . cefTRIAXone (ROCEPHIN)  IV  2 g Intravenous Q24H  . cloNIDine  0.3 mg Oral Daily  . darbepoetin (ARANESP) injection - NON-DIALYSIS  100 mcg Subcutaneous Q Wed-1800  . ferumoxytol  510 mg Intravenous Weekly  . hydrALAZINE  100 mg Oral BID  . insulin aspart  0-9 Units Subcutaneous TID WC  . insulin glargine  10 Units Subcutaneous QHS  . metoprolol succinate  25 mg Oral Daily  . torsemide  100 mg Oral BID    Continuous Infusions:    Time spent: 25 minutes   [place]  [facility] Pager [phone] . If 7PM-7AM, please contact night-coverage at [email or web address], password [facility] 01/10/2015, 3:14 PM  LOS: 7 days

## 2015-01-10 NOTE — Progress Notes (Signed)
Subjective:    UOP poor and creatinine up- not uremic appearing- feels that worsening kidney function is due to disruption of routine- is fully prepared to leave AMA- eating well  Objective Vital signs in last 24 hours: Filed Vitals:   01/09/15 1142 01/09/15 2100 01/10/15 0029 01/10/15 0636  BP: 166/59 166/60 156/60 161/58  Pulse: 90 93 90 88  Temp: 98.3 F (36.8 C) 98.7 F (37.1 C) 98.8 F (37.1 C) 98.5 F (36.9 C)  TempSrc: Oral Oral Oral Oral  Resp: 18 18 18 18   Height:      Weight:    89.359 kg (197 lb)  SpO2: 91% 95% 95% 97%   Weight change: 0.408 kg (14.4 oz)  Intake/Output Summary (Last 24 hours) at 01/10/15 [id number] Last data filed at 01/10/15 [id number]  Gross per 24 hour  Intake   1588 ml  Output    475 ml  Net   1113 ml    Assessment/ Plan: Pt is a 69 y.o. yo male who was admitted on 01/03/2015 with gallstone pancreatitis, CM and worsening renal function with stage 5 at baseline  Assessment/Plan: 1. Renal- stage 5 at baseline- now worsening in the setting of above.  Is not uremic or at least does not admit to it and does not feel that he will need HD- He is one that I think is going to have to get sick before he will accept - he is also fully prepared to leave AMA- "I've got to go home" 2. Vol/HTN- BP high - on clonidine daily, hydralazine 50 BID, toprol 25- max torsemide- have increased hydralazine 3. Anemia- IV iron given 4. Secondary hyperparathyroidism- on phoslo- PTH 372- have added calcitriol  But need to watch for hypercalcemia 5. Dispo- if leaves AMA will try to contact him next week for labs but is fully likely he will end up getting admitted again over the weekend   [place]: Basic Metabolic Panel:  Recent Labs Lab 01/08/15 0342 01/09/15 0419 01/10/15 0457  NA 139 136 135  K 3.9 4.1 4.1  CL 99* 99* 98*  CO2 21* 23 23  GLUCOSE 134* 143* 122*  BUN 89* 97* 107*  CREATININE 5.85* 6.71* 7.64*  CALCIUM 8.8* 9.0 8.7*  PHOS 7.0*  --    --    Liver Function Tests:  Recent Labs Lab 01/08/15 0342 01/09/15 0419 01/10/15 0457  AST 250* 95* 94*  ALT 214* 177* 168*  ALKPHOS 181* 177* 180*  BILITOT 2.3* 2.0* 1.3*  PROT 5.7* 5.5* 5.8*  ALBUMIN 3.2* 2.8* 2.9*    Recent Labs Lab 01/05/15 0936 01/06/15 0604 01/08/15 0342  LIPASE 50 39 49   No results for input(s): AMMONIA in the last 168 hours. CBC:  Recent Labs Lab 01/03/15 1707  01/05/15 0936 01/06/15 0604 01/08/15 0342 01/09/15 0419 01/10/15 0457  WBC 5.3  < > 5.6 5.8 12.3* 10.8* 8.5  NEUTROABS 3.7  --   --   --   --  8.9* 6.3  HGB 10.2*  < > 9.6* 9.4* 9.5* 9.4* 9.2*  HCT 30.7*  < > 27.8* 28.3* 29.4* 27.5* 26.7*  MCV 89.8  < > 88.8 89.3 91.9 88.4 88.7  PLT 188  < > 175 184 166 144* 151  < > = values in this interval not displayed. Cardiac Enzymes: No results for input(s): CKTOTAL, CKMB, CKMBINDEX, TROPONINI in the last 168 hours. CBG:  Recent Labs Lab 01/09/15 0629 01/09/15 1141 01/09/15 1625 01/09/15 2100 01/10/15  0715  GLUCAP 154* 189* 137* 152* 119*    Iron Studies:   Recent Labs  01/07/15 1836  IRON 42*  TIBC 237*  FERRITIN 1449*   Studies/Results: Dg Ercp Biliary & Pancreatic Ducts  01/09/2015  CLINICAL DATA:  69 year old male with a history of choledocholithiasis. EXAM: ERCP TECHNIQUE: Multiple spot images obtained with the fluoroscopic device and submitted for interpretation post-procedure. FLUOROSCOPY TIME:  Fluoroscopy Time:  6 minutes 41 seconds COMPARISON:  CT 01/03/2015, 01/05/2015, intraoperative cholangiogram 01/07/2015 FINDINGS: Endoscope projects over the upper abdomen. Initial image demonstrates cannulation of the common bile duct/ampulla. Partial opacification of the extrahepatic biliary system with small filling defects within the ductal system. Final image demonstrates resolution of filling defects. IMPRESSION: Limited images during ERCP demonstrates cannulation of the ampulla/ ductal system, and resolution of small  filling defects within the extrahepatic ductal system during the course of the exam. Please refer to the dictated operative report for full details of intraoperative findings and procedure. Signed, Dulcy Fanny. Earleen Newport, DO [facility] Electronically Signed   By: Corrie Mckusick D.O.   On: 01/09/2015 11:38   Medications: Infusions:    Scheduled Medications: . calcitRIOL  0.25 mcg Oral Daily  . calcium acetate  667 mg Oral TID WC  . cefTRIAXone (ROCEPHIN)  IV  2 g Intravenous Q24H  . cloNIDine  0.3 mg Oral Daily  . darbepoetin (ARANESP) injection - NON-DIALYSIS  100 mcg Subcutaneous Q Wed-1800  . ferumoxytol  510 mg Intravenous Weekly  . hydrALAZINE  100 mg Oral BID  . insulin aspart  0-9 Units Subcutaneous TID WC  . insulin glargine  10 Units Subcutaneous QHS  . metoprolol succinate  25 mg Oral Daily  . torsemide  100 mg Oral BID    have reviewed scheduled and prn medications.  Physical Exam: General: NAD Heart: RRR Lungs: mostly clear Abdomen: tender Extremities: trace edema   01/10/2015,8:29 AM  LOS: 7 days

## 2015-01-11 DIAGNOSIS — K8010 Calculus of gallbladder with chronic cholecystitis without obstruction: Secondary | ICD-10-CM

## 2015-01-11 DIAGNOSIS — E11311 Type 2 diabetes mellitus with unspecified diabetic retinopathy with macular edema: Secondary | ICD-10-CM

## 2015-01-11 LAB — COMPREHENSIVE METABOLIC PANEL
ALT: 125 U/L — AB (ref 17–63)
ANION GAP: 16 — AB (ref 5–15)
AST: 43 U/L — AB (ref 15–41)
Albumin: 2.6 g/dL — ABNORMAL LOW (ref 3.5–5.0)
Alkaline Phosphatase: 163 U/L — ABNORMAL HIGH (ref 38–126)
BILIRUBIN TOTAL: 0.8 mg/dL (ref 0.3–1.2)
BUN: 109 mg/dL — ABNORMAL HIGH (ref 6–20)
CHLORIDE: 97 mmol/L — AB (ref 101–111)
CO2: 23 mmol/L (ref 22–32)
Calcium: 8.7 mg/dL — ABNORMAL LOW (ref 8.9–10.3)
Creatinine, Ser: 7.56 mg/dL — ABNORMAL HIGH (ref 0.61–1.24)
GFR, EST AFRICAN AMERICAN: 7 mL/min — AB (ref >60)
GFR, EST NON AFRICAN AMERICAN: 6 mL/min — AB (ref >60)
Glucose, Bld: 134 mg/dL — ABNORMAL HIGH (ref 65–99)
POTASSIUM: 3.8 mmol/L (ref 3.5–5.1)
SODIUM: 136 mmol/L (ref 135–145)
TOTAL PROTEIN: 5.2 g/dL — AB (ref 6.5–8.1)

## 2015-01-11 LAB — GLUCOSE, CAPILLARY: GLUCOSE-CAPILLARY: 132 mg/dL — AB (ref 65–99)

## 2015-01-11 MED ORDER — CALCIUM ACETATE (PHOS BINDER) 667 MG PO CAPS: 667.0000 mg | ORAL_CAPSULE | Freq: Three times a day (TID) | ORAL | Status: DC

## 2015-01-11 MED ORDER — INSULIN GLARGINE 100 UNIT/ML ~~LOC~~ SOLN: 15.0000 [IU] | Freq: Every day | SUBCUTANEOUS | Status: DC

## 2015-01-11 MED ORDER — CALCITRIOL 0.25 MCG PO CAPS: 0.2500 ug | ORAL_CAPSULE | Freq: Every day | ORAL | Status: DC

## 2015-01-11 MED ORDER — OXYCODONE HCL 5 MG PO TABS: 5.0000 mg | ORAL_TABLET | ORAL | Status: DC | PRN

## 2015-01-11 MED ORDER — HYDRALAZINE HCL 100 MG PO TABS: 100.0000 mg | ORAL_TABLET | Freq: Two times a day (BID) | ORAL | Status: DC

## 2015-01-11 MED ORDER — MENTHOL 3 MG MT LOZG
1.0000 | LOZENGE | OROMUCOSAL | Status: DC | PRN
  Filled 2015-01-11: qty 9

## 2015-01-11 NOTE — Progress Notes (Signed)
[facility] PROGRESS NOTE Subjective Pt feels better still complaining of sore throat since the ERCP but getting better eating ok.  Objective: Vital signs in last 24 hours: Temp:  [97.5 F (36.4 C)-98.1 F (36.7 C)] 97.7 F (36.5 C) (12/25 0511) Pulse Rate:  [71-94] 71 (12/25 0511) Resp:  [20] 20 (12/25 0511) BP: (133-158)/(52-58) 133/52 mmHg (12/25 0511) SpO2:  [96 %-100 %] 100 % (12/25 0511) Weight:  [90 kg (198 lb 6.6 oz)] 90 kg (198 lb 6.6 oz) (12/25 0511) Last BM Date: 01/10/15  Intake/Output from previous day: 12/24 0701 - 12/25 0700 In: 960 [P.O.:960] Out: 700 [Urine:700] Intake/Output this shift:    PE: General--sitting up eating breakfast  Abdomen--nontender  Lab Results:  Recent Labs  01/09/15 0419 01/10/15 0457  WBC 10.8* 8.5  HGB 9.4* 9.2*  HCT 27.5* 26.7*  PLT 144* 151   BMET  Recent Labs  01/09/15 0419 01/10/15 0457 01/11/15 0231  NA 136 135 136  K 4.1 4.1 3.8  CL 99* 98* 97*  CO2 23 23 23   CREATININE 6.71* 7.64* 7.56*   LFT  Recent Labs  01/09/15 0419 01/10/15 0457 01/11/15 0231  PROT 5.5* 5.8* 5.2*  AST 95* 94* 43*  ALT 177* 168* 125*  ALKPHOS 177* 180* 163*  BILITOT 2.0* 1.3* 0.8   PT/INR No results for input(s): LABPROT, INR in the last 72 hours. PANCREAS No results for input(s): LIPASE in the last 72 hours.       Studies/Results: Dg Abd 1 View  01/10/2015  CLINICAL DATA:  ERCP with sphincterotomy was performed on 01-09-15; temporary pancreatic stem was also placed. Pt has no complaints of abdominal pain. EXAM: ABDOMEN - 1 VIEW COMPARISON:  the previous day's study FINDINGS: Small bowel decompressed. Residual oral contrast and fecal material in the proximal colon, with mild gaseous distension of descending and transverse segments, which remain nondilated. Bilateral pelvic phleboliths. Degenerative spurring in the lower thoracic and lumbar spine. Surgical clips right upper abdomen. AICD lead extends towards the  right ventricular apex. IMPRESSION: 1. Nonobstructive bowel gas pattern with moderate proximal colonic fecal material and retained contrast. Electronically Signed   By: Lucrezia Europe M.D.   On: 01/10/2015 13:26   Dg Ercp Biliary & Pancreatic Ducts  01/09/2015  CLINICAL DATA:  69 year old male with a history of choledocholithiasis. EXAM: ERCP TECHNIQUE: Multiple spot images obtained with the fluoroscopic device and submitted for interpretation post-procedure. FLUOROSCOPY TIME:  Fluoroscopy Time:  6 minutes 41 seconds COMPARISON:  CT 01/03/2015, 01/05/2015, intraoperative cholangiogram 01/07/2015 FINDINGS: Endoscope projects over the upper abdomen. Initial image demonstrates cannulation of the common bile duct/ampulla. Partial opacification of the extrahepatic biliary system with small filling defects within the ductal system. Final image demonstrates resolution of filling defects. IMPRESSION: Limited images during ERCP demonstrates cannulation of the ampulla/ ductal system, and resolution of small filling defects within the extrahepatic ductal system during the course of the exam. Please refer to the dictated operative report for full details of intraoperative findings and procedure. Signed, Dulcy Fanny. Earleen Newport, DO [facility] Electronically Signed   By: Corrie Mckusick D.O.   On: 01/09/2015 11:38    Medications: I have reviewed the patient's current medications.  Assessment/Plan: 1. CBD Stone. S/p ERCP with stone removal 2 days ago with placement of temporary pancreatic stent. Appears to have passed on KUB. Sore throat improving will give cepacol lozenges. Please call us for further problems.   Reinaldo Helt JR,Gedalya Jim L 01/11/2015, 8:16 AM  Pager:  [phone] If no answer or after hours call [phone]

## 2015-01-11 NOTE — Anesthesia Postprocedure Evaluation (Signed)
Anesthesia Post Note  Patient: [name]  Procedure(s) Performed: Procedure(s) (LRB): ENDOSCOPIC RETROGRADE CHOLANGIOPANCREATOGRAPHY (ERCP) (N/A)  Patient location during evaluation: Endoscopy Anesthesia Type: General Level of consciousness: awake Pain management: pain level controlled Vital Signs Assessment: post-procedure vital signs reviewed and stable Respiratory status: spontaneous breathing Cardiovascular status: stable Postop Assessment: no signs of nausea or vomiting Anesthetic complications: no    Last Vitals:  Filed Vitals:   01/10/15 2019 01/11/15 0511  BP: 142/55 133/52  Pulse: 76 71  Temp: 36.7 C 36.5 C  Resp: 20 20    Last Pain:  Filed Vitals:   01/11/15 0514  PainSc: 0-No pain                 Nautia Lem

## 2015-01-11 NOTE — Discharge Summary (Signed)
Discharge Summary  [name] [id number] DOB: 11/21/1945  PCP: Odette Fraction, MD  Admit date: 01/03/2015 Discharge date: 01/11/2015  Time spent: 25 minutes   Recommendations for Outpatient Follow-up:  1. Medication change: Patient will stop his Zaroxolyn 2. New medication: Calcitriol 1 capsule by mouth daily 3. New medication: PhosLo 3 times a day 4. New medication: OxyIR 5 mg by mouth every 6 hours when necessary for pain 5. Medication change: Lantus decreased from 30 units subcutaneous daily to 15 units subcutaneous daily. With elevated blood sugars, patient's PCP can titrate this up accordingly 6. Medication change: Patient will stop potassium supplementation 7. Patient will follow up with [facility] in 2 weeks. 8. Patient will follow-up with [facility]. They will call him to set up appointment 9. Medication change: Hydralazine increased to 100 mg by mouth twice a day 10. Medication change: Colchicine discontinued because of renal dysfunction  Discharge Diagnoses:  Active Hospital Problems   Diagnosis Date Noted  . Common bile duct stone   . Chronic cholecystitis with calculus 01/08/2015  . Type II diabetes mellitus with nephropathy ([place]) 01/07/2015  . Acute biliary pancreatitis   . Gallstone pancreatitis 01/03/2015  . Pancreatitis 01/03/2015  . Ventricular tachycardia ([place]) 11/17/2014  . Nonischemic cardiomyopathy ([place]) 03/20/2014  . HTN (hypertension) 12/01/2013  . Hypokalemia 11/02/2013  . Diabetes mellitus type 2 with retinopathy ([place]) 02/23/2007    Resolved Hospital Problems   Diagnosis Date Noted Date Resolved  No resolved problems to display.    Discharge Condition: Improved, being discharged home   Diet recommendation: Renal diet   Filed Weights   01/09/15 0558 01/10/15 0636 01/11/15 0511  Weight: 88.95 kg (196 lb 1.6 oz) 89.359 kg (197 lb) 90 kg (198 lb 6.6 oz)    History of present illness:  69 year old male  with a history of systolic and diastolic CHF, hypertension, CKD stage IV, hyperlipidemia, AICD (12/03/14), diabetes mellitus type 2 presented to the emergency department on 01/03/2015 with abdominal pain of one-day duration. He developed pain after he drinks 2 cups of coffee and ate a biscuit with sausage. Initial evaluation in the emergency department revealed AST 554, ALT 274, lipase 1757, bilirubin 1.5 and serum creatinine 5.05. Gastroenterology was consulted at [facility] who felt the patient had acute biliary pancreatitis. CT of the abdomen and pelvis revealed small peripheral RLL consolidation, left renal complex cyst, cholelithiasis without any pericholecystic inflammation or biliary ductal dilatation.    Hospital Course:  Active Problems:   Diabetes mellitus type 2 with retinopathy [facility]): Initially with patient's very poor by mouth intake, home Levemir held. When restarted, by mouth intake is still remained only fair. At 10 units subcutaneous daily, patient's blood sugars have stayed below 200. Accounting for some increase with home diet, increased only to 15 down from his usual 30. Check his previous A1c notes slight hypoglycemia at 5.8. There may be room to titrate this up for elevated blood sugars as per patient's PCP.    Hypokalemia: Secondary to dehydration initially. Now that patient's renal function is worse, and have stopped one of his diuretics, discontinue potassium supplementation.    HTN (hypertension) essential: With worsening blood pressures in the setting of worsening renal function, patient continued on home medications. Hydralazine increased prior to discharge. Zaroxolyn discontinued given worsening renal function.    Nonischemic cardiomyopathy ([place]): Ejection fraction of 20-25 percent plus grade 2 diastolic dysfunction status post AICD. Patient continued on Toprol. Remained overall euvolemic.  Acute kidney injury in the  setting of chronic stage IV kidney disease secondary to  diabetes mellitus and hypertension/now at chronic stage V kidney disease: Since admission, renal function continued to decline. Baseline creatinine around 5. Nephrology consulted. Felt to be secondary to volume losses.  Creatinine continued to trend upward around 7. Patient not uremic. Continue to make urine. Follow by nephrology who felt that likely patient would need dialysis at some point soon. Patient reluctant. He has been hesitant in the past to even have fistula placed. However, by 12/25, creatinine which had continued to trend up slightly decreased from 7.7-7.5. Given overall stability and patient's preference for being home, felt to be stable for discharge with plans for close outpatient follow-up by nephrology    Gallstone pancreatitis/chronic calculus cholecystitis:01/05/2015 abdominal ultrasound showed cholelithiasis without cholecystitis with a suspected 0.8 cm echogenic adherent stone versus polyp in the gallbladder. The patient was seen by general surgery, Dr. Arnoldo Morale, who recommended transfer to [facility] for cholecystectomy due to the patient's multiple comorbidities. After the transfer, the patient underwent a laparoscopic cholecystectomy on 01/07/2015. Intraoperative cholangiogram showed filling defects. As a result, the patient underwent ERCP on 01/09/2015. At that time, sphincterotomy with stone removal plus stent placed done here He was put on prophylactic Rocephin for cholangitis. Over the next few days, transaminases trended downward. No need for antibiotics upon discharge. Patient tolerating by mouth. Outpatient follow-up with general surgery    Consultants:  GI  Gen. surgery  Cardiology  Nephrology  Procedures:  Status post ERCP with stone removal, sphincterotomy and stent placement  Status post laparoscopic cholecystectomy  Discharge Exam: BP 133/52 mmHg  Pulse 71  Temp(Src) 97.7 F (36.5 C) (Oral)  Resp 20  Ht 5\' 9"  (1.753 m)  Wt 90 kg (198 lb 6.6 oz)  BMI  29.29 kg/m2  SpO2 100%  General: Alert and oriented 3  Cardiovascular: Regular rate and rhythm, [id number], 2/6 systolic ejection murmur  Respiratory: Clear to auscultation bilaterally   Discharge Instructions You were cared for by a hospitalist during your hospital stay. If you have any questions about your discharge medications or the care you received while you were in the hospital after you are discharged, you can call the unit and asked to speak with the hospitalist on call if the hospitalist that took care of you is not available. Once you are discharged, your primary care physician will handle any further medical issues. Please note that NO REFILLS for any discharge medications will be authorized once you are discharged, as it is imperative that you return to your primary care physician (or establish a relationship with a primary care physician if you do not have one) for your aftercare needs so that they can reassess your need for medications and monitor your lab values.  Discharge Instructions    Diet - low sodium heart healthy    Complete by:  As directed      Increase activity slowly    Complete by:  As directed             Medication List    STOP taking these medications        COLCRYS 0.6 MG tablet  Generic drug:  colchicine     Insulin Glargine 100 UNIT/ML Solostar Pen  Commonly known as:  LANTUS SOLOSTAR  Replaced by:  insulin glargine 100 UNIT/ML injection     metolazone 2.5 MG tablet  Commonly known as:  ZAROXOLYN      TAKE these medications  aspirin EC 81 MG tablet  Take 81 mg by mouth daily.     calcitRIOL 0.25 MCG capsule  Commonly known as:  ROCALTROL  Take 1 capsule (0.25 mcg total) by mouth daily.     calcium acetate 667 MG capsule  Commonly known as:  PHOSLO  Take 1 capsule (667 mg total) by mouth 3 (three) times daily with meals.     cloNIDine 0.3 MG tablet  Commonly known as:  CATAPRES  Take 1 tablet (0.3 mg total) by mouth daily.      hydrALAZINE 100 MG tablet  Commonly known as:  APRESOLINE  Take 1 tablet (100 mg total) by mouth 2 (two) times daily.     insulin glargine 100 UNIT/ML injection  Commonly known as:  LANTUS  Inject 0.15 mLs (15 Units total) into the skin at bedtime.     metoprolol succinate 25 MG 24 hr tablet  Commonly known as:  TOPROL-XL  Take 1 tablet (25 mg total) by mouth daily.     ONE TOUCH ULTRA TEST test strip  Generic drug:  glucose blood  Check blood sugar each morning fasting and once daily 2 hours after a meal     oxyCODONE 5 MG immediate release tablet  Commonly known as:  Oxy IR/ROXICODONE  Take 1-2 tablets (5-10 mg total) by mouth every 4 (four) hours as needed for moderate pain, severe pain or breakthrough pain.     torsemide 100 MG tablet  Commonly known as:  DEMADEX  Take 1 tablet (100 mg total) by mouth 2 (two) times daily.       Allergies  Allergen Reactions  . Ace Inhibitors Cough       Follow-up Information    Follow up with [facility] On 01/21/2015.   Specialty:  General Surgery   Why:  Your appointment is at 11:15 be at the office 30 minutes early for check in.   Contact information:   [address]  [place] [address] [phone]       Follow up with Windy Kalata, MD.   Specialty:  Nephrology   Why:  Office will call you to schedule follow up appt   Contact information:   [address] [place] [address] [phone]        The results of significant diagnostics from this hospitalization (including imaging, microbiology, ancillary and laboratory) are listed below for reference.    Significant Diagnostic Studies: Ct Abdomen Pelvis Wo Contrast  01/03/2015  CLINICAL DATA:  Right upper and right lower quadrant pain. Pain radiates to the flank and back. EXAM: CT ABDOMEN AND PELVIS WITHOUT CONTRAST TECHNIQUE: Multidetector CT imaging of the abdomen and pelvis was performed following the standard protocol without IV  contrast. COMPARISON:  Renal ultrasound 05/21/2014 FINDINGS: Lower chest: Small focal parenchymal opacity in the posterior lateral right lower lobe. Minimal pleural thickening bilaterally. Punctate granuloma in the right lower lobe. Multi chamber cardiomegaly. Pacemaker wires are partially included. Liver: Scattered hypodense lesions in the liver, majority sub centimeter and too small to accurately characterize, largest in the left lobe measures 3.7 cm. Hepatobiliary: Layering gallstones within the gallbladder. Gallbladder physiologically distended. No pericholecystic inflammation. No evidence of biliary dilatation, however common bile duct is suboptimally defined. Pancreas: No ductal dilatation or surrounding inflammation. Spleen: No focal lesion. Adrenal glands: No nodule. Kidneys: No hydronephrosis or urolithiasis. Multiple bilateral renal lesions, majority small in size and representing simple cysts. However, there is a hyperdense 9 mm lesion in the medial  mid left kidney, with a complex appearing 2 cm hypodense lesion in the left mid upper pole. Stomach/Bowel: Stomach physiologically distended. There are no dilated or thickened small bowel loops. Small volume of stool throughout the colon without colonic wall thickening. The appendix is normal. Vascular/Lymphatic: No retroperitoneal adenopathy. Abdominal aorta is normal in caliber. Moderate atherosclerosis and tortuosity of the abdominal aorta without aneurysm. Reproductive: Prostate gland normal in size, central prostatic calcification. Bladder: Physiologically distended, no wall thickening. Other: No free air, free fluid, or intra-abdominal fluid collection. Small fat containing umbilical hernia. Musculoskeletal: There are no acute or suspicious osseous abnormalities. Degenerative change in the lumbar spine. IMPRESSION: 1. Small peripheral consolidation in the right lung base, this abuts the pleura and may be the cause of patient's right-sided pain. This  likely represent small focal pneumonia. Chest radiograph correlation may be helpful, particularly for follow-up which is recommended in 3-4 weeks. 2. No acute intra-abdominal/pelvic abnormality. 3. Bilateral renal lesions, incompletely characterized without contrast. Majority of these are small and appear to represent cysts. Question of complex cystic lesion in the left kidney, corresponding to that seen on prior ultrasound. Additionally there is a hyperdense subcentimeter lesion in the left kidney. Nonemergent renal protocol MRI with and with contrast is recommended for further characterization. Electronically Signed   By: Jeb Levering M.D.   On: 01/03/2015 19:16   Dg Abd 1 View  01/10/2015  CLINICAL DATA:  ERCP with sphincterotomy was performed on 01-09-15; temporary pancreatic stem was also placed. Pt has no complaints of abdominal pain. EXAM: ABDOMEN - 1 VIEW COMPARISON:  the previous day's study FINDINGS: Small bowel decompressed. Residual oral contrast and fecal material in the proximal colon, with mild gaseous distension of descending and transverse segments, which remain nondilated. Bilateral pelvic phleboliths. Degenerative spurring in the lower thoracic and lumbar spine. Surgical clips right upper abdomen. AICD lead extends towards the right ventricular apex. IMPRESSION: 1. Nonobstructive bowel gas pattern with moderate proximal colonic fecal material and retained contrast. Electronically Signed   By: Lucrezia Europe M.D.   On: 01/10/2015 13:26   Dg Cholangiogram Operative  01/07/2015  CLINICAL DATA:  gallstone pancreatitis EXAM: INTRAOPERATIVE CHOLANGIOGRAM TECHNIQUE: Cholangiographic images from the C-arm fluoroscopic device were submitted for interpretation post-operatively. Please see the procedural report for the amount of contrast and the fluoroscopy time utilized. COMPARISON:  None. FINDINGS: At least 3 filling defects noted in the distal common duct. There is a meniscus in the distal common  duct. Minimal contrast passes into the duodenum. There is minimal contrast reflux into the pancreatic duct. The intrahepatic bile ducts are incompletely visualized, appearing mildly distended centrally. IMPRESSION: 1. Obstructing distal choledocholithiasis. Electronically Signed   By: Lucrezia Europe M.D.   On: 01/07/2015 12:43   US Abdomen Complete  01/05/2015  CLINICAL DATA:  Elevated LFTs.  Pancreatitis. EXAM: ABDOMEN ULTRASOUND COMPLETE COMPARISON:  Renal ultrasound - 05/21/2014 ; CT abdomen pelvis - 01/03/2015 FINDINGS: Gallbladder: There are several echogenic shadowing stones seen within otherwise normal-appearing gallbladder (representative image 12). Additionally, there is a approximately 0.8 x 0.6 cm echogenic structure within the nondependent portion of the gallbladder (images 17 and 21) which may represent either an adherent stone versus a gallbladder polyp. No gallbladder wall thickening or pericholecystic fluid. Negative sonographic Murphy's sign. Common bile duct: Diameter: Normal in size measuring 5 mm in diameter Liver: There is an approximately 3.5 x 3.8 x 2.7 cm anechoic cyst within the subcapsular aspect of the left lobe of the liver. There is an additional  punctate (approximately 0.7 cm) hypo attenuating lesion in the subcapsular aspect the right lobe of the liver which is too small to accurately characterize though favored to represent additional hepatic cysts. Otherwise, normal appearance of the liver. No additional hepatic lesions identified. No intrahepatic biliary duct dilatation. No ascites. IVC: No abnormality visualized. Pancreas: Limited visualization of the pancreatic head and neck is normal. Visualization of the pancreatic body and tail is obscured by bowel gas. Spleen: Obscured by bowel gas Right Kidney: Normal in size measuring 11.5 cm in diameter. There is preserved renal cortical thickness, however there is diffuse increased renal cortical echogenicity (representative image 95),  similar to renal ultrasound performed 05/2014. Grossly unchanged approximately 1.8 x 1.6 x 1.8 cm partially exophytic cyst arising from the superior pole the right kidney, unchanged since the 05/2014 examination. No evidence of right-sided urinary obstruction. No definite echogenic renal stones. Left Kidney: Normal in size measuring 11.8 cm in length. There is preserved left renal cortical thickness though there is unchanged mild diffuse increased echogenicity of the left renal parenchyma. Unchanged mild lobularity of the left renal contour. Grossly unchanged approximately 2.5 x 2.1 x 2.9 cm partially exophytic cyst arising from the superior pole the left kidney, similar to the 05/2014 examination. No echogenic renal stones. No evidence of left-sided urinary obstruction. Abdominal aorta: No aneurysm visualized. Other findings: None. IMPRESSION: 1. Cholelithiasis without evidence cholecystitis. 2. Limited visualization of the pancreatic head is normal though the pancreas is overall suboptimally evaluated due to overlying bowel gas. Clinical history of pancreatitis in the setting of gallstones does raise the possibility of gallstone pancreatitis. Clinical correlation is advised. Further evaluation with ERCP or MRCP could be performed as indicated. 3. Suspected approximately 0.8 cm echogenic adherent stone versus polyp within otherwise normal-appearing gallbladder. If cholecystectomy is not pursued, a follow-up gallbladder ultrasound in 6-12 months is recommended to ensure stability and/or resolution. 4. Grossly unchanged bilateral renal cysts. 5. Grossly unchanged increased echogenicity of the bilateral renal parenchyma suggest medical renal disease. No evidence of urinary obstruction. Electronically Signed   By: Sandi Mariscal M.D.   On: 01/05/2015 11:41   Dg Ercp Biliary & Pancreatic Ducts  01/09/2015  CLINICAL DATA:  69 year old male with a history of choledocholithiasis. EXAM: ERCP TECHNIQUE: Multiple spot images  obtained with the fluoroscopic device and submitted for interpretation post-procedure. FLUOROSCOPY TIME:  Fluoroscopy Time:  6 minutes 41 seconds COMPARISON:  CT 01/03/2015, 01/05/2015, intraoperative cholangiogram 01/07/2015 FINDINGS: Endoscope projects over the upper abdomen. Initial image demonstrates cannulation of the common bile duct/ampulla. Partial opacification of the extrahepatic biliary system with small filling defects within the ductal system. Final image demonstrates resolution of filling defects. IMPRESSION: Limited images during ERCP demonstrates cannulation of the ampulla/ ductal system, and resolution of small filling defects within the extrahepatic ductal system during the course of the exam. Please refer to the dictated operative report for full details of intraoperative findings and procedure. Signed, Dulcy Fanny. Earleen Newport, DO [facility] Electronically Signed   By: Corrie Mckusick D.O.   On: 01/09/2015 11:38    Microbiology: Recent Results (from the past 240 hour(s))  Surgical pcr screen     Status: None   Collection Time: 01/07/15  9:39 AM  Result Value Ref Range Status   MRSA, PCR NEGATIVE NEGATIVE Final   Staphylococcus aureus NEGATIVE NEGATIVE Final    Comment:        The Xpert SA Assay (FDA approved for NASAL specimens in patients over 21 years  of age), is one component of a comprehensive surveillance program.  Test performance has been validated by [facility] for patients greater than or equal to 36 year old. It is not intended to diagnose infection nor to guide or monitor treatment.      Labs: Basic Metabolic Panel:  Recent Labs Lab 01/07/15 0640 01/08/15 0342 01/09/15 0419 01/10/15 0457 01/11/15 0231  NA 138 139 136 135 136  K 3.5 3.9 4.1 4.1 3.8  CL 101 99* 99* 98* 97*  CO2 22 21* 23 23 23   GLUCOSE 107* 134* 143* 122* 134*  BUN 85* 89* 97* 107* 109*  CREATININE 5.53* 5.85* 6.71* 7.64* 7.56*    CALCIUM 9.2 8.8* 9.0 8.7* 8.7*  MG 2.1  --   --   --   --   PHOS  --  7.0*  --   --   --    Liver Function Tests:  Recent Labs Lab 01/07/15 0640 01/08/15 0342 01/09/15 0419 01/10/15 0457 01/11/15 0231  AST 23 250* 95* 94* 43*  ALT 74* 214* 177* 168* 125*  ALKPHOS 97 181* 177* 180* 163*  BILITOT 0.7 2.3* 2.0* 1.3* 0.8  PROT 6.0* 5.7* 5.5* 5.8* 5.2*  ALBUMIN 3.5 3.2* 2.8* 2.9* 2.6*    Recent Labs Lab 01/05/15 0936 01/06/15 0604 01/08/15 0342  LIPASE 50 39 49   No results for input(s): AMMONIA in the last 168 hours. CBC:  Recent Labs Lab 01/05/15 0936 01/06/15 0604 01/08/15 0342 01/09/15 0419 01/10/15 0457  WBC 5.6 5.8 12.3* 10.8* 8.5  NEUTROABS  --   --   --  8.9* 6.3  HGB 9.6* 9.4* 9.5* 9.4* 9.2*  HCT 27.8* 28.3* 29.4* 27.5* 26.7*  MCV 88.8 89.3 91.9 88.4 88.7  PLT 175 184 166 144* 151   Cardiac Enzymes: No results for input(s): CKTOTAL, CKMB, CKMBINDEX, TROPONINI in the last 168 hours. BNP: BNP (last 3 results)  Recent Labs  01/22/14 2302 01/06/15 0604  BNP >4500.0* >4500.0*    ProBNP (last 3 results) No results for input(s): PROBNP in the last 8760 hours.  CBG:  Recent Labs Lab 01/10/15 0715 01/10/15 1136 01/10/15 1616 01/10/15 2119 01/11/15 0700  GLUCAP 119* 147* 136* 197* 132*       Signed:  Aahil Fredin K  [facility] 01/11/2015, 4:40 PM

## 2015-01-11 NOTE — Progress Notes (Signed)
Subjective:    UOP better and creatinine stable- not uremic appearing- was going to leave AMA yesterday but thought better of it  Objective Vital signs in last 24 hours: Filed Vitals:   01/10/15 0636 01/10/15 1229 01/10/15 2019 01/11/15 0511  BP: 161/58 158/58 142/55 133/52  Pulse: 88 94 76 71  Temp: 98.5 F (36.9 C) 97.5 F (36.4 C) 98.1 F (36.7 C) 97.7 F (36.5 C)  TempSrc: Oral Oral Oral Oral  Resp: 18 20 20 20   Height:      Weight: 89.359 kg (197 lb)   90 kg (198 lb 6.6 oz)  SpO2: 97% 96% 96% 100%   Weight change: 0.641 kg (1 lb 6.6 oz)  Intake/Output Summary (Last 24 hours) at 01/11/15 0910 Last data filed at 01/11/15 0700  Gross per 24 hour  Intake    720 ml  Output    700 ml  Net     20 ml    Assessment/ Plan: Pt is a 69 y.o. yo male who was admitted on 01/03/2015 with gallstone pancreatitis, CM and worsening renal function with stage 5 at baseline  Assessment/Plan: 1. Renal- stage 5 at baseline- now worsening in the setting of above.  Is not uremic or at least does not admit to it and does not feel that he will need HD- He is one that I think is going to have to get sick before he will accept - I feel better now that creatinine seems to be plateauing and UOP picking up.  I think he understands somewhat the severity and I trust at least that he will follow up.  I am OK with discharge today and I will arrange labs thru our office next week and a follow up with [facility]  2. Vol/HTN- BP high - on clonidine daily, hydralazine 50 BID, toprol 25- max torsemide- have increased hydralazine- is better 3. Anemia- IV iron given and is on ESA- we will arrange continuation at follow up  4. Secondary hyperparathyroidism- on phoslo- PTH 372- have added calcitriol  But need to watch for hypercalcemia 5. Dispo- as above, I am comfortable with discharge now that creatinine seems to be stabilizing- I will let Dr. [place] Pink know and arrange follow up labs and appt with our office     [place]: Basic Metabolic Panel:  Recent Labs Lab 01/08/15 0342 01/09/15 0419 01/10/15 0457 01/11/15 0231  NA 139 136 135 136  K 3.9 4.1 4.1 3.8  CL 99* 99* 98* 97*  CO2 21* 23 23 23   GLUCOSE 134* 143* 122* 134*  BUN 89* 97* 107* 109*  CREATININE 5.85* 6.71* 7.64* 7.56*  CALCIUM 8.8* 9.0 8.7* 8.7*  PHOS 7.0*  --   --   --    Liver Function Tests:  Recent Labs Lab 01/09/15 0419 01/10/15 0457 01/11/15 0231  AST 95* 94* 43*  ALT 177* 168* 125*  ALKPHOS 177* 180* 163*  BILITOT 2.0* 1.3* 0.8  PROT 5.5* 5.8* 5.2*  ALBUMIN 2.8* 2.9* 2.6*    Recent Labs Lab 01/05/15 0936 01/06/15 0604 01/08/15 0342  LIPASE 50 39 49   No results for input(s): AMMONIA in the last 168 hours. CBC:  Recent Labs Lab 01/05/15 0936 01/06/15 0604 01/08/15 0342 01/09/15 0419 01/10/15 0457  WBC 5.6 5.8 12.3* 10.8* 8.5  NEUTROABS  --   --   --  8.9* 6.3  HGB 9.6* 9.4* 9.5* 9.4* 9.2*  HCT 27.8* 28.3* 29.4* 27.5* 26.7*  MCV 88.8  89.3 91.9 88.4 88.7  PLT 175 184 166 144* 151   Cardiac Enzymes: No results for input(s): CKTOTAL, CKMB, CKMBINDEX, TROPONINI in the last 168 hours. CBG:  Recent Labs Lab 01/10/15 0715 01/10/15 1136 01/10/15 1616 01/10/15 2119 01/11/15 0700  GLUCAP 119* 147* 136* 197* 132*    Iron Studies:  No results for input(s): IRON, TIBC, TRANSFERRIN, FERRITIN in the last 72 hours. Studies/Results: Dg Abd 1 View  01/10/2015  CLINICAL DATA:  ERCP with sphincterotomy was performed on 01-09-15; temporary pancreatic stem was also placed. Pt has no complaints of abdominal pain. EXAM: ABDOMEN - 1 VIEW COMPARISON:  the previous day's study FINDINGS: Small bowel decompressed. Residual oral contrast and fecal material in the proximal colon, with mild gaseous distension of descending and transverse segments, which remain nondilated. Bilateral pelvic phleboliths. Degenerative spurring in the lower thoracic and lumbar spine. Surgical clips right  upper abdomen. AICD lead extends towards the right ventricular apex. IMPRESSION: 1. Nonobstructive bowel gas pattern with moderate proximal colonic fecal material and retained contrast. Electronically Signed   By: Lucrezia Europe M.D.   On: 01/10/2015 13:26   Dg Ercp Biliary & Pancreatic Ducts  01/09/2015  CLINICAL DATA:  69 year old male with a history of choledocholithiasis. EXAM: ERCP TECHNIQUE: Multiple spot images obtained with the fluoroscopic device and submitted for interpretation post-procedure. FLUOROSCOPY TIME:  Fluoroscopy Time:  6 minutes 41 seconds COMPARISON:  CT 01/03/2015, 01/05/2015, intraoperative cholangiogram 01/07/2015 FINDINGS: Endoscope projects over the upper abdomen. Initial image demonstrates cannulation of the common bile duct/ampulla. Partial opacification of the extrahepatic biliary system with small filling defects within the ductal system. Final image demonstrates resolution of filling defects. IMPRESSION: Limited images during ERCP demonstrates cannulation of the ampulla/ ductal system, and resolution of small filling defects within the extrahepatic ductal system during the course of the exam. Please refer to the dictated operative report for full details of intraoperative findings and procedure. Signed, Dulcy Fanny. Earleen Newport, DO [facility] Electronically Signed   By: Corrie Mckusick D.O.   On: 01/09/2015 11:38   Medications: Infusions:    Scheduled Medications: . calcitRIOL  0.25 mcg Oral Daily  . calcium acetate  667 mg Oral TID WC  . cefTRIAXone (ROCEPHIN)  IV  2 g Intravenous Q24H  . cloNIDine  0.3 mg Oral Daily  . darbepoetin (ARANESP) injection - NON-DIALYSIS  100 mcg Subcutaneous Q Wed-1800  . ferumoxytol  510 mg Intravenous Weekly  . hydrALAZINE  100 mg Oral BID  . insulin aspart  0-9 Units Subcutaneous TID WC  . insulin glargine  10 Units Subcutaneous QHS  . metoprolol succinate  25 mg Oral Daily  .  torsemide  100 mg Oral BID    have reviewed scheduled and prn medications.  Physical Exam: General: NAD Heart: RRR Lungs: mostly clear Abdomen: tender Extremities: trace edema   01/11/2015,9:10 AM  LOS: 8 days

## 2015-01-12 ENCOUNTER — Encounter (HOSPITAL_COMMUNITY): Payer: Self-pay | Admitting: Gastroenterology

## 2015-01-13 ENCOUNTER — Encounter: Payer: Self-pay | Admitting: Family Medicine

## 2015-01-14 ENCOUNTER — Other Ambulatory Visit: Payer: Self-pay | Admitting: Family Medicine

## 2015-01-14 DIAGNOSIS — N2581 Secondary hyperparathyroidism of renal origin: Secondary | ICD-10-CM | POA: Diagnosis not present

## 2015-01-14 DIAGNOSIS — N185 Chronic kidney disease, stage 5: Secondary | ICD-10-CM | POA: Diagnosis not present

## 2015-01-16 ENCOUNTER — Ambulatory Visit (INDEPENDENT_AMBULATORY_CARE_PROVIDER_SITE_OTHER): Payer: Medicare Other | Admitting: Family Medicine

## 2015-01-16 ENCOUNTER — Encounter: Payer: Self-pay | Admitting: Family Medicine

## 2015-01-16 VITALS — BP 150/58 | HR 82 | Temp 97.1°F | Resp 18 | Ht 71.0 in | Wt 196.0 lb

## 2015-01-16 DIAGNOSIS — N184 Chronic kidney disease, stage 4 (severe): Secondary | ICD-10-CM | POA: Diagnosis not present

## 2015-01-16 DIAGNOSIS — I5020 Unspecified systolic (congestive) heart failure: Secondary | ICD-10-CM | POA: Diagnosis not present

## 2015-01-16 DIAGNOSIS — M1000 Idiopathic gout, unspecified site: Secondary | ICD-10-CM | POA: Diagnosis not present

## 2015-01-16 DIAGNOSIS — Z09 Encounter for follow-up examination after completed treatment for conditions other than malignant neoplasm: Secondary | ICD-10-CM | POA: Diagnosis not present

## 2015-01-16 MED ORDER — ALBUTEROL SULFATE HFA 108 (90 BASE) MCG/ACT IN AERS: 2.0000 | INHALATION_SPRAY | Freq: Four times a day (QID) | RESPIRATORY_TRACT | Status: DC | PRN

## 2015-01-16 MED ORDER — OXYCODONE HCL 5 MG PO TABS: 5.0000 mg | ORAL_TABLET | ORAL | Status: DC | PRN

## 2015-01-16 MED ORDER — PREDNISONE 20 MG PO TABS: ORAL_TABLET | ORAL | Status: DC

## 2015-01-20 ENCOUNTER — Encounter: Payer: Self-pay | Admitting: Family Medicine

## 2015-01-20 ENCOUNTER — Other Ambulatory Visit: Payer: Medicare Other

## 2015-01-20 DIAGNOSIS — N184 Chronic kidney disease, stage 4 (severe): Secondary | ICD-10-CM

## 2015-01-20 NOTE — Progress Notes (Signed)
Subjective:    Patient ID: [name], male    DOB: 05-16-1945, 70 y.o.   MRN: [id number]  HPI  Patient was recently hospitalized with choledocholithiasis, cholecystitis, pancreatitis. He underwent cholecystectomy and ERCP with stone extraction. Patient tolerated the surgery well. He denies any abdominal pain. He denies any nausea or vomiting. He is tolerating oral intake without difficulty. He has had some diarrhea. Postoperative course however was complicated by a deterioration in his renal function. Creatinine at discharge was approximately 7. Yesterday he was seen at the dialysis center and lab work was obtained which showed his creatinine had fallen to 6.5. Fortunately on his discharge labs, there was no evidence of hyperkalemia or acidosis. Patient denies any symptoms of uremia. He does have occasional shortness of breath but he denies any chest pain, orthopnea or paroxysmal nocturnal dyspnea. Today on examination there is no pitting edema in his legs. There may be some faint bibasilar crackles but there is no evidence of fluid overload. Unfortunately, the patient has developed a severe gout exacerbation in his right foot with a deterioration in his renal function. He is barely able to walk. The foot is red hot and tender and swollen distal to the ankle. This is happened the patient frequently in the past. Due to his poor renal function, his options are limited as to what he can take for the gout exacerbation. I reviewed the discharge summary from the hospital. The patient's hydralazine was increased to 100 mg. He was instructed to discontinue Zaroxolyn which the patient was only using sparingly. He is also instructed to discontinue his potassium supplement however the patient is still taking this.  Past Medical History  Diagnosis Date  . Chronic combined systolic and diastolic CHF (congestive heart failure) ([place])     a. 02/2014 Echo: EF 20-25% (new), Gr 2 DD, mod conc LVH, mild AI/AS, mod MR,  sev dil LA, mild TR.  Marland Kitchen Nonischemic cardiomyopathy ([place])     a. 02/2014 Echo: EF 20-25%;  b. 03/2014 Cath: LM nl, LAD min irregs, LCX   . Hypertension   . Chronic kidney disease (CKD), stage IV (severe) ([place]) 12/2009    a. baseline creat now ~ 4.  . Erectile dysfunction   . Colonic polyp   . Low serum testosterone level   . Tobacco abuse   . Benign prostatic hypertrophy   . Obesity, Class I, BMI 30-34.9   . Hyperlipidemia   . Adenomatous colon polyp 2006  . H. pylori infection 2013    treated with prevpac  . Mild Ao Stenosis and Insufficiency   . AICD (automatic cardioverter/defibrillator) present     STJ device, implanted 12/03/14 Dr. Lovena Le  . Sleep apnea 2010    Initially unable to afford CPAP  . 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 Gout    Past Surgical History  Procedure Laterality Date  . Retinal laser procedure      diabetic retinopathy  . Lymph node biopsy      surgical exploration of neck-not entirely clear that this represented a lymph node biopsy  . Colonoscopy  08/23/2004    Dr. Vivi Ferns rectum, diminutive polyp of the rectosigmoid removed, inflamed focally adenomatous polyp  . Left and right heart catheterization with coronary angiogram N/A 03/19/2014    Procedure: LEFT AND RIGHT HEART CATHETERIZATION WITH CORONARY ANGIOGRAM;  Surgeon: Blane Ohara, MD;  Location: [facility] CATH LAB;  Service: Cardiovascular;  Laterality: N/A;  .  Insertion of icd  12/03/2014  . Cardiac catheterization    . Ep implantable device N/A 12/03/2014    Procedure: ICD Implant;  Surgeon: Evans Lance, MD;  Location: [place] CV LAB;  Service: Cardiovascular;  Laterality: N/A;  . Esophagogastroduodenoscopy  2013    Dr. Gala Romney: erosions reminiscent of GAVE but biopsies showed H.pylori gastritis  . Colonoscopy  2013    Dr. Gala Romney: multiple diminutive polyps in distal sigmoid segment, 4 mm pedunculated polyp at hepatic flexure. Tubular adenomas. Surveillance due  2018   . Cholecystectomy N/A 01/07/2015    Procedure: LAPAROSCOPIC CHOLECYSTECTOMY WITH INTRAOPERATIVE CHOLANGIOGRAM;  Surgeon: Donnie Mesa, MD;  Location: [place];  Service: General;  Laterality: N/A;  . Umbilical hernia repair N/A 01/07/2015    Procedure: HERNIA REPAIR UMBILICAL ADULT;  Surgeon: Donnie Mesa, MD;  Location: [place];  Service: General;  Laterality: N/A;  . Ercp N/A 01/09/2015    Procedure: ENDOSCOPIC RETROGRADE CHOLANGIOPANCREATOGRAPHY (ERCP);  Surgeon: Clarene Essex, MD;  Location: [facility] ENDOSCOPY;  Service: Endoscopy;  Laterality: N/A;   Current Outpatient Prescriptions on File Prior to Visit  Medication Sig Dispense Refill  . aspirin EC 81 MG tablet Take 81 mg by mouth daily.    . calcitRIOL (ROCALTROL) 0.25 MCG capsule Take 1 capsule (0.25 mcg total) by mouth daily. 30 capsule 1  . calcium acetate (PHOSLO) 667 MG capsule Take 1 capsule (667 mg total) by mouth 3 (three) times daily with meals. 90 capsule 1  . cloNIDine (CATAPRES) 0.3 MG tablet Take 1 tablet (0.3 mg total) by mouth daily. 90 tablet 3  . hydrALAZINE (APRESOLINE) 100 MG tablet Take 1 tablet (100 mg total) by mouth 2 (two) times daily. 60 tablet 1  . insulin glargine (LANTUS) 100 UNIT/ML injection Inject 0.15 mLs (15 Units total) into the skin at bedtime. 10 mL 11  . metoprolol succinate (TOPROL-XL) 25 MG 24 hr tablet Take 1 tablet (25 mg total) by mouth daily. 90 tablet 3  . ONE TOUCH ULTRA TEST test strip Check blood sugar each morning fasting and once daily 2 hours after a meal 100 each 5  . torsemide (DEMADEX) 100 MG tablet Take 1 tablet (100 mg total) by mouth 2 (two) times daily. 180 tablet 3   No current facility-administered medications on file prior to visit.   Allergies  Allergen Reactions  . Ace Inhibitors Cough   Social History   Social History  . Marital Status: Married    Spouse Name: N/A  . Number of Children: 4  . Years of Education: N/A   Occupational History  . Semi - Retired Dietitian    Social History Main Topics  . Smoking status: Current Every Day Smoker -- 0.50 packs/day for 56 years    Types: Cigarettes    Start date: 05/31/1958  . Smokeless tobacco: Never Used  . Alcohol Use: No  . Drug Use: No  . Sexual Activity: Not Currently   Other Topics Concern  . Not on file   Social History Narrative   Family History  Problem Relation Age of Onset  . Hypertension Mother   . Cancer Mother   . Cancer Father   . Diabetes Sister      Review of Systems  All other systems reviewed and are negative.      Objective:   Physical Exam  Constitutional: He is oriented to person, place, and time. He appears well-developed and well-nourished.  Non-toxic appearance. He does not have a sickly appearance. He  does not appear ill. No distress.  HENT:  Head: Normocephalic and atraumatic.  Right Ear: External ear normal.  Left Ear: External ear normal.  Nose: Nose normal.  Mouth/Throat: Oropharynx is clear and moist. No oropharyngeal exudate.  Eyes: Conjunctivae and EOM are normal. Pupils are equal, round, and reactive to light. Right eye exhibits no discharge. Left eye exhibits no discharge. No scleral icterus.  Neck: Normal range of motion. Neck supple. No JVD present. No tracheal deviation present. No thyromegaly present.  Cardiovascular: Normal rate, regular rhythm, normal heart sounds and intact distal pulses.  Exam reveals no gallop and no friction rub.   No murmur heard. Pulmonary/Chest: Effort normal and breath sounds normal. No stridor. No respiratory distress. He has no wheezes. He has no rales. He exhibits no tenderness.  Abdominal: Soft. Bowel sounds are normal. He exhibits no distension and no mass. There is no tenderness. There is no rebound and no guarding.  Genitourinary: Guaiac negative stool.  Musculoskeletal: He exhibits no edema.  Lymphadenopathy:    He has no cervical adenopathy.  Neurological: He is alert and oriented to person, place, and  time. He has normal reflexes. No cranial nerve deficit. He exhibits normal muscle tone. Coordination normal.  Skin: Skin is warm. No rash noted. He is not diaphoretic. No erythema. No pallor.  Psychiatric: He has a normal mood and affect. His behavior is normal. Judgment and thought content normal.  Nursing note and vitals reviewed.         Assessment & Plan:  Acute idiopathic gout, unspecified site - Plan: predniSONE (DELTASONE) 20 MG tablet, oxyCODONE (OXY IR/ROXICODONE) 5 MG immediate release tablet  Hospital discharge follow-up  CHF (congestive heart failure), NYHA class IV, unspecified failure chronicity, systolic (HCC)  Chronic kidney disease (CKD), stage IV (severe) ([place])  I will treat the patient's gout exacerbation with a prednisone taper pack. Obviously this will elevate his blood sugars however his options are limited because of his renal function. He can also use oxycodone 5 mg every 4-6 hours as needed. I want the patient to return immediately on Tuesday for repeat his BMP to monitor his renal function along with any evidence of hyperkalemia or acidosis. At the present time I see no evidence of any postoperative complication. There is no evidence of DVT or cellulitis around the operative site. He does have some mild shortness of breath without believe is due to congestive heart failure however I see no indication to require dialysis or increased diuresis at present.  After acute exacerbation has resolved, I would like to discuss with the patient possibly starting Uloric to help prevent gout flares.

## 2015-01-21 LAB — BASIC METABOLIC PANEL
BUN: 78 mg/dL — ABNORMAL HIGH (ref 7–25)
CALCIUM: 8.3 mg/dL — AB (ref 8.6–10.3)
CHLORIDE: 98 mmol/L (ref 98–110)
CO2: 28 mmol/L (ref 20–31)
CREATININE: 4.81 mg/dL — AB (ref 0.70–1.18)
Glucose, Bld: 76 mg/dL (ref 70–99)
Potassium: 3.2 mmol/L — ABNORMAL LOW (ref 3.5–5.3)
SODIUM: 139 mmol/L (ref 135–146)

## 2015-01-21 LAB — CBC WITH DIFFERENTIAL/PLATELET
BASOS ABS: 0.1 10*3/uL (ref 0.0–0.1)
BASOS PCT: 1 % (ref 0–1)
EOS ABS: 0.5 10*3/uL (ref 0.0–0.7)
EOS PCT: 6 % — AB (ref 0–5)
HCT: 29.6 % — ABNORMAL LOW (ref 39.0–52.0)
Hemoglobin: 9.7 g/dL — ABNORMAL LOW (ref 13.0–17.0)
LYMPHS ABS: 1.1 10*3/uL (ref 0.7–4.0)
Lymphocytes Relative: 13 % (ref 12–46)
MCH: 30.1 pg (ref 26.0–34.0)
MCHC: 32.8 g/dL (ref 30.0–36.0)
MCV: 91.9 fL (ref 78.0–100.0)
MPV: 8.9 fL (ref 8.6–12.4)
Monocytes Absolute: 0.8 10*3/uL (ref 0.1–1.0)
Monocytes Relative: 10 % (ref 3–12)
Neutro Abs: 5.9 10*3/uL (ref 1.7–7.7)
Neutrophils Relative %: 70 % (ref 43–77)
PLATELETS: 331 10*3/uL (ref 150–400)
RBC: 3.22 MIL/uL — ABNORMAL LOW (ref 4.22–5.81)
RDW: 15.3 % (ref 11.5–15.5)
WBC: 8.4 10*3/uL (ref 4.0–10.5)

## 2015-01-27 ENCOUNTER — Telehealth: Payer: Self-pay | Admitting: Cardiovascular Disease

## 2015-01-27 NOTE — Telephone Encounter (Signed)
Pt is having difficulty breathing when he lays down to go to sleep, he can't lay down he has to sit in a chair.

## 2015-01-27 NOTE — Telephone Encounter (Signed)
Called pt and complains of difficulty breathing when laying down for that past week. He uses 3 pillows to prop himself up while in the bed, but it doesn't last long as he gets better relief by going to sleep sitting up in his recliner. He states that he has been taking his lasix 100 mg bid.His weight in 12/30 at a Dr. Visit was 196lbs. Today it is 198 lbs.  He has no swelling on his feet, but did state his left leg/knee is swollen. He claimed it feels like it does when he has a bad chest cold, but cannot cough anything up. Please advise.

## 2015-01-27 NOTE — Telephone Encounter (Signed)
He is taking torsemide 100 mg twice daily, he stated he was mistaken earlier. He will take metolazone 2.5 mg daily for the next 3 days to see if it helps. He did not want to hear anything about going to see his nephrologist.

## 2015-01-27 NOTE — Telephone Encounter (Signed)
Discharge summary mentions torsemide (not Lasix) 100 mg bid. Which one is he taking? He has severe renal disease and will likely need dialysis. This is contributing to his fluid retention. The discharge summary states he is not even considering AV fistula placement.  Can try metolazone 2.5 mg daily x 3 days to see if this helps to alleviate symptoms, but he should go see nephrology as ultimately dialysis will be required to manage volume status.

## 2015-01-28 ENCOUNTER — Inpatient Hospital Stay (HOSPITAL_COMMUNITY)
Admission: EM | Admit: 2015-01-28 | Discharge: 2015-01-29 | DRG: 291 | Disposition: A | Discharge status: Home / Self Care | Payer: Medicare Other | Attending: Emergency Medicine | Admitting: Emergency Medicine

## 2015-01-28 ENCOUNTER — Encounter (HOSPITAL_COMMUNITY): Payer: Self-pay | Admitting: *Deleted

## 2015-01-28 ENCOUNTER — Emergency Department (HOSPITAL_COMMUNITY): Payer: Medicare Other

## 2015-01-28 DIAGNOSIS — N186 End stage renal disease: Secondary | ICD-10-CM | POA: Diagnosis not present

## 2015-01-28 DIAGNOSIS — R778 Other specified abnormalities of plasma proteins: Secondary | ICD-10-CM

## 2015-01-28 DIAGNOSIS — I5023 Acute on chronic systolic (congestive) heart failure: Secondary | ICD-10-CM | POA: Diagnosis present

## 2015-01-28 DIAGNOSIS — E1122 Type 2 diabetes mellitus with diabetic chronic kidney disease: Secondary | ICD-10-CM | POA: Diagnosis not present

## 2015-01-28 DIAGNOSIS — Z794 Long term (current) use of insulin: Secondary | ICD-10-CM

## 2015-01-28 DIAGNOSIS — Z95810 Presence of automatic (implantable) cardiac defibrillator: Secondary | ICD-10-CM

## 2015-01-28 DIAGNOSIS — D631 Anemia in chronic kidney disease: Secondary | ICD-10-CM

## 2015-01-28 DIAGNOSIS — R7989 Other specified abnormal findings of blood chemistry: Secondary | ICD-10-CM

## 2015-01-28 DIAGNOSIS — R748 Abnormal levels of other serum enzymes: Secondary | ICD-10-CM | POA: Diagnosis not present

## 2015-01-28 DIAGNOSIS — R0600 Dyspnea, unspecified: Secondary | ICD-10-CM | POA: Diagnosis not present

## 2015-01-28 DIAGNOSIS — I132 Hypertensive heart and chronic kidney disease with heart failure and with stage 5 chronic kidney disease, or end stage renal disease: Secondary | ICD-10-CM | POA: Diagnosis not present

## 2015-01-28 DIAGNOSIS — M109 Gout, unspecified: Secondary | ICD-10-CM | POA: Diagnosis not present

## 2015-01-28 DIAGNOSIS — E785 Hyperlipidemia, unspecified: Secondary | ICD-10-CM | POA: Diagnosis not present

## 2015-01-28 DIAGNOSIS — I429 Cardiomyopathy, unspecified: Secondary | ICD-10-CM | POA: Diagnosis present

## 2015-01-28 DIAGNOSIS — E1121 Type 2 diabetes mellitus with diabetic nephropathy: Secondary | ICD-10-CM | POA: Diagnosis present

## 2015-01-28 DIAGNOSIS — G4730 Sleep apnea, unspecified: Secondary | ICD-10-CM | POA: Diagnosis not present

## 2015-01-28 DIAGNOSIS — N184 Chronic kidney disease, stage 4 (severe): Secondary | ICD-10-CM | POA: Diagnosis not present

## 2015-01-28 DIAGNOSIS — I130 Hypertensive heart and chronic kidney disease with heart failure and stage 1 through stage 4 chronic kidney disease, or unspecified chronic kidney disease: Principal | ICD-10-CM | POA: Diagnosis present

## 2015-01-28 DIAGNOSIS — I352 Nonrheumatic aortic (valve) stenosis with insufficiency: Secondary | ICD-10-CM | POA: Diagnosis not present

## 2015-01-28 DIAGNOSIS — E11319 Type 2 diabetes mellitus with unspecified diabetic retinopathy without macular edema: Secondary | ICD-10-CM | POA: Diagnosis present

## 2015-01-28 DIAGNOSIS — R0602 Shortness of breath: Secondary | ICD-10-CM | POA: Diagnosis not present

## 2015-01-28 DIAGNOSIS — Z7982 Long term (current) use of aspirin: Secondary | ICD-10-CM | POA: Diagnosis not present

## 2015-01-28 DIAGNOSIS — I509 Heart failure, unspecified: Secondary | ICD-10-CM

## 2015-01-28 DIAGNOSIS — Z79899 Other long term (current) drug therapy: Secondary | ICD-10-CM | POA: Diagnosis not present

## 2015-01-28 DIAGNOSIS — F17210 Nicotine dependence, cigarettes, uncomplicated: Secondary | ICD-10-CM | POA: Diagnosis present

## 2015-01-28 LAB — BASIC METABOLIC PANEL
ANION GAP: 14 (ref 5–15)
BUN: 85 mg/dL — ABNORMAL HIGH (ref 6–20)
CALCIUM: 9.3 mg/dL (ref 8.9–10.3)
CHLORIDE: 99 mmol/L — AB (ref 101–111)
CO2: 26 mmol/L (ref 22–32)
Creatinine, Ser: 5.53 mg/dL — ABNORMAL HIGH (ref 0.61–1.24)
GFR calc Af Amer: 11 mL/min — ABNORMAL LOW (ref >60)
GFR calc non Af Amer: 9 mL/min — ABNORMAL LOW (ref >60)
GLUCOSE: 152 mg/dL — AB (ref 65–99)
POTASSIUM: 3.9 mmol/L (ref 3.5–5.1)
Sodium: 139 mmol/L (ref 135–145)

## 2015-01-28 LAB — CBC
HEMATOCRIT: 32.6 % — AB (ref 39.0–52.0)
HEMOGLOBIN: 10.7 g/dL — AB (ref 13.0–17.0)
MCH: 29.6 pg (ref 26.0–34.0)
MCHC: 32.8 g/dL (ref 30.0–36.0)
MCV: 90.3 fL (ref 78.0–100.0)
Platelets: 262 10*3/uL (ref 150–400)
RBC: 3.61 MIL/uL — ABNORMAL LOW (ref 4.22–5.81)
RDW: 15.1 % (ref 11.5–15.5)
WBC: 7.3 10*3/uL (ref 4.0–10.5)

## 2015-01-28 LAB — TROPONIN I: Troponin I: 0.09 ng/mL — ABNORMAL HIGH (ref <0.031)

## 2015-01-28 LAB — BRAIN NATRIURETIC PEPTIDE: B Natriuretic Peptide: >4500 pg/mL — ABNORMAL HIGH (ref 0.0–100.0)

## 2015-01-28 MED ORDER — ALBUTEROL SULFATE (2.5 MG/3ML) 0.083% IN NEBU
5.0000 mg | INHALATION_SOLUTION | Freq: Once | RESPIRATORY_TRACT | Status: AC
  Administered 2015-01-28: 5 mg via RESPIRATORY_TRACT
  Filled 2015-01-28: qty 6

## 2015-01-28 NOTE — ED Notes (Signed)
Pt with SOB and pt states that he felt scared, tightness in chest across chest mild

## 2015-01-29 ENCOUNTER — Ambulatory Visit (INDEPENDENT_AMBULATORY_CARE_PROVIDER_SITE_OTHER): Payer: Medicare Other | Admitting: Physician Assistant

## 2015-01-29 ENCOUNTER — Encounter: Payer: Self-pay | Admitting: Physician Assistant

## 2015-01-29 VITALS — BP 142/60 | HR 76 | Ht 69.0 in | Wt 199.0 lb

## 2015-01-29 DIAGNOSIS — R0600 Dyspnea, unspecified: Secondary | ICD-10-CM | POA: Diagnosis present

## 2015-01-29 DIAGNOSIS — I5023 Acute on chronic systolic (congestive) heart failure: Secondary | ICD-10-CM | POA: Diagnosis present

## 2015-01-29 DIAGNOSIS — Z79899 Other long term (current) drug therapy: Secondary | ICD-10-CM | POA: Diagnosis not present

## 2015-01-29 DIAGNOSIS — N185 Chronic kidney disease, stage 5: Secondary | ICD-10-CM | POA: Diagnosis not present

## 2015-01-29 DIAGNOSIS — Z7982 Long term (current) use of aspirin: Secondary | ICD-10-CM | POA: Diagnosis not present

## 2015-01-29 DIAGNOSIS — I5043 Acute on chronic combined systolic (congestive) and diastolic (congestive) heart failure: Secondary | ICD-10-CM | POA: Diagnosis not present

## 2015-01-29 DIAGNOSIS — I352 Nonrheumatic aortic (valve) stenosis with insufficiency: Secondary | ICD-10-CM | POA: Diagnosis present

## 2015-01-29 DIAGNOSIS — M109 Gout, unspecified: Secondary | ICD-10-CM | POA: Diagnosis present

## 2015-01-29 DIAGNOSIS — Z95810 Presence of automatic (implantable) cardiac defibrillator: Secondary | ICD-10-CM

## 2015-01-29 DIAGNOSIS — N184 Chronic kidney disease, stage 4 (severe): Secondary | ICD-10-CM | POA: Diagnosis present

## 2015-01-29 DIAGNOSIS — F17210 Nicotine dependence, cigarettes, uncomplicated: Secondary | ICD-10-CM | POA: Diagnosis present

## 2015-01-29 DIAGNOSIS — I130 Hypertensive heart and chronic kidney disease with heart failure and stage 1 through stage 4 chronic kidney disease, or unspecified chronic kidney disease: Secondary | ICD-10-CM | POA: Diagnosis present

## 2015-01-29 DIAGNOSIS — E1121 Type 2 diabetes mellitus with diabetic nephropathy: Secondary | ICD-10-CM | POA: Diagnosis present

## 2015-01-29 DIAGNOSIS — G4730 Sleep apnea, unspecified: Secondary | ICD-10-CM | POA: Diagnosis present

## 2015-01-29 DIAGNOSIS — E785 Hyperlipidemia, unspecified: Secondary | ICD-10-CM | POA: Diagnosis present

## 2015-01-29 DIAGNOSIS — E1122 Type 2 diabetes mellitus with diabetic chronic kidney disease: Secondary | ICD-10-CM | POA: Diagnosis present

## 2015-01-29 DIAGNOSIS — Z794 Long term (current) use of insulin: Secondary | ICD-10-CM | POA: Diagnosis not present

## 2015-01-29 DIAGNOSIS — I429 Cardiomyopathy, unspecified: Secondary | ICD-10-CM | POA: Diagnosis present

## 2015-01-29 DIAGNOSIS — I509 Heart failure, unspecified: Secondary | ICD-10-CM

## 2015-01-29 DIAGNOSIS — E11319 Type 2 diabetes mellitus with unspecified diabetic retinopathy without macular edema: Secondary | ICD-10-CM | POA: Diagnosis present

## 2015-01-29 MED ORDER — FUROSEMIDE 10 MG/ML IJ SOLN
INTRAMUSCULAR | Status: AC
  Filled 2015-01-29: qty 20

## 2015-01-29 MED ORDER — DEXTROSE 5 % IV SOLN
200.0000 mg | Freq: Once | INTRAVENOUS | Status: AC
  Administered 2015-01-29: 200 mg via INTRAVENOUS
  Filled 2015-01-29: qty 20

## 2015-01-29 NOTE — Discharge Instructions (Signed)
Continue to weigh yourself every day, and take your Torsemide as prescribed. Use the metalozone as needed to keep your weight at the right level.  Heart Failure Heart failure is a condition in which the heart has trouble pumping blood. This means your heart does not pump blood efficiently for your body to work well. In some cases of heart failure, fluid may back up into your lungs or you may have swelling (edema) in your lower legs. Heart failure is usually a long-term (chronic) condition. It is important for you to take good care of yourself and follow your health care provider's treatment plan. CAUSES  Some health conditions can cause heart failure. Those health conditions include:  High blood pressure (hypertension). Hypertension causes the heart muscle to work harder than normal. When pressure in the blood vessels is high, the heart needs to pump (contract) with more force in order to circulate blood throughout the body. High blood pressure eventually causes the heart to become stiff and weak.  Coronary artery disease (CAD). CAD is the buildup of cholesterol and fat (plaque) in the arteries of the heart. The blockage in the arteries deprives the heart muscle of oxygen and blood. This can cause chest pain and may lead to a heart attack. High blood pressure can also contribute to CAD.  Heart attack (myocardial infarction). A heart attack occurs when one or more arteries in the heart become blocked. The loss of oxygen damages the muscle tissue of the heart. When this happens, part of the heart muscle dies. The injured tissue does not contract as well and weakens the heart's ability to pump blood.  Abnormal heart valves. When the heart valves do not open and close properly, it can cause heart failure. This makes the heart muscle pump harder to keep the blood flowing.  Heart muscle disease (cardiomyopathy or myocarditis). Heart muscle disease is damage to the heart muscle from a variety of causes.  These can include drug or alcohol abuse, infections, or unknown reasons. These can increase the risk of heart failure.  Lung disease. Lung disease makes the heart work harder because the lungs do not work properly. This can cause a strain on the heart, leading it to fail.  Diabetes. Diabetes increases the risk of heart failure. High blood sugar contributes to high fat (lipid) levels in the blood. Diabetes can also cause slow damage to tiny blood vessels that carry important nutrients to the heart muscle. When the heart does not get enough oxygen and food, it can cause the heart to become weak and stiff. This leads to a heart that does not contract efficiently.  Other conditions can contribute to heart failure. These include abnormal heart rhythms, thyroid problems, and low blood counts (anemia). Certain unhealthy behaviors can increase the risk of heart failure, including:  Being overweight.  Smoking or chewing tobacco.  Eating foods high in fat and cholesterol.  Abusing illicit drugs or alcohol.  Lacking physical activity. SYMPTOMS  Heart failure symptoms may vary and can be hard to detect. Symptoms may include:  Shortness of breath with activity, such as climbing stairs.  Persistent cough.  Swelling of the feet, ankles, legs, or abdomen.  Unexplained weight gain.  Difficulty breathing when lying flat (orthopnea).  Waking from sleep because of the need to sit up and get more air.  Rapid heartbeat.  Fatigue and loss of energy.  Feeling light-headed, dizzy, or close to fainting.  Loss of appetite.  Nausea.  Increased urination during the night (nocturia).  DIAGNOSIS  A diagnosis of heart failure is based on your history, symptoms, physical examination, and diagnostic tests. Diagnostic tests for heart failure may include:  Echocardiography.  Electrocardiography.  Chest X-ray.  Blood tests.  Exercise stress test.  Cardiac angiography.  Radionuclide  scans. TREATMENT  Treatment is aimed at managing the symptoms of heart failure. Medicines, behavioral changes, or surgical intervention may be necessary to treat heart failure.  Medicines to help treat heart failure may include:  Angiotensin-converting enzyme (ACE) inhibitors. This type of medicine blocks the effects of a blood protein called angiotensin-converting enzyme. ACE inhibitors relax (dilate) the blood vessels and help lower blood pressure.  Angiotensin receptor blockers (ARBs). This type of medicine blocks the actions of a blood protein called angiotensin. Angiotensin receptor blockers dilate the blood vessels and help lower blood pressure.  Water pills (diuretics). Diuretics cause the kidneys to remove salt and water from the blood. The extra fluid is removed through urination. This loss of extra fluid lowers the volume of blood the heart pumps.  Beta blockers. These prevent the heart from beating too fast and improve heart muscle strength.  Digitalis. This increases the force of the heartbeat.  Healthy behavior changes include:  Obtaining and maintaining a healthy weight.  Stopping smoking or chewing tobacco.  Eating heart-healthy foods.  Limiting or avoiding alcohol.  Stopping illicit drug use.  Physical activity as directed by your health care provider.  Surgical treatment for heart failure may include:  A procedure to open blocked arteries, repair damaged heart valves, or remove damaged heart muscle tissue.  A pacemaker to improve heart muscle function and control certain abnormal heart rhythms.  An internal cardioverter defibrillator to treat certain serious abnormal heart rhythms.  A left ventricular assist device (LVAD) to assist the pumping ability of the heart. HOME CARE INSTRUCTIONS   Take medicines only as directed by your health care provider. Medicines are important in reducing the workload of your heart, slowing the progression of heart failure, and  improving your symptoms.  Do not stop taking your medicine unless directed by your health care provider.  Do not skip any dose of medicine.  Refill your prescriptions before you run out of medicine. Your medicines are needed every day.  Engage in moderate physical activity if directed by your health care provider. Moderate physical activity can benefit some people. The elderly and people with severe heart failure should consult with a health care provider for physical activity recommendations.  Eat heart-healthy foods. Food choices should be free of trans fat and low in saturated fat, cholesterol, and salt (sodium). Healthy choices include fresh or frozen fruits and vegetables, fish, lean meats, legumes, fat-free or low-fat dairy products, and whole grain or high fiber foods. Talk to a dietitian to learn more about heart-healthy foods.  Limit sodium if directed by your health care provider. Sodium restriction may reduce symptoms of heart failure in some people. Talk to a dietitian to learn more about heart-healthy seasonings.  Use healthy cooking methods. Healthy cooking methods include roasting, grilling, broiling, baking, poaching, steaming, or stir-frying. Talk to a dietitian to learn more about healthy cooking methods.  Limit fluids if directed by your health care provider. Fluid restriction may reduce symptoms of heart failure in some people.  Weigh yourself every day. Daily weights are important in the early recognition of excess fluid. You should weigh yourself every morning after you urinate and before you eat breakfast. Wear the same amount of clothing each time you weigh yourself.  Record your daily weight. Provide your health care provider with your weight record.  Monitor and record your blood pressure if directed by your health care provider.  Check your pulse if directed by your health care provider.  Lose weight if directed by your health care provider. Weight loss may reduce  symptoms of heart failure in some people.  Stop smoking or chewing tobacco. Nicotine makes your heart work harder by causing your blood vessels to constrict. Do not use nicotine gum or patches before talking to your health care provider.  Keep all follow-up visits as directed by your health care provider. This is important.  Limit alcohol intake to no more than 1 drink per day for nonpregnant women and 2 drinks per day for men. One drink equals 12 ounces of beer, 5 ounces of wine, or 1 ounces of hard liquor. Drinking more than that is harmful to your heart. Tell your health care provider if you drink alcohol several times a week. Talk with your health care provider about whether alcohol is safe for you. If your heart has already been damaged by alcohol or you have severe heart failure, drinking alcohol should be stopped completely.  Stop illicit drug use.  Stay up-to-date with immunizations. It is especially important to prevent respiratory infections through current pneumococcal and influenza immunizations.  Manage other health conditions such as hypertension, diabetes, thyroid disease, or abnormal heart rhythms as directed by your health care provider.  Learn to manage stress.  Plan rest periods when fatigued.  Learn strategies to manage high temperatures. If the weather is extremely hot:  Avoid vigorous physical activity.  Use air conditioning or fans or seek a cooler location.  Avoid caffeine and alcohol.  Wear loose-fitting, lightweight, and light-colored clothing.  Learn strategies to manage cold temperatures. If the weather is extremely cold:  Avoid vigorous physical activity.  Layer clothes.  Wear mittens or gloves, a hat, and a scarf when going outside.  Avoid alcohol.  Obtain ongoing education and support as needed.  Participate in or seek rehabilitation as needed to maintain or improve independence and quality of life. SEEK MEDICAL CARE IF:   You have a rapid  weight gain.  You have increasing shortness of breath that is unusual for you.  You are unable to participate in your usual physical activities.  You tire easily.  You cough more than normal, especially with physical activity.  You have any or more swelling in areas such as your hands, feet, ankles, or abdomen.  You are unable to sleep because it is hard to breathe.  You feel like your heart is beating fast (palpitations).  You become dizzy or light-headed upon standing up. SEEK IMMEDIATE MEDICAL CARE IF:   You have difficulty breathing.  There is a change in mental status such as decreased alertness or difficulty with concentration.  You have a pain or discomfort in your chest.  You have an episode of fainting (syncope). MAKE SURE YOU:   Understand these instructions.  Will watch your condition.  Will get help right away if you are not doing well or get worse.   This information is not intended to replace advice given to you by your health care provider. Make sure you discuss any questions you have with your health care provider.   Document Released: 01/03/2005 Document Revised: 05/20/2014 Document Reviewed: 02/03/2012 Elsevier Interactive Patient Education 2016 [place] Kidney Disease The kidneys are two organs that lie on either side of the spine  between the middle of the back and the front of the abdomen. The kidneys:   Remove wastes and extra water from the blood.   Produce important hormones. These help keep bones strong, regulate blood pressure, and help create red blood cells.   Balance the fluids and chemicals in the blood and tissues. End-stage kidney disease occurs when the kidneys are so damaged that they cannot do their job. When the kidneys cannot do their job, life-threatening problems occur. The body cannot stay clean and strong without the help of the kidneys. In end-stage kidney disease, the kidneys cannot get better.You need a new  kidney or treatments to do some of the work healthy kidneys do in order to stay alive. CAUSES  End-stage kidney disease usually occurs when a long-lasting (chronic) kidney disease gets worse. It may also occur after the kidneys are suddenly damaged (acute kidney injury).  SYMPTOMS   Swelling (edema) of the legs, ankles, or feet.   Tiredness (lethargy).   Nausea or vomiting.   Confusion.   Problems with urination, such as:   Decreased urine production.   Frequent urination, especially at night.   Frequent accidents in children who are potty trained.   Muscle twitches and cramps.   Persistent itchiness.   Loss of appetite.   Headaches.   Abnormally dark or light skin.   Numbness in the hands or feet.   Easy bruising.   Frequent hiccups.   Menstruation stops. DIAGNOSIS  Your health care provider will measure your blood pressure and take some tests. These may include:   Urine tests.   Blood tests.   Imaging tests, such as:   An ultrasound exam.   Computed tomography (CT).  A kidney biopsy. TREATMENT  There are two treatments for end-stage kidney disease:   A procedure that removes toxic wastes from the body (dialysis).   Receiving a new kidney (kidney transplant). Both of these treatments have serious risks and consequences. Your health care provider will help you determine which treatment is best for you based on your health, age, and other factors. In addition to having dialysis or a kidney transplant, you may need to take medicines to control high blood pressure (hypertension) and cholesterol and to decrease phosphorus levels in your blood.  HOME CARE INSTRUCTIONS  Follow your prescribed diet.   Take medicines only as directed by your health care provider.   Do not take any new medicines (prescription, over-the-counter, or nutritional supplements) unless approved by your health care provider. Many medicines can worsen your kidney  damage or need to have the dose adjusted.   Keep all follow-up visits as directed by your health care provider. MAKE SURE YOU:  Understand these instructions.  Will watch your condition.  Will get help right away if you are not doing well or get worse.   This information is not intended to replace advice given to you by your health care provider. Make sure you discuss any questions you have with your health care provider.   Document Released: 03/26/2003 Document Revised: 01/24/2014 Document Reviewed: 09/02/2011 Elsevier Interactive Patient Education [employer].

## 2015-01-29 NOTE — Patient Instructions (Signed)
Your physician wants you to follow-up in: 3 Months with Dr. Bronson Ing.  You will receive a reminder letter in the mail two months in advance. If you don't receive a letter, please call our office to schedule the follow-up appointment.  Your physician recommends that you continue on your current medications as directed. Please refer to the Current Medication list given to you today.  Keep Appointment with Dr. Mercy Moore   If you need a refill on your cardiac medications before your next appointment, please call your pharmacy.  Thank you for choosing [facility]!  Dialysis Diet Dialysis is a treatment that you may undergo if you have significant damage to your kidneys. Dialysis replaces some of the work that the kidneys do. One of the jobs that it takes over is removing wastes, salt, and extra water from your blood. This helps to keep the amount of potassium and other nutrients in your blood at healthy levels. When you need dialysis, it is important to pay careful attention to your diet. Between dialysis sessions, certain nutrients can build up in your blood and cause you to get sick. Vitamins and minerals are an important part of a healthy diet and should not be avoided entirely. However, it is commonly recommended that you limit your intake of potassium, phosphorus, and sodium. It may also be necessary to restrict other nutrients, such as carbohydrates or fat, if you have other health conditions. Your health care provider or dietitian can help you to determine the amount of these nutrients that is right for you. WHAT IS MY PLAN? Your dietitian will help you to design a meal plan that is specific to your needs. Generally, meal plans include:  Grains, 6-11 servings per day. One serving is equal to 1 slice of bread or  cup of cooked rice or pasta.  Low-potassium vegetables, 2-3 servings per day. One serving is equal to  cup.  Low-potassium fruits, 2-3 servings per day. One serving is  equal to  cup.  Meats and other protein sources, 8-11 oz per day.  Dairy,  cup per day. Your dietitian will provide you with specific instructions about the amount of fluids you can have each day. WHAT DO I NEED TO KNOW ABOUT A DIALYSIS DIET?  Limit your intake of potassium. Potassium is found in milk, fruits, and vegetables.  Limit your intake of phosphorus. Phosphorus is found in milk, cheese, beans, nuts, and carbonated beverages. Avoid whole-grain and high-fiber foods because they contain high amounts of phosphorus.  Limit your intake of sodium. Foods that are high in sodium include processed and cured meats, ready-made frozen meals, canned vegetables, and salty snack foods. Do not use salt substitutes because they contain potassium.  If you were instructed to restrict your fluid intake, follow your health care provider's specific instructions. You may be told to:  Write down what you drink and any foods you eat that are made mostly from water, such as gelatin and soups.  Drink from small cups to help control how much you drink.  Ask your health care provider if you should regularly take an over-the-counter medicine that binds phosphorus, such as antacid products that contain calcium carbonate.  Take vitamin and mineral supplements only as directed by your health care provider.  Eat high-quality proteins, such as meat, poultry, fish, and eggs. Limit low-quality plant-based proteins, such as nuts and beans.  Cut potatoes into small pieces and boil them in unsalted water before you eat them. This can help to remove  some potassium from the potato.  Drain all fluid from cooked vegetables and canned fruits before eating them. WHAT FOODS CAN I EAT? Grains White bread. White rice. Cooked cereal. Unsalted popcorn. Tortillas. Pasta. Vegetables Fresh or frozen broccoli, carrots, and green beans. Cabbage. Cauliflower. Celery. Cucumbers. Eggplant. Radishes. Zucchini. Fruits Apples. Fresh  or frozen berries. Fresh or canned pears, peaches, and pineapple. Grapes. Plums. Meats and Other Protein Sources Fresh or frozen beef, pork, chicken, and fish. Eggs. Low-sodium canned tuna or salmon. Dairy Cream cheese. Heavy cream. Ricotta cheese. Beverages Apple cider. Cranberry juice. Grape juice. Lemonade. Black coffee. Condiments Herbs. Spices. Jam and jelly. Honey. Sweets and Desserts Sherbet. Cakes. Cookies. Fats and Oils Olive oil, canola oil, and safflower oil. Other Non-dairy creamer. Non-dairy whipped topping. Homemade broth without salt. The items listed above may not be a complete list of recommended foods or beverages. Contact your dietitian for more options.  WHAT FOODS ARE NOT RECOMMENDED? Grains Whole-grain bread. Whole-grain pasta. High-fiber cereal. Vegetables Potatoes. Beets. Tomatoes. Winter squash and pumpkin. Asparagus. Spinach. Parsnips. Fruits Star fruit. Bananas. Oranges. Kiwi. Nectarines. Prunes. Melon. Dried fruit. Avocado. Meats and Other Protein Sources Canned, smoked, and cured meats. Soil scientist. Sardines. Nuts and seeds. Peanut butter. Beans and legumes. Dairy Milk. Buttermilk. Yogurt. Cheese and cottage cheese. Processed cheese spreads. Beverages Orange juice. Prune juice. Carbonated soft drinks. Condiments Salt. Salt substitutes. Soy sauce. Sweets and Desserts Ice cream. Chocolate. Candied nuts. Fats and Oils Butter. Margarine. Other Ready-made frozen meals. Canned soups. The items listed above may not be a complete list of foods and beverages to avoid. Contact your dietitian for more information.   This information is not intended to replace advice given to you by your health care provider. Make sure you discuss any questions you have with your health care provider.   Document Released: 10/01/2003 Document Revised: 01/24/2014 Document Reviewed: 08/06/2013 Elsevier Interactive Patient Education [employer].

## 2015-01-29 NOTE — ED Notes (Signed)
AC aware of Lasix drip.

## 2015-01-29 NOTE — ED Provider Notes (Signed)
CSN:  [id number]     Arrival date & time 01/28/15  2118 History   First MD Initiated Contact with Patient 01/28/15 2237     Chief Complaint  Patient presents with  . Shortness of Breath     (Consider location/radiation/quality/duration/timing/severity/associated sxs/prior Treatment) The history is provided by the patient and the spouse.   [name] is a 70 y.o. male with a past medical history outlined below, most significant for CHF with an ejection fraction of 20-25%, nonischemic cardiomyopathy, and stage IV kidney disease presenting with a 2 day history of increasing shortness of breath and increased ankle edema.  He developed mild tightness across his chest but denies chest pain which has been present since yesterday.  His sob is exertional.  He states he has gained 10 lbs since he last weighed 5 days ago, therefore took a metolazone today (uses prn) in addition to his demadex which he takes daily and states has not missed any doses.   He denies fevers, chills, nausea, vomiting or other complaints.  He does endorse generalized fatigue.   Past Medical History  Diagnosis Date  . Chronic combined systolic and diastolic CHF (congestive heart failure) ([place])     a. 02/2014 Echo: EF 20-25% (new), Gr 2 DD, mod conc LVH, mild AI/AS, mod MR, sev dil LA, mild TR.  Marland Kitchen Nonischemic cardiomyopathy ([place])     a. 02/2014 Echo: EF 20-25%;  b. 03/2014 Cath: LM nl, LAD min irregs, LCX   . Hypertension   . Chronic kidney disease (CKD), stage IV (severe) ([place]) 12/2009    a. baseline creat now ~ 4.  . Erectile dysfunction   . Colonic polyp   . Low serum testosterone level   . Tobacco abuse   . Benign prostatic hypertrophy   . Obesity, Class I, BMI 30-34.9   . Hyperlipidemia   . Adenomatous colon polyp 2006  . H. pylori infection 2013    treated with prevpac  . Mild Ao Stenosis and Insufficiency   . AICD (automatic cardioverter/defibrillator) present     STJ device, implanted 12/03/14 Dr. Lovena Le  .  Sleep apnea 2010    Initially unable to afford CPAP  . 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 Gout    Past Surgical History  Procedure Laterality Date  . Retinal laser procedure      diabetic retinopathy  . Lymph node biopsy      surgical exploration of neck-not entirely clear that this represented a lymph node biopsy  . Colonoscopy  08/23/2004    Dr. Vivi Ferns rectum, diminutive polyp of the rectosigmoid removed, inflamed focally adenomatous polyp  . Left and right heart catheterization with coronary angiogram N/A 03/19/2014    Procedure: LEFT AND RIGHT HEART CATHETERIZATION WITH CORONARY ANGIOGRAM;  Surgeon: Blane Ohara, MD;  Location: [facility] CATH LAB;  Service: Cardiovascular;  Laterality: N/A;  . Insertion of icd  12/03/2014  . Cardiac catheterization    . Ep implantable device N/A 12/03/2014    Procedure: ICD Implant;  Surgeon: Evans Lance, MD;  Location: [place] CV LAB;  Service: Cardiovascular;  Laterality: N/A;  . Esophagogastroduodenoscopy  2013    Dr. Gala Romney: erosions reminiscent of GAVE but biopsies showed H.pylori gastritis  . Colonoscopy  2013    Dr. Gala Romney: multiple diminutive polyps in distal sigmoid segment, 4 mm pedunculated polyp at hepatic flexure. Tubular adenomas. Surveillance due 2018   . Cholecystectomy N/A 01/07/2015  Procedure: LAPAROSCOPIC CHOLECYSTECTOMY WITH INTRAOPERATIVE CHOLANGIOGRAM;  Surgeon: Donnie Mesa, MD;  Location: [place];  Service: General;  Laterality: N/A;  . Umbilical hernia repair N/A 01/07/2015    Procedure: HERNIA REPAIR UMBILICAL ADULT;  Surgeon: Donnie Mesa, MD;  Location: [place];  Service: General;  Laterality: N/A;  . Ercp N/A 01/09/2015    Procedure: ENDOSCOPIC RETROGRADE CHOLANGIOPANCREATOGRAPHY (ERCP);  Surgeon: Clarene Essex, MD;  Location: [facility] ENDOSCOPY;  Service: Endoscopy;  Laterality: N/A;   Family History  Problem Relation Age of Onset  . Hypertension Mother   . Cancer Mother   . Cancer Father    . Diabetes Sister    Social History  Substance Use Topics  . Smoking status: Current Every Day Smoker -- 0.50 packs/day for 56 years    Types: Cigarettes    Start date: 05/31/1958  . Smokeless tobacco: Never Used  . Alcohol Use: No    Review of Systems  Constitutional: Positive for fatigue. Negative for fever.  HENT: Negative for congestion and sore throat.   Eyes: Negative.   Respiratory: Positive for shortness of breath and wheezing. Negative for chest tightness.   Cardiovascular: Positive for leg swelling. Negative for chest pain.  Gastrointestinal: Negative for nausea, vomiting and abdominal pain.  Genitourinary: Negative.   Musculoskeletal: Negative for joint swelling, arthralgias and neck pain.  Skin: Negative.  Negative for rash and wound.  Neurological: Negative for dizziness, weakness, light-headedness, numbness and headaches.  Psychiatric/Behavioral: Negative.       Allergies  Ace inhibitors  Home Medications   Prior to Admission medications   Medication Sig Start Date End Date Taking? Authorizing Provider  albuterol (PROVENTIL HFA;VENTOLIN HFA) 108 (90 Base) MCG/ACT inhaler Inhale 2 puffs into the lungs every 6 (six) hours as needed for wheezing or shortness of breath. 01/16/15   Susy Frizzle, MD  aspirin EC 81 MG tablet Take 81 mg by mouth daily.    Historical Provider, MD  calcitRIOL (ROCALTROL) 0.25 MCG capsule Take 1 capsule (0.25 mcg total) by mouth daily. 01/11/15   Annita Brod, MD  calcium acetate (PHOSLO) 667 MG capsule Take 1 capsule (667 mg total) by mouth 3 (three) times daily with meals. 01/11/15   Annita Brod, MD  cloNIDine (CATAPRES) 0.3 MG tablet Take 1 tablet (0.3 mg total) by mouth daily. 01/08/15   Susy Frizzle, MD  hydrALAZINE (APRESOLINE) 100 MG tablet Take 1 tablet (100 mg total) by mouth 2 (two) times daily. 01/11/15   Annita Brod, MD  insulin glargine (LANTUS) 100 UNIT/ML injection Inject 0.15 mLs (15 Units total)  into the skin at bedtime. 01/11/15   Annita Brod, MD  metoprolol succinate (TOPROL-XL) 25 MG 24 hr tablet Take 1 tablet (25 mg total) by mouth daily. 01/08/15   Susy Frizzle, MD  ONE TOUCH ULTRA TEST test strip Check blood sugar each morning fasting and once daily 2 hours after a meal 12/10/14   Susy Frizzle, MD  oxyCODONE (OXY IR/ROXICODONE) 5 MG immediate release tablet Take 1-2 tablets (5-10 mg total) by mouth every 4 (four) hours as needed for moderate pain, severe pain or breakthrough pain. 01/16/15   Susy Frizzle, MD  predniSONE (DELTASONE) 20 MG tablet 3 tabs poqday 1-2, 2 tabs poqday 3-4, 1 tab poqday 5-6 01/16/15   Susy Frizzle, MD  torsemide (DEMADEX) 100 MG tablet Take 1 tablet (100 mg total) by mouth 2 (two) times daily. 01/08/15   Susy Frizzle, MD   BP  151/58 mmHg  Pulse 84  Temp(Src) 97.8 F (36.6 C) (Oral)  Resp 11  Ht 5\' 9"  (1.753 m)  Wt 91.173 kg  BMI 29.67 kg/m2  SpO2 96% Physical Exam  Constitutional: He appears well-developed and well-nourished.  HENT:  Head: Normocephalic and atraumatic.  Eyes: Conjunctivae are normal.  Neck: Normal range of motion.  Cardiovascular: Normal rate, regular rhythm and intact distal pulses.   Murmur heard.  Systolic murmur is present with a grade of 2/6  Bilateral ankle edema, non pitting.  Pulmonary/Chest: Effort normal. He has wheezes in the left lower field. He has rales in the right lower field and the left lower field.  Faint bilateral rales.  Expiratory wheeze left base only,  Actively receiving neb tx during exam.  Abdominal: Soft. Bowel sounds are normal. There is no tenderness.  Musculoskeletal: Normal range of motion.  Neurological: He is alert.  Skin: Skin is warm and dry.  Psychiatric: He has a normal mood and affect.  Nursing note and vitals reviewed.   ED Course  Procedures (including critical care time) Labs Review Labs Reviewed  TROPONIN I - Abnormal; Notable for the following:     Troponin I 0.09 (*)    All other components within normal limits  CBC - Abnormal; Notable for the following:    RBC 3.61 (*)    Hemoglobin 10.7 (*)    HCT 32.6 (*)    All other components within normal limits  BASIC METABOLIC PANEL - Abnormal; Notable for the following:    Chloride 99 (*)    Glucose, Bld 152 (*)    BUN 85 (*)    Creatinine, Ser 5.53 (*)    GFR calc non Af Amer 9 (*)    GFR calc Af Amer 11 (*)    All other components within normal limits  BRAIN NATRIURETIC PEPTIDE - Abnormal; Notable for the following:    B Natriuretic Peptide >4500.0 (*)    All other components within normal limits    Imaging Review Dg Chest Portable 1 View  01/28/2015  CLINICAL DATA:  Shortness of breath, wheezing and chest tightness today. EXAM: PORTABLE CHEST 1 VIEW COMPARISON:  12/04/2014 FINDINGS: Single lead left-sided pacemaker remains in place. Cardiomegaly, possibly progressed from prior. Tortuosity of the thoracic aorta again seen. Bilateral perihilar alveolar opacities concerning for pulmonary edema. No large pleural effusion. No confluent airspace disease. No pneumothorax. IMPRESSION: Pulmonary edema. Cardiomegaly, mildly progressed from prior. Findings most consistent with CHF. Electronically Signed   By: Jeb Levering M.D.   On: 01/28/2015 22:01   I have personally reviewed and evaluated these images and lab results as part of my medical decision-making.   EKG Interpretation   Date/Time:  Wednesday January 28 2015 21:27:09 EST Ventricular Rate:  94 PR Interval:  167 QRS Duration: 126 QT Interval:  386 QTC Calculation: 483 R Axis:   20 Text Interpretation:  Sinus rhythm Left bundle branch block When compared  with ECG of 01/07/2015, No significant change was found Confirmed by [facility]   MD, DAVID ([id number]) on 01/29/2015 12:23:48 AM      MDM   Final diagnoses:  Acute on chronic systolic congestive heart failure (HCC)  End stage renal disease (HCC)  Elevated troponin I level   Anemia due to end stage renal disease (HCC)  Elevated brain natriuretic peptide (BNP) level    Pt with chf clinically with elevated bnp, however this is consistently elevated per review of chart and difficult to interpret given renal insufficiency.  Worsened creatinine with elevated bun. Pt will require diuresis, but currently on maximum dosing of demadex.  With renal disease, pt may not tolerated increased doses of lasix.  Discussed with Dr. Roxanne Mins who also evaluated pt.  Will pan admission for diuresis and cycling of troponins.  Elevated troponin at 0.09, also c/w prior troponin per review of chart.     Evalee Jefferson, PA-C [id number]  Delora Fuel, MD [id number] [phone]

## 2015-01-29 NOTE — ED Notes (Signed)
Pt currently asymptomatic. States he feels much better after he had a large bowel movement. Ambulating in room without distress. Tolerated ginger ale and crackers. Pt and wife happy about being discharged instead of admission.   IV cath dc intact.   Pt advised to keep his 1pm appt @ [facility] today. DC instructions reviewed.

## 2015-01-29 NOTE — Progress Notes (Signed)
Cardiology Office Note   Date:  01/29/2015   ID:  [name], DOB 09/25/1945, MRN [id number]  PCP:  Odette Fraction, MD  Cardiologist:  Dr Bronson Ing, Dr Brayton Mars, PA-C   History of Present Illness: [name] is a 70 y.o. male with a history of S-D-CHF, NICM w/ EF 25% 022016, HTN, CKD V, tob use, mild AS & AI, St Jude ICD  [name] presents for evaluation of lower extremity edema and shortness of breath.  [name] admits to some dietary indiscretions but states not very many. He also admits that he has not been weighing himself recently. He weighed himself yesterday, and his weight was up by about 10 pounds. He reported increasing shortness of breath and increasing lower extremity edema. Last p.m., the symptoms worsened and he went to the emergency room. He was given IV Lasix as well as a breathing treatment and his symptoms improved. He was there about 6 hours, but then discharged home. He is here today in followup.  Since he got the IV Lasix last night, his weight did not change that much on his home scales, but his symptoms have greatly improved. He no longer has PND and orthopnea is greatly improved. He weighed first thing in the morning today and his weight was 199 but here today in the office, his weight is also 199 after a couple of meals and with heavier clothing on. In addition to his regular dose of torsemide, he took Zaroxolyn 2.5 mg this morning.  The Zaroxolyn is very expensive and so he only buys 10 tablets at a time. However, it does help hold the fluid off and he generally takes it about every other day.  He says all of his doctors bug him about getting a shunt placed in his arm for his kidneys fail, but he has a couple of relatives that have had shunts, one died without ever being used and another one had multiple problems because it kept clotting, so he is very reluctant to do this.  Past Medical History  Diagnosis Date  . Chronic combined  systolic and diastolic CHF (congestive heart failure) ([place])     a. 02/2014 Echo: EF 20-25% (new), Gr 2 DD, mod conc LVH, mild AI/AS, mod MR, sev dil LA, mild TR.  Marland Kitchen Nonischemic cardiomyopathy ([place])     a. 02/2014 Echo: EF 20-25%;  b. 03/2014 Cath: LM nl, LAD min irregs, LCX   . Hypertension   . Chronic kidney disease (CKD), stage IV (severe) ([place]) 12/2009    a. baseline creat now ~ 4.  . Erectile dysfunction   . Colonic polyp   . Low serum testosterone level   . Tobacco abuse   . Benign prostatic hypertrophy   . Obesity, Class I, BMI 30-34.9   . Hyperlipidemia   . Adenomatous colon polyp 2006  . H. pylori infection 2013    treated with prevpac  . Mild Ao Stenosis and Insufficiency   . AICD (automatic cardioverter/defibrillator) present     STJ device, implanted 12/03/14 Dr. Lovena Le  . Sleep apnea 2010    Initially unable to afford CPAP  . 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 Gout     Past Surgical History  Procedure Laterality Date  . Retinal laser procedure      diabetic retinopathy  . Lymph node biopsy      surgical exploration of neck-not entirely clear  that this represented a lymph node biopsy  . Colonoscopy  08/23/2004    Dr. Vivi Ferns rectum, diminutive polyp of the rectosigmoid removed, inflamed focally adenomatous polyp  . Left and right heart catheterization with coronary angiogram N/A 03/19/2014    Procedure: LEFT AND RIGHT HEART CATHETERIZATION WITH CORONARY ANGIOGRAM;  Surgeon: Blane Ohara, MD;  Location: [facility] CATH LAB;  Service: Cardiovascular;  Laterality: N/A;  . Insertion of icd  12/03/2014  . Cardiac catheterization    . Ep implantable device N/A 12/03/2014    Procedure: ICD Implant;  Surgeon: Evans Lance, MD;  Location: [place] CV LAB;  Service: Cardiovascular;  Laterality: N/A;  . Esophagogastroduodenoscopy  2013    Dr. Gala Romney: erosions reminiscent of GAVE but biopsies showed H.pylori gastritis  . Colonoscopy  2013    Dr.  Gala Romney: multiple diminutive polyps in distal sigmoid segment, 4 mm pedunculated polyp at hepatic flexure. Tubular adenomas. Surveillance due 2018   . Cholecystectomy N/A 01/07/2015    Procedure: LAPAROSCOPIC CHOLECYSTECTOMY WITH INTRAOPERATIVE CHOLANGIOGRAM;  Surgeon: Donnie Mesa, MD;  Location: [place];  Service: General;  Laterality: N/A;  . Umbilical hernia repair N/A 01/07/2015    Procedure: HERNIA REPAIR UMBILICAL ADULT;  Surgeon: Donnie Mesa, MD;  Location: [place];  Service: General;  Laterality: N/A;  . Ercp N/A 01/09/2015    Procedure: ENDOSCOPIC RETROGRADE CHOLANGIOPANCREATOGRAPHY (ERCP);  Surgeon: Clarene Essex, MD;  Location: [facility] ENDOSCOPY;  Service: Endoscopy;  Laterality: N/A;    Current Outpatient Prescriptions  Medication Sig Dispense Refill  . albuterol (PROVENTIL HFA;VENTOLIN HFA) 108 (90 Base) MCG/ACT inhaler Inhale 2 puffs into the lungs every 6 (six) hours as needed for wheezing or shortness of breath. 1 Inhaler 0  . aspirin EC 81 MG tablet Take 81 mg by mouth daily.    . calcitRIOL (ROCALTROL) 0.25 MCG capsule Take 1 capsule (0.25 mcg total) by mouth daily. 30 capsule 1  . calcium acetate (PHOSLO) 667 MG capsule Take 1 capsule (667 mg total) by mouth 3 (three) times daily with meals. 90 capsule 1  . cloNIDine (CATAPRES) 0.3 MG tablet Take 1 tablet (0.3 mg total) by mouth daily. 90 tablet 3  . hydrALAZINE (APRESOLINE) 100 MG tablet Take 1 tablet (100 mg total) by mouth 2 (two) times daily. 60 tablet 1  . insulin glargine (LANTUS) 100 UNIT/ML injection Inject 0.15 mLs (15 Units total) into the skin at bedtime. 10 mL 11  . metoprolol succinate (TOPROL-XL) 25 MG 24 hr tablet Take 1 tablet (25 mg total) by mouth daily. 90 tablet 3  . ONE TOUCH ULTRA TEST test strip Check blood sugar each morning fasting and once daily 2 hours after a meal 100 each 5  . oxyCODONE (OXY IR/ROXICODONE) 5 MG immediate release tablet Take 1-2 tablets (5-10 mg total) by mouth every 4 (four) hours as needed  for moderate pain, severe pain or breakthrough pain. 30 tablet 0  . predniSONE (DELTASONE) 20 MG tablet 3 tabs poqday 1-2, 2 tabs poqday 3-4, 1 tab poqday 5-6 12 tablet 0  . torsemide (DEMADEX) 100 MG tablet Take 1 tablet (100 mg total) by mouth 2 (two) times daily. 180 tablet 3   No current facility-administered medications for this visit.    Allergies:   Ace inhibitors    Social History:  The patient  reports that he has been smoking Cigarettes.  He started smoking about 56 years ago. He has a 28 pack-year smoking history. He has never used smokeless tobacco. He reports that he  does not drink alcohol or use illicit drugs.   Family History:  The patient's family history includes Cancer in his father and mother; Diabetes in his sister; Hypertension in his mother.    ROS:  Please see the history of present illness. All other systems are reviewed and negative.    PHYSICAL EXAM: VS:  BP 142/60 mmHg  Pulse 76  Ht 5\' 9"  (1.753 m)  Wt 199 lb (90.266 kg)  BMI 29.37 kg/m2  SpO2 97% , BMI Body mass index is 29.37 kg/(m^2). GEN: Well nourished, well developed, male in no acute distress HEENT: normal for age  Neck: JVD 9 cm, no carotid bruit, no masses Cardiac: RRR; SEM & DM noted, no rubs, or gallops Respiratory: rales bases bilaterally, good air exchange, normal work of breathing GI: soft, nontender, nondistended, + BS MS: no deformity or atrophy; 1-2+ edema; distal pulses are 2+ in all 4 extremities  Skin: warm and dry, no rash Neuro:  Strength and sensation are intact Psych: euthymic mood, full affect   EKG:  EKG is ordered today.   Recent Labs: 05/21/2014: TSH 0.915 01/07/2015: Magnesium 2.1 01/11/2015: ALT 125* 01/28/2015: B Natriuretic Peptide >4500.0*; BUN 85*; Creatinine, Ser 5.53*; Hemoglobin 10.7*; Platelets 262; Potassium 3.9; Sodium 139    Lipid Panel    Component Value Date/Time   CHOL 185 09/25/2014 0926   TRIG 80 09/25/2014 0926   HDL 68 09/25/2014 0926    CHOLHDL 2.7 09/25/2014 0926   VLDL 16 09/25/2014 0926   LDLCALC 101 09/25/2014 0926     Wt Readings from Last 3 Encounters:  01/29/15 199 lb (90.266 kg)  01/28/15 201 lb (91.173 kg)  01/16/15 196 lb (88.905 kg)     Other studies Reviewed: Additional studies/ records that were reviewed today include: hospital records office notes and other records.  ASSESSMENT AND PLAN:  1.  Acute on chronic combined systolic and diastolic CHF: I discussed options with the patient. The first option is that he takes extra torsemide for a day or 2. The next option is that he continues to [place], continue daily weights and make sure he is very compliant with her renal diet. He has a followup appointment with Dr. Mercy Moore later this month. I do not feel he requires admission at this time and ensure his symptoms are greatly improved from yesterday.  [name] and prefers to continue taking the Zaroxolyn every other day and monitor himself more closely and see if he can get his weight down. He promises to keep the appointment with Dr. Mercy Moore.  2. ICD in place: He had a St. Jude device, and has had no palpitations, no feeling that his device will fire, no presyncope or syncope. He is encouraged to continue getting the regular device checks and followup as scheduled.  3. Chronic kidney disease: At one point in November, his creatinine was greater than 7. Currently it is a little above his baseline, but this is difficult to determine. I discussed the reasons for him avoiding getting dialysis access.  This is our agreement: We will quit asking him when he is going get his dialysis access placed and he agrees to schedule the procedure the next time his creatinine goes above 7. He understands that he will will probably eventually need it, but since a couple of relatives have had negative experiences with this, he is extremely reluctant to have performed.  Current medicines are reviewed at length with the patient  today.  The patient does not have concerns  regarding medicines.  The following changes have been made:  no change  Labs/ tests ordered today include:  No orders of the defined types were placed in this encounter.     Disposition:   FU with Dr Bronson Ing and Dr Lovena Le as scheduled  Signed, Lenoard Aden  01/29/2015 1:04 PM    [facility] [address], [place], [place]  [address] Phone: [phone]; Fax: [phone]

## 2015-01-29 NOTE — ED Provider Notes (Signed)
He apparently had a good diuresis and response to the high dose furosemide and told the admitting physician that he would rather go home. I went to reevaluate him and he was resting comfortably. He was ambulated around the ED without any dyspnea and without any hypoxia. He was felt to be stable for discharge at this point. He is to keep his appointment with the cardiology nurse practitioner later today.   Delora Fuel, [place] [id number] [phone]

## 2015-01-29 NOTE — ED Notes (Signed)
Patient walked around the nurses station with assistance with 100% and HR 88 - 94.

## 2015-01-30 ENCOUNTER — Telehealth: Payer: Self-pay | Admitting: Family Medicine

## 2015-01-30 MED ORDER — ALBUTEROL SULFATE (2.5 MG/3ML) 0.083% IN NEBU: 2.5000 mg | INHALATION_SOLUTION | Freq: Four times a day (QID) | RESPIRATORY_TRACT | Status: DC | PRN

## 2015-01-30 NOTE — Telephone Encounter (Signed)
He has a remote history of copd.  However, I would explain to the patient only to use this if wheezing.  Usually his sob is due to fluid not wheezing.

## 2015-01-30 NOTE — Telephone Encounter (Signed)
?   OK to Refill - I do not see on medication list or in chart notes

## 2015-01-30 NOTE — Telephone Encounter (Signed)
PATIENT CALLING TO GET REFILL ON HIS ALBUTEROL SOLUTION FOR HIS NEBULIZER  [name]  [phone]

## 2015-01-30 NOTE — Telephone Encounter (Signed)
Medication called/sent to requested pharmacy and pt aware of recommendations. 

## 2015-02-02 ENCOUNTER — Telehealth: Payer: Self-pay | Admitting: Physician Assistant

## 2015-02-02 DIAGNOSIS — N185 Chronic kidney disease, stage 5: Secondary | ICD-10-CM

## 2015-02-02 NOTE — Telephone Encounter (Signed)
Pt denied the metolazone 5 mg daily because he was told to take 2.5 mg.Now he calls with this issue.Please advise

## 2015-02-02 NOTE — Telephone Encounter (Signed)
The patient states his fluid retention doesn't seem to be improving since his appointment last Thursday, 01/29/15. He still cannot catch his breath when he lies down.   Patient is wondering if we should modify or change his medications.

## 2015-02-03 MED ORDER — METOLAZONE 5 MG PO TABS: ORAL_TABLET | ORAL | Status: DC

## 2015-02-03 NOTE — Telephone Encounter (Signed)
He has stage 5 CKD and needs dialysis. This will be the way volume is managed. The fact that he is not responding to such high doses of torsemide is a result of his end-stage renal disease. Can try taking metolazone 5 mg daily x next 5 days with BMET two days after initiation. Would have him see nephrology.

## 2015-02-03 NOTE — Telephone Encounter (Signed)
I spoke with patient and he agrees to go to [facility] next week for evaluation.I will e-scribe metolazone 5 mg for 5 days for him and enter lab slip.He will take metolazone today and tomorrow,then have BMET done

## 2015-02-04 ENCOUNTER — Telehealth: Payer: Self-pay | Admitting: Family Medicine

## 2015-02-04 NOTE — Telephone Encounter (Signed)
Pt states that kidney MD put him on acetate (PHOSLO) 667 MG capsule after his stay in the hospital and he is wondering why he has to take this as he is not on dialysis plus he thinks he it is causing some side effects - SOB He wants to know if he should be taking this or not.

## 2015-02-05 ENCOUNTER — Other Ambulatory Visit (HOSPITAL_COMMUNITY)
Admission: RE | Admit: 2015-02-05 | Discharge: 2015-02-05 | Disposition: A | Discharge status: Home / Self Care | Payer: Medicare Other | Source: Ambulatory Visit | Attending: Physician Assistant | Admitting: Physician Assistant

## 2015-02-05 ENCOUNTER — Other Ambulatory Visit: Payer: Self-pay

## 2015-02-05 DIAGNOSIS — N185 Chronic kidney disease, stage 5: Secondary | ICD-10-CM | POA: Insufficient documentation

## 2015-02-05 LAB — BASIC METABOLIC PANEL
ANION GAP: 13 (ref 5–15)
BUN: 95 mg/dL — AB (ref 6–20)
CHLORIDE: 100 mmol/L — AB (ref 101–111)
CO2: 26 mmol/L (ref 22–32)
Calcium: 8.9 mg/dL (ref 8.9–10.3)
Creatinine, Ser: 5.6 mg/dL — ABNORMAL HIGH (ref 0.61–1.24)
GFR, EST AFRICAN AMERICAN: 11 mL/min — AB (ref >60)
GFR, EST NON AFRICAN AMERICAN: 9 mL/min — AB (ref >60)
Glucose, Bld: 94 mg/dL (ref 65–99)
POTASSIUM: 3.2 mmol/L — AB (ref 3.5–5.1)
SODIUM: 139 mmol/L (ref 135–145)

## 2015-02-05 NOTE — Telephone Encounter (Signed)
Yes take this.  Helps prevent damage to his bones because of his kidney problems.  Should not cause SOB.  If SOB NTBS.

## 2015-02-05 NOTE — Telephone Encounter (Signed)
LMTRC

## 2015-02-06 ENCOUNTER — Telehealth: Payer: Self-pay | Admitting: Family Medicine

## 2015-02-06 ENCOUNTER — Other Ambulatory Visit: Payer: Self-pay | Admitting: Cardiovascular Disease

## 2015-02-06 MED ORDER — TORSEMIDE 100 MG PO TABS: 100.0000 mg | ORAL_TABLET | Freq: Two times a day (BID) | ORAL | Status: DC

## 2015-02-06 NOTE — Telephone Encounter (Signed)
Pt called requesting a referral to a pulmonologist due to his ongoing breathing issues. He has seen one in the past, but would like to see a different doctor this time.  You can reach him @ [phone]

## 2015-02-06 NOTE — Telephone Encounter (Signed)
Pt is wondering if we can refill his Demadex

## 2015-02-06 NOTE — Telephone Encounter (Signed)
Refilled torsemide to [facility] for pt

## 2015-02-09 NOTE — Telephone Encounter (Signed)
I spoke with pt last week and he will take KCL 20 meq x 1 dose.he assured me he would go to nephrologist this week

## 2015-02-09 NOTE — Telephone Encounter (Signed)
-----   Message from Herminio Commons, MD sent at 02/06/2015  4:45 PM EST ----- Regarding: RE: lab results Would give KCl 20 meq x 1. GFR is 11 ml/min, consistent with stage 5 CKD. He needs dialysis.  ----- Message -----    From: Bernita Raisin, RN    Sent: 02/06/2015   2:39 PM      To: Herminio Commons, MD Subject: lab results                                    Can u please look at his labs,he is standing in office.he has apt with [facility] next week.He has been on metolazone 5 mg for 2 days and states he is no different.we know he needs dialysis

## 2015-02-10 DIAGNOSIS — R809 Proteinuria, unspecified: Secondary | ICD-10-CM | POA: Diagnosis not present

## 2015-02-10 DIAGNOSIS — N185 Chronic kidney disease, stage 5: Secondary | ICD-10-CM | POA: Diagnosis not present

## 2015-02-10 DIAGNOSIS — D631 Anemia in chronic kidney disease: Secondary | ICD-10-CM | POA: Diagnosis not present

## 2015-02-10 DIAGNOSIS — E1129 Type 2 diabetes mellitus with other diabetic kidney complication: Secondary | ICD-10-CM | POA: Diagnosis not present

## 2015-02-10 DIAGNOSIS — I120 Hypertensive chronic kidney disease with stage 5 chronic kidney disease or end stage renal disease: Secondary | ICD-10-CM | POA: Diagnosis not present

## 2015-02-10 NOTE — Telephone Encounter (Signed)
LMTRC

## 2015-02-11 NOTE — Telephone Encounter (Signed)
Spoke to pt and informed of below - he states that the MD has cut him down to 1 pill a day and he called the FDA and they told him that it can cause SOB. But he is doing better now.

## 2015-02-12 ENCOUNTER — Other Ambulatory Visit: Payer: Self-pay | Admitting: Family Medicine

## 2015-02-12 NOTE — Telephone Encounter (Signed)
Refill appropriate and filled per protocol. 

## 2015-02-13 NOTE — Telephone Encounter (Signed)
Called pt to see if he kept his appt. With the kidney dr., he stated that he went and they discussed numerous options. He stated that he would call to let us know what he decided to do.

## 2015-02-13 NOTE — Telephone Encounter (Signed)
-----   Message from Lonn Georgia, PA-C sent at 02/12/2015  6:09 PM EST ----- If he has an appointment with [facility], just make sure he kept it. I am happy for them to do further management of his K+ Thanks, Suanne Marker ----- Message -----    From: Drema Dallas, CMA    Sent: 02/10/2015  12:27 PM      To: Lonn Georgia, PA-C  Pt is no longer taking the zaroxolyn. He finished up the last dose on 1/22. Dr. Bronson Ing had told him to take 1 extra potassium pill on Wednesday, The pt was only on the zaroxolyn for 5 days and was told not to take anaymore. He has an appointment with [facility] today. Didn't know what you wanted me to do now.

## 2015-02-16 ENCOUNTER — Encounter: Payer: Self-pay | Admitting: Family Medicine

## 2015-02-16 ENCOUNTER — Ambulatory Visit (INDEPENDENT_AMBULATORY_CARE_PROVIDER_SITE_OTHER): Payer: Medicare Other | Admitting: Family Medicine

## 2015-02-16 ENCOUNTER — Ambulatory Visit: Payer: Medicare Other | Admitting: Family Medicine

## 2015-02-16 ENCOUNTER — Ambulatory Visit: Payer: Self-pay | Admitting: Family Medicine

## 2015-02-16 VITALS — BP 150/52 | HR 96 | Temp 96.8°F | Resp 16 | Ht 71.0 in | Wt 196.0 lb

## 2015-02-16 DIAGNOSIS — N184 Chronic kidney disease, stage 4 (severe): Secondary | ICD-10-CM | POA: Diagnosis not present

## 2015-02-16 DIAGNOSIS — I5020 Unspecified systolic (congestive) heart failure: Secondary | ICD-10-CM

## 2015-02-16 DIAGNOSIS — J438 Other emphysema: Secondary | ICD-10-CM

## 2015-02-16 NOTE — Progress Notes (Signed)
Subjective:    Patient ID: [name], male    DOB: 12/07/1945, 70 y.o.   MRN: [id number]  HPI Patient was seen at the hospital earlier in January for shortness of breath. He was found to have gained 5 pounds rapidly. Chest x-ray revealed congestive heart failure with pulmonary edema. He responded to IV diuretics and was discharged home. He followed up with the cardiology group the following day and was found to be stable was maintained on his diuretics. [name] weight today is 196 pounds according to my scales. However I look back in the last several office visits. In December he weighed 196 pounds and prior to evaluate 197 pounds according to my scale. Therefore the patient is not retaining weight. There is no peripheral edema on examination today. However he does report dyspnea on exertion. He also reports audible wheezing that he hears at night. He denies any cough. He does have a 50-pack-year history of smoking but he has since quit. Patient states that when he uses albuterol, he received some benefit in the shortness of breath improves. He reports dyspnea with minimal ambulation. He has severe congestive heart failure with an ejection fraction of 20-25%. Past medical history is also complicated by borderline hemodialysis dependent chronic kidney disease. He continues to refuseplacement of venous access. However on exam today there is no evidence of fluid retention. He does report dyspnea on exertion. He reports orthopnea. However he also reports wheezing. I'll perform pulmonary function test today in office. FEV1 is 1.31 L. FVC is 2.44 L. FEV1 percentage is 54% which is abnormal. FEV1 is 45% of predicted. This qualifies a severe obstruction or stage III COPD Past Medical History  Diagnosis Date  . Chronic combined systolic and diastolic CHF (congestive heart failure) ([place])     a. 02/2014 Echo: EF 20-25% (new), Gr 2 DD, mod conc LVH, mild AI/AS, mod MR, sev dil LA, mild TR.  Marland Kitchen Nonischemic  cardiomyopathy ([place])     a. 02/2014 Echo: EF 20-25%;  b. 03/2014 Cath: LM nl, LAD min irregs, LCX   . Hypertension   . Chronic kidney disease (CKD), stage IV (severe) ([place]) 12/2009    a. baseline creat now ~ 4.  . Erectile dysfunction   . Colonic polyp   . Low serum testosterone level   . Tobacco abuse   . Benign prostatic hypertrophy   . Obesity, Class I, BMI 30-34.9   . Hyperlipidemia   . Adenomatous colon polyp 2006  . H. pylori infection 2013    treated with prevpac  . Mild Ao Stenosis and Insufficiency   . AICD (automatic cardioverter/defibrillator) present     STJ device, implanted 12/03/14 Dr. Lovena Le  . Sleep apnea 2010    Initially unable to afford CPAP  . 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 Gout    Past Surgical History  Procedure Laterality Date  . Retinal laser procedure      diabetic retinopathy  . Lymph node biopsy      surgical exploration of neck-not entirely clear that this represented a lymph node biopsy  . Colonoscopy  08/23/2004    Dr. Vivi Ferns rectum, diminutive polyp of the rectosigmoid removed, inflamed focally adenomatous polyp  . Left and right heart catheterization with coronary angiogram N/A 03/19/2014    Procedure: LEFT AND RIGHT HEART CATHETERIZATION WITH CORONARY ANGIOGRAM;  Surgeon: Blane Ohara, MD;  Location: [facility] CATH LAB;  Service: Cardiovascular;  Laterality: N/A;  .  Cardiac catheterization    . Ep implantable device N/A 12/03/2014    Procedure: ICD Implant;  Surgeon: Evans Lance, MD;  Location: [place] CV LAB;  Service: Cardiovascular;  Laterality: N/A;; St Jude  . Esophagogastroduodenoscopy  2013    Dr. Gala Romney: erosions reminiscent of GAVE but biopsies showed H.pylori gastritis  . Colonoscopy  2013    Dr. Gala Romney: multiple diminutive polyps in distal sigmoid segment, 4 mm pedunculated polyp at hepatic flexure. Tubular adenomas. Surveillance due 2018   . Cholecystectomy N/A 01/07/2015    Procedure:  LAPAROSCOPIC CHOLECYSTECTOMY WITH INTRAOPERATIVE CHOLANGIOGRAM;  Surgeon: Donnie Mesa, MD;  Location: [place];  Service: General;  Laterality: N/A;  . Umbilical hernia repair N/A 01/07/2015    Procedure: HERNIA REPAIR UMBILICAL ADULT;  Surgeon: Donnie Mesa, MD;  Location: [place];  Service: General;  Laterality: N/A;  . Ercp N/A 01/09/2015    Procedure: ENDOSCOPIC RETROGRADE CHOLANGIOPANCREATOGRAPHY (ERCP);  Surgeon: Clarene Essex, MD;  Location[facility] ENDOSCOPY;  Service: Endoscopy;  Laterality: N/A;   Current Outpatient Prescriptions on File Prior to Visit  Medication Sig Dispense Refill  . albuterol (PROVENTIL HFA;VENTOLIN HFA) 108 (90 Base) MCG/ACT inhaler Inhale 2 puffs into the lungs every 6 (six) hours as needed for wheezing or shortness of breath. 1 Inhaler 0  . albuterol (PROVENTIL) (2.5 MG/3ML) 0.083% nebulizer solution Take 3 mLs (2.5 mg total) by nebulization every 6 (six) hours as needed for wheezing. 75 mL 12  . aspirin EC 81 MG tablet Take 81 mg by mouth daily.    . calcitRIOL (ROCALTROL) 0.25 MCG capsule Take 1 capsule (0.25 mcg total) by mouth daily. 30 capsule 1  . calcium acetate (PHOSLO) 667 MG capsule Take 1 capsule (667 mg total) by mouth 3 (three) times daily with meals. 90 capsule 1  . cloNIDine (CATAPRES) 0.3 MG tablet Take 1 tablet (0.3 mg total) by mouth daily. 90 tablet 3  . hydrALAZINE (APRESOLINE) 100 MG tablet Take 1 tablet (100 mg total) by mouth 2 (two) times daily. 60 tablet 1  . insulin glargine (LANTUS) 100 UNIT/ML injection Inject 0.15 mLs (15 Units total) into the skin at bedtime. 10 mL 11  . LANTUS SOLOSTAR 100 UNIT/ML Solostar Pen INJECT 30 UNITS SQ ONCE DAILY AT 10PM 15 mL 0  . metolazone (ZAROXOLYN) 5 MG tablet Take 5 mg daily for the next 5 days and the STOP 5 tablet 0  . metoprolol succinate (TOPROL-XL) 25 MG 24 hr tablet Take 1 tablet (25 mg total) by mouth daily. 90 tablet 3  . ONE TOUCH ULTRA TEST test strip Check blood sugar each morning fasting and once  daily 2 hours after a meal 100 each 5  . oxyCODONE (OXY IR/ROXICODONE) 5 MG immediate release tablet Take 1-2 tablets (5-10 mg total) by mouth every 4 (four) hours as needed for moderate pain, severe pain or breakthrough pain. 30 tablet 0  . torsemide (DEMADEX) 100 MG tablet Take 1 tablet (100 mg total) by mouth 2 (two) times daily. 60 tablet 1   No current facility-administered medications on file prior to visit.   Allergies  Allergen Reactions  . Ace Inhibitors Cough   Social History   Social History  . Marital Status: Married    Spouse Name: N/A  . Number of Children: 4  . Years of Education: N/A   Occupational History  . Semi - Retired Administrator    Social History Main Topics  . Smoking status: Current Some Day Smoker -- 0.50 packs/day for  56 years    Types: Cigarettes    Start date: 05/31/1958  . Smokeless tobacco: Never Used  . Alcohol Use: No  . Drug Use: No  . Sexual Activity: Not Currently   Other Topics Concern  . Not on file   Social History Narrative      Review of Systems  All other systems reviewed and are negative.      Objective:   Physical Exam  Neck: Neck supple. No JVD present.  Cardiovascular: Normal rate, regular rhythm and normal heart sounds.   Pulmonary/Chest: Effort normal and breath sounds normal. No respiratory distress. He has no wheezes. He has no rales.  Abdominal: Soft. Bowel sounds are normal. He exhibits no distension. There is no tenderness. There is no rebound.  Musculoskeletal: He exhibits no edema.  Vitals reviewed.         Assessment & Plan:  CHF (congestive heart failure), NYHA class IV, unspecified failure chronicity, systolic (HCC)  Chronic kidney disease (CKD), stage IV (severe) ([place])  Other emphysema ([place])  Patient has class IV congestive heart failure with an ejection fraction of 20-25%. It is certainly possible that this is causing his shortness of breath and dyspnea on exertion. However there is no  evidence of fluid overload on exam today. His weight is stable. I can appreciate no pulmonary edema on exam. Other possible causes of his shortness of breath and dyspnea on exertion include anemia as well as the obvious COPD that he has today on pulmonary function test. I will treat his COPD with Anoro inhalation a day and recheck the patient in 2 weeks. If his shortness of breath has not improved at that point, he may benefit from seeing the congestive heart failure clinic to maximize therapy for congestive heart failure. I see no role for increased diuretics today on his exam.  Hemoglobin was just checked January 11 and was found to be 10.7. Therefore I do not believe his anemia is severe enough to cause of shortness of breath. Recheck in 2 weeks or sooner if worse

## 2015-02-17 ENCOUNTER — Encounter (HOSPITAL_COMMUNITY)
Admission: RE | Admit: 2015-02-17 | Discharge: 2015-02-17 | Disposition: A | Discharge status: Home / Self Care | Payer: Medicare Other | Source: Ambulatory Visit | Attending: Nephrology | Admitting: Nephrology

## 2015-02-17 ENCOUNTER — Encounter (HOSPITAL_COMMUNITY): Payer: Self-pay

## 2015-02-17 DIAGNOSIS — D638 Anemia in other chronic diseases classified elsewhere: Secondary | ICD-10-CM | POA: Diagnosis not present

## 2015-02-17 DIAGNOSIS — N184 Chronic kidney disease, stage 4 (severe): Secondary | ICD-10-CM | POA: Insufficient documentation

## 2015-02-17 LAB — HEMOGLOBIN AND HEMATOCRIT, BLOOD
HEMATOCRIT: 31.8 % — AB (ref 39.0–52.0)
HEMOGLOBIN: 10.8 g/dL — AB (ref 13.0–17.0)

## 2015-02-17 MED ORDER — DARBEPOETIN ALFA 100 MCG/0.5ML IJ SOSY
100.0000 ug | PREFILLED_SYRINGE | Freq: Once | INTRAMUSCULAR | Status: AC
  Administered 2015-02-17: 100 ug via SUBCUTANEOUS
  Filled 2015-02-17: qty 0.5

## 2015-02-17 NOTE — Progress Notes (Signed)
Results for [name], [name] (MRN [id number]) as of 02/17/2015 15:20  Labs from 02/17/2015 received Aranesp 165mcg SQ  as indicated   Ref. Range 02/05/2015 15:55 02/17/2015 09:04  Hemoglobin Latest Ref Range: 13.0-17.0 g/dL  10.8 (L)  HCT Latest Ref Range: 39.0-52.0 %  31.8 (L)

## 2015-02-20 ENCOUNTER — Ambulatory Visit: Payer: Medicare Other | Admitting: Cardiovascular Disease

## 2015-02-24 ENCOUNTER — Other Ambulatory Visit: Payer: Self-pay | Admitting: Family Medicine

## 2015-02-24 MED ORDER — INSULIN GLARGINE 100 UNIT/ML SOLOSTAR PEN: 30.0000 [IU] | PEN_INJECTOR | Freq: Every day | SUBCUTANEOUS | Status: DC

## 2015-02-24 NOTE — Telephone Encounter (Signed)
Medication refilled per protocol. 

## 2015-02-26 DIAGNOSIS — J40 Bronchitis, not specified as acute or chronic: Secondary | ICD-10-CM | POA: Diagnosis not present

## 2015-02-26 DIAGNOSIS — R0602 Shortness of breath: Secondary | ICD-10-CM | POA: Diagnosis not present

## 2015-03-02 ENCOUNTER — Ambulatory Visit (INDEPENDENT_AMBULATORY_CARE_PROVIDER_SITE_OTHER): Payer: Medicare Other | Admitting: Family Medicine

## 2015-03-02 ENCOUNTER — Encounter: Payer: Self-pay | Admitting: Family Medicine

## 2015-03-02 VITALS — BP 150/50 | HR 84 | Temp 97.7°F | Resp 18 | Ht 71.0 in | Wt 203.0 lb

## 2015-03-02 DIAGNOSIS — J438 Other emphysema: Secondary | ICD-10-CM

## 2015-03-02 DIAGNOSIS — M7989 Other specified soft tissue disorders: Secondary | ICD-10-CM

## 2015-03-02 DIAGNOSIS — I5020 Unspecified systolic (congestive) heart failure: Secondary | ICD-10-CM | POA: Diagnosis not present

## 2015-03-02 DIAGNOSIS — N184 Chronic kidney disease, stage 4 (severe): Secondary | ICD-10-CM | POA: Diagnosis not present

## 2015-03-02 MED ORDER — TORSEMIDE 100 MG PO TABS: 150.0000 mg | ORAL_TABLET | Freq: Two times a day (BID) | ORAL | Status: DC

## 2015-03-02 NOTE — Progress Notes (Signed)
Subjective:    Patient ID: [name], male    DOB: 11/09/1945, 70 y.o.   MRN: [id number]  HPI 02/16/15 Patient was seen at the hospital earlier in January for shortness of breath. He was found to have gained 5 pounds rapidly. Chest x-ray revealed congestive heart failure with pulmonary edema. He responded to IV diuretics and was discharged home. He followed up with the cardiology group the following day and was found to be stable was maintained on his diuretics. [name] weight today is 196 pounds according to my scales. However I look back in the last several office visits. In December he weighed 196 pounds and prior to evaluate 197 pounds according to my scale. Therefore the patient is not retaining weight. There is no peripheral edema on examination today. However he does report dyspnea on exertion. He also reports audible wheezing that he hears at night. He denies any cough. He does have a 50-pack-year history of smoking but he has since quit. Patient states that when he uses albuterol, he received some benefit in the shortness of breath improves. He reports dyspnea with minimal ambulation. He has severe congestive heart failure with an ejection fraction of 20-25%. Past medical history is also complicated by borderline hemodialysis dependent chronic kidney disease. He continues to refuseplacement of venous access. However on exam today there is no evidence of fluid retention. He does report dyspnea on exertion. He reports orthopnea. However he also reports wheezing. I'll perform pulmonary function test today in office. FEV1 is 1.31 L. FVC is 2.44 L. FEV1 percentage is 54% which is abnormal. FEV1 is 45% of predicted. 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  AT that time, my plan was:  Patient has class IV congestive heart failure with an ejection fraction of 20-25%. It is certainly possible that this is causing his shortness of breath and dyspnea on exertion. However there is no  evidence of fluid overload on exam today. His weight is stable. I can appreciate no pulmonary edema on exam. Other possible causes of his shortness of breath and dyspnea on exertion include anemia as well as the obvious COPD that he has today on pulmonary function test. I will treat his COPD with Anoro inhalation a day and recheck the patient in 2 weeks. If his shortness of breath has not improved at that point, he may benefit from seeing the congestive heart failure clinic to maximize therapy for congestive heart failure. I see no role for increased diuretics today on his exam.  Hemoglobin was just checked January 11 and was found to be 10.7. Therefore I do not believe his anemia is severe enough to cause of shortness of breath. Recheck in 2 weeks or sooner if worse  03/02/15 Anoro provided no benefit.  He has gained 8 lbs since last OV.  He reports orthopnea, PND, and dyspnea on exertion.  Denies oliguria.  Denies chest pain. Denies pleurisy. Denies hemoptysis. Denies syncope. Denies palpitations Past Medical History  Diagnosis Date  . Chronic combined systolic and diastolic CHF (congestive heart failure) ([place])     a. 02/2014 Echo: EF 20-25% (new), Gr 2 DD, mod conc LVH, mild AI/AS, mod MR, sev dil LA, mild TR.  Marland Kitchen Nonischemic cardiomyopathy ([place])     a. 02/2014 Echo: EF 20-25%;  b. 03/2014 Cath: LM nl, LAD min irregs, LCX   . Hypertension   . Chronic kidney disease (CKD), stage IV (severe) ([place]) 12/2009    a. baseline creat now ~  4.  . Erectile dysfunction   . Colonic polyp   . Low serum testosterone level   . Tobacco abuse   . Benign prostatic hypertrophy   . Obesity, Class I, BMI 30-34.9   . Hyperlipidemia   . Adenomatous colon polyp 2006  . H. pylori infection 2013    treated with prevpac  . Mild Ao Stenosis and Insufficiency   . AICD (automatic cardioverter/defibrillator) present     STJ device, implanted 12/03/14 Dr. Lovena Le  . Sleep apnea 2010    Initially unable to afford CPAP  .  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 Gout    Past Surgical History  Procedure Laterality Date  . Retinal laser procedure      diabetic retinopathy  . Lymph node biopsy      surgical exploration of neck-not entirely clear that this represented a lymph node biopsy  . Colonoscopy  08/23/2004    Dr. Vivi Ferns rectum, diminutive polyp of the rectosigmoid removed, inflamed focally adenomatous polyp  . Left and right heart catheterization with coronary angiogram N/A 03/19/2014    Procedure: LEFT AND RIGHT HEART CATHETERIZATION WITH CORONARY ANGIOGRAM;  Surgeon: Blane Ohara, MD;  Location: [facility] CATH LAB;  Service: Cardiovascular;  Laterality: N/A;  . Cardiac catheterization    . Ep implantable device N/A 12/03/2014    Procedure: ICD Implant;  Surgeon: Evans Lance, MD;  Location: [place] CV LAB;  Service: Cardiovascular;  Laterality: N/A;; St Jude  . Esophagogastroduodenoscopy  2013    Dr. Gala Romney: erosions reminiscent of GAVE but biopsies showed H.pylori gastritis  . Colonoscopy  2013    Dr. Gala Romney: multiple diminutive polyps in distal sigmoid segment, 4 mm pedunculated polyp at hepatic flexure. Tubular adenomas. Surveillance due 2018   . Cholecystectomy N/A 01/07/2015    Procedure: LAPAROSCOPIC CHOLECYSTECTOMY WITH INTRAOPERATIVE CHOLANGIOGRAM;  Surgeon: Donnie Mesa, MD;  Location: [place];  Service: General;  Laterality: N/A;  . Umbilical hernia repair N/A 01/07/2015    Procedure: HERNIA REPAIR UMBILICAL ADULT;  Surgeon: Donnie Mesa, MD;  Location: [place];  Service: General;  Laterality: N/A;  . Ercp N/A 01/09/2015    Procedure: ENDOSCOPIC RETROGRADE CHOLANGIOPANCREATOGRAPHY (ERCP);  Surgeon: Clarene Essex, MD;  Location: [facility] ENDOSCOPY;  Service: Endoscopy;  Laterality: N/A;   Current Outpatient Prescriptions on File Prior to Visit  Medication Sig Dispense Refill  . albuterol (PROVENTIL HFA;VENTOLIN HFA) 108 (90 Base) MCG/ACT inhaler Inhale 2 puffs into the  lungs every 6 (six) hours as needed for wheezing or shortness of breath. 1 Inhaler 0  . albuterol (PROVENTIL) (2.5 MG/3ML) 0.083% nebulizer solution Take 3 mLs (2.5 mg total) by nebulization every 6 (six) hours as needed for wheezing. 75 mL 12  . aspirin EC 81 MG tablet Take 81 mg by mouth daily.    . calcitRIOL (ROCALTROL) 0.25 MCG capsule Take 1 capsule (0.25 mcg total) by mouth daily. 30 capsule 1  . calcium acetate (PHOSLO) 667 MG capsule Take 1 capsule (667 mg total) by mouth 3 (three) times daily with meals. 90 capsule 1  . cloNIDine (CATAPRES) 0.3 MG tablet Take 1 tablet (0.3 mg total) by mouth daily. 90 tablet 3  . hydrALAZINE (APRESOLINE) 100 MG tablet Take 1 tablet (100 mg total) by mouth 2 (two) times daily. 60 tablet 1  . Insulin Glargine (LANTUS SOLOSTAR) 100 UNIT/ML Solostar Pen Inject 30 Units into the skin daily at 10 pm. 45 mL 1  . metolazone (ZAROXOLYN)  5 MG tablet Take 5 mg daily for the next 5 days and the STOP 5 tablet 0  . metoprolol succinate (TOPROL-XL) 25 MG 24 hr tablet Take 1 tablet (25 mg total) by mouth daily. 90 tablet 3  . ONE TOUCH ULTRA TEST test strip Check blood sugar each morning fasting and once daily 2 hours after a meal 100 each 5  . oxyCODONE (OXY IR/ROXICODONE) 5 MG immediate release tablet Take 1-2 tablets (5-10 mg total) by mouth every 4 (four) hours as needed for moderate pain, severe pain or breakthrough pain. 30 tablet 0  . torsemide (DEMADEX) 100 MG tablet Take 1 tablet (100 mg total) by mouth 2 (two) times daily. 60 tablet 1   No current facility-administered medications on file prior to visit.   Allergies  Allergen Reactions  . Ace Inhibitors Cough   Social History   Social History  . Marital Status: Married    Spouse Name: N/A  . Number of Children: 4  . Years of Education: N/A   Occupational History  . Semi - Retired Administrator    Social History Main Topics  . Smoking status: Current Some Day Smoker -- 0.50 packs/day for 56 years     Types: Cigarettes    Start date: 05/31/1958  . Smokeless tobacco: Never Used  . Alcohol Use: No  . Drug Use: No  . Sexual Activity: Not Currently   Other Topics Concern  . Not on file   Social History Narrative      Review of Systems  All other systems reviewed and are negative.      Objective:   Physical Exam  Neck: Neck supple. No JVD present.  Cardiovascular: Normal rate, regular rhythm and normal heart sounds.   Pulmonary/Chest: Effort normal. No respiratory distress. He has no wheezes. He has rales.  Abdominal: Soft. Bowel sounds are normal. He exhibits no distension. There is no tenderness. There is no rebound.  Musculoskeletal: He exhibits edema.  Vitals reviewed.         Assessment & Plan:  CHF (congestive heart failure), NYHA class IV, unspecified failure chronicity, systolic (HCC)  Chronic kidney disease (CKD), stage IV (severe) (HCC)  Other emphysema (HCC)  Leg swelling  Patient shows evidence of fluid overload today on exam. This complicated by his borderline stage IV chronic kidney disease. Increase torsemide to 150 mg by mouth twice a day. Recheck on Thursday. At that time I will repeat renal function test along with potassium.

## 2015-03-05 ENCOUNTER — Encounter (HOSPITAL_COMMUNITY): Payer: Self-pay | Admitting: Emergency Medicine

## 2015-03-05 ENCOUNTER — Ambulatory Visit (INDEPENDENT_AMBULATORY_CARE_PROVIDER_SITE_OTHER): Payer: Medicare Other | Admitting: Family Medicine

## 2015-03-05 ENCOUNTER — Emergency Department (HOSPITAL_COMMUNITY)
Admission: EM | Admit: 2015-03-05 | Discharge: 2015-03-05 | Disposition: A | Discharge status: Home / Self Care | Payer: Medicare Other | Source: Home / Self Care | Attending: Emergency Medicine | Admitting: Emergency Medicine

## 2015-03-05 ENCOUNTER — Emergency Department (HOSPITAL_COMMUNITY): Payer: Medicare Other

## 2015-03-05 ENCOUNTER — Encounter: Payer: Self-pay | Admitting: Family Medicine

## 2015-03-05 VITALS — BP 150/68 | HR 84 | Temp 97.4°F | Resp 18 | Ht 71.0 in | Wt 204.0 lb

## 2015-03-05 DIAGNOSIS — I120 Hypertensive chronic kidney disease with stage 5 chronic kidney disease or end stage renal disease: Secondary | ICD-10-CM

## 2015-03-05 DIAGNOSIS — Z7982 Long term (current) use of aspirin: Secondary | ICD-10-CM | POA: Insufficient documentation

## 2015-03-05 DIAGNOSIS — R600 Localized edema: Secondary | ICD-10-CM | POA: Insufficient documentation

## 2015-03-05 DIAGNOSIS — Z87438 Personal history of other diseases of male genital organs: Secondary | ICD-10-CM | POA: Insufficient documentation

## 2015-03-05 DIAGNOSIS — E1122 Type 2 diabetes mellitus with diabetic chronic kidney disease: Secondary | ICD-10-CM | POA: Diagnosis not present

## 2015-03-05 DIAGNOSIS — E1121 Type 2 diabetes mellitus with diabetic nephropathy: Secondary | ICD-10-CM

## 2015-03-05 DIAGNOSIS — I110 Hypertensive heart disease with heart failure: Secondary | ICD-10-CM | POA: Diagnosis not present

## 2015-03-05 DIAGNOSIS — Z8739 Personal history of other diseases of the musculoskeletal system and connective tissue: Secondary | ICD-10-CM | POA: Insufficient documentation

## 2015-03-05 DIAGNOSIS — Z794 Long term (current) use of insulin: Secondary | ICD-10-CM | POA: Insufficient documentation

## 2015-03-05 DIAGNOSIS — Z86010 Personal history of colonic polyps: Secondary | ICD-10-CM | POA: Insufficient documentation

## 2015-03-05 DIAGNOSIS — E669 Obesity, unspecified: Secondary | ICD-10-CM | POA: Insufficient documentation

## 2015-03-05 DIAGNOSIS — R0602 Shortness of breath: Secondary | ICD-10-CM | POA: Diagnosis not present

## 2015-03-05 DIAGNOSIS — I129 Hypertensive chronic kidney disease with stage 1 through stage 4 chronic kidney disease, or unspecified chronic kidney disease: Secondary | ICD-10-CM | POA: Diagnosis not present

## 2015-03-05 DIAGNOSIS — N184 Chronic kidney disease, stage 4 (severe): Secondary | ICD-10-CM | POA: Diagnosis not present

## 2015-03-05 DIAGNOSIS — Z8669 Personal history of other diseases of the nervous system and sense organs: Secondary | ICD-10-CM

## 2015-03-05 DIAGNOSIS — Z86018 Personal history of other benign neoplasm: Secondary | ICD-10-CM

## 2015-03-05 DIAGNOSIS — Z809 Family history of malignant neoplasm, unspecified: Secondary | ICD-10-CM | POA: Diagnosis not present

## 2015-03-05 DIAGNOSIS — N189 Chronic kidney disease, unspecified: Secondary | ICD-10-CM

## 2015-03-05 DIAGNOSIS — Z833 Family history of diabetes mellitus: Secondary | ICD-10-CM | POA: Diagnosis not present

## 2015-03-05 DIAGNOSIS — I132 Hypertensive heart and chronic kidney disease with heart failure and with stage 5 chronic kidney disease, or end stage renal disease: Secondary | ICD-10-CM | POA: Diagnosis not present

## 2015-03-05 DIAGNOSIS — Z79899 Other long term (current) drug therapy: Secondary | ICD-10-CM | POA: Insufficient documentation

## 2015-03-05 DIAGNOSIS — Z95810 Presence of automatic (implantable) cardiac defibrillator: Secondary | ICD-10-CM

## 2015-03-05 DIAGNOSIS — E785 Hyperlipidemia, unspecified: Secondary | ICD-10-CM | POA: Diagnosis not present

## 2015-03-05 DIAGNOSIS — E11319 Type 2 diabetes mellitus with unspecified diabetic retinopathy without macular edema: Secondary | ICD-10-CM | POA: Insufficient documentation

## 2015-03-05 DIAGNOSIS — N186 End stage renal disease: Secondary | ICD-10-CM

## 2015-03-05 DIAGNOSIS — N185 Chronic kidney disease, stage 5: Secondary | ICD-10-CM | POA: Diagnosis not present

## 2015-03-05 DIAGNOSIS — F17210 Nicotine dependence, cigarettes, uncomplicated: Secondary | ICD-10-CM | POA: Insufficient documentation

## 2015-03-05 DIAGNOSIS — I5020 Unspecified systolic (congestive) heart failure: Secondary | ICD-10-CM | POA: Diagnosis not present

## 2015-03-05 DIAGNOSIS — M7989 Other specified soft tissue disorders: Secondary | ICD-10-CM

## 2015-03-05 DIAGNOSIS — Z9889 Other specified postprocedural states: Secondary | ICD-10-CM

## 2015-03-05 DIAGNOSIS — Z8619 Personal history of other infectious and parasitic diseases: Secondary | ICD-10-CM | POA: Insufficient documentation

## 2015-03-05 DIAGNOSIS — I5042 Chronic combined systolic (congestive) and diastolic (congestive) heart failure: Secondary | ICD-10-CM | POA: Insufficient documentation

## 2015-03-05 DIAGNOSIS — E876 Hypokalemia: Secondary | ICD-10-CM | POA: Diagnosis not present

## 2015-03-05 DIAGNOSIS — I5043 Acute on chronic combined systolic (congestive) and diastolic (congestive) heart failure: Secondary | ICD-10-CM | POA: Diagnosis not present

## 2015-03-05 DIAGNOSIS — I429 Cardiomyopathy, unspecified: Secondary | ICD-10-CM | POA: Diagnosis not present

## 2015-03-05 DIAGNOSIS — N400 Enlarged prostate without lower urinary tract symptoms: Secondary | ICD-10-CM | POA: Diagnosis not present

## 2015-03-05 DIAGNOSIS — Z8249 Family history of ischemic heart disease and other diseases of the circulatory system: Secondary | ICD-10-CM | POA: Diagnosis not present

## 2015-03-05 LAB — BASIC METABOLIC PANEL
ANION GAP: 13 (ref 5–15)
BUN: 98 mg/dL — ABNORMAL HIGH (ref 6–20)
CALCIUM: 8.4 mg/dL — AB (ref 8.9–10.3)
CHLORIDE: 101 mmol/L (ref 101–111)
CO2: 27 mmol/L (ref 22–32)
Creatinine, Ser: 5.82 mg/dL — ABNORMAL HIGH (ref 0.61–1.24)
GFR calc non Af Amer: 9 mL/min — ABNORMAL LOW (ref >60)
GFR, EST AFRICAN AMERICAN: 10 mL/min — AB (ref >60)
Glucose, Bld: 175 mg/dL — ABNORMAL HIGH (ref 65–99)
Potassium: 3.6 mmol/L (ref 3.5–5.1)
SODIUM: 141 mmol/L (ref 135–145)

## 2015-03-05 LAB — CBC WITH DIFFERENTIAL/PLATELET
BASOS ABS: 0 10*3/uL (ref 0.0–0.1)
BASOS PCT: 1 %
EOS PCT: 8 %
Eosinophils Absolute: 0.4 10*3/uL (ref 0.0–0.7)
HCT: 29.1 % — ABNORMAL LOW (ref 39.0–52.0)
Hemoglobin: 9.8 g/dL — ABNORMAL LOW (ref 13.0–17.0)
LYMPHS PCT: 13 %
Lymphs Abs: 0.6 10*3/uL — ABNORMAL LOW (ref 0.7–4.0)
MCH: 29.8 pg (ref 26.0–34.0)
MCHC: 33.7 g/dL (ref 30.0–36.0)
MCV: 88.4 fL (ref 78.0–100.0)
Monocytes Absolute: 0.4 10*3/uL (ref 0.1–1.0)
Monocytes Relative: 8 %
NEUTROS ABS: 3.3 10*3/uL (ref 1.7–7.7)
Neutrophils Relative %: 70 %
PLATELETS: 146 10*3/uL — AB (ref 150–400)
RBC: 3.29 MIL/uL — AB (ref 4.22–5.81)
RDW: 16.1 % — ABNORMAL HIGH (ref 11.5–15.5)
WBC: 4.6 10*3/uL (ref 4.0–10.5)

## 2015-03-05 LAB — TROPONIN I: TROPONIN I: 0.12 ng/mL — AB (ref <0.031)

## 2015-03-05 LAB — BRAIN NATRIURETIC PEPTIDE

## 2015-03-05 MED ORDER — FUROSEMIDE 10 MG/ML IJ SOLN
INTRAMUSCULAR | Status: AC
  Filled 2015-03-05: qty 4

## 2015-03-05 MED ORDER — METOLAZONE 5 MG PO TABS
5.0000 mg | ORAL_TABLET | Freq: Every day | ORAL | Status: DC
Start: 2015-03-05 — End: 2015-04-07

## 2015-03-05 MED ORDER — FUROSEMIDE 10 MG/ML IJ SOLN
80.0000 mg | Freq: Once | INTRAMUSCULAR | Status: AC
  Administered 2015-03-05: 80 mg via INTRAVENOUS
  Filled 2015-03-05: qty 8

## 2015-03-05 NOTE — Discharge Instructions (Signed)
Take the Zaroxolyn with your morning dose of torsemide, until the swelling in your legs resolves. Try not to drink more than 40 ounces of fluid in a day. Call your kidney doctor for an appointment to be seen in one week. Return here if needed, for problems.    Chronic Kidney Disease Chronic kidney disease occurs when the kidneys are damaged over a long period. The kidneys are two organs that lie on either side of the spine between the middle of the back and the front of the abdomen. The kidneys:  Remove wastes and extra water from the blood.  Produce important hormones. These help keep bones strong, regulate blood pressure, and help create red blood cells.  Balance the fluids and chemicals in the blood and tissues. A small amount of kidney damage may not cause problems, but a large amount of damage may make it difficult or impossible for the kidneys to work the way they should. If steps are not taken to slow down the kidney damage or stop it from getting worse, the kidneys may stop working permanently. Most of the time, chronic kidney disease does not go away. However, it can often be controlled, and those with the disease can usually live normal lives. CAUSES The most common causes of chronic kidney disease are diabetes and high blood pressure (hypertension). Chronic kidney disease may also be caused by:  Diseases that cause the kidneys' filters to become inflamed.  Diseases that affect the immune system.  Genetic diseases.  Medicines that damage the kidneys, such as anti-inflammatory medicines.  Poisoning or exposure to toxic substances.  A reoccurring kidney or urinary infection.  A problem with urine flow. This may be caused by:  Cancer.  Kidney stones.  An enlarged prostate in males. SIGNS AND SYMPTOMS Because the kidney damage in chronic kidney disease occurs slowly, symptoms develop slowly and may not be obvious until the kidney damage becomes severe. A person may have a  kidney disease for years without showing any symptoms. Symptoms can include:  Swelling (edema) of the legs, ankles, or feet.  Tiredness (lethargy).  Nausea or vomiting.  Confusion.  Problems with urination, such as:  Decreased urine production.  Frequent urination, especially at night.  Frequent accidents in children who are potty trained.  Muscle twitches and cramps.  Shortness of breath.  Weakness.  Persistent itchiness.  Loss of appetite.  Metallic taste in the mouth.  Trouble sleeping.  Slowed development in children.  Short stature in children. DIAGNOSIS Chronic kidney disease may be detected and diagnosed by tests, including blood, urine, imaging, or kidney biopsy tests. TREATMENT Most chronic kidney diseases cannot be cured. Treatment usually involves relieving symptoms and preventing or slowing the progression of the disease. Treatment may include:  A special diet. You may need to avoid alcohol and foods thatare salty and high in potassium.  Medicines. These may:  Lower blood pressure.  Relieve anemia.  Relieve swelling.  Protect the bones. HOME CARE INSTRUCTIONS  Follow your prescribed diet. Your health care provider may instruct you to limit daily salt (sodium) and protein intake.  Take medicines only as directed by your health care provider. Do not take any new medicines (prescription, over-the-counter, or nutritional supplements) unless approved by your health care provider. Many medicines can worsen your kidney damage or need to have the dose adjusted.   Quit smoking if you smoke. Talk to your health care provider about a smoking cessation program.  Keep all follow-up visits as directed by your  health care provider.  Monitor your blood pressure.  Start or continue an exercise plan.  Get immunizations as directed by your health care provider.  Take vitamin and mineral supplements as directed by your health care provider. SEEK  IMMEDIATE MEDICAL CARE IF:  Your symptoms get worse or you develop new symptoms.  You develop symptoms of end-stage kidney disease. These include:  Headaches.  Abnormally dark or light skin.  Numbness in the hands or feet.  Easy bruising.  Frequent hiccups.  Menstruation stops.  You have a fever.  You have decreased urine production.  You havepain or bleeding when urinating. MAKE SURE YOU:  Understand these instructions.  Will watch your condition.  Will get help right away if you are not doing well or get worse. FOR MORE INFORMATION   American Association of Kidney Patients: BombTimer.gl  National Kidney Foundation: www.kidney.[facility]: https://mathis.com/  [facility]: www.lifeoptions.org and www.kidneyschool.org   This information is not intended to replace advice given to you by your health care provider. Make sure you discuss any questions you have with your health care provider.   Document Released: 10/13/2007 Document Revised: 01/24/2014 Document Reviewed: 09/02/2011 Elsevier Interactive Patient Education [employer].

## 2015-03-05 NOTE — ED Provider Notes (Signed)
CSN: [id number]     Arrival date & time 03/05/15  1014 History  By signing my name below, I, [name], attest that this documentation has been prepared under the direction and in the presence of [name], MD. Electronically Signed: Hilda [name], ED Scribe. 03/05/2015. 4:57 PM.    Chief Complaint  Patient presents with  . Shortness of Breath     Patient is a 70 y.o. male presenting with shortness of breath. The history is provided by the patient. No language interpreter was used.  Shortness of Breath Associated symptoms: no chest pain    HPI Comments: [name] is a 70 y.o. male who presents to the Emergency Department complaining of intermittent SOB that has been present for around 3 weeks that worsens at night when he lays down. Pt states he saw his PCP three days ago to receive a routine check-up and was told that there was fluid on his lungs. Pt was told to increase the dosage on his fluid pills from 1 to 1.5 2x per day and return this morning to see PCP. Pt went in to see PCP and was sent to ED for fluid on his lungs. Pt states he saw his cardiologist a month ago and denied any problems. Pt denies dizziness, and denies pain anywhere.   Past Medical History  Diagnosis Date  . Chronic combined systolic and diastolic CHF (congestive heart failure) ([place])     a. 02/2014 Echo: EF 20-25% (new), Gr 2 DD, mod conc LVH, mild AI/AS, mod MR, sev dil LA, mild TR.  Marland Kitchen Nonischemic cardiomyopathy ([place])     a. 02/2014 Echo: EF 20-25%;  b. 03/2014 Cath: LM nl, LAD min irregs, LCX   . Hypertension   . Chronic kidney disease (CKD), stage IV (severe) ([place]) 12/2009    a. baseline creat now ~ 4.  . Erectile dysfunction   . Colonic polyp   . Low serum testosterone level   . Tobacco abuse   . Benign prostatic hypertrophy   . Obesity, Class I, BMI 30-34.9   . Hyperlipidemia   . Adenomatous colon polyp 2006  . H. pylori infection 2013    treated with prevpac  . Mild Ao Stenosis and  Insufficiency   . AICD (automatic cardioverter/defibrillator) present     STJ device, implanted 12/03/14 Dr. Lovena Le  . Sleep apnea 2010    Initially unable to afford CPAP  . 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 Gout    Past Surgical History  Procedure Laterality Date  . Retinal laser procedure      diabetic retinopathy  . Lymph node biopsy      surgical exploration of neck-not entirely clear that this represented a lymph node biopsy  . Colonoscopy  08/23/2004    Dr. Vivi Ferns rectum, diminutive polyp of the rectosigmoid removed, inflamed focally adenomatous polyp  . Left and right heart catheterization with coronary angiogram N/A 03/19/2014    Procedure: LEFT AND RIGHT HEART CATHETERIZATION WITH CORONARY ANGIOGRAM;  Surgeon: Blane Ohara, MD;  Location: [facility] CATH LAB;  Service: Cardiovascular;  Laterality: N/A;  . Cardiac catheterization    . Ep implantable device N/A 12/03/2014    Procedure: ICD Implant;  Surgeon: Evans Lance, MD;  Location: [facility] CV LAB;  Service: Cardiovascular;  Laterality: N/A;; [employer]  . Esophagogastroduodenoscopy  2013    Dr. Gala Romney: erosions reminiscent of GAVE but biopsies showed H.pylori gastritis  . Colonoscopy  2013    Dr. Gala Romney: multiple diminutive polyps in distal sigmoid segment, 4 mm pedunculated polyp at hepatic flexure. Tubular adenomas. Surveillance due 2018   . Cholecystectomy N/A 01/07/2015    Procedure: LAPAROSCOPIC CHOLECYSTECTOMY WITH INTRAOPERATIVE CHOLANGIOGRAM;  Surgeon: Donnie Mesa, MD;  Location: [facility];  Service: General;  Laterality: N/A;  . Umbilical hernia repair N/A 01/07/2015    Procedure: HERNIA REPAIR UMBILICAL ADULT;  Surgeon: Donnie Mesa, MD;  Location: [facility];  Service: General;  Laterality: N/A;  . Ercp N/A 01/09/2015    Procedure: ENDOSCOPIC RETROGRADE CHOLANGIOPANCREATOGRAPHY (ERCP);  Surgeon: Clarene Essex, MD;  [facility] ENDOSCOPY;  Service: Endoscopy;  Laterality: N/A;   Family  History  Problem Relation Age of Onset  . Hypertension Mother   . Cancer Mother   . Cancer Father   . Diabetes Sister    Social History  Substance Use Topics  . Smoking status: Current Some Day Smoker -- 0.50 packs/day for 56 years    Types: Cigarettes    Start date: 05/31/1958  . Smokeless tobacco: Never Used  . Alcohol Use: No    Review of Systems  Respiratory: Positive for shortness of breath.   Cardiovascular: Negative for chest pain.  Neurological: Negative for dizziness.  All other systems reviewed and are negative.     Allergies  Ace inhibitors  Home Medications   Prior to Admission medications   Medication Sig Start Date End Date Taking? Authorizing Provider  albuterol (PROVENTIL HFA;VENTOLIN HFA) 108 (90 Base) MCG/ACT inhaler Inhale 2 puffs into the lungs every 6 (six) hours as needed for wheezing or shortness of breath. 01/16/15  Yes Susy Frizzle, MD  albuterol (PROVENTIL) (2.5 MG/3ML) 0.083% nebulizer solution Take 3 mLs (2.5 mg total) by nebulization every 6 (six) hours as needed for wheezing. 01/30/15  Yes Susy Frizzle, MD  aspirin EC 81 MG tablet Take 81 mg by mouth daily.   Yes Historical Provider, MD  calcitRIOL (ROCALTROL) 0.25 MCG capsule Take 1 capsule (0.25 mcg total) by mouth daily. 01/11/15  Yes Annita Brod, MD  calcium acetate (PHOSLO) 667 MG capsule Take 1 capsule (667 mg total) by mouth 3 (three) times daily with meals. 01/11/15  Yes Annita Brod, MD  cloNIDine (CATAPRES) 0.3 MG tablet Take 1 tablet (0.3 mg total) by mouth daily. 01/08/15  Yes Susy Frizzle, MD  hydrALAZINE (APRESOLINE) 100 MG tablet Take 1 tablet (100 mg total) by mouth 2 (two) times daily. 01/11/15  Yes Annita Brod, MD  Insulin Glargine (LANTUS SOLOSTAR) 100 UNIT/ML Solostar Pen Inject 30 Units into the skin daily at 10 pm. 02/24/15  Yes Susy Frizzle, MD  metoprolol succinate (TOPROL-XL) 25 MG 24 hr tablet Take 1 tablet (25 mg total) by mouth daily.  01/08/15  Yes Susy Frizzle, MD  torsemide (DEMADEX) 100 MG tablet Take 1.5 tablets (150 mg total) by mouth 2 (two) times daily. 03/02/15  Yes Susy Frizzle, MD  metolazone (ZAROXOLYN) 5 MG tablet Take 1 tablet (5 mg total) by mouth daily. 03/05/15   [name], MD  ONE TOUCH ULTRA TEST test strip Check blood sugar each morning fasting and once daily 2 hours after a meal 12/10/14   Susy Frizzle, MD  oxyCODONE (OXY IR/ROXICODONE) 5 MG immediate release tablet Take 1-2 tablets (5-10 mg total) by mouth every 4 (four) hours as needed for moderate pain, severe pain or breakthrough pain. Patient not taking: Reported on 03/05/2015 01/16/15   Susy Frizzle, MD  BP 133/55 mmHg  Pulse 65  Temp(Src) 97.8 F (36.6 C) (Oral)  Resp 14  Ht 5\' 9"  (1.753 m)  Wt 204 lb (92.534 kg)  BMI 30.11 kg/m2  SpO2 96% Physical Exam  Constitutional: He is oriented to person, place, and time. He appears well-developed and well-nourished.  HENT:  Head: Normocephalic and atraumatic.  Right Ear: External ear normal.  Left Ear: External ear normal.  Eyes: Conjunctivae and EOM are normal. Pupils are equal, round, and reactive to light.  Neck: Normal range of motion and phonation normal. Neck supple.  Cardiovascular: Normal rate, regular rhythm and normal heart sounds.   No JVD  Pulmonary/Chest: Effort normal. He has rales. He exhibits no bony tenderness.  Good air movement with a few rales at bilateral bases  Abdominal: Soft. There is no tenderness.  Musculoskeletal: Normal range of motion.  3+ edema in bilateral legs  Neurological: He is alert and oriented to person, place, and time. No cranial nerve deficit or sensory deficit. He exhibits normal muscle tone. Coordination normal.  Skin: Skin is warm, dry and intact.  Psychiatric: He has a normal mood and affect. His behavior is normal. Judgment and thought content normal.  Nursing note and vitals reviewed.   ED Course  Procedures (including critical  care time)  DIAGNOSTIC STUDIES: Oxygen Saturation is 96% on room air, normal by my interpretation.    COORDINATION OF CARE: 10:45 AM Discussed treatment plan with pt at bedside and pt agreed to plan.  Medications  furosemide (LASIX) injection 80 mg (80 mg Intravenous Given 03/05/15 1229)    Patient Vitals for the past 24 hrs:  BP Temp Temp src Pulse Resp SpO2 Height Weight  03/05/15 1500 133/55 mmHg - - 65 14 96 % - -  03/05/15 1430 133/56 mmHg - - 66 17 95 % - -  03/05/15 1400 130/60 mmHg - - 66 18 94 % - -  03/05/15 1330 (!) 135/50 mmHg - - 66 17 96 % - -  03/05/15 1200 135/55 mmHg - - 68 22 95 % - -  03/05/15 1130 (!) 131/48 mmHg - - 67 17 94 % - -  03/05/15 1100 (!) 130/49 mmHg - - 71 20 98 % - -  03/05/15 1030 (!) 133/48 mmHg - - 69 17 96 % - -  03/05/15 1027 (!) 133/48 mmHg 97.8 F (36.6 C) Oral 70 16 96 % 5\' 9"  (1.753 m) 204 lb (92.534 kg)    1:49 PM Reevaluation with update and discussion. After initial assessment and treatment, an updated evaluation reveals no change in clinical status. He has voided 350 cc after IV Lasix. Braxton Weisbecker L   13:45- Paged Nephrology. Dr. Mercy Moore responded, at 14:50- case discussed. He recommends starting Zaroxolyn 5 mg every morning until peripheral edema is improved.  Findings discussed with patient and wife, all questions were answered  Labs Review Labs Reviewed  BASIC METABOLIC PANEL - Abnormal; Notable for the following:    Glucose, Bld 175 (*)    BUN 98 (*)    Creatinine, Ser 5.82 (*)    Calcium 8.4 (*)    GFR calc non Af Amer 9 (*)    GFR calc Af Amer 10 (*)    All other components within normal limits  TROPONIN I - Abnormal; Notable for the following:    Troponin I 0.12 (*)    All other components within normal limits  BRAIN NATRIURETIC PEPTIDE - Abnormal; Notable for the following:    B Natriuretic  Peptide >4500.0 (*)    All other components within normal limits  CBC WITH DIFFERENTIAL/PLATELET - Abnormal; Notable for  the following:    RBC 3.29 (*)    Hemoglobin 9.8 (*)    HCT 29.1 (*)    RDW 16.1 (*)    Platelets 146 (*)    Lymphs Abs 0.6 (*)    All other components within normal limits    BUN  Date Value Ref Range Status  03/05/2015 98* 6 - 20 mg/dL Final  02/05/2015 95* 6 - 20 mg/dL Final  01/28/2015 85* 6 - 20 mg/dL Final  01/20/2015 78* 7 - 25 mg/dL Final   CREAT  Date Value Ref Range Status  01/20/2015 4.81* 0.70 - 1.18 mg/dL Final  01/01/2015 5.14* 0.70 - 1.18 mg/dL Final  12/15/2014 4.61* 0.70 - 1.18 mg/dL Final  09/25/2014 4.90* 0.70 - 1.18 mg/dL Final   CREATININE, SER  Date Value Ref Range Status  03/05/2015 5.82* 0.61 - 1.24 mg/dL Final  02/05/2015 5.60* 0.61 - 1.24 mg/dL Final  01/28/2015 5.53* 0.61 - 1.24 mg/dL Final  01/11/2015 7.56* 0.61 - 1.24 mg/dL Final   HEMOGLOBIN  Date Value Ref Range Status  03/05/2015 9.8* 13.0 - 17.0 g/dL Final  02/17/2015 10.8* 13.0 - 17.0 g/dL Final  01/28/2015 10.7* 13.0 - 17.0 g/dL Final  01/20/2015 9.7* 13.0 - 17.0 g/dL Final     Imaging Review Dg Chest Port 1 View  03/05/2015  CLINICAL DATA:  Shortness of breath for 7 days. EXAM: PORTABLE CHEST 1 VIEW COMPARISON:  02/26/2015 FINDINGS: Single lead AICD noted. Mild enlargement of the cardiopericardial silhouette with atherosclerotic aortic arch and aortic tortuosity. Upper zone pulmonary vascular prominence. Indistinct interstitial accentuation at the right lung base with indistinctness of the right costophrenic angle laterally. IMPRESSION: 1. Mild enlargement of the cardiopericardial silhouette with pulmonary venous hypertension. 2. Indistinct opacity peripherally at the right lung base with possible blunting of the right lateral costophrenic angle. Difficult to exclude bronchopneumonia at the right lung base although this could alternatively represent some confluent interstitial edema and trace right pleural effusion. 3. Atherosclerotic aortic arch. Electronically Signed   By: Van Clines M.D.   On: 03/05/2015 11:09   I have personally reviewed and evaluated these images and lab results as part of my medical decision-making.   EKG Interpretation   Date/Time:  Thursday March 05 2015 10:27:40 EST Ventricular Rate:  69 PR Interval:  166 QRS Duration: 121 QT Interval:  478 QTC Calculation: 512 R Axis:   28 Text Interpretation:  Sinus rhythm Left bundle branch block since last  tracing no significant change Confirmed by Eulis Foster  MD, Noris Kulinski [id number]) on  03/05/2015 2:41:47 PM      MDM   Final diagnoses:  Chronic renal failure, unspecified stage    Pulmonary vascular congestion, with symptomatic fluid overload. End-stage renal disease, with stable normal. Potassium level currently. He is stable for discharge to outpatient management and increase diuretic medication. Doubt ACS, PE or pneumonia.  Nursing Notes Reviewed/ Care Coordinated Applicable Imaging Reviewed Interpretation of Laboratory Data incorporated into ED treatment  The patient appears reasonably screened and/or stabilized for discharge and I doubt any other medical condition or other [facility] requiring further screening, evaluation, or treatment in the ED at this time prior to discharge.  Plan: Home Medications- Zaroxolyn; Home Treatments- rest, fluid restriction 40 oz per day; return here if the recommended treatment, does not improve the symptoms; Recommended follow up- Renal 1 week   I personally  performed the services described in this documentation, which was scribed in my presence. The recorded information has been reviewed and is accurate.      [name], MD 03/05/15 1700

## 2015-03-05 NOTE — ED Notes (Signed)
C/o SOB for last 7 days.  Sent here by Drs office for fluids on lungs.  Denies any chest pain of discomfort.

## 2015-03-05 NOTE — Progress Notes (Signed)
Subjective:    Patient ID: [name], male    DOB: 1945/12/05, 70 y.o.   MRN: [id number]  HPI 02/16/15 Patient was seen at the hospital earlier in January for shortness of breath. He was found to have gained 5 pounds rapidly. Chest x-ray revealed congestive heart failure with pulmonary edema. He responded to IV diuretics and was discharged home. He followed up with the cardiology group the following day and was found to be stable was maintained on his diuretics. [name] weight today is 196 pounds according to my scales. However I look back in the last several office visits. In December he weighed 196 pounds and prior to evaluate 197 pounds according to my scale. Therefore the patient is not retaining weight. There is no peripheral edema on examination today. However he does report dyspnea on exertion. He also reports audible wheezing that he hears at night. He denies any cough. He does have a 50-pack-year history of smoking but he has since quit. Patient states that when he uses albuterol, he received some benefit in the shortness of breath improves. He reports dyspnea with minimal ambulation. He has severe congestive heart failure with an ejection fraction of 20-25%. Past medical history is also complicated by borderline hemodialysis dependent chronic kidney disease. He continues to refuseplacement of venous access. However on exam today there is no evidence of fluid retention. He does report dyspnea on exertion. He reports orthopnea. However he also reports wheezing. I'll perform pulmonary function test today in office. FEV1 is 1.31 L. FVC is 2.44 L. FEV1 percentage is 54% which is abnormal. FEV1 is 45% of predicted. 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  AT that time, my plan was:  Patient has class IV congestive heart failure with an ejection fraction of 20-25%. It is certainly possible that this is causing his shortness of breath and dyspnea on exertion. However there is no  evidence of fluid overload on exam today. His weight is stable. I can appreciate no pulmonary edema on exam. Other possible causes of his shortness of breath and dyspnea on exertion include anemia as well as the obvious COPD that he has today on pulmonary function test. I will treat his COPD with Anoro inhalation a day and recheck the patient in 2 weeks. If his shortness of breath has not improved at that point, he may benefit from seeing the congestive heart failure clinic to maximize therapy for congestive heart failure. I see no role for increased diuretics today on his exam.  Hemoglobin was just checked January 11 and was found to be 10.7. Therefore I do not believe his anemia is severe enough to cause of shortness of breath. Recheck in 2 weeks or sooner if worse  03/02/15 Anoro provided no benefit.  He has gained 8 lbs since last OV.  He reports orthopnea, PND, and dyspnea on exertion.  Denies oliguria.  Denies chest pain. Denies pleurisy. Denies hemoptysis. Denies syncope. Denies palpitations.  At that time,my plan was: Patient shows evidence of fluid overload today on exam. This complicated by his borderline stage IV chronic kidney disease. Increase torsemide to 150 mg by mouth twice a day. Recheck on Thursday. At that time I will repeat renal function test along with potassium.  03/05/15 Patient reports worsening dyspnea on exertion. He reports worsening shortness of breath. He reports orthopnea and PND. He has gained an additional pounds even after I increased his torsemide to 150 mg by mouth twice daily. His fluid overload  seems to be worsening. My concern is that he is not responding to aggressive oral diuresis. In the past he has responded remarkably well to IV Lasix. Past Medical History  Diagnosis Date  . Chronic combined systolic and diastolic CHF (congestive heart failure) ([place])     a. 02/2014 Echo: EF 20-25% (new), Gr 2 DD, mod conc LVH, mild AI/AS, mod MR, sev dil LA, mild TR.  Marland Kitchen  Nonischemic cardiomyopathy ([place])     a. 02/2014 Echo: EF 20-25%;  b. 03/2014 Cath: LM nl, LAD min irregs, LCX   . Hypertension   . Chronic kidney disease (CKD), stage IV (severe) ([place]) 12/2009    a. baseline creat now ~ 4.  . Erectile dysfunction   . Colonic polyp   . Low serum testosterone level   . Tobacco abuse   . Benign prostatic hypertrophy   . Obesity, Class I, BMI 30-34.9   . Hyperlipidemia   . Adenomatous colon polyp 2006  . H. pylori infection 2013    treated with prevpac  . Mild Ao Stenosis and Insufficiency   . AICD (automatic cardioverter/defibrillator) present     STJ device, implanted 12/03/14 Dr. Lovena Le  . Sleep apnea 2010    Initially unable to afford CPAP  . 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 Gout    Past Surgical History  Procedure Laterality Date  . Retinal laser procedure      diabetic retinopathy  . Lymph node biopsy      surgical exploration of neck-not entirely clear that this represented a lymph node biopsy  . Colonoscopy  08/23/2004    Dr. Vivi Ferns rectum, diminutive polyp of the rectosigmoid removed, inflamed focally adenomatous polyp  . Left and right heart catheterization with coronary angiogram N/A 03/19/2014    Procedure: LEFT AND RIGHT HEART CATHETERIZATION WITH CORONARY ANGIOGRAM;  Surgeon: Blane Ohara, MD;  Location: [facility] CATH LAB;  Service: Cardiovascular;  Laterality: N/A;  . Cardiac catheterization    . Ep implantable device N/A 12/03/2014    Procedure: ICD Implant;  Surgeon: Evans Lance, MD;  Location: [place] CV LAB;  Service: Cardiovascular;  Laterality: N/A;; St Jude  . Esophagogastroduodenoscopy  2013    Dr. Gala Romney: erosions reminiscent of GAVE but biopsies showed H.pylori gastritis  . Colonoscopy  2013    Dr. Gala Romney: multiple diminutive polyps in distal sigmoid segment, 4 mm pedunculated polyp at hepatic flexure. Tubular adenomas. Surveillance due 2018   . Cholecystectomy N/A 01/07/2015     Procedure: LAPAROSCOPIC CHOLECYSTECTOMY WITH INTRAOPERATIVE CHOLANGIOGRAM;  Surgeon: Donnie Mesa, MD;  Location: [place];  Service: General;  Laterality: N/A;  . Umbilical hernia repair N/A 01/07/2015    Procedure: HERNIA REPAIR UMBILICAL ADULT;  Surgeon: Donnie Mesa, MD;  Location: [place];  Service: General;  Laterality: N/A;  . Ercp N/A 01/09/2015    Procedure: ENDOSCOPIC RETROGRADE CHOLANGIOPANCREATOGRAPHY (ERCP);  Surgeon: Clarene Essex, MD;  Location: [facility] ENDOSCOPY;  Service: Endoscopy;  Laterality: N/A;   Current Outpatient Prescriptions on File Prior to Visit  Medication Sig Dispense Refill  . albuterol (PROVENTIL HFA;VENTOLIN HFA) 108 (90 Base) MCG/ACT inhaler Inhale 2 puffs into the lungs every 6 (six) hours as needed for wheezing or shortness of breath. 1 Inhaler 0  . albuterol (PROVENTIL) (2.5 MG/3ML) 0.083% nebulizer solution Take 3 mLs (2.5 mg total) by nebulization every 6 (six) hours as needed for wheezing. 75 mL 12  . aspirin EC 81 MG tablet Take 81  mg by mouth daily.    . calcitRIOL (ROCALTROL) 0.25 MCG capsule Take 1 capsule (0.25 mcg total) by mouth daily. 30 capsule 1  . calcium acetate (PHOSLO) 667 MG capsule Take 1 capsule (667 mg total) by mouth 3 (three) times daily with meals. 90 capsule 1  . cloNIDine (CATAPRES) 0.3 MG tablet Take 1 tablet (0.3 mg total) by mouth daily. 90 tablet 3  . hydrALAZINE (APRESOLINE) 100 MG tablet Take 1 tablet (100 mg total) by mouth 2 (two) times daily. 60 tablet 1  . Insulin Glargine (LANTUS SOLOSTAR) 100 UNIT/ML Solostar Pen Inject 30 Units into the skin daily at 10 pm. 45 mL 1  . metolazone (ZAROXOLYN) 5 MG tablet Take 5 mg daily for the next 5 days and the STOP 5 tablet 0  . metoprolol succinate (TOPROL-XL) 25 MG 24 hr tablet Take 1 tablet (25 mg total) by mouth daily. 90 tablet 3  . ONE TOUCH ULTRA TEST test strip Check blood sugar each morning fasting and once daily 2 hours after a meal 100 each 5  . oxyCODONE (OXY IR/ROXICODONE) 5 MG  immediate release tablet Take 1-2 tablets (5-10 mg total) by mouth every 4 (four) hours as needed for moderate pain, severe pain or breakthrough pain. 30 tablet 0  . torsemide (DEMADEX) 100 MG tablet Take 1.5 tablets (150 mg total) by mouth 2 (two) times daily. 90 tablet 2   No current facility-administered medications on file prior to visit.   Allergies  Allergen Reactions  . Ace Inhibitors Cough   Social History   Social History  . Marital Status: Married    Spouse Name: N/A  . Number of Children: 4  . Years of Education: N/A   Occupational History  . Semi - Retired Administrator    Social History Main Topics  . Smoking status: Current Some Day Smoker -- 0.50 packs/day for 56 years    Types: Cigarettes    Start date: 05/31/1958  . Smokeless tobacco: Never Used  . Alcohol Use: No  . Drug Use: No  . Sexual Activity: Not Currently   Other Topics Concern  . Not on file   Social History Narrative      Review of Systems  All other systems reviewed and are negative.      Objective:   Physical Exam  Neck: Neck supple. No JVD present.  Cardiovascular: Normal rate, regular rhythm and normal heart sounds.   Pulmonary/Chest: Effort normal. No respiratory distress. He has no wheezes. He has rales.  Abdominal: Soft. Bowel sounds are normal. He exhibits no distension. There is no tenderness. There is no rebound.  Musculoskeletal: He exhibits edema.  Vitals reviewed.         Assessment & Plan:  CHF (congestive heart failure), NYHA class IV, unspecified failure chronicity, systolic (HCC)  Chronic kidney disease (CKD), stage IV (severe) (HCC)  Leg swelling  Patient has fluid overload today on exam. He is being referred to the emergency room for IV Lasix and lab work to evaluate for worsening kidney failure. We had a long discussion today about the possible need for dialysis to treat his fluid overload. This is been a frequent discussion with this patient who is  recalcitrant to having any access placed in preparation for dialysis. Hopefully he will respond to IV Lasix today.

## 2015-03-08 ENCOUNTER — Encounter (HOSPITAL_COMMUNITY): Payer: Self-pay | Admitting: Emergency Medicine

## 2015-03-08 ENCOUNTER — Emergency Department (HOSPITAL_COMMUNITY): Payer: Medicare Other

## 2015-03-08 ENCOUNTER — Inpatient Hospital Stay (HOSPITAL_COMMUNITY)
Admission: EM | Admit: 2015-03-08 | Discharge: 2015-03-10 | DRG: 291 | Disposition: A | Discharge status: Home / Self Care | Payer: Medicare Other | Attending: Internal Medicine | Admitting: Internal Medicine

## 2015-03-08 DIAGNOSIS — Z8249 Family history of ischemic heart disease and other diseases of the circulatory system: Secondary | ICD-10-CM | POA: Diagnosis not present

## 2015-03-08 DIAGNOSIS — N400 Enlarged prostate without lower urinary tract symptoms: Secondary | ICD-10-CM | POA: Diagnosis present

## 2015-03-08 DIAGNOSIS — I10 Essential (primary) hypertension: Secondary | ICD-10-CM | POA: Diagnosis present

## 2015-03-08 DIAGNOSIS — E876 Hypokalemia: Secondary | ICD-10-CM | POA: Diagnosis not present

## 2015-03-08 DIAGNOSIS — Z7982 Long term (current) use of aspirin: Secondary | ICD-10-CM

## 2015-03-08 DIAGNOSIS — E669 Obesity, unspecified: Secondary | ICD-10-CM | POA: Diagnosis present

## 2015-03-08 DIAGNOSIS — Z794 Long term (current) use of insulin: Secondary | ICD-10-CM

## 2015-03-08 DIAGNOSIS — F17210 Nicotine dependence, cigarettes, uncomplicated: Secondary | ICD-10-CM | POA: Diagnosis present

## 2015-03-08 DIAGNOSIS — E11311 Type 2 diabetes mellitus with unspecified diabetic retinopathy with macular edema: Secondary | ICD-10-CM | POA: Diagnosis not present

## 2015-03-08 DIAGNOSIS — I5020 Unspecified systolic (congestive) heart failure: Secondary | ICD-10-CM | POA: Diagnosis not present

## 2015-03-08 DIAGNOSIS — E785 Hyperlipidemia, unspecified: Secondary | ICD-10-CM | POA: Diagnosis present

## 2015-03-08 DIAGNOSIS — Z809 Family history of malignant neoplasm, unspecified: Secondary | ICD-10-CM | POA: Diagnosis not present

## 2015-03-08 DIAGNOSIS — I429 Cardiomyopathy, unspecified: Secondary | ICD-10-CM | POA: Diagnosis present

## 2015-03-08 DIAGNOSIS — Z86010 Personal history of colonic polyps: Secondary | ICD-10-CM

## 2015-03-08 DIAGNOSIS — Z833 Family history of diabetes mellitus: Secondary | ICD-10-CM

## 2015-03-08 DIAGNOSIS — N184 Chronic kidney disease, stage 4 (severe): Secondary | ICD-10-CM | POA: Diagnosis present

## 2015-03-08 DIAGNOSIS — I132 Hypertensive heart and chronic kidney disease with heart failure and with stage 5 chronic kidney disease, or end stage renal disease: Principal | ICD-10-CM | POA: Diagnosis present

## 2015-03-08 DIAGNOSIS — Z6829 Body mass index (BMI) 29.0-29.9, adult: Secondary | ICD-10-CM

## 2015-03-08 DIAGNOSIS — E1121 Type 2 diabetes mellitus with diabetic nephropathy: Secondary | ICD-10-CM | POA: Diagnosis present

## 2015-03-08 DIAGNOSIS — E11319 Type 2 diabetes mellitus with unspecified diabetic retinopathy without macular edema: Secondary | ICD-10-CM | POA: Diagnosis present

## 2015-03-08 DIAGNOSIS — Z95810 Presence of automatic (implantable) cardiac defibrillator: Secondary | ICD-10-CM | POA: Diagnosis not present

## 2015-03-08 DIAGNOSIS — I110 Hypertensive heart disease with heart failure: Secondary | ICD-10-CM | POA: Diagnosis not present

## 2015-03-08 DIAGNOSIS — I5021 Acute systolic (congestive) heart failure: Secondary | ICD-10-CM | POA: Diagnosis present

## 2015-03-08 DIAGNOSIS — R0602 Shortness of breath: Secondary | ICD-10-CM | POA: Diagnosis not present

## 2015-03-08 DIAGNOSIS — N185 Chronic kidney disease, stage 5: Secondary | ICD-10-CM | POA: Diagnosis present

## 2015-03-08 DIAGNOSIS — E1122 Type 2 diabetes mellitus with diabetic chronic kidney disease: Secondary | ICD-10-CM | POA: Diagnosis present

## 2015-03-08 DIAGNOSIS — I5043 Acute on chronic combined systolic (congestive) and diastolic (congestive) heart failure: Secondary | ICD-10-CM | POA: Diagnosis present

## 2015-03-08 DIAGNOSIS — I509 Heart failure, unspecified: Secondary | ICD-10-CM | POA: Diagnosis not present

## 2015-03-08 HISTORY — DX: Heart failure, unspecified: I50.9

## 2015-03-08 LAB — CBC WITH DIFFERENTIAL/PLATELET
Basophils Absolute: 0 10*3/uL (ref 0.0–0.1)
Basophils Relative: 1 %
EOS ABS: 0.1 10*3/uL (ref 0.0–0.7)
EOS PCT: 2 %
HCT: 34 % — ABNORMAL LOW (ref 39.0–52.0)
Hemoglobin: 11.4 g/dL — ABNORMAL LOW (ref 13.0–17.0)
LYMPHS ABS: 0.8 10*3/uL (ref 0.7–4.0)
Lymphocytes Relative: 19 %
MCH: 29.2 pg (ref 26.0–34.0)
MCHC: 33.5 g/dL (ref 30.0–36.0)
MCV: 87.2 fL (ref 78.0–100.0)
Monocytes Absolute: 0.4 10*3/uL (ref 0.1–1.0)
Monocytes Relative: 10 %
Neutro Abs: 2.8 10*3/uL (ref 1.7–7.7)
Neutrophils Relative %: 68 %
PLATELETS: 171 10*3/uL (ref 150–400)
RBC: 3.9 MIL/uL — AB (ref 4.22–5.81)
RDW: 15.6 % — ABNORMAL HIGH (ref 11.5–15.5)
WBC: 4.1 10*3/uL (ref 4.0–10.5)

## 2015-03-08 LAB — COMPREHENSIVE METABOLIC PANEL
ALT: 22 U/L (ref 17–63)
ANION GAP: 16 — AB (ref 5–15)
AST: 22 U/L (ref 15–41)
Albumin: 3.5 g/dL (ref 3.5–5.0)
Alkaline Phosphatase: 75 U/L (ref 38–126)
BUN: 100 mg/dL — ABNORMAL HIGH (ref 6–20)
CHLORIDE: 100 mmol/L — AB (ref 101–111)
CO2: 23 mmol/L (ref 22–32)
Calcium: 8.7 mg/dL — ABNORMAL LOW (ref 8.9–10.3)
Creatinine, Ser: 5.94 mg/dL — ABNORMAL HIGH (ref 0.61–1.24)
GFR calc non Af Amer: 9 mL/min — ABNORMAL LOW (ref >60)
GFR, EST AFRICAN AMERICAN: 10 mL/min — AB (ref >60)
Glucose, Bld: 112 mg/dL — ABNORMAL HIGH (ref 65–99)
Potassium: 3.3 mmol/L — ABNORMAL LOW (ref 3.5–5.1)
SODIUM: 139 mmol/L (ref 135–145)
Total Bilirubin: 0.6 mg/dL (ref 0.3–1.2)
Total Protein: 6 g/dL — ABNORMAL LOW (ref 6.5–8.1)

## 2015-03-08 LAB — TROPONIN I: Troponin I: 0.1 ng/mL — ABNORMAL HIGH (ref <0.031)

## 2015-03-08 LAB — CBG MONITORING, ED: GLUCOSE-CAPILLARY: 94 mg/dL (ref 65–99)

## 2015-03-08 LAB — BRAIN NATRIURETIC PEPTIDE

## 2015-03-08 MED ORDER — INSULIN ASPART 100 UNIT/ML ~~LOC~~ SOLN: 0.0000 [IU] | Freq: Every day | SUBCUTANEOUS | Status: DC

## 2015-03-08 MED ORDER — SODIUM CHLORIDE 0.9 % IV SOLN: 250.0000 mL | INTRAVENOUS | Status: DC | PRN

## 2015-03-08 MED ORDER — ASPIRIN EC 81 MG PO TBEC
81.0000 mg | DELAYED_RELEASE_TABLET | Freq: Every day | ORAL | Status: DC
  Administered 2015-03-09 – 2015-03-10 (×2): 81 mg via ORAL
  Filled 2015-03-08 (×2): qty 1

## 2015-03-08 MED ORDER — SODIUM CHLORIDE 0.9% FLUSH
3.0000 mL | Freq: Two times a day (BID) | INTRAVENOUS | Status: DC
  Administered 2015-03-10: 3 mL via INTRAVENOUS

## 2015-03-08 MED ORDER — FUROSEMIDE 10 MG/ML IJ SOLN
80.0000 mg | Freq: Two times a day (BID) | INTRAMUSCULAR | Status: DC
  Administered 2015-03-09 – 2015-03-10 (×3): 80 mg via INTRAVENOUS
  Filled 2015-03-08 (×4): qty 8

## 2015-03-08 MED ORDER — ONDANSETRON HCL 4 MG/2ML IJ SOLN
4.0000 mg | Freq: Four times a day (QID) | INTRAMUSCULAR | Status: DC | PRN
  Administered 2015-03-09: 4 mg via INTRAVENOUS
  Filled 2015-03-08: qty 2

## 2015-03-08 MED ORDER — INSULIN ASPART 100 UNIT/ML ~~LOC~~ SOLN: 0.0000 [IU] | Freq: Three times a day (TID) | SUBCUTANEOUS | Status: DC

## 2015-03-08 MED ORDER — HYDROMORPHONE HCL 1 MG/ML IJ SOLN: 0.5000 mg | INTRAMUSCULAR | Status: DC | PRN

## 2015-03-08 MED ORDER — SODIUM CHLORIDE 0.9% FLUSH: 3.0000 mL | Freq: Two times a day (BID) | INTRAVENOUS | Status: DC

## 2015-03-08 MED ORDER — SODIUM CHLORIDE 0.9% FLUSH: 3.0000 mL | INTRAVENOUS | Status: DC | PRN

## 2015-03-08 MED ORDER — OXYCODONE HCL 5 MG PO TABS
5.0000 mg | ORAL_TABLET | ORAL | Status: DC | PRN
  Administered 2015-03-10: 5 mg via ORAL
  Filled 2015-03-08: qty 1

## 2015-03-08 MED ORDER — CLONIDINE HCL 0.2 MG PO TABS
0.3000 mg | ORAL_TABLET | Freq: Every day | ORAL | Status: DC
  Administered 2015-03-09 – 2015-03-10 (×2): 0.3 mg via ORAL
  Filled 2015-03-08 (×2): qty 1

## 2015-03-08 MED ORDER — CALCITRIOL 0.25 MCG PO CAPS
0.2500 ug | ORAL_CAPSULE | Freq: Every day | ORAL | Status: DC
  Administered 2015-03-09 – 2015-03-10 (×2): 0.25 ug via ORAL
  Filled 2015-03-08 (×2): qty 1

## 2015-03-08 MED ORDER — HYDRALAZINE HCL 25 MG PO TABS
100.0000 mg | ORAL_TABLET | Freq: Two times a day (BID) | ORAL | Status: DC
  Administered 2015-03-09 – 2015-03-10 (×3): 100 mg via ORAL
  Filled 2015-03-08: qty 4
  Filled 2015-03-08: qty 2
  Filled 2015-03-08 (×2): qty 4
  Filled 2015-03-08: qty 2

## 2015-03-08 MED ORDER — ENOXAPARIN SODIUM 30 MG/0.3ML ~~LOC~~ SOLN
30.0000 mg | SUBCUTANEOUS | Status: DC
  Administered 2015-03-09 (×2): 30 mg via SUBCUTANEOUS
  Filled 2015-03-08 (×2): qty 0.3

## 2015-03-08 MED ORDER — ONDANSETRON HCL 4 MG PO TABS: 4.0000 mg | ORAL_TABLET | Freq: Four times a day (QID) | ORAL | Status: DC | PRN

## 2015-03-08 MED ORDER — CALCIUM ACETATE (PHOS BINDER) 667 MG PO CAPS
667.0000 mg | ORAL_CAPSULE | Freq: Every day | ORAL | Status: DC
  Administered 2015-03-09: 667 mg via ORAL
  Filled 2015-03-08: qty 1

## 2015-03-08 MED ORDER — SODIUM CHLORIDE 0.9% FLUSH
3.0000 mL | Freq: Two times a day (BID) | INTRAVENOUS | Status: DC
  Administered 2015-03-08 – 2015-03-09 (×3): 3 mL via INTRAVENOUS

## 2015-03-08 MED ORDER — ACETAMINOPHEN 650 MG RE SUPP: 650.0000 mg | Freq: Four times a day (QID) | RECTAL | Status: DC | PRN

## 2015-03-08 MED ORDER — FUROSEMIDE 10 MG/ML IJ SOLN
80.0000 mg | Freq: Once | INTRAMUSCULAR | Status: AC
  Administered 2015-03-08: 80 mg via INTRAVENOUS
  Filled 2015-03-08: qty 8

## 2015-03-08 MED ORDER — POTASSIUM CHLORIDE CRYS ER 20 MEQ PO TBCR
40.0000 meq | EXTENDED_RELEASE_TABLET | Freq: Once | ORAL | Status: AC
  Administered 2015-03-08: 40 meq via ORAL

## 2015-03-08 MED ORDER — ALUM & MAG HYDROXIDE-SIMETH 200-200-20 MG/5ML PO SUSP: 30.0000 mL | Freq: Four times a day (QID) | ORAL | Status: DC | PRN

## 2015-03-08 MED ORDER — INSULIN GLARGINE 100 UNIT/ML ~~LOC~~ SOLN
30.0000 [IU] | Freq: Every morning | SUBCUTANEOUS | Status: DC
  Administered 2015-03-09 – 2015-03-10 (×2): 30 [IU] via SUBCUTANEOUS
  Filled 2015-03-08 (×3): qty 0.3

## 2015-03-08 MED ORDER — POTASSIUM CHLORIDE CRYS ER 20 MEQ PO TBCR
20.0000 meq | EXTENDED_RELEASE_TABLET | Freq: Two times a day (BID) | ORAL | Status: DC
  Administered 2015-03-09 – 2015-03-10 (×3): 20 meq via ORAL
  Filled 2015-03-08 (×4): qty 1

## 2015-03-08 MED ORDER — METOPROLOL SUCCINATE ER 25 MG PO TB24
25.0000 mg | ORAL_TABLET | Freq: Every day | ORAL | Status: DC
  Administered 2015-03-09 – 2015-03-10 (×2): 25 mg via ORAL
  Filled 2015-03-08 (×2): qty 1

## 2015-03-08 MED ORDER — ACETAMINOPHEN 325 MG PO TABS: 650.0000 mg | ORAL_TABLET | Freq: Four times a day (QID) | ORAL | Status: DC | PRN

## 2015-03-08 NOTE — ED Provider Notes (Signed)
CSN: [id number]     Arrival date & time 03/08/15  1805 History   First MD Initiated Contact with Patient 03/08/15 1904     Chief Complaint  Patient presents with  . Shortness of Breath     (Consider location/radiation/quality/duration/timing/severity/associated sxs/prior Treatment) Patient is a 70 y.o. male presenting with shortness of breath. The history is provided by the patient (Patient states that he's been short of breath last 3 days that he cannot lay flat. He's gained 6 pounds and has swelling in his ankles).  Shortness of Breath Severity:  Moderate Onset quality:  Sudden Timing:  Constant Progression:  Waxing and waning Chronicity:  Recurrent Context: activity   Associated symptoms: no abdominal pain, no chest pain, no cough, no headaches and no rash     Past Medical History  Diagnosis Date  . Chronic combined systolic and diastolic CHF (congestive heart failure) ([place])     a. 02/2014 Echo: EF 20-25% (new), Gr 2 DD, mod conc LVH, mild AI/AS, mod MR, sev dil LA, mild TR.  Marland Kitchen Nonischemic cardiomyopathy ([place])     a. 02/2014 Echo: EF 20-25%;  b. 03/2014 Cath: LM nl, LAD min irregs, LCX   . Hypertension   . Chronic kidney disease (CKD), stage IV (severe) ([place]) 12/2009    a. baseline creat now ~ 4.  . Erectile dysfunction   . Colonic polyp   . Low serum testosterone level   . Tobacco abuse   . Benign prostatic hypertrophy   . Obesity, Class I, BMI 30-34.9   . Hyperlipidemia   . Adenomatous colon polyp 2006  . H. pylori infection 2013    treated with prevpac  . Mild Ao Stenosis and Insufficiency   . AICD (automatic cardioverter/defibrillator) present     STJ device, implanted 12/03/14 Dr. Lovena Le  . Sleep apnea 2010    Initially unable to afford CPAP  . 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 Gout   . CHF (congestive heart failure) [facility])    Past Surgical History  Procedure Laterality Date  . Retinal laser procedure      diabetic retinopathy   . Lymph node biopsy      surgical exploration of neck-not entirely clear that this represented a lymph node biopsy  . Colonoscopy  08/23/2004    Dr. Vivi Ferns rectum, diminutive polyp of the rectosigmoid removed, inflamed focally adenomatous polyp  . Left and right heart catheterization with coronary angiogram N/A 03/19/2014    Procedure: LEFT AND RIGHT HEART CATHETERIZATION WITH CORONARY ANGIOGRAM;  Surgeon: Blane Ohara, MD;  Location: [facility] CATH LAB;  Service: Cardiovascular;  Laterality: N/A;  . Cardiac catheterization    . Ep implantable device N/A 12/03/2014    Procedure: ICD Implant;  Surgeon: Evans Lance, MD;  Location: [place] CV LAB;  Service: Cardiovascular;  Laterality: N/A;; St Jude  . Esophagogastroduodenoscopy  2013    Dr. Gala Romney: erosions reminiscent of GAVE but biopsies showed H.pylori gastritis  . Colonoscopy  2013    Dr. Gala Romney: multiple diminutive polyps in distal sigmoid segment, 4 mm pedunculated polyp at hepatic flexure. Tubular adenomas. Surveillance due 2018   . Cholecystectomy N/A 01/07/2015    Procedure: LAPAROSCOPIC CHOLECYSTECTOMY WITH INTRAOPERATIVE CHOLANGIOGRAM;  Surgeon: Donnie Mesa, MD;  Location: [place];  Service: General;  Laterality: N/A;  . Umbilical hernia repair N/A 01/07/2015    Procedure: HERNIA REPAIR UMBILICAL ADULT;  Surgeon: Donnie Mesa, MD;  Location: [place];  Service:  General;  Laterality: N/A;  . Ercp N/A 01/09/2015    Procedure: ENDOSCOPIC RETROGRADE CHOLANGIOPANCREATOGRAPHY (ERCP);  Surgeon: Clarene Essex, MD;  Location: [facility] ENDOSCOPY;  Service: Endoscopy;  Laterality: N/A;   Family History  Problem Relation Age of Onset  . Hypertension Mother   . Cancer Mother   . Cancer Father   . Diabetes Sister    Social History  Substance Use Topics  . Smoking status: Current Some Day Smoker -- 0.50 packs/day for 56 years    Types: Cigarettes    Start date: 05/31/1958  . Smokeless tobacco: Never Used  . Alcohol Use: No    Review of  Systems  Constitutional: Negative for appetite change and fatigue.  HENT: Negative for congestion, ear discharge and sinus pressure.   Eyes: Negative for discharge.  Respiratory: Positive for shortness of breath. Negative for cough.   Cardiovascular: Negative for chest pain.  Gastrointestinal: Negative for abdominal pain and diarrhea.  Genitourinary: Negative for frequency and hematuria.  Musculoskeletal: Negative for back pain.  Skin: Negative for rash.  Neurological: Negative for seizures and headaches.  Psychiatric/Behavioral: Negative for hallucinations.      Allergies  Ace inhibitors  Home Medications   Prior to Admission medications   Medication Sig Start Date End Date Taking? Authorizing Provider  albuterol (PROVENTIL HFA;VENTOLIN HFA) 108 (90 Base) MCG/ACT inhaler Inhale 2 puffs into the lungs every 6 (six) hours as needed for wheezing or shortness of breath. 01/16/15  Yes Susy Frizzle, MD  albuterol (PROVENTIL) (2.5 MG/3ML) 0.083% nebulizer solution Take 3 mLs (2.5 mg total) by nebulization every 6 (six) hours as needed for wheezing. 01/30/15  Yes Susy Frizzle, MD  aspirin EC 81 MG tablet Take 81 mg by mouth daily.   Yes Historical Provider, MD  calcitRIOL (ROCALTROL) 0.25 MCG capsule Take 1 capsule (0.25 mcg total) by mouth daily. 01/11/15  Yes Annita Brod, MD  calcium acetate (PHOSLO) 667 MG capsule Take 1 capsule (667 mg total) by mouth 3 (three) times daily with meals. Patient taking differently: Take 667 mg by mouth daily with supper. Takes once daily with largest meal 01/11/15  Yes Annita Brod, MD  cloNIDine (CATAPRES) 0.3 MG tablet Take 1 tablet (0.3 mg total) by mouth daily. 01/08/15  Yes Susy Frizzle, MD  hydrALAZINE (APRESOLINE) 100 MG tablet Take 1 tablet (100 mg total) by mouth 2 (two) times daily. 01/11/15  Yes Annita Brod, MD  Insulin Glargine (LANTUS SOLOSTAR) 100 UNIT/ML Solostar Pen Inject 30 Units into the skin daily at 10  pm. Patient taking differently: Inject 30 Units into the skin every morning.  02/24/15  Yes Susy Frizzle, MD  metolazone (ZAROXOLYN) 5 MG tablet Take 1 tablet (5 mg total) by mouth daily. 03/05/15  Yes [name], MD  metoprolol succinate (TOPROL-XL) 25 MG 24 hr tablet Take 1 tablet (25 mg total) by mouth daily. 01/08/15  Yes Susy Frizzle, MD  torsemide (DEMADEX) 100 MG tablet Take 1.5 tablets (150 mg total) by mouth 2 (two) times daily. 03/02/15  Yes Susy Frizzle, MD  ONE TOUCH ULTRA TEST test strip Check blood sugar each morning fasting and once daily 2 hours after a meal 12/10/14   Susy Frizzle, MD  oxyCODONE (OXY IR/ROXICODONE) 5 MG immediate release tablet Take 1-2 tablets (5-10 mg total) by mouth every 4 (four) hours as needed for moderate pain, severe pain or breakthrough pain. Patient not taking: Reported on 03/05/2015 01/16/15   Susy Frizzle,  MD   BP 163/64 mmHg  Pulse 80  Temp(Src) 98 F (36.7 C) (Oral)  Resp 22  Ht 5\' 9"  (1.753 m)  Wt 200 lb (90.719 kg)  BMI 29.52 kg/m2  SpO2 94% Physical Exam  Constitutional: He is oriented to person, place, and time. He appears well-developed.  HENT:  Head: Normocephalic.  Eyes: Conjunctivae and EOM are normal. No scleral icterus.  Neck: Neck supple. No thyromegaly present.  Cardiovascular: Normal rate and regular rhythm.  Exam reveals no gallop and no friction rub.   No murmur heard. Pulmonary/Chest: No stridor. He has no wheezes. He has no rales. He exhibits no tenderness.  Abdominal: He exhibits no distension. There is no tenderness. There is no rebound.  Musculoskeletal: Normal range of motion. He exhibits edema.  Lymphadenopathy:    He has no cervical adenopathy.  Neurological: He is oriented to person, place, and time. He exhibits normal muscle tone. Coordination normal.  Skin: No rash noted. No erythema.  Psychiatric: He has a normal mood and affect. His behavior is normal.    ED Course  Procedures (including  critical care time) Labs Review Labs Reviewed  CBC WITH DIFFERENTIAL/PLATELET - Abnormal; Notable for the following:    RBC 3.90 (*)    Hemoglobin 11.4 (*)    HCT 34.0 (*)    RDW 15.6 (*)    All other components within normal limits  COMPREHENSIVE METABOLIC PANEL - Abnormal; Notable for the following:    Potassium 3.3 (*)    Chloride 100 (*)    Glucose, Bld 112 (*)    BUN 100 (*)    Creatinine, Ser 5.94 (*)    Calcium 8.7 (*)    Total Protein 6.0 (*)    GFR calc non Af Amer 9 (*)    GFR calc Af Amer 10 (*)    Anion gap 16 (*)    All other components within normal limits  TROPONIN I - Abnormal; Notable for the following:    Troponin I 0.10 (*)    All other components within normal limits  BRAIN NATRIURETIC PEPTIDE - Abnormal; Notable for the following:    B Natriuretic Peptide >4500.0 (*)    All other components within normal limits  CBG MONITORING, ED    Imaging Review Dg Chest 2 View  03/08/2015  CLINICAL DATA:  Shortness of breath with bilateral lower extremity swelling. Worse recently. EXAM: CHEST  2 VIEW COMPARISON:  03/05/2015 and 02/26/2015 FINDINGS: Left-sided pacemaker unchanged. Lungs are adequately inflated with small amount right pleural fluid. Likely associated atelectasis in the right base. Mild prominence of the perihilar markings suggesting mild vascular congestion. Mild stable cardiomegaly. Remainder of the exam is unchanged. IMPRESSION: Mild stable cardiomegaly with findings suggesting mild vascular congestion. Small amount right pleural fluid. Likely associated minimal right basilar atelectasis. Electronically Signed   By: Marin Olp M.D.   On: 03/08/2015 18:48   I have personally reviewed and evaluated these images and lab results as part of my medical decision-making.   EKG Interpretation   Date/Time:  Sunday March 08 2015 18:18:13 EST Ventricular Rate:  83 PR Interval:  150 QRS Duration: 128 QT Interval:  422 QTC Calculation: 495 R Axis:    3 Text Interpretation:  Normal sinus rhythm Possible Left atrial enlargement  Left bundle branch block Abnormal ECG Confirmed by Alica Shellhammer  MD, [place]field Dann  [phone]) on 03/08/2015 9:34:00 PM      MDM   Final diagnoses:  CHF (congestive heart failure), NYHA class I,  unspecified failure chronicity, systolic (HCC)    Patient with moderate congestive heart failure. He will be admitted to medicine for diuresis    Milton Ferguson, MD 03/08/15 2206

## 2015-03-08 NOTE — H&P (Addendum)
[facility] Admission History and Physical       [name] [id number] DOB: 1945-01-31 DOA: 03/08/2015  Referring physician: EDP PCP: Odette Fraction, MD  Specialists:   Chief Complaint: SOB and Increased Swelling of Both Legs  HPI: [name] is a 70 y.o. male with a history of Systolic CHF (EF [id number]) , HTN, Stage IV CKD who presents to the ED with complaints of worsening SOB, Orthopnea and Edema of both lower legs x 2-3 days.   He also reports a weight gain of 6 pounds over the past 3 days.   He denies any chest pain.  He was evaluated in the ED and was found to have a BNP > 4500, and a chest X-ray revealing mild CHF .   He was administered 80 of IV Lasix in the ED and referred for admission.     Review of Systems:    Constitutional:  +Weight Gain, Night Sweats, Fevers, Chills, Dizziness, Light Headedness, Fatigue, or Generalized Weakness HEENT: No Headaches, Difficulty Swallowing,Tooth/Dental Problems,Sore Throat,  No Sneezing, Rhinitis, Ear Ache, Nasal Congestion, or Post Nasal Drip,  Cardio-vascular:  No Chest pain, +Orthopnea, PND, +Edema in Lower Extremities, Anasarca, Dizziness, Palpitations  Resp: +Dyspnea, No DOE, No Productive Cough, No Non-Productive Cough, No Hemoptysis, No Wheezing.    GI: No Heartburn, Indigestion, Abdominal Pain, Nausea, Vomiting, Diarrhea, Constipation, Hematemesis, Hematochezia, Melena, Change in Bowel Habits,  Loss of Appetite  GU: No Dysuria, No Change in Color of Urine, No Urgency or Urinary Frequency, No Flank pain.  Musculoskeletal: No Joint Pain or Swelling, No Decreased Range of Motion, No Back Pain.  Neurologic: No Syncope, No Seizures, Muscle Weakness, Paresthesia, Vision Disturbance or Loss, No Diplopia, No Vertigo, No Difficulty Walking,  Skin: No Rash or Lesions. Psych: No Change in Mood or Affect, No Depression or Anxiety, No Memory loss, No Confusion, or Hallucinations   Past Medical History  Diagnosis Date  .  Chronic combined systolic and diastolic CHF (congestive heart failure) ([place])     a. 02/2014 Echo: EF 20-25% (new), Gr 2 DD, mod conc LVH, mild AI/AS, mod MR, sev dil LA, mild TR.  Marland Kitchen Nonischemic cardiomyopathy ([place])     a. 02/2014 Echo: EF 20-25%;  b. 03/2014 Cath: LM nl, LAD min irregs, LCX   . Hypertension   . Chronic kidney disease (CKD), stage IV (severe) ([place]) 12/2009    a. baseline creat now ~ 4.  . Erectile dysfunction   . Colonic polyp   . Low serum testosterone level   . Tobacco abuse   . Benign prostatic hypertrophy   . Obesity, Class I, BMI 30-34.9   . Hyperlipidemia   . Adenomatous colon polyp 2006  . H. pylori infection 2013    treated with prevpac  . Mild Ao Stenosis and Insufficiency   . AICD (automatic cardioverter/defibrillator) present     STJ device, implanted 12/03/14 Dr. Lovena Le  . Sleep apnea 2010    Initially unable to afford CPAP  . 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 Gout   . CHF (congestive heart failure) [facility])      Past Surgical History  Procedure Laterality Date  . Retinal laser procedure      diabetic retinopathy  . Lymph node biopsy      surgical exploration of neck-not entirely clear that this represented a lymph node biopsy  . Colonoscopy  08/23/2004    Dr. Vivi Ferns rectum, diminutive polyp of the  rectosigmoid removed, inflamed focally adenomatous polyp  . Left and right heart catheterization with coronary angiogram N/A 03/19/2014    Procedure: LEFT AND RIGHT HEART CATHETERIZATION WITH CORONARY ANGIOGRAM;  Surgeon: Blane Ohara, MD;  Location: [facility] CATH LAB;  Service: Cardiovascular;  Laterality: N/A;  . Cardiac catheterization    . Ep implantable device N/A 12/03/2014    Procedure: ICD Implant;  Surgeon: Evans Lance, MD;  Location: [place] CV LAB;  Service: Cardiovascular;  Laterality: N/A;; St Jude  . Esophagogastroduodenoscopy  2013    Dr. Gala Romney: erosions reminiscent of GAVE but biopsies showed H.pylori  gastritis  . Colonoscopy  2013    Dr. Gala Romney: multiple diminutive polyps in distal sigmoid segment, 4 mm pedunculated polyp at hepatic flexure. Tubular adenomas. Surveillance due 2018   . Cholecystectomy N/A 01/07/2015    Procedure: LAPAROSCOPIC CHOLECYSTECTOMY WITH INTRAOPERATIVE CHOLANGIOGRAM;  Surgeon: Donnie Mesa, MD;  Location: [place];  Service: General;  Laterality: N/A;  . Umbilical hernia repair N/A 01/07/2015    Procedure: HERNIA REPAIR UMBILICAL ADULT;  Surgeon: Donnie Mesa, MD;  Location: [place];  Service: General;  Laterality: N/A;  . Ercp N/A 01/09/2015    Procedure: ENDOSCOPIC RETROGRADE CHOLANGIOPANCREATOGRAPHY (ERCP);  Surgeon: Clarene Essex, MD;  Location: [facility] ENDOSCOPY;  Service: Endoscopy;  Laterality: N/A;      Prior to Admission medications   Medication Sig Start Date End Date Taking? Authorizing Provider  albuterol (PROVENTIL HFA;VENTOLIN HFA) 108 (90 Base) MCG/ACT inhaler Inhale 2 puffs into the lungs every 6 (six) hours as needed for wheezing or shortness of breath. 01/16/15  Yes Susy Frizzle, MD  albuterol (PROVENTIL) (2.5 MG/3ML) 0.083% nebulizer solution Take 3 mLs (2.5 mg total) by nebulization every 6 (six) hours as needed for wheezing. 01/30/15  Yes Susy Frizzle, MD  aspirin EC 81 MG tablet Take 81 mg by mouth daily.   Yes Historical Provider, MD  calcitRIOL (ROCALTROL) 0.25 MCG capsule Take 1 capsule (0.25 mcg total) by mouth daily. 01/11/15  Yes Annita Brod, MD  calcium acetate (PHOSLO) 667 MG capsule Take 1 capsule (667 mg total) by mouth 3 (three) times daily with meals. Patient taking differently: Take 667 mg by mouth daily with supper. Takes once daily with largest meal 01/11/15  Yes Annita Brod, MD  cloNIDine (CATAPRES) 0.3 MG tablet Take 1 tablet (0.3 mg total) by mouth daily. 01/08/15  Yes Susy Frizzle, MD  hydrALAZINE (APRESOLINE) 100 MG tablet Take 1 tablet (100 mg total) by mouth 2 (two) times daily. 01/11/15  Yes Annita Brod,  MD  Insulin Glargine (LANTUS SOLOSTAR) 100 UNIT/ML Solostar Pen Inject 30 Units into the skin daily at 10 pm. Patient taking differently: Inject 30 Units into the skin every morning.  02/24/15  Yes Susy Frizzle, MD  metolazone (ZAROXOLYN) 5 MG tablet Take 1 tablet (5 mg total) by mouth daily. 03/05/15  Yes [name], MD  metoprolol succinate (TOPROL-XL) 25 MG 24 hr tablet Take 1 tablet (25 mg total) by mouth daily. 01/08/15  Yes Susy Frizzle, MD  torsemide (DEMADEX) 100 MG tablet Take 1.5 tablets (150 mg total) by mouth 2 (two) times daily. 03/02/15  Yes Susy Frizzle, MD  ONE TOUCH ULTRA TEST test strip Check blood sugar each morning fasting and once daily 2 hours after a meal 12/10/14   Susy Frizzle, MD  oxyCODONE (OXY IR/ROXICODONE) 5 MG immediate release tablet Take 1-2 tablets (5-10 mg total) by mouth every 4 (four) hours  as needed for moderate pain, severe pain or breakthrough pain. Patient not taking: Reported on 03/05/2015 01/16/15   Susy Frizzle, MD     Allergies  Allergen Reactions  . Ace Inhibitors Cough    Social History:  reports that he has been smoking Cigarettes.  He started smoking about 56 years ago. He has a 28 pack-year smoking history. He has never used smokeless tobacco. He reports that he does not drink alcohol or use illicit drugs.    Family History  Problem Relation Age of Onset  . Hypertension Mother   . Cancer Mother   . Cancer Father   . Diabetes Sister        Physical Exam:  GEN:    Pleasant Obese Elderly  70 y.o.  African American male examined and in no acute distress; cooperative with exam Filed Vitals:   03/08/15 2100 03/08/15 2115 03/08/15 2151 03/08/15 2154  BP:    163/64  Pulse: 80 79 80 80  Temp:      TempSrc:      Resp:      Height:      Weight:      SpO2: 94% 95% 94%    Blood pressure 163/64, pulse 80, temperature 98 F (36.7 C), temperature source Oral, resp. rate 22, height 5\' 9"  (1.753 m), weight 90.719 kg (200  lb), SpO2 94 %. PSYCH: He is alert and oriented x4; does not appear anxious does not appear depressed; affect is normal HEENT: Normocephalic and Atraumatic, Mucous membranes pink; PERRLA; EOM intact; Fundi:  Benign;  No scleral icterus, Nares: Patent, Oropharynx: Clear,    Neck:  FROM, No Cervical Lymphadenopathy nor Thyromegaly or Carotid Bruit; No JVD; Breasts:: Not examined CHEST WALL: No tenderness CHEST: Normal respiration, clear to auscultation bilaterally HEART: Regular rate and rhythm; no murmurs rubs or gallops BACK: No kyphosis or scoliosis; No CVA tenderness ABDOMEN: Positive Bowel Sounds, Obese, Soft Non-Tender, No Rebound or Guarding; No Masses, No Organomegaly Rectal Exam: Not done EXTREMITIES: No Cyanosis, Clubbing, 3+ BLE Pretibial Edema; No Ulcerations. Genitalia: not examined PULSES: 2+ and symmetric SKIN: Normal hydration no rash or ulceration CNS:  Alert and Oriented x 4, No Focal Deficits Vascular: pulses palpable throughout    Labs on Admission:  Basic Metabolic Panel:  Recent Labs Lab 03/05/15 1103 03/08/15 1946  NA 141 139  K 3.6 3.3*  CL 101 100*  CO2 27 23  GLUCOSE 175* 112*  BUN 98* 100*  CREATININE 5.82* 5.94*  CALCIUM 8.4* 8.7*   Liver Function Tests:  Recent Labs Lab 03/08/15 1946  AST 22  ALT 22  ALKPHOS 75  BILITOT 0.6  PROT 6.0*  ALBUMIN 3.5   No results for input(s): LIPASE, AMYLASE in the last 168 hours. No results for input(s): AMMONIA in the last 168 hours. CBC:  Recent Labs Lab 03/05/15 1103 03/08/15 1946  WBC 4.6 4.1  NEUTROABS 3.3 2.8  HGB 9.8* 11.4*  HCT 29.1* 34.0*  MCV 88.4 87.2  PLT 146* 171   Cardiac Enzymes:  Recent Labs Lab 03/05/15 1103 03/08/15 1946  TROPONINI 0.12* 0.10*    BNP (last 3 results)  Recent Labs  01/28/15 2135 03/05/15 1104 03/08/15 1946  BNP >4500.0* >4500.0* >4500.0*    ProBNP (last 3 results) No results for input(s): PROBNP in the last 8760 hours.  CBG:  Recent  Labs Lab 03/08/15 2124  GLUCAP 94    Radiological Exams on Admission: Dg Chest 2 View  03/08/2015  CLINICAL DATA:  Shortness of breath with bilateral lower extremity swelling. Worse recently. EXAM: CHEST  2 VIEW COMPARISON:  03/05/2015 and 02/26/2015 FINDINGS: Left-sided pacemaker unchanged. Lungs are adequately inflated with small amount right pleural fluid. Likely associated atelectasis in the right base. Mild prominence of the perihilar markings suggesting mild vascular congestion. Mild stable cardiomegaly. Remainder of the exam is unchanged. IMPRESSION: Mild stable cardiomegaly with findings suggesting mild vascular congestion. Small amount right pleural fluid. Likely associated minimal right basilar atelectasis. Electronically Signed   By: Marin Olp M.D.   On: 03/08/2015 18:48     EKG: Independently reviewed.      Assessment/Plan:      70 y.o. male with  Active Problems:       1.     Acute systolic CHF (congestive heart failure) (HCC)    IV Lasix 80 mg BID x 4 doses    2D ECHO in AM    Monitor I/Os    2.     Hypokalemia    Replace K+    Check Magnesium       3.     Diabetes mellitus type 2 with retinopathy ([place])    Contnue Lantus Rx as Glucose tolerates    SSI coverage PRN    Check HbA1C in AM      4.     Essential hypertension    Continue Clonidine, Hydralazine  and Metoprolol RX as BP tolerates    Monitor BPs      5.     Chronic kidney disease, stage 4, severely decreased GFR (HCC)    Monitor BUN/Cr      6.     DVT Prophylaxis    Lovenox    Code Status:     FULL CODE       Family Communication:   Wife at Bedside Disposition Plan:    Inpatient Status        Time spent: 30 Minutes      Theressa Millard [facility] Pager [phone]   If Nellie Please Contact the Day Rounding Team MD for [facility]  If 7PM-7AM, Please Contact Night-Floor Coverage  [email or web address] Password [email or web address] 03/08/2015, 10:09 PM     ADDENDUM:   Patient was  seen and examined on 03/08/2015

## 2015-03-08 NOTE — ED Notes (Signed)
Lasix to be started tomorrow morning per Dr. Tresa Garter.

## 2015-03-08 NOTE — ED Notes (Addendum)
Patient c/o shortness of breath and swelling in legs bilaterally. Per wife patient seen here in ER for same reason on Thursday, given Lasix, and discharged. Patient had improvement until Saturday. Denies any chest pain. Patient does have hx of CHF. Patient reports using nebulizer and inhaler at home with no improvement.

## 2015-03-09 ENCOUNTER — Inpatient Hospital Stay (HOSPITAL_COMMUNITY): Payer: Medicare Other

## 2015-03-09 DIAGNOSIS — E876 Hypokalemia: Secondary | ICD-10-CM

## 2015-03-09 DIAGNOSIS — I5021 Acute systolic (congestive) heart failure: Secondary | ICD-10-CM

## 2015-03-09 DIAGNOSIS — I509 Heart failure, unspecified: Secondary | ICD-10-CM

## 2015-03-09 DIAGNOSIS — N184 Chronic kidney disease, stage 4 (severe): Secondary | ICD-10-CM

## 2015-03-09 DIAGNOSIS — I10 Essential (primary) hypertension: Secondary | ICD-10-CM

## 2015-03-09 LAB — CBC
HEMATOCRIT: 35.8 % — AB (ref 39.0–52.0)
HEMOGLOBIN: 11.9 g/dL — AB (ref 13.0–17.0)
MCH: 29.1 pg (ref 26.0–34.0)
MCHC: 33.2 g/dL (ref 30.0–36.0)
MCV: 87.5 fL (ref 78.0–100.0)
Platelets: 174 10*3/uL (ref 150–400)
RBC: 4.09 MIL/uL — ABNORMAL LOW (ref 4.22–5.81)
RDW: 15.7 % — ABNORMAL HIGH (ref 11.5–15.5)
WBC: 4.3 10*3/uL (ref 4.0–10.5)

## 2015-03-09 LAB — BASIC METABOLIC PANEL
Anion gap: 18 — ABNORMAL HIGH (ref 5–15)
BUN: 105 mg/dL — AB (ref 6–20)
CHLORIDE: 101 mmol/L (ref 101–111)
CO2: 23 mmol/L (ref 22–32)
Calcium: 9 mg/dL (ref 8.9–10.3)
Creatinine, Ser: 6.14 mg/dL — ABNORMAL HIGH (ref 0.61–1.24)
GFR calc Af Amer: 10 mL/min — ABNORMAL LOW (ref >60)
GFR calc non Af Amer: 8 mL/min — ABNORMAL LOW (ref >60)
GLUCOSE: 120 mg/dL — AB (ref 65–99)
POTASSIUM: 3.6 mmol/L (ref 3.5–5.1)
Sodium: 142 mmol/L (ref 135–145)

## 2015-03-09 LAB — GLUCOSE, CAPILLARY
GLUCOSE-CAPILLARY: 104 mg/dL — AB (ref 65–99)
GLUCOSE-CAPILLARY: 106 mg/dL — AB (ref 65–99)
Glucose-Capillary: 116 mg/dL — ABNORMAL HIGH (ref 65–99)
Glucose-Capillary: 150 mg/dL — ABNORMAL HIGH (ref 65–99)

## 2015-03-09 LAB — MRSA PCR SCREENING: MRSA by PCR: NEGATIVE

## 2015-03-09 MED ORDER — ALBUTEROL SULFATE (2.5 MG/3ML) 0.083% IN NEBU: 2.5000 mg | INHALATION_SOLUTION | Freq: Four times a day (QID) | RESPIRATORY_TRACT | Status: DC | PRN

## 2015-03-09 NOTE — Progress Notes (Signed)
[facility] PROGRESS NOTE  [name] [id number] DOB: [birth year]    PCP:   Odette Fraction, MD   HPI: [name] is an 70 y.o. male with hx of CKD5, non ischemic CMP, combined CHF EF 20-25%, HTN, HLD, DM, admitted for CHF (acute on chronic combined), with allergy to ACE I.  Doing better.   Rewiew of Systems:  Constitutional: Negative for malaise, fever and chills. No significant weight loss or weight gain Eyes: Negative for eye pain, redness and discharge, diplopia, visual changes, or flashes of light. ENMT: Negative for ear pain, hoarseness, nasal congestion, sinus pressure and sore throat. No headaches; tinnitus, drooling, or problem swallowing. Cardiovascular: Negative for chest pain, palpitations, diaphoresis, dyspnea and peripheral edema. ; No orthopnea, PND Respiratory: Negative for cough, hemoptysis, wheezing and stridor. No pleuritic chestpain. Gastrointestinal: Negative for nausea, vomiting, diarrhea, constipation, abdominal pain, melena, blood in stool, hematemesis, jaundice and rectal bleeding.    Genitourinary: Negative for frequency, dysuria, incontinence,flank pain and hematuria; Musculoskeletal: Negative for back pain and neck pain. Negative for swelling and trauma.;  Skin: . Negative for pruritus, rash, abrasions, bruising and skin lesion.; ulcerations Neuro: Negative for headache, lightheadedness and neck stiffness. Negative for weakness, altered level of consciousness , altered mental status, extremity weakness, burning feet, involuntary movement, seizure and syncope.  Psych: negative for anxiety, depression, insomnia, tearfulness, panic attacks, hallucinations, paranoia, suicidal or homicidal ideation    Past Medical History  Diagnosis Date  . Chronic combined systolic and diastolic CHF (congestive heart failure) ([place])     a. 02/2014 Echo: EF 20-25% (new), Gr 2 DD, mod conc LVH, mild AI/AS, mod MR, sev dil LA, mild TR.  Marland Kitchen Nonischemic cardiomyopathy  ([place])     a. 02/2014 Echo: EF 20-25%;  b. 03/2014 Cath: LM nl, LAD min irregs, LCX   . Hypertension   . Chronic kidney disease (CKD), stage IV (severe) ([place]) 12/2009    a. baseline creat now ~ 4.  . Erectile dysfunction   . Colonic polyp   . Low serum testosterone level   . Tobacco abuse   . Benign prostatic hypertrophy   . Obesity, Class I, BMI 30-34.9   . Hyperlipidemia   . Adenomatous colon polyp 2006  . H. pylori infection 2013    treated with prevpac  . Mild Ao Stenosis and Insufficiency   . AICD (automatic cardioverter/defibrillator) present     STJ device, implanted 12/03/14 Dr. Lovena   . Sleep apnea 2010    Initially unable to afford CPAP  . 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 Gout   . CHF (congestive heart failure) [facility])     Past Surgical History  Procedure Laterality Date  . Retinal laser procedure      diabetic retinopathy  . Lymph node biopsy      surgical exploration of neck-not entirely clear that this represented a lymph node biopsy  . Colonoscopy  08/23/2004    Dr. Vivi Ferns rectum, diminutive polyp of the rectosigmoid removed, inflamed focally adenomatous polyp  . ft and right heart catheterization with coronary angiogram N/A 03/19/2014    Procedure: LEFT AND RIGHT HEART CATHETERIZATION WITH CORONARY ANGIOGRAM;  Surgeon: Blane Ohara, MD;  Location: [facility] CATH LAB;  Service: Cardiovascular;  Laterality: N/A;  . Cardiac catheterization    . Ep implantable device N/A 12/03/2014    Procedure: ICD Implant;  Surgeon: Evans Lance, MD;  Location: [place] CV LAB;  Service: Cardiovascular;  Laterality: N/A;; St Jude  . Esophagogastroduodenoscopy  2013    Dr. Gala Romney: erosions reminiscent of GAVE but biopsies showed H.pylori gastritis  . Colonoscopy  2013    Dr. Gala Romney: multiple diminutive polyps in distal sigmoid segment, 4 mm pedunculated polyp at hepatic flexure. Tubular adenomas. Surveillance due 2018   . Cholecystectomy N/A  01/07/2015    Procedure: LAPAROSCOPIC CHOLECYSTECTOMY WITH INTRAOPERATIVE CHOLANGIOGRAM;  Surgeon: Donnie Mesa, MD;  Location: [place];  Service: General;  Laterality: N/A;  . Umbilical hernia repair N/A 01/07/2015    Procedure: HERNIA REPAIR UMBILICAL ADULT;  Surgeon: Donnie Mesa, MD;  Location: [place];  Service: General;  Laterality: N/A;  . Ercp N/A 01/09/2015    Procedure: ENDOSCOPIC RETROGRADE CHOLANGIOPANCREATOGRAPHY (ERCP);  Surgeon: Clarene Essex, MD;  Location: [facility] ENDOSCOPY;  Service: Endoscopy;  Laterality: N/A;    Medications:  HOME MEDS: Prior to Admission medications   Medication Sig Start Date End Date Taking? Authorizing Provider  albuterol (PROVENTIL HFA;VENTOLIN HFA) 108 (90 Base) MCG/ACT inhaler Inhale 2 puffs into the lungs every 6 (six) hours as needed for wheezing or shortness of breath. 01/16/15  Yes Susy Frizzle, MD  albuterol (PROVENTIL) (2.5 MG/3ML) 0.083% nebulizer solution Take 3 mLs (2.5 mg total) by nebulization every 6 (six) hours as needed for wheezing. 01/30/15  Yes Susy Frizzle, MD  aspirin EC 81 MG tablet Take 81 mg by mouth daily.   Yes Historical Provider, MD  calcitRIOL (ROCALTROL) 0.25 MCG capsule Take 1 capsule (0.25 mcg total) by mouth daily. 01/11/15  Yes Annita Brod, MD  calcium acetate (PHOSLO) 667 MG capsule Take 1 capsule (667 mg total) by mouth 3 (three) times daily with meals. Patient taking differently: Take 667 mg by mouth daily with supper. Takes once daily with largest meal 01/11/15  Yes Annita Brod, MD  cloNIDine (CATAPRES) 0.3 MG tablet Take 1 tablet (0.3 mg total) by mouth daily. 01/08/15  Yes Susy Frizzle, MD  hydrALAZINE (APRESOLINE) 100 MG tablet Take 1 tablet (100 mg total) by mouth 2 (two) times daily. 01/11/15  Yes Annita Brod, MD  Insulin Glargine (LANTUS SOLOSTAR) 100 UNIT/ML Solostar Pen Inject 30 Units into the skin daily at 10 pm. Patient taking differently: Inject 30 Units into the skin every morning.   02/24/15  Yes Susy Frizzle, MD  metolazone (ZAROXOLYN) 5 MG tablet Take 1 tablet (5 mg total) by mouth daily. 03/05/15  Yes [name], MD  metoprolol succinate (TOPROL-XL) 25 MG 24 hr tablet Take 1 tablet (25 mg total) by mouth daily. 01/08/15  Yes Susy Frizzle, MD  torsemide (DEMADEX) 100 MG tablet Take 1.5 tablets (150 mg total) by mouth 2 (two) times daily. 03/02/15  Yes Susy Frizzle, MD  ONE TOUCH ULTRA TEST test strip Check blood sugar each morning fasting and once daily 2 hours after a meal 12/10/14   Susy Frizzle, MD  oxyCODONE (OXY IR/ROXICODONE) 5 MG immediate release tablet Take 1-2 tablets (5-10 mg total) by mouth every 4 (four) hours as needed for moderate pain, severe pain or breakthrough pain. Patient not taking: Reported on 03/05/2015 01/16/15   Susy Frizzle, MD     Allergies:  Allergies  Allergen Reactions  . Ace Inhibitors Cough    Social History:   reports that he has been smoking Cigarettes.  He started smoking about 56 years ago. He has a 28 pack-year smoking history. He has never used smokeless tobacco. He reports that he does not drink  alcohol or use illicit drugs.  Family History: Family History  Problem Relation Age of Onset  . Hypertension Mother   . Cancer Mother   . Cancer Father   . Diabetes Sister      Physical Exam: Filed Vitals:   03/09/15 0500 03/09/15 0600 03/09/15 0730 03/09/15 0836  BP: 154/62 149/50  152/62  Pulse: 79 52  75  Temp:   98.2 F (36.8 C)   TempSrc:   Oral   Resp:      Height:      Weight:      SpO2: 100% 90%     Blood pressure 152/62, pulse 75, temperature 98.2 F (36.8 C), temperature source Oral, resp. rate 21, height 5\' 9"  (1.753 m), weight 91.5 kg (201 lb 11.5 oz), SpO2 90 %.  GEN:  Pleasant  patient lying in the stretcher in no acute distress; cooperative with exam. PSYCH:  alert and oriented x4; does not appear anxious or depressed; affect is appropriate. HEENT: Mucous membranes pink and  anicteric; PERRLA; EOM intact; no cervical lymphadenopathy nor thyromegaly or carotid bruit; no JVD; There were no stridor. Neck is very supple. Breasts:: Not examined CHEST WALL: No tenderness CHEST: Normal respiration, still has bilateral rales.  HEART: Regular rate and rhythm.  There are no murmur, rub, or gallops.   BACK: No kyphosis or scoliosis; no CVA tenderness ABDOMEN: soft and non-tender; no masses, no organomegaly, normal abdominal bowel sounds; no pannus; no intertriginous candida. There is no rebound and no distention. Rectal Exam: Not done EXTREMITIES: No bone or joint deformity; age-appropriate arthropathy of the hands and knees; no edema; no ulcerations.  There is no calf tenderness. Genitalia: not examined PULSES: 2+ and symmetric SKIN: Normal hydration no rash or ulceration CNS: Cranial nerves 2-12 grossly intact no focal lateralizing neurologic deficit.  Speech is fluent; uvula elevated with phonation, facial symmetry and tongue midline. DTR are normal bilaterally, cerebella exam is intact, barbinski is negative and strengths are equaled bilaterally.  No sensory loss.   Labs on Admission:  Basic Metabolic Panel:  Recent Labs Lab 03/05/15 1103 03/08/15 1946 03/09/15 0358  NA 141 139 142  K 3.6 3.3* 3.6  CL 101 100* 101  CO2 27 23 23   GLUCOSE 175* 112* 120*  BUN 98* 100* 105*  CREATININE 5.82* 5.94* 6.14*  CALCIUM 8.4* 8.7* 9.0   Liver Function Tests:  Recent Labs Lab 03/08/15 1946  AST 22  ALT 22  ALKPHOS 75  BILITOT 0.6  PROT 6.0*  ALBUMIN 3.5   CBC:  Recent Labs Lab 03/05/15 1103 03/08/15 1946 03/09/15 0358  WBC 4.6 4.1 4.3  NEUTROABS 3.3 2.8  --   HGB 9.8* 11.4* 11.9*  HCT 29.1* 34.0* 35.8*  MCV 88.4 87.2 87.5  PLT 146* 171 174   Cardiac Enzymes:  Recent Labs Lab 03/05/15 1103 03/08/15 1946  TROPONINI 0.12* 0.10*    CBG:  Recent Labs Lab 03/08/15 2124 03/09/15 0737  GLUCAP 94 104*     Radiological Exams on Admission: Dg  Chest 2 View  03/08/2015  CLINICAL DATA:  Shortness of breath with bilateral lower extremity swelling. Worse recently. EXAM: CHEST  2 VIEW COMPARISON:  03/05/2015 and 02/26/2015 FINDINGS: Left-sided pacemaker unchanged. Lungs are adequately inflated with small amount right pleural fluid. Likely associated atelectasis in the right base. Mild prominence of the perihilar markings suggesting mild vascular congestion. Mild stable cardiomegaly. Remainder of the exam is unchanged. IMPRESSION: Mild stable cardiomegaly with findings suggesting mild vascular congestion. Small  amount right pleural fluid. Likely associated minimal right basilar atelectasis. Electronically Signed   By: Marin Olp M.D.   On: 03/08/2015 18:48   Assessment/Plan Present on Admission:  . Acute systolic CHF (congestive heart failure) ([place]) . Chronic kidney disease, stage 4, severely decreased GFR (HCC) . Diabetes mellitus type 2 with retinopathy ([place]) . Essential hypertension . Hypokalemia  PLAN:  Acute on chronic combined CHF:  Will continue with current lasix dose.  Will plan d/c home tomorrow if he continues to do well.  DM:  SSI PRN.  HTN:  His BP is a little elevated.  Will increase his Lopressor to 25 BID.  Continue with diuretics and Hydralazine.   CKD;  Follow up outpatient.  He doesn't need dialysis at this time.  Other plans as per orders. Code Status: FULL Haskel Khan, MD.  FACP [facility] Pager [phone] 7pm to 7am.  03/09/2015, 9:27 AM

## 2015-03-10 ENCOUNTER — Encounter (HOSPITAL_COMMUNITY): Payer: Medicare Other

## 2015-03-10 ENCOUNTER — Encounter (HOSPITAL_COMMUNITY)
Admission: RE | Admit: 2015-03-10 | Discharge: 2015-03-10 | Disposition: A | Discharge status: Home / Self Care | Payer: Medicare Other | Source: Ambulatory Visit | Attending: Nephrology | Admitting: Nephrology

## 2015-03-10 LAB — BASIC METABOLIC PANEL
ANION GAP: 16 — AB (ref 5–15)
BUN: 110 mg/dL — AB (ref 6–20)
CHLORIDE: 100 mmol/L — AB (ref 101–111)
CO2: 24 mmol/L (ref 22–32)
Calcium: 8.7 mg/dL — ABNORMAL LOW (ref 8.9–10.3)
Creatinine, Ser: 6.26 mg/dL — ABNORMAL HIGH (ref 0.61–1.24)
GFR calc Af Amer: 9 mL/min — ABNORMAL LOW (ref >60)
GFR, EST NON AFRICAN AMERICAN: 8 mL/min — AB (ref >60)
GLUCOSE: 69 mg/dL (ref 65–99)
POTASSIUM: 3.8 mmol/L (ref 3.5–5.1)
Sodium: 140 mmol/L (ref 135–145)

## 2015-03-10 LAB — GLUCOSE, CAPILLARY: GLUCOSE-CAPILLARY: 87 mg/dL (ref 65–99)

## 2015-03-10 LAB — HEMOGLOBIN A1C
HEMOGLOBIN A1C: 5.5 % (ref 4.8–5.6)
MEAN PLASMA GLUCOSE: 111 mg/dL

## 2015-03-10 MED ORDER — FUROSEMIDE 10 MG/ML IJ SOLN: 80.0000 mg | Freq: Two times a day (BID) | INTRAMUSCULAR | Status: DC

## 2015-03-10 NOTE — Care Management Note (Signed)
Case Management Note  Patient Details  Name: [name] MRN: [id number] Date of Birth: 10-Sep-1945  Subjective/Objective:                  Pt admitted with CHF. Pt from home and ind with ADL's. Pt plans to return home with self care.   Action/Plan: No CM needs.   Expected Discharge Date:  03/11/15               Expected Discharge Plan:  Home/Self Care  In-House Referral:  NA  Discharge planning Services  CM Consult  Post Acute Care Choice:  NA Choice offered to:  NA  DME Arranged:    DME Agency:     HH Arranged:    HH Agency:     Status of Service:  Completed, signed off  Medicare Important Message Given:    Date Medicare IM Given:    Medicare IM give by:    Date Additional Medicare IM Given:    Additional Medicare Important Message give by:     If discussed at [place] of Stay Meetings, dates discussed:    Additional Comments:  Sherald Barge, RN 03/10/2015, 10:35 AM

## 2015-03-10 NOTE — Discharge Summary (Signed)
Physician Discharge Summary  [name] [id number] DOB: 06-24-1945 DOA: 03/08/2015  PCP: Harold Fraction, MD  Admit date: 03/08/2015 Discharge date: 03/10/2015  Time spent: 35 minutes  Recommendations for Outpatient Follow-up:  1. Follow up with PCP next week. 2. Follow up with your nephrology [name] as scheduled.    Discharge Diagnoses:  Active Problems:   Diabetes mellitus type 2 with retinopathy ([place])   Essential hypertension   Chronic kidney disease, stage 4, severely decreased GFR (H[facility])   Hypokalemia   Acute systolic CHF (congestive heart failure) ([place])   Discharge Condition: Improved.  Ambulate without SOB.   Diet recommendation: cardiac heatlhy, carb modified, and renal insufficiency diet.   Filed Weights   03/08/15 2325 03/08/15 2330 03/10/15 0500  Weight: 91.5 kg (201 lb 11.5 oz) 91.5 kg (201 lb 11.5 oz) 91.5 kg (201 lb 11.5 oz)    History of present illness: Patient was admitted by Harold [name] on Mar 08, 2015 for SOB and acute systolic CHF with CKD.  As per her prior H and P: " [name] is a 70 y.o. male with a history of Systolic CHF (EF [id number]) , HTN, Stage IV CKD who presents to the ED with complaints of worsening SOB, Orthopnea and Edema of both lower legs x 2-3 days. He also reports a weight gain of 6 pounds over the past 3 days. He denies any chest pain. He was evaluated in the ED and was found to have a BNP > 4500, and a chest X-ray revealing mild CHF . He was administered 80 of IV Lasix in the ED and referred for admission.  Hospital Course:  [name] is an 70 y.o. male with hx of CKD5, non ischemic CMP, combined CHF EF 20-25%, HTN, HLD, DM, admitted for CHF (acute on chronic combined), with allergy to ACE I. Doing better. With diuresis, he improved with no SOB or CP.  He was kept in the ICU and continued with iV diuresis.  His Cr has been stable, though it was elevated.  His hospital stay was uneventful, and he felt well, anxious to  go home.  He is stable for discharge to follow up with his PCP and nephrology next week.  Thank you and Good Day.    Discharge Exam: Filed Vitals:   03/10/15 0600 03/10/15 0755  BP: 145/58   Pulse: 66   Temp:  97.1 F (36.2 C)  Resp:      Discharge Instructions   Discharge Instructions    Diet - low sodium heart healthy    Complete by:  As directed      Increase activity slowly    Complete by:  As directed           Current Discharge Medication List    START taking these medications   Details  furosemide (LASIX) 10 MG/ML injection Inject 8 mLs (80 mg total) into the vein every 12 (twelve) hours. Qty: 4 mL, Refills: 0      CONTINUE these medications which have NOT CHANGED   Details  albuterol (PROVENTIL HFA;VENTOLIN HFA) 108 (90 Base) MCG/ACT inhaler Inhale 2 puffs into the lungs every 6 (six) hours as needed for wheezing or shortness of breath. Qty: 1 Inhaler, Refills: 0    albuterol (PROVENTIL) (2.5 MG/3ML) 0.083% nebulizer solution Take 3 mLs (2.5 mg total) by nebulization every 6 (six) hours as needed for wheezing. Qty: 75 mL, Refills: 12    aspirin EC 81 MG tablet Take 81 mg  by mouth daily.    calcitRIOL (ROCALTROL) 0.25 MCG capsule Take 1 capsule (0.25 mcg total) by mouth daily. Qty: 30 capsule, Refills: 1    calcium acetate (PHOSLO) 667 MG capsule Take 1 capsule (667 mg total) by mouth 3 (three) times daily with meals. Qty: 90 capsule, Refills: 1    cloNIDine (CATAPRES) 0.3 MG tablet Take 1 tablet (0.3 mg total) by mouth daily. Qty: 90 tablet, Refills: 3    hydrALAZINE (APRESOLINE) 100 MG tablet Take 1 tablet (100 mg total) by mouth 2 (two) times daily. Qty: 60 tablet, Refills: 1    Insulin Glargine (LANTUS SOLOSTAR) 100 UNIT/ML Solostar Pen Inject 30 Units into the skin daily at 10 pm. Qty: 45 mL, Refills: 1    metolazone (ZAROXOLYN) 5 MG tablet Take 1 tablet (5 mg total) by mouth daily. Qty: 30 tablet, Refills: 0    metoprolol succinate (TOPROL-XL)  25 MG 24 hr tablet Take 1 tablet (25 mg total) by mouth daily. Qty: 90 tablet, Refills: 3    torsemide (DEMADEX) 100 MG tablet Take 1.5 tablets (150 mg total) by mouth 2 (two) times daily. Qty: 90 tablet, Refills: 2      STOP taking these medications     ONE TOUCH ULTRA TEST test strip      oxyCODONE (OXY IR/ROXICODONE) 5 MG immediate release tablet        Allergies  Allergen Reactions  . Ace Inhibitors Cough      The results of significant diagnostics from this hospitalization (including imaging, microbiology, ancillary and laboratory) are listed below for reference.    Significant Diagnostic Studies: Dg Chest 2 View  03/08/2015  CLINICAL DATA:  Shortness of breath with bilateral lower extremity swelling. Worse recently. EXAM: CHEST  2 VIEW COMPARISON:  03/05/2015 and 02/26/2015 FINDINGS: Left-sided pacemaker unchanged. Lungs are adequately inflated with small amount right pleural fluid. Likely associated atelectasis in the right base. Mild prominence of the perihilar markings suggesting mild vascular congestion. Mild stable cardiomegaly. Remainder of the exam is unchanged. IMPRESSION: Mild stable cardiomegaly with findings suggesting mild vascular congestion. Small amount right pleural fluid. Likely associated minimal right basilar atelectasis. Electronically Signed   By: Marin Olp M.D.   On: 03/08/2015 18:48   Dg Chest Port 1 View  03/05/2015  CLINICAL DATA:  Shortness of breath for 7 days. EXAM: PORTABLE CHEST 1 VIEW COMPARISON:  02/26/2015 FINDINGS: Single lead AICD noted. Mild enlargement of the cardiopericardial silhouette with atherosclerotic aortic arch and aortic tortuosity. Upper zone pulmonary vascular prominence. Indistinct interstitial accentuation at the right lung base with indistinctness of the right costophrenic angle laterally. IMPRESSION: 1. Mild enlargement of the cardiopericardial silhouette with pulmonary venous hypertension. 2. Indistinct opacity peripherally  at the right lung base with possible blunting of the right lateral costophrenic angle. Difficult to exclude bronchopneumonia at the right lung base although this could alternatively represent some confluent interstitial edema and trace right pleural effusion. 3. Atherosclerotic aortic arch. Electronically Signed   By: Van Clines M.D.   On: 03/05/2015 11:09    Microbiology: Recent Results (from the past 240 hour(s))  MRSA PCR Screening     Status: None   Collection Time: 03/08/15 11:00 PM  Result Value Ref Range Status   MRSA by PCR NEGATIVE NEGATIVE Final    Comment:        The GeneXpert MRSA Assay (FDA approved for NASAL specimens only), is one component of a comprehensive MRSA colonization surveillance program. It is not intended to diagnose MRSA  infection nor to guide or monitor treatment for MRSA infections.      Labs: Basic Metabolic Panel:  Recent Labs Lab 03/05/15 1103 03/08/15 1946 03/09/15 0358 03/10/15 0425  NA 141 139 142 140  K 3.6 3.3* 3.6 3.8  CL 101 100* 101 100*  CO2 27 23 23 24   GLUCOSE 175* 112* 120* 69  BUN 98* 100* 105* 110*  CREATININE 5.82* 5.94* 6.14* 6.26*  CALCIUM 8.4* 8.7* 9.0 8.7*   Liver Function Tests:  Recent Labs Lab 03/08/15 1946  AST 22  ALT 22  ALKPHOS 75  BILITOT 0.6  PROT 6.0*  ALBUMIN 3.5   CBC:  Recent Labs Lab 03/05/15 1103 03/08/15 1946 03/09/15 0358  WBC 4.6 4.1 4.3  NEUTROABS 3.3 2.8  --   HGB 9.8* 11.4* 11.9*  HCT 29.1* 34.0* 35.8*  MCV 88.4 87.2 87.5  PLT 146* 171 174   Cardiac Enzymes:  Recent Labs Lab 03/05/15 1103 03/08/15 1946  TROPONINI 0.12* 0.10*     Recent Labs  01/28/15 2135 03/05/15 1104 03/08/15 1946  BNP >4500.0* >4500.0* >4500.0*    CBG:  Recent Labs Lab 03/09/15 0737 03/09/15 1147 03/09/15 1613 03/09/15 2116 03/10/15 0733  GLUCAP 104* 106* 116* 150* 87    Signed:  Cantrell Martus MD.  [facility] 03/10/2015, 9:23 AM

## 2015-03-10 NOTE — Progress Notes (Signed)
Pt scheduled for labs and aranesp but cancelled due to being inpatient. Wife states he will reschedule later.

## 2015-03-10 NOTE — Progress Notes (Signed)
Alert and oriented. Vital signs stable. Saline lock removed. Telemetry D/C'ed. Discharge instructions given. Patient verbalized understanding of instructions. Left floor via wheelchair with nursing staff and family member.  Discharged home.  Noreene Larsson 03/10/2015 10:22 AM

## 2015-03-10 NOTE — Care Management Important Message (Signed)
Important Message  Patient Details  Name: [name] MRN: [id number] Date of Birth: Jul 04, 1945   Medicare Important Message Given:  N/A - LOS <3 / Initial given by admissions    Sherald Barge, RN 03/10/2015, 10:36 AM

## 2015-03-11 ENCOUNTER — Telehealth: Payer: Self-pay | Admitting: Family Medicine

## 2015-03-11 NOTE — Telephone Encounter (Signed)
Patient is calling to see if he can get referral to dr bynum pulmonologist if possible  [phone]

## 2015-03-12 ENCOUNTER — Encounter: Payer: Self-pay | Admitting: Internal Medicine

## 2015-03-12 ENCOUNTER — Ambulatory Visit (INDEPENDENT_AMBULATORY_CARE_PROVIDER_SITE_OTHER): Payer: Medicare Other | Admitting: Internal Medicine

## 2015-03-12 VITALS — BP 142/52 | HR 76 | Ht 69.0 in | Wt 201.4 lb

## 2015-03-12 DIAGNOSIS — I429 Cardiomyopathy, unspecified: Secondary | ICD-10-CM

## 2015-03-12 DIAGNOSIS — I5022 Chronic systolic (congestive) heart failure: Secondary | ICD-10-CM | POA: Diagnosis not present

## 2015-03-12 LAB — CUP PACEART INCLINIC DEVICE CHECK
Battery Remaining Longevity: 94.8
Brady Statistic RV Percent Paced: 0 %
Date Time Interrogation Session: 20170223131923
HighPow Impedance: 68.625
Implantable Lead Model: 7122
Lead Channel Sensing Intrinsic Amplitude: 12 mV
Lead Channel Setting Pacing Pulse Width: 0.5 ms
Lead Channel Setting Sensing Sensitivity: 0.5 mV
MDC IDC LEAD IMPLANT DT: 20161116
MDC IDC LEAD LOCATION: 753860
MDC IDC MSMT LEADCHNL RV IMPEDANCE VALUE: 362.5 Ohm
MDC IDC MSMT LEADCHNL RV PACING THRESHOLD AMPLITUDE: 0.75 V
MDC IDC MSMT LEADCHNL RV PACING THRESHOLD AMPLITUDE: 0.75 V
MDC IDC MSMT LEADCHNL RV PACING THRESHOLD PULSEWIDTH: 0.5 ms
MDC IDC MSMT LEADCHNL RV PACING THRESHOLD PULSEWIDTH: 0.5 ms
MDC IDC SET LEADCHNL RV PACING AMPLITUDE: 2.5 V
Pulse Gen Serial Number: 7228612

## 2015-03-12 NOTE — Patient Instructions (Signed)
Medication Instructions:  Your physician recommends that you continue on your current medications as directed. Please refer to the Current Medication list given to you today.   Labwork: None Ordered   Testing/Procedures: None Ordered   Follow-Up: Remote monitoring is used to monitor your Pacemaker from home. This monitoring reduces the number of office visits required to check your device to one time per year. It allows [place] to keep an eye on the functioning of your device to ensure it is working properly. You are scheduled for a device check from home on 06/11/15. You may send your transmission at any time that day. If you have a wireless device, the transmission will be sent automatically. After your physician reviews your transmission, you will receive a postcard with your next transmission date.  Your physician wants you to follow-up in: 9 months with Dr. Lovena Le.  You will receive a reminder letter in the mail two months in advance. If you don't receive a letter, please call our office to schedule the follow-up appointment.   If you need a refill on your cardiac medications before your next appointment, please call your pharmacy.

## 2015-03-12 NOTE — Progress Notes (Signed)
HPI Mr. Sipp returns today for ongoing followup of his ICD. He has a longstanding history of severe LV dysfunction dating back to 2010. He had normalization of his EF by echo in 2014. He did well until representing in February/March. He had worsening dyspnea. He currently has class 2 symptoms. A nuclear stress test in mid February demonstrated an EF of 20% with scar and catheterization in March demonstrated no CAD but severe LV dysfunction. He is on maximal medical therapy but cannot take an ACE/ARB due to renal insufficiency. He has stage 5 renal insufficiency and his most recent serum creatinine previously 4.5 but now 6.5. Despite this, his fluid status is reasonably well controlled. He was in the hospital several weeks ago with gallstones and required stone removal. He has been counseled to have a fistula placed for pending need for HD.  He has not had syncope. Allergies  Allergen Reactions  . Ace Inhibitors Cough     Current Outpatient Prescriptions  Medication Sig Dispense Refill  . albuterol (PROVENTIL HFA;VENTOLIN HFA) 108 (90 Base) MCG/ACT inhaler Inhale 2 puffs into the lungs every 6 (six) hours as needed for wheezing or shortness of breath. 1 Inhaler 0  . albuterol (PROVENTIL) (2.5 MG/3ML) 0.083% nebulizer solution Take 3 mLs (2.5 mg total) by nebulization every 6 (six) hours as needed for wheezing. 75 mL 12  . aspirin EC 81 MG tablet Take 81 mg by mouth daily.    . calcitRIOL (ROCALTROL) 0.25 MCG capsule Take 1 capsule (0.25 mcg total) by mouth daily. 30 capsule 1  . calcium acetate, Phos Binder, (PHOSLYRA) 667 MG/5ML SOLN Take 667 mg by mouth daily.    . cloNIDine (CATAPRES) 0.3 MG tablet Take 1 tablet (0.3 mg total) by mouth daily. 90 tablet 3  . hydrALAZINE (APRESOLINE) 100 MG tablet Take 1 tablet (100 mg total) by mouth 2 (two) times daily. 60 tablet 1  . Insulin Glargine (LANTUS SOLOSTAR) 100 UNIT/ML Solostar Pen Inject 30 Units into the skin daily at 10 pm. (Patient taking  differently: Inject 30 Units into the skin every morning. ) 45 mL 1  . metolazone (ZAROXOLYN) 5 MG tablet Take 1 tablet (5 mg total) by mouth daily. 30 tablet 0  . metoprolol succinate (TOPROL-XL) 25 MG 24 hr tablet Take 1 tablet (25 mg total) by mouth daily. 90 tablet 3  . torsemide (DEMADEX) 100 MG tablet Take 1.5 tablets (150 mg total) by mouth 2 (two) times daily. 90 tablet 2   No current facility-administered medications for this visit.     Past Medical History  Diagnosis Date  . Chronic combined systolic and diastolic CHF (congestive heart failure) ([place])     a. 02/2014 Echo: EF 20-25% (new), Gr 2 DD, mod conc LVH, mild AI/AS, mod MR, sev dil LA, mild TR.  Marland Kitchen Nonischemic cardiomyopathy ([place])     a. 02/2014 Echo: EF 20-25%;  b. 03/2014 Cath: LM nl, LAD min irregs, LCX   . Hypertension   . Chronic kidney disease (CKD), stage IV (severe) ([place]) 12/2009    a. baseline creat now ~ 4.  . Erectile dysfunction   . Colonic polyp   . Low serum testosterone level   . Tobacco abuse   . Benign prostatic hypertrophy   . Obesity, Class I, BMI 30-34.9   . Hyperlipidemia   . Adenomatous colon polyp 2006  . H. pylori infection 2013    treated with prevpac  . Mild Ao Stenosis and Insufficiency   .  AICD (automatic cardioverter/defibrillator) present     STJ device, implanted 12/03/14 Dr. Lovena Le  . Sleep apnea 2010    Initially unable to afford CPAP  . 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 Gout   . CHF (congestive heart failure) ([facility])     ROS:   All systems reviewed and negative except as noted in the HPI.   Past Surgical History  Procedure Laterality Date  . Retinal laser procedure      diabetic retinopathy  . Lymph node biopsy      surgical exploration of neck-not entirely clear that this represented a lymph node biopsy  . Colonoscopy  08/23/2004    Dr. Vivi Ferns rectum, diminutive polyp of the rectosigmoid removed, inflamed focally adenomatous polyp  .  Left and right heart catheterization with coronary angiogram N/A 03/19/2014    Procedure: LEFT AND RIGHT HEART CATHETERIZATION WITH CORONARY ANGIOGRAM;  Surgeon: Blane Ohara, MD;  Location: [facility] CATH LAB;  Service: Cardiovascular;  Laterality: N/A;  . Cardiac catheterization    . Ep implantable device N/A 12/03/2014    Procedure: ICD Implant;  Surgeon: Evans Lance, MD;  Location: [facility] CV LAB;  Service: Cardiovascular;  Laterality: N/A;; St Jude  . Esophagogastroduodenoscopy  2013    Dr. Gala Romney: erosions reminiscent of GAVE but biopsies showed H.pylori gastritis  . Colonoscopy  2013    Dr. Gala Romney: multiple diminutive polyps in distal sigmoid segment, 4 mm pedunculated polyp at hepatic flexure. Tubular adenomas. Surveillance due 2018   . Cholecystectomy N/A 01/07/2015    Procedure: LAPAROSCOPIC CHOLECYSTECTOMY WITH INTRAOPERATIVE CHOLANGIOGRAM;  Surgeon: Donnie Mesa, MD;  Location: [place];  Service: General;  Laterality: N/A;  . Umbilical hernia repair N/A 01/07/2015    Procedure: HERNIA REPAIR UMBILICAL ADULT;  Surgeon: Donnie Mesa, MD;  Location: [facility];  Service: General;  Laterality: N/A;  . Ercp N/A 01/09/2015    Procedure: ENDOSCOPIC RETROGRADE CHOLANGIOPANCREATOGRAPHY (ERCP);  Surgeon: Clarene Essex, MD;  Location: [facility] ENDOSCOPY;  Service: Endoscopy;  Laterality: N/A;     Family History  Problem Relation Age of Onset  . Hypertension Mother   . Cancer Mother   . Cancer Father   . Diabetes Sister      Social History   Social History  . Marital Status: Married    Spouse Name: N/A  . Number of Children: 4  . Years of Education: N/A   Occupational History  . Semi - Retired Administrator    Social History Main Topics  . Smoking status: Current Some Day Smoker -- 0.50 packs/day for 56 years    Types: Cigarettes    Start date: 05/31/1958  . Smokeless tobacco: Never Used  . Alcohol Use: No  . Drug Use: No  . Sexual Activity: Not Currently   Other Topics Concern  .  Not on file   Social History Narrative     BP 142/52 mmHg  Pulse 76  Ht 5\' 9"  (1.753 m)  Wt 201 lb 6.4 oz (91.354 kg)  BMI 29.73 kg/m2  Physical Exam:  Well appearing 70 yo man, NAD HEENT: Unremarkable Neck:  7 cm JVD, no thyromegally Lymphatics:  No adenopathy Back:  No CVA tenderness Lungs:  Clear with no wheezes HEART:  Regular rate rhythm, no murmurs, no rubs, no clicks Abd:  soft, positive bowel sounds, no organomegally, no rebound, no guarding Ext:  2 plus pulses, no edema, no cyanosis, no clubbing Skin:  No rashes no  nodules Neuro:  CN II through XII intact, motor grossly intact   ICD - his St. Jude ICD is working normally.  Assess/Plan: 1. Chronic systolic heart failure - he is edematous but does not have much in the way of venous congestion. He is on high dose diuretic therapy. He is encouraged to reduce his salt intake. 2. ESRD - I strongly encouraged him to undergo insertion of a fistula so that when he develops volume overload and needs urgent dialysis, he can will not need an indwelling catheter. 3. CAD - he denies anginal symptoms. Will follow 4. ICD - his St. Jude ICD is working normally.   Mikle Bosworth.D.

## 2015-03-13 NOTE — Telephone Encounter (Signed)
lmtrc to pt have a couple questions for pt

## 2015-03-16 DIAGNOSIS — R809 Proteinuria, unspecified: Secondary | ICD-10-CM | POA: Diagnosis not present

## 2015-03-16 DIAGNOSIS — N185 Chronic kidney disease, stage 5: Secondary | ICD-10-CM | POA: Diagnosis not present

## 2015-03-16 DIAGNOSIS — D631 Anemia in chronic kidney disease: Secondary | ICD-10-CM | POA: Diagnosis not present

## 2015-03-16 DIAGNOSIS — E1129 Type 2 diabetes mellitus with other diabetic kidney complication: Secondary | ICD-10-CM | POA: Diagnosis not present

## 2015-03-16 DIAGNOSIS — I120 Hypertensive chronic kidney disease with stage 5 chronic kidney disease or end stage renal disease: Secondary | ICD-10-CM | POA: Diagnosis not present

## 2015-03-20 ENCOUNTER — Other Ambulatory Visit: Payer: Self-pay

## 2015-03-20 ENCOUNTER — Encounter: Payer: Self-pay | Admitting: Vascular Surgery

## 2015-03-20 DIAGNOSIS — Z01810 Encounter for preprocedural cardiovascular examination: Secondary | ICD-10-CM

## 2015-03-20 DIAGNOSIS — N185 Chronic kidney disease, stage 5: Secondary | ICD-10-CM

## 2015-03-23 DIAGNOSIS — N185 Chronic kidney disease, stage 5: Secondary | ICD-10-CM | POA: Diagnosis not present

## 2015-03-24 ENCOUNTER — Encounter: Payer: Self-pay | Admitting: Vascular Surgery

## 2015-03-25 ENCOUNTER — Ambulatory Visit (INDEPENDENT_AMBULATORY_CARE_PROVIDER_SITE_OTHER)
Admission: RE | Admit: 2015-03-25 | Discharge: 2015-03-25 | Disposition: A | Discharge status: Home / Self Care | Payer: Medicare Other | Source: Ambulatory Visit | Attending: Vascular Surgery | Admitting: Vascular Surgery

## 2015-03-25 ENCOUNTER — Encounter: Payer: Self-pay | Admitting: Vascular Surgery

## 2015-03-25 ENCOUNTER — Ambulatory Visit (HOSPITAL_COMMUNITY)
Admission: RE | Admit: 2015-03-25 | Discharge: 2015-03-25 | Disposition: A | Discharge status: Home / Self Care | Payer: Medicare Other | Source: Ambulatory Visit | Attending: Vascular Surgery | Admitting: Vascular Surgery

## 2015-03-25 ENCOUNTER — Ambulatory Visit (INDEPENDENT_AMBULATORY_CARE_PROVIDER_SITE_OTHER): Payer: Medicare Other | Admitting: Vascular Surgery

## 2015-03-25 VITALS — BP 154/86 | HR 68 | Temp 97.0°F | Resp 14 | Ht 69.0 in | Wt 201.0 lb

## 2015-03-25 DIAGNOSIS — N185 Chronic kidney disease, stage 5: Secondary | ICD-10-CM

## 2015-03-25 DIAGNOSIS — Z720 Tobacco use: Secondary | ICD-10-CM | POA: Diagnosis not present

## 2015-03-25 DIAGNOSIS — E1121 Type 2 diabetes mellitus with diabetic nephropathy: Secondary | ICD-10-CM | POA: Insufficient documentation

## 2015-03-25 DIAGNOSIS — E785 Hyperlipidemia, unspecified: Secondary | ICD-10-CM | POA: Insufficient documentation

## 2015-03-25 DIAGNOSIS — I132 Hypertensive heart and chronic kidney disease with heart failure and with stage 5 chronic kidney disease, or end stage renal disease: Secondary | ICD-10-CM | POA: Diagnosis not present

## 2015-03-25 DIAGNOSIS — N184 Chronic kidney disease, stage 4 (severe): Secondary | ICD-10-CM | POA: Diagnosis not present

## 2015-03-25 DIAGNOSIS — E11319 Type 2 diabetes mellitus with unspecified diabetic retinopathy without macular edema: Secondary | ICD-10-CM | POA: Diagnosis not present

## 2015-03-25 DIAGNOSIS — I5042 Chronic combined systolic (congestive) and diastolic (congestive) heart failure: Secondary | ICD-10-CM | POA: Diagnosis not present

## 2015-03-25 DIAGNOSIS — Z01810 Encounter for preprocedural cardiovascular examination: Secondary | ICD-10-CM

## 2015-03-25 DIAGNOSIS — E1122 Type 2 diabetes mellitus with diabetic chronic kidney disease: Secondary | ICD-10-CM | POA: Diagnosis not present

## 2015-03-25 NOTE — Progress Notes (Signed)
History of Present Illness:  Patient is a 70 y.o. year old male who presents for placement of a permanent hemodialysis access. The patient is right handed, but he does have a ICD on the left side.   .  The patient is not currently on hemodialysis.  Other chronic medical problems include HTN managed with metoprolol, DM managed with insulin Non ischemic cardiomyopathy, CAD managed with ICD and daily 81 mg aspirin.   He has stage IV CKD.    Past Medical History  Diagnosis Date  . Chronic combined systolic and diastolic CHF (congestive heart failure) ([place])     a. 02/2014 Echo: EF 20-25% (new), Gr 2 DD, mod conc LVH, mild AI/AS, mod MR, sev dil LA, mild TR.  Marland Kitchen Nonischemic cardiomyopathy ([place])     a. 02/2014 Echo: EF 20-25%;  b. 03/2014 Cath: LM nl, LAD min irregs, LCX   . Hypertension   . Chronic kidney disease (CKD), stage IV (severe) ([place]) 12/2009    a. baseline creat now ~ 4.  . Erectile dysfunction   . Colonic polyp   . Low serum testosterone level   . Tobacco abuse   . Benign prostatic hypertrophy   . Obesity, Class I, BMI 30-34.9   . Hyperlipidemia   . Adenomatous colon polyp 2006  . H. pylori infection 2013    treated with prevpac  . Mild Ao Stenosis and Insufficiency   . AICD (automatic cardioverter/defibrillator) present     STJ device, implanted 12/03/14 Dr. Lovena Le  . Sleep apnea 2010    Initially unable to afford CPAP  . 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 Gout   . CHF (congestive heart failure) ([place])   . Hyperparathyroidism [facility])     Past Surgical History  Procedure Laterality Date  . Retinal laser procedure      diabetic retinopathy  . Lymph node biopsy      surgical exploration of neck-not entirely clear that this represented a lymph node biopsy  . Colonoscopy  08/23/2004    Dr. Vivi Ferns rectum, diminutive polyp of the rectosigmoid removed, inflamed focally adenomatous polyp  . Left and right heart catheterization with coronary  angiogram N/A 03/19/2014    Procedure: LEFT AND RIGHT HEART CATHETERIZATION WITH CORONARY ANGIOGRAM;  Surgeon: Blane Ohara, MD;  Location: [facility] CATH LAB;  Service: Cardiovascular;  Laterality: N/A;  . Cardiac catheterization    . Ep implantable device N/A 12/03/2014    Procedure: ICD Implant;  Surgeon: Evans Lance, MD;  Location: [place] CV LAB;  Service: Cardiovascular;  Laterality: N/A;; St Jude  . Esophagogastroduodenoscopy  2013    Dr. Gala Romney: erosions reminiscent of GAVE but biopsies showed H.pylori gastritis  . Colonoscopy  2013    Dr. Gala Romney: multiple diminutive polyps in distal sigmoid segment, 4 mm pedunculated polyp at hepatic flexure. Tubular adenomas. Surveillance due 2018   . Cholecystectomy N/A 01/07/2015    Procedure: LAPAROSCOPIC CHOLECYSTECTOMY WITH INTRAOPERATIVE CHOLANGIOGRAM;  Surgeon: Donnie Mesa, MD;  Location: [place];  Service: General;  Laterality: N/A;  . Umbilical hernia repair N/A 01/07/2015    Procedure: HERNIA REPAIR UMBILICAL ADULT;  Surgeon: Donnie Mesa, MD;  Location: [place];  Service: General;  Laterality: N/A;  . Ercp N/A 01/09/2015    Procedure: ENDOSCOPIC RETROGRADE CHOLANGIOPANCREATOGRAPHY (ERCP);  Surgeon: Clarene Essex, MD;  Location: [facility] ENDOSCOPY;  Service: Endoscopy;  Laterality: N/A;     Social History Social History  Substance Use  Topics  . Smoking status: Current Some Day Smoker -- 0.25 packs/day for 56 years    Types: Cigarettes    Start date: 05/31/1958  . Smokeless tobacco: Never Used  . Alcohol Use: No    Family History Family History  Problem Relation Age of Onset  . Hypertension Mother   . Cancer Mother   . Cancer Father   . Diabetes Sister     Allergies  Allergies  Allergen Reactions  . Ace Inhibitors Cough     Current Outpatient Prescriptions  Medication Sig Dispense Refill  . albuterol (PROVENTIL HFA;VENTOLIN HFA) 108 (90 Base) MCG/ACT inhaler Inhale 2 puffs into the lungs every 6 (six) hours as needed for  wheezing or shortness of breath. 1 Inhaler 0  . albuterol (PROVENTIL) (2.5 MG/3ML) 0.083% nebulizer solution Take 3 mLs (2.5 mg total) by nebulization every 6 (six) hours as needed for wheezing. 75 mL 12  . aspirin EC 81 MG tablet Take 81 mg by mouth daily.    . calcitRIOL (ROCALTROL) 0.25 MCG capsule Take 1 capsule (0.25 mcg total) by mouth daily. 30 capsule 1  . calcium acetate, Phos Binder, (PHOSLYRA) 667 MG/5ML SOLN Take 667 mg by mouth daily.    . cloNIDine (CATAPRES) 0.3 MG tablet Take 1 tablet (0.3 mg total) by mouth daily. 90 tablet 3  . colchicine 0.6 MG tablet Take 0.6 mg by mouth as needed.    . diphenhydramine-acetaminophen (TYLENOL PM EXTRA STRENGTH) 25-500 MG TABS tablet Take 1 tablet by mouth at bedtime as needed.    . hydrALAZINE (APRESOLINE) 100 MG tablet Take 1 tablet (100 mg total) by mouth 2 (two) times daily. 60 tablet 1  . Insulin Glargine (LANTUS SOLOSTAR) 100 UNIT/ML Solostar Pen Inject 30 Units into the skin daily at 10 pm. (Patient taking differently: Inject 30 Units into the skin every morning. ) 45 mL 1  . metolazone (ZAROXOLYN) 5 MG tablet Take 1 tablet (5 mg total) by mouth daily. 30 tablet 0  . metoprolol succinate (TOPROL-XL) 25 MG 24 hr tablet Take 1 tablet (25 mg total) by mouth daily. 90 tablet 3  . potassium chloride (KLOR-CON) 20 MEQ packet Take by mouth 2 (two) times daily.    . sorbitol 70 % solution Take 15 mLs by mouth daily as needed. For constipation - PRN    . torsemide (DEMADEX) 100 MG tablet Take 1.5 tablets (150 mg total) by mouth 2 (two) times daily. 90 tablet 2   No current facility-administered medications for this visit.    ROS:   General:  No weight loss, Fever, chills  HEENT: No recent headaches, no nasal bleeding, no visual changes, no sore throat  Neurologic: No dizziness, blackouts, seizures. No recent symptoms of stroke or mini- stroke. No recent episodes of slurred speech, or temporary blindness.  Cardiac: No recent episodes of  chest pain/pressure, no shortness of breath at rest.  No shortness of breath with exertion.  Denies history of atrial fibrillation or irregular heartbeat  Vascular: No history of rest pain in feet.  No history of claudication.  No history of non-healing ulcer, No history of DVT   Pulmonary: No home oxygen, no productive cough, no hemoptysis,  No asthma or wheezing  Musculoskeletal:  [ ]  Arthritis, [ ]  Low back pain,  [ ]  Joint pain  Hematologic:No history of hypercoagulable state.  No history of easy bleeding.  No history of anemia  Gastrointestinal: No hematochezia or melena,  No gastroesophageal reflux, no trouble swallowing  Urinary: [  x ] chronic Kidney disease, [ ]  on HD - [ ]  MWF or [ ]  TTHS, [ ]  Burning with urination, [ ]  Frequent urination, [ ]  Difficulty urinating;   Skin: No rashes  Psychological: No history of anxiety,  No history of depression   Physical Examination  Filed Vitals:   03/25/15 1327  BP: 154/86  Pulse: 68  Temp: 97 F (36.1 C)  TempSrc: Oral  Resp: 14  Height: 5\' 9"  (1.753 m)  Weight: 201 lb (91.173 kg)  SpO2: 98%    Body mass index is 29.67 kg/(m^2).  General:  Alert and oriented, no acute distress HEENT: Normal Neck: No bruit or JVD Pulmonary: Clear to auscultation left and wheezing on the right chronic COPD Cardiac: Regular Rate and Rhythm without murmur Gastrointestinal: Soft, non-tender, non-distended, no mass, no scars Skin: No rash Extremity Pulses:  2+ radial, brachial pulses bilaterally Musculoskeletal: No deformity or edema  Neurologic: Upper and lower extremity motor 5/5 and symmetric  DATA:  Vein mapping shows acceptably cephalic vein in the fore are.     ASSESSMENT:  CKD stageIV   PLAN: He is right handed, but has an ICD on the left.  We will plan a right radial cephalic AV fistula by Dr. Scot Dock March 21 st.  The patient and his wife expressed understanding and wish to proceed. Theda Sers, Javad Salva [facility] PA-C [facility] [place] Office: [phone]   The patient was seen in conjunction with Dr. Scot Dock

## 2015-03-27 ENCOUNTER — Other Ambulatory Visit: Payer: Self-pay

## 2015-03-27 ENCOUNTER — Encounter: Payer: Self-pay | Admitting: Family Medicine

## 2015-03-29 ENCOUNTER — Encounter (HOSPITAL_COMMUNITY): Payer: Self-pay

## 2015-03-29 ENCOUNTER — Emergency Department (HOSPITAL_COMMUNITY): Payer: Medicare Other

## 2015-03-29 ENCOUNTER — Inpatient Hospital Stay (HOSPITAL_COMMUNITY)
Admission: EM | Admit: 2015-03-29 | Discharge: 2015-03-31 | DRG: 291 | Disposition: A | Discharge status: Home / Self Care | Payer: Medicare Other | Attending: Internal Medicine | Admitting: Internal Medicine

## 2015-03-29 DIAGNOSIS — Z833 Family history of diabetes mellitus: Secondary | ICD-10-CM | POA: Diagnosis not present

## 2015-03-29 DIAGNOSIS — Z794 Long term (current) use of insulin: Secondary | ICD-10-CM | POA: Diagnosis not present

## 2015-03-29 DIAGNOSIS — N179 Acute kidney failure, unspecified: Secondary | ICD-10-CM | POA: Diagnosis not present

## 2015-03-29 DIAGNOSIS — G4730 Sleep apnea, unspecified: Secondary | ICD-10-CM | POA: Diagnosis present

## 2015-03-29 DIAGNOSIS — E213 Hyperparathyroidism, unspecified: Secondary | ICD-10-CM | POA: Diagnosis present

## 2015-03-29 DIAGNOSIS — I2510 Atherosclerotic heart disease of native coronary artery without angina pectoris: Secondary | ICD-10-CM | POA: Diagnosis present

## 2015-03-29 DIAGNOSIS — I110 Hypertensive heart disease with heart failure: Secondary | ICD-10-CM | POA: Diagnosis not present

## 2015-03-29 DIAGNOSIS — I509 Heart failure, unspecified: Secondary | ICD-10-CM

## 2015-03-29 DIAGNOSIS — Z8249 Family history of ischemic heart disease and other diseases of the circulatory system: Secondary | ICD-10-CM

## 2015-03-29 DIAGNOSIS — F17210 Nicotine dependence, cigarettes, uncomplicated: Secondary | ICD-10-CM | POA: Diagnosis present

## 2015-03-29 DIAGNOSIS — E8889 Other specified metabolic disorders: Secondary | ICD-10-CM | POA: Diagnosis present

## 2015-03-29 DIAGNOSIS — R05 Cough: Secondary | ICD-10-CM | POA: Diagnosis not present

## 2015-03-29 DIAGNOSIS — I120 Hypertensive chronic kidney disease with stage 5 chronic kidney disease or end stage renal disease: Secondary | ICD-10-CM | POA: Diagnosis not present

## 2015-03-29 DIAGNOSIS — E876 Hypokalemia: Secondary | ICD-10-CM | POA: Diagnosis present

## 2015-03-29 DIAGNOSIS — I5043 Acute on chronic combined systolic (congestive) and diastolic (congestive) heart failure: Secondary | ICD-10-CM | POA: Diagnosis present

## 2015-03-29 DIAGNOSIS — I132 Hypertensive heart and chronic kidney disease with heart failure and with stage 5 chronic kidney disease, or end stage renal disease: Secondary | ICD-10-CM | POA: Diagnosis not present

## 2015-03-29 DIAGNOSIS — D649 Anemia, unspecified: Secondary | ICD-10-CM | POA: Diagnosis not present

## 2015-03-29 DIAGNOSIS — Z7982 Long term (current) use of aspirin: Secondary | ICD-10-CM | POA: Diagnosis not present

## 2015-03-29 DIAGNOSIS — I10 Essential (primary) hypertension: Secondary | ICD-10-CM | POA: Diagnosis present

## 2015-03-29 DIAGNOSIS — Z809 Family history of malignant neoplasm, unspecified: Secondary | ICD-10-CM | POA: Diagnosis not present

## 2015-03-29 DIAGNOSIS — E1122 Type 2 diabetes mellitus with diabetic chronic kidney disease: Secondary | ICD-10-CM | POA: Diagnosis present

## 2015-03-29 DIAGNOSIS — J449 Chronic obstructive pulmonary disease, unspecified: Secondary | ICD-10-CM | POA: Diagnosis not present

## 2015-03-29 DIAGNOSIS — N185 Chronic kidney disease, stage 5: Secondary | ICD-10-CM | POA: Diagnosis present

## 2015-03-29 DIAGNOSIS — I429 Cardiomyopathy, unspecified: Secondary | ICD-10-CM | POA: Diagnosis present

## 2015-03-29 DIAGNOSIS — Z95810 Presence of automatic (implantable) cardiac defibrillator: Secondary | ICD-10-CM | POA: Diagnosis not present

## 2015-03-29 DIAGNOSIS — E785 Hyperlipidemia, unspecified: Secondary | ICD-10-CM | POA: Diagnosis present

## 2015-03-29 DIAGNOSIS — E11319 Type 2 diabetes mellitus with unspecified diabetic retinopathy without macular edema: Secondary | ICD-10-CM | POA: Diagnosis present

## 2015-03-29 DIAGNOSIS — E8770 Fluid overload, unspecified: Secondary | ICD-10-CM | POA: Diagnosis not present

## 2015-03-29 DIAGNOSIS — R112 Nausea with vomiting, unspecified: Secondary | ICD-10-CM | POA: Diagnosis present

## 2015-03-29 LAB — I-STAT TROPONIN, ED: Troponin i, poc: 0.16 ng/mL (ref 0.00–0.08)

## 2015-03-29 LAB — GLUCOSE, CAPILLARY
Glucose-Capillary: 206 mg/dL — ABNORMAL HIGH (ref 65–99)
Glucose-Capillary: 254 mg/dL — ABNORMAL HIGH (ref 65–99)

## 2015-03-29 LAB — URINE MICROSCOPIC-ADD ON
Bacteria, UA: NONE SEEN
RBC / HPF: NONE SEEN RBC/hpf (ref 0–5)
SQUAMOUS EPITHELIAL / LPF: NONE SEEN
WBC UA: NONE SEEN WBC/hpf (ref 0–5)

## 2015-03-29 LAB — COMPREHENSIVE METABOLIC PANEL
ALT: 23 U/L (ref 17–63)
AST: 23 U/L (ref 15–41)
Albumin: 3.4 g/dL — ABNORMAL LOW (ref 3.5–5.0)
Alkaline Phosphatase: 84 U/L (ref 38–126)
Anion gap: 17 — ABNORMAL HIGH (ref 5–15)
BUN: 128 mg/dL — AB (ref 6–20)
CHLORIDE: 93 mmol/L — AB (ref 101–111)
CO2: 25 mmol/L (ref 22–32)
CREATININE: 5.74 mg/dL — AB (ref 0.61–1.24)
Calcium: 8.6 mg/dL — ABNORMAL LOW (ref 8.9–10.3)
GFR calc Af Amer: 10 mL/min — ABNORMAL LOW (ref >60)
GFR, EST NON AFRICAN AMERICAN: 9 mL/min — AB (ref >60)
GLUCOSE: 171 mg/dL — AB (ref 65–99)
Potassium: 3.4 mmol/L — ABNORMAL LOW (ref 3.5–5.1)
SODIUM: 135 mmol/L (ref 135–145)
Total Bilirubin: 1.2 mg/dL (ref 0.3–1.2)
Total Protein: 6.4 g/dL — ABNORMAL LOW (ref 6.5–8.1)

## 2015-03-29 LAB — CBC WITH DIFFERENTIAL/PLATELET
BASOS ABS: 0.1 10*3/uL (ref 0.0–0.1)
Basophils Relative: 1 %
EOS PCT: 1 %
Eosinophils Absolute: 0.1 10*3/uL (ref 0.0–0.7)
HCT: 34.9 % — ABNORMAL LOW (ref 39.0–52.0)
Hemoglobin: 12 g/dL — ABNORMAL LOW (ref 13.0–17.0)
LYMPHS PCT: 13 %
Lymphs Abs: 0.5 10*3/uL — ABNORMAL LOW (ref 0.7–4.0)
MCH: 29.2 pg (ref 26.0–34.0)
MCHC: 34.4 g/dL (ref 30.0–36.0)
MCV: 84.9 fL (ref 78.0–100.0)
Monocytes Absolute: 0.5 10*3/uL (ref 0.1–1.0)
Monocytes Relative: 12 %
Neutro Abs: 3 10*3/uL (ref 1.7–7.7)
Neutrophils Relative %: 73 %
PLATELETS: 177 10*3/uL (ref 150–400)
RBC: 4.11 MIL/uL — AB (ref 4.22–5.81)
RDW: 15.4 % (ref 11.5–15.5)
WBC: 4.1 10*3/uL (ref 4.0–10.5)

## 2015-03-29 LAB — URINALYSIS, ROUTINE W REFLEX MICROSCOPIC
Bilirubin Urine: NEGATIVE
Glucose, UA: NEGATIVE mg/dL
Hgb urine dipstick: NEGATIVE
KETONES UR: NEGATIVE mg/dL
LEUKOCYTES UA: NEGATIVE
NITRITE: NEGATIVE
PH: 5.5 (ref 5.0–8.0)
PROTEIN: 100 mg/dL — AB
Specific Gravity, Urine: 1.01 (ref 1.005–1.030)

## 2015-03-29 LAB — AMMONIA: AMMONIA: 38 umol/L — AB (ref 9–35)

## 2015-03-29 LAB — BRAIN NATRIURETIC PEPTIDE: B Natriuretic Peptide: >4500 pg/mL — ABNORMAL HIGH (ref 0.0–100.0)

## 2015-03-29 LAB — LIPASE, BLOOD: LIPASE: 37 U/L (ref 11–51)

## 2015-03-29 MED ORDER — COLCHICINE 0.6 MG PO TABS
0.6000 mg | ORAL_TABLET | ORAL | Status: DC | PRN
  Administered 2015-03-29: 0.6 mg via ORAL
  Filled 2015-03-29: qty 1

## 2015-03-29 MED ORDER — METOPROLOL SUCCINATE ER 25 MG PO TB24
25.0000 mg | ORAL_TABLET | Freq: Every day | ORAL | Status: DC
  Administered 2015-03-29 – 2015-03-31 (×3): 25 mg via ORAL
  Filled 2015-03-29 (×3): qty 1

## 2015-03-29 MED ORDER — SODIUM CHLORIDE 0.9 % IV SOLN: 250.0000 mL | INTRAVENOUS | Status: DC | PRN

## 2015-03-29 MED ORDER — ALBUTEROL SULFATE HFA 108 (90 BASE) MCG/ACT IN AERS: 2.0000 | INHALATION_SPRAY | Freq: Four times a day (QID) | RESPIRATORY_TRACT | Status: DC | PRN

## 2015-03-29 MED ORDER — IPRATROPIUM-ALBUTEROL 0.5-2.5 (3) MG/3ML IN SOLN: 3.0000 mL | RESPIRATORY_TRACT | Status: DC

## 2015-03-29 MED ORDER — CLONIDINE HCL 0.2 MG PO TABS
0.3000 mg | ORAL_TABLET | Freq: Every day | ORAL | Status: DC
  Administered 2015-03-29 – 2015-03-31 (×3): 0.3 mg via ORAL
  Filled 2015-03-29 (×3): qty 1

## 2015-03-29 MED ORDER — ONDANSETRON HCL 4 MG PO TABS: 4.0000 mg | ORAL_TABLET | Freq: Four times a day (QID) | ORAL | Status: DC | PRN

## 2015-03-29 MED ORDER — ONDANSETRON HCL 4 MG/2ML IJ SOLN: 4.0000 mg | Freq: Four times a day (QID) | INTRAMUSCULAR | Status: DC | PRN

## 2015-03-29 MED ORDER — CALCIUM ACETATE (PHOS BINDER) 667 MG PO CAPS
667.0000 mg | ORAL_CAPSULE | Freq: Every day | ORAL | Status: DC
Start: 2015-03-29 — End: 2015-03-31
  Administered 2015-03-29 – 2015-03-31 (×3): 667 mg via ORAL
  Filled 2015-03-29 (×3): qty 1

## 2015-03-29 MED ORDER — INSULIN GLARGINE 100 UNIT/ML ~~LOC~~ SOLN
30.0000 [IU] | Freq: Every day | SUBCUTANEOUS | Status: DC
  Administered 2015-03-29: 30 [IU] via SUBCUTANEOUS
  Administered 2015-03-30 – 2015-03-31 (×2): 15 [IU] via SUBCUTANEOUS
  Filled 2015-03-29 (×3): qty 0.3

## 2015-03-29 MED ORDER — ASPIRIN EC 81 MG PO TBEC
81.0000 mg | DELAYED_RELEASE_TABLET | Freq: Every day | ORAL | Status: DC
  Administered 2015-03-29 – 2015-03-31 (×3): 81 mg via ORAL
  Filled 2015-03-29 (×3): qty 1

## 2015-03-29 MED ORDER — CALCITRIOL 0.25 MCG PO CAPS
0.2500 ug | ORAL_CAPSULE | Freq: Every day | ORAL | Status: DC
  Administered 2015-03-29 – 2015-03-31 (×3): 0.25 ug via ORAL
  Filled 2015-03-29 (×3): qty 1

## 2015-03-29 MED ORDER — IPRATROPIUM-ALBUTEROL 0.5-2.5 (3) MG/3ML IN SOLN
3.0000 mL | Freq: Once | RESPIRATORY_TRACT | Status: AC
  Administered 2015-03-29: 3 mL via RESPIRATORY_TRACT
  Filled 2015-03-29: qty 3

## 2015-03-29 MED ORDER — FUROSEMIDE 10 MG/ML IJ SOLN
160.0000 mg | Freq: Two times a day (BID) | INTRAVENOUS | Status: DC
  Administered 2015-03-29 – 2015-03-30 (×2): 160 mg via INTRAVENOUS
  Filled 2015-03-29 (×2): qty 16

## 2015-03-29 MED ORDER — ACETAMINOPHEN 650 MG RE SUPP
650.0000 mg | Freq: Four times a day (QID) | RECTAL | Status: DC | PRN
  Filled 2015-03-29: qty 1

## 2015-03-29 MED ORDER — ENOXAPARIN SODIUM 30 MG/0.3ML ~~LOC~~ SOLN
30.0000 mg | SUBCUTANEOUS | Status: DC
  Administered 2015-03-29 – 2015-03-30 (×2): 30 mg via SUBCUTANEOUS
  Filled 2015-03-29 (×2): qty 0.3

## 2015-03-29 MED ORDER — ALBUTEROL SULFATE (2.5 MG/3ML) 0.083% IN NEBU
2.5000 mg | INHALATION_SOLUTION | Freq: Four times a day (QID) | RESPIRATORY_TRACT | Status: DC | PRN
Start: 2015-03-29 — End: 2015-03-31
  Administered 2015-03-31: 2.5 mg via RESPIRATORY_TRACT
  Filled 2015-03-29: qty 3

## 2015-03-29 MED ORDER — ONDANSETRON HCL 4 MG/2ML IJ SOLN
4.0000 mg | Freq: Once | INTRAMUSCULAR | Status: AC
  Administered 2015-03-29: 4 mg via INTRAVENOUS
  Filled 2015-03-29: qty 2

## 2015-03-29 MED ORDER — SODIUM CHLORIDE 0.9% FLUSH
3.0000 mL | Freq: Two times a day (BID) | INTRAVENOUS | Status: DC
  Administered 2015-03-29 – 2015-03-31 (×4): 3 mL via INTRAVENOUS

## 2015-03-29 MED ORDER — FUROSEMIDE 10 MG/ML IJ SOLN
80.0000 mg | Freq: Once | INTRAMUSCULAR | Status: AC
  Administered 2015-03-29: 80 mg via INTRAVENOUS
  Filled 2015-03-29: qty 8

## 2015-03-29 MED ORDER — POTASSIUM CHLORIDE CRYS ER 20 MEQ PO TBCR
30.0000 meq | EXTENDED_RELEASE_TABLET | Freq: Two times a day (BID) | ORAL | Status: DC
  Administered 2015-03-29 – 2015-03-31 (×5): 30 meq via ORAL
  Filled 2015-03-29 (×5): qty 1

## 2015-03-29 MED ORDER — HYDRALAZINE HCL 25 MG PO TABS
100.0000 mg | ORAL_TABLET | Freq: Two times a day (BID) | ORAL | Status: DC
  Administered 2015-03-29 – 2015-03-31 (×4): 100 mg via ORAL
  Filled 2015-03-29 (×4): qty 4

## 2015-03-29 MED ORDER — SENNOSIDES-DOCUSATE SODIUM 8.6-50 MG PO TABS: 1.0000 | ORAL_TABLET | Freq: Every evening | ORAL | Status: DC | PRN

## 2015-03-29 MED ORDER — ACETAMINOPHEN 325 MG PO TABS
650.0000 mg | ORAL_TABLET | Freq: Four times a day (QID) | ORAL | Status: DC | PRN
  Administered 2015-03-29 – 2015-03-31 (×2): 650 mg via ORAL
  Filled 2015-03-29 (×2): qty 2

## 2015-03-29 MED ORDER — SODIUM CHLORIDE 0.9% FLUSH: 3.0000 mL | INTRAVENOUS | Status: DC | PRN

## 2015-03-29 NOTE — ED Provider Notes (Signed)
CSN: [id number]     Arrival date & time 03/29/15  [id number] History   By signing my name below, I, Jolayne Panther, attest that this documentation has been prepared under the direction and in the presence of Merrily Pew, MD. Electronically Signed: Jolayne Panther, Scribe. 03/29/2015. 10:21 AM.   Chief Complaint  Patient presents with  . Emesis   The history is provided by the patient. No language interpreter was used.   HPI Comments: [name] is a 70 y.o. male who presents to the Emergency Department complaining of sudden onset of a cough and vomiting which began last night. He also notes associated diarrhea, nausea, and ankle swelling and states that he had an episode of dark, tarry, nonmalodorous stool about 9 days ago after taking pepto bismol. Pt last vomited while at the ED. He denies recent sick contact, rash, hematemesis, blood in his diarrhea, skin color change, fever or generalized pain. Pt is still able to make urine.   Past Medical History  Diagnosis Date  . Chronic combined systolic and diastolic CHF (congestive heart failure) ([place])     a. 02/2014 Echo: EF 20-25% (new), Gr 2 DD, mod conc LVH, mild AI/AS, mod MR, sev dil LA, mild TR.  Marland Kitchen Nonischemic cardiomyopathy ([place])     a. 02/2014 Echo: EF 20-25%;  b. 03/2014 Cath: LM nl, LAD min irregs, LCX   . Hypertension   . Chronic kidney disease (CKD), stage IV (severe) ([place]) 12/2009    a. baseline creat now ~ 4.  . Erectile dysfunction   . Colonic polyp   . Low serum testosterone level   . Tobacco abuse   . Benign prostatic hypertrophy   . Obesity, Class I, BMI 30-34.9   . Hyperlipidemia   . Adenomatous colon polyp 2006  . H. pylori infection 2013    treated with prevpac  . Mild Ao Stenosis and Insufficiency   . AICD (automatic cardioverter/defibrillator) present     STJ device, implanted 12/03/14 Dr. Lovena Le  . Sleep apnea 2010    Initially unable to afford CPAP  . Diabetes mellitus type II     with retiopathy and  nephropathy   . Gout   . CHF (congestive heart failure) ([place])   . Hyperparathyroidism [facility])    Past Surgical History  Procedure Laterality Date  . Retinal laser procedure      diabetic retinopathy  . Lymph node biopsy      surgical exploration of neck-not entirely clear that this represented a lymph node biopsy  . Colonoscopy  08/23/2004    Dr. Vivi Ferns rectum, diminutive polyp of the rectosigmoid removed, inflamed focally adenomatous polyp  . Left and right heart catheterization with coronary angiogram N/A 03/19/2014    Procedure: LEFT AND RIGHT HEART CATHETERIZATION WITH CORONARY ANGIOGRAM;  Surgeon: Blane Ohara, MD;  Location: [facility] CATH LAB;  Service: Cardiovascular;  Laterality: N/A;  . Cardiac catheterization    . Ep implantable device N/A 12/03/2014    Procedure: ICD Implant;  Surgeon: Evans Lance, MD;  Location: [place] CV LAB;  Service: Cardiovascular;  Laterality: N/A;; St Jude  . Esophagogastroduodenoscopy  2013    Dr. Gala Romney: erosions reminiscent of GAVE but biopsies showed H.pylori gastritis  . Colonoscopy  2013    Dr. Gala Romney: multiple diminutive polyps in distal sigmoid segment, 4 mm pedunculated polyp at hepatic flexure. Tubular adenomas. Surveillance due 2018   . Cholecystectomy N/A 01/07/2015    Procedure: LAPAROSCOPIC CHOLECYSTECTOMY WITH INTRAOPERATIVE CHOLANGIOGRAM;  Surgeon: Donnie Mesa, MD;  Location: [place];  Service: General;  Laterality: N/A;  . Umbilical hernia repair N/A 01/07/2015    Procedure: HERNIA REPAIR UMBILICAL ADULT;  Surgeon: Donnie Mesa, MD;  Location: [place];  Service: General;  Laterality: N/A;  . Ercp N/A 01/09/2015    Procedure: ENDOSCOPIC RETROGRADE CHOLANGIOPANCREATOGRAPHY (ERCP);  Surgeon: Clarene Essex, MD;  Location: [facility] ENDOSCOPY;  Service: Endoscopy;  Laterality: N/A;   Family History  Problem Relation Age of Onset  . Hypertension Mother   . Cancer Mother   . Cancer Father   . Diabetes Sister    Social History  Substance Use  Topics  . Smoking status: Current Some Day Smoker -- 0.25 packs/day for 56 years    Types: Cigarettes    Start date: 05/31/1958  . Smokeless tobacco: Never Used  . Alcohol Use: No    Review of Systems  Constitutional: Negative for fever.  Respiratory: Positive for cough.   Gastrointestinal: Positive for nausea, vomiting and diarrhea. Negative for blood in stool.  Genitourinary: Negative for difficulty urinating.  Musculoskeletal: Negative for myalgias.  Skin: Negative for pallor and rash.  All other systems reviewed and are negative.  Allergies  Ace inhibitors  Home Medications   Prior to Admission medications   Medication Sig Start Date End Date Taking? Authorizing Provider  albuterol (PROVENTIL HFA;VENTOLIN HFA) 108 (90 Base) MCG/ACT inhaler Inhale 2 puffs into the lungs every 6 (six) hours as needed for wheezing or shortness of breath. 01/16/15   Susy Frizzle, MD  albuterol (PROVENTIL) (2.5 MG/3ML) 0.083% nebulizer solution Take 3 mLs (2.5 mg total) by nebulization every 6 (six) hours as needed for wheezing. 01/30/15   Susy Frizzle, MD  aspirin EC 81 MG tablet Take 81 mg by mouth daily.    Historical Provider, MD  calcitRIOL (ROCALTROL) 0.25 MCG capsule Take 1 capsule (0.25 mcg total) by mouth daily. 01/11/15   Annita Brod, MD  calcium acetate, Phos Binder, (PHOSLYRA) 667 MG/5ML SOLN Take 667 mg by mouth daily.    Historical Provider, MD  cloNIDine (CATAPRES) 0.3 MG tablet Take 1 tablet (0.3 mg total) by mouth daily. 01/08/15   Susy Frizzle, MD  colchicine 0.6 MG tablet Take 0.6 mg by mouth as needed.    Historical Provider, MD  diphenhydramine-acetaminophen (TYLENOL PM EXTRA STRENGTH) 25-500 MG TABS tablet Take 1 tablet by mouth at bedtime as needed.    Historical Provider, MD  hydrALAZINE (APRESOLINE) 100 MG tablet Take 1 tablet (100 mg total) by mouth 2 (two) times daily. 01/11/15   Annita Brod, MD  Insulin Glargine (LANTUS SOLOSTAR) 100 UNIT/ML Solostar  Pen Inject 30 Units into the skin daily at 10 pm. Patient taking differently: Inject 30 Units into the skin every morning.  02/24/15   Susy Frizzle, MD  metolazone (ZAROXOLYN) 5 MG tablet Take 1 tablet (5 mg total) by mouth daily. 03/05/15   [name], MD  metoprolol succinate (TOPROL-XL) 25 MG 24 hr tablet Take 1 tablet (25 mg total) by mouth daily. 01/08/15   Susy Frizzle, MD  potassium chloride (KLOR-CON) 20 MEQ packet Take by mouth 2 (two) times daily.    Historical Provider, MD  sorbitol 70 % solution Take 15 mLs by mouth daily as needed. For constipation - PRN    Historical Provider, MD  torsemide (DEMADEX) 100 MG tablet Take 1.5 tablets (150 mg total) by mouth 2 (two) times daily. 03/02/15   Susy Frizzle, MD  There were no vitals taken for this visit. Physical Exam  Constitutional: He is oriented to person, place, and time. He appears well-developed and well-nourished. No distress.  HENT:  Head: Normocephalic and atraumatic.  Eyes: Conjunctivae are normal.  Neck: JVD present.  Some JVD up to about the angle of the jaw  Cardiovascular: Normal rate.   Murmur heard. Pulmonary/Chest: Effort normal. He has wheezes.  2 out of 6 systolic heart murmer best heard at right upper, decreased breath sounds and diffuse wheezing but no crackles, no rhonchi  Abdominal: Soft. He exhibits no distension and no mass. There is no tenderness. There is no rebound and no guarding.  Musculoskeletal:  bilateral lower extremity edema pitting to just below the knee.  Neurological: He is alert and oriented to person, place, and time.  Skin: Skin is warm and dry.  Psychiatric: He has a normal mood and affect.  Nursing note and vitals reviewed.  ED Course  Procedures  DIAGNOSTIC STUDIES:    Oxygen Saturation is 98% on RA, normal by my interpretation.   COORDINATION OF CARE:  10:19 AM Will administer breathing treatment and will order imaging and labs. Discussed treatment plan with pt at  bedside and pt agreed to plan.   Labs Review Labs Reviewed  CBC WITH DIFFERENTIAL/PLATELET - Abnormal; Notable for the following:    RBC 4.11 (*)    Hemoglobin 12.0 (*)    HCT 34.9 (*)    Lymphs Abs 0.5 (*)    All other components within normal limits  COMPREHENSIVE METABOLIC PANEL - Abnormal; Notable for the following:    Potassium 3.4 (*)    Chloride 93 (*)    Glucose, Bld 171 (*)    BUN 128 (*)    Creatinine, Ser 5.74 (*)    Calcium 8.6 (*)    Total Protein 6.4 (*)    Albumin 3.4 (*)    GFR calc non Af Amer 9 (*)    GFR calc Af Amer 10 (*)    Anion gap 17 (*)    All other components within normal limits  BRAIN NATRIURETIC PEPTIDE - Abnormal; Notable for the following:    B Natriuretic Peptide >4500.0 (*)    All other components within normal limits  AMMONIA - Abnormal; Notable for the following:    Ammonia 38 (*)    All other components within normal limits  I-STAT TROPOININ, ED - Abnormal; Notable for the following:    Troponin i, poc 0.16 (*)    All other components within normal limits  LIPASE, BLOOD  URINALYSIS, ROUTINE W REFLEX MICROSCOPIC (NOT AT [facility])    Imaging Review Dg Chest 2 View  03/29/2015  CLINICAL DATA:  Cough for 2 days. EXAM: CHEST  2 VIEW COMPARISON:  03/08/2015 FINDINGS: Left pacer/ defibrillator in place with the tip in the right ventricle. Cardiomegaly. Lungs are clear. No effusions or acute bony abnormality. IMPRESSION: Cardiomegaly.  No acute findings. Electronically Signed   By: Rolm Baptise M.D.   On: 03/29/2015 10:54   I have personally reviewed and evaluated these images and lab results as part of my medical decision-making.   EKG Interpretation   Date/Time:  Sunday March 29 2015 10:33:30 EDT Ventricular Rate:  86 PR Interval:  176 QRS Duration: 116 QT Interval:  403 QTC Calculation: 482 R Axis:   0 Text Interpretation:  Sinus rhythm Ventricular premature complex Probable  left atrial enlargement LVH with secondary repolarization  abnormality  Probable anterior infarct, age indeterminate Baseline wander  in lead(s) II  III aVF No significant change since last tracing Confirmed by [facility] MD,  Corene Cornea [phone]) on 03/29/2015 11:04:19 AM      MDM   Final diagnoses:  Hypervolemia, unspecified hypervolemia type  Acute on chronic combined systolic and diastolic CHF (congestive heart failure) (HCC)  CKD (chronic kidney disease) stage 5, GFR less than 15 ml/min [facility])   70 yo M here with worsening uremia likely causing vomiting and diarrhea. Also with fluid overload on exam, labs and imaging. No ECG changes, doubt primary cardiac etiology (however it is likely contributing with his EF of 25%), more likely renal cause. D/w nephrology who recommends admission for attempted diuresis and consideration of dialysis otherwise.    I personally performed the services described in this documentation, which was scribed in my presence. The recorded information has been reviewed and is accurate.    Merrily Pew, MD 03/31/15 [id number]

## 2015-03-29 NOTE — Consult Note (Signed)
Reason for Consult: Chronic renal failure and fluid overload Referring Physician: Dr. Judye Bos is an 70 y.o. male.  HPI: He is a patient with history of hypertension, chronic renal failure stage V, history of coronary artery disease status post ICD placement presently came with complaints of nausea, vomiting, diarrhea for the last 24 hours. Patient also complains of difficulty breathing, orthopnea and increasing leg swelling. Presently patient states that he's feeling much better. Patient is being followed by [facility] and was told he may require dialysis very soon.  Past Medical History  Diagnosis Date  . Chronic combined systolic and diastolic CHF (congestive heart failure) ([place])     a. 02/2014 Echo: EF 20-25% (new), Gr 2 DD, mod conc LVH, mild AI/AS, mod MR, sev dil LA, mild TR.  Marland Kitchen Nonischemic cardiomyopathy ([place])     a. 02/2014 Echo: EF 20-25%;  b. 03/2014 Cath: LM nl, LAD min irregs, LCX   . Hypertension   . Chronic kidney disease (CKD), stage IV (severe) ([place]) 12/2009    a. baseline creat now ~ 4.  . Erectile dysfunction   . Colonic polyp   . Low serum testosterone level   . Tobacco abuse   . Benign prostatic hypertrophy   . Obesity, Class I, BMI 30-34.9   . Hyperlipidemia   . Adenomatous colon polyp 2006  . H. pylori infection 2013    treated with prevpac  . Mild Ao Stenosis and Insufficiency   . AICD (automatic cardioverter/defibrillator) present     STJ device, implanted 12/03/14 Dr. Lovena Le  . Sleep apnea 2010    Initially unable to afford CPAP  . 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 Gout   . CHF (congestive heart failure) ([place])   . Hyperparathyroidism [facility])     Past Surgical History  Procedure Laterality Date  . Retinal laser procedure      diabetic retinopathy  . Lymph node biopsy      surgical exploration of neck-not entirely clear that this represented a lymph node biopsy  . Colonoscopy  08/23/2004    Dr.  Vivi Ferns rectum, diminutive polyp of the rectosigmoid removed, inflamed focally adenomatous polyp  . Left and right heart catheterization with coronary angiogram N/A 03/19/2014    Procedure: LEFT AND RIGHT HEART CATHETERIZATION WITH CORONARY ANGIOGRAM;  Surgeon: Blane Ohara, MD;  Location: [facility] CATH LAB;  Service: Cardiovascular;  Laterality: N/A;  . Cardiac catheterization    . Ep implantable device N/A 12/03/2014    Procedure: ICD Implant;  Surgeon: Evans Lance, MD;  Location: [place] CV LAB;  Service: Cardiovascular;  Laterality: N/A;; St Jude  . Esophagogastroduodenoscopy  2013    Dr. Gala Romney: erosions reminiscent of GAVE but biopsies showed H.pylori gastritis  . Colonoscopy  2013    Dr. Gala Romney: multiple diminutive polyps in distal sigmoid segment, 4 mm pedunculated polyp at hepatic flexure. Tubular adenomas. Surveillance due 2018   . Cholecystectomy N/A 01/07/2015    Procedure: LAPAROSCOPIC CHOLECYSTECTOMY WITH INTRAOPERATIVE CHOLANGIOGRAM;  Surgeon: Donnie Mesa, MD;  Location: [place];  Service: General;  Laterality: N/A;  . Umbilical hernia repair N/A 01/07/2015    Procedure: HERNIA REPAIR UMBILICAL ADULT;  Surgeon: Donnie Mesa, MD;  Location: [place];  Service: General;  Laterality: N/A;  . Ercp N/A 01/09/2015    Procedure: ENDOSCOPIC RETROGRADE CHOLANGIOPANCREATOGRAPHY (ERCP);  Surgeon: Clarene Essex, MD;  Location: [facility] ENDOSCOPY;  Service: Endoscopy;  Laterality: N/A;  Family History  Problem Relation Age of Onset  . Hypertension Mother   . Cancer Mother   . Cancer Father   . Diabetes Sister     Social History:  reports that he has been smoking Cigarettes.  He started smoking about 56 years ago. He has a 14 pack-year smoking history. He has never used smokeless tobacco. He reports that he does not drink alcohol or use illicit drugs.  Allergies:  Allergies  Allergen Reactions  . Ace Inhibitors Cough    Medications: I have reviewed the patient's current  medications.  Results for orders placed or performed during the hospital encounter of 03/29/15 (from the past 48 hour(s))  CBC with Differential     Status: Abnormal   Collection Time: 03/29/15 10:30 AM  Result Value Ref Range   WBC 4.1 4.0 - 10.5 K/uL   RBC 4.11 (L) 4.22 - 5.81 MIL/uL   Hemoglobin 12.0 (L) 13.0 - 17.0 g/dL   HCT 34.9 (L) 39.0 - 52.0 %   MCV 84.9 78.0 - 100.0 fL   MCH 29.2 26.0 - 34.0 pg   MCHC 34.4 30.0 - 36.0 g/dL   RDW 15.4 11.5 - 15.5 %   Platelets 177 150 - 400 K/uL   Neutrophils Relative % 73 %   Neutro Abs 3.0 1.7 - 7.7 K/uL   Lymphocytes Relative 13 %   Lymphs Abs 0.5 (L) 0.7 - 4.0 K/uL   Monocytes Relative 12 %   Monocytes Absolute 0.5 0.1 - 1.0 K/uL   Eosinophils Relative 1 %   Eosinophils Absolute 0.1 0.0 - 0.7 K/uL   Basophils Relative 1 %   Basophils Absolute 0.1 0.0 - 0.1 K/uL  Comprehensive metabolic panel     Status: Abnormal   Collection Time: 03/29/15 10:30 AM  Result Value Ref Range   Sodium 135 135 - 145 mmol/L   Potassium 3.4 (L) 3.5 - 5.1 mmol/L   Chloride 93 (L) 101 - 111 mmol/L   CO2 25 22 - 32 mmol/L   Glucose, Bld 171 (H) 65 - 99 mg/dL   BUN 128 (H) 6 - 20 mg/dL    Comment: RESULTS CONFIRMED BY MANUAL DILUTION   Creatinine, Ser 5.74 (H) 0.61 - 1.24 mg/dL   Calcium 8.6 (L) 8.9 - 10.3 mg/dL   Total Protein 6.4 (L) 6.5 - 8.1 g/dL   Albumin 3.4 (L) 3.5 - 5.0 g/dL   AST 23 15 - 41 U/L   ALT 23 17 - 63 U/L   Alkaline Phosphatase 84 38 - 126 U/L   Total Bilirubin 1.2 0.3 - 1.2 mg/dL   GFR calc non Af Amer 9 (L) >60 mL/min   GFR calc Af Amer 10 (L) >60 mL/min    Comment: (NOTE) The eGFR has been calculated using the CKD EPI equation. This calculation has not been validated in all clinical situations. eGFR's persistently <60 mL/min signify possible Chronic Kidney Disease.    Anion gap 17 (H) 5 - 15  Brain natriuretic peptide     Status: Abnormal   Collection Time: 03/29/15 10:30 AM  Result Value Ref Range   B Natriuretic Peptide  >4500.0 (H) 0.0 - 100.0 pg/mL  Ammonia     Status: Abnormal   Collection Time: 03/29/15 10:30 AM  Result Value Ref Range   Ammonia 38 (H) 9 - 35 umol/L  Lipase, blood     Status: None   Collection Time: 03/29/15 10:30 AM  Result Value Ref Range   Lipase 37 11 -  51 U/L  I-stat troponin, ED     Status: Abnormal   Collection Time: 03/29/15 10:35 AM  Result Value Ref Range   Troponin i, poc 0.16 (HH) 0.00 - 0.08 ng/mL   Comment NOTIFIED PHYSICIAN    Comment 3            Comment: Due to the release kinetics of cTnI, a negative result within the first hours of the onset of symptoms does not rule out myocardial infarction with certainty. If myocardial infarction is still suspected, repeat the test at appropriate intervals.     Dg Chest 2 View  03/29/2015  CLINICAL DATA:  Cough for 2 days. EXAM: CHEST  2 VIEW COMPARISON:  03/08/2015 FINDINGS: Left pacer/ defibrillator in place with the tip in the right ventricle. Cardiomegaly. Lungs are clear. No effusions or acute bony abnormality. IMPRESSION: Cardiomegaly.  No acute findings. Electronically Signed   By: Rolm Baptise M.D.   On: 03/29/2015 10:54    Review of Systems  Constitutional: Positive for malaise/fatigue. Negative for fever and chills.  Respiratory: Positive for shortness of breath.   Cardiovascular: Positive for orthopnea and leg swelling. Negative for chest pain.  Gastrointestinal: Positive for nausea, vomiting and diarrhea.  Neurological: Positive for weakness.   Blood pressure 159/57, pulse 83, temperature 98.4 F (36.9 C), temperature source Oral, resp. rate 21, SpO2 97 %. Physical Exam  Constitutional: He is oriented to person, place, and time. No distress.  Eyes: No scleral icterus.  Neck: JVD present.  Cardiovascular: Normal rate and regular rhythm.   Respiratory: He has wheezes. He has rales.  GI: He exhibits no distension. There is no tenderness. There is no rebound.  Musculoskeletal: He exhibits edema.   Neurological: He is alert and oriented to person, place, and time.    Assessment/Plan: Problem #1 chronic renal failure: Stage V. Presently came with complaints of nausea, vomiting and also some diarrhea. At this moment no sure whether this is secondary to uremia or gastroenteritis. Patient didn't have any diarrhea since he came in was not taking any antibiotics. Problem #2 difficulty breathing: Patient presently with leg edema and signs of fluid overload. He has been on Demadex and metolazone as an outpatient. Problem #3 metabolic bone disease: Patient on Rocaltrol and phosphorus as an outpatient. Problem #4 hypertension: His blood pressure is reasonably controlled Problem #5 hypokalemia: Most likely from diuretics Problem #6 anemia: His hemoglobin is within our target goal Problem #7 history of diabetes Problem #8 coronary artery disease: Patient with ejection fraction of 25%. Patient is status post ICD placement. Plan: We'll start patient on Lasix 160 mg IV twice a day 2] will start on KCl 40 mEq by mouth twice a day 3]. Patient renal function doesn't improve patient may require dialysis with catheter placement. 4] we'll check renal panel in the morning.  Deziyah Arvin S 03/29/2015, 12:05 PM

## 2015-03-29 NOTE — ED Notes (Signed)
Patient placed on 2LPM on o2 via [place]. sats increased to 96%.

## 2015-03-29 NOTE — H&P (Signed)
[facility]          History and Physical    PCP:   Odette Fraction, MD   EDP: Merrily Pew, MD  Chief Complaint:  Fatigue, nausea, vomiting  HPI: 70 y/o with CKD Stage V nearing HD, CAD s/p ICD here with n/v that began last night. Also with increasing LE edema and SOB and some orthopnea. Found to have a BUN of 128, Cr 5.74, BNP >4500. We have been asked to admit him for further evaluation and management.  Allergies:   Allergies  Allergen Reactions  . Ace Inhibitors Cough      Past Medical History  Diagnosis Date  . Chronic combined systolic and diastolic CHF (congestive heart failure) ([place])     a. 02/2014 Echo: EF 20-25% (new), Gr 2 DD, mod conc LVH, mild AI/AS, mod MR, sev dil LA, mild TR.  Marland Kitchen Nonischemic cardiomyopathy ([place])     a. 02/2014 Echo: EF 20-25%;  b. 03/2014 Cath: LM nl, LAD min irregs, LCX   . Hypertension   . Chronic kidney disease (CKD), stage IV (severe) ([place]) 12/2009    a. baseline creat now ~ 4.  . Erectile dysfunction   . Colonic polyp   . Low serum testosterone level   . Tobacco abuse   . Benign prostatic hypertrophy   . Obesity, Class I, BMI 30-34.9   . Hyperlipidemia   . Adenomatous colon polyp 2006  . H. pylori infection 2013    treated with prevpac  . Mild Ao Stenosis and Insufficiency   . AICD (automatic cardioverter/defibrillator) present     STJ device, implanted 12/03/14 Dr. Lovena Le  . Sleep apnea 2010    Initially unable to afford CPAP  . 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 Gout   . CHF (congestive heart failure) ([place])   . Hyperparathyroidism [facility])     Past Surgical History  Procedure Laterality Date  . Retinal laser procedure      diabetic retinopathy  . Lymph node biopsy      surgical exploration of neck-not entirely clear that this represented a lymph node biopsy  . Colonoscopy  08/23/2004    Dr. Vivi Ferns rectum, diminutive polyp of the rectosigmoid removed, inflamed  focally adenomatous polyp  . Left and right heart catheterization with coronary angiogram N/A 03/19/2014    Procedure: LEFT AND RIGHT HEART CATHETERIZATION WITH CORONARY ANGIOGRAM;  Surgeon: Blane Ohara, MD;  Location: [facility] CATH LAB;  Service: Cardiovascular;  Laterality: N/A;  . Cardiac catheterization    . Ep implantable device N/A 12/03/2014    Procedure: ICD Implant;  Surgeon: Evans Lance, MD;  Location: [place] CV LAB;  Service: Cardiovascular;  Laterality: N/A;; St Jude  . Esophagogastroduodenoscopy  2013    Dr. Gala Romney: erosions reminiscent of GAVE but biopsies showed H.pylori gastritis  . Colonoscopy  2013    Dr. Gala Romney: multiple diminutive polyps in distal sigmoid segment, 4 mm pedunculated polyp at hepatic flexure. Tubular adenomas. Surveillance due 2018   . Cholecystectomy N/A 01/07/2015    Procedure: LAPAROSCOPIC CHOLECYSTECTOMY WITH INTRAOPERATIVE CHOLANGIOGRAM;  Surgeon: Donnie Mesa, MD;  Location: [place];  Service: General;  Laterality: N/A;  . Umbilical hernia repair N/A 01/07/2015    Procedure: HERNIA REPAIR UMBILICAL ADULT;  Surgeon: Donnie Mesa, MD;  Location: [place];  Service: General;  Laterality: N/A;  . Ercp N/A 01/09/2015  Procedure: ENDOSCOPIC RETROGRADE CHOLANGIOPANCREATOGRAPHY (ERCP);  Surgeon: Clarene Essex, MD;  Location: [facility] ENDOSCOPY;  Service: Endoscopy;  Laterality: N/A;    Prior to Admission medications   Medication Sig Start Date End Date Taking? Authorizing Provider  albuterol (PROVENTIL HFA;VENTOLIN HFA) 108 (90 Base) MCG/ACT inhaler Inhale 2 puffs into the lungs every 6 (six) hours as needed for wheezing or shortness of breath. 01/16/15  Yes Susy Frizzle, MD  albuterol (PROVENTIL) (2.5 MG/3ML) 0.083% nebulizer solution Take 3 mLs (2.5 mg total) by nebulization every 6 (six) hours as needed for wheezing. 01/30/15  Yes Susy Frizzle, MD  aspirin EC 81 MG tablet Take 81 mg by mouth daily.   Yes Historical Provider, MD  calcitRIOL (ROCALTROL) 0.25  MCG capsule Take 1 capsule (0.25 mcg total) by mouth daily. 01/11/15  Yes Annita Brod, MD  calcium acetate (PHOSLO) 667 MG capsule Take 667 mg by mouth daily. Take with the biggest meal.   Yes Historical Provider, MD  cloNIDine (CATAPRES) 0.3 MG tablet Take 1 tablet (0.3 mg total) by mouth daily. 01/08/15  Yes Susy Frizzle, MD  colchicine 0.6 MG tablet Take 0.6 mg by mouth as needed (gout flare up).    Yes Historical Provider, MD  diphenhydramine-acetaminophen (TYLENOL PM EXTRA STRENGTH) 25-500 MG TABS tablet Take 1 tablet by mouth at bedtime as needed (pain/sleep).    Yes Historical Provider, MD  hydrALAZINE (APRESOLINE) 100 MG tablet Take 1 tablet (100 mg total) by mouth 2 (two) times daily. 01/11/15  Yes Annita Brod, MD  Insulin Glargine (LANTUS SOLOSTAR) 100 UNIT/ML Solostar Pen Inject 30 Units into the skin daily at 10 pm. Patient taking differently: Inject 30 Units into the skin every morning.  02/24/15  Yes Susy Frizzle, MD  metolazone (ZAROXOLYN) 5 MG tablet Take 1 tablet (5 mg total) by mouth daily. 03/05/15  Yes [name], MD  metoprolol succinate (TOPROL-XL) 25 MG 24 hr tablet Take 1 tablet (25 mg total) by mouth daily. 01/08/15  Yes Susy Frizzle, MD  potassium chloride SA (K-DUR,KLOR-CON) 20 MEQ tablet Take 20 mEq by mouth 2 (two) times daily.   Yes Historical Provider, MD  sorbitol 70 % solution Take 15 mLs by mouth daily as needed. For constipation - PRN   Yes Historical Provider, MD  torsemide (DEMADEX) 100 MG tablet Take 1.5 tablets (150 mg total) by mouth 2 (two) times daily. 03/02/15  Yes Susy Frizzle, MD    Social History:  reports that he has been smoking Cigarettes.  He started smoking about 56 years ago. He has a 14 pack-year smoking history. He has never used smokeless tobacco. He reports that he does not drink alcohol or use illicit drugs.  Family History  Problem Relation Age of Onset  . Hypertension Mother   . Cancer Mother   . Cancer Father    . Diabetes Sister     Review of Systems:  Constitutional: Denies fever, chills, diaphoresis, appetite change and fatigue.  HEENT: Denies photophobia, eye pain, redness, hearing loss, ear pain, congestion, sore throat, rhinorrhea, sneezing, mouth sores, trouble swallowing, neck pain, neck stiffness and tinnitus.   Respiratory: Denies cough, chest tightness,  and wheezing.   Cardiovascular: Denies chest pain, palpitations and leg swelling.  Gastrointestinal: Denies, abdominal pain, diarrhea, constipation, blood in stool and abdominal distention.  Genitourinary: Denies dysuria, urgency, frequency, hematuria, flank pain and difficulty urinating.  Endocrine: Denies: hot or cold intolerance, sweats, changes in hair or nails, polyuria, polydipsia. Musculoskeletal: Denies  myalgias, back pain, joint swelling, arthralgias and gait problem.  Skin: Denies pallor, rash and wound.  Neurological: Denies, seizures, syncope, weakness, light-headedness, numbness and headaches.  Hematological: Denies adenopathy. Easy bruising, personal or family bleeding history  Psychiatric/Behavioral: Denies suicidal ideation, mood changes, confusion, nervousness, sleep disturbance and agitation   Physical Exam: Blood pressure 159/58, pulse 83, temperature 98.9 F (37.2 C), temperature source Oral, resp. rate 19, height 5' 9"  (1.753 m), weight 86.546 kg (190 lb 12.8 oz), SpO2 100 %. Gen: AA Ox3 HEENT: [place]/AT/PERRL/EOMI Neck: supple, no bruits, no LAD, no goiter CV: RRR, no M/R/G Lungs: CTA B Abd: S/NT/ND/+BS Ext: 3-4++ edema bilaterally Neuro: non-focal  Labs on Admission:  Results for orders placed or performed during the hospital encounter of 03/29/15 (from the past 48 hour(s))  CBC with Differential     Status: Abnormal   Collection Time: 03/29/15 10:30 AM  Result Value Ref Range   WBC 4.1 4.0 - 10.5 K/uL   RBC 4.11 (L) 4.22 - 5.81 MIL/uL   Hemoglobin 12.0 (L) 13.0 - 17.0 g/dL   HCT 34.9 (L) 39.0 - 52.0 %     MCV 84.9 78.0 - 100.0 fL   MCH 29.2 26.0 - 34.0 pg   MCHC 34.4 30.0 - 36.0 g/dL   RDW 15.4 11.5 - 15.5 %   Platelets 177 150 - 400 K/uL   Neutrophils Relative % 73 %   Neutro Abs 3.0 1.7 - 7.7 K/uL   Lymphocytes Relative 13 %   Lymphs Abs 0.5 (L) 0.7 - 4.0 K/uL   Monocytes Relative 12 %   Monocytes Absolute 0.5 0.1 - 1.0 K/uL   Eosinophils Relative 1 %   Eosinophils Absolute 0.1 0.0 - 0.7 K/uL   Basophils Relative 1 %   Basophils Absolute 0.1 0.0 - 0.1 K/uL  Comprehensive metabolic panel     Status: Abnormal   Collection Time: 03/29/15 10:30 AM  Result Value Ref Range   Sodium 135 135 - 145 mmol/L   Potassium 3.4 (L) 3.5 - 5.1 mmol/L   Chloride 93 (L) 101 - 111 mmol/L   CO2 25 22 - 32 mmol/L   Glucose, Bld 171 (H) 65 - 99 mg/dL   BUN 128 (H) 6 - 20 mg/dL    Comment: RESULTS CONFIRMED BY MANUAL DILUTION   Creatinine, Ser 5.74 (H) 0.61 - 1.24 mg/dL   Calcium 8.6 (L) 8.9 - 10.3 mg/dL   Total Protein 6.4 (L) 6.5 - 8.1 g/dL   Albumin 3.4 (L) 3.5 - 5.0 g/dL   AST 23 15 - 41 U/L   ALT 23 17 - 63 U/L   Alkaline Phosphatase 84 38 - 126 U/L   Total Bilirubin 1.2 0.3 - 1.2 mg/dL   GFR calc non Af Amer 9 (L) >60 mL/min   GFR calc Af Amer 10 (L) >60 mL/min    Comment: (NOTE) The eGFR has been calculated using the CKD EPI equation. This calculation has not been validated in all clinical situations. eGFR's persistently <60 mL/min signify possible Chronic Kidney Disease.    Anion gap 17 (H) 5 - 15  Brain natriuretic peptide     Status: Abnormal   Collection Time: 03/29/15 10:30 AM  Result Value Ref Range   B Natriuretic Peptide >4500.0 (H) 0.0 - 100.0 pg/mL  Ammonia     Status: Abnormal   Collection Time: 03/29/15 10:30 AM  Result Value Ref Range   Ammonia 38 (H) 9 - 35 umol/L  Lipase, blood  Status: None   Collection Time: 03/29/15 10:30 AM  Result Value Ref Range   Lipase 37 11 - 51 U/L  I-stat troponin, ED     Status: Abnormal   Collection Time: 03/29/15 10:35 AM   Result Value Ref Range   Troponin i, poc 0.16 (HH) 0.00 - 0.08 ng/mL   Comment NOTIFIED PHYSICIAN    Comment 3            Comment: Due to the release kinetics of cTnI, a negative result within the first hours of the onset of symptoms does not rule out myocardial infarction with certainty. If myocardial infarction is still suspected, repeat the test at appropriate intervals.   Glucose, capillary     Status: Abnormal   Collection Time: 03/29/15  4:11 PM  Result Value Ref Range   Glucose-Capillary 206 (H) 65 - 99 mg/dL   Comment 1 Notify RN    Comment 2 Document in Chart   Urinalysis, Routine w reflex microscopic (not at [facility])     Status: Abnormal   Collection Time: 03/29/15  4:49 PM  Result Value Ref Range   Color, Urine YELLOW YELLOW   APPearance CLEAR CLEAR   Specific Gravity, Urine 1.010 1.005 - 1.030   pH 5.5 5.0 - 8.0   Glucose, UA NEGATIVE NEGATIVE mg/dL   Hgb urine dipstick NEGATIVE NEGATIVE   Bilirubin Urine NEGATIVE NEGATIVE   Ketones, ur NEGATIVE NEGATIVE mg/dL   Protein, ur 100 (A) NEGATIVE mg/dL   Nitrite NEGATIVE NEGATIVE   Leukocytes, UA NEGATIVE NEGATIVE  Urine microscopic-add on     Status: None   Collection Time: 03/29/15  4:49 PM  Result Value Ref Range   Squamous Epithelial / LPF NONE SEEN NONE SEEN   WBC, UA NONE SEEN 0 - 5 WBC/hpf   RBC / HPF NONE SEEN 0 - 5 RBC/hpf   Bacteria, UA NONE SEEN NONE SEEN    Radiological Exams on Admission: Dg Chest 2 View  03/29/2015  CLINICAL DATA:  Cough for 2 days. EXAM: CHEST  2 VIEW COMPARISON:  03/08/2015 FINDINGS: Left pacer/ defibrillator in place with the tip in the right ventricle. Cardiomegaly. Lungs are clear. No effusions or acute bony abnormality. IMPRESSION: Cardiomegaly.  No acute findings. Electronically Signed   By: Rolm Baptise M.D.   On: 03/29/2015 10:54    Assessment/Plan Principal Problem:   Fluid overload Active Problems:   Diabetes mellitus type 2 with retinopathy (HCC)   Essential  hypertension   CKD (chronic kidney disease) stage 5, GFR less than 15 ml/min (HCC)   ARF (acute renal failure) (HCC)   Acute on chronic combined systolic and diastolic CHF (congestive heart failure) (HCC)    Acute on CKD Stage V -Seen by renal. -Plan to place on high dose lasix to see if we can temporize him for f/u with his nephrologist as an OP for HD. -If symptoms persist (may indicate uremia) or if he fails to diurese will need placement of temp catheter and initiation of HD as an inpatient.  Elevated Troponin -Likely related to ESRD.  Acute on Chronic Combined CHF -Complicated by CKD Stage V. -Lasix, strict Is and Os, daily weights. -May need HD this admission to augment diuresis.  DVT Prophylaxis -Lovenox  Code Status -Full code    Time Spent on Admission: 80 minutes  [facility] Pager: [phone] 03/29/2015, 5:24 PM

## 2015-03-29 NOTE — ED Notes (Signed)
Pt states he has a cough and has been vomiting since last night.  Patient states he is to have a dialysis shunt place Thursday at [facility]

## 2015-03-29 NOTE — ED Notes (Signed)
Admitting MD at bedside.

## 2015-03-30 DIAGNOSIS — I10 Essential (primary) hypertension: Secondary | ICD-10-CM

## 2015-03-30 LAB — BASIC METABOLIC PANEL
ANION GAP: 13 (ref 5–15)
BUN: 122 mg/dL — ABNORMAL HIGH (ref 6–20)
CALCIUM: 8.7 mg/dL — AB (ref 8.9–10.3)
CHLORIDE: 95 mmol/L — AB (ref 101–111)
CO2: 27 mmol/L (ref 22–32)
Creatinine, Ser: 5.85 mg/dL — ABNORMAL HIGH (ref 0.61–1.24)
GFR calc non Af Amer: 9 mL/min — ABNORMAL LOW (ref >60)
GFR, EST AFRICAN AMERICAN: 10 mL/min — AB (ref >60)
GLUCOSE: 106 mg/dL — AB (ref 65–99)
POTASSIUM: 3.7 mmol/L (ref 3.5–5.1)
Sodium: 135 mmol/L (ref 135–145)

## 2015-03-30 LAB — CBC
HEMATOCRIT: 32.8 % — AB (ref 39.0–52.0)
HEMOGLOBIN: 11.2 g/dL — AB (ref 13.0–17.0)
MCH: 29.2 pg (ref 26.0–34.0)
MCHC: 34.1 g/dL (ref 30.0–36.0)
MCV: 85.6 fL (ref 78.0–100.0)
Platelets: 159 10*3/uL (ref 150–400)
RBC: 3.83 MIL/uL — AB (ref 4.22–5.81)
RDW: 15.5 % (ref 11.5–15.5)
WBC: 3.6 10*3/uL — ABNORMAL LOW (ref 4.0–10.5)

## 2015-03-30 LAB — GLUCOSE, CAPILLARY: GLUCOSE-CAPILLARY: 170 mg/dL — AB (ref 65–99)

## 2015-03-30 MED ORDER — TORSEMIDE 20 MG PO TABS
50.0000 mg | ORAL_TABLET | Freq: Every day | ORAL | Status: DC
  Administered 2015-03-30 – 2015-03-31 (×2): 50 mg via ORAL
  Filled 2015-03-30 (×2): qty 3

## 2015-03-30 NOTE — Progress Notes (Signed)
Inpatient Diabetes Program Recommendations  AACE/ADA: New Consensus Statement on Inpatient Glycemic Control (2015)  Target Ranges:  Prepandial:   less than 140 mg/dL      Peak postprandial:   less than 180 mg/dL (1-2 hours)      Critically ill patients:  140 - 180 mg/dL   Results for [name], [name] (MRN [id number]) as of 03/30/2015 09:47  Ref. Range 03/29/2015 16:11 03/29/2015 20:57  Glucose-Capillary Latest Ref Range: 65-99 mg/dL 206 (H) 254 (H)    Admit with: Fluid Overload  History: DM, CKD5, CHF  Home DM Meds: Lantus 30 units QAM  Current Insulin Orders: Lantus 30 units QHS     MD- Please consider starting Novolog Moderate Correction Scale/ SSI (0-15 units) TID AC + HS      --Will follow patient during hospitalization--  Wyn Quaker RN, MSN, CDE Diabetes Coordinator Inpatient Glycemic Control Team Team Pager: [phone] (8a-5p)

## 2015-03-30 NOTE — Progress Notes (Signed)
[facility] PROGRESS NOTE  [name] [id number] DOB: 15-Sep-1945 DOA: 03/29/2015 PCP: Odette Fraction, MD  Assessment/Plan: Acute on CKD Stage V -Nearing ESRD. -Renal has elected to DC IV lasix and transition to PO demadex. -Uremic symptoms have improved, altho Cr remains high. -Will need to monitor closely to make sure HD is not needed as an inpatient.  Elevated Troponin -Likely related to ESRD. -No acute ischemic abnormalities on EKG. -No CP. -No further cardiac work up anticipated.  Acute on Chronic Combined CHF -Complicated by CKD stage V. -Is 880 cc negative.  Code Status: Full Code Family Communication: Wife at bedside updated on plan of care and all questions answered.  Disposition Plan: To be determined   Consultants:  Renal   Antibiotics:  None   Subjective: Feels much better. No longer SOB or nausea.  Objective: Filed Vitals:   03/29/15 2100 03/29/15 2124 03/30/15 0601 03/30/15 1432  BP: 129/57  126/44 114/42  Pulse: 70 68 68 61  Temp: 98.1 F (36.7 C)  98.1 F (36.7 C) 97.3 F (36.3 C)  TempSrc: Oral  Oral Oral  Resp: 20 20 20 20   Height:   5\' 9"  (1.753 m)   Weight:   86.546 kg (190 lb 12.8 oz)   SpO2: 95% 95% 100% 100%    Intake/Output Summary (Last 24 hours) at 03/30/15 1533 Last data filed at 03/30/15 1432  Gross per 24 hour  Intake    603 ml  Output   1600 ml  Net   -997 ml   Filed Weights   03/29/15 1303 03/30/15 0601  Weight: 86.546 kg (190 lb 12.8 oz) 86.546 kg (190 lb 12.8 oz)    Exam:   General:  AA Ox3  Cardiovascular: RRR  Respiratory: mild bibasilar crackles  Abdomen: S/NT/ND/+BS  Extremities: 3++ edema bilaterally   Neurologic:  Grossly intact and non-focal  Data Reviewed: Basic Metabolic Panel:  Recent Labs Lab 03/29/15 1030 03/30/15 0607  NA 135 135  K 3.4* 3.7  CL 93* 95*  CO2 25 27  GLUCOSE 171* 106*  BUN 128* 122*  CREATININE 5.74* 5.85*  CALCIUM 8.6* 8.7*   Liver  Function Tests:  Recent Labs Lab 03/29/15 1030  AST 23  ALT 23  ALKPHOS 84  BILITOT 1.2  PROT 6.4*  ALBUMIN 3.4*    Recent Labs Lab 03/29/15 1030  LIPASE 37    Recent Labs Lab 03/29/15 1030  AMMONIA 38*   CBC:  Recent Labs Lab 03/29/15 1030 03/30/15 0607  WBC 4.1 3.6*  NEUTROABS 3.0  --   HGB 12.0* 11.2*  HCT 34.9* 32.8*  MCV 84.9 85.6  PLT 177 159   Cardiac Enzymes: No results for input(s): CKTOTAL, CKMB, CKMBINDEX, TROPONINI in the last 168 hours. BNP (last 3 results)  Recent Labs  03/05/15 1104 03/08/15 1946 03/29/15 1030  BNP >4500.0* >4500.0* >4500.0*    ProBNP (last 3 results) No results for input(s): PROBNP in the last 8760 hours.  CBG:  Recent Labs Lab 03/29/15 1611 03/29/15 2057  GLUCAP 206* 254*    No results found for this or any previous visit (from the past 240 hour(s)).   Studies: Dg Chest 2 View  03/29/2015  CLINICAL DATA:  Cough for 2 days. EXAM: CHEST  2 VIEW COMPARISON:  03/08/2015 FINDINGS: Left pacer/ defibrillator in place with the tip in the right ventricle. Cardiomegaly. Lungs are clear. No effusions or acute bony abnormality. IMPRESSION: Cardiomegaly.  No acute findings. Electronically Signed  By: Rolm Baptise M.D.   On: 03/29/2015 10:54    Scheduled Meds: . aspirin EC  81 mg Oral Daily  . calcitRIOL  0.25 mcg Oral Daily  . calcium acetate  667 mg Oral Q breakfast  . cloNIDine  0.3 mg Oral Daily  . enoxaparin (LOVENOX) injection  30 mg Subcutaneous Q24H  . hydrALAZINE  100 mg Oral BID  . insulin glargine  30 Units Subcutaneous Q2200  . metoprolol succinate  25 mg Oral Daily  . potassium chloride  30 mEq Oral BID  . sodium chloride flush  3 mL Intravenous Q12H  . torsemide  50 mg Oral Daily   Continuous Infusions:   Principal Problem:   Fluid overload Active Problems:   Diabetes mellitus type 2 with retinopathy (HCC)   Essential hypertension   CKD (chronic kidney disease) stage 5, GFR less than 15 ml/min  (HCC)   ARF (acute renal failure) (HCC)   Acute on chronic combined systolic and diastolic CHF (congestive heart failure) ([place])    Time spent: 25 minutes. Greater than 50% of this time was spent in direct contact with the patient coordinating care.    Lelon Frohlich  [facility] Pager [phone]  If 7PM-7AM, please contact night-coverage at [email or web address], password [facility] 03/30/2015, 3:33 PM  LOS: 1 day

## 2015-03-30 NOTE — Progress Notes (Signed)
Subjective: Patient denies any nausea or vomiting. He denies also any difficulty breathing. Overall he is feeling much better.   Objective: Vital signs in last 24 hours: Temp:  [98.1 F (36.7 C)-98.9 F (37.2 C)] 98.1 F (36.7 C) (03/13 0601) Pulse Rate:  [68-90] 68 (03/13 0601) Resp:  [14-21] 20 (03/13 0601) BP: (126-170)/(44-68) 126/44 mmHg (03/13 0601) SpO2:  [84 %-100 %] 100 % (03/13 0601) Weight:  [190 lb 12.8 oz (86.546 kg)] 190 lb 12.8 oz (86.546 kg) (03/13 0601)  Intake/Output from previous day: 03/12 0701 - 03/13 0700 In: 120 [P.O.:120] Out: 1000 [Urine:1000] Intake/Output this shift: Total I/O In: 3 [I.V.:3] Out: 300 [Urine:300]   Recent Labs  03/29/15 1030 03/30/15 0607  HGB 12.0* 11.2*    Recent Labs  03/29/15 1030 03/30/15 0607  WBC 4.1 3.6*  RBC 4.11* 3.83*  HCT 34.9* 32.8*  PLT 177 159    Recent Labs  03/29/15 1030 03/30/15 0607  NA 135 135  K 3.4* 3.7  CL 93* 95*  CO2 25 27  BUN 128* 122*  CREATININE 5.74* 5.85*  GLUCOSE 171* 106*  CALCIUM 8.6* 8.7*   No results for input(s): LABPT, INR in the last 72 hours.  Generally patient is alert and in no distress Chest is clear to auscultation His heart exam regular rate and rhythm no murmur no S3 Extremities no edema  Assessment/Plan: Problem #1 chronic renal failure: Stage V. Presently his BUN and creatinine remains high. Patient however doesn't seem to have any uremic signs and symptoms. Problem #2 hypokalemia: His potassium has corrected Problem #3 hypertension: His blood pressure is reasonably controlled Problem #4 diabetes: Patient denies any poorlys or polyuria. His random blood sugar is 106. Problem #5 anemia: His hemoglobin remains within target goal Problem #6 metabolic bone disease: His calcium in the range and patient is on phosphorus binder. Problem #7 sleep apnea Plan: Will DC IV Lasix 2] will start on Demadex 80 mg by mouth daily 3] patient has this moment doesn't require  dialysis but need access placement. If he becomes symptomatic we'll make arrangements for him to get dialysis as an inpatient. 4] we'll check his renal panel in the morning.   Zameria Vogl S 03/30/2015, 8:55 AM

## 2015-03-31 DIAGNOSIS — I5043 Acute on chronic combined systolic (congestive) and diastolic (congestive) heart failure: Secondary | ICD-10-CM

## 2015-03-31 DIAGNOSIS — E11311 Type 2 diabetes mellitus with unspecified diabetic retinopathy with macular edema: Secondary | ICD-10-CM

## 2015-03-31 LAB — RENAL FUNCTION PANEL
ANION GAP: 15 (ref 5–15)
Albumin: 3.2 g/dL — ABNORMAL LOW (ref 3.5–5.0)
BUN: 132 mg/dL — ABNORMAL HIGH (ref 6–20)
CALCIUM: 8.8 mg/dL — AB (ref 8.9–10.3)
CO2: 26 mmol/L (ref 22–32)
CREATININE: 5.91 mg/dL — AB (ref 0.61–1.24)
Chloride: 95 mmol/L — ABNORMAL LOW (ref 101–111)
GFR calc Af Amer: 10 mL/min — ABNORMAL LOW (ref >60)
GFR calc non Af Amer: 9 mL/min — ABNORMAL LOW (ref >60)
GLUCOSE: 126 mg/dL — AB (ref 65–99)
Phosphorus: 5.5 mg/dL — ABNORMAL HIGH (ref 2.5–4.6)
Potassium: 3.6 mmol/L (ref 3.5–5.1)
SODIUM: 136 mmol/L (ref 135–145)

## 2015-03-31 MED ORDER — TORSEMIDE 20 MG PO TABS: 80.0000 mg | ORAL_TABLET | Freq: Every day | ORAL | Status: DC

## 2015-03-31 MED ORDER — METOLAZONE 5 MG PO TABS
2.5000 mg | ORAL_TABLET | Freq: Two times a day (BID) | ORAL | Status: DC
  Administered 2015-03-31: 2.5 mg via ORAL
  Filled 2015-03-31: qty 1

## 2015-03-31 NOTE — Care Management Important Message (Signed)
Important Message  Patient Details  Name: [name] MRN: [id number] Date of Birth: Sep 08, 1945   Medicare Important Message Given:  Yes    Sherald Barge, RN 03/31/2015, 1:36 PM

## 2015-03-31 NOTE — Progress Notes (Signed)
Subjective: Patient presently remains asymptomatic. He denies any nausea or vomiting. His appetite is good and no difficulty breathing.   Objective: Vital signs in last 24 hours: Temp:  [97.3 F (36.3 C)-98.8 F (37.1 C)] 98.8 F (37.1 C) (03/14 0524) Pulse Rate:  [61-76] 76 (03/14 0524) Resp:  [20] 20 (03/14 0524) BP: (114-150)/(42-49) 150/49 mmHg (03/14 0524) SpO2:  [94 %-100 %] 98 % (03/14 0524) Weight:  [191 lb 9.6 oz (86.909 kg)] 191 lb 9.6 oz (86.909 kg) (03/14 0524)  Intake/Output from previous day: 03/13 0701 - 03/14 0700 In: 843 [P.O.:840; I.V.:3] Out: 1500 [Urine:1500] Intake/Output this shift:     Recent Labs  03/29/15 1030 03/30/15 0607  HGB 12.0* 11.2*    Recent Labs  03/29/15 1030 03/30/15 0607  WBC 4.1 3.6*  RBC 4.11* 3.83*  HCT 34.9* 32.8*  PLT 177 159    Recent Labs  03/30/15 0607 03/31/15 0503  NA 135 136  K 3.7 3.6  CL 95* 95*  CO2 27 26  BUN 122* 132*  CREATININE 5.85* 5.91*  GLUCOSE 106* 126*  CALCIUM 8.7* 8.8*   No results for input(s): LABPT, INR in the last 72 hours.  Generally patient is alert and in no distress Chest is clear to auscultation His heart exam regular rate and rhythm no murmur no S3 Extremities 1+  edema  Assessment/Plan: Problem #1 chronic renal failure: Stage V. Presently his BUN and creatinine remains high. No sign of improvement. Patient however does not have any uremic signs and symptoms. Problem #2 hypokalemia: His potassium has corrected Problem #3 hypertension: His blood pressure is reasonably controlled Problem #4 diabetes: Patient denies any poorlys or polyuria. His random blood sugar is 106. Problem #5 anemia: His hemoglobin remains within target goal Problem #6 metabolic bone disease: His calcium in the range and his phosphorus is 5.5 slightly higher than our target goal. Presently patient is on PhosLo.. Problem #7 sleep apnea Problem #8 fluid management. Patient asymptomatic. He has still some  edema. Patient had about 1500 mL of urine output. Plan: We'll continue with Demadex. 2] patient advised to decrease his salt and fluid intake 3] patient doesn't dialysis for now but because of worsening of his renal failure and edema patient need to consider dialysis very soon. Since he has an appointment on Thursday for access placement is reasonable to send patient home so that he will keep his appointment. 4] we'll admit her and is on 2.5 mg by mouth twice a day. 5] as stated above is okay to send patient home and follow his regular nephrology group    [facility] S 03/31/2015, 8:17 AM

## 2015-03-31 NOTE — Discharge Summary (Signed)
Physician Discharge Summary  [name] [id number] DOB: 1945-03-22 DOA: 03/29/2015  PCP: Odette Fraction, MD  Admit date: 03/29/2015 Discharge date: 03/31/2015  Time spent: 45 minutes  Recommendations for Outpatient Follow-up:  -Will be discharged home today. -Has appointment in 2 days with vascular for permanent and temporary HD access. -Advised to secure follow up with renal this week in [place].   Discharge Diagnoses:  Principal Problem:   Fluid overload Active Problems:   Diabetes mellitus type 2 with retinopathy (HCC)   Essential hypertension   CKD (chronic kidney disease) stage 5, GFR less than 15 ml/min (HCC)   ARF (acute renal failure) (HCC)   Acute on chronic combined systolic and diastolic CHF (congestive heart failure) ([place])   Discharge Condition: Stable and improved  Filed Weights   03/29/15 1303 03/30/15 0601 03/31/15 0524  Weight: 86.546 kg (190 lb 12.8 oz) 86.546 kg (190 lb 12.8 oz) 86.909 kg (191 lb 9.6 oz)    History of present illness:  70 y/o with CKD Stage V nearing HD, CAD s/p ICD here with n/v that began last night. Also with increasing LE edema and SOB and some orthopnea. Found to have a BUN of 128, Cr 5.74, BNP >4500. We have been asked to admit him for further evaluation and management.  Hospital Course:   Acute on CKD Stage V -Nearing ESRD. -Renal has elected to DC IV lasix and transition to PO demadex at home dose,. -Uremic symptoms have improved, altho Cr remains high. -Patient is anxious to be discharged home and wants to resume care with Dr. Mercy Moore in [place]. -He has appointment on Thursday (in 2 days) for placement of HD access.  Elevated Troponin -Likely related to ESRD. -No acute ischemic abnormalities on EKG. -No CP. -No further cardiac work up anticipated.  Acute on Chronic Combined CHF -Complicated by CKD stage V. -Is 1.5 L negative this admission.   Procedures:  None    Consultations:  Nephrology  Discharge Instructions  Discharge Instructions    Diet - low sodium heart healthy    Complete by:  As directed      Increase activity slowly    Complete by:  As directed             Medication List    STOP taking these medications        potassium chloride SA 20 MEQ tablet  Commonly known as:  K-DUR,KLOR-CON     sorbitol 70 % solution      TAKE these medications        albuterol 108 (90 Base) MCG/ACT inhaler  Commonly known as:  PROVENTIL HFA;VENTOLIN HFA  Inhale 2 puffs into the lungs every 6 (six) hours as needed for wheezing or shortness of breath.     albuterol (2.5 MG/3ML) 0.083% nebulizer solution  Commonly known as:  PROVENTIL  Take 3 mLs (2.5 mg total) by nebulization every 6 (six) hours as needed for wheezing.     aspirin EC 81 MG tablet  Take 81 mg by mouth daily.     calcitRIOL 0.25 MCG capsule  Commonly known as:  ROCALTROL  Take 1 capsule (0.25 mcg total) by mouth daily.     calcium acetate 667 MG capsule  Commonly known as:  PHOSLO  Take 667 mg by mouth daily. Take with the biggest meal.     cloNIDine 0.3 MG tablet  Commonly known as:  CATAPRES  Take 1 tablet (0.3 mg total) by mouth daily.  colchicine 0.6 MG tablet  Take 0.6 mg by mouth as needed (gout flare up).     hydrALAZINE 100 MG tablet  Commonly known as:  APRESOLINE  Take 1 tablet (100 mg total) by mouth 2 (two) times daily.     Insulin Glargine 100 UNIT/ML Solostar Pen  Commonly known as:  LANTUS SOLOSTAR  Inject 30 Units into the skin daily at 10 pm.     metolazone 5 MG tablet  Commonly known as:  ZAROXOLYN  Take 1 tablet (5 mg total) by mouth daily.     metoprolol succinate 25 MG 24 hr tablet  Commonly known as:  TOPROL-XL  Take 1 tablet (25 mg total) by mouth daily.     torsemide 100 MG tablet  Commonly known as:  DEMADEX  Take 1.5 tablets (150 mg total) by mouth 2 (two) times daily.     TYLENOL PM EXTRA STRENGTH 25-500 MG Tabs tablet   Generic drug:  diphenhydramine-acetaminophen  Take 1 tablet by mouth at bedtime as needed (pain/sleep).       Allergies  Allergen Reactions  . Ace Inhibitors Cough       Follow-up Information    Follow up with Seffner, MD. Schedule an appointment as soon as possible for a visit in 2 weeks.   Specialty:  Family Medicine   Contact information:   [address] [place] [address] [phone]        The results of significant diagnostics from this hospitalization (including imaging, microbiology, ancillary and laboratory) are listed below for reference.    Significant Diagnostic Studies: Dg Chest 2 View  03/29/2015  CLINICAL DATA:  Cough for 2 days. EXAM: CHEST  2 VIEW COMPARISON:  03/08/2015 FINDINGS: Left pacer/ defibrillator in place with the tip in the right ventricle. Cardiomegaly. Lungs are clear. No effusions or acute bony abnormality. IMPRESSION: Cardiomegaly.  No acute findings. Electronically Signed   By: Rolm Baptise M.D.   On: 03/29/2015 10:54   Dg Chest 2 View  03/08/2015  CLINICAL DATA:  Shortness of breath with bilateral lower extremity swelling. Worse recently. EXAM: CHEST  2 VIEW COMPARISON:  03/05/2015 and 02/26/2015 FINDINGS: Left-sided pacemaker unchanged. Lungs are adequately inflated with small amount right pleural fluid. Likely associated atelectasis in the right base. Mild prominence of the perihilar markings suggesting mild vascular congestion. Mild stable cardiomegaly. Remainder of the exam is unchanged. IMPRESSION: Mild stable cardiomegaly with findings suggesting mild vascular congestion. Small amount right pleural fluid. Likely associated minimal right basilar atelectasis. Electronically Signed   By: Marin Olp M.D.   On: 03/08/2015 18:48   Dg Chest Port 1 View  03/05/2015  CLINICAL DATA:  Shortness of breath for 7 days. EXAM: PORTABLE CHEST 1 VIEW COMPARISON:  02/26/2015 FINDINGS: Single lead AICD noted. Mild enlargement of the  cardiopericardial silhouette with atherosclerotic aortic arch and aortic tortuosity. Upper zone pulmonary vascular prominence. Indistinct interstitial accentuation at the right lung base with indistinctness of the right costophrenic angle laterally. IMPRESSION: 1. Mild enlargement of the cardiopericardial silhouette with pulmonary venous hypertension. 2. Indistinct opacity peripherally at the right lung base with possible blunting of the right lateral costophrenic angle. Difficult to exclude bronchopneumonia at the right lung base although this could alternatively represent some confluent interstitial edema and trace right pleural effusion. 3. Atherosclerotic aortic arch. Electronically Signed   By: Van Clines M.D.   On: 03/05/2015 11:09    Microbiology: No results found for this or any previous visit (from  the past 240 hour(s)).   Labs: Basic Metabolic Panel:  Recent Labs Lab 03/29/15 1030 03/30/15 0607 03/31/15 0503  NA 135 135 136  K 3.4* 3.7 3.6  CL 93* 95* 95*  CO2 25 27 26   GLUCOSE 171* 106* 126*  BUN 128* 122* 132*  CREATININE 5.74* 5.85* 5.91*  CALCIUM 8.6* 8.7* 8.8*  PHOS  --   --  5.5*   Liver Function Tests:  Recent Labs Lab 03/29/15 1030 03/31/15 0503  AST 23  --   ALT 23  --   ALKPHOS 84  --   BILITOT 1.2  --   PROT 6.4*  --   ALBUMIN 3.4* 3.2*    Recent Labs Lab 03/29/15 1030  LIPASE 37    Recent Labs Lab 03/29/15 1030  AMMONIA 38*   CBC:  Recent Labs Lab 03/29/15 1030 03/30/15 0607  WBC 4.1 3.6*  NEUTROABS 3.0  --   HGB 12.0* 11.2*  HCT 34.9* 32.8*  MCV 84.9 85.6  PLT 177 159   Cardiac Enzymes: No results for input(s): CKTOTAL, CKMB, CKMBINDEX, TROPONINI in the last 168 hours. BNP: BNP (last 3 results)  Recent Labs  03/05/15 1104 03/08/15 1946 03/29/15 1030  BNP >4500.0* >4500.0* >4500.0*    ProBNP (last 3 results) No results for input(s): PROBNP in the last 8760 hours.  CBG:  Recent Labs Lab 03/29/15 1611  03/29/15 2057 03/30/15 1833  GLUCAP 206* 254* 170*       Signed:  HERNANDEZ ACOSTA,ESTELA  [facility] Pager: [phone] 03/31/2015, 2:03 PM

## 2015-03-31 NOTE — Consult Note (Signed)
   [facility] CM Inpatient Consult   03/31/2015  [name] May 17, 1945 [id number]  Attempted to speak with patient regarding [facility] program services. Patient discharged before able to speak with him. Royetta Crochet. Laymond Purser, RN, BSN, [place] [facility] Liaison [phone]

## 2015-03-31 NOTE — Care Management Note (Signed)
Case Management Note  Patient Details  Name: [name] MRN: [id number] Date of Birth: 19-Nov-1945  Subjective/Objective:                  Pt is from home and ind with ADL's. Pt plans to return home with self care.   Action/Plan: No CM needs.   Expected Discharge Date:  03/31/15               Expected Discharge Plan:  Home/Self Care  In-House Referral:  NA  Discharge planning Services  CM Consult  Post Acute Care Choice:  NA Choice offered to:  NA  DME Arranged:    DME Agency:     HH Arranged:    HH Agency:     Status of Service:  Completed, signed off  Medicare Important Message Given:  Yes Date Medicare IM Given:    Medicare IM give by:    Date Additional Medicare IM Given:    Additional Medicare Important Message give by:     If discussed at [place] of Stay Meetings, dates discussed:    Additional Comments:  Sherald Barge, RN 03/31/2015, 1:39 PM

## 2015-03-31 NOTE — Progress Notes (Signed)
Patient alert and oriented, independent, VSS, pt. Tolerating diet well. No complaints of pain or nausea. Pt. Had IV removed tip intact. Pt. Had prescriptions given. Pt. Voiced understanding of discharge instructions with no further questions. Pt. Discharged via wheelchair with auxilliary.  

## 2015-04-01 ENCOUNTER — Other Ambulatory Visit: Payer: Self-pay | Admitting: *Deleted

## 2015-04-01 ENCOUNTER — Encounter (HOSPITAL_COMMUNITY): Payer: Self-pay | Admitting: *Deleted

## 2015-04-01 MED ORDER — DEXTROSE 5 % IV SOLN
1.5000 g | INTRAVENOUS | Status: AC
  Administered 2015-04-02: 1.5 g via INTRAVENOUS
  Filled 2015-04-01: qty 1.5

## 2015-04-01 MED ORDER — SODIUM CHLORIDE 0.9 % IV SOLN
INTRAVENOUS | Status: DC
  Administered 2015-04-02: 07:00:00 via INTRAVENOUS

## 2015-04-01 NOTE — Progress Notes (Signed)
Pt has a pacemaker and cardiomyopathy. Followed by Dr. Cristopher Peru. Spoke with pt's wife for pre-op call (pt was taking a nap), she states pt has not c/o any recent chest pain or sob. Pt is diabetic, last A1C was 5.9 on 03/09/15. Fasting blood sugar in the hospital this week ran between 87-low 100's. Wife states that is normal for at home also. [facility] office staff instructed pt not to take his Lantus insulin the morning of surgery. I did instruct pt's wife to have pt check his blood sugar in the AM and if his blood sugar is 70 or below to treat it with a 1/2 cup (4 oz) of clear (apple or cranberry) juice. Then instructed her to have pt recheck his blood sugar 15 minutes after drinking juice. If blood sugar is still 70 or below, instructed her to call Short Stay (number given to her) and ask to speak with a nurse. She voiced understanding. These instructions given per Diabetes Medication Adjustment Guidelines Prior to Procedure and Surgery.

## 2015-04-01 NOTE — Progress Notes (Signed)
Anesthesia Chart Review:  Pt is a 70 year old male scheduled for insertion of dialysis catheter, R radiocephalic AV fistula creation on 04/02/2015 with Dr. Bridgett Larsson.   Pt is a same day work up.   Cardiologist is Dr. Lovena Le who is aware of upcoming procedure.   PMH includes:  Nonischemic cardiomyopathy, AICD ([employer], implanted 12/03/14), chronic combined systolic and diastolic HF, HTN, CKD not yet on HD, mild aortic stenosis, hyperlipidemia, OSA, hyperparathyroidism. Current smoker. BMI 28. S/p ERCP 01/09/15. S/p cholecystectomy 01/07/15.   Pt hospitalized 3/12-3/14/17 for fluid overload, CKD (stage V), acute renal failure.   Medications include: albuterol, ASA, clonidine, hydralazine, lantus, metolazone, metoprolol, demadex.   Labs 03/30/15 reviewed. Renal function consistent with advanced CKD.   Chest x-ray 03/29/15 reviewed. Cardiolmegaly. No acute findings.   EKG 03/29/15: Sinus rhythm. PVCs. Probable left atrial enlargement. LVH with secondary repolarization abnormality. Probable anterior infarct, age indeterminate. Baseline wander in lead(s) II III aVF.  Echo 03/09/15:  - Left ventricle: The cavity size was moderately dilated. Wall thickness was increased in a pattern of moderate LVH. Systolic function was severely reduced. The estimated ejection fraction was approximately 20%. Severe global hypokinesis. Features are consistent with a pseudonormal left ventricular filling pattern, with concomitant abnormal relaxation and increased filling pressure (grade 2 diastolic dysfunction). Doppler parameters are consistent with high ventricular filling pressure. - Aortic valve: Mildly to moderately calcified annulus. Trileaflet. There was mild stenosis. There was moderate regurgitation. Valve area (VTI): 1.7 cm^2. Valve area (Vmax): 1.6 cm^2. Valve area (Vmean): 1.55 cm^2. - Mitral valve: Calcified annulus. Moderately thickened, moderately calcified leaflets . Restricted leaflet mobility due to left   ventricular dysfunction.There was mild to moderate regurgitation. - Left atrium: The atrium was severely dilated. - Right ventricle: Pacer wire or catheter noted in right ventricle. Systolic function was moderately reduced. - Right atrium: The atrium was mildly dilated. Pacer wire or catheter noted in right atrium. Central venous pressure (est): 39mm Hg. - Atrial septum: There was a patent foramen ovale. - Tricuspid valve: There was mild-moderate regurgitation. - Pulmonary arteries: PA peak pressure: 49 mm Hg (S). - Inferior vena cava: The vessel was dilated. The respirophasic diameter changes were blunted (< 50%), consistent with elevated central venous pressure.  Cardiac cath 03/19/14:  1. Widely patent coronary arteries with minimal irregularity noted 2. Congestive heart failure secondary to nonischemic cardiomyopathy 3. Hemodynamic findings demonstrating preserved cardiac output but elevated left and right heart filling pressures  Nuclear stress test 03/04/14:  1. Moderate degree of inferior wall scar. However, overlying soft tissue attenuation cannot entirely be ruled out. 2. Severe global hypokinesis with the inferior wall more akinetic. 3. Left ventricular ejection fraction 20% 4. High-risk stress test findings primarily based on severe left ventricular dysfunction.  If no changes, I anticipate pt can proceed with surgery as scheduled.   Willeen Cass, FNP-BC [facility] Short Stay Surgical Center/Anesthesiology Phone: [phone] 04/01/2015 3:24 PM

## 2015-04-01 NOTE — Patient Outreach (Signed)
Call to patient to discuss [facility] program services. Patient agrees to participate. Will place order for Transition of Care program. [name]. Laymond Purser, RN, BSN, [place] [facility] Liaison [phone]

## 2015-04-01 NOTE — Patient Outreach (Signed)
Call to patient home regarding [facility] services. Spoke with wife Mariann Laster, who reports patient is resting, she requests call back later today. Plan to call again later. Royetta Crochet. Laymond Purser, RN, BSN, [place] [facility] Liaison [phone]

## 2015-04-02 ENCOUNTER — Encounter (HOSPITAL_COMMUNITY): Payer: Self-pay | Admitting: *Deleted

## 2015-04-02 ENCOUNTER — Ambulatory Visit (HOSPITAL_COMMUNITY): Payer: Medicare Other

## 2015-04-02 ENCOUNTER — Ambulatory Visit (HOSPITAL_COMMUNITY): Payer: Medicare Other | Admitting: Emergency Medicine

## 2015-04-02 ENCOUNTER — Telehealth: Payer: Self-pay | Admitting: Vascular Surgery

## 2015-04-02 ENCOUNTER — Encounter (HOSPITAL_COMMUNITY)
Admission: RE | Disposition: A | Discharge status: Home / Self Care | Payer: Self-pay | Source: Ambulatory Visit | Attending: Vascular Surgery

## 2015-04-02 ENCOUNTER — Ambulatory Visit (HOSPITAL_COMMUNITY)
Admission: RE | Admit: 2015-04-02 | Discharge: 2015-04-02 | Disposition: A | Discharge status: Home / Self Care | Payer: Medicare Other | Source: Ambulatory Visit | Attending: Vascular Surgery | Admitting: Vascular Surgery

## 2015-04-02 DIAGNOSIS — N186 End stage renal disease: Secondary | ICD-10-CM | POA: Insufficient documentation

## 2015-04-02 DIAGNOSIS — E213 Hyperparathyroidism, unspecified: Secondary | ICD-10-CM | POA: Insufficient documentation

## 2015-04-02 DIAGNOSIS — I2510 Atherosclerotic heart disease of native coronary artery without angina pectoris: Secondary | ICD-10-CM | POA: Diagnosis not present

## 2015-04-02 DIAGNOSIS — I132 Hypertensive heart and chronic kidney disease with heart failure and with stage 5 chronic kidney disease, or end stage renal disease: Secondary | ICD-10-CM | POA: Diagnosis not present

## 2015-04-02 DIAGNOSIS — N185 Chronic kidney disease, stage 5: Secondary | ICD-10-CM | POA: Diagnosis not present

## 2015-04-02 DIAGNOSIS — F17210 Nicotine dependence, cigarettes, uncomplicated: Secondary | ICD-10-CM | POA: Insufficient documentation

## 2015-04-02 DIAGNOSIS — I5042 Chronic combined systolic (congestive) and diastolic (congestive) heart failure: Secondary | ICD-10-CM | POA: Diagnosis not present

## 2015-04-02 DIAGNOSIS — E1122 Type 2 diabetes mellitus with diabetic chronic kidney disease: Secondary | ICD-10-CM | POA: Diagnosis not present

## 2015-04-02 DIAGNOSIS — Z992 Dependence on renal dialysis: Secondary | ICD-10-CM | POA: Diagnosis not present

## 2015-04-02 DIAGNOSIS — I428 Other cardiomyopathies: Secondary | ICD-10-CM | POA: Insufficient documentation

## 2015-04-02 DIAGNOSIS — Z452 Encounter for adjustment and management of vascular access device: Secondary | ICD-10-CM | POA: Diagnosis not present

## 2015-04-02 DIAGNOSIS — Z79899 Other long term (current) drug therapy: Secondary | ICD-10-CM | POA: Diagnosis not present

## 2015-04-02 DIAGNOSIS — Z6828 Body mass index (BMI) 28.0-28.9, adult: Secondary | ICD-10-CM | POA: Diagnosis not present

## 2015-04-02 DIAGNOSIS — Z7982 Long term (current) use of aspirin: Secondary | ICD-10-CM | POA: Diagnosis not present

## 2015-04-02 DIAGNOSIS — E11319 Type 2 diabetes mellitus with unspecified diabetic retinopathy without macular edema: Secondary | ICD-10-CM | POA: Diagnosis not present

## 2015-04-02 DIAGNOSIS — E785 Hyperlipidemia, unspecified: Secondary | ICD-10-CM | POA: Diagnosis not present

## 2015-04-02 DIAGNOSIS — M109 Gout, unspecified: Secondary | ICD-10-CM | POA: Insufficient documentation

## 2015-04-02 DIAGNOSIS — E669 Obesity, unspecified: Secondary | ICD-10-CM | POA: Insufficient documentation

## 2015-04-02 DIAGNOSIS — M1990 Unspecified osteoarthritis, unspecified site: Secondary | ICD-10-CM | POA: Diagnosis not present

## 2015-04-02 DIAGNOSIS — Z95810 Presence of automatic (implantable) cardiac defibrillator: Secondary | ICD-10-CM | POA: Insufficient documentation

## 2015-04-02 DIAGNOSIS — Z794 Long term (current) use of insulin: Secondary | ICD-10-CM | POA: Diagnosis not present

## 2015-04-02 DIAGNOSIS — G4730 Sleep apnea, unspecified: Secondary | ICD-10-CM | POA: Diagnosis not present

## 2015-04-02 DIAGNOSIS — Z9889 Other specified postprocedural states: Secondary | ICD-10-CM

## 2015-04-02 DIAGNOSIS — I509 Heart failure, unspecified: Secondary | ICD-10-CM | POA: Diagnosis not present

## 2015-04-02 HISTORY — DX: Constipation, unspecified: K59.00

## 2015-04-02 HISTORY — PX: INSERTION OF DIALYSIS CATHETER: SHX1324

## 2015-04-02 HISTORY — DX: Reserved for inherently not codable concepts without codable children: IMO0001

## 2015-04-02 HISTORY — PX: AV FISTULA PLACEMENT: SHX1204

## 2015-04-02 HISTORY — DX: Unspecified osteoarthritis, unspecified site: M19.90

## 2015-04-02 LAB — POCT I-STAT 4, (NA,K, GLUC, HGB,HCT)
Glucose, Bld: 77 mg/dL (ref 65–99)
HEMATOCRIT: 37 % — AB (ref 39.0–52.0)
HEMOGLOBIN: 12.6 g/dL — AB (ref 13.0–17.0)
POTASSIUM: 3.2 mmol/L — AB (ref 3.5–5.1)
SODIUM: 138 mmol/L (ref 135–145)

## 2015-04-02 LAB — GLUCOSE, CAPILLARY
GLUCOSE-CAPILLARY: 44 mg/dL — AB (ref 65–99)
GLUCOSE-CAPILLARY: 93 mg/dL (ref 65–99)
Glucose-Capillary: 78 mg/dL (ref 65–99)
Glucose-Capillary: 82 mg/dL (ref 65–99)

## 2015-04-02 SURGERY — INSERTION OF DIALYSIS CATHETER
Anesthesia: Monitor Anesthesia Care | Site: Chest | Laterality: Right

## 2015-04-02 MED ORDER — OXYCODONE-ACETAMINOPHEN 5-325 MG PO TABS: 1.0000 | ORAL_TABLET | Freq: Four times a day (QID) | ORAL | Status: DC | PRN

## 2015-04-02 MED ORDER — OXYCODONE HCL 5 MG PO TABS
5.0000 mg | ORAL_TABLET | Freq: Once | ORAL | Status: AC | PRN
  Administered 2015-04-02: 5 mg via ORAL

## 2015-04-02 MED ORDER — HEPARIN SODIUM (PORCINE) 1000 UNIT/ML IJ SOLN
INTRAMUSCULAR | Status: AC
  Filled 2015-04-02: qty 1

## 2015-04-02 MED ORDER — DEXTROSE 50 % IV SOLN
25.0000 mL | Freq: Once | INTRAVENOUS | Status: AC
  Administered 2015-04-02: 25 mL via INTRAVENOUS

## 2015-04-02 MED ORDER — DEXTROSE 50 % IV SOLN
INTRAVENOUS | Status: AC
  Administered 2015-04-02: 25 mL via INTRAVENOUS
  Filled 2015-04-02: qty 50

## 2015-04-02 MED ORDER — LIDOCAINE HCL (PF) 1 % IJ SOLN
INTRAMUSCULAR | Status: AC
  Filled 2015-04-02: qty 30

## 2015-04-02 MED ORDER — DIPHENHYDRAMINE HCL 50 MG/ML IJ SOLN
INTRAMUSCULAR | Status: DC | PRN
  Administered 2015-04-02: 25 mg via INTRAVENOUS

## 2015-04-02 MED ORDER — LIDOCAINE HCL (CARDIAC) 20 MG/ML IV SOLN
INTRAVENOUS | Status: DC | PRN
  Administered 2015-04-02: 40 mg via INTRAVENOUS

## 2015-04-02 MED ORDER — PROPOFOL 500 MG/50ML IV EMUL
INTRAVENOUS | Status: DC | PRN
  Administered 2015-04-02: 50 ug/kg/min via INTRAVENOUS

## 2015-04-02 MED ORDER — 0.9 % SODIUM CHLORIDE (POUR BTL) OPTIME
TOPICAL | Status: DC | PRN
  Administered 2015-04-02: 1000 mL

## 2015-04-02 MED ORDER — HEPARIN SODIUM (PORCINE) 1000 UNIT/ML IJ SOLN
INTRAMUSCULAR | Status: DC | PRN
  Administered 2015-04-02: 1000 [IU]

## 2015-04-02 MED ORDER — FENTANYL CITRATE (PF) 250 MCG/5ML IJ SOLN
INTRAMUSCULAR | Status: AC
  Filled 2015-04-02: qty 5

## 2015-04-02 MED ORDER — PROPOFOL 10 MG/ML IV BOLUS
INTRAVENOUS | Status: DC | PRN
Start: 2015-04-02 — End: 2015-04-02
  Administered 2015-04-02 (×2): 10 mg via INTRAVENOUS

## 2015-04-02 MED ORDER — ONDANSETRON HCL 4 MG/2ML IJ SOLN: 4.0000 mg | Freq: Once | INTRAMUSCULAR | Status: DC | PRN

## 2015-04-02 MED ORDER — OXYCODONE HCL 5 MG/5ML PO SOLN: 5.0000 mg | Freq: Once | ORAL | Status: AC | PRN

## 2015-04-02 MED ORDER — OXYCODONE HCL 5 MG PO TABS
ORAL_TABLET | ORAL | Status: AC
  Filled 2015-04-02: qty 1

## 2015-04-02 MED ORDER — CHLORHEXIDINE GLUCONATE CLOTH 2 % EX PADS
6.0000 | MEDICATED_PAD | Freq: Once | CUTANEOUS | Status: DC
Start: 2015-04-02 — End: 2015-04-02

## 2015-04-02 MED ORDER — PROPOFOL 10 MG/ML IV BOLUS
INTRAVENOUS | Status: AC
  Filled 2015-04-02: qty 20

## 2015-04-02 MED ORDER — FENTANYL CITRATE (PF) 100 MCG/2ML IJ SOLN: 25.0000 ug | INTRAMUSCULAR | Status: DC | PRN

## 2015-04-02 MED ORDER — CHLORHEXIDINE GLUCONATE CLOTH 2 % EX PADS: 6.0000 | MEDICATED_PAD | Freq: Once | CUTANEOUS | Status: DC

## 2015-04-02 MED ORDER — HEPARIN SODIUM (PORCINE) 5000 UNIT/ML IJ SOLN
INTRAMUSCULAR | Status: DC | PRN
  Administered 2015-04-02: 09:00:00

## 2015-04-02 MED ORDER — DIPHENHYDRAMINE HCL 50 MG/ML IJ SOLN
INTRAMUSCULAR | Status: AC
  Filled 2015-04-02: qty 1

## 2015-04-02 MED ORDER — LIDOCAINE HCL (PF) 1 % IJ SOLN
INTRAMUSCULAR | Status: DC | PRN
  Administered 2015-04-02: 22 mL

## 2015-04-02 MED ORDER — SODIUM CHLORIDE 0.9 % IV SOLN: INTRAVENOUS | Status: DC

## 2015-04-02 MED ORDER — FENTANYL CITRATE (PF) 100 MCG/2ML IJ SOLN
INTRAMUSCULAR | Status: DC | PRN
  Administered 2015-04-02: 100 ug via INTRAVENOUS

## 2015-04-02 SURGICAL SUPPLY — 64 items
AGENT HMST SPONGE THK3/8 (HEMOSTASIS)
ARMBAND PINK RESTRICT EXTREMIT (MISCELLANEOUS) ×4 IMPLANT
BAG BANDED W/RUBBER/TAPE 36X54 (MISCELLANEOUS) ×2 IMPLANT
BAG DECANTER FOR FLEXI CONT (MISCELLANEOUS) ×4 IMPLANT
BAG EQP BAND 135X91 W/RBR TAPE (MISCELLANEOUS) ×2
BIOPATCH RED 1 DISK 7.0 (GAUZE/BANDAGES/DRESSINGS) ×3 IMPLANT
BIOPATCH RED 1IN DISK 7.0MM (GAUZE/BANDAGES/DRESSINGS) ×1
CANISTER SUCTION 2500CC (MISCELLANEOUS) ×4 IMPLANT
CATH CANNON HEMO 15FR 23CM (HEMODIALYSIS SUPPLIES) ×2 IMPLANT
CLIP TI MEDIUM 6 (CLIP) ×4 IMPLANT
CLIP TI WIDE RED SMALL 6 (CLIP) ×4 IMPLANT
COVER DOME SNAP 22 D (MISCELLANEOUS) ×2 IMPLANT
COVER PROBE W GEL 5X96 (DRAPES) ×6 IMPLANT
COVER SURGICAL LIGHT HANDLE (MISCELLANEOUS) ×2 IMPLANT
DECANTER SPIKE VIAL GLASS SM (MISCELLANEOUS) ×4 IMPLANT
DRAPE CHEST BREAST 15X10 FENES (DRAPES) ×4 IMPLANT
ELECT REM PT RETURN 9FT ADLT (ELECTROSURGICAL) ×4
ELECTRODE REM PT RTRN 9FT ADLT (ELECTROSURGICAL) ×2 IMPLANT
GAUZE SPONGE 2X2 8PLY STRL LF (GAUZE/BANDAGES/DRESSINGS) ×2 IMPLANT
GAUZE SPONGE 4X4 16PLY XRAY LF (GAUZE/BANDAGES/DRESSINGS) ×2 IMPLANT
GLOVE BIO SURGEON STRL SZ7 (GLOVE) ×6 IMPLANT
GLOVE BIO SURGEON STRL SZ8 (GLOVE) ×4 IMPLANT
GLOVE BIOGEL PI IND STRL 6.5 (GLOVE) IMPLANT
GLOVE BIOGEL PI IND STRL 7.0 (GLOVE) IMPLANT
GLOVE BIOGEL PI IND STRL 7.5 (GLOVE) ×2 IMPLANT
GLOVE BIOGEL PI INDICATOR 6.5 (GLOVE) ×8
GLOVE BIOGEL PI INDICATOR 7.0 (GLOVE) ×2
GLOVE BIOGEL PI INDICATOR 7.5 (GLOVE) ×6
GLOVE ECLIPSE 7.0 STRL STRAW (GLOVE) ×2 IMPLANT
GLOVE ECLIPSE 8.0 STRL XLNG CF (GLOVE) ×4 IMPLANT
GLOVE SURG SS PI 6.5 STRL IVOR (GLOVE) ×4 IMPLANT
GOWN SPEC L3 XXLG W/TWL (GOWN DISPOSABLE) ×4 IMPLANT
GOWN STRL REUS W/ TWL LRG LVL3 (GOWN DISPOSABLE) ×6 IMPLANT
GOWN STRL REUS W/TWL LRG LVL3 (GOWN DISPOSABLE) ×24
HEMOSTAT SPONGE AVITENE ULTRA (HEMOSTASIS) IMPLANT
KIT BASIN OR (CUSTOM PROCEDURE TRAY) ×4 IMPLANT
KIT ROOM TURNOVER OR (KITS) ×4 IMPLANT
LIQUID BAND (GAUZE/BANDAGES/DRESSINGS) ×6 IMPLANT
NDL 18GX1X1/2 (RX/OR ONLY) (NEEDLE) ×2 IMPLANT
NDL HYPO 25GX1X1/2 BEV (NEEDLE) ×2 IMPLANT
NEEDLE 18GX1X1/2 (RX/OR ONLY) (NEEDLE) ×4 IMPLANT
NEEDLE HYPO 25GX1X1/2 BEV (NEEDLE) IMPLANT
NS IRRIG 1000ML POUR BTL (IV SOLUTION) ×4 IMPLANT
PACK CV ACCESS (CUSTOM PROCEDURE TRAY) ×4 IMPLANT
PAD ARMBOARD 7.5X6 YLW CONV (MISCELLANEOUS) ×8 IMPLANT
SOAP 2 % CHG 4 OZ (WOUND CARE) ×4 IMPLANT
SPONGE GAUZE 2X2 STER 10/PKG (GAUZE/BANDAGES/DRESSINGS) ×2
SUT ETHILON 3 0 PS 1 (SUTURE) ×4 IMPLANT
SUT MNCRL AB 4-0 PS2 18 (SUTURE) ×8 IMPLANT
SUT PROLENE 6 0 BV (SUTURE) ×2 IMPLANT
SUT PROLENE 7 0 BV 1 (SUTURE) ×4 IMPLANT
SUT SILK 4 0 (SUTURE) ×4
SUT SILK 4-0 18XBRD TIE 12 (SUTURE) IMPLANT
SUT VIC AB 3-0 SH 27 (SUTURE) ×8
SUT VIC AB 3-0 SH 27X BRD (SUTURE) ×2 IMPLANT
SYR 20CC LL (SYRINGE) ×6 IMPLANT
SYR 3ML LL SCALE MARK (SYRINGE) ×4 IMPLANT
SYR 5ML LL (SYRINGE) ×4 IMPLANT
SYR CONTROL 10ML LL (SYRINGE) ×2 IMPLANT
SYRINGE 10CC LL (SYRINGE) ×4 IMPLANT
TAPE CLOTH SURG 4X10 WHT LF (GAUZE/BANDAGES/DRESSINGS) ×2 IMPLANT
UNDERPAD 30X30 INCONTINENT (UNDERPADS AND DIAPERS) ×4 IMPLANT
WATER STERILE IRR 1000ML POUR (IV SOLUTION) ×4 IMPLANT
WIRE AMPLATZ SS-J .035X180CM (WIRE) IMPLANT

## 2015-04-02 NOTE — Telephone Encounter (Signed)
-----   Message from Mena Goes, RN sent at 04/02/2015 11:25 AM EDT ----- Regarding: schedule   ----- Message -----    From: Conrad Watson, MD    Sent: 04/02/2015  11:08 AM      To: [id number]  RUDIS MERCURE JI:[id number] 1945/06/03  APROCEDURE: 1.  Right internal jugular vein tunneled dialysis catheter placement 2.  Right internal jugular vein cannulation under ultrasound guidance 3.  Right radiocephalic arteriovenous fistula   Asst: Gerri Lins, [facility]   Follow-up: 6 weeks

## 2015-04-02 NOTE — Telephone Encounter (Signed)
No answer, mailed letter, dpm

## 2015-04-02 NOTE — Anesthesia Preprocedure Evaluation (Addendum)
Anesthesia Evaluation  Patient identified by MRN, date of birth, ID band Patient awake    Reviewed: Allergy & Precautions, NPO status , [name] Chart, lab work & pertinent test results  Airway Mallampati: II  TM Distance: >3 FB Neck ROM: Full    Dental  (+) Teeth Intact   Pulmonary Current Smoker,    breath sounds clear to auscultation       Cardiovascular hypertension,  Rhythm:Regular     Neuro/Psych    GI/Hepatic   Endo/Other  diabetes  Renal/GU      Musculoskeletal   Abdominal   Peds  Hematology   Anesthesia Other Findings   Reproductive/Obstetrics                            Anesthesia Physical Anesthesia Plan  ASA: III  Anesthesia Plan: General   Post-op Pain Management:    Induction: Intravenous  Airway Management Planned: LMA  Additional Equipment:   Intra-op Plan:   Post-operative Plan:   Informed Consent: I have reviewed the patients History and Physical, chart, labs and discussed the procedure including the risks, benefits and alternatives for the proposed anesthesia with the patient or authorized representative who has indicated his/her understanding and acceptance.     Plan Discussed with: CRNA and Anesthesiologist  Anesthesia Plan Comments:         Anesthesia Quick Evaluation

## 2015-04-02 NOTE — Op Note (Signed)
OPERATIVE NOTE  PROCEDURE: 1.  Right internal jugular vein tunneled dialysis catheter placement 2.  Right internal jugular vein cannulation under ultrasound guidance 3.  Right radiocephalic arteriovenous fistula    PRE-OPERATIVE DIAGNOSIS: end-stage renal failure  POST-OPERATIVE DIAGNOSIS: same as above  SURGEON: Adele Barthel, MD  ANESTHESIA: local and MAC  ESTIMATED BLOOD LOSS: 30 cc  FINDING(S): 1.  Tips of the catheter in the right atrium on fluoroscopy 2.  No obvious pneumothorax on fluoroscopy 3.  Small diseased radial artery 2-2.5 cm with some calcification evident 4.  Weakly palpable thrill with some pulsatile character to doppler evaluation 5.  Palpable right radial pulse  SPECIMEN(S):  none  INDICATIONS:   [name] is a 70 y.o. male who presents with end stage renal disease.  The patient presents for tunneled dialysis catheter placement.  The patient is aware the risks of tunneled dialysis catheter placement include but are not limited to: bleeding, infection, central venous injury, pneumothorax, possible venous stenosis, possible malpositioning in the venous system, and possible infections related to long-term catheter presence.  The patient was aware of these risks and agreed to proceed.  DESCRIPTION: After written full informed consent was obtained from the patient, the patient was taken back to the operating room.  Prior to induction, the patient was given IV antibiotics.  After obtaining adequate sedation, the patient was prepped and draped in the standard fashion for a chest or neck tunneled dialysis catheter placement.   The cannulation site, the catheter exit site, and tract for the subcutaneous tunnel were then anesthestized with a total of 20 cc of 1% lidocaine without epinephrine.  Under ultrasound guidance, the right internal jugular vein was cannulated with the 18 gauge needle.  A J-wire was then placed down into the inferior vena cava under fluoroscopic  guidance.  The wire was then secured in place with a clamp to the drapes.  I then made stab incisions at the neck and exit sites.   I dissected from the exit site to the cannulation site with a tunneler.   The subcutaneous tunnel was dilated by passing a plastic dilator over the metal dissector. The wire was then unclamped and I removed the needle.  The skin tract and venotomy was dilated serially with dilators.  Finally, the dilator-sheath was placed under fluoroscopic guidance into the superior vena cava.  The dilator and wire were removed.  A 23 cm Diatek catheter was placed under fluoroscopic guidance down into the right atrium.  The sheath was broken and peeled away while holding the catheter cuff at the level of the skin.  The back end of this catheter was transected, and docked onto the tunneler.  The distal catheter was delivered through the subcutaneous tunnel.  The catheter was transected a second time, revealing the two lumens of this catheter.  The ports were docked onto these two lumens.  The catheter collar was then snapped into place.  Each port was tested by aspirating and flushing.  No resistance was noted.  Each port was then thoroughly flushed with heparinized saline.  The catheter was secured in placed with two interrupted stitches of 3-0 Nylon tied to the catheter.  The neck incision was closed with a U-stitch of 4-0 Monocryl.  The neck and chest incision were cleaned and sterile bandages applied.  Each port was then loaded with concentrated heparin (1000 Units/mL) at the manufacturer recommended volumes to each port.  Sterile caps were applied to each port.  On completion fluoroscopy, the tips of the catheter were in the right atrium, and there was no evidence of pneumothorax.  At this point, the drapes were taken down.  The patient reprepped and redraped in the standard fashion for a right arm access procedure.  I turned my attention first to identifying the patient's distal cephalic  vein and radial artery.  Using SonoSite guidance, the location of these vessels were marked out on the skin.   At this point, I injected local anesthetic to obtain a field block of the wrist.  In total, I injected about 10 mL of 1% lidocaine without epinephrine.  I made a longitudinal incision at the level of the wrist over the distal radial artery and dissected through the subcutaneous tissue and fascia to gain exposure of the radial artery.  This was noted to be 2-2.5 mm in diameter externally.  This was dissected out proximally and distally.  I then made a longitudinal incision over the distal cephalic vein and dissected it out.  This was noted to be 3 mm in diameter externally.  The distal segment of the vein was ligated with a  2-0 silk, and the vein was transected.  The proximal segment was interrogated with serial dilators.  The vein accepted up to a 3.5 mm dilator without any difficulty.  I then instilled the heparinized saline into the vein and clamped it.  I then dissected from the cephalic vein exposure to the radial artery exposure with Metzenbaum scissors.  I passed the vein through the new skin tunnel with a pickup, taking care to maintain orientation.  At this point, I reset my exposure of the radial artery and clamped the artery proximally and distally.  I made an arteriotomy with a #11 blade, and then I extended the arteriotomy with a Potts scissor.  I injected heparinized saline proximal and distal to this arteriotomy.  The vein was then sewn to the artery in an end-to-side configuration with a running stitch of 7-0 Prolene.  Prior to completing this anastomosis, I allowed the vein and artery to backbleed.  There was no evidence of clot from any vessels.  I completed the anastomosis in the usual fashion and then released all vessel loops and clamps.  There was a palpable thrill in the venous outflow, and there was a palpable radial pulse.  Proximally, however, there was a pulsatile character to the  doppler signal.  At this point, I irrigated out the surgical wound.  There was no further active bleeding.  The subcutaneous tissue was reapproximated in both incisions with a running stitch of 3-0 Vicryl.  The skin at each incisions was then reapproximated with a running subcuticular stitch of 4-0 Vicryl.  The skin was then cleaned, dried, and reinforced with Dermabond.  The patient tolerated this procedure well.     COMPLICATIONS: none  CONDITION: stable   Adele Barthel, MD [facility] [place] Office: [phone] Pager: [phone]  04/02/2015, 9:48 AM

## 2015-04-02 NOTE — Anesthesia Postprocedure Evaluation (Signed)
Anesthesia Post Note  Patient: [name]  Procedure(s) Performed: Procedure(s) (LRB): INSERTION OF DIALYSIS CATHETER - RIGHT INTERNAL JUGULAR (Right) RIGHT ARM RADIOCEPHALIC ARTERIOVENOUS (AV) FISTULA CREATION (Right)  Patient location during evaluation: PACU Anesthesia Type: General Level of consciousness: awake and awake and alert Pain management: pain level controlled Vital Signs Assessment: post-procedure vital signs reviewed and stable Respiratory status: spontaneous breathing and nonlabored ventilation Cardiovascular status: blood pressure returned to baseline Anesthetic complications: no    Last Vitals:  Filed Vitals:   04/02/15 1321 04/02/15 1322  BP:  156/65  Pulse: 73 73  Temp:    Resp: 14 14    Last Pain:  Filed Vitals:   04/02/15 1323  PainSc: 4                  Lujean Ebright COKER

## 2015-04-02 NOTE — H&P (View-Only) (Signed)
History of Present Illness:  Patient is a 70 y.o. year old male who presents for placement of a permanent hemodialysis access. The patient is right handed, but he does have a ICD on the left side.   .  The patient is not currently on hemodialysis.  Other chronic medical problems include HTN managed with metoprolol, DM managed with insulin Non ischemic cardiomyopathy, CAD managed with ICD and daily 81 mg aspirin.   He has stage IV CKD.    Past Medical History  Diagnosis Date  . Chronic combined systolic and diastolic CHF (congestive heart failure) ([place])     a. 02/2014 Echo: EF 20-25% (new), Gr 2 DD, mod conc LVH, mild AI/AS, mod MR, sev dil LA, mild TR.  Marland Kitchen Nonischemic cardiomyopathy ([place])     a. 02/2014 Echo: EF 20-25%;  b. 03/2014 Cath: LM nl, LAD min irregs, LCX   . Hypertension   . Chronic kidney disease (CKD), stage IV (severe) ([place]) 12/2009    a. baseline creat now ~ 4.  . Erectile dysfunction   . Colonic polyp   . Low serum testosterone level   . Tobacco abuse   . Benign prostatic hypertrophy   . Obesity, Class I, BMI 30-34.9   . Hyperlipidemia   . Adenomatous colon polyp 2006  . H. pylori infection 2013    treated with prevpac  . Mild Ao Stenosis and Insufficiency   . AICD (automatic cardioverter/defibrillator) present     STJ device, implanted 12/03/14 Dr. Lovena Le  . Sleep apnea 2010    Initially unable to afford CPAP  . 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 Gout   . CHF (congestive heart failure) ([place])   . Hyperparathyroidism [facility])     Past Surgical History  Procedure Laterality Date  . Retinal laser procedure      diabetic retinopathy  . Lymph node biopsy      surgical exploration of neck-not entirely clear that this represented a lymph node biopsy  . Colonoscopy  08/23/2004    Dr. Vivi Ferns rectum, diminutive polyp of the rectosigmoid removed, inflamed focally adenomatous polyp  . Left and right heart catheterization with coronary  angiogram N/A 03/19/2014    Procedure: LEFT AND RIGHT HEART CATHETERIZATION WITH CORONARY ANGIOGRAM;  Surgeon: Blane Ohara, MD;  Location[facility]ATH LAB;  Service: Cardiovascular;  Laterality: N/A;  . Cardiac catheterization    . Ep implantable device N/A 12/03/2014    Procedure: ICD Implant;  Surgeon: Evans Lance, MD;  Location: [place] CV LAB;  Service: Cardiovascular;  Laterality: N/A;; St Jude  . Esophagogastroduodenoscopy  2013    Dr. Gala Romney: erosions reminiscent of GAVE but biopsies showed H.pylori gastritis  . Colonoscopy  2013    Dr. Gala Romney: multiple diminutive polyps in distal sigmoid segment, 4 mm pedunculated polyp at hepatic flexure. Tubular adenomas. Surveillance due 2018   . Cholecystectomy N/A 01/07/2015    Procedure: LAPAROSCOPIC CHOLECYSTECTOMY WITH INTRAOPERATIVE CHOLANGIOGRAM;  Surgeon: Donnie Mesa, MD;  Location: [place];  Service: General;  Laterality: N/A;  . Umbilical hernia repair N/A 01/07/2015    Procedure: HERNIA REPAIR UMBILICAL ADULT;  Surgeon: Donnie Mesa, MD;  Location: [place];  Service: General;  Laterality: N/A;  . Ercp N/A 01/09/2015    Procedure: ENDOSCOPIC RETROGRADE CHOLANGIOPANCREATOGRAPHY (ERCP);  Surgeon: Clarene Essex, MD;  Location: [facility] ENDOSCOPY;  Service: Endoscopy;  Laterality: N/A;     Social History Social History  Substance Use  Topics  . Smoking status: Current Some Day Smoker -- 0.25 packs/day for 56 years    Types: Cigarettes    Start date: 05/31/1958  . Smokeless tobacco: Never Used  . Alcohol Use: No    Family History Family History  Problem Relation Age of Onset  . Hypertension Mother   . Cancer Mother   . Cancer Father   . Diabetes Sister     Allergies  Allergies  Allergen Reactions  . Ace Inhibitors Cough     Current Outpatient Prescriptions  Medication Sig Dispense Refill  . albuterol (PROVENTIL HFA;VENTOLIN HFA) 108 (90 Base) MCG/ACT inhaler Inhale 2 puffs into the lungs every 6 (six) hours as needed for  wheezing or shortness of breath. 1 Inhaler 0  . albuterol (PROVENTIL) (2.5 MG/3ML) 0.083% nebulizer solution Take 3 mLs (2.5 mg total) by nebulization every 6 (six) hours as needed for wheezing. 75 mL 12  . aspirin EC 81 MG tablet Take 81 mg by mouth daily.    . calcitRIOL (ROCALTROL) 0.25 MCG capsule Take 1 capsule (0.25 mcg total) by mouth daily. 30 capsule 1  . calcium acetate, Phos Binder, (PHOSLYRA) 667 MG/5ML SOLN Take 667 mg by mouth daily.    . cloNIDine (CATAPRES) 0.3 MG tablet Take 1 tablet (0.3 mg total) by mouth daily. 90 tablet 3  . colchicine 0.6 MG tablet Take 0.6 mg by mouth as needed.    . diphenhydramine-acetaminophen (TYLENOL PM EXTRA STRENGTH) 25-500 MG TABS tablet Take 1 tablet by mouth at bedtime as needed.    . hydrALAZINE (APRESOLINE) 100 MG tablet Take 1 tablet (100 mg total) by mouth 2 (two) times daily. 60 tablet 1  . Insulin Glargine (LANTUS SOLOSTAR) 100 UNIT/ML Solostar Pen Inject 30 Units into the skin daily at 10 pm. (Patient taking differently: Inject 30 Units into the skin every morning. ) 45 mL 1  . metolazone (ZAROXOLYN) 5 MG tablet Take 1 tablet (5 mg total) by mouth daily. 30 tablet 0  . metoprolol succinate (TOPROL-XL) 25 MG 24 hr tablet Take 1 tablet (25 mg total) by mouth daily. 90 tablet 3  . potassium chloride (KLOR-CON) 20 MEQ packet Take by mouth 2 (two) times daily.    . sorbitol 70 % solution Take 15 mLs by mouth daily as needed. For constipation - PRN    . torsemide (DEMADEX) 100 MG tablet Take 1.5 tablets (150 mg total) by mouth 2 (two) times daily. 90 tablet 2   No current facility-administered medications for this visit.    ROS:   General:  No weight loss, Fever, chills  HEENT: No recent headaches, no nasal bleeding, no visual changes, no sore throat  Neurologic: No dizziness, blackouts, seizures. No recent symptoms of stroke or mini- stroke. No recent episodes of slurred speech, or temporary blindness.  Cardiac: No recent episodes of  chest pain/pressure, no shortness of breath at rest.  No shortness of breath with exertion.  Denies history of atrial fibrillation or irregular heartbeat  Vascular: No history of rest pain in feet.  No history of claudication.  No history of non-healing ulcer, No history of DVT   Pulmonary: No home oxygen, no productive cough, no hemoptysis,  No asthma or wheezing  Musculoskeletal:  [ ]  Arthritis, [ ]  Low back pain,  [ ]  Joint pain  Hematologic:No history of hypercoagulable state.  No history of easy bleeding.  No history of anemia  Gastrointestinal: No hematochezia or melena,  No gastroesophageal reflux, no trouble swallowing  Urinary: [  x ] chronic Kidney disease, [ ]  on HD - [ ]  MWF or [ ]  TTHS, [ ]  Burning with urination, [ ]  Frequent urination, [ ]  Difficulty urinating;   Skin: No rashes  Psychological: No history of anxiety,  No history of depression   Physical Examination  Filed Vitals:   03/25/15 1327  BP: 154/86  Pulse: 68  Temp: 97 F (36.1 C)  TempSrc: Oral  Resp: 14  Height: 5\' 9"  (1.753 m)  Weight: 201 lb (91.173 kg)  SpO2: 98%    Body mass index is 29.67 kg/(m^2).  General:  Alert and oriented, no acute distress HEENT: Normal Neck: No bruit or JVD Pulmonary: Clear to auscultation left and wheezing on the right chronic COPD Cardiac: Regular Rate and Rhythm without murmur Gastrointestinal: Soft, non-tender, non-distended, no mass, no scars Skin: No rash Extremity Pulses:  2+ radial, brachial pulses bilaterally Musculoskeletal: No deformity or edema  Neurologic: Upper and lower extremity motor 5/5 and symmetric  DATA:  Vein mapping shows acceptably cephalic vein in the fore are.     ASSESSMENT:  CKD stageIV   PLAN: He is right handed, but has an ICD on the left.  We will plan a right radial cephalic AV fistula by Dr. Scot Dock March 21 st.  The patient and his wife expressed understanding and wish to proceed. Theda Sers, Myranda Pavone [facility] PA-C [facility] [place] Office: [phone]   The patient was seen in conjunction with Dr. Scot Dock

## 2015-04-02 NOTE — Progress Notes (Signed)
Hypoglycemic Event  CBG: 44  Treatment: D50 IV 25 mL  Symptoms: None  Follow-up CBG: Time1150 CBG Result:93  Possible Reasons for Event: Inadequate meal intake  Comments/MD notified:Dr. Marrian Salvage, Richardo Hanks

## 2015-04-02 NOTE — Transfer of Care (Signed)
Immediate Anesthesia Transfer of Care Note  Patient: [name]  Procedure(s) Performed: Procedure(s): INSERTION OF DIALYSIS CATHETER - RIGHT INTERNAL JUGULAR (Right) RIGHT ARM RADIOCEPHALIC ARTERIOVENOUS (AV) FISTULA CREATION (Right)  Patient Location: PACU  Anesthesia Type:MAC  Level of Consciousness: awake, alert  and oriented  Airway & Oxygen Therapy: Patient Spontanous Breathing and Patient connected to nasal cannula oxygen  Post-op Assessment: Report given to RN and Post -op Vital signs reviewed and stable  Post vital signs: Reviewed and stable  Last Vitals:  Filed Vitals:   04/02/15 0710  BP: 150/54  Pulse: 75  Temp: 36.8 C  Resp: 18    Complications: No apparent anesthesia complications

## 2015-04-02 NOTE — Anesthesia Procedure Notes (Signed)
Procedure Name: MAC Date/Time: 04/02/2015 9:18 AM Performed by: Mariea Clonts Pre-anesthesia Checklist: Patient identified, Timeout performed, Emergency Drugs available, Suction available and Patient being monitored Patient Re-evaluated:Patient Re-evaluated prior to inductionOxygen Delivery Method: Nasal cannula Intubation Type: IV induction

## 2015-04-02 NOTE — Interval H&P Note (Signed)
[facility] [place]  History and Physical Update  The patient was interviewed and re-examined.  The patient's previous History and Physical has been reviewed and is unchanged from Dr. Nicole Cella consult except for: interval worsening of kidney function into ESRD.  The new plan is: R RC AVF placement, TDC placement.     The patient is aware the risks include but are not limited to: bleeding, infection, steal syndrome, nerve damage, ischemic monomelic neuropathy, failure to mature, need for additional procedures, death and stroke.   The patient is aware the risks of tunneled dialysis catheter placement include but are not limited to: bleeding, infection, central venous injury, pneumothorax, possible venous stenosis, possible malpositioning in the venous system, and possible infections related to long-term catheter presence.  The patient was aware of these risks and agreed to proceed.  Adele Barthel, MD [facility] [place] Office: [phone] Pager: [phone]  04/02/2015, 8:54 AM

## 2015-04-03 ENCOUNTER — Encounter (HOSPITAL_COMMUNITY): Payer: Self-pay | Admitting: Vascular Surgery

## 2015-04-03 ENCOUNTER — Other Ambulatory Visit: Payer: Self-pay | Admitting: *Deleted

## 2015-04-03 DIAGNOSIS — D689 Coagulation defect, unspecified: Secondary | ICD-10-CM | POA: Diagnosis not present

## 2015-04-03 DIAGNOSIS — D509 Iron deficiency anemia, unspecified: Secondary | ICD-10-CM | POA: Diagnosis not present

## 2015-04-03 DIAGNOSIS — N390 Urinary tract infection, site not specified: Secondary | ICD-10-CM | POA: Diagnosis not present

## 2015-04-03 DIAGNOSIS — N186 End stage renal disease: Secondary | ICD-10-CM | POA: Diagnosis not present

## 2015-04-03 DIAGNOSIS — Z23 Encounter for immunization: Secondary | ICD-10-CM | POA: Diagnosis not present

## 2015-04-03 DIAGNOSIS — E1129 Type 2 diabetes mellitus with other diabetic kidney complication: Secondary | ICD-10-CM | POA: Diagnosis not present

## 2015-04-03 DIAGNOSIS — N2581 Secondary hyperparathyroidism of renal origin: Secondary | ICD-10-CM | POA: Diagnosis not present

## 2015-04-03 NOTE — Patient Outreach (Signed)
04/03/15- Telephone call to patient's home phone for transition of care week 1, no answer to telephone, left voicemail requesting return phone call.  Telephone call to patient's cell phone, no answer to telephone, left voicemail requesting return phone call.  Jacqlyn Larsen [facility], [place] Coordinator [phone]

## 2015-04-06 ENCOUNTER — Other Ambulatory Visit: Payer: Self-pay | Admitting: *Deleted

## 2015-04-06 DIAGNOSIS — N390 Urinary tract infection, site not specified: Secondary | ICD-10-CM | POA: Diagnosis not present

## 2015-04-06 DIAGNOSIS — D509 Iron deficiency anemia, unspecified: Secondary | ICD-10-CM | POA: Diagnosis not present

## 2015-04-06 DIAGNOSIS — D689 Coagulation defect, unspecified: Secondary | ICD-10-CM | POA: Diagnosis not present

## 2015-04-06 DIAGNOSIS — Z23 Encounter for immunization: Secondary | ICD-10-CM | POA: Diagnosis not present

## 2015-04-06 DIAGNOSIS — N186 End stage renal disease: Secondary | ICD-10-CM | POA: Diagnosis not present

## 2015-04-06 DIAGNOSIS — E1129 Type 2 diabetes mellitus with other diabetic kidney complication: Secondary | ICD-10-CM | POA: Diagnosis not present

## 2015-04-06 DIAGNOSIS — N2581 Secondary hyperparathyroidism of renal origin: Secondary | ICD-10-CM | POA: Diagnosis not present

## 2015-04-07 ENCOUNTER — Encounter: Payer: Self-pay | Admitting: *Deleted

## 2015-04-07 ENCOUNTER — Other Ambulatory Visit: Payer: Self-pay | Admitting: *Deleted

## 2015-04-07 VITALS — BP 128/58 | HR 58 | Resp 16 | Ht 69.0 in | Wt 192.0 lb

## 2015-04-07 DIAGNOSIS — I509 Heart failure, unspecified: Secondary | ICD-10-CM

## 2015-04-07 NOTE — Patient Outreach (Signed)
04/07/15- Telephone call for transition of care week, spoke with pt, HIPAA verified, pt reports he has all medications and taking as prescribed.  Pt had dialysis catheter placed on 04/02/15 and has already begun dialysis Monday, Wednesday and Friday.  Weight today 191 pounds.  Pt agreeable to initial home visit on 04/07/15 and wants to participate in care planning at that time, will think about goals.  Jacqlyn Larsen [facility], [place] Coordinator [phone]

## 2015-04-07 NOTE — Patient Outreach (Signed)
[facility]) Care Management   04/07/2015  [name] [birth year] [id number]  [name] is an 70 y.o. male  Subjective: Initial home visit with pt, HIPAA verified, wife present, pt reports he is new to dialysis Monday, Wednesday and Friday and has had 2 treatments and states " It wasn't as bad as I thought"  Pt states he smokes 3 cigarettes per day and not ready to quit.  Pt is weighing daily but not recording, checks CBG up to 6 times per day and does not record, reports " my blood sugar is good".  Pt reports lantus is expensive at 90$ month and "would like to know if there's help for that".  Pt states "fluid in my legs is getting better since I started dialysis".  Objective:   Filed Vitals:   04/07/15 1433  BP: 128/58  Pulse: 58  Resp: 16  Height: 1.753 m (5\' 9" )  Weight: 192 lb (87.091 kg)  SpO2: 99%   ROS  Physical Exam  Constitutional: He is oriented to person, place, and time. He appears well-developed and well-nourished.  HENT:  Head: Normocephalic.  Neck: Normal range of motion. Neck supple.  Cardiovascular: Regular rhythm.   Respiratory: Effort normal and breath sounds normal.  GI: Soft. Bowel sounds are normal.  Musculoskeletal: Normal range of motion. He exhibits edema.  3+ edema right lower extremity 2+ edema left lower extremity  Neurological: He is alert and oriented to person, place, and time.  Skin: Skin is warm and dry.  Psychiatric: He has a normal mood and affect. His behavior is normal. Judgment and thought content normal.    Current Medications:   Current Outpatient Prescriptions  Medication Sig Dispense Refill  . albuterol (PROVENTIL) (2.5 MG/3ML) 0.083% nebulizer solution Take 3 mLs (2.5 mg total) by nebulization every 6 (six) hours as needed for wheezing. 75 mL 12  . aspirin EC 81 MG tablet Take 81 mg by mouth daily.    Marland Kitchen b complex vitamins tablet Take 1 tablet by mouth daily.    . calcium acetate (PHOSLO) 667 MG capsule Take  667 mg by mouth daily. Take with the biggest meal.    . cloNIDine (CATAPRES) 0.3 MG tablet Take 1 tablet (0.3 mg total) by mouth daily. 90 tablet 3  . colchicine 0.6 MG tablet Take 0.6 mg by mouth as needed (gout flare up).     . diphenhydramine-acetaminophen (TYLENOL PM EXTRA STRENGTH) 25-500 MG TABS tablet Take 1 tablet by mouth at bedtime as needed (pain/sleep).     . hydrALAZINE (APRESOLINE) 100 MG tablet Take 1 tablet (100 mg total) by mouth 2 (two) times daily. 60 tablet 1  . Insulin Glargine (LANTUS SOLOSTAR) 100 UNIT/ML Solostar Pen Inject 30 Units into the skin daily at 10 pm. (Patient taking differently: Inject 30 Units into the skin every morning. ) 45 mL 1  . metoprolol succinate (TOPROL-XL) 25 MG 24 hr tablet Take 1 tablet (25 mg total) by mouth daily. (Patient taking differently: Take 50 mg by mouth daily. ) 90 tablet 3  . oxyCODONE-acetaminophen (PERCOCET/ROXICET) 5-325 MG tablet Take 1 tablet by mouth every 6 (six) hours as needed. (Patient taking differently: Take 1-2 tablets by mouth every 4 (four) hours as needed. ) 30 tablet 0  . albuterol (PROVENTIL HFA;VENTOLIN HFA) 108 (90 Base) MCG/ACT inhaler Inhale 2 puffs into the lungs every 6 (six) hours as needed for wheezing or shortness of breath. (Patient not taking: Reported on 04/07/2015) 1 Inhaler 0  No current facility-administered medications for this visit.    Functional Status:   In your present state of health, do you have any difficulty performing the following activities: 04/07/2015 04/02/2015  Hearing? N N  Vision? N Y  Difficulty concentrating or making decisions? N -  Walking or climbing stairs? N N  Dressing or bathing? N N  Doing errands, shopping? N -    Fall/Depression Screening:    PHQ 2/9 Scores 04/07/2015  PHQ - 2 Score 2  PHQ- 9 Score 5   Fall Risk  04/07/2015  Falls in the past year? No  Risk for fall due to : Medication side effect    Assessment:  RN CM observed all medication bottles and reviewed  with pt and wife, RN CM ask pt to start recording CBG in [facility] calendar but pt does not feel this is something he wants to do.  Hgb AIC is 5.9 on 03/09/15.  RN CM sent order for [facility] pharmacy involvement for cost of lantus 90$/ month.  RN CM faxed barrier letter and initial home visit to primary MD Dr. Dennard Schaumann.    THN CM Care Plan Problem One        Most Recent Value   Care Plan Problem One  Knowledge deficit related to CHF   Role Documenting the Problem One  Care Management Coordinator   Care Plan for Problem One  Active   THN Long Term Goal (31-90 days)  pt will have no admissions related to heart failure within 90 days.   THN Long Term Goal Start Date  04/07/15   Interventions for Problem One Long Term Goal  RN CM reviewed CHF zones, gave pt "Living Better with Heart Failure" booklet and reviewed, reviewed EMMI handouts, RN CM reviewed importance of recording daily weights (pt is already weighing)   THN CM Short Term Goal #1 (0-30 days)  pt will verbalize CHF zones within 30 days.   THN CM Short Term Goal #1 Start Date  04/07/15   Interventions for Short Term Goal #1  RN CM showed pt and wife HF zones in [facility] calendar as well as in CHF booklet/ literature, reinforced importance of symptom management and being aware how you are feeling each day.    THN CM Care Plan Problem Two        Most Recent Value   Care Plan Problem Two  Knowledge deficit related to dialysis (pt is new to dialysis)   Role Documenting the Problem Two  Care Management [place] for Problem Two  Active   THN CM Short Term Goal #1 (0-30 days)  Pt will verbalize 3 choices he can make to facilitate making dialysis successful within 30 days.   THN CM Short Term Goal #1 Start Date  04/07/15   Interventions for Short Term Goal #2   RN CM reviewed importance of adherence to renal diet and reviewed food choices,  adherence to fluid restrictions and medications.      Plan: follow up with home visit 04/30/15 Reinforce CHF  education, dialysis education  Jacqlyn Larsen [facility], [place] Coordinator [phone]

## 2015-04-07 NOTE — Addendum Note (Signed)
Addended by: Jacqlyn Larsen A on: 04/07/2015 05:06 PM   Modules accepted: Orders

## 2015-04-08 ENCOUNTER — Telehealth: Payer: Self-pay

## 2015-04-08 DIAGNOSIS — N186 End stage renal disease: Secondary | ICD-10-CM | POA: Diagnosis not present

## 2015-04-08 DIAGNOSIS — N2581 Secondary hyperparathyroidism of renal origin: Secondary | ICD-10-CM | POA: Diagnosis not present

## 2015-04-08 DIAGNOSIS — N390 Urinary tract infection, site not specified: Secondary | ICD-10-CM | POA: Diagnosis not present

## 2015-04-08 DIAGNOSIS — Z23 Encounter for immunization: Secondary | ICD-10-CM | POA: Diagnosis not present

## 2015-04-08 DIAGNOSIS — D509 Iron deficiency anemia, unspecified: Secondary | ICD-10-CM | POA: Diagnosis not present

## 2015-04-08 DIAGNOSIS — D689 Coagulation defect, unspecified: Secondary | ICD-10-CM | POA: Diagnosis not present

## 2015-04-08 DIAGNOSIS — E1129 Type 2 diabetes mellitus with other diabetic kidney complication: Secondary | ICD-10-CM | POA: Diagnosis not present

## 2015-04-08 NOTE — Telephone Encounter (Signed)
Patient referred to [facility] clinic by Memory Dance, Device tech.   Attempted call to patient and left voice mail for return call with call back number.  Patient had hospital discharge on 04/02/2015 related to CHF.

## 2015-04-09 ENCOUNTER — Encounter: Payer: Self-pay | Admitting: Family Medicine

## 2015-04-09 ENCOUNTER — Ambulatory Visit (INDEPENDENT_AMBULATORY_CARE_PROVIDER_SITE_OTHER): Payer: Medicare Other | Admitting: Family Medicine

## 2015-04-09 VITALS — BP 130/48 | HR 76 | Temp 97.8°F | Resp 16 | Ht 71.0 in | Wt 191.0 lb

## 2015-04-09 DIAGNOSIS — Z794 Long term (current) use of insulin: Secondary | ICD-10-CM

## 2015-04-09 DIAGNOSIS — N184 Chronic kidney disease, stage 4 (severe): Secondary | ICD-10-CM | POA: Diagnosis not present

## 2015-04-09 DIAGNOSIS — E119 Type 2 diabetes mellitus without complications: Secondary | ICD-10-CM

## 2015-04-09 DIAGNOSIS — I5020 Unspecified systolic (congestive) heart failure: Secondary | ICD-10-CM | POA: Diagnosis not present

## 2015-04-09 NOTE — Progress Notes (Signed)
Subjective:    Patient ID: [name], male    DOB: 1945/12/05, 70 y.o.   MRN: [id number]  HPI 02/16/15 Patient was seen at the hospital earlier in January for shortness of breath. He was found to have gained 5 pounds rapidly. Chest x-ray revealed congestive heart failure with pulmonary edema. He responded to IV diuretics and was discharged home. He followed up with the cardiology group the following day and was found to be stable was maintained on his diuretics. [name] weight today is 196 pounds according to my scales. However I look back in the last several office visits. In December he weighed 196 pounds and prior to evaluate 197 pounds according to my scale. Therefore the patient is not retaining weight. There is no peripheral edema on examination today. However he does report dyspnea on exertion. He also reports audible wheezing that he hears at night. He denies any cough. He does have a 50-pack-year history of smoking but he has since quit. Patient states that when he uses albuterol, he received some benefit in the shortness of breath improves. He reports dyspnea with minimal ambulation. He has severe congestive heart failure with an ejection fraction of 20-25%. Past medical history is also complicated by borderline hemodialysis dependent chronic kidney disease. He continues to refuseplacement of venous access. However on exam today there is no evidence of fluid retention. He does report dyspnea on exertion. He reports orthopnea. However he also reports wheezing. I'll perform pulmonary function test today in office. FEV1 is 1.31 L. FVC is 2.44 L. FEV1 percentage is 54% which is abnormal. FEV1 is 45% of predicted. 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  AT that time, my plan was:  Patient has class IV congestive heart failure with an ejection fraction of 20-25%. It is certainly possible that this is causing his shortness of breath and dyspnea on exertion. However there is no  evidence of fluid overload on exam today. His weight is stable. I can appreciate no pulmonary edema on exam. Other possible causes of his shortness of breath and dyspnea on exertion include anemia as well as the obvious COPD that he has today on pulmonary function test. I will treat his COPD with Anoro inhalation a day and recheck the patient in 2 weeks. If his shortness of breath has not improved at that point, he may benefit from seeing the congestive heart failure clinic to maximize therapy for congestive heart failure. I see no role for increased diuretics today on his exam.  Hemoglobin was just checked January 11 and was found to be 10.7. Therefore I do not believe his anemia is severe enough to cause of shortness of breath. Recheck in 2 weeks or sooner if worse  03/02/15 Anoro provided no benefit.  He has gained 8 lbs since last OV.  He reports orthopnea, PND, and dyspnea on exertion.  Denies oliguria.  Denies chest pain. Denies pleurisy. Denies hemoptysis. Denies syncope. Denies palpitations.  At that time,my plan was: Patient shows evidence of fluid overload today on exam. This complicated by his borderline stage IV chronic kidney disease. Increase torsemide to 150 mg by mouth twice a day. Recheck on Thursday. At that time I will repeat renal function test along with potassium.  03/05/15 Patient reports worsening dyspnea on exertion. He reports worsening shortness of breath. He reports orthopnea and PND. He has gained an additional pounds even after I increased his torsemide to 150 mg by mouth twice daily. His fluid overload  seems to be worsening. My concern is that he is not responding to aggressive oral diuresis. In the past he has responded remarkably well to IV Lasix.  AT that time, my plan was: Patient has fluid overload today on exam. He is being referred to the emergency room for IV Lasix and lab work to evaluate for worsening kidney failure. We had a long discussion today about the possible  need for dialysis to treat his fluid overload. This is been a frequent discussion with this patient who is recalcitrant to having any access placed in preparation for dialysis. Hopefully he will respond to IV Lasix today.  04/09/15 Since that time, the patient has been admitted to the hospital with congestive heart failure and fluid overload. He has also developed hemodialysis dependent end-stage renal disease. March 16, he had an AV fistula created and also had a dialysis catheter placed into his right jugular.  His weight is stable and remains down from the 204 lbs when I aw him in Feb.   Wt Readings from Last 3 Encounters:  04/09/15 191 lb (86.637 kg)  04/07/15 192 lb (87.091 kg)  04/02/15 191 lb (86.637 kg)  Overall patient says he is doing much better. His orthopnea has resolved. His shortness of breath has resolved. He is having hypoglycemic episodes on 30 units of Lantus a day.  He is also having episodes of hypotension particularly on days he goes to dialysis.  Past Medical History  Diagnosis Date  . Chronic combined systolic and diastolic CHF (congestive heart failure) ([place])     a. 02/2014 Echo: EF 20-25% (new), Gr 2 DD, mod conc LVH, mild AI/AS, mod MR, sev dil LA, mild TR.  Marland Kitchen Nonischemic cardiomyopathy ([place])     a. 02/2014 Echo: EF 20-25%;  b. 03/2014 Cath: LM nl, LAD min irregs, LCX   . Hypertension   . Chronic kidney disease (CKD), stage IV (severe) ([place]) 12/2009    a. baseline creat now ~ 4.  . Erectile dysfunction   . Colonic polyp   . Low serum testosterone level   . Tobacco abuse   . Benign prostatic hypertrophy   . Obesity, Class I, BMI 30-34.9   . Hyperlipidemia   . Adenomatous colon polyp 2006  . H. pylori infection 2013    treated with prevpac  . Mild Ao Stenosis and Insufficiency   . AICD (automatic cardioverter/defibrillator) present     STJ device, implanted 12/03/14 Dr. Lovena Le  . 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 Gout   . CHF  (congestive heart failure) ([place])   . Hyperparathyroidism ([place])   . Shortness of breath dyspnea   . Sleep apnea 2010    does not use CPAP  . Arthritis     Gout  . Constipation    Past Surgical History  Procedure Laterality Date  . Retinal laser procedure      diabetic retinopathy  . Lymph node biopsy      surgical exploration of neck-not entirely clear that this represented a lymph node biopsy  . Colonoscopy  08/23/2004    Dr. Vivi Ferns rectum, diminutive polyp of the rectosigmoid removed, inflamed focally adenomatous polyp  . Left and right heart catheterization with coronary angiogram N/A 03/19/2014    Procedure: LEFT AND RIGHT HEART CATHETERIZATION WITH CORONARY ANGIOGRAM;  Surgeon: Blane Ohara, MD;  Location: [facility] CATH LAB;  Service: Cardiovascular;  Laterality: N/A;  . Cardiac catheterization    . Ep  implantable device N/A 12/03/2014    Procedure: ICD Implant;  Surgeon: Evans Lance, MD;  Location: [place] CV LAB;  Service: Cardiovascular;  Laterality: N/A;; St Jude  . Esophagogastroduodenoscopy  2013    Dr. Gala Romney: erosions reminiscent of GAVE but biopsies showed H.pylori gastritis  . Colonoscopy  2013    Dr. Gala Romney: multiple diminutive polyps in distal sigmoid segment, 4 mm pedunculated polyp at hepatic flexure. Tubular adenomas. Surveillance due 2018   . Cholecystectomy N/A 01/07/2015    Procedure: LAPAROSCOPIC CHOLECYSTECTOMY WITH INTRAOPERATIVE CHOLANGIOGRAM;  Surgeon: Donnie Mesa, MD;  Location: [place];  Service: General;  Laterality: N/A;  . Umbilical hernia repair N/A 01/07/2015    Procedure: HERNIA REPAIR UMBILICAL ADULT;  Surgeon: Donnie Mesa, MD;  Location: [place];  Service: General;  Laterality: N/A;  . Ercp N/A 01/09/2015    Procedure: ENDOSCOPIC RETROGRADE CHOLANGIOPANCREATOGRAPHY (ERCP);  Surgeon: Clarene Essex, MD;  Location: [facility] ENDOSCOPY;  Service: Endoscopy;  Laterality: N/A;  . Insertion of dialysis catheter Right 04/02/2015    Procedure: INSERTION OF  DIALYSIS CATHETER - RIGHT INTERNAL JUGULAR;  Surgeon: Conrad [place], MD;  Location: [place];  Service: Vascular;  Laterality: Right;  . Av fistula placement Right 04/02/2015    Procedure: RIGHT ARM RADIOCEPHALIC ARTERIOVENOUS (AV) FISTULA CREATION;  Surgeon: Conrad Energy, MD;  Location: [place];  Service: Vascular;  Laterality: Right;   Current Outpatient Prescriptions on File Prior to Visit  Medication Sig Dispense Refill  . albuterol (PROVENTIL HFA;VENTOLIN HFA) 108 (90 Base) MCG/ACT inhaler Inhale 2 puffs into the lungs every 6 (six) hours as needed for wheezing or shortness of breath. (Patient not taking: Reported on 04/07/2015) 1 Inhaler 0  . albuterol (PROVENTIL) (2.5 MG/3ML) 0.083% nebulizer solution Take 3 mLs (2.5 mg total) by nebulization every 6 (six) hours as needed for wheezing. 75 mL 12  . aspirin EC 81 MG tablet Take 81 mg by mouth daily.    Marland Kitchen b complex vitamins tablet Take 1 tablet by mouth daily.    . calcium acetate (PHOSLO) 667 MG capsule Take 667 mg by mouth daily. Take with the biggest meal.    . cloNIDine (CATAPRES) 0.3 MG tablet Take 1 tablet (0.3 mg total) by mouth daily. 90 tablet 3  . colchicine 0.6 MG tablet Take 0.6 mg by mouth as needed (gout flare up).     . diphenhydramine-acetaminophen (TYLENOL PM EXTRA STRENGTH) 25-500 MG TABS tablet Take 1 tablet by mouth at bedtime as needed (pain/sleep).     . hydrALAZINE (APRESOLINE) 100 MG tablet Take 1 tablet (100 mg total) by mouth 2 (two) times daily. 60 tablet 1  . Insulin Glargine (LANTUS SOLOSTAR) 100 UNIT/ML Solostar Pen Inject 30 Units into the skin daily at 10 pm. (Patient taking differently: Inject 30 Units into the skin every morning. ) 45 mL 1  . metoprolol succinate (TOPROL-XL) 25 MG 24 hr tablet Take 1 tablet (25 mg total) by mouth daily. (Patient taking differently: Take 50 mg by mouth daily. ) 90 tablet 3  . oxyCODONE-acetaminophen (PERCOCET/ROXICET) 5-325 MG tablet Take 1 tablet by mouth every 6 (six) hours as needed.  (Patient taking differently: Take 1-2 tablets by mouth every 4 (four) hours as needed. ) 30 tablet 0   No current facility-administered medications on file prior to visit.   Allergies  Allergen Reactions  . Ace Inhibitors Cough   Social History   Social History  . Marital Status: Married    Spouse Name: N/A  . Number  of Children: 4  . Years of Education: N/A   Occupational History  . Semi - Retired Administrator    Social History Main Topics  . Smoking status: Current Some Day Smoker -- 0.25 packs/day for 56 years    Types: Cigarettes    Start date: 05/31/1958  . Smokeless tobacco: Never Used  . Alcohol Use: No  . Drug Use: No  . Sexual Activity: Not Currently   Other Topics Concern  . Not on file   Social History Narrative      Review of Systems  All other systems reviewed and are negative.      Objective:   Physical Exam  Neck: Neck supple. No JVD present.  Cardiovascular: Normal rate, regular rhythm and normal heart sounds.   Pulmonary/Chest: Effort normal. No respiratory distress. He has no wheezes. He has no rales.  Abdominal: Soft. Bowel sounds are normal. He exhibits no distension. There is no tenderness. There is no rebound.  Musculoskeletal: He exhibits no edema.  Vitals reviewed.         Assessment & Plan:  End-stage renal disease hemodialysis dependent, congestive heart failure, hypertension, hypoglycemic episodes. Decrease Lantus to 15 units subcutaneous daily to avoid hypoglycemia. Decrease clonidine to one half tablet by mouth daily for 1 week and then discontinue the medication altogether if his blood pressure remains low. Recheck in one month via telephone regarding his blood sugars.

## 2015-04-10 ENCOUNTER — Encounter (HOSPITAL_COMMUNITY): Payer: Medicare Other

## 2015-04-10 ENCOUNTER — Ambulatory Visit: Payer: Medicare Other | Admitting: Vascular Surgery

## 2015-04-10 ENCOUNTER — Other Ambulatory Visit (HOSPITAL_COMMUNITY): Payer: Medicare Other

## 2015-04-10 DIAGNOSIS — N2581 Secondary hyperparathyroidism of renal origin: Secondary | ICD-10-CM | POA: Diagnosis not present

## 2015-04-10 DIAGNOSIS — N186 End stage renal disease: Secondary | ICD-10-CM | POA: Diagnosis not present

## 2015-04-10 DIAGNOSIS — N390 Urinary tract infection, site not specified: Secondary | ICD-10-CM | POA: Diagnosis not present

## 2015-04-10 DIAGNOSIS — D509 Iron deficiency anemia, unspecified: Secondary | ICD-10-CM | POA: Diagnosis not present

## 2015-04-10 DIAGNOSIS — D689 Coagulation defect, unspecified: Secondary | ICD-10-CM | POA: Diagnosis not present

## 2015-04-10 DIAGNOSIS — E1129 Type 2 diabetes mellitus with other diabetic kidney complication: Secondary | ICD-10-CM | POA: Diagnosis not present

## 2015-04-10 DIAGNOSIS — Z23 Encounter for immunization: Secondary | ICD-10-CM | POA: Diagnosis not present

## 2015-04-10 NOTE — Telephone Encounter (Signed)
Attempted call to patient and left voice mail for return call with call back number. Patient had hospital discharge on 04/02/2015 related to CHF.

## 2015-04-13 ENCOUNTER — Other Ambulatory Visit: Payer: Self-pay | Admitting: *Deleted

## 2015-04-13 DIAGNOSIS — Z23 Encounter for immunization: Secondary | ICD-10-CM | POA: Diagnosis not present

## 2015-04-13 DIAGNOSIS — N390 Urinary tract infection, site not specified: Secondary | ICD-10-CM | POA: Diagnosis not present

## 2015-04-13 DIAGNOSIS — D689 Coagulation defect, unspecified: Secondary | ICD-10-CM | POA: Diagnosis not present

## 2015-04-13 DIAGNOSIS — D509 Iron deficiency anemia, unspecified: Secondary | ICD-10-CM | POA: Diagnosis not present

## 2015-04-13 DIAGNOSIS — E1129 Type 2 diabetes mellitus with other diabetic kidney complication: Secondary | ICD-10-CM | POA: Diagnosis not present

## 2015-04-13 DIAGNOSIS — N2581 Secondary hyperparathyroidism of renal origin: Secondary | ICD-10-CM | POA: Diagnosis not present

## 2015-04-13 DIAGNOSIS — N186 End stage renal disease: Secondary | ICD-10-CM | POA: Diagnosis not present

## 2015-04-13 NOTE — Patient Outreach (Signed)
04/13/15-Telephone call to patient for transition of care week 2, spoke with wife, HIPAA verified,  Wife [name] reports "things are going well"- pt has dialysis today, pt continues weighing daily,has all medications and taking as prescribed,  pt did see Dr. Dennard Schaumann primary care MD on 04/09/15 and lantus decreased in half and clonidine decreased in half, MD discussed with pt Hgb AIC of 5.9% per wife report, states pt still has "small amount swelling but no worse", reports "blood sugar good".  No new concerns or issues reported.  PLAN Continue weekly transition of care calls  Jacqlyn Larsen [facility], [place] Coordinator [phone]

## 2015-04-14 ENCOUNTER — Other Ambulatory Visit: Payer: Self-pay | Admitting: *Deleted

## 2015-04-14 NOTE — Patient Outreach (Signed)
04/14/15- Voicemail received from patient stating he "is running out of lantus" and requests telephone call to discuss his options as this medication is expensive. RN CM sent High Priority In basket to [place] reporting pt requests phone call regarding cost of lantus and options. RN CM called patient and informed him [facility] pharmacist will be getting in touch with him,  Pt states he has several days worth on lantus on hand but does not want to run out.  Jacqlyn Larsen [facility], [place] Coordinator [phone]

## 2015-04-14 NOTE — Telephone Encounter (Signed)
Received call back from patient.  Advised the reason for my call was to enroll him in HF program.  He stated he just started dialysis.   I advised would give him a call back about monitoring his device for fluid levels and if enrollment is still recommended.  He stated that would be fine.

## 2015-04-15 DIAGNOSIS — D509 Iron deficiency anemia, unspecified: Secondary | ICD-10-CM | POA: Diagnosis not present

## 2015-04-15 DIAGNOSIS — N2581 Secondary hyperparathyroidism of renal origin: Secondary | ICD-10-CM | POA: Diagnosis not present

## 2015-04-15 DIAGNOSIS — Z23 Encounter for immunization: Secondary | ICD-10-CM | POA: Diagnosis not present

## 2015-04-15 DIAGNOSIS — N186 End stage renal disease: Secondary | ICD-10-CM | POA: Diagnosis not present

## 2015-04-15 DIAGNOSIS — D689 Coagulation defect, unspecified: Secondary | ICD-10-CM | POA: Diagnosis not present

## 2015-04-15 DIAGNOSIS — N390 Urinary tract infection, site not specified: Secondary | ICD-10-CM | POA: Diagnosis not present

## 2015-04-15 DIAGNOSIS — E1129 Type 2 diabetes mellitus with other diabetic kidney complication: Secondary | ICD-10-CM | POA: Diagnosis not present

## 2015-04-16 ENCOUNTER — Encounter (HOSPITAL_COMMUNITY): Payer: Medicare Other

## 2015-04-16 ENCOUNTER — Ambulatory Visit: Payer: Medicare Other | Admitting: Vascular Surgery

## 2015-04-16 ENCOUNTER — Other Ambulatory Visit (HOSPITAL_COMMUNITY): Payer: Medicare Other

## 2015-04-17 ENCOUNTER — Other Ambulatory Visit: Payer: Self-pay

## 2015-04-17 DIAGNOSIS — E1129 Type 2 diabetes mellitus with other diabetic kidney complication: Secondary | ICD-10-CM | POA: Diagnosis not present

## 2015-04-17 DIAGNOSIS — Z992 Dependence on renal dialysis: Secondary | ICD-10-CM | POA: Diagnosis not present

## 2015-04-17 DIAGNOSIS — D689 Coagulation defect, unspecified: Secondary | ICD-10-CM | POA: Diagnosis not present

## 2015-04-17 DIAGNOSIS — D509 Iron deficiency anemia, unspecified: Secondary | ICD-10-CM | POA: Diagnosis not present

## 2015-04-17 DIAGNOSIS — Z23 Encounter for immunization: Secondary | ICD-10-CM | POA: Diagnosis not present

## 2015-04-17 DIAGNOSIS — N186 End stage renal disease: Secondary | ICD-10-CM | POA: Diagnosis not present

## 2015-04-17 DIAGNOSIS — N390 Urinary tract infection, site not specified: Secondary | ICD-10-CM | POA: Diagnosis not present

## 2015-04-17 DIAGNOSIS — N2581 Secondary hyperparathyroidism of renal origin: Secondary | ICD-10-CM | POA: Diagnosis not present

## 2015-04-17 DIAGNOSIS — I120 Hypertensive chronic kidney disease with stage 5 chronic kidney disease or end stage renal disease: Secondary | ICD-10-CM | POA: Diagnosis not present

## 2015-04-17 NOTE — Patient Outreach (Signed)
04/16/15  Subjective:  [name] is a 70 year old male who was referred to pharmacy for medication assistance for insulin and a medication review.  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  On discharge his Potassium 20 mEq and Sorbitol 70% were discontinued.  No new medications were started.  Since discharge he has been started on dialysis.  His provider has recommended he decrease his Lantus to 15 units daily.  In my conversation with [name], he stated he has decreased it except for maybe two days a week he still uses 30 units a day.  He stated he blood sugars are fine.  He did not give me a reading.  He stated he pays $90 for 5 pens.  I calculated that using 30 units a day it would last him 50 days.  The $90 is two copays, for a 60 day supply.  I stated since he has been lowered to 15 units a day, the 5 pens will now last him 100 days, so the $90 (two copays) is a better cost option.  I talked to him about applying for Extra Help.  I stated I would need to get his wife's income as well.  He stated he would have to talk to his wife first.  I gave him my number to call me back once he has talked to his wife.    Objective: Outpatient Encounter Prescriptions as of 04/17/2015  Medication Sig Note  . albuterol (PROVENTIL HFA;VENTOLIN HFA) 108 (90 Base) MCG/ACT inhaler Inhale 2 puffs into the lungs every 6 (six) hours as needed for wheezing or shortness of breath. 04/07/2015: Pt states he has samples but not using at present.  Marland Kitchen albuterol (PROVENTIL) (2.5 MG/3ML) 0.083% nebulizer solution Take 3 mLs (2.5 mg total) by nebulization every 6 (six) hours as needed for wheezing.   Marland Kitchen aspirin EC 81 MG tablet Take 81 mg by mouth daily.   Marland Kitchen b complex vitamins tablet Take 1 tablet by mouth daily.   . calcium acetate (PHOSLO) 667 MG capsule Take 667 mg by mouth daily. Take with the biggest meal.   . cloNIDine (CATAPRES) 0.3 MG tablet Take 1 tablet (0.3 mg  total) by mouth daily. (Patient taking differently: Take 0.15 mg by mouth daily. )   . colchicine 0.6 MG tablet Take 0.6 mg by mouth as needed (gout flare up).    . diphenhydramine-acetaminophen (TYLENOL PM EXTRA STRENGTH) 25-500 MG TABS tablet Take 1 tablet by mouth at bedtime as needed (pain/sleep).    . hydrALAZINE (APRESOLINE) 100 MG tablet Take 1 tablet (100 mg total) by mouth 2 (two) times daily.   . Insulin Glargine (LANTUS SOLOSTAR) 100 UNIT/ML Solostar Pen Inject 30 Units into the skin daily at 10 pm. (Patient taking differently: Inject 15 Units into the skin every morning. )   . metoprolol succinate (TOPROL-XL) 25 MG 24 hr tablet Take 1 tablet (25 mg total) by mouth daily. (Patient taking differently: Take 50 mg by mouth daily. )   . oxyCODONE-acetaminophen (PERCOCET/ROXICET) 5-325 MG tablet Take 1 tablet by mouth every 6 (six) hours as needed. (Patient taking differently: Take 1-2 tablets by mouth every 4 (four) hours as needed. )    No facility-administered encounter medications on file as of 04/17/2015.   Assessment:  Drugs sorted by system:  Neurologic/Psychologic: none  Cardiovascular: aspirin, clonidine, hydralazine, metoprolol  Pulmonary/Allergy: albuterol  Gastrointestinal: none  Endocrine: Lantus  Renal:  calcium acetate  Topical: none  Pain: oxycodone-acetaminophen  Vitamins/Minerals: vitamin B complex,  Infectious Diseases:  None  Miscellaneous: colchicine, diphenhydramine-acetaminophen   Duplications in therapy:  Oxycodone-acetaminophen and diphenhydramine-acetaminophen  Gaps in therapy: None Medications to avoid in the elderly:  Diphenhydramine (Beer's list: confusion, sedation, and anticholinergic effects), clonidine (Beer's list: high risk of CNS effect and risk of bradycardia and orthostatic hypotension) Drug interactions: none Other issues noted:  None  Plan: 1.  I have asked [name] to ask his wife if she would be willing to share her income to  apply for Extra Help.  Otherwise, we will not be able to apply.  He will not qualify for patient assistance from the pharmaceutical companies due not spending $1000 or $2500 out of pocket on his medications.  2.  I have asked him to ask his wife to see if she would be willing to help purchase his Lantus for this month.  He stated he would ask her.   3.  I will call him back on 04/17/15 to determine if he was able to get his Lantus.  If not, I may be able to purchase it for one month using the Pharmacy Emergency Fund.   4.  I will also discuss his use of acetaminophen and diphenhydramine to make sure he is not over using either.   5.  I will follow up with him on 04/17/15.  Deanne Coffer, PharmD, [place] [phone]

## 2015-04-20 ENCOUNTER — Other Ambulatory Visit: Payer: Self-pay | Admitting: Family Medicine

## 2015-04-20 ENCOUNTER — Ambulatory Visit: Payer: Self-pay | Admitting: *Deleted

## 2015-04-20 ENCOUNTER — Other Ambulatory Visit: Payer: Self-pay | Admitting: *Deleted

## 2015-04-20 DIAGNOSIS — D689 Coagulation defect, unspecified: Secondary | ICD-10-CM | POA: Diagnosis not present

## 2015-04-20 DIAGNOSIS — D509 Iron deficiency anemia, unspecified: Secondary | ICD-10-CM | POA: Diagnosis not present

## 2015-04-20 DIAGNOSIS — Z23 Encounter for immunization: Secondary | ICD-10-CM | POA: Diagnosis not present

## 2015-04-20 DIAGNOSIS — N186 End stage renal disease: Secondary | ICD-10-CM | POA: Diagnosis not present

## 2015-04-20 DIAGNOSIS — E1129 Type 2 diabetes mellitus with other diabetic kidney complication: Secondary | ICD-10-CM | POA: Diagnosis not present

## 2015-04-20 MED ORDER — INSULIN GLARGINE 100 UNIT/ML SOLOSTAR PEN: 15.0000 [IU] | PEN_INJECTOR | Freq: Every morning | SUBCUTANEOUS | Status: DC

## 2015-04-20 NOTE — Patient Outreach (Signed)
04/21/15- Telephone call to patient for transition of care week 3, no answer to telephone, left voicemail requesting return phone call.  Jacqlyn Larsen [facility], [place] Coordinator [phone]

## 2015-04-20 NOTE — Telephone Encounter (Signed)
Attempted call to patient and left message for return call.  

## 2015-04-21 ENCOUNTER — Other Ambulatory Visit: Payer: Self-pay

## 2015-04-21 NOTE — Patient Outreach (Signed)
I called Mr. Paller to let him know that I was able to purchase his Lantus using the Pharmacy Emergency Fund.  Due to not being able to break a 5 pack of pens it ended up being a 3 month supply.  I asked again if he was able to speak to his wife about Extra Help and applying for it.  He stated his wife was not willing to share her income.  I explained how it would decrease the cost of all of his medications if they qualified.  He stated he would ask her again.  I will call him back tomorrow to see if he will be able to apply.  If not, he will not qualify for any programs due to not spending $1000 or $2500 out of pocket towards medications.    Deanne Coffer, PharmD, [place] [phone]

## 2015-04-22 ENCOUNTER — Other Ambulatory Visit: Payer: Self-pay | Admitting: *Deleted

## 2015-04-22 ENCOUNTER — Other Ambulatory Visit: Payer: Self-pay

## 2015-04-22 DIAGNOSIS — E1129 Type 2 diabetes mellitus with other diabetic kidney complication: Secondary | ICD-10-CM | POA: Diagnosis not present

## 2015-04-22 DIAGNOSIS — D509 Iron deficiency anemia, unspecified: Secondary | ICD-10-CM | POA: Diagnosis not present

## 2015-04-22 DIAGNOSIS — N186 End stage renal disease: Secondary | ICD-10-CM | POA: Diagnosis not present

## 2015-04-22 DIAGNOSIS — Z23 Encounter for immunization: Secondary | ICD-10-CM | POA: Diagnosis not present

## 2015-04-22 DIAGNOSIS — D689 Coagulation defect, unspecified: Secondary | ICD-10-CM | POA: Diagnosis not present

## 2015-04-22 NOTE — Patient Outreach (Signed)
I called Mr. Holtzinger to discuss Extra Help.  I have to leave a HIPPA compliant message to call me back.  I will try again later.   Deanne Coffer, PharmD, [place] [phone]

## 2015-04-22 NOTE — Patient Outreach (Signed)
04/21/15- Patient called RN CM back late in the day, Transition of care week 3 completed, Pt reports he continues with dialysis 3 times per week and " this is going well"  Pt continues to weigh daily and states " my weight is about the same", "swelling is better, no worse"  Pt reports he does have all medications and taking as prescribed with oversight of spouse.  No new concerns or issues voiced.  Pt reports he is keeping all scheduled appointments.  [facility] CM Care Plan Problem One        Most Recent Value   Care Plan Problem One  Knowledge deficit related to CHF   Role Documenting the Problem One  Care Management Coordinator   Care Plan for Problem One  Active   [facility]N Long Term Goal (31-90 days)  pt will have no admissions related to heart failure within 90 days.   [facility]N Long Term Goal Start Date  04/07/15   Interventions for Problem One Long Term Goal  RN CM reinforced CHF zones and importance of daily weights.   [facility] CM Short Term Goal #1 (0-30 days)  pt will verbalize CHF zones within 30 days.   [facility] CM Short Term Goal #1 Start Date  04/07/15    [facility] CM Care Plan Problem Two        Most Recent Value   Care Plan Problem Two  Knowledge deficit related to dialysis (pt is new to dialysis)   Role Documenting the Problem Two  Care Management Coordinator   Care Plan for Problem Two  Active   [facility]N CM Short Term Goal #1 (0-30 days)  Pt will verbalize 3 choices he can make to facilitate making dialysis successful within 30 days.   [facility]N CM Short Term Goal #1 Start Date  04/07/15   Interventions for Short Term Goal #2   RN CM reinforced renal diet, fluid restrictions.      PLAN Follow up with transition of care   Jacqlyn Larsen [facility], [place] Coordinator [phone]

## 2015-04-24 DIAGNOSIS — Z23 Encounter for immunization: Secondary | ICD-10-CM | POA: Diagnosis not present

## 2015-04-24 DIAGNOSIS — E1129 Type 2 diabetes mellitus with other diabetic kidney complication: Secondary | ICD-10-CM | POA: Diagnosis not present

## 2015-04-24 DIAGNOSIS — D509 Iron deficiency anemia, unspecified: Secondary | ICD-10-CM | POA: Diagnosis not present

## 2015-04-24 DIAGNOSIS — N186 End stage renal disease: Secondary | ICD-10-CM | POA: Diagnosis not present

## 2015-04-24 DIAGNOSIS — D689 Coagulation defect, unspecified: Secondary | ICD-10-CM | POA: Diagnosis not present

## 2015-04-27 ENCOUNTER — Other Ambulatory Visit: Payer: Self-pay | Admitting: Family Medicine

## 2015-04-27 ENCOUNTER — Encounter: Payer: Self-pay | Admitting: Family Medicine

## 2015-04-27 DIAGNOSIS — D689 Coagulation defect, unspecified: Secondary | ICD-10-CM | POA: Diagnosis not present

## 2015-04-27 DIAGNOSIS — D509 Iron deficiency anemia, unspecified: Secondary | ICD-10-CM | POA: Diagnosis not present

## 2015-04-27 DIAGNOSIS — E1129 Type 2 diabetes mellitus with other diabetic kidney complication: Secondary | ICD-10-CM | POA: Diagnosis not present

## 2015-04-27 DIAGNOSIS — N186 End stage renal disease: Secondary | ICD-10-CM | POA: Diagnosis not present

## 2015-04-27 DIAGNOSIS — Z23 Encounter for immunization: Secondary | ICD-10-CM | POA: Diagnosis not present

## 2015-04-27 MED ORDER — HYDRALAZINE HCL 100 MG PO TABS: 100.0000 mg | ORAL_TABLET | Freq: Two times a day (BID) | ORAL | Status: DC

## 2015-04-27 NOTE — Telephone Encounter (Signed)
Medication called/sent to requested pharmacy  

## 2015-04-28 ENCOUNTER — Ambulatory Visit: Payer: Medicare Other | Admitting: *Deleted

## 2015-04-29 DIAGNOSIS — Z23 Encounter for immunization: Secondary | ICD-10-CM | POA: Diagnosis not present

## 2015-04-29 DIAGNOSIS — D509 Iron deficiency anemia, unspecified: Secondary | ICD-10-CM | POA: Diagnosis not present

## 2015-04-29 DIAGNOSIS — D689 Coagulation defect, unspecified: Secondary | ICD-10-CM | POA: Diagnosis not present

## 2015-04-29 DIAGNOSIS — N186 End stage renal disease: Secondary | ICD-10-CM | POA: Diagnosis not present

## 2015-04-29 DIAGNOSIS — E1129 Type 2 diabetes mellitus with other diabetic kidney complication: Secondary | ICD-10-CM | POA: Diagnosis not present

## 2015-04-30 ENCOUNTER — Encounter: Payer: Self-pay | Admitting: *Deleted

## 2015-04-30 ENCOUNTER — Other Ambulatory Visit: Payer: Self-pay | Admitting: *Deleted

## 2015-04-30 NOTE — Patient Outreach (Signed)
[facility]) Care Management   04/30/2015  [name] [birth year] [id number]  [name] is an 70 y.o. male  Subjective: Routine home visit with pt, HIPAA verified, wife present, pt states he is weighing daily and checking blood sugar daily but is not keeping a log and does not want to keep a log.  Pt reports blood sugar "has been pretty good, had one low reading of 46 and I ate food and it came back up"  Pt states most all CBG readings in 100's range, weight has been stable and continues dialysis 3 times per week and says " so much fluid has gone out of my legs"  Objective:   Filed Vitals:   04/30/15 1433  BP: 132/58  Pulse: 77  Resp: 18  SpO2: 97%  CBG today 111 Ranges in low 100's Weight 178 pounds ROS  Physical Exam  Constitutional: He is oriented to person, place, and time. He appears well-developed and well-nourished.  HENT:  Head: Normocephalic.  Neck: Normal range of motion. Neck supple.  Cardiovascular: Normal rate and regular rhythm.   Respiratory: Effort normal and breath sounds normal.  GI: Soft. Bowel sounds are normal.  Musculoskeletal: Normal range of motion.  Dependent edema lower extremities bil Edema much improved  Neurological: He is alert and oriented to person, place, and time.  Skin: Skin is warm and dry.  Psychiatric: He has a normal mood and affect. His behavior is normal. Judgment and thought content normal.    Encounter Medications:   Outpatient Encounter Prescriptions as of 04/30/2015  Medication Sig Note  . albuterol (PROVENTIL) (2.5 MG/3ML) 0.083% nebulizer solution Take 3 mLs (2.5 mg total) by nebulization every 6 (six) hours as needed for wheezing.   Marland Kitchen aspirin EC 81 MG tablet Take 81 mg by mouth daily.   Marland Kitchen b complex vitamins tablet Take 1 tablet by mouth daily.   . calcium acetate (PHOSLO) 667 MG capsule Take 667 mg by mouth daily. Take with the biggest meal.   . colchicine 0.6 MG tablet Take 0.6 mg by mouth as needed  (gout flare up).    . diphenhydramine-acetaminophen (TYLENOL PM EXTRA STRENGTH) 25-500 MG TABS tablet Take 1 tablet by mouth at bedtime as needed (pain/sleep).    . hydrALAZINE (APRESOLINE) 100 MG tablet Take 1 tablet (100 mg total) by mouth 2 (two) times daily.   . Insulin Glargine (LANTUS SOLOSTAR) 100 UNIT/ML Solostar Pen Inject 15 Units into the skin every morning.   . metoprolol succinate (TOPROL-XL) 25 MG 24 hr tablet Take 1 tablet (25 mg total) by mouth daily. (Patient taking differently: Take 50 mg by mouth daily. )   . oxyCODONE-acetaminophen (PERCOCET/ROXICET) 5-325 MG tablet Take 1 tablet by mouth every 6 (six) hours as needed. (Patient taking differently: Take 1-2 tablets by mouth every 4 (four) hours as needed. )   . albuterol (PROVENTIL HFA;VENTOLIN HFA) 108 (90 Base) MCG/ACT inhaler Inhale 2 puffs into the lungs every 6 (six) hours as needed for wheezing or shortness of breath. (Patient not taking: Reported on 04/30/2015) 04/07/2015: Pt states he has samples but not using at present.  . [DISCONTINUED] cloNIDine (CATAPRES) 0.3 MG tablet Take 1 tablet (0.3 mg total) by mouth daily. (Patient taking differently: Take 0.15 mg by mouth daily. )    No facility-administered encounter medications on file as of 04/30/2015.    Functional Status:   In your present state of health, do you have any difficulty performing the following activities: 04/07/2015  04/02/2015  Hearing? N N  Vision? N Y  Difficulty concentrating or making decisions? N -  Walking or climbing stairs? N N  Dressing or bathing? N N  Doing errands, shopping? N -  Preparing Food and eating ? N -  Using the Toilet? N -  In the past six months, have you accidently leaked urine? N -  Do you have problems with loss of bowel control? N -  Managing your Medications? N -  Managing your Finances? N -  Housekeeping or managing your Housekeeping? Y -    Fall/Depression Screening:    PHQ 2/9 Scores 04/07/2015  PHQ - 2 Score 2  PHQ-  9 Score 5    Assessment:  RN CM praised and encouraged pt for trying to adhere to renal and diabetic diet, fluid restrictions, pt feels dialysis has greatly helped him feel better, especially with issue of edema.  Pt sleeping well, appetite good. RN CM discussed tentative discharge for next month or transfer to RN health coach.   [facility] CM Care Plan Problem One        Most Recent Value   Care Plan Problem One  Knowledge deficit related to CHF   Role Documenting the Problem One  Care Management Coordinator   Care Plan for Problem One  Active   [facility]N Long Term Goal (31-90 days)  pt will have no admissions related to heart failure within 90 days.   THN Long Term Goal Start Date  04/07/15   Interventions for Problem One Long Term Goal  RN CM reviewed CHF zones and importance of daily weights, symptom management..   [facility] CM Short Term Goal #1 (0-30 days)  pt will verbalize CHF zones within 30 days.   [facility]N CM Short Term Goal #1 Start Date  04/30/15 [goal restarted- needs reinforcement]   Interventions for Short Term Goal #1  RN CM reviewed each CHF zone and importance of calling early for change in health status, symptoms    [facility] CM Care Plan Problem Two        Most Recent Value   Care Plan Problem Two  Knowledge deficit related to dialysis (pt is new to dialysis)   Role Documenting the Problem Two  Care Management Coordinator   Care Plan for Problem Two  Active   [facility] CM Short Term Goal #1 (0-30 days)  Pt will verbalize 3 choices he can make to facilitate making dialysis successful within 30 days.   [facility] CM Short Term Goal #1 Start Date  04/30/15 [goal restarted-could not name 3 things]   Interventions for Short Term Goal #2   RN CM reviewed renal diet, fluid restrictions, importance of adherence to both Reviewed importance of talking with MD, staff at dialysis about any questions, concerns or problems.      Plan: follow up with home visit 05/28/15 Plan to discharge at next visit or transfer to health  coach Assess weight, CBG  Jacqlyn Larsen [facility], [place] [phone]

## 2015-05-01 DIAGNOSIS — D509 Iron deficiency anemia, unspecified: Secondary | ICD-10-CM | POA: Diagnosis not present

## 2015-05-01 DIAGNOSIS — N186 End stage renal disease: Secondary | ICD-10-CM | POA: Diagnosis not present

## 2015-05-01 DIAGNOSIS — D689 Coagulation defect, unspecified: Secondary | ICD-10-CM | POA: Diagnosis not present

## 2015-05-01 DIAGNOSIS — E1129 Type 2 diabetes mellitus with other diabetic kidney complication: Secondary | ICD-10-CM | POA: Diagnosis not present

## 2015-05-01 DIAGNOSIS — Z23 Encounter for immunization: Secondary | ICD-10-CM | POA: Diagnosis not present

## 2015-05-04 DIAGNOSIS — N186 End stage renal disease: Secondary | ICD-10-CM | POA: Diagnosis not present

## 2015-05-04 DIAGNOSIS — Z23 Encounter for immunization: Secondary | ICD-10-CM | POA: Diagnosis not present

## 2015-05-04 DIAGNOSIS — E1129 Type 2 diabetes mellitus with other diabetic kidney complication: Secondary | ICD-10-CM | POA: Diagnosis not present

## 2015-05-04 DIAGNOSIS — D509 Iron deficiency anemia, unspecified: Secondary | ICD-10-CM | POA: Diagnosis not present

## 2015-05-04 DIAGNOSIS — D689 Coagulation defect, unspecified: Secondary | ICD-10-CM | POA: Diagnosis not present

## 2015-05-06 DIAGNOSIS — Z23 Encounter for immunization: Secondary | ICD-10-CM | POA: Diagnosis not present

## 2015-05-06 DIAGNOSIS — N186 End stage renal disease: Secondary | ICD-10-CM | POA: Diagnosis not present

## 2015-05-06 DIAGNOSIS — D689 Coagulation defect, unspecified: Secondary | ICD-10-CM | POA: Diagnosis not present

## 2015-05-06 DIAGNOSIS — D509 Iron deficiency anemia, unspecified: Secondary | ICD-10-CM | POA: Diagnosis not present

## 2015-05-06 DIAGNOSIS — E1129 Type 2 diabetes mellitus with other diabetic kidney complication: Secondary | ICD-10-CM | POA: Diagnosis not present

## 2015-05-07 ENCOUNTER — Other Ambulatory Visit: Payer: Self-pay

## 2015-05-08 DIAGNOSIS — D509 Iron deficiency anemia, unspecified: Secondary | ICD-10-CM | POA: Diagnosis not present

## 2015-05-08 DIAGNOSIS — D689 Coagulation defect, unspecified: Secondary | ICD-10-CM | POA: Diagnosis not present

## 2015-05-08 DIAGNOSIS — N186 End stage renal disease: Secondary | ICD-10-CM | POA: Diagnosis not present

## 2015-05-08 DIAGNOSIS — E1129 Type 2 diabetes mellitus with other diabetic kidney complication: Secondary | ICD-10-CM | POA: Diagnosis not present

## 2015-05-08 DIAGNOSIS — Z23 Encounter for immunization: Secondary | ICD-10-CM | POA: Diagnosis not present

## 2015-05-11 DIAGNOSIS — E1129 Type 2 diabetes mellitus with other diabetic kidney complication: Secondary | ICD-10-CM | POA: Diagnosis not present

## 2015-05-11 DIAGNOSIS — D689 Coagulation defect, unspecified: Secondary | ICD-10-CM | POA: Diagnosis not present

## 2015-05-11 DIAGNOSIS — Z23 Encounter for immunization: Secondary | ICD-10-CM | POA: Diagnosis not present

## 2015-05-11 DIAGNOSIS — N186 End stage renal disease: Secondary | ICD-10-CM | POA: Diagnosis not present

## 2015-05-11 DIAGNOSIS — D509 Iron deficiency anemia, unspecified: Secondary | ICD-10-CM | POA: Diagnosis not present

## 2015-05-11 NOTE — Patient Outreach (Signed)
I called and left my third message for Mr. Arth with his wife to call me back in regards to the Extra Help application.  He has stated his wife was not interested in sharing her information which would be why they would not qualify.  If I do not hear back from him.  I will send a letter closing him out to pharmacy.    Deanne Coffer, PharmD, [place] [phone]

## 2015-05-12 ENCOUNTER — Encounter: Payer: Self-pay | Admitting: Vascular Surgery

## 2015-05-13 ENCOUNTER — Telehealth: Payer: Self-pay | Admitting: Family Medicine

## 2015-05-13 DIAGNOSIS — Z23 Encounter for immunization: Secondary | ICD-10-CM | POA: Diagnosis not present

## 2015-05-13 DIAGNOSIS — D509 Iron deficiency anemia, unspecified: Secondary | ICD-10-CM | POA: Diagnosis not present

## 2015-05-13 DIAGNOSIS — D689 Coagulation defect, unspecified: Secondary | ICD-10-CM | POA: Diagnosis not present

## 2015-05-13 DIAGNOSIS — N186 End stage renal disease: Secondary | ICD-10-CM | POA: Diagnosis not present

## 2015-05-13 DIAGNOSIS — E1129 Type 2 diabetes mellitus with other diabetic kidney complication: Secondary | ICD-10-CM | POA: Diagnosis not present

## 2015-05-13 NOTE — Telephone Encounter (Signed)
Requesting a refill on his oxycodone - Ok to refill??

## 2015-05-14 MED ORDER — OXYCODONE-ACETAMINOPHEN 5-325 MG PO TABS: 1.0000 | ORAL_TABLET | Freq: Four times a day (QID) | ORAL | Status: DC | PRN

## 2015-05-14 NOTE — Telephone Encounter (Signed)
ok 

## 2015-05-14 NOTE — Telephone Encounter (Signed)
RX printed, left up front and patient's wife aware to pick up after 2pm

## 2015-05-15 ENCOUNTER — Encounter: Payer: Self-pay | Admitting: Internal Medicine

## 2015-05-15 ENCOUNTER — Ambulatory Visit (INDEPENDENT_AMBULATORY_CARE_PROVIDER_SITE_OTHER): Payer: Medicare Other | Admitting: Vascular Surgery

## 2015-05-15 ENCOUNTER — Encounter: Payer: Self-pay | Admitting: Vascular Surgery

## 2015-05-15 ENCOUNTER — Telehealth: Payer: Self-pay | Admitting: *Deleted

## 2015-05-15 ENCOUNTER — Ambulatory Visit: Payer: Medicare Other | Admitting: Vascular Surgery

## 2015-05-15 VITALS — BP 152/63 | HR 89 | Temp 97.2°F | Resp 18 | Ht 69.0 in | Wt 181.0 lb

## 2015-05-15 DIAGNOSIS — D689 Coagulation defect, unspecified: Secondary | ICD-10-CM | POA: Diagnosis not present

## 2015-05-15 DIAGNOSIS — N179 Acute kidney failure, unspecified: Secondary | ICD-10-CM

## 2015-05-15 DIAGNOSIS — D509 Iron deficiency anemia, unspecified: Secondary | ICD-10-CM | POA: Diagnosis not present

## 2015-05-15 DIAGNOSIS — Z23 Encounter for immunization: Secondary | ICD-10-CM | POA: Diagnosis not present

## 2015-05-15 DIAGNOSIS — N186 End stage renal disease: Secondary | ICD-10-CM | POA: Diagnosis not present

## 2015-05-15 DIAGNOSIS — E1129 Type 2 diabetes mellitus with other diabetic kidney complication: Secondary | ICD-10-CM | POA: Diagnosis not present

## 2015-05-15 NOTE — Telephone Encounter (Signed)
LMTCB on home/cell #  VT episode recorded on 4/27 @ 2118, terminated with ATP.

## 2015-05-15 NOTE — Progress Notes (Signed)
    Postoperative Access Visit   History of Present Illness  [name] is a 70 y.o. year old male who presents for postoperative follow-up for: R RCAVF, RIJV TDC (Date: 04/02/15).  The patient's wounds are healed.  The patient notes no steal symptoms.  The patient is able to complete their activities of daily living.  The patient's current symptoms are: none.  For VQI Use Only  PRE-ADM LIVING: Home  AMB STATUS: Ambulatory  Physical Examination Filed Vitals:   05/15/15 0822 05/15/15 0825  BP: 154/70 152/63  Pulse: 89 89  Temp: 97.2 F (36.2 C)   Resp: 18     RUE: Incision is healed, skin feels warm, hand grip is 5/5, sensation in digits is intact, palpable thrill, bruit can be auscultated, on Sonosite: fistula 5 mm distally with vein >5.5 mm proximally  Medical Decision Making  [name] is a 70 y.o. year old male who presents s/p R RC AVF, RIJV TDC.   Fistula not quite ready, suspect will meet 6 mm criteria in next month.  Follow up in 4 weeks.  Thank you for allowing [place] to participate in this patient's care.  Adele Barthel, MD [facility] Office: [phone] Pager: [phone]  05/15/2015, 8:14 AM

## 2015-05-15 NOTE — Progress Notes (Signed)
Filed Vitals:   05/15/15 0822 05/15/15 0825  BP: 154/70 152/63  Pulse: 89 89  Temp: 97.2 F (36.2 C)   Resp: 18   Height: 5\' 9"  (1.753 m)   Weight: 181 lb (82.101 kg)   SpO2: 99%

## 2015-05-17 DIAGNOSIS — Z992 Dependence on renal dialysis: Secondary | ICD-10-CM | POA: Diagnosis not present

## 2015-05-17 DIAGNOSIS — N186 End stage renal disease: Secondary | ICD-10-CM | POA: Diagnosis not present

## 2015-05-17 DIAGNOSIS — I120 Hypertensive chronic kidney disease with stage 5 chronic kidney disease or end stage renal disease: Secondary | ICD-10-CM | POA: Diagnosis not present

## 2015-05-18 DIAGNOSIS — N186 End stage renal disease: Secondary | ICD-10-CM | POA: Diagnosis not present

## 2015-05-18 DIAGNOSIS — Z23 Encounter for immunization: Secondary | ICD-10-CM | POA: Diagnosis not present

## 2015-05-18 DIAGNOSIS — E1129 Type 2 diabetes mellitus with other diabetic kidney complication: Secondary | ICD-10-CM | POA: Diagnosis not present

## 2015-05-18 DIAGNOSIS — D509 Iron deficiency anemia, unspecified: Secondary | ICD-10-CM | POA: Diagnosis not present

## 2015-05-18 DIAGNOSIS — D689 Coagulation defect, unspecified: Secondary | ICD-10-CM | POA: Diagnosis not present

## 2015-05-18 NOTE — Telephone Encounter (Signed)
Spoke to wife about episode. Wife was unsure about sx's or how patient takes his medications.   Wife told me to call tomorrow morning.  I told wife about driving restrictions.  Wife voiced understanding.

## 2015-05-19 NOTE — Telephone Encounter (Signed)
Pt is returning your calls from yesterday and last week.

## 2015-05-19 NOTE — Telephone Encounter (Signed)
[name] is returning your call .Marland Kitchen Please call   Thanks

## 2015-05-20 DIAGNOSIS — N186 End stage renal disease: Secondary | ICD-10-CM | POA: Diagnosis not present

## 2015-05-20 DIAGNOSIS — D689 Coagulation defect, unspecified: Secondary | ICD-10-CM | POA: Diagnosis not present

## 2015-05-20 DIAGNOSIS — Z23 Encounter for immunization: Secondary | ICD-10-CM | POA: Diagnosis not present

## 2015-05-20 DIAGNOSIS — E1129 Type 2 diabetes mellitus with other diabetic kidney complication: Secondary | ICD-10-CM | POA: Diagnosis not present

## 2015-05-20 DIAGNOSIS — D509 Iron deficiency anemia, unspecified: Secondary | ICD-10-CM | POA: Diagnosis not present

## 2015-05-20 NOTE — Telephone Encounter (Signed)
LMTCB//sss 

## 2015-05-21 NOTE — Telephone Encounter (Signed)
Spoke to patient regarding ATP treated VT from 05/14/15. Patient denied symptoms of CP, ShOB, dizziness, or syncope. Patient states that he does take his Toprol XL 25mg  daily even on dialysis days.   Will inform Dr.Taylor about episode and notify patient if there are any further recommendations. Patient voiced understanding.  Patient informed about driving restriction x 6 months.

## 2015-05-22 DIAGNOSIS — D689 Coagulation defect, unspecified: Secondary | ICD-10-CM | POA: Diagnosis not present

## 2015-05-22 DIAGNOSIS — D509 Iron deficiency anemia, unspecified: Secondary | ICD-10-CM | POA: Diagnosis not present

## 2015-05-22 DIAGNOSIS — N186 End stage renal disease: Secondary | ICD-10-CM | POA: Diagnosis not present

## 2015-05-22 DIAGNOSIS — Z23 Encounter for immunization: Secondary | ICD-10-CM | POA: Diagnosis not present

## 2015-05-22 DIAGNOSIS — E1129 Type 2 diabetes mellitus with other diabetic kidney complication: Secondary | ICD-10-CM | POA: Diagnosis not present

## 2015-05-25 ENCOUNTER — Other Ambulatory Visit: Payer: Self-pay

## 2015-05-25 DIAGNOSIS — Z23 Encounter for immunization: Secondary | ICD-10-CM | POA: Diagnosis not present

## 2015-05-25 DIAGNOSIS — D509 Iron deficiency anemia, unspecified: Secondary | ICD-10-CM | POA: Diagnosis not present

## 2015-05-25 DIAGNOSIS — D689 Coagulation defect, unspecified: Secondary | ICD-10-CM | POA: Diagnosis not present

## 2015-05-25 DIAGNOSIS — N186 End stage renal disease: Secondary | ICD-10-CM | POA: Diagnosis not present

## 2015-05-25 DIAGNOSIS — E1129 Type 2 diabetes mellitus with other diabetic kidney complication: Secondary | ICD-10-CM | POA: Diagnosis not present

## 2015-05-25 NOTE — Patient Outreach (Signed)
I never heard back from [name] so I will close his case to pharmacy.  [name] is still active with Jacqlyn Larsen, RN, his nurse care manager.  I will send him a letter stating I am closing his case to pharmacy.    Deanne Coffer, PharmD, [place] [phone]

## 2015-05-26 ENCOUNTER — Telehealth: Payer: Self-pay | Admitting: Internal Medicine

## 2015-05-26 NOTE — Telephone Encounter (Signed)
New Message  Pt stated he has follow up questions for Shakila. Please call back and discuss.

## 2015-05-26 NOTE — Telephone Encounter (Signed)
Patient called back stating that he did not remember having the episode on 4/27 where he received ATP. I explained to patient that it is not uncommon for people to be asymptomatic with these episodes. Patient voiced understanding, but also voiced how upsetting this is for him. I told patient that I was sorry that he felt that way, but the driving restriction is a DMV rule to keep him and others safe on the road.   Again, patient voiced understanding.

## 2015-05-27 DIAGNOSIS — E1129 Type 2 diabetes mellitus with other diabetic kidney complication: Secondary | ICD-10-CM | POA: Diagnosis not present

## 2015-05-27 DIAGNOSIS — Z23 Encounter for immunization: Secondary | ICD-10-CM | POA: Diagnosis not present

## 2015-05-27 DIAGNOSIS — D689 Coagulation defect, unspecified: Secondary | ICD-10-CM | POA: Diagnosis not present

## 2015-05-27 DIAGNOSIS — D509 Iron deficiency anemia, unspecified: Secondary | ICD-10-CM | POA: Diagnosis not present

## 2015-05-27 DIAGNOSIS — N186 End stage renal disease: Secondary | ICD-10-CM | POA: Diagnosis not present

## 2015-05-28 ENCOUNTER — Ambulatory Visit (INDEPENDENT_AMBULATORY_CARE_PROVIDER_SITE_OTHER): Payer: Medicare Other | Admitting: Family Medicine

## 2015-05-28 ENCOUNTER — Encounter: Payer: Self-pay | Admitting: *Deleted

## 2015-05-28 ENCOUNTER — Other Ambulatory Visit: Payer: Self-pay | Admitting: *Deleted

## 2015-05-28 ENCOUNTER — Encounter: Payer: Self-pay | Admitting: Family Medicine

## 2015-05-28 VITALS — BP 150/40 | HR 84 | Temp 98.6°F | Resp 16 | Ht 71.0 in | Wt 178.0 lb

## 2015-05-28 DIAGNOSIS — E119 Type 2 diabetes mellitus without complications: Secondary | ICD-10-CM | POA: Diagnosis not present

## 2015-05-28 DIAGNOSIS — Z794 Long term (current) use of insulin: Secondary | ICD-10-CM | POA: Diagnosis not present

## 2015-05-28 NOTE — Patient Outreach (Signed)
[facility]) Care Management   05/28/2015  [name] [birth year] [id number]  [name] is an 70 y.o. male  Subjective: Routine home visit with pt, HIPAA verified, wife present, pt states dialysis is going well, pt is weighing daily but does not record, pt checks CBG daily and reports last Hgb AIC is 5.5 and states he is seeing primary MD today and may be able to discontinue some of diabetic medications.  Pt reports " medicine the same and I have everything"  Objective:   Filed Vitals:   05/28/15 1220  BP: 110/58  Pulse: 77  Resp: 16  Weight: 173 lb (78.472 kg)  SpO2: 97%  CBG 7 day average 129 14 day average 123 30 day average 122 ROS  Physical Exam  Constitutional: He is oriented to person, place, and time. He appears well-developed and well-nourished.  HENT:  Head: Normocephalic.  Neck: Normal range of motion. Neck supple.  Cardiovascular: Normal rate and regular rhythm.   Respiratory: Effort normal and breath sounds normal.  GI: Soft. Bowel sounds are normal.  Genitourinary:  Dialysis 3 times per week  Musculoskeletal: He exhibits edema.  Dependent edema lower extremities bil  Neurological: He is alert and oriented to person, place, and time.  Skin: Skin is warm and dry.  Psychiatric: He has a normal mood and affect. His behavior is normal. Judgment and thought content normal.    Encounter Medications:   Outpatient Encounter Prescriptions as of 05/28/2015  Medication Sig Note  . aspirin EC 81 MG tablet Take 81 mg by mouth daily.   Marland Kitchen b complex vitamins tablet Take 1 tablet by mouth daily.   . calcium acetate (PHOSLO) 667 MG capsule Take 667 mg by mouth daily. Take with the biggest meal.   . colchicine 0.6 MG tablet Take 0.6 mg by mouth as needed (gout flare up).    . hydrALAZINE (APRESOLINE) 100 MG tablet Take 1 tablet (100 mg total) by mouth 2 (two) times daily. (Patient taking differently: Take 100 mg by mouth 2 (two) times daily. Per patient  taking 146m  Monday, Wednesday and Friday)   . Insulin Glargine (LANTUS SOLOSTAR) 100 UNIT/ML Solostar Pen Inject 15 Units into the skin every morning.   . metoprolol succinate (TOPROL-XL) 25 MG 24 hr tablet Take 1 tablet (25 mg total) by mouth daily. (Patient taking differently: Take 50 mg by mouth daily. )   . [DISCONTINUED] diphenhydramine-acetaminophen (TYLENOL PM EXTRA STRENGTH) 25-500 MG TABS tablet Take 1 tablet by mouth at bedtime as needed (pain/sleep).    .Marland Kitchenalbuterol (PROVENTIL HFA;VENTOLIN HFA) 108 (90 Base) MCG/ACT inhaler Inhale 2 puffs into the lungs every 6 (six) hours as needed for wheezing or shortness of breath. (Patient not taking: Reported on 04/30/2015) 04/07/2015: Pt states he has samples but not using at present.  .Marland Kitchenalbuterol (PROVENTIL) (2.5 MG/3ML) 0.083% nebulizer solution Take 3 mLs (2.5 mg total) by nebulization every 6 (six) hours as needed for wheezing. (Patient not taking: Reported on 05/15/2015)   . [DISCONTINUED] oxyCODONE-acetaminophen (PERCOCET/ROXICET) 5-325 MG tablet Take 1 tablet by mouth every 6 (six) hours as needed. (Patient not taking: Reported on 05/15/2015)    No facility-administered encounter medications on file as of 05/28/2015.    Functional Status:   In your present state of health, do you have any difficulty performing the following activities: 04/07/2015 04/02/2015  Hearing? N N  Vision? N Y  Difficulty concentrating or making decisions? N -  Walking or climbing stairs? N  N  Dressing or bathing? N N  Doing errands, shopping? N -  Preparing Food and eating ? N -  Using the Toilet? N -  In the past six months, have you accidently leaked urine? N -  Do you have problems with loss of bowel control? N -  Managing your Medications? N -  Managing your Finances? N -  Housekeeping or managing your Housekeeping? Y -    Fall/Depression Screening:    PHQ 2/9 Scores 04/07/2015  PHQ - 2 Score 2  PHQ- 9 Score 5    Assessment:  Pt is doing well with daily  weights and checking CBG, pt has lost fluid weight and says he feels better since being on dialysis, RN CM discussed discharge plan with pt and wife and pt does not want to be transferred to RN health coach citing he is busy with dialysis and sees the staff there and feels they are a good resource for him.  RN CM faxed case closure letter to primary care MD Dr. Dennard Schaumann, mailed case closure letter to pt.  [facility] CM Care Plan Problem One        Most Recent Value   Care Plan Problem One  Knowledge deficit related to CHF   Role Documenting the Problem One  Care Management Coordinator   Care Plan for Problem One  Active   [facility] Long Term Goal (31-90 days)  pt will have no admissions related to heart failure within 90 days.   [facility] Long Term Goal Start Date  04/07/15   [facility] Long Term Goal Met Date  05/28/15 [name] met- no readmissions]   Interventions for Problem One Long Term Goal  RN CM reviewed and reinforced CHF zones and importance of daily weights, symptom management..   [facility] CM Short Term Goal #1 (0-30 days)  pt will verbalize CHF zones within 30 days.   [facility] CM Short Term Goal #1 Start Date  04/30/15 [goal restarted- needs reinforcement]   [facility] CM Short Term Goal #1 Met Date  05/28/15   Interventions for Short Term Goal #1  RN CM reviewed action plan and who to call    [facility] CM Care Plan Problem Two        Most Recent Value   Care Plan Problem Two  Knowledge deficit related to dialysis (pt is new to dialysis)   Role Documenting the Problem Two  Care Management [place] for Problem Two  Active   [facility] CM Short Term Goal #1 (0-30 days)  Pt will verbalize 3 choices he can make to facilitate making dialysis successful within 30 days.   [facility] CM Short Term Goal #1 Start Date  04/30/15 [goal restarted-could not name 3 things]   [facility] CM Short Term Goal #1 Met Date   05/28/15 [name] met, pt verbalizes understanding]   Interventions for Short Term Goal #2   RN CM reinforced renal diet, fluid  restrictions, importance of adherence to both.      Plan: discharge today  [name] [facility], BSN [place] Coordinator [phone]

## 2015-05-28 NOTE — Progress Notes (Signed)
Subjective:    Patient ID: [name], male    DOB: 1945/12/05, 70 y.o.   MRN: [id number]  HPI 02/16/15 Patient was seen at the hospital earlier in January for shortness of breath. He was found to have gained 5 pounds rapidly. Chest x-ray revealed congestive heart failure with pulmonary edema. He responded to IV diuretics and was discharged home. He followed up with the cardiology group the following day and was found to be stable was maintained on his diuretics. [name] weight today is 196 pounds according to my scales. However I look back in the last several office visits. In December he weighed 196 pounds and prior to evaluate 197 pounds according to my scale. Therefore the patient is not retaining weight. There is no peripheral edema on examination today. However he does report dyspnea on exertion. He also reports audible wheezing that he hears at night. He denies any cough. He does have a 50-pack-year history of smoking but he has since quit. Patient states that when he uses albuterol, he received some benefit in the shortness of breath improves. He reports dyspnea with minimal ambulation. He has severe congestive heart failure with an ejection fraction of 20-25%. Past medical history is also complicated by borderline hemodialysis dependent chronic kidney disease. He continues to refuseplacement of venous access. However on exam today there is no evidence of fluid retention. He does report dyspnea on exertion. He reports orthopnea. However he also reports wheezing. I'll perform pulmonary function test today in office. FEV1 is 1.31 L. FVC is 2.44 L. FEV1 percentage is 54% which is abnormal. FEV1 is 45% of predicted. 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  AT that time, my plan was:  Patient has class IV congestive heart failure with an ejection fraction of 20-25%. It is certainly possible that this is causing his shortness of breath and dyspnea on exertion. However there is no  evidence of fluid overload on exam today. His weight is stable. I can appreciate no pulmonary edema on exam. Other possible causes of his shortness of breath and dyspnea on exertion include anemia as well as the obvious COPD that he has today on pulmonary function test. I will treat his COPD with Anoro inhalation a day and recheck the patient in 2 weeks. If his shortness of breath has not improved at that point, he may benefit from seeing the congestive heart failure clinic to maximize therapy for congestive heart failure. I see no role for increased diuretics today on his exam.  Hemoglobin was just checked January 11 and was found to be 10.7. Therefore I do not believe his anemia is severe enough to cause of shortness of breath. Recheck in 2 weeks or sooner if worse  03/02/15 Anoro provided no benefit.  He has gained 8 lbs since last OV.  He reports orthopnea, PND, and dyspnea on exertion.  Denies oliguria.  Denies chest pain. Denies pleurisy. Denies hemoptysis. Denies syncope. Denies palpitations.  At that time,my plan was: Patient shows evidence of fluid overload today on exam. This complicated by his borderline stage IV chronic kidney disease. Increase torsemide to 150 mg by mouth twice a day. Recheck on Thursday. At that time I will repeat renal function test along with potassium.  03/05/15 Patient reports worsening dyspnea on exertion. He reports worsening shortness of breath. He reports orthopnea and PND. He has gained an additional pounds even after I increased his torsemide to 150 mg by mouth twice daily. His fluid overload  seems to be worsening. My concern is that he is not responding to aggressive oral diuresis. In the past he has responded remarkably well to IV Lasix.  AT that time, my plan was: Patient has fluid overload today on exam. He is being referred to the emergency room for IV Lasix and lab work to evaluate for worsening kidney failure. We had a long discussion today about the possible  need for dialysis to treat his fluid overload. This is been a frequent discussion with this patient who is recalcitrant to having any access placed in preparation for dialysis. Hopefully he will respond to IV Lasix today.  04/09/15 Since that time, the patient has been admitted to the hospital with congestive heart failure and fluid overload. He has also developed hemodialysis dependent end-stage renal disease. March 16, he had an AV fistula created and also had a dialysis catheter placed into his right jugular.  His weight is stable and remains down from the 204 lbs when I aw him in Feb.   Wt Readings from Last 3 Encounters:  05/28/15 178 lb (80.74 kg)  05/28/15 173 lb (78.472 kg)  05/15/15 181 lb (82.101 kg)  Overall patient says he is doing much better. His orthopnea has resolved. His shortness of breath has resolved. He is having hypoglycemic episodes on 30 units of Lantus a day.  He is also having episodes of hypotension particularly on days he goes to dialysis.  At that time, my plan was: End-stage renal disease hemodialysis dependent, congestive heart failure, hypertension, hypoglycemic episodes. Decrease Lantus to 15 units subcutaneous daily to avoid hypoglycemia. Decrease clonidine to one half tablet by mouth daily for 1 week and then discontinue the medication altogether if his blood pressure remains low. Recheck in one month via telephone regarding his blood sugars.  05/28/15 Continues to do very well since starting dialysis. His hemoglobin A1c has fallen to 5.5. He continues to use very low-dose insulin Lantus 15-20 units a day. He is having hypoglycemic episodes. He is checking his blood sugar 6-8 times a day due to the hypoglycemic episodes  Past Medical History  Diagnosis Date  . Chronic combined systolic and diastolic CHF (congestive heart failure) ([place])     a. 02/2014 Echo: EF 20-25% (new), Gr 2 DD, mod conc LVH, mild AI/AS, mod MR, sev dil LA, mild TR.  Marland Kitchen Nonischemic cardiomyopathy  ([place])     a. 02/2014 Echo: EF 20-25%;  b. 03/2014 Cath: LM nl, LAD min irregs, LCX   . Hypertension   . Chronic kidney disease (CKD), stage IV (severe) ([place]) 12/2009    a. baseline creat now ~ 4.  . Erectile dysfunction   . Colonic polyp   . Low serum testosterone level   . Tobacco abuse   . Benign prostatic hypertrophy   . Obesity, Class I, BMI 30-34.9   . Hyperlipidemia   . Adenomatous colon polyp 2006  . H. pylori infection 2013    treated with prevpac  . Mild Ao Stenosis and Insufficiency   . AICD (automatic cardioverter/defibrillator) present     STJ device, implanted 12/03/14 Dr. Lovena Le  . 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 Gout   . CHF (congestive heart failure) ([place])   . Hyperparathyroidism ([place])   . Shortness of breath dyspnea   . Sleep apnea 2010    does not use CPAP  . Arthritis     Gout  . Constipation    Past Surgical  History  Procedure Laterality Date  . Retinal laser procedure      diabetic retinopathy  . Lymph node biopsy      surgical exploration of neck-not entirely clear that this represented a lymph node biopsy  . Colonoscopy  08/23/2004    Dr. Vivi Ferns rectum, diminutive polyp of the rectosigmoid removed, inflamed focally adenomatous polyp  . Left and right heart catheterization with coronary angiogram N/A 03/19/2014    Procedure: LEFT AND RIGHT HEART CATHETERIZATION WITH CORONARY ANGIOGRAM;  Surgeon: Blane Ohara, MD;  Location[facility] CATH LAB;  Service: Cardiovascular;  Laterality: N/A;  . Cardiac catheterization    . Ep implantable device N/A 12/03/2014    Procedure: ICD Implant;  Surgeon: Evans Lance, MD;  Location: [place] CV LAB;  Service: Cardiovascular;  Laterality: N/A;; St Jude  . Esophagogastroduodenoscopy  2013    Dr. Gala Romney: erosions reminiscent of GAVE but biopsies showed H.pylori gastritis  . Colonoscopy  2013    Dr. Gala Romney: multiple diminutive polyps in distal sigmoid segment, 4 mm pedunculated polyp  at hepatic flexure. Tubular adenomas. Surveillance due 2018   . Cholecystectomy N/A 01/07/2015    Procedure: LAPAROSCOPIC CHOLECYSTECTOMY WITH INTRAOPERATIVE CHOLANGIOGRAM;  Surgeon: Donnie Mesa, MD;  Location: [place];  Service: General;  Laterality: N/A;  . Umbilical hernia repair N/A 01/07/2015    Procedure: HERNIA REPAIR UMBILICAL ADULT;  Surgeon: Donnie Mesa, MD;  Location: [place];  Service: General;  Laterality: N/A;  . Ercp N/A 01/09/2015    Procedure: ENDOSCOPIC RETROGRADE CHOLANGIOPANCREATOGRAPHY (ERCP);  Surgeon: Clarene Essex, MD;  Location: [facility] ENDOSCOPY;  Service: Endoscopy;  Laterality: N/A;  . Insertion of dialysis catheter Right 04/02/2015    Procedure: INSERTION OF DIALYSIS CATHETER - RIGHT INTERNAL JUGULAR;  Surgeon: Conrad Osseo, MD;  Location: [place];  Service: Vascular;  Laterality: Right;  . Av fistula placement Right 04/02/2015    Procedure: RIGHT ARM RADIOCEPHALIC ARTERIOVENOUS (AV) FISTULA CREATION;  Surgeon: Conrad [place], MD;  Location: [place];  Service: Vascular;  Laterality: Right;   Current Outpatient Prescriptions on File Prior to Visit  Medication Sig Dispense Refill  . aspirin EC 81 MG tablet Take 81 mg by mouth daily.    Marland Kitchen b complex vitamins tablet Take 1 tablet by mouth daily.    . calcium acetate (PHOSLO) 667 MG capsule Take 667 mg by mouth daily. Take with the biggest meal.    . colchicine 0.6 MG tablet Take 0.6 mg by mouth as needed (gout flare up).     . hydrALAZINE (APRESOLINE) 100 MG tablet Take 1 tablet (100 mg total) by mouth 2 (two) times daily. (Patient taking differently: Take 100 mg by mouth 2 (two) times daily. Per patient taking 100mg   Monday, Wednesday and Friday) 60 tablet 1  . Insulin Glargine (LANTUS SOLOSTAR) 100 UNIT/ML Solostar Pen Inject 15 Units into the skin every morning. 45 mL 1  . metoprolol succinate (TOPROL-XL) 25 MG 24 hr tablet Take 1 tablet (25 mg total) by mouth daily. (Patient taking differently: Take 50 mg by mouth daily. ) 90 tablet 3   . albuterol (PROVENTIL HFA;VENTOLIN HFA) 108 (90 Base) MCG/ACT inhaler Inhale 2 puffs into the lungs every 6 (six) hours as needed for wheezing or shortness of breath. (Patient not taking: Reported on 04/30/2015) 1 Inhaler 0  . albuterol (PROVENTIL) (2.5 MG/3ML) 0.083% nebulizer solution Take 3 mLs (2.5 mg total) by nebulization every 6 (six) hours as needed for wheezing. (Patient not taking: Reported on 05/15/2015) 75 mL  12   No current facility-administered medications on file prior to visit.   Allergies  Allergen Reactions  . Ace Inhibitors Cough   Social History   Social History  . Marital Status: Married    Spouse Name: N/A  . Number of Children: 4  . Years of Education: N/A   Occupational History  . Semi - Retired Administrator    Social History Main Topics  . Smoking status: Current Some Day Smoker -- 0.25 packs/day for 56 years    Types: Cigarettes    Start date: 05/31/1958  . Smokeless tobacco: Never Used  . Alcohol Use: No  . Drug Use: No  . Sexual Activity: Not Currently   Other Topics Concern  . Not on file   Social History Narrative      Review of Systems  All other systems reviewed and are negative.      Objective:   Physical Exam  Neck: Neck supple. No JVD present.  Cardiovascular: Normal rate, regular rhythm and normal heart sounds.   Pulmonary/Chest: Effort normal. No respiratory distress. He has no wheezes. He has no rales.  Abdominal: Soft. Bowel sounds are normal. He exhibits no distension. There is no tenderness. There is no rebound.  Musculoskeletal: He exhibits no edema.  Vitals reviewed.         Assessment & Plan:  Diabetes mellitus, type II, insulin dependent ([place])  I recommended that the patient simply discontinue Lantus altogether. Certainly his blood sugars will rise but I believe they will not rise to a significant level but warrants treatment. Because of his renal failure, his endogenous insulin has a longer half-life and is  working more effectively decreasing his need for synthetic insulin. Discontinue Lantus.

## 2015-05-29 DIAGNOSIS — E1129 Type 2 diabetes mellitus with other diabetic kidney complication: Secondary | ICD-10-CM | POA: Diagnosis not present

## 2015-05-29 DIAGNOSIS — Z23 Encounter for immunization: Secondary | ICD-10-CM | POA: Diagnosis not present

## 2015-05-29 DIAGNOSIS — D689 Coagulation defect, unspecified: Secondary | ICD-10-CM | POA: Diagnosis not present

## 2015-05-29 DIAGNOSIS — N186 End stage renal disease: Secondary | ICD-10-CM | POA: Diagnosis not present

## 2015-05-29 DIAGNOSIS — D509 Iron deficiency anemia, unspecified: Secondary | ICD-10-CM | POA: Diagnosis not present

## 2015-06-01 DIAGNOSIS — Z23 Encounter for immunization: Secondary | ICD-10-CM | POA: Diagnosis not present

## 2015-06-01 DIAGNOSIS — N186 End stage renal disease: Secondary | ICD-10-CM | POA: Diagnosis not present

## 2015-06-01 DIAGNOSIS — D689 Coagulation defect, unspecified: Secondary | ICD-10-CM | POA: Diagnosis not present

## 2015-06-01 DIAGNOSIS — D509 Iron deficiency anemia, unspecified: Secondary | ICD-10-CM | POA: Diagnosis not present

## 2015-06-01 DIAGNOSIS — E1129 Type 2 diabetes mellitus with other diabetic kidney complication: Secondary | ICD-10-CM | POA: Diagnosis not present

## 2015-06-03 DIAGNOSIS — D689 Coagulation defect, unspecified: Secondary | ICD-10-CM | POA: Diagnosis not present

## 2015-06-03 DIAGNOSIS — N186 End stage renal disease: Secondary | ICD-10-CM | POA: Diagnosis not present

## 2015-06-03 DIAGNOSIS — Z23 Encounter for immunization: Secondary | ICD-10-CM | POA: Diagnosis not present

## 2015-06-03 DIAGNOSIS — D509 Iron deficiency anemia, unspecified: Secondary | ICD-10-CM | POA: Diagnosis not present

## 2015-06-03 DIAGNOSIS — E1129 Type 2 diabetes mellitus with other diabetic kidney complication: Secondary | ICD-10-CM | POA: Diagnosis not present

## 2015-06-05 ENCOUNTER — Other Ambulatory Visit: Payer: Self-pay | Admitting: *Deleted

## 2015-06-05 DIAGNOSIS — D509 Iron deficiency anemia, unspecified: Secondary | ICD-10-CM | POA: Diagnosis not present

## 2015-06-05 DIAGNOSIS — Z23 Encounter for immunization: Secondary | ICD-10-CM | POA: Diagnosis not present

## 2015-06-05 DIAGNOSIS — N186 End stage renal disease: Secondary | ICD-10-CM | POA: Diagnosis not present

## 2015-06-05 DIAGNOSIS — E1129 Type 2 diabetes mellitus with other diabetic kidney complication: Secondary | ICD-10-CM | POA: Diagnosis not present

## 2015-06-05 DIAGNOSIS — D689 Coagulation defect, unspecified: Secondary | ICD-10-CM | POA: Diagnosis not present

## 2015-06-05 MED ORDER — CALCIUM ACETATE 667 MG PO CAPS: 667.0000 mg | ORAL_CAPSULE | Freq: Every day | ORAL | Status: AC

## 2015-06-05 NOTE — Telephone Encounter (Signed)
Received fax requesting refill on Phoslo.   Refill appropriate and filled per protocol.

## 2015-06-08 DIAGNOSIS — E1129 Type 2 diabetes mellitus with other diabetic kidney complication: Secondary | ICD-10-CM | POA: Diagnosis not present

## 2015-06-08 DIAGNOSIS — N186 End stage renal disease: Secondary | ICD-10-CM | POA: Diagnosis not present

## 2015-06-08 DIAGNOSIS — D689 Coagulation defect, unspecified: Secondary | ICD-10-CM | POA: Diagnosis not present

## 2015-06-08 DIAGNOSIS — D509 Iron deficiency anemia, unspecified: Secondary | ICD-10-CM | POA: Diagnosis not present

## 2015-06-08 DIAGNOSIS — Z23 Encounter for immunization: Secondary | ICD-10-CM | POA: Diagnosis not present

## 2015-06-10 DIAGNOSIS — Z23 Encounter for immunization: Secondary | ICD-10-CM | POA: Diagnosis not present

## 2015-06-10 DIAGNOSIS — N186 End stage renal disease: Secondary | ICD-10-CM | POA: Diagnosis not present

## 2015-06-10 DIAGNOSIS — E1129 Type 2 diabetes mellitus with other diabetic kidney complication: Secondary | ICD-10-CM | POA: Diagnosis not present

## 2015-06-10 DIAGNOSIS — D509 Iron deficiency anemia, unspecified: Secondary | ICD-10-CM | POA: Diagnosis not present

## 2015-06-10 DIAGNOSIS — D689 Coagulation defect, unspecified: Secondary | ICD-10-CM | POA: Diagnosis not present

## 2015-06-11 ENCOUNTER — Ambulatory Visit (INDEPENDENT_AMBULATORY_CARE_PROVIDER_SITE_OTHER): Payer: Medicare Other | Admitting: *Deleted

## 2015-06-11 DIAGNOSIS — I429 Cardiomyopathy, unspecified: Secondary | ICD-10-CM

## 2015-06-11 NOTE — Progress Notes (Signed)
Remote ICD transmission.   

## 2015-06-12 DIAGNOSIS — N186 End stage renal disease: Secondary | ICD-10-CM | POA: Diagnosis not present

## 2015-06-12 DIAGNOSIS — Z23 Encounter for immunization: Secondary | ICD-10-CM | POA: Diagnosis not present

## 2015-06-12 DIAGNOSIS — D509 Iron deficiency anemia, unspecified: Secondary | ICD-10-CM | POA: Diagnosis not present

## 2015-06-12 DIAGNOSIS — D689 Coagulation defect, unspecified: Secondary | ICD-10-CM | POA: Diagnosis not present

## 2015-06-12 DIAGNOSIS — E1129 Type 2 diabetes mellitus with other diabetic kidney complication: Secondary | ICD-10-CM | POA: Diagnosis not present

## 2015-06-14 ENCOUNTER — Encounter: Payer: Self-pay | Admitting: Family Medicine

## 2015-06-15 DIAGNOSIS — D509 Iron deficiency anemia, unspecified: Secondary | ICD-10-CM | POA: Diagnosis not present

## 2015-06-15 DIAGNOSIS — Z23 Encounter for immunization: Secondary | ICD-10-CM | POA: Diagnosis not present

## 2015-06-15 DIAGNOSIS — E1129 Type 2 diabetes mellitus with other diabetic kidney complication: Secondary | ICD-10-CM | POA: Diagnosis not present

## 2015-06-15 DIAGNOSIS — D689 Coagulation defect, unspecified: Secondary | ICD-10-CM | POA: Diagnosis not present

## 2015-06-15 DIAGNOSIS — N186 End stage renal disease: Secondary | ICD-10-CM | POA: Diagnosis not present

## 2015-06-16 ENCOUNTER — Encounter: Payer: Self-pay | Admitting: Vascular Surgery

## 2015-06-16 ENCOUNTER — Encounter: Payer: Self-pay | Admitting: Family Medicine

## 2015-06-16 ENCOUNTER — Ambulatory Visit (INDEPENDENT_AMBULATORY_CARE_PROVIDER_SITE_OTHER): Payer: Medicare Other | Admitting: Family Medicine

## 2015-06-16 VITALS — BP 162/60 | HR 72 | Temp 98.1°F | Resp 18 | Wt 184.0 lb

## 2015-06-16 DIAGNOSIS — N184 Chronic kidney disease, stage 4 (severe): Secondary | ICD-10-CM | POA: Diagnosis not present

## 2015-06-16 DIAGNOSIS — M1000 Idiopathic gout, unspecified site: Secondary | ICD-10-CM | POA: Diagnosis not present

## 2015-06-16 DIAGNOSIS — M25562 Pain in left knee: Secondary | ICD-10-CM | POA: Diagnosis not present

## 2015-06-16 LAB — URIC ACID: Uric Acid, Serum: 3.9 mg/dL — ABNORMAL LOW (ref 4.0–7.8)

## 2015-06-16 NOTE — Progress Notes (Signed)
Subjective:    Patient ID: [name], male    DOB: Nov 20, 1945, 70 y.o.   MRN: [id number]  HPI Patient presents with the sudden onset of left knee pain beginning at the end of last week. He denies any falls or injuries. The left knee has an effusion. He has pain with varus or valgus stress. He has pain with standing. He denies any locking or laxity in the knee joint. He also has developed right-sided olecranon bursitis at the exact same time. This raises the concern about gout. Past Medical History  Diagnosis Date  . Chronic combined systolic and diastolic CHF (congestive heart failure) ([place])     a. 02/2014 Echo: EF 20-25% (new), Gr 2 DD, mod conc LVH, mild AI/AS, mod MR, sev dil LA, mild TR.  Marland Kitchen Nonischemic cardiomyopathy ([place])     a. 02/2014 Echo: EF 20-25%;  b. 03/2014 Cath: LM nl, LAD min irregs, LCX   . Hypertension   . Chronic kidney disease (CKD), stage IV (severe) ([place]) 12/2009    a. baseline creat now ~ 4.  . Erectile dysfunction   . Colonic polyp   . Low serum testosterone level   . Tobacco abuse   . Benign prostatic hypertrophy   . Obesity, Class I, BMI 30-34.9   . Hyperlipidemia   . Adenomatous colon polyp 2006  . H. pylori infection 2013    treated with prevpac  . Mild Ao Stenosis and Insufficiency   . AICD (automatic cardioverter/defibrillator) present     STJ device, implanted 12/03/14 Dr. Lovena Le  . 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 Gout   . CHF (congestive heart failure) ([place])   . Hyperparathyroidism ([place])   . Shortness of breath dyspnea   . Sleep apnea 2010    does not use CPAP  . Arthritis     Gout  . Constipation    Past Surgical History  Procedure Laterality Date  . Retinal laser procedure      diabetic retinopathy  . Lymph node biopsy      surgical exploration of neck-not entirely clear that this represented a lymph node biopsy  . Colonoscopy  08/23/2004    Dr. Vivi Ferns rectum, diminutive polyp of the rectosigmoid  removed, inflamed focally adenomatous polyp  . Left and right heart catheterization with coronary angiogram N/A 03/19/2014    Procedure: LEFT AND RIGHT HEART CATHETERIZATION WITH CORONARY ANGIOGRAM;  Surgeon: Blane Ohara, MD;  [facility] C[facility] LAB;  Service: Cardiovascular;  Laterality: N/A;  . Cardiac catheterization    . Ep implantable device N/A 12/03/2014    Procedure: ICD Implant;  Surgeon: Evans Lance, MD;  Location: [place] CV LAB;  Service: Cardiovascular;  Laterality: N/A;; St Jude  . Esophagogastroduodenoscopy  2013    Dr. Gala Romney: erosions reminiscent of GAVE but biopsies showed H.pylori gastritis  . Colonoscopy  2013    Dr. Gala Romney: multiple diminutive polyps in distal sigmoid segment, 4 mm pedunculated polyp at hepatic flexure. Tubular adenomas. Surveillance due 2018   . Cholecystectomy N/A 01/07/2015    Procedure: LAPAROSCOPIC CHOLECYSTECTOMY WITH INTRAOPERATIVE CHOLANGIOGRAM;  Surgeon: Donnie Mesa, MD;  Location: [place];  Service: General;  Laterality: N/A;  . Umbilical hernia repair N/A 01/07/2015    Procedure: HERNIA REPAIR UMBILICAL ADULT;  Surgeon: Donnie Mesa, MD;  Location: [place];  Service: General;  Laterality: N/A;  . Ercp N/A 01/09/2015    Procedure: ENDOSCOPIC RETROGRADE CHOLANGIOPANCREATOGRAPHY (ERCP);  Surgeon:  Clarene Essex, MD;  Location: [facility] ENDOSCOPY;  Service: Endoscopy;  Laterality: N/A;  . Insertion of dialysis catheter Right 04/02/2015    Procedure: INSERTION OF DIALYSIS CATHETER - RIGHT INTERNAL JUGULAR;  Surgeon: Conrad [place], MD;  Location: [place];  Service: Vascular;  Laterality: Right;  . Av fistula placement Right 04/02/2015    Procedure: RIGHT ARM RADIOCEPHALIC ARTERIOVENOUS (AV) FISTULA CREATION;  Surgeon: Conrad , MD;  Location: [place];  Service: Vascular;  Laterality: Right;   Current Outpatient Prescriptions on File Prior to Visit  Medication Sig Dispense Refill  . albuterol (PROVENTIL HFA;VENTOLIN HFA) 108 (90 Base) MCG/ACT inhaler Inhale 2  puffs into the lungs every 6 (six) hours as needed for wheezing or shortness of breath. 1 Inhaler 0  . albuterol (PROVENTIL) (2.5 MG/3ML) 0.083% nebulizer solution Take 3 mLs (2.5 mg total) by nebulization every 6 (six) hours as needed for wheezing. 75 mL 12  . aspirin EC 81 MG tablet Take 81 mg by mouth daily.    Marland Kitchen b complex vitamins tablet Take 1 tablet by mouth daily.    . calcium acetate (PHOSLO) 667 MG capsule Take 1 capsule (667 mg total) by mouth daily. Take with the biggest meal. 90 capsule 1  . colchicine 0.6 MG tablet Take 0.6 mg by mouth as needed (gout flare up).     . hydrALAZINE (APRESOLINE) 100 MG tablet Take 1 tablet (100 mg total) by mouth 2 (two) times daily. (Patient taking differently: Take 100 mg by mouth 2 (two) times daily. Per patient taking 100mg   Monday, Wednesday and Friday) 60 tablet 1  . metoprolol succinate (TOPROL-XL) 25 MG 24 hr tablet Take 1 tablet (25 mg total) by mouth daily. (Patient taking differently: Take 50 mg by mouth daily. ) 90 tablet 3  . Insulin Glargine (LANTUS SOLOSTAR) 100 UNIT/ML Solostar Pen Inject 15 Units into the skin every morning. (Patient not taking: Reported on 06/16/2015) 45 mL 1   No current facility-administered medications on file prior to visit.   Allergies  Allergen Reactions  . Ace Inhibitors Cough   Social History   Social History  . Marital Status: Married    Spouse Name: N/A  . Number of Children: 4  . Years of Education: N/A   Occupational History  . Semi - Retired Administrator    Social History Main Topics  . Smoking status: Current Some Day Smoker -- 0.25 packs/day for 56 years    Types: Cigarettes    Start date: 05/31/1958  . Smokeless tobacco: Never Used  . Alcohol Use: No  . Drug Use: No  . Sexual Activity: Not Currently   Other Topics Concern  . Not on file   Social History Narrative      Review of Systems  All other systems reviewed and are negative.      Objective:   Physical Exam    Cardiovascular: Normal rate, regular rhythm and normal heart sounds.   Pulmonary/Chest: Effort normal and breath sounds normal.  Musculoskeletal:       Right elbow: He exhibits swelling. Tenderness found. Olecranon process tenderness noted.       Left knee: He exhibits decreased range of motion, swelling and effusion. Tenderness found. Medial joint line and lateral joint line tenderness noted.  Vitals reviewed.         Assessment & Plan:  Acute idiopathic gout, unspecified site - Plan: Uric acid  Chronic kidney disease (CKD), stage IV (severe) ([place]) - Plan: Uric acid  Left  knee pain  Using sterile technique, I injected the left knee with 2 mL of lidocaine, 2 mL of Marcaine, and 2 mL of 40 mg per mL Kenalog. Patient tolerated the procedure well. However I'm also check a uric acid level to see if his multiple joint complaints could be secondary to his gout. We discussed aspiration of olecranon bursitis but he elects against this today.

## 2015-06-17 DIAGNOSIS — D509 Iron deficiency anemia, unspecified: Secondary | ICD-10-CM | POA: Diagnosis not present

## 2015-06-17 DIAGNOSIS — E1129 Type 2 diabetes mellitus with other diabetic kidney complication: Secondary | ICD-10-CM | POA: Diagnosis not present

## 2015-06-17 DIAGNOSIS — Z992 Dependence on renal dialysis: Secondary | ICD-10-CM | POA: Diagnosis not present

## 2015-06-17 DIAGNOSIS — D689 Coagulation defect, unspecified: Secondary | ICD-10-CM | POA: Diagnosis not present

## 2015-06-17 DIAGNOSIS — Z23 Encounter for immunization: Secondary | ICD-10-CM | POA: Diagnosis not present

## 2015-06-17 DIAGNOSIS — I120 Hypertensive chronic kidney disease with stage 5 chronic kidney disease or end stage renal disease: Secondary | ICD-10-CM | POA: Diagnosis not present

## 2015-06-17 DIAGNOSIS — N186 End stage renal disease: Secondary | ICD-10-CM | POA: Diagnosis not present

## 2015-06-17 NOTE — Progress Notes (Addendum)
    Postoperative Access Visit   History of Present Illness  [name] is a 70 y.o. (04-21-1945) male  who presents for postoperative follow-up for: R RCAVF, RIJV TDC (Date: 04/02/15). The patient's wounds are healed. The patient notes no steal symptoms. The patient is able to complete their activities of daily living. The patient's current symptoms are: none.  For VQI Use Only  PRE-ADM LIVING: Home  AMB STATUS: Ambulatory  Physical Examination Filed Vitals:   06/19/15 0844 06/19/15 0845  BP: 153/66 151/68  Pulse: 78   Height: 5\' 9"  (1.753 m)   Weight: 184 lb (83.462 kg)   SpO2: 98%     RUE: Incision is healed, skin feels warm, hand grip is 5/5, sensation in digits is intact, palpable thrill, bruit can be auscultated, on Sonosite: 5.5-5.8 mm, visible distal half of fistula   Medical Decision Making  [name] is a 70 y.o. (Jul 24, 1945) male who presents s/p R RC AVF, RIJV TDC.   AVF is nearly at target diameter and visible.  I think it's ok to start cannulating.  If unsuccessful, rest arm for one more month and try again.  If still unsuccessful, send the patient back for a balloon assisted maturation procedure.  Thank you for allowing [place] to participate in this patient's care.  Adele Barthel, MD, FACS [facility] Office: [phone] Pager: [phone]  06/19/2015, 8:28 AM

## 2015-06-18 ENCOUNTER — Telehealth: Payer: Self-pay | Admitting: *Deleted

## 2015-06-18 NOTE — Telephone Encounter (Signed)
Patient is aware of his lab result.  Patient states that his left knee is still swollen with some pain.  Please advise

## 2015-06-18 NOTE — Telephone Encounter (Signed)
Please allow through the weekend, it sometimes takes 3 or more days for cortisone to kick in.  Call if no better by Monday.

## 2015-06-19 ENCOUNTER — Encounter: Payer: Self-pay | Admitting: Vascular Surgery

## 2015-06-19 ENCOUNTER — Ambulatory Visit (INDEPENDENT_AMBULATORY_CARE_PROVIDER_SITE_OTHER): Payer: Medicare Other | Admitting: Vascular Surgery

## 2015-06-19 VITALS — BP 151/68 | HR 78 | Ht 69.0 in | Wt 184.0 lb

## 2015-06-19 DIAGNOSIS — D509 Iron deficiency anemia, unspecified: Secondary | ICD-10-CM | POA: Diagnosis not present

## 2015-06-19 DIAGNOSIS — Z23 Encounter for immunization: Secondary | ICD-10-CM | POA: Diagnosis not present

## 2015-06-19 DIAGNOSIS — N186 End stage renal disease: Secondary | ICD-10-CM | POA: Diagnosis not present

## 2015-06-19 DIAGNOSIS — D689 Coagulation defect, unspecified: Secondary | ICD-10-CM | POA: Diagnosis not present

## 2015-06-19 DIAGNOSIS — D631 Anemia in chronic kidney disease: Secondary | ICD-10-CM | POA: Diagnosis not present

## 2015-06-19 DIAGNOSIS — N185 Chronic kidney disease, stage 5: Secondary | ICD-10-CM

## 2015-06-19 DIAGNOSIS — E1129 Type 2 diabetes mellitus with other diabetic kidney complication: Secondary | ICD-10-CM | POA: Diagnosis not present

## 2015-06-19 NOTE — Telephone Encounter (Signed)
Left message on machine for patient to return our call 

## 2015-06-22 ENCOUNTER — Encounter: Payer: Self-pay | Admitting: Internal Medicine

## 2015-06-22 DIAGNOSIS — Z23 Encounter for immunization: Secondary | ICD-10-CM | POA: Diagnosis not present

## 2015-06-22 DIAGNOSIS — D689 Coagulation defect, unspecified: Secondary | ICD-10-CM | POA: Diagnosis not present

## 2015-06-22 DIAGNOSIS — D509 Iron deficiency anemia, unspecified: Secondary | ICD-10-CM | POA: Diagnosis not present

## 2015-06-22 DIAGNOSIS — N186 End stage renal disease: Secondary | ICD-10-CM | POA: Diagnosis not present

## 2015-06-22 DIAGNOSIS — E1129 Type 2 diabetes mellitus with other diabetic kidney complication: Secondary | ICD-10-CM | POA: Diagnosis not present

## 2015-06-22 DIAGNOSIS — D631 Anemia in chronic kidney disease: Secondary | ICD-10-CM | POA: Diagnosis not present

## 2015-06-24 ENCOUNTER — Encounter: Payer: Self-pay | Admitting: Cardiology

## 2015-06-24 DIAGNOSIS — D509 Iron deficiency anemia, unspecified: Secondary | ICD-10-CM | POA: Diagnosis not present

## 2015-06-24 DIAGNOSIS — N186 End stage renal disease: Secondary | ICD-10-CM | POA: Diagnosis not present

## 2015-06-24 DIAGNOSIS — Z23 Encounter for immunization: Secondary | ICD-10-CM | POA: Diagnosis not present

## 2015-06-24 DIAGNOSIS — D631 Anemia in chronic kidney disease: Secondary | ICD-10-CM | POA: Diagnosis not present

## 2015-06-24 DIAGNOSIS — D689 Coagulation defect, unspecified: Secondary | ICD-10-CM | POA: Diagnosis not present

## 2015-06-24 DIAGNOSIS — E1129 Type 2 diabetes mellitus with other diabetic kidney complication: Secondary | ICD-10-CM | POA: Diagnosis not present

## 2015-06-25 NOTE — Telephone Encounter (Signed)
Unable to reach patient for further follow up 

## 2015-06-26 DIAGNOSIS — E1129 Type 2 diabetes mellitus with other diabetic kidney complication: Secondary | ICD-10-CM | POA: Diagnosis not present

## 2015-06-26 DIAGNOSIS — Z23 Encounter for immunization: Secondary | ICD-10-CM | POA: Diagnosis not present

## 2015-06-26 DIAGNOSIS — D631 Anemia in chronic kidney disease: Secondary | ICD-10-CM | POA: Diagnosis not present

## 2015-06-26 DIAGNOSIS — D689 Coagulation defect, unspecified: Secondary | ICD-10-CM | POA: Diagnosis not present

## 2015-06-26 DIAGNOSIS — D509 Iron deficiency anemia, unspecified: Secondary | ICD-10-CM | POA: Diagnosis not present

## 2015-06-26 DIAGNOSIS — N186 End stage renal disease: Secondary | ICD-10-CM | POA: Diagnosis not present

## 2015-06-29 DIAGNOSIS — D509 Iron deficiency anemia, unspecified: Secondary | ICD-10-CM | POA: Diagnosis not present

## 2015-06-29 DIAGNOSIS — D689 Coagulation defect, unspecified: Secondary | ICD-10-CM | POA: Diagnosis not present

## 2015-06-29 DIAGNOSIS — N186 End stage renal disease: Secondary | ICD-10-CM | POA: Diagnosis not present

## 2015-06-29 DIAGNOSIS — Z23 Encounter for immunization: Secondary | ICD-10-CM | POA: Diagnosis not present

## 2015-06-29 DIAGNOSIS — D631 Anemia in chronic kidney disease: Secondary | ICD-10-CM | POA: Diagnosis not present

## 2015-06-29 DIAGNOSIS — E1129 Type 2 diabetes mellitus with other diabetic kidney complication: Secondary | ICD-10-CM | POA: Diagnosis not present

## 2015-06-29 LAB — CUP PACEART REMOTE DEVICE CHECK
Battery Remaining Percentage: 89 %
Battery Voltage: 3.1 V
Date Time Interrogation Session: 20170525060016
HIGH POWER IMPEDANCE MEASURED VALUE: 71 Ohm
HIGH POWER IMPEDANCE MEASURED VALUE: 71 Ohm
Implantable Lead Implant Date: 20161116
Lead Channel Impedance Value: 430 Ohm
Lead Channel Pacing Threshold Pulse Width: 0.5 ms
Lead Channel Sensing Intrinsic Amplitude: 12 mV
MDC IDC LEAD LOCATION: 753860
MDC IDC LEAD MODEL: 7122
MDC IDC MSMT BATTERY REMAINING LONGEVITY: 91 mo
MDC IDC MSMT LEADCHNL RV PACING THRESHOLD AMPLITUDE: 0.75 V
MDC IDC PG SERIAL: 7228612
MDC IDC SET LEADCHNL RV PACING AMPLITUDE: 2.5 V
MDC IDC SET LEADCHNL RV PACING PULSEWIDTH: 0.5 ms
MDC IDC SET LEADCHNL RV SENSING SENSITIVITY: 0.5 mV
MDC IDC STAT BRADY RV PERCENT PACED: 1 %

## 2015-07-01 DIAGNOSIS — N186 End stage renal disease: Secondary | ICD-10-CM | POA: Diagnosis not present

## 2015-07-01 DIAGNOSIS — Z23 Encounter for immunization: Secondary | ICD-10-CM | POA: Diagnosis not present

## 2015-07-01 DIAGNOSIS — E1129 Type 2 diabetes mellitus with other diabetic kidney complication: Secondary | ICD-10-CM | POA: Diagnosis not present

## 2015-07-01 DIAGNOSIS — D631 Anemia in chronic kidney disease: Secondary | ICD-10-CM | POA: Diagnosis not present

## 2015-07-01 DIAGNOSIS — D689 Coagulation defect, unspecified: Secondary | ICD-10-CM | POA: Diagnosis not present

## 2015-07-01 DIAGNOSIS — D509 Iron deficiency anemia, unspecified: Secondary | ICD-10-CM | POA: Diagnosis not present

## 2015-07-02 ENCOUNTER — Encounter: Payer: Self-pay | Admitting: Family Medicine

## 2015-07-02 ENCOUNTER — Other Ambulatory Visit: Payer: Self-pay | Admitting: Family Medicine

## 2015-07-02 ENCOUNTER — Ambulatory Visit (INDEPENDENT_AMBULATORY_CARE_PROVIDER_SITE_OTHER): Payer: Medicare Other | Admitting: Family Medicine

## 2015-07-02 VITALS — BP 170/58 | HR 80 | Temp 97.8°F | Resp 16 | Ht 71.0 in | Wt 180.0 lb

## 2015-07-02 DIAGNOSIS — Z992 Dependence on renal dialysis: Secondary | ICD-10-CM | POA: Diagnosis not present

## 2015-07-02 DIAGNOSIS — E119 Type 2 diabetes mellitus without complications: Secondary | ICD-10-CM

## 2015-07-02 DIAGNOSIS — M1000 Idiopathic gout, unspecified site: Secondary | ICD-10-CM

## 2015-07-02 DIAGNOSIS — E785 Hyperlipidemia, unspecified: Secondary | ICD-10-CM

## 2015-07-02 DIAGNOSIS — N186 End stage renal disease: Secondary | ICD-10-CM | POA: Diagnosis not present

## 2015-07-02 DIAGNOSIS — Z794 Long term (current) use of insulin: Secondary | ICD-10-CM

## 2015-07-02 DIAGNOSIS — Z0000 Encounter for general adult medical examination without abnormal findings: Secondary | ICD-10-CM | POA: Diagnosis not present

## 2015-07-02 DIAGNOSIS — I5020 Unspecified systolic (congestive) heart failure: Secondary | ICD-10-CM | POA: Diagnosis not present

## 2015-07-02 DIAGNOSIS — Z125 Encounter for screening for malignant neoplasm of prostate: Secondary | ICD-10-CM | POA: Diagnosis not present

## 2015-07-02 NOTE — Progress Notes (Signed)
Subjective:    Patient ID: [name], male    DOB: 18-Sep-1945, 70 y.o.   MRN: [id number]  HPI Here for CPE.  Last colonoscopy performed by Dr. Gala Romney in 08/2011.  [place] that he has had the shingles vaccine and last for 5 years although he cannot recollect where. He is overdue for an eye exam. Diabetic foot exam is performed today and is normal. He is due for hepatitis C screening. He is also due for a PSA but he declines a digital rectal exam. Blood pressure today is extremely high. However 90% of the time he is actually hypotensive according to his report. On dialysis days he frequently becomes orthostatic and almost passes out. He states that his blood pressures typically 120/80. Since discontinuing insulin, his fasting blood sugars 120 and his two-hour postprandial sugars between 150 and 170  Immunization History  Administered Date(s) Administered  . Influenza Split 11/02/2010, 10/17/2012, 10/11/2013  . Influenza Whole 11/01/2006, 10/15/2007, 10/20/2008, 10/19/2009  . Influenza,inj,Quad PF,36+ Mos 09/30/2014  . Pneumococcal Conjugate-13 04/09/2013  . Pneumococcal Polysaccharide-23 09/15/2008  . Td 09/15/2008    Past Medical History  Diagnosis Date  . Chronic combined systolic and diastolic CHF (congestive heart failure) ([place])     a. 02/2014 Echo: EF 20-25% (new), Gr 2 DD, mod conc LVH, mild AI/AS, mod MR, sev dil LA, mild TR.  Marland Kitchen Nonischemic cardiomyopathy ([place])     a. 02/2014 Echo: EF 20-25%;  b. 03/2014 Cath: LM nl, LAD min irregs, LCX   . Hypertension   . Chronic kidney disease (CKD), stage IV (severe) ([place]) 12/2009    a. baseline creat now ~ 4.  . Erectile dysfunction   . Colonic polyp   . Low serum testosterone level   . Tobacco abuse   . Benign prostatic hypertrophy   . Obesity, Class I, BMI 30-34.9   . Hyperlipidemia   . Adenomatous colon polyp 2006  . H. pylori infection 2013    treated with prevpac  . Mild Ao Stenosis and Insufficiency   . AICD (automatic  cardioverter/defibrillator) present     STJ device, implanted 12/03/14 Dr. Lovena Le  . 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 Gout   . CHF (congestive heart failure) ([place])   . Hyperparathyroidism ([place])   . Shortness of breath dyspnea   . Sleep apnea 2010    does not use CPAP  . Arthritis     Gout  . Constipation    Past Surgical History  Procedure Laterality Date  . Retinal laser procedure      diabetic retinopathy  . Lymph node biopsy      surgical exploration of neck-not entirely clear that this represented a lymph node biopsy  . Colonoscopy  08/23/2004    Dr. Vivi Ferns rectum, diminutive polyp of the rectosigmoid removed, inflamed focally adenomatous polyp  . Left and right heart catheterization with coronary angiogram N/A 03/19/2014    Procedure: LEFT AND RIGHT HEART CATHETERIZATION WITH CORONARY ANGIOGRAM;  Surgeon: Blane Ohara, MD;  Location: [facility] CATH LAB;  Service: Cardiovascular;  Laterality: N/A;  . Cardiac catheterization    . Ep implantable device N/A 12/03/2014    Procedure: ICD Implant;  Surgeon: Evans Lance, MD;  Location: [place] CV LAB;  Service: Cardiovascular;  Laterality: N/A;; St Jude  . Esophagogastroduodenoscopy  2013    Dr. Gala Romney: erosions reminiscent of GAVE but biopsies showed H.pylori gastritis  . Colonoscopy  2013  Dr. Gala Romney: multiple diminutive polyps in distal sigmoid segment, 4 mm pedunculated polyp at hepatic flexure. Tubular adenomas. Surveillance due 2018   . Cholecystectomy N/A 01/07/2015    Procedure: LAPAROSCOPIC CHOLECYSTECTOMY WITH INTRAOPERATIVE CHOLANGIOGRAM;  Surgeon: Donnie Mesa, MD;  Location: [place];  Service: General;  Laterality: N/A;  . Umbilical hernia repair N/A 01/07/2015    Procedure: HERNIA REPAIR UMBILICAL ADULT;  Surgeon: Donnie Mesa, MD;  Location: [place];  Service: General;  Laterality: N/A;  . Ercp N/A 01/09/2015    Procedure: ENDOSCOPIC RETROGRADE CHOLANGIOPANCREATOGRAPHY (ERCP);   Surgeon: Clarene Essex, MD;  Location: [facility] ENDOSCOPY;  Service: Endoscopy;  Laterality: N/A;  . Insertion of dialysis catheter Right 04/02/2015    Procedure: INSERTION OF DIALYSIS CATHETER - RIGHT INTERNAL JUGULAR;  Surgeon: Conrad [place], MD;  Location: [place];  Service: Vascular;  Laterality: Right;  . Av fistula placement Right 04/02/2015    Procedure: RIGHT ARM RADIOCEPHALIC ARTERIOVENOUS (AV) FISTULA CREATION;  Surgeon: Conrad Gerty, MD;  Location: [place];  Service: Vascular;  Laterality: Right;   Current Outpatient Prescriptions on File Prior to Visit  Medication Sig Dispense Refill  . albuterol (PROVENTIL HFA;VENTOLIN HFA) 108 (90 Base) MCG/ACT inhaler Inhale 2 puffs into the lungs every 6 (six) hours as needed for wheezing or shortness of breath. (Patient not taking: Reported on 06/19/2015) 1 Inhaler 0  . albuterol (PROVENTIL) (2.5 MG/3ML) 0.083% nebulizer solution Take 3 mLs (2.5 mg total) by nebulization every 6 (six) hours as needed for wheezing. (Patient not taking: Reported on 06/19/2015) 75 mL 12  . aspirin EC 81 MG tablet Take 81 mg by mouth daily.    Marland Kitchen b complex vitamins tablet Take 1 tablet by mouth daily.    . calcium acetate (PHOSLO) 667 MG capsule Take 1 capsule (667 mg total) by mouth daily. Take with the biggest meal. 90 capsule 1  . colchicine 0.6 MG tablet Take 0.6 mg by mouth as needed (gout flare up).     . hydrALAZINE (APRESOLINE) 100 MG tablet Take 1 tablet (100 mg total) by mouth 2 (two) times daily. (Patient taking differently: Take 100 mg by mouth 2 (two) times daily. Per patient taking 100mg   Monday, Wednesday and Friday) 60 tablet 1  . Insulin Glargine (LANTUS SOLOSTAR) 100 UNIT/ML Solostar Pen Inject 15 Units into the skin every morning. (Patient not taking: Reported on 06/16/2015) 45 mL 1  . metoprolol succinate (TOPROL-XL) 25 MG 24 hr tablet Take 1 tablet (25 mg total) by mouth daily. (Patient taking differently: Take 50 mg by mouth daily. ) 90 tablet 3  . ONE TOUCH ULTRA TEST  test strip      No current facility-administered medications on file prior to visit.   Allergies  Allergen Reactions  . Ace Inhibitors Cough   Social History   Social History  . Marital Status: Married    Spouse Name: N/A  . Number of Children: 4  . Years of Education: N/A   Occupational History  . Semi - Retired Administrator    Social History Main Topics  . Smoking status: Current Some Day Smoker -- 0.25 packs/day for 56 years    Types: Cigarettes    Start date: 05/31/1958  . Smokeless tobacco: Never Used  . Alcohol Use: No  . Drug Use: No  . Sexual Activity: Not Currently   Other Topics Concern  . Not on file   Social History Narrative   Family History  Problem Relation Age of Onset  . Hypertension  Mother   . Cancer Mother   . Cancer Father   . Diabetes Sister      Review of Systems  All other systems reviewed and are negative.      Objective:   Physical Exam  Constitutional: He is oriented to person, place, and time. He appears well-developed and well-nourished. No distress.  HENT:  Head: Normocephalic and atraumatic.  Right Ear: External ear normal.  Left Ear: External ear normal.  Nose: Nose normal.  Mouth/Throat: Oropharynx is clear and moist. No oropharyngeal exudate.  Eyes: Conjunctivae and EOM are normal. Pupils are equal, round, and reactive to light. Right eye exhibits no discharge. Left eye exhibits no discharge. No scleral icterus.  Neck: Normal range of motion. Neck supple. No JVD present. No tracheal deviation present. No thyromegaly present.  Cardiovascular: Normal rate, regular rhythm, normal heart sounds and intact distal pulses.  Exam reveals no gallop and no friction rub.   No murmur heard. Pulmonary/Chest: Effort normal and breath sounds normal. No stridor. No respiratory distress. He has no wheezes. He has no rales. He exhibits no tenderness.  Abdominal: Soft. Bowel sounds are normal. He exhibits no distension and no mass. There is no  tenderness. There is no rebound and no guarding.  Musculoskeletal: Normal range of motion. He exhibits no edema or tenderness.  Lymphadenopathy:    He has no cervical adenopathy.  Neurological: He is alert and oriented to person, place, and time. He has normal reflexes. He displays normal reflexes. No cranial nerve deficit. He exhibits normal muscle tone. Coordination normal.  Skin: Skin is warm. No rash noted. He is not diaphoretic. No erythema. No pallor.  Psychiatric: He has a normal mood and affect. His behavior is normal. Judgment and thought content normal.  Vitals reviewed.   Declines DRE      Assessment & Plan:  Diabetes mellitus, type II, insulin dependent ([place]) - Plan: Ambulatory referral to Ophthalmology  CKD (chronic kidney disease) stage V requiring chronic dialysis ([place])  Hyperlipidemia  Routine general medical examination at a health care facility - Plan: Hepatitis C Ab Reflex HCV RNA, QUANT, PSA  CHF (congestive heart failure), NYHA class IV, unspecified failure chronicity, systolic ([facility])  Acute idiopathic gout, unspecified site  Blood pressure significantly elevated today on exam but the vast majority of his blood pressures are actually low. Patient is very intelligent and checks his blood pressure on a daily basis. I have asked the patient to notify me if his blood pressures consistently greater than 130/80. If so I would increase the frequency at which she takes hydralazine. However I would not increase the medication for an isolated elevation in his blood pressure. I will schedule the patient for a diabetic eye exam. I will also check a PSA. We will screen the patient for hepatitis C. He is not due for fasting lipid panel or a hemoglobin A1c for 2 more months. The remainder of his preventative care is up-to-date. He will verify whether or not he has had a shingles vaccine.

## 2015-07-03 DIAGNOSIS — D631 Anemia in chronic kidney disease: Secondary | ICD-10-CM | POA: Diagnosis not present

## 2015-07-03 DIAGNOSIS — Z23 Encounter for immunization: Secondary | ICD-10-CM | POA: Diagnosis not present

## 2015-07-03 DIAGNOSIS — E1129 Type 2 diabetes mellitus with other diabetic kidney complication: Secondary | ICD-10-CM | POA: Diagnosis not present

## 2015-07-03 DIAGNOSIS — N186 End stage renal disease: Secondary | ICD-10-CM | POA: Diagnosis not present

## 2015-07-03 DIAGNOSIS — D509 Iron deficiency anemia, unspecified: Secondary | ICD-10-CM | POA: Diagnosis not present

## 2015-07-03 DIAGNOSIS — D689 Coagulation defect, unspecified: Secondary | ICD-10-CM | POA: Diagnosis not present

## 2015-07-03 LAB — HEPATITIS C ANTIBODY: HCV Ab: NEGATIVE

## 2015-07-03 LAB — PSA: PSA: 0.5 ng/mL (ref <=4.00)

## 2015-07-06 ENCOUNTER — Encounter: Payer: Self-pay | Admitting: Family Medicine

## 2015-07-06 DIAGNOSIS — N186 End stage renal disease: Secondary | ICD-10-CM | POA: Diagnosis not present

## 2015-07-06 DIAGNOSIS — E1129 Type 2 diabetes mellitus with other diabetic kidney complication: Secondary | ICD-10-CM | POA: Diagnosis not present

## 2015-07-06 DIAGNOSIS — D509 Iron deficiency anemia, unspecified: Secondary | ICD-10-CM | POA: Diagnosis not present

## 2015-07-06 DIAGNOSIS — D631 Anemia in chronic kidney disease: Secondary | ICD-10-CM | POA: Diagnosis not present

## 2015-07-06 DIAGNOSIS — D689 Coagulation defect, unspecified: Secondary | ICD-10-CM | POA: Diagnosis not present

## 2015-07-06 DIAGNOSIS — Z23 Encounter for immunization: Secondary | ICD-10-CM | POA: Diagnosis not present

## 2015-07-08 DIAGNOSIS — D631 Anemia in chronic kidney disease: Secondary | ICD-10-CM | POA: Diagnosis not present

## 2015-07-08 DIAGNOSIS — Z23 Encounter for immunization: Secondary | ICD-10-CM | POA: Diagnosis not present

## 2015-07-08 DIAGNOSIS — E1129 Type 2 diabetes mellitus with other diabetic kidney complication: Secondary | ICD-10-CM | POA: Diagnosis not present

## 2015-07-08 DIAGNOSIS — D689 Coagulation defect, unspecified: Secondary | ICD-10-CM | POA: Diagnosis not present

## 2015-07-08 DIAGNOSIS — D509 Iron deficiency anemia, unspecified: Secondary | ICD-10-CM | POA: Diagnosis not present

## 2015-07-08 DIAGNOSIS — N186 End stage renal disease: Secondary | ICD-10-CM | POA: Diagnosis not present

## 2015-07-10 DIAGNOSIS — D689 Coagulation defect, unspecified: Secondary | ICD-10-CM | POA: Diagnosis not present

## 2015-07-10 DIAGNOSIS — E1129 Type 2 diabetes mellitus with other diabetic kidney complication: Secondary | ICD-10-CM | POA: Diagnosis not present

## 2015-07-10 DIAGNOSIS — D509 Iron deficiency anemia, unspecified: Secondary | ICD-10-CM | POA: Diagnosis not present

## 2015-07-10 DIAGNOSIS — D631 Anemia in chronic kidney disease: Secondary | ICD-10-CM | POA: Diagnosis not present

## 2015-07-10 DIAGNOSIS — N186 End stage renal disease: Secondary | ICD-10-CM | POA: Diagnosis not present

## 2015-07-10 DIAGNOSIS — Z23 Encounter for immunization: Secondary | ICD-10-CM | POA: Diagnosis not present

## 2015-07-13 DIAGNOSIS — Z23 Encounter for immunization: Secondary | ICD-10-CM | POA: Diagnosis not present

## 2015-07-13 DIAGNOSIS — D689 Coagulation defect, unspecified: Secondary | ICD-10-CM | POA: Diagnosis not present

## 2015-07-13 DIAGNOSIS — D631 Anemia in chronic kidney disease: Secondary | ICD-10-CM | POA: Diagnosis not present

## 2015-07-13 DIAGNOSIS — D509 Iron deficiency anemia, unspecified: Secondary | ICD-10-CM | POA: Diagnosis not present

## 2015-07-13 DIAGNOSIS — E1129 Type 2 diabetes mellitus with other diabetic kidney complication: Secondary | ICD-10-CM | POA: Diagnosis not present

## 2015-07-13 DIAGNOSIS — N186 End stage renal disease: Secondary | ICD-10-CM | POA: Diagnosis not present

## 2015-07-15 DIAGNOSIS — N186 End stage renal disease: Secondary | ICD-10-CM | POA: Diagnosis not present

## 2015-07-15 DIAGNOSIS — D689 Coagulation defect, unspecified: Secondary | ICD-10-CM | POA: Diagnosis not present

## 2015-07-15 DIAGNOSIS — E1129 Type 2 diabetes mellitus with other diabetic kidney complication: Secondary | ICD-10-CM | POA: Diagnosis not present

## 2015-07-15 DIAGNOSIS — D509 Iron deficiency anemia, unspecified: Secondary | ICD-10-CM | POA: Diagnosis not present

## 2015-07-15 DIAGNOSIS — D631 Anemia in chronic kidney disease: Secondary | ICD-10-CM | POA: Diagnosis not present

## 2015-07-15 DIAGNOSIS — Z23 Encounter for immunization: Secondary | ICD-10-CM | POA: Diagnosis not present

## 2015-07-17 DIAGNOSIS — D509 Iron deficiency anemia, unspecified: Secondary | ICD-10-CM | POA: Diagnosis not present

## 2015-07-17 DIAGNOSIS — N186 End stage renal disease: Secondary | ICD-10-CM | POA: Diagnosis not present

## 2015-07-17 DIAGNOSIS — Z992 Dependence on renal dialysis: Secondary | ICD-10-CM | POA: Diagnosis not present

## 2015-07-17 DIAGNOSIS — E1129 Type 2 diabetes mellitus with other diabetic kidney complication: Secondary | ICD-10-CM | POA: Diagnosis not present

## 2015-07-17 DIAGNOSIS — Z23 Encounter for immunization: Secondary | ICD-10-CM | POA: Diagnosis not present

## 2015-07-17 DIAGNOSIS — D689 Coagulation defect, unspecified: Secondary | ICD-10-CM | POA: Diagnosis not present

## 2015-07-17 DIAGNOSIS — I120 Hypertensive chronic kidney disease with stage 5 chronic kidney disease or end stage renal disease: Secondary | ICD-10-CM | POA: Diagnosis not present

## 2015-07-17 DIAGNOSIS — D631 Anemia in chronic kidney disease: Secondary | ICD-10-CM | POA: Diagnosis not present

## 2015-07-20 DIAGNOSIS — E1129 Type 2 diabetes mellitus with other diabetic kidney complication: Secondary | ICD-10-CM | POA: Diagnosis not present

## 2015-07-20 DIAGNOSIS — D509 Iron deficiency anemia, unspecified: Secondary | ICD-10-CM | POA: Diagnosis not present

## 2015-07-20 DIAGNOSIS — N186 End stage renal disease: Secondary | ICD-10-CM | POA: Diagnosis not present

## 2015-07-20 DIAGNOSIS — D689 Coagulation defect, unspecified: Secondary | ICD-10-CM | POA: Diagnosis not present

## 2015-07-20 DIAGNOSIS — D631 Anemia in chronic kidney disease: Secondary | ICD-10-CM | POA: Diagnosis not present

## 2015-07-22 DIAGNOSIS — N186 End stage renal disease: Secondary | ICD-10-CM | POA: Diagnosis not present

## 2015-07-22 DIAGNOSIS — D509 Iron deficiency anemia, unspecified: Secondary | ICD-10-CM | POA: Diagnosis not present

## 2015-07-22 DIAGNOSIS — D631 Anemia in chronic kidney disease: Secondary | ICD-10-CM | POA: Diagnosis not present

## 2015-07-22 DIAGNOSIS — D689 Coagulation defect, unspecified: Secondary | ICD-10-CM | POA: Diagnosis not present

## 2015-07-22 DIAGNOSIS — E1129 Type 2 diabetes mellitus with other diabetic kidney complication: Secondary | ICD-10-CM | POA: Diagnosis not present

## 2015-07-23 ENCOUNTER — Telehealth: Payer: Self-pay | Admitting: Internal Medicine

## 2015-07-23 NOTE — Telephone Encounter (Signed)
New message:   Please call,concerning letter he received from [place] he needs to give his license up.

## 2015-07-23 NOTE — Telephone Encounter (Signed)
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 Patient understands that. He does say that they are revoking entire license and not just dropping him to regular class C.  Informed patient that he will need to discuss reasoning with the DMV. He is agreeable and thanks me for calling to let him know.

## 2015-07-23 NOTE — Telephone Encounter (Signed)
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  Pt states license will be revoked on 08/17/15 if something is not sent to DOT so that he can keep his CDL's. Pt wanted to call our office first and see if it was because we sent something to their office.  Advised pt that I do not see where we have sent anything.  Advised pt to speak to DOT and find out exactly what was sent to them to provoke them to send this letter and what they needed from our office in regards to him keeping his CDLs.  Pt verbalized understanding and was in agreement with this plan.

## 2015-07-24 DIAGNOSIS — D689 Coagulation defect, unspecified: Secondary | ICD-10-CM | POA: Diagnosis not present

## 2015-07-24 DIAGNOSIS — E1129 Type 2 diabetes mellitus with other diabetic kidney complication: Secondary | ICD-10-CM | POA: Diagnosis not present

## 2015-07-24 DIAGNOSIS — D509 Iron deficiency anemia, unspecified: Secondary | ICD-10-CM | POA: Diagnosis not present

## 2015-07-24 DIAGNOSIS — N186 End stage renal disease: Secondary | ICD-10-CM | POA: Diagnosis not present

## 2015-07-24 DIAGNOSIS — D631 Anemia in chronic kidney disease: Secondary | ICD-10-CM | POA: Diagnosis not present

## 2015-07-27 DIAGNOSIS — D509 Iron deficiency anemia, unspecified: Secondary | ICD-10-CM | POA: Diagnosis not present

## 2015-07-27 DIAGNOSIS — E1129 Type 2 diabetes mellitus with other diabetic kidney complication: Secondary | ICD-10-CM | POA: Diagnosis not present

## 2015-07-27 DIAGNOSIS — D631 Anemia in chronic kidney disease: Secondary | ICD-10-CM | POA: Diagnosis not present

## 2015-07-27 DIAGNOSIS — N186 End stage renal disease: Secondary | ICD-10-CM | POA: Diagnosis not present

## 2015-07-27 DIAGNOSIS — D689 Coagulation defect, unspecified: Secondary | ICD-10-CM | POA: Diagnosis not present

## 2015-07-29 DIAGNOSIS — N186 End stage renal disease: Secondary | ICD-10-CM | POA: Diagnosis not present

## 2015-07-29 DIAGNOSIS — D689 Coagulation defect, unspecified: Secondary | ICD-10-CM | POA: Diagnosis not present

## 2015-07-29 DIAGNOSIS — D509 Iron deficiency anemia, unspecified: Secondary | ICD-10-CM | POA: Diagnosis not present

## 2015-07-29 DIAGNOSIS — D631 Anemia in chronic kidney disease: Secondary | ICD-10-CM | POA: Diagnosis not present

## 2015-07-29 DIAGNOSIS — E1129 Type 2 diabetes mellitus with other diabetic kidney complication: Secondary | ICD-10-CM | POA: Diagnosis not present

## 2015-07-31 DIAGNOSIS — N186 End stage renal disease: Secondary | ICD-10-CM | POA: Diagnosis not present

## 2015-07-31 DIAGNOSIS — D631 Anemia in chronic kidney disease: Secondary | ICD-10-CM | POA: Diagnosis not present

## 2015-07-31 DIAGNOSIS — D689 Coagulation defect, unspecified: Secondary | ICD-10-CM | POA: Diagnosis not present

## 2015-07-31 DIAGNOSIS — D509 Iron deficiency anemia, unspecified: Secondary | ICD-10-CM | POA: Diagnosis not present

## 2015-07-31 DIAGNOSIS — E1129 Type 2 diabetes mellitus with other diabetic kidney complication: Secondary | ICD-10-CM | POA: Diagnosis not present

## 2015-08-03 DIAGNOSIS — D689 Coagulation defect, unspecified: Secondary | ICD-10-CM | POA: Diagnosis not present

## 2015-08-03 DIAGNOSIS — D509 Iron deficiency anemia, unspecified: Secondary | ICD-10-CM | POA: Diagnosis not present

## 2015-08-03 DIAGNOSIS — N186 End stage renal disease: Secondary | ICD-10-CM | POA: Diagnosis not present

## 2015-08-03 DIAGNOSIS — E1129 Type 2 diabetes mellitus with other diabetic kidney complication: Secondary | ICD-10-CM | POA: Diagnosis not present

## 2015-08-03 DIAGNOSIS — D631 Anemia in chronic kidney disease: Secondary | ICD-10-CM | POA: Diagnosis not present

## 2015-08-05 DIAGNOSIS — D689 Coagulation defect, unspecified: Secondary | ICD-10-CM | POA: Diagnosis not present

## 2015-08-05 DIAGNOSIS — D509 Iron deficiency anemia, unspecified: Secondary | ICD-10-CM | POA: Diagnosis not present

## 2015-08-05 DIAGNOSIS — E1129 Type 2 diabetes mellitus with other diabetic kidney complication: Secondary | ICD-10-CM | POA: Diagnosis not present

## 2015-08-05 DIAGNOSIS — N186 End stage renal disease: Secondary | ICD-10-CM | POA: Diagnosis not present

## 2015-08-05 DIAGNOSIS — D631 Anemia in chronic kidney disease: Secondary | ICD-10-CM | POA: Diagnosis not present

## 2015-08-07 DIAGNOSIS — D631 Anemia in chronic kidney disease: Secondary | ICD-10-CM | POA: Diagnosis not present

## 2015-08-07 DIAGNOSIS — N186 End stage renal disease: Secondary | ICD-10-CM | POA: Diagnosis not present

## 2015-08-07 DIAGNOSIS — E1129 Type 2 diabetes mellitus with other diabetic kidney complication: Secondary | ICD-10-CM | POA: Diagnosis not present

## 2015-08-07 DIAGNOSIS — D509 Iron deficiency anemia, unspecified: Secondary | ICD-10-CM | POA: Diagnosis not present

## 2015-08-07 DIAGNOSIS — D689 Coagulation defect, unspecified: Secondary | ICD-10-CM | POA: Diagnosis not present

## 2015-08-10 DIAGNOSIS — D631 Anemia in chronic kidney disease: Secondary | ICD-10-CM | POA: Diagnosis not present

## 2015-08-10 DIAGNOSIS — D689 Coagulation defect, unspecified: Secondary | ICD-10-CM | POA: Diagnosis not present

## 2015-08-10 DIAGNOSIS — E1129 Type 2 diabetes mellitus with other diabetic kidney complication: Secondary | ICD-10-CM | POA: Diagnosis not present

## 2015-08-10 DIAGNOSIS — N186 End stage renal disease: Secondary | ICD-10-CM | POA: Diagnosis not present

## 2015-08-10 DIAGNOSIS — D509 Iron deficiency anemia, unspecified: Secondary | ICD-10-CM | POA: Diagnosis not present

## 2015-08-12 DIAGNOSIS — D689 Coagulation defect, unspecified: Secondary | ICD-10-CM | POA: Diagnosis not present

## 2015-08-12 DIAGNOSIS — D631 Anemia in chronic kidney disease: Secondary | ICD-10-CM | POA: Diagnosis not present

## 2015-08-12 DIAGNOSIS — D509 Iron deficiency anemia, unspecified: Secondary | ICD-10-CM | POA: Diagnosis not present

## 2015-08-12 DIAGNOSIS — E1129 Type 2 diabetes mellitus with other diabetic kidney complication: Secondary | ICD-10-CM | POA: Diagnosis not present

## 2015-08-12 DIAGNOSIS — N186 End stage renal disease: Secondary | ICD-10-CM | POA: Diagnosis not present

## 2015-08-13 ENCOUNTER — Encounter: Payer: Self-pay | Admitting: Family Medicine

## 2015-08-13 ENCOUNTER — Ambulatory Visit (INDEPENDENT_AMBULATORY_CARE_PROVIDER_SITE_OTHER): Payer: Medicare Other | Admitting: Family Medicine

## 2015-08-13 VITALS — BP 136/44 | HR 70 | Temp 98.1°F | Resp 16 | Ht 71.0 in | Wt 184.0 lb

## 2015-08-13 DIAGNOSIS — M25562 Pain in left knee: Secondary | ICD-10-CM

## 2015-08-13 MED ORDER — INSULIN GLARGINE 100 UNIT/ML SOLOSTAR PEN: 30.0000 [IU] | PEN_INJECTOR | Freq: Every day | SUBCUTANEOUS | 99 refills | Status: DC

## 2015-08-13 MED ORDER — TRAMADOL HCL 50 MG PO TABS: 100.0000 mg | ORAL_TABLET | Freq: Three times a day (TID) | ORAL | 0 refills | Status: DC | PRN

## 2015-08-13 NOTE — Progress Notes (Signed)
Subjective:    Patient ID: [name], male    DOB: 02-28-1945, 70 y.o.   MRN: [id number]  HPI  I have seen the patient several times for knee pain. He is here today again complaining of left-sided knee pain and swelling.  However patient also seems to have a migratory arthritis. He will have pain in his right knee. The next day he will have pain in his left knee. The next day pain in his ankle. The pain can be severe at times. He is unable to take NSAIDs because of his chronic kidney disease dialysis dependent. Tylenol is not sufficient to manage the pain. He is requesting something that he can take on an as-needed basis to help manage the pain maybe a few times a week. We have checked his uric acid level and is less than 6 side out driving at lower will help his pain. He also states that his blood sugars have increased dramatically since stopping his insulin. He is now getting fasting blood sugars 170 with occasional 2 hour postprandial sugars as high as 300. I recommended starting back Lantus 7-10 units every day. I will write a prescription for 30 units subcutaneous get more for his co-pay but he knows to only take 7 units and he starts to experience hypoglycemia to discontinue the insulin altogether  Past Medical History:  Diagnosis Date  . Adenomatous colon polyp 2006  . AICD (automatic cardioverter/defibrillator) present    STJ device, implanted 12/03/14 Dr. Lovena Le  . Arthritis    Gout  . Benign prostatic hypertrophy   . CHF (congestive heart failure) ([place])   . Chronic combined systolic and diastolic CHF (congestive heart failure) ([place])    a. 02/2014 Echo: EF 20-25% (new), Gr 2 DD, mod conc LVH, mild AI/AS, mod MR, sev dil LA, mild TR.  Marland Kitchen Chronic kidney disease (CKD), stage IV (severe) ([place]) 12/2009   a. baseline creat now ~ 4.  . Colonic polyp   . Constipation   . 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 Erectile dysfunction   . Gout   . H. pylori  infection 2013   treated with prevpac  . Hyperlipidemia   . Hyperparathyroidism ([place])   . Hypertension   . Low serum testosterone level   . Mild Ao Stenosis and Insufficiency   . Nonischemic cardiomyopathy ([place])    a. 02/2014 Echo: EF 20-25%;  b. 03/2014 Cath: LM nl, LAD min irregs, LCX   . Obesity, Class I, BMI 30-34.9   . Shortness of breath dyspnea   . Sleep apnea 2010   does not use CPAP  . Tobacco abuse    Past Surgical History:  Procedure Laterality Date  . AV FISTULA PLACEMENT Right 04/02/2015   Procedure: RIGHT ARM RADIOCEPHALIC ARTERIOVENOUS (AV) FISTULA CREATION;  Surgeon: Conrad [place]ike Creek, MD;  Location: [place];  Service: Vascular;  Laterality: Right;  . CARDIAC CATHETERIZATION    . CHOLECYSTECTOMY N/A 01/07/2015   Procedure: LAPAROSCOPIC CHOLECYSTECTOMY WITH INTRAOPERATIVE CHOLANGIOGRAM;  Surgeon: Donnie Mesa, MD;  Location: [place];  Service: General;  Laterality: N/A;  . COLONOSCOPY  08/23/2004   Dr. Vivi Ferns rectum, diminutive polyp of the rectosigmoid removed, inflamed focally adenomatous polyp  . COLONOSCOPY  2013   Dr. Gala Romney: multiple diminutive polyps in distal sigmoid segment, 4 mm pedunculated polyp at hepatic flexure. Tubular adenomas. Surveillance due 2018   . EP IMPLANTABLE DEVICE N/A 12/03/2014   Procedure: ICD Implant;  Surgeon:  Evans Lance, MD;  Location: [place] [facility];  Service: Cardiovascular;  Laterality: N/A;; St Jude  . ERCP N/A 01/09/2015   Procedure: ENDOSCOPIC RETROGRADE CHOLANGIOPANCREATOGRAPHY (ERCP);  Surgeon: Clarene Essex, MD;  Location[facility] ENDOSCOPY;  Service: Endoscopy;  Laterality: N/A;  . ESOPHAGOGASTRODUODENOSCOPY  2013   Dr. Gala Romney: erosions reminiscent of GAVE but biopsies showed H.pylori gastritis  . INSERTION OF DIALYSIS CATHETER Right 04/02/2015   Procedure: INSERTION OF DIALYSIS CATHETER - RIGHT INTERNAL JUGULAR;  Surgeon: Conrad Ashville, MD;  Location: [place];  Service: Vascular;  Laterality: Right;  . LEFT AND RIGHT HEART  CATHETERIZATION WITH CORONARY ANGIOGRAM N/A 03/19/2014   Procedure: LEFT AND RIGHT HEART CATHETERIZATION WITH CORONARY ANGIOGRAM;  Surgeon: Blane Ohara, MD;  Location: [facility] CATH LAB;  Service: Cardiovascular;  Laterality: N/A;  . LYMPH NODE BIOPSY     surgical exploration of neck-not entirely clear that this represented a lymph node biopsy  . RETINAL LASER PROCEDURE     diabetic retinopathy  . UMBILICAL HERNIA REPAIR N/A 01/07/2015   Procedure: HERNIA REPAIR UMBILICAL ADULT;  Surgeon: Donnie Mesa, MD;  Location: [place];  Service: General;  Laterality: N/A;   Current Outpatient Prescriptions on File Prior to Visit  Medication Sig Dispense Refill  . aspirin EC 81 MG tablet Take 81 mg by mouth daily.    . calcium acetate (PHOSLO) 667 MG capsule Take 1 capsule (667 mg total) by mouth daily. Take with the biggest meal. 90 capsule 1  . colchicine 0.6 MG tablet Take 0.6 mg by mouth as needed (gout flare up).     . hydrALAZINE (APRESOLINE) 100 MG tablet Take 1 tablet (100 mg total) by mouth 2 (two) times daily. (Patient taking differently: Take 100 mg by mouth 2 (two) times daily. Per patient taking 100mg   Monday, Wednesday and Friday) 60 tablet 1  . lidocaine-prilocaine (EMLA) cream     . metoprolol succinate (TOPROL-XL) 25 MG 24 hr tablet Take 1 tablet (25 mg total) by mouth daily. (Patient taking differently: Take 50 mg by mouth daily. ) 90 tablet 3  . ONE TOUCH ULTRA TEST test strip      No current facility-administered medications on file prior to visit.    Allergies  Allergen Reactions  . Ace Inhibitors Cough   Social History   Social History  . Marital status: Married    Spouse name: N/A  . Number of children: 4  . Years of education: N/A   Occupational History  . Semi - Retired Administrator    Social History Main Topics  . Smoking status: Current Some Day Smoker    Packs/day: 0.25    Years: 56.00    Types: Cigarettes    Start date: 05/31/1958  . Smokeless tobacco: Never  Used  . Alcohol use No  . Drug use: No  . Sexual activity: Not Currently   Other Topics Concern  . Not on file   Social History Narrative  . No narrative on file     Review of Systems  All other systems reviewed and are negative.      Objective:   Physical Exam  Constitutional: He appears well-developed and well-nourished.  Cardiovascular: Normal rate, regular rhythm and normal heart sounds.   Pulmonary/Chest: Effort normal and breath sounds normal. No respiratory distress. He has no wheezes. He has no rales. He exhibits no tenderness.  Musculoskeletal:       Left knee: He exhibits decreased range of motion, swelling and effusion. Tenderness  found. Medial joint line and lateral joint line tenderness noted.  Vitals reviewed.         Assessment & Plan:  Left anterior knee pain  I believe the patient's knee pain is likely secondary to arthritis plus or minus diabetic neuropathy. Due to his other medical conditions, his options for pain control are limited. Therefore I'll give him a prescription for tramadol 50 mg. He can take 1-2 tablets every 8 hours as needed. I will give him 60 tablets and hopefully that will last him several months.

## 2015-08-14 DIAGNOSIS — E1129 Type 2 diabetes mellitus with other diabetic kidney complication: Secondary | ICD-10-CM | POA: Diagnosis not present

## 2015-08-14 DIAGNOSIS — D509 Iron deficiency anemia, unspecified: Secondary | ICD-10-CM | POA: Diagnosis not present

## 2015-08-14 DIAGNOSIS — N186 End stage renal disease: Secondary | ICD-10-CM | POA: Diagnosis not present

## 2015-08-14 DIAGNOSIS — D631 Anemia in chronic kidney disease: Secondary | ICD-10-CM | POA: Diagnosis not present

## 2015-08-14 DIAGNOSIS — D689 Coagulation defect, unspecified: Secondary | ICD-10-CM | POA: Diagnosis not present

## 2015-08-17 DIAGNOSIS — I120 Hypertensive chronic kidney disease with stage 5 chronic kidney disease or end stage renal disease: Secondary | ICD-10-CM | POA: Diagnosis not present

## 2015-08-17 DIAGNOSIS — N186 End stage renal disease: Secondary | ICD-10-CM | POA: Diagnosis not present

## 2015-08-17 DIAGNOSIS — D509 Iron deficiency anemia, unspecified: Secondary | ICD-10-CM | POA: Diagnosis not present

## 2015-08-17 DIAGNOSIS — D631 Anemia in chronic kidney disease: Secondary | ICD-10-CM | POA: Diagnosis not present

## 2015-08-17 DIAGNOSIS — D689 Coagulation defect, unspecified: Secondary | ICD-10-CM | POA: Diagnosis not present

## 2015-08-17 DIAGNOSIS — Z992 Dependence on renal dialysis: Secondary | ICD-10-CM | POA: Diagnosis not present

## 2015-08-17 DIAGNOSIS — E1129 Type 2 diabetes mellitus with other diabetic kidney complication: Secondary | ICD-10-CM | POA: Diagnosis not present

## 2015-08-19 ENCOUNTER — Other Ambulatory Visit: Payer: Self-pay | Admitting: Family Medicine

## 2015-08-19 DIAGNOSIS — N186 End stage renal disease: Secondary | ICD-10-CM | POA: Diagnosis not present

## 2015-08-19 DIAGNOSIS — D631 Anemia in chronic kidney disease: Secondary | ICD-10-CM | POA: Diagnosis not present

## 2015-08-19 DIAGNOSIS — D689 Coagulation defect, unspecified: Secondary | ICD-10-CM | POA: Diagnosis not present

## 2015-08-19 DIAGNOSIS — E1129 Type 2 diabetes mellitus with other diabetic kidney complication: Secondary | ICD-10-CM | POA: Diagnosis not present

## 2015-08-21 ENCOUNTER — Other Ambulatory Visit: Payer: Self-pay | Admitting: Family Medicine

## 2015-08-21 DIAGNOSIS — E1129 Type 2 diabetes mellitus with other diabetic kidney complication: Secondary | ICD-10-CM | POA: Diagnosis not present

## 2015-08-21 DIAGNOSIS — N186 End stage renal disease: Secondary | ICD-10-CM | POA: Diagnosis not present

## 2015-08-21 DIAGNOSIS — D631 Anemia in chronic kidney disease: Secondary | ICD-10-CM | POA: Diagnosis not present

## 2015-08-21 DIAGNOSIS — D689 Coagulation defect, unspecified: Secondary | ICD-10-CM | POA: Diagnosis not present

## 2015-08-21 NOTE — Telephone Encounter (Signed)
Refill appropriate and filled per protocol. 

## 2015-08-24 DIAGNOSIS — E1129 Type 2 diabetes mellitus with other diabetic kidney complication: Secondary | ICD-10-CM | POA: Diagnosis not present

## 2015-08-24 DIAGNOSIS — D689 Coagulation defect, unspecified: Secondary | ICD-10-CM | POA: Diagnosis not present

## 2015-08-24 DIAGNOSIS — D631 Anemia in chronic kidney disease: Secondary | ICD-10-CM | POA: Diagnosis not present

## 2015-08-24 DIAGNOSIS — N186 End stage renal disease: Secondary | ICD-10-CM | POA: Diagnosis not present

## 2015-08-26 DIAGNOSIS — N186 End stage renal disease: Secondary | ICD-10-CM | POA: Diagnosis not present

## 2015-08-26 DIAGNOSIS — E1129 Type 2 diabetes mellitus with other diabetic kidney complication: Secondary | ICD-10-CM | POA: Diagnosis not present

## 2015-08-26 DIAGNOSIS — D631 Anemia in chronic kidney disease: Secondary | ICD-10-CM | POA: Diagnosis not present

## 2015-08-26 DIAGNOSIS — D689 Coagulation defect, unspecified: Secondary | ICD-10-CM | POA: Diagnosis not present

## 2015-08-28 DIAGNOSIS — D631 Anemia in chronic kidney disease: Secondary | ICD-10-CM | POA: Diagnosis not present

## 2015-08-28 DIAGNOSIS — E1129 Type 2 diabetes mellitus with other diabetic kidney complication: Secondary | ICD-10-CM | POA: Diagnosis not present

## 2015-08-28 DIAGNOSIS — N186 End stage renal disease: Secondary | ICD-10-CM | POA: Diagnosis not present

## 2015-08-28 DIAGNOSIS — D689 Coagulation defect, unspecified: Secondary | ICD-10-CM | POA: Diagnosis not present

## 2015-08-31 DIAGNOSIS — E1129 Type 2 diabetes mellitus with other diabetic kidney complication: Secondary | ICD-10-CM | POA: Diagnosis not present

## 2015-08-31 DIAGNOSIS — D689 Coagulation defect, unspecified: Secondary | ICD-10-CM | POA: Diagnosis not present

## 2015-08-31 DIAGNOSIS — D631 Anemia in chronic kidney disease: Secondary | ICD-10-CM | POA: Diagnosis not present

## 2015-08-31 DIAGNOSIS — N186 End stage renal disease: Secondary | ICD-10-CM | POA: Diagnosis not present

## 2015-09-01 DIAGNOSIS — N186 End stage renal disease: Secondary | ICD-10-CM | POA: Diagnosis not present

## 2015-09-01 DIAGNOSIS — Z992 Dependence on renal dialysis: Secondary | ICD-10-CM | POA: Diagnosis not present

## 2015-09-01 DIAGNOSIS — T82858A Stenosis of vascular prosthetic devices, implants and grafts, initial encounter: Secondary | ICD-10-CM | POA: Diagnosis not present

## 2015-09-01 DIAGNOSIS — I871 Compression of vein: Secondary | ICD-10-CM | POA: Diagnosis not present

## 2015-09-02 DIAGNOSIS — D631 Anemia in chronic kidney disease: Secondary | ICD-10-CM | POA: Diagnosis not present

## 2015-09-02 DIAGNOSIS — N186 End stage renal disease: Secondary | ICD-10-CM | POA: Diagnosis not present

## 2015-09-02 DIAGNOSIS — D689 Coagulation defect, unspecified: Secondary | ICD-10-CM | POA: Diagnosis not present

## 2015-09-02 DIAGNOSIS — E1129 Type 2 diabetes mellitus with other diabetic kidney complication: Secondary | ICD-10-CM | POA: Diagnosis not present

## 2015-09-04 DIAGNOSIS — D631 Anemia in chronic kidney disease: Secondary | ICD-10-CM | POA: Diagnosis not present

## 2015-09-04 DIAGNOSIS — N186 End stage renal disease: Secondary | ICD-10-CM | POA: Diagnosis not present

## 2015-09-04 DIAGNOSIS — D689 Coagulation defect, unspecified: Secondary | ICD-10-CM | POA: Diagnosis not present

## 2015-09-04 DIAGNOSIS — E1129 Type 2 diabetes mellitus with other diabetic kidney complication: Secondary | ICD-10-CM | POA: Diagnosis not present

## 2015-09-07 DIAGNOSIS — D631 Anemia in chronic kidney disease: Secondary | ICD-10-CM | POA: Diagnosis not present

## 2015-09-07 DIAGNOSIS — N186 End stage renal disease: Secondary | ICD-10-CM | POA: Diagnosis not present

## 2015-09-07 DIAGNOSIS — D689 Coagulation defect, unspecified: Secondary | ICD-10-CM | POA: Diagnosis not present

## 2015-09-07 DIAGNOSIS — E1129 Type 2 diabetes mellitus with other diabetic kidney complication: Secondary | ICD-10-CM | POA: Diagnosis not present

## 2015-09-09 DIAGNOSIS — D689 Coagulation defect, unspecified: Secondary | ICD-10-CM | POA: Diagnosis not present

## 2015-09-09 DIAGNOSIS — D631 Anemia in chronic kidney disease: Secondary | ICD-10-CM | POA: Diagnosis not present

## 2015-09-09 DIAGNOSIS — E1129 Type 2 diabetes mellitus with other diabetic kidney complication: Secondary | ICD-10-CM | POA: Diagnosis not present

## 2015-09-09 DIAGNOSIS — N186 End stage renal disease: Secondary | ICD-10-CM | POA: Diagnosis not present

## 2015-09-10 ENCOUNTER — Telehealth: Payer: Self-pay | Admitting: Cardiology

## 2015-09-10 ENCOUNTER — Ambulatory Visit (INDEPENDENT_AMBULATORY_CARE_PROVIDER_SITE_OTHER): Payer: Medicare Other | Admitting: *Deleted

## 2015-09-10 DIAGNOSIS — T82858D Stenosis of vascular prosthetic devices, implants and grafts, subsequent encounter: Secondary | ICD-10-CM | POA: Diagnosis not present

## 2015-09-10 DIAGNOSIS — I871 Compression of vein: Secondary | ICD-10-CM | POA: Diagnosis not present

## 2015-09-10 DIAGNOSIS — I429 Cardiomyopathy, unspecified: Secondary | ICD-10-CM

## 2015-09-10 DIAGNOSIS — Z992 Dependence on renal dialysis: Secondary | ICD-10-CM | POA: Diagnosis not present

## 2015-09-10 DIAGNOSIS — Z95810 Presence of automatic (implantable) cardiac defibrillator: Secondary | ICD-10-CM

## 2015-09-10 DIAGNOSIS — N186 End stage renal disease: Secondary | ICD-10-CM | POA: Diagnosis not present

## 2015-09-10 NOTE — Telephone Encounter (Signed)
LMOVM reminding pt to send remote transmission.   

## 2015-09-11 DIAGNOSIS — N186 End stage renal disease: Secondary | ICD-10-CM | POA: Diagnosis not present

## 2015-09-11 DIAGNOSIS — D689 Coagulation defect, unspecified: Secondary | ICD-10-CM | POA: Diagnosis not present

## 2015-09-11 DIAGNOSIS — D631 Anemia in chronic kidney disease: Secondary | ICD-10-CM | POA: Diagnosis not present

## 2015-09-11 DIAGNOSIS — E1129 Type 2 diabetes mellitus with other diabetic kidney complication: Secondary | ICD-10-CM | POA: Diagnosis not present

## 2015-09-11 NOTE — Progress Notes (Signed)
Remote ICD transmission.   

## 2015-09-14 DIAGNOSIS — N186 End stage renal disease: Secondary | ICD-10-CM | POA: Diagnosis not present

## 2015-09-14 DIAGNOSIS — E1129 Type 2 diabetes mellitus with other diabetic kidney complication: Secondary | ICD-10-CM | POA: Diagnosis not present

## 2015-09-14 DIAGNOSIS — D689 Coagulation defect, unspecified: Secondary | ICD-10-CM | POA: Diagnosis not present

## 2015-09-14 DIAGNOSIS — D631 Anemia in chronic kidney disease: Secondary | ICD-10-CM | POA: Diagnosis not present

## 2015-09-15 LAB — CUP PACEART REMOTE DEVICE CHECK
Battery Remaining Longevity: 90 mo
Battery Remaining Percentage: 87 %
HIGH POWER IMPEDANCE MEASURED VALUE: 78 Ohm
HighPow Impedance: 78 Ohm
Lead Channel Pacing Threshold Amplitude: 0.75 V
Lead Channel Sensing Intrinsic Amplitude: 12 mV
Lead Channel Setting Pacing Amplitude: 2.5 V
Lead Channel Setting Pacing Pulse Width: 0.5 ms
MDC IDC LEAD IMPLANT DT: 20161116
MDC IDC LEAD LOCATION: 753860
MDC IDC LEAD MODEL: 7122
MDC IDC MSMT BATTERY VOLTAGE: 3.04 V
MDC IDC MSMT LEADCHNL RV IMPEDANCE VALUE: 440 Ohm
MDC IDC MSMT LEADCHNL RV PACING THRESHOLD PULSEWIDTH: 0.5 ms
MDC IDC PG SERIAL: 7228612
MDC IDC SESS DTM: 20170825163344
MDC IDC SET LEADCHNL RV SENSING SENSITIVITY: 0.5 mV
MDC IDC STAT BRADY RV PERCENT PACED: 1 %

## 2015-09-16 DIAGNOSIS — E1129 Type 2 diabetes mellitus with other diabetic kidney complication: Secondary | ICD-10-CM | POA: Diagnosis not present

## 2015-09-16 DIAGNOSIS — N186 End stage renal disease: Secondary | ICD-10-CM | POA: Diagnosis not present

## 2015-09-16 DIAGNOSIS — D631 Anemia in chronic kidney disease: Secondary | ICD-10-CM | POA: Diagnosis not present

## 2015-09-16 DIAGNOSIS — D689 Coagulation defect, unspecified: Secondary | ICD-10-CM | POA: Diagnosis not present

## 2015-09-17 ENCOUNTER — Encounter: Payer: Self-pay | Admitting: Cardiology

## 2015-09-17 DIAGNOSIS — Z992 Dependence on renal dialysis: Secondary | ICD-10-CM | POA: Diagnosis not present

## 2015-09-17 DIAGNOSIS — N186 End stage renal disease: Secondary | ICD-10-CM | POA: Diagnosis not present

## 2015-09-17 DIAGNOSIS — I120 Hypertensive chronic kidney disease with stage 5 chronic kidney disease or end stage renal disease: Secondary | ICD-10-CM | POA: Diagnosis not present

## 2015-09-18 DIAGNOSIS — D689 Coagulation defect, unspecified: Secondary | ICD-10-CM | POA: Diagnosis not present

## 2015-09-18 DIAGNOSIS — D631 Anemia in chronic kidney disease: Secondary | ICD-10-CM | POA: Diagnosis not present

## 2015-09-18 DIAGNOSIS — N186 End stage renal disease: Secondary | ICD-10-CM | POA: Diagnosis not present

## 2015-09-18 DIAGNOSIS — Z23 Encounter for immunization: Secondary | ICD-10-CM | POA: Diagnosis not present

## 2015-09-18 DIAGNOSIS — E1129 Type 2 diabetes mellitus with other diabetic kidney complication: Secondary | ICD-10-CM | POA: Diagnosis not present

## 2015-09-21 DIAGNOSIS — E1129 Type 2 diabetes mellitus with other diabetic kidney complication: Secondary | ICD-10-CM | POA: Diagnosis not present

## 2015-09-21 DIAGNOSIS — N186 End stage renal disease: Secondary | ICD-10-CM | POA: Diagnosis not present

## 2015-09-21 DIAGNOSIS — D631 Anemia in chronic kidney disease: Secondary | ICD-10-CM | POA: Diagnosis not present

## 2015-09-21 DIAGNOSIS — Z23 Encounter for immunization: Secondary | ICD-10-CM | POA: Diagnosis not present

## 2015-09-21 DIAGNOSIS — D689 Coagulation defect, unspecified: Secondary | ICD-10-CM | POA: Diagnosis not present

## 2015-09-23 DIAGNOSIS — N186 End stage renal disease: Secondary | ICD-10-CM | POA: Diagnosis not present

## 2015-09-23 DIAGNOSIS — D689 Coagulation defect, unspecified: Secondary | ICD-10-CM | POA: Diagnosis not present

## 2015-09-23 DIAGNOSIS — E1129 Type 2 diabetes mellitus with other diabetic kidney complication: Secondary | ICD-10-CM | POA: Diagnosis not present

## 2015-09-23 DIAGNOSIS — D631 Anemia in chronic kidney disease: Secondary | ICD-10-CM | POA: Diagnosis not present

## 2015-09-23 DIAGNOSIS — Z23 Encounter for immunization: Secondary | ICD-10-CM | POA: Diagnosis not present

## 2015-09-24 ENCOUNTER — Other Ambulatory Visit: Payer: Self-pay | Admitting: Family Medicine

## 2015-09-24 MED ORDER — INSULIN GLARGINE 100 UNIT/ML SOLOSTAR PEN: 60.0000 [IU] | PEN_INJECTOR | Freq: Every day | SUBCUTANEOUS | 99 refills | Status: DC

## 2015-09-25 DIAGNOSIS — D631 Anemia in chronic kidney disease: Secondary | ICD-10-CM | POA: Diagnosis not present

## 2015-09-25 DIAGNOSIS — D689 Coagulation defect, unspecified: Secondary | ICD-10-CM | POA: Diagnosis not present

## 2015-09-25 DIAGNOSIS — N186 End stage renal disease: Secondary | ICD-10-CM | POA: Diagnosis not present

## 2015-09-25 DIAGNOSIS — Z23 Encounter for immunization: Secondary | ICD-10-CM | POA: Diagnosis not present

## 2015-09-25 DIAGNOSIS — E1129 Type 2 diabetes mellitus with other diabetic kidney complication: Secondary | ICD-10-CM | POA: Diagnosis not present

## 2015-09-28 DIAGNOSIS — D689 Coagulation defect, unspecified: Secondary | ICD-10-CM | POA: Diagnosis not present

## 2015-09-28 DIAGNOSIS — D631 Anemia in chronic kidney disease: Secondary | ICD-10-CM | POA: Diagnosis not present

## 2015-09-28 DIAGNOSIS — E1129 Type 2 diabetes mellitus with other diabetic kidney complication: Secondary | ICD-10-CM | POA: Diagnosis not present

## 2015-09-28 DIAGNOSIS — N186 End stage renal disease: Secondary | ICD-10-CM | POA: Diagnosis not present

## 2015-09-28 DIAGNOSIS — Z23 Encounter for immunization: Secondary | ICD-10-CM | POA: Diagnosis not present

## 2015-09-29 DIAGNOSIS — N186 End stage renal disease: Secondary | ICD-10-CM | POA: Diagnosis not present

## 2015-09-29 DIAGNOSIS — I871 Compression of vein: Secondary | ICD-10-CM | POA: Diagnosis not present

## 2015-09-29 DIAGNOSIS — Z992 Dependence on renal dialysis: Secondary | ICD-10-CM | POA: Diagnosis not present

## 2015-09-29 DIAGNOSIS — T82858D Stenosis of vascular prosthetic devices, implants and grafts, subsequent encounter: Secondary | ICD-10-CM | POA: Diagnosis not present

## 2015-09-30 DIAGNOSIS — D689 Coagulation defect, unspecified: Secondary | ICD-10-CM | POA: Diagnosis not present

## 2015-09-30 DIAGNOSIS — Z23 Encounter for immunization: Secondary | ICD-10-CM | POA: Diagnosis not present

## 2015-09-30 DIAGNOSIS — E1129 Type 2 diabetes mellitus with other diabetic kidney complication: Secondary | ICD-10-CM | POA: Diagnosis not present

## 2015-09-30 DIAGNOSIS — D631 Anemia in chronic kidney disease: Secondary | ICD-10-CM | POA: Diagnosis not present

## 2015-09-30 DIAGNOSIS — N186 End stage renal disease: Secondary | ICD-10-CM | POA: Diagnosis not present

## 2015-10-02 ENCOUNTER — Ambulatory Visit (INDEPENDENT_AMBULATORY_CARE_PROVIDER_SITE_OTHER): Payer: Medicare Other | Admitting: Family Medicine

## 2015-10-02 ENCOUNTER — Encounter: Payer: Self-pay | Admitting: Family Medicine

## 2015-10-02 VITALS — BP 150/38 | HR 68 | Temp 98.2°F | Resp 16 | Ht 71.0 in | Wt 186.0 lb

## 2015-10-02 DIAGNOSIS — M1000 Idiopathic gout, unspecified site: Secondary | ICD-10-CM | POA: Diagnosis not present

## 2015-10-02 DIAGNOSIS — E1129 Type 2 diabetes mellitus with other diabetic kidney complication: Secondary | ICD-10-CM | POA: Diagnosis not present

## 2015-10-02 DIAGNOSIS — Z23 Encounter for immunization: Secondary | ICD-10-CM | POA: Diagnosis not present

## 2015-10-02 DIAGNOSIS — D689 Coagulation defect, unspecified: Secondary | ICD-10-CM | POA: Diagnosis not present

## 2015-10-02 DIAGNOSIS — N186 End stage renal disease: Secondary | ICD-10-CM | POA: Diagnosis not present

## 2015-10-02 DIAGNOSIS — D631 Anemia in chronic kidney disease: Secondary | ICD-10-CM | POA: Diagnosis not present

## 2015-10-02 MED ORDER — PREDNISONE 20 MG PO TABS: ORAL_TABLET | ORAL | 0 refills | Status: DC

## 2015-10-02 MED ORDER — TRAMADOL HCL 50 MG PO TABS: 100.0000 mg | ORAL_TABLET | Freq: Three times a day (TID) | ORAL | 0 refills | Status: DC | PRN

## 2015-10-02 NOTE — Progress Notes (Signed)
Subjective:    Patient ID: [name], male    DOB: 1945-01-19, 70 y.o.   MRN: [id number]  HPI Patient recently had a gout flare that started in his left knee and also affected his left ankle. Left knee is better but the left ankle is still erythematous, swollen, painful, and warm to the touch. He is unable to take NSAIDs due to his kidney disease. He cannot afford colchicine. He denies any injury. Past Medical History:  Diagnosis Date  . Adenomatous colon polyp 2006  . AICD (automatic cardioverter/defibrillator) present    STJ device, implanted 12/03/14 Dr. Lovena Le  . Arthritis    Gout  . Benign prostatic hypertrophy   . CHF (congestive heart failure) ([place])   . Chronic combined systolic and diastolic CHF (congestive heart failure) ([place])    a. 02/2014 Echo: EF 20-25% (new), Gr 2 DD, mod conc LVH, mild AI/AS, mod MR, sev dil LA, mild TR.  Marland Kitchen Chronic kidney disease (CKD), stage IV (severe) ([place]) 12/2009   a. baseline creat now ~ 4.  . Colonic polyp   . Constipation   . 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 Erectile dysfunction   . Gout   . H. pylori infection 2013   treated with prevpac  . Hyperlipidemia   . Hyperparathyroidism ([place])   . Hypertension   . Low serum testosterone level   . Mild Ao Stenosis and Insufficiency   . Nonischemic cardiomyopathy ([place])    a. 02/2014 Echo: EF 20-25%;  b. 03/2014 Cath: LM nl, LAD min irregs, LCX   . Obesity, Class I, BMI 30-34.9   . Shortness of breath dyspnea   . Sleep apnea 2010   does not use CPAP  . Tobacco abuse    Past Surgical History:  Procedure Laterality Date  . AV FISTULA PLACEMENT Right 04/02/2015   Procedure: RIGHT ARM RADIOCEPHALIC ARTERIOVENOUS (AV) FISTULA CREATION;  Surgeon: Conrad Wikieup, MD;  Location: [place];  Service: Vascular;  Laterality: Right;  . CARDIAC CATHETERIZATION    . CHOLECYSTECTOMY N/A 01/07/2015   Procedure: LAPAROSCOPIC CHOLECYSTECTOMY WITH INTRAOPERATIVE CHOLANGIOGRAM;   Surgeon: Donnie Mesa, MD;  Location: [place];  Service: General;  Laterality: N/A;  . COLONOSCOPY  08/23/2004   Dr. Vivi Ferns rectum, diminutive polyp of the rectosigmoid removed, inflamed focally adenomatous polyp  . COLONOSCOPY  2013   Dr. Gala Romney: multiple diminutive polyps in distal sigmoid segment, 4 mm pedunculated polyp at hepatic flexure. Tubular adenomas. Surveillance due 2018   . EP IMPLANTABLE DEVICE N/A 12/03/2014   Procedure: ICD Implant;  Surgeon: Evans Lance, MD;  Location: [place] [facility];  Service: Cardiovascular;  Laterality: N/A;; St Jude  . ERCP N/A 01/09/2015   Procedure: ENDOSCOPIC RETROGRADE CHOLANGIOPANCREATOGRAPHY (ERCP);  Surgeon: Clarene Essex, MD;  Location[facility] ENDOSCOPY;  Service: Endoscopy;  Laterality: N/A;  . ESOPHAGOGASTRODUODENOSCOPY  2013   Dr. Gala Romney: erosions reminiscent of GAVE but biopsies showed H.pylori gastritis  . INSERTION OF DIALYSIS CATHETER Right 04/02/2015   Procedure: INSERTION OF DIALYSIS CATHETER - RIGHT INTERNAL JUGULAR;  Surgeon: Conrad Stryker, MD;  Location: [place];  Service: Vascular;  Laterality: Right;  . LEFT AND RIGHT HEART CATHETERIZATION WITH CORONARY ANGIOGRAM N/A 03/19/2014   Procedure: LEFT AND RIGHT HEART CATHETERIZATION WITH CORONARY ANGIOGRAM;  Surgeon: Blane Ohara, MD;  Location: [facility] CATH LAB;  Service: Cardiovascular;  Laterality: N/A;  . LYMPH NODE BIOPSY     surgical exploration of neck-not entirely  clear that this represented a lymph node biopsy  . RETINAL LASER PROCEDURE     diabetic retinopathy  . UMBILICAL HERNIA REPAIR N/A 01/07/2015   Procedure: HERNIA REPAIR UMBILICAL ADULT;  Surgeon: Donnie Mesa, MD;  Location: [place];  Service: General;  Laterality: N/A;   Current Outpatient Prescriptions on File Prior to Visit  Medication Sig Dispense Refill  . aspirin EC 81 MG tablet Take 81 mg by mouth daily.    . calcium acetate (PHOSLO) 667 MG capsule Take 1 capsule (667 mg total) by mouth daily. Take with the biggest meal.  90 capsule 1  . Colchicine 0.6 MG CAPS Take 2 tabs at first sign of gout and repeat in 1 hour if pain persist s 30 capsule 0  . colchicine 0.6 MG tablet Take 0.6 mg by mouth as needed (gout flare up).     . hydrALAZINE (APRESOLINE) 100 MG tablet Take 1 tablet (100 mg total) by mouth 2 (two) times daily. (Patient taking differently: Take 100 mg by mouth 2 (two) times daily. Per patient taking 100mg   Monday, Wednesday and Friday) 60 tablet 1  . Insulin Glargine (LANTUS SOLOSTAR) 100 UNIT/ML Solostar Pen Inject 60 Units into the skin daily at 10 pm. 10 pen PRN  . lidocaine-prilocaine (EMLA) cream     . metoprolol succinate (TOPROL-XL) 25 MG 24 hr tablet Take 1 tablet (25 mg total) by mouth daily. (Patient taking differently: Take 50 mg by mouth daily. ) 90 tablet 3  . ONE TOUCH ULTRA TEST test strip      No current facility-administered medications on file prior to visit.    Allergies  Allergen Reactions  . Ace Inhibitors Cough   Social History   Social History  . Marital status: Married    Spouse name: N/A  . Number of children: 4  . Years of education: N/A   Occupational History  . Semi - Retired Administrator    Social History Main Topics  . Smoking status: Current Some Day Smoker    Packs/day: 0.25    Years: 56.00    Types: Cigarettes    Start date: 05/31/1958  . Smokeless tobacco: Never Used  . Alcohol use No  . Drug use: No  . Sexual activity: Not Currently   Other Topics Concern  . Not on file   Social History Narrative  . No narrative on file      Review of Systems  All other systems reviewed and are negative.      Objective:   Physical Exam  Cardiovascular: Normal rate, regular rhythm and normal heart sounds.   Pulmonary/Chest: Effort normal and breath sounds normal.  Musculoskeletal:       Left ankle: He exhibits decreased range of motion and swelling. He exhibits no ecchymosis, no deformity, no laceration and normal pulse.  Vitals reviewed.           Assessment & Plan:  Acute idiopathic gout, unspecified site - Plan: predniSONE (DELTASONE) 20 MG tablet, traMADol (ULTRAM) 50 MG tablet  Begin prednisone taper pack for gout exacerbation. Use tramadol 50 mg every 6 hours as needed for pain

## 2015-10-05 DIAGNOSIS — N186 End stage renal disease: Secondary | ICD-10-CM | POA: Diagnosis not present

## 2015-10-05 DIAGNOSIS — D631 Anemia in chronic kidney disease: Secondary | ICD-10-CM | POA: Diagnosis not present

## 2015-10-05 DIAGNOSIS — D689 Coagulation defect, unspecified: Secondary | ICD-10-CM | POA: Diagnosis not present

## 2015-10-05 DIAGNOSIS — E1129 Type 2 diabetes mellitus with other diabetic kidney complication: Secondary | ICD-10-CM | POA: Diagnosis not present

## 2015-10-05 DIAGNOSIS — Z23 Encounter for immunization: Secondary | ICD-10-CM | POA: Diagnosis not present

## 2015-10-07 DIAGNOSIS — E1129 Type 2 diabetes mellitus with other diabetic kidney complication: Secondary | ICD-10-CM | POA: Diagnosis not present

## 2015-10-07 DIAGNOSIS — Z23 Encounter for immunization: Secondary | ICD-10-CM | POA: Diagnosis not present

## 2015-10-07 DIAGNOSIS — N186 End stage renal disease: Secondary | ICD-10-CM | POA: Diagnosis not present

## 2015-10-07 DIAGNOSIS — D689 Coagulation defect, unspecified: Secondary | ICD-10-CM | POA: Diagnosis not present

## 2015-10-07 DIAGNOSIS — D631 Anemia in chronic kidney disease: Secondary | ICD-10-CM | POA: Diagnosis not present

## 2015-10-09 DIAGNOSIS — N186 End stage renal disease: Secondary | ICD-10-CM | POA: Diagnosis not present

## 2015-10-09 DIAGNOSIS — D689 Coagulation defect, unspecified: Secondary | ICD-10-CM | POA: Diagnosis not present

## 2015-10-09 DIAGNOSIS — D631 Anemia in chronic kidney disease: Secondary | ICD-10-CM | POA: Diagnosis not present

## 2015-10-09 DIAGNOSIS — E1129 Type 2 diabetes mellitus with other diabetic kidney complication: Secondary | ICD-10-CM | POA: Diagnosis not present

## 2015-10-09 DIAGNOSIS — Z23 Encounter for immunization: Secondary | ICD-10-CM | POA: Diagnosis not present

## 2015-10-12 DIAGNOSIS — D689 Coagulation defect, unspecified: Secondary | ICD-10-CM | POA: Diagnosis not present

## 2015-10-12 DIAGNOSIS — E1129 Type 2 diabetes mellitus with other diabetic kidney complication: Secondary | ICD-10-CM | POA: Diagnosis not present

## 2015-10-12 DIAGNOSIS — N186 End stage renal disease: Secondary | ICD-10-CM | POA: Diagnosis not present

## 2015-10-12 DIAGNOSIS — D631 Anemia in chronic kidney disease: Secondary | ICD-10-CM | POA: Diagnosis not present

## 2015-10-12 DIAGNOSIS — Z23 Encounter for immunization: Secondary | ICD-10-CM | POA: Diagnosis not present

## 2015-10-14 DIAGNOSIS — D689 Coagulation defect, unspecified: Secondary | ICD-10-CM | POA: Diagnosis not present

## 2015-10-14 DIAGNOSIS — E1129 Type 2 diabetes mellitus with other diabetic kidney complication: Secondary | ICD-10-CM | POA: Diagnosis not present

## 2015-10-14 DIAGNOSIS — D631 Anemia in chronic kidney disease: Secondary | ICD-10-CM | POA: Diagnosis not present

## 2015-10-14 DIAGNOSIS — Z23 Encounter for immunization: Secondary | ICD-10-CM | POA: Diagnosis not present

## 2015-10-14 DIAGNOSIS — N186 End stage renal disease: Secondary | ICD-10-CM | POA: Diagnosis not present

## 2015-10-16 DIAGNOSIS — N186 End stage renal disease: Secondary | ICD-10-CM | POA: Diagnosis not present

## 2015-10-16 DIAGNOSIS — Z23 Encounter for immunization: Secondary | ICD-10-CM | POA: Diagnosis not present

## 2015-10-16 DIAGNOSIS — D689 Coagulation defect, unspecified: Secondary | ICD-10-CM | POA: Diagnosis not present

## 2015-10-16 DIAGNOSIS — E1129 Type 2 diabetes mellitus with other diabetic kidney complication: Secondary | ICD-10-CM | POA: Diagnosis not present

## 2015-10-16 DIAGNOSIS — D631 Anemia in chronic kidney disease: Secondary | ICD-10-CM | POA: Diagnosis not present

## 2015-10-17 DIAGNOSIS — N186 End stage renal disease: Secondary | ICD-10-CM | POA: Diagnosis not present

## 2015-10-17 DIAGNOSIS — Z992 Dependence on renal dialysis: Secondary | ICD-10-CM | POA: Diagnosis not present

## 2015-10-17 DIAGNOSIS — I120 Hypertensive chronic kidney disease with stage 5 chronic kidney disease or end stage renal disease: Secondary | ICD-10-CM | POA: Diagnosis not present

## 2015-10-19 DIAGNOSIS — Z23 Encounter for immunization: Secondary | ICD-10-CM | POA: Diagnosis not present

## 2015-10-19 DIAGNOSIS — D689 Coagulation defect, unspecified: Secondary | ICD-10-CM | POA: Diagnosis not present

## 2015-10-19 DIAGNOSIS — D631 Anemia in chronic kidney disease: Secondary | ICD-10-CM | POA: Diagnosis not present

## 2015-10-19 DIAGNOSIS — E1129 Type 2 diabetes mellitus with other diabetic kidney complication: Secondary | ICD-10-CM | POA: Diagnosis not present

## 2015-10-19 DIAGNOSIS — N186 End stage renal disease: Secondary | ICD-10-CM | POA: Diagnosis not present

## 2015-10-20 DIAGNOSIS — E119 Type 2 diabetes mellitus without complications: Secondary | ICD-10-CM | POA: Diagnosis not present

## 2015-10-21 DIAGNOSIS — D689 Coagulation defect, unspecified: Secondary | ICD-10-CM | POA: Diagnosis not present

## 2015-10-21 DIAGNOSIS — D631 Anemia in chronic kidney disease: Secondary | ICD-10-CM | POA: Diagnosis not present

## 2015-10-21 DIAGNOSIS — Z23 Encounter for immunization: Secondary | ICD-10-CM | POA: Diagnosis not present

## 2015-10-21 DIAGNOSIS — E1129 Type 2 diabetes mellitus with other diabetic kidney complication: Secondary | ICD-10-CM | POA: Diagnosis not present

## 2015-10-21 DIAGNOSIS — N186 End stage renal disease: Secondary | ICD-10-CM | POA: Diagnosis not present

## 2015-10-23 DIAGNOSIS — E1129 Type 2 diabetes mellitus with other diabetic kidney complication: Secondary | ICD-10-CM | POA: Diagnosis not present

## 2015-10-23 DIAGNOSIS — N186 End stage renal disease: Secondary | ICD-10-CM | POA: Diagnosis not present

## 2015-10-23 DIAGNOSIS — D631 Anemia in chronic kidney disease: Secondary | ICD-10-CM | POA: Diagnosis not present

## 2015-10-23 DIAGNOSIS — D689 Coagulation defect, unspecified: Secondary | ICD-10-CM | POA: Diagnosis not present

## 2015-10-23 DIAGNOSIS — Z23 Encounter for immunization: Secondary | ICD-10-CM | POA: Diagnosis not present

## 2015-10-26 DIAGNOSIS — D689 Coagulation defect, unspecified: Secondary | ICD-10-CM | POA: Diagnosis not present

## 2015-10-26 DIAGNOSIS — N186 End stage renal disease: Secondary | ICD-10-CM | POA: Diagnosis not present

## 2015-10-26 DIAGNOSIS — E1129 Type 2 diabetes mellitus with other diabetic kidney complication: Secondary | ICD-10-CM | POA: Diagnosis not present

## 2015-10-26 DIAGNOSIS — Z23 Encounter for immunization: Secondary | ICD-10-CM | POA: Diagnosis not present

## 2015-10-26 DIAGNOSIS — D631 Anemia in chronic kidney disease: Secondary | ICD-10-CM | POA: Diagnosis not present

## 2015-10-28 DIAGNOSIS — Z23 Encounter for immunization: Secondary | ICD-10-CM | POA: Diagnosis not present

## 2015-10-28 DIAGNOSIS — D631 Anemia in chronic kidney disease: Secondary | ICD-10-CM | POA: Diagnosis not present

## 2015-10-28 DIAGNOSIS — E1129 Type 2 diabetes mellitus with other diabetic kidney complication: Secondary | ICD-10-CM | POA: Diagnosis not present

## 2015-10-28 DIAGNOSIS — D689 Coagulation defect, unspecified: Secondary | ICD-10-CM | POA: Diagnosis not present

## 2015-10-28 DIAGNOSIS — N186 End stage renal disease: Secondary | ICD-10-CM | POA: Diagnosis not present

## 2015-10-30 DIAGNOSIS — D631 Anemia in chronic kidney disease: Secondary | ICD-10-CM | POA: Diagnosis not present

## 2015-10-30 DIAGNOSIS — D689 Coagulation defect, unspecified: Secondary | ICD-10-CM | POA: Diagnosis not present

## 2015-10-30 DIAGNOSIS — N186 End stage renal disease: Secondary | ICD-10-CM | POA: Diagnosis not present

## 2015-10-30 DIAGNOSIS — E1129 Type 2 diabetes mellitus with other diabetic kidney complication: Secondary | ICD-10-CM | POA: Diagnosis not present

## 2015-10-30 DIAGNOSIS — Z23 Encounter for immunization: Secondary | ICD-10-CM | POA: Diagnosis not present

## 2015-11-02 DIAGNOSIS — N186 End stage renal disease: Secondary | ICD-10-CM | POA: Diagnosis not present

## 2015-11-02 DIAGNOSIS — Z23 Encounter for immunization: Secondary | ICD-10-CM | POA: Diagnosis not present

## 2015-11-02 DIAGNOSIS — D689 Coagulation defect, unspecified: Secondary | ICD-10-CM | POA: Diagnosis not present

## 2015-11-02 DIAGNOSIS — D631 Anemia in chronic kidney disease: Secondary | ICD-10-CM | POA: Diagnosis not present

## 2015-11-02 DIAGNOSIS — E1129 Type 2 diabetes mellitus with other diabetic kidney complication: Secondary | ICD-10-CM | POA: Diagnosis not present

## 2015-11-03 DIAGNOSIS — Z452 Encounter for adjustment and management of vascular access device: Secondary | ICD-10-CM | POA: Diagnosis not present

## 2015-11-04 DIAGNOSIS — D689 Coagulation defect, unspecified: Secondary | ICD-10-CM | POA: Diagnosis not present

## 2015-11-04 DIAGNOSIS — Z23 Encounter for immunization: Secondary | ICD-10-CM | POA: Diagnosis not present

## 2015-11-04 DIAGNOSIS — D631 Anemia in chronic kidney disease: Secondary | ICD-10-CM | POA: Diagnosis not present

## 2015-11-04 DIAGNOSIS — N186 End stage renal disease: Secondary | ICD-10-CM | POA: Diagnosis not present

## 2015-11-04 DIAGNOSIS — E1129 Type 2 diabetes mellitus with other diabetic kidney complication: Secondary | ICD-10-CM | POA: Diagnosis not present

## 2015-11-06 ENCOUNTER — Encounter: Payer: Self-pay | Admitting: Family Medicine

## 2015-11-06 ENCOUNTER — Ambulatory Visit (INDEPENDENT_AMBULATORY_CARE_PROVIDER_SITE_OTHER): Payer: Medicare Other | Admitting: Family Medicine

## 2015-11-06 VITALS — BP 150/50 | HR 74 | Temp 98.2°F | Resp 14 | Ht 71.0 in | Wt 192.0 lb

## 2015-11-06 DIAGNOSIS — E1129 Type 2 diabetes mellitus with other diabetic kidney complication: Secondary | ICD-10-CM | POA: Diagnosis not present

## 2015-11-06 DIAGNOSIS — M109 Gout, unspecified: Secondary | ICD-10-CM

## 2015-11-06 DIAGNOSIS — D631 Anemia in chronic kidney disease: Secondary | ICD-10-CM | POA: Diagnosis not present

## 2015-11-06 DIAGNOSIS — D689 Coagulation defect, unspecified: Secondary | ICD-10-CM | POA: Diagnosis not present

## 2015-11-06 DIAGNOSIS — N186 End stage renal disease: Secondary | ICD-10-CM | POA: Diagnosis not present

## 2015-11-06 DIAGNOSIS — Z23 Encounter for immunization: Secondary | ICD-10-CM | POA: Diagnosis not present

## 2015-11-06 MED ORDER — PREDNISONE 20 MG PO TABS: ORAL_TABLET | ORAL | 0 refills | Status: DC

## 2015-11-06 MED ORDER — METHYLPREDNISOLONE ACETATE 40 MG/ML IJ SUSP
60.0000 mg | Freq: Once | INTRAMUSCULAR | Status: AC
  Administered 2015-11-06: 60 mg via INTRAMUSCULAR

## 2015-11-06 NOTE — Progress Notes (Signed)
Subjective:    Patient ID: [name], male    DOB: 03-18-1945, 70 y.o.   MRN: [id number]  HPI  10/03/15 Patient recently had a gout flare that started in his left knee and also affected his left ankle. Left knee is better but the left ankle is still erythematous, swollen, painful, and warm to the touch. He is unable to take NSAIDs due to his kidney disease. He cannot afford colchicine. He denies any injury.  At that time, my plan was: Begin prednisone taper pack for gout exacerbation. Use tramadol 50 mg every 6 hours as needed for pain  11/06/15 He is having another gout flare. Now it is affecting the 1st MTP joints on each foot.  It is extremely painful and he is tried colchicine without benefit. He is on no preventative medication. He is hemodialysis dependent. Past Medical History:  Diagnosis Date  . Adenomatous colon polyp 2006  . AICD (automatic cardioverter/defibrillator) present    STJ device, implanted 12/03/14 Dr. Lovena Le  . Arthritis    Gout  . Benign prostatic hypertrophy   . CHF (congestive heart failure) ([place])   . Chronic combined systolic and diastolic CHF (congestive heart failure) ([place])    a. 02/2014 Echo: EF 20-25% (new), Gr 2 DD, mod conc LVH, mild AI/AS, mod MR, sev dil LA, mild TR.  Marland Kitchen Chronic kidney disease (CKD), stage IV (severe) ([place]) 12/2009   a. baseline creat now ~ 4.  . Colonic polyp   . Constipation   . 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 Erectile dysfunction   . Gout   . H. pylori infection 2013   treated with prevpac  . Hyperlipidemia   . Hyperparathyroidism ([place])   . Hypertension   . Low serum testosterone level   . Mild Ao Stenosis and Insufficiency   . Nonischemic cardiomyopathy ([place])    a. 02/2014 Echo: EF 20-25%;  b. 03/2014 Cath: LM nl, LAD min irregs, LCX   . Obesity, Class I, BMI 30-34.9   . Shortness of breath dyspnea   . Sleep apnea 2010   does not use CPAP  . Tobacco abuse    Past Surgical History:    Procedure Laterality Date  . AV FISTULA PLACEMENT Right 04/02/2015   Procedure: RIGHT ARM RADIOCEPHALIC ARTERIOVENOUS (AV) FISTULA CREATION;  Surgeon: Conrad [place]ia, MD;  Location: [place];  Service: Vascular;  Laterality: Right;  . CARDIAC CATHETERIZATION    . CHOLECYSTECTOMY N/A 01/07/2015   Procedure: LAPAROSCOPIC CHOLECYSTECTOMY WITH INTRAOPERATIVE CHOLANGIOGRAM;  Surgeon: Donnie Mesa, MD;  Location: [place];  Service: General;  Laterality: N/A;  . COLONOSCOPY  08/23/2004   Dr. Vivi Ferns rectum, diminutive polyp of the rectosigmoid removed, inflamed focally adenomatous polyp  . COLONOSCOPY  2013   Dr. Gala Romney: multiple diminutive polyps in distal sigmoid segment, 4 mm pedunculated polyp at hepatic flexure. Tubular adenomas. Surveillance due 2018   . EP IMPLANTABLE DEVICE N/A 12/03/2014   Procedure: ICD Implant;  Surgeon: Evans Lance, MD;  Location: [place] [facility];  Service: Cardiovascular;  Laterality: N/A;; St Jude  . ERCP N/A 01/09/2015   Procedure: ENDOSCOPIC RETROGRADE CHOLANGIOPANCREATOGRAPHY (ERCP);  Surgeon: Clarene Essex, MD;  Location: [facility] ENDOSCOPY;  Service: Endoscopy;  Laterality: N/A;  . ESOPHAGOGASTRODUODENOSCOPY  2013   Dr. Gala Romney: erosions reminiscent of GAVE but biopsies showed H.pylori gastritis  . INSERTION OF DIALYSIS CATHETER Right 04/02/2015   Procedure: INSERTION OF DIALYSIS CATHETER - RIGHT INTERNAL JUGULAR;  Surgeon: Conrad Jerusalem, MD;  Location: [place];  Service: Vascular;  Laterality: Right;  . LEFT AND RIGHT HEART CATHETERIZATION WITH CORONARY ANGIOGRAM N/A 03/19/2014   Procedure: LEFT AND RIGHT HEART CATHETERIZATION WITH CORONARY ANGIOGRAM;  Surgeon: Blane Ohara, MD;  Location: [facility] CATH LAB;  Service: Cardiovascular;  Laterality: N/A;  . LYMPH NODE BIOPSY     surgical exploration of neck-not entirely clear that this represented a lymph node biopsy  . RETINAL LASER PROCEDURE     diabetic retinopathy  . UMBILICAL HERNIA REPAIR N/A 01/07/2015   Procedure: HERNIA  REPAIR UMBILICAL ADULT;  Surgeon: Donnie Mesa, MD;  Location: [place];  Service: General;  Laterality: N/A;   Current Outpatient Prescriptions on File Prior to Visit  Medication Sig Dispense Refill  . aspirin EC 81 MG tablet Take 81 mg by mouth daily.    . calcium acetate (PHOSLO) 667 MG capsule Take 1 capsule (667 mg total) by mouth daily. Take with the biggest meal. 90 capsule 1  . Colchicine 0.6 MG CAPS Take 2 tabs at first sign of gout and repeat in 1 hour if pain persist s 30 capsule 0  . colchicine 0.6 MG tablet Take 0.6 mg by mouth as needed (gout flare up).     . hydrALAZINE (APRESOLINE) 100 MG tablet Take 1 tablet (100 mg total) by mouth 2 (two) times daily. (Patient taking differently: Take 100 mg by mouth 2 (two) times daily. Per patient taking 100mg   Monday, Wednesday and Friday) 60 tablet 1  . Insulin Glargine (LANTUS SOLOSTAR) 100 UNIT/ML Solostar Pen Inject 60 Units into the skin daily at 10 pm. 10 pen PRN  . lidocaine-prilocaine (EMLA) cream     . metoprolol succinate (TOPROL-XL) 25 MG 24 hr tablet Take 1 tablet (25 mg total) by mouth daily. (Patient taking differently: Take 50 mg by mouth daily. ) 90 tablet 3  . ONE TOUCH ULTRA TEST test strip     . predniSONE (DELTASONE) 20 MG tablet 3 tabs poqday 1-2, 2 tabs poqday 3-4, 1 tab poqday 5-6 12 tablet 0  . traMADol (ULTRAM) 50 MG tablet Take 2 tablets (100 mg total) by mouth every 8 (eight) hours as needed. 60 tablet 0   No current facility-administered medications on file prior to visit.    Allergies  Allergen Reactions  . Ace Inhibitors Cough   Social History   Social History  . Marital status: Married    Spouse name: N/A  . Number of children: 4  . Years of education: N/A   Occupational History  . Semi - Retired Administrator    Social History Main Topics  . Smoking status: Current Some Day Smoker    Packs/day: 0.25    Years: 56.00    Types: Cigarettes    Start date: 05/31/1958  . Smokeless tobacco: Never Used    . Alcohol use No  . Drug use: No  . Sexual activity: Not Currently   Other Topics Concern  . Not on file   Social History Narrative  . No narrative on file      Review of Systems  All other systems reviewed and are negative.      Objective:   Physical Exam  Cardiovascular: Normal rate, regular rhythm and normal heart sounds.   Pulmonary/Chest: Effort normal and breath sounds normal.  Musculoskeletal:       Left ankle: He exhibits decreased range of motion and swelling. He exhibits no ecchymosis, no deformity, no laceration and normal  pulse.  Vitals reviewed.         Assessment & Plan:  Gout of right foot, unspecified cause, unspecified chronicity - Plan: methylPREDNISolone acetate (DEPO-MEDROL) injection 60 mg  Patient was given depomedrol 60 mg im x1.  Start prednisone taper pack after that.  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  Recommend starting allopurinol 100 mg poqday once flare is over.

## 2015-11-09 DIAGNOSIS — Z23 Encounter for immunization: Secondary | ICD-10-CM | POA: Diagnosis not present

## 2015-11-09 DIAGNOSIS — D689 Coagulation defect, unspecified: Secondary | ICD-10-CM | POA: Diagnosis not present

## 2015-11-09 DIAGNOSIS — E1129 Type 2 diabetes mellitus with other diabetic kidney complication: Secondary | ICD-10-CM | POA: Diagnosis not present

## 2015-11-09 DIAGNOSIS — N186 End stage renal disease: Secondary | ICD-10-CM | POA: Diagnosis not present

## 2015-11-09 DIAGNOSIS — D631 Anemia in chronic kidney disease: Secondary | ICD-10-CM | POA: Diagnosis not present

## 2015-11-10 ENCOUNTER — Telehealth: Payer: Self-pay | Admitting: Family Medicine

## 2015-11-10 NOTE — Telephone Encounter (Signed)
LMTRC

## 2015-11-10 NOTE — Telephone Encounter (Signed)
-----   Message from Susy Frizzle, MD sent at 11/09/2015  6:57 AM EDT ----- I researched options over the weekned.  Evidence recommends against daily colchicine due to possible side effects.  STop daily colchicine.  Begin allopurinol 100 mg poqday ONCE flare is over to prevent gout attacks.

## 2015-11-11 DIAGNOSIS — Z23 Encounter for immunization: Secondary | ICD-10-CM | POA: Diagnosis not present

## 2015-11-11 DIAGNOSIS — D689 Coagulation defect, unspecified: Secondary | ICD-10-CM | POA: Diagnosis not present

## 2015-11-11 DIAGNOSIS — D631 Anemia in chronic kidney disease: Secondary | ICD-10-CM | POA: Diagnosis not present

## 2015-11-11 DIAGNOSIS — N186 End stage renal disease: Secondary | ICD-10-CM | POA: Diagnosis not present

## 2015-11-11 DIAGNOSIS — E1129 Type 2 diabetes mellitus with other diabetic kidney complication: Secondary | ICD-10-CM | POA: Diagnosis not present

## 2015-11-11 MED ORDER — ALLOPURINOL 100 MG PO TABS: 100.0000 mg | ORAL_TABLET | Freq: Every day | ORAL | 3 refills | Status: AC

## 2015-11-11 NOTE — Telephone Encounter (Signed)
Patient aware of providers recommendations. And med sent to pharm 

## 2015-11-13 DIAGNOSIS — E1129 Type 2 diabetes mellitus with other diabetic kidney complication: Secondary | ICD-10-CM | POA: Diagnosis not present

## 2015-11-13 DIAGNOSIS — D631 Anemia in chronic kidney disease: Secondary | ICD-10-CM | POA: Diagnosis not present

## 2015-11-13 DIAGNOSIS — D689 Coagulation defect, unspecified: Secondary | ICD-10-CM | POA: Diagnosis not present

## 2015-11-13 DIAGNOSIS — Z23 Encounter for immunization: Secondary | ICD-10-CM | POA: Diagnosis not present

## 2015-11-13 DIAGNOSIS — N186 End stage renal disease: Secondary | ICD-10-CM | POA: Diagnosis not present

## 2015-11-16 DIAGNOSIS — N186 End stage renal disease: Secondary | ICD-10-CM | POA: Diagnosis not present

## 2015-11-16 DIAGNOSIS — D631 Anemia in chronic kidney disease: Secondary | ICD-10-CM | POA: Diagnosis not present

## 2015-11-16 DIAGNOSIS — D689 Coagulation defect, unspecified: Secondary | ICD-10-CM | POA: Diagnosis not present

## 2015-11-16 DIAGNOSIS — Z23 Encounter for immunization: Secondary | ICD-10-CM | POA: Diagnosis not present

## 2015-11-16 DIAGNOSIS — E1129 Type 2 diabetes mellitus with other diabetic kidney complication: Secondary | ICD-10-CM | POA: Diagnosis not present

## 2015-11-17 DIAGNOSIS — Z992 Dependence on renal dialysis: Secondary | ICD-10-CM | POA: Diagnosis not present

## 2015-11-17 DIAGNOSIS — I120 Hypertensive chronic kidney disease with stage 5 chronic kidney disease or end stage renal disease: Secondary | ICD-10-CM | POA: Diagnosis not present

## 2015-11-17 DIAGNOSIS — N186 End stage renal disease: Secondary | ICD-10-CM | POA: Diagnosis not present

## 2015-11-18 DIAGNOSIS — N186 End stage renal disease: Secondary | ICD-10-CM | POA: Diagnosis not present

## 2015-11-18 DIAGNOSIS — D689 Coagulation defect, unspecified: Secondary | ICD-10-CM | POA: Diagnosis not present

## 2015-11-18 DIAGNOSIS — N2589 Other disorders resulting from impaired renal tubular function: Secondary | ICD-10-CM | POA: Diagnosis not present

## 2015-11-18 DIAGNOSIS — E1129 Type 2 diabetes mellitus with other diabetic kidney complication: Secondary | ICD-10-CM | POA: Diagnosis not present

## 2015-11-18 DIAGNOSIS — D631 Anemia in chronic kidney disease: Secondary | ICD-10-CM | POA: Diagnosis not present

## 2015-11-20 DIAGNOSIS — D689 Coagulation defect, unspecified: Secondary | ICD-10-CM | POA: Diagnosis not present

## 2015-11-20 DIAGNOSIS — N186 End stage renal disease: Secondary | ICD-10-CM | POA: Diagnosis not present

## 2015-11-20 DIAGNOSIS — N2589 Other disorders resulting from impaired renal tubular function: Secondary | ICD-10-CM | POA: Diagnosis not present

## 2015-11-20 DIAGNOSIS — D631 Anemia in chronic kidney disease: Secondary | ICD-10-CM | POA: Diagnosis not present

## 2015-11-20 DIAGNOSIS — E1129 Type 2 diabetes mellitus with other diabetic kidney complication: Secondary | ICD-10-CM | POA: Diagnosis not present

## 2015-11-23 DIAGNOSIS — N2589 Other disorders resulting from impaired renal tubular function: Secondary | ICD-10-CM | POA: Diagnosis not present

## 2015-11-23 DIAGNOSIS — E1129 Type 2 diabetes mellitus with other diabetic kidney complication: Secondary | ICD-10-CM | POA: Diagnosis not present

## 2015-11-23 DIAGNOSIS — D631 Anemia in chronic kidney disease: Secondary | ICD-10-CM | POA: Diagnosis not present

## 2015-11-23 DIAGNOSIS — N186 End stage renal disease: Secondary | ICD-10-CM | POA: Diagnosis not present

## 2015-11-23 DIAGNOSIS — D689 Coagulation defect, unspecified: Secondary | ICD-10-CM | POA: Diagnosis not present

## 2015-11-25 DIAGNOSIS — N2589 Other disorders resulting from impaired renal tubular function: Secondary | ICD-10-CM | POA: Diagnosis not present

## 2015-11-25 DIAGNOSIS — E1129 Type 2 diabetes mellitus with other diabetic kidney complication: Secondary | ICD-10-CM | POA: Diagnosis not present

## 2015-11-25 DIAGNOSIS — D631 Anemia in chronic kidney disease: Secondary | ICD-10-CM | POA: Diagnosis not present

## 2015-11-25 DIAGNOSIS — N186 End stage renal disease: Secondary | ICD-10-CM | POA: Diagnosis not present

## 2015-11-25 DIAGNOSIS — D689 Coagulation defect, unspecified: Secondary | ICD-10-CM | POA: Diagnosis not present

## 2015-11-27 DIAGNOSIS — E1129 Type 2 diabetes mellitus with other diabetic kidney complication: Secondary | ICD-10-CM | POA: Diagnosis not present

## 2015-11-27 DIAGNOSIS — N2589 Other disorders resulting from impaired renal tubular function: Secondary | ICD-10-CM | POA: Diagnosis not present

## 2015-11-27 DIAGNOSIS — N186 End stage renal disease: Secondary | ICD-10-CM | POA: Diagnosis not present

## 2015-11-27 DIAGNOSIS — D689 Coagulation defect, unspecified: Secondary | ICD-10-CM | POA: Diagnosis not present

## 2015-11-27 DIAGNOSIS — D631 Anemia in chronic kidney disease: Secondary | ICD-10-CM | POA: Diagnosis not present

## 2015-11-30 ENCOUNTER — Ambulatory Visit: Payer: Medicare Other | Admitting: Family Medicine

## 2015-11-30 DIAGNOSIS — D631 Anemia in chronic kidney disease: Secondary | ICD-10-CM | POA: Diagnosis not present

## 2015-11-30 DIAGNOSIS — E1129 Type 2 diabetes mellitus with other diabetic kidney complication: Secondary | ICD-10-CM | POA: Diagnosis not present

## 2015-11-30 DIAGNOSIS — N186 End stage renal disease: Secondary | ICD-10-CM | POA: Diagnosis not present

## 2015-11-30 DIAGNOSIS — N2589 Other disorders resulting from impaired renal tubular function: Secondary | ICD-10-CM | POA: Diagnosis not present

## 2015-11-30 DIAGNOSIS — D689 Coagulation defect, unspecified: Secondary | ICD-10-CM | POA: Diagnosis not present

## 2015-12-02 DIAGNOSIS — E1129 Type 2 diabetes mellitus with other diabetic kidney complication: Secondary | ICD-10-CM | POA: Diagnosis not present

## 2015-12-02 DIAGNOSIS — D689 Coagulation defect, unspecified: Secondary | ICD-10-CM | POA: Diagnosis not present

## 2015-12-02 DIAGNOSIS — N2589 Other disorders resulting from impaired renal tubular function: Secondary | ICD-10-CM | POA: Diagnosis not present

## 2015-12-02 DIAGNOSIS — D631 Anemia in chronic kidney disease: Secondary | ICD-10-CM | POA: Diagnosis not present

## 2015-12-02 DIAGNOSIS — N186 End stage renal disease: Secondary | ICD-10-CM | POA: Diagnosis not present

## 2015-12-04 DIAGNOSIS — E1129 Type 2 diabetes mellitus with other diabetic kidney complication: Secondary | ICD-10-CM | POA: Diagnosis not present

## 2015-12-04 DIAGNOSIS — D631 Anemia in chronic kidney disease: Secondary | ICD-10-CM | POA: Diagnosis not present

## 2015-12-04 DIAGNOSIS — D689 Coagulation defect, unspecified: Secondary | ICD-10-CM | POA: Diagnosis not present

## 2015-12-04 DIAGNOSIS — N2589 Other disorders resulting from impaired renal tubular function: Secondary | ICD-10-CM | POA: Diagnosis not present

## 2015-12-04 DIAGNOSIS — N186 End stage renal disease: Secondary | ICD-10-CM | POA: Diagnosis not present

## 2015-12-07 DIAGNOSIS — N186 End stage renal disease: Secondary | ICD-10-CM | POA: Diagnosis not present

## 2015-12-07 DIAGNOSIS — D631 Anemia in chronic kidney disease: Secondary | ICD-10-CM | POA: Diagnosis not present

## 2015-12-07 DIAGNOSIS — N2589 Other disorders resulting from impaired renal tubular function: Secondary | ICD-10-CM | POA: Diagnosis not present

## 2015-12-07 DIAGNOSIS — D689 Coagulation defect, unspecified: Secondary | ICD-10-CM | POA: Diagnosis not present

## 2015-12-07 DIAGNOSIS — E1129 Type 2 diabetes mellitus with other diabetic kidney complication: Secondary | ICD-10-CM | POA: Diagnosis not present

## 2015-12-08 ENCOUNTER — Encounter: Payer: Self-pay | Admitting: Internal Medicine

## 2015-12-08 ENCOUNTER — Encounter: Payer: Self-pay | Admitting: Family Medicine

## 2015-12-08 ENCOUNTER — Ambulatory Visit (INDEPENDENT_AMBULATORY_CARE_PROVIDER_SITE_OTHER): Payer: Medicare Other | Admitting: Family Medicine

## 2015-12-08 ENCOUNTER — Ambulatory Visit (INDEPENDENT_AMBULATORY_CARE_PROVIDER_SITE_OTHER): Payer: Medicare Other | Admitting: Internal Medicine

## 2015-12-08 VITALS — BP 122/40 | HR 82 | Ht 69.0 in | Wt 190.4 lb

## 2015-12-08 VITALS — BP 142/64 | HR 80 | Temp 98.3°F | Resp 14 | Ht 69.0 in | Wt 190.0 lb

## 2015-12-08 DIAGNOSIS — I5022 Chronic systolic (congestive) heart failure: Secondary | ICD-10-CM | POA: Diagnosis not present

## 2015-12-08 DIAGNOSIS — E1121 Type 2 diabetes mellitus with diabetic nephropathy: Secondary | ICD-10-CM | POA: Diagnosis not present

## 2015-12-08 DIAGNOSIS — M109 Gout, unspecified: Secondary | ICD-10-CM | POA: Insufficient documentation

## 2015-12-08 DIAGNOSIS — Z95810 Presence of automatic (implantable) cardiac defibrillator: Secondary | ICD-10-CM | POA: Diagnosis not present

## 2015-12-08 DIAGNOSIS — M1000 Idiopathic gout, unspecified site: Secondary | ICD-10-CM

## 2015-12-08 LAB — CUP PACEART INCLINIC DEVICE CHECK
Brady Statistic RV Percent Paced: 0 %
HighPow Impedance: 76.5 Ohm
Implantable Lead Implant Date: 20161116
Implantable Lead Model: 7122
Lead Channel Pacing Threshold Amplitude: 1.25 V
Lead Channel Setting Pacing Pulse Width: 0.5 ms
MDC IDC LEAD LOCATION: 753860
MDC IDC MSMT LEADCHNL RV IMPEDANCE VALUE: 425 Ohm
MDC IDC MSMT LEADCHNL RV PACING THRESHOLD PULSEWIDTH: 0.8 ms
MDC IDC MSMT LEADCHNL RV SENSING INTR AMPL: 12 mV
MDC IDC PG IMPLANT DT: 20161116
MDC IDC SESS DTM: 20171121131833
MDC IDC SET LEADCHNL RV PACING AMPLITUDE: 2.5 V
MDC IDC SET LEADCHNL RV SENSING SENSITIVITY: 0.5 mV
Pulse Gen Serial Number: 7228612

## 2015-12-08 NOTE — Patient Instructions (Signed)
We will call with lab results  F/U as needed 

## 2015-12-08 NOTE — Assessment & Plan Note (Signed)
Recheck his uric acid is now on allopurinol leases exacerbations has stopped. Seems that the benefits outweigh the risks for him with regards to the allopurinol dosing

## 2015-12-08 NOTE — Assessment & Plan Note (Signed)
His previous A1c was at goal below 7%. I will recheck this today as he is still on a good amount of insulin he may not require as much spicy with his age and the fact that he is on dialysis.

## 2015-12-08 NOTE — Progress Notes (Signed)
   Subjective:    Patient ID: [name], male    DOB: 08/04/1945, 70 y.o.   MRN: [id number]  Patient presents for Foot Pain (gout- states that he is taking allopurinol and pain has resolved)    Here  follow-up on his medications. When he called and he was still having issues with gout but that is now resolved since he's been taking his allopurinol 100 mg a regular basis. She does have end-stage renal disease he is on dialysis. He had to be treated with colchicine twice for previous flares. His last uric acid was actually 3.9 back in May.   He also has diabetes mellitus last A1c was 5.5% in February he is still on 30 units of Lantus daily he asked for samples of insulin. I do not have a recent A1c therefore will obtain one today.   He is also concerned about his aspirin states that he gets cold with dialysis he is on 81 mg he has a defibrillator. Discussed that he needs to continue taking this along with the tip metoprolol as prescribed by cardiology.   Review Of Systems:  GEN- denies fatigue, fever, weight loss,weakness, recent illness HEENT- denies eye drainage, change in vision, nasal discharge, CVS- denies chest pain, palpitations RESP- denies SOB, cough, wheeze ABD- denies N/V, change in stools, abd pain GU- denies dysuria, hematuria, dribbling, incontinence MSK- + joint pain, muscle aches, injury Neuro- denies headache, dizziness, syncope, seizure activity       Objective:    BP (!) 142/64 (BP Location: Left Arm, Patient Position: Sitting, Cuff Size: Large)   Pulse 80   Temp 98.3 F (36.8 C) (Oral)   Resp 14   Ht 5\' 9"  (1.753 m)   Wt 190 lb (86.2 kg)   SpO2 96%   BMI 28.06 kg/m  GEN- NAD, alert and oriented x3 HEENT- PERRL, EOMI, non injected sclera, pink conjunctiva, MMM, oropharynx clear CVS- RRR, no murmur, fistula bruit heard  RESP-CTAB EXT- No edema Pulses- Radial  2+        Assessment & Plan:      Problem List Items Addressed This Visit    Type II  diabetes mellitus with nephropathy ([place]) - Primary    His previous A1c was at goal below 7%. I will recheck this today as he is still on a good amount of insulin he may not require as much spicy with his age and the fact that he is on dialysis.      Relevant Orders   Hemoglobin A1c   Gout    Recheck his uric acid is now on allopurinol leases exacerbations has stopped. Seems that the benefits outweigh the risks for him with regards to the allopurinol dosing      Relevant Orders   Uric Acid      Note: This dictation was prepared with Dragon dictation along with smaller phrase technology. Any transcriptional errors that result from this process are unintentional.

## 2015-12-08 NOTE — Progress Notes (Signed)
HPI Mr. Upadhyaya returns today for ongoing followup of his ICD. He has a longstanding history of severe LV dysfunction dating back to 2010. He had normalization of his EF by echo in 2014. He has begun on HD. He feels poorly on his HD days when too much fluid is pulled off. He still has a propensity to volume overload as well. No ICD shocks. Allergies  Allergen Reactions  . Ace Inhibitors Cough     Current Outpatient Prescriptions  Medication Sig Dispense Refill  . albuterol (PROVENTIL) (2.5 MG/3ML) 0.083% nebulizer solution as directed.    Marland Kitchen allopurinol (ZYLOPRIM) 100 MG tablet Take 1 tablet (100 mg total) by mouth daily. 90 tablet 3  . aspirin EC 81 MG tablet Take 81 mg by mouth daily.    . calcium acetate (PHOSLO) 667 MG capsule Take 1 capsule (667 mg total) by mouth daily. Take with the biggest meal. 90 capsule 1  . colchicine 0.6 MG tablet Take 2 tabs by mouth at first sign of gout and repeat in 1 hour if pain persists    . Insulin Glargine (LANTUS SOLOSTAR) 100 UNIT/ML Solostar Pen Inject 60 Units into the skin daily at 10 pm. 10 pen PRN  . lidocaine-prilocaine (EMLA) cream as directed.     . metoprolol succinate (TOPROL-XL) 50 MG 24 hr tablet Take 1 tablet by mouth daily.    . ONE TOUCH ULTRA TEST test strip     . traMADol (ULTRAM) 50 MG tablet Take 100 mg by mouth every 8 (eight) hours as needed (pain).     No current facility-administered medications for this visit.      Past Medical History:  Diagnosis Date  . Adenomatous colon polyp 2006  . AICD (automatic cardioverter/defibrillator) present    STJ device, implanted 12/03/14 Dr. Lovena Le  . Arthritis    Gout  . Benign prostatic hypertrophy   . CHF (congestive heart failure) ([place])   . Chronic combined systolic and diastolic CHF (congestive heart failure) ([place])    a. 02/2014 Echo: EF 20-25% (new), Gr 2 DD, mod conc LVH, mild AI/AS, mod MR, sev dil LA, mild TR.  Marland Kitchen Chronic kidney disease (CKD), stage IV (severe) ([place])  12/2009   a. baseline creat now ~ 4.  . Colonic polyp   . Constipation   . 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 Erectile dysfunction   . Gout   . H. pylori infection 2013   treated with prevpac  . Hyperlipidemia   . Hyperparathyroidism ([place])   . Hypertension   . Low serum testosterone level   . Mild Ao Stenosis and Insufficiency   . Nonischemic cardiomyopathy ([place])    a. 02/2014 Echo: EF 20-25%;  b. 03/2014 Cath: LM nl, LAD min irregs, LCX   . Obesity, Class I, BMI 30-34.9   . Shortness of breath dyspnea   . Sleep apnea 2010   does not use CPAP  . Tobacco abuse     ROS:   All systems reviewed and negative except as noted in the HPI.   Past Surgical History:  Procedure Laterality Date  . AV FISTULA PLACEMENT Right 04/02/2015   Procedure: RIGHT ARM RADIOCEPHALIC ARTERIOVENOUS (AV) FISTULA CREATION;  Surgeon: Conrad Crowley, MD;  Location: [place];  Service: Vascular;  Laterality: Right;  . CARDIAC CATHETERIZATION    . CHOLECYSTECTOMY N/A 01/07/2015   Procedure: LAPAROSCOPIC CHOLECYSTECTOMY WITH INTRAOPERATIVE CHOLANGIOGRAM;  Surgeon: Donnie Mesa, MD;  Location: [facility];  Service: General;  Laterality: N/A;  . COLONOSCOPY  08/23/2004   Dr. Vivi Ferns rectum, diminutive polyp of the rectosigmoid removed, inflamed focally adenomatous polyp  . COLONOSCOPY  2013   Dr. Gala Romney: multiple diminutive polyps in distal sigmoid segment, 4 mm pedunculated polyp at hepatic flexure. Tubular adenomas. Surveillance due 2018   . EP IMPLANTABLE DEVICE N/A 12/03/2014   Procedure: ICD Implant;  Surgeon: Evans Lance, MD;  Location: [place] [facility];  Service: Cardiovascular;  Laterality: N/A;; St Jude  . ERCP N/A 01/09/2015   Procedure: ENDOSCOPIC RETROGRADE CHOLANGIOPANCREATOGRAPHY (ERCP);  Surgeon: Clarene Essex, MD;  Location: [facility] ENDOSCOPY;  Service: Endoscopy;  Laterality: N/A;  . ESOPHAGOGASTRODUODENOSCOPY  2013   Dr. Gala Romney: erosions reminiscent of GAVE but biopsies  showed H.pylori gastritis  . INSERTION OF DIALYSIS CATHETER Right 04/02/2015   Procedure: INSERTION OF DIALYSIS CATHETER - RIGHT INTERNAL JUGULAR;  Surgeon: Conrad Remsenburg-Speonk, MD;  Location: [place];  Service: Vascular;  Laterality: Right;  . LEFT AND RIGHT HEART CATHETERIZATION WITH CORONARY ANGIOGRAM N/A 03/19/2014   Procedure: LEFT AND RIGHT HEART CATHETERIZATION WITH CORONARY ANGIOGRAM;  Surgeon: Blane Ohara, MD;  Location: [facility] CATH LAB;  Service: Cardiovascular;  Laterality: N/A;  . LYMPH NODE BIOPSY     surgical exploration of neck-not entirely clear that this represented a lymph node biopsy  . RETINAL LASER PROCEDURE     diabetic retinopathy  . UMBILICAL HERNIA REPAIR N/A 01/07/2015   Procedure: HERNIA REPAIR UMBILICAL ADULT;  Surgeon: Donnie Mesa, MD;  Location: [facility];  Service: General;  Laterality: N/A;     Family History  Problem Relation Age of Onset  . Hypertension Mother   . Cancer Mother   . Cancer Father   . Diabetes Sister      Social History   Social History  . Marital status: Married    Spouse name: N/A  . Number of children: 4  . Years of education: N/A   Occupational History  . Semi - Retired Administrator    Social History Main Topics  . Smoking status: Current Some Day Smoker    Packs/day: 0.25    Years: 56.00    Types: Cigarettes    Start date: 05/31/1958  . Smokeless tobacco: Never Used  . Alcohol use No  . Drug use: No  . Sexual activity: Not Currently   Other Topics Concern  . Not on file   Social History Narrative  . No narrative on file     BP (!) 122/40   Pulse 82   Ht 5\' 9"  (1.753 m)   Wt 190 lb 6.4 oz (86.4 kg)   BMI 28.12 kg/m   Physical Exam:  Well appearing 70 yo man, NAD HEENT: Unremarkable Neck:  5 cm JVD, no thyromegally Lymphatics:  No adenopathy Back:  No CVA tenderness Lungs:  Clear with no wheezes HEART:  Regular rate rhythm, no murmurs, no rubs, no clicks Abd:  soft, positive bowel sounds, no organomegally, no  rebound, no guarding Ext:  2 plus pulses, no edema, no cyanosis, no clubbing Skin:  No rashes no nodules Neuro:  CN II through XII intact, motor grossly intact   ICD - his St. Jude ICD is working normally.  Assess/Plan: 1. Chronic systolic heart failure - he is no longer edematous since starting HD. He is encouraged to reduce his salt intake. 2. ESRD - he appears to be tolerating HD reasonably well. Will follow. 3. CAD - he denies anginal  symptoms. Will follow 4. ICD - his St. Jude ICD is working normally.   Mikle Bosworth.D.

## 2015-12-08 NOTE — Patient Instructions (Signed)
Medication Instructions:  Your physician recommends that you continue on your current medications as directed. Please refer to the Current Medication list given to you today.   Labwork: None Ordered   Testing/Procedures: None Ordered   Follow-Up: Your physician wants you to follow-up in: 1 year with Dr. Lovena Le. You will receive a reminder letter in the mail two months in advance. If you don't receive a letter, please call our office to schedule the follow-up appointment.   Remote monitoring is used to monitor your ICD from home. This monitoring reduces the number of office visits required to check your device to one time per year. It allows [place] to keep an eye on the functioning of your device to ensure it is working properly. You are scheduled for a device check from home on 03/08/16. You may send your transmission at any time that day. If you have a wireless device, the transmission will be sent automatically. After your physician reviews your transmission, you will receive a postcard with your next transmission date.    Any Other Special Instructions Will Be Listed Below (If Applicable).     If you need a refill on your cardiac medications before your next appointment, please call your pharmacy.

## 2015-12-09 ENCOUNTER — Other Ambulatory Visit: Payer: Self-pay | Admitting: Family Medicine

## 2015-12-09 ENCOUNTER — Encounter: Payer: Self-pay | Admitting: Family Medicine

## 2015-12-09 DIAGNOSIS — N186 End stage renal disease: Secondary | ICD-10-CM | POA: Diagnosis not present

## 2015-12-09 DIAGNOSIS — E1129 Type 2 diabetes mellitus with other diabetic kidney complication: Secondary | ICD-10-CM | POA: Diagnosis not present

## 2015-12-09 DIAGNOSIS — D689 Coagulation defect, unspecified: Secondary | ICD-10-CM | POA: Diagnosis not present

## 2015-12-09 DIAGNOSIS — D631 Anemia in chronic kidney disease: Secondary | ICD-10-CM | POA: Diagnosis not present

## 2015-12-09 DIAGNOSIS — N2589 Other disorders resulting from impaired renal tubular function: Secondary | ICD-10-CM | POA: Diagnosis not present

## 2015-12-09 LAB — URIC ACID: URIC ACID, SERUM: 3.7 mg/dL — AB (ref 4.0–8.0)

## 2015-12-09 LAB — HEMOGLOBIN A1C
Hgb A1c MFr Bld: 5.4 % (ref <5.7)
Mean Plasma Glucose: 108 mg/dL

## 2015-12-09 NOTE — Progress Notes (Signed)
[name] hemoglobin A1c is 5.4. Although his chart states that he uses 30 units of Lantus every day he does not truly do this. He typically takes 7-10 units of Lantus daily to control his sugars. He gets 30 units prescribe so that he can get more for his money with his insurance. However given how low his A1c is, I will recommend that the patient discontinue insulin altogether unless his blood sugars rise above 200.Marland Kitchen

## 2015-12-11 DIAGNOSIS — D689 Coagulation defect, unspecified: Secondary | ICD-10-CM | POA: Diagnosis not present

## 2015-12-11 DIAGNOSIS — D631 Anemia in chronic kidney disease: Secondary | ICD-10-CM | POA: Diagnosis not present

## 2015-12-11 DIAGNOSIS — E1129 Type 2 diabetes mellitus with other diabetic kidney complication: Secondary | ICD-10-CM | POA: Diagnosis not present

## 2015-12-11 DIAGNOSIS — N186 End stage renal disease: Secondary | ICD-10-CM | POA: Diagnosis not present

## 2015-12-11 DIAGNOSIS — N2589 Other disorders resulting from impaired renal tubular function: Secondary | ICD-10-CM | POA: Diagnosis not present

## 2015-12-14 DIAGNOSIS — D631 Anemia in chronic kidney disease: Secondary | ICD-10-CM | POA: Diagnosis not present

## 2015-12-14 DIAGNOSIS — D689 Coagulation defect, unspecified: Secondary | ICD-10-CM | POA: Diagnosis not present

## 2015-12-14 DIAGNOSIS — E1129 Type 2 diabetes mellitus with other diabetic kidney complication: Secondary | ICD-10-CM | POA: Diagnosis not present

## 2015-12-14 DIAGNOSIS — N186 End stage renal disease: Secondary | ICD-10-CM | POA: Diagnosis not present

## 2015-12-14 DIAGNOSIS — N2589 Other disorders resulting from impaired renal tubular function: Secondary | ICD-10-CM | POA: Diagnosis not present

## 2015-12-16 DIAGNOSIS — N186 End stage renal disease: Secondary | ICD-10-CM | POA: Diagnosis not present

## 2015-12-16 DIAGNOSIS — D631 Anemia in chronic kidney disease: Secondary | ICD-10-CM | POA: Diagnosis not present

## 2015-12-16 DIAGNOSIS — D689 Coagulation defect, unspecified: Secondary | ICD-10-CM | POA: Diagnosis not present

## 2015-12-16 DIAGNOSIS — N2589 Other disorders resulting from impaired renal tubular function: Secondary | ICD-10-CM | POA: Diagnosis not present

## 2015-12-16 DIAGNOSIS — E1129 Type 2 diabetes mellitus with other diabetic kidney complication: Secondary | ICD-10-CM | POA: Diagnosis not present

## 2015-12-17 DIAGNOSIS — Z992 Dependence on renal dialysis: Secondary | ICD-10-CM | POA: Diagnosis not present

## 2015-12-17 DIAGNOSIS — N186 End stage renal disease: Secondary | ICD-10-CM | POA: Diagnosis not present

## 2015-12-17 DIAGNOSIS — I120 Hypertensive chronic kidney disease with stage 5 chronic kidney disease or end stage renal disease: Secondary | ICD-10-CM | POA: Diagnosis not present

## 2015-12-18 ENCOUNTER — Ambulatory Visit: Payer: Medicare Other | Admitting: Physician Assistant

## 2015-12-18 DIAGNOSIS — N186 End stage renal disease: Secondary | ICD-10-CM | POA: Diagnosis not present

## 2015-12-18 DIAGNOSIS — D689 Coagulation defect, unspecified: Secondary | ICD-10-CM | POA: Diagnosis not present

## 2015-12-18 DIAGNOSIS — N2581 Secondary hyperparathyroidism of renal origin: Secondary | ICD-10-CM | POA: Diagnosis not present

## 2015-12-21 DIAGNOSIS — D689 Coagulation defect, unspecified: Secondary | ICD-10-CM | POA: Diagnosis not present

## 2015-12-21 DIAGNOSIS — N2581 Secondary hyperparathyroidism of renal origin: Secondary | ICD-10-CM | POA: Diagnosis not present

## 2015-12-21 DIAGNOSIS — N186 End stage renal disease: Secondary | ICD-10-CM | POA: Diagnosis not present

## 2015-12-23 DIAGNOSIS — N2581 Secondary hyperparathyroidism of renal origin: Secondary | ICD-10-CM | POA: Diagnosis not present

## 2015-12-23 DIAGNOSIS — N186 End stage renal disease: Secondary | ICD-10-CM | POA: Diagnosis not present

## 2015-12-23 DIAGNOSIS — D689 Coagulation defect, unspecified: Secondary | ICD-10-CM | POA: Diagnosis not present

## 2015-12-25 ENCOUNTER — Encounter: Payer: Self-pay | Admitting: Family Medicine

## 2015-12-25 ENCOUNTER — Ambulatory Visit (INDEPENDENT_AMBULATORY_CARE_PROVIDER_SITE_OTHER): Payer: Medicare Other | Admitting: Family Medicine

## 2015-12-25 VITALS — BP 132/58 | HR 84 | Temp 98.4°F | Resp 16 | Ht 71.0 in | Wt 186.0 lb

## 2015-12-25 DIAGNOSIS — B9689 Other specified bacterial agents as the cause of diseases classified elsewhere: Secondary | ICD-10-CM

## 2015-12-25 DIAGNOSIS — N186 End stage renal disease: Secondary | ICD-10-CM | POA: Diagnosis not present

## 2015-12-25 DIAGNOSIS — J019 Acute sinusitis, unspecified: Secondary | ICD-10-CM

## 2015-12-25 DIAGNOSIS — D689 Coagulation defect, unspecified: Secondary | ICD-10-CM | POA: Diagnosis not present

## 2015-12-25 DIAGNOSIS — N2581 Secondary hyperparathyroidism of renal origin: Secondary | ICD-10-CM | POA: Diagnosis not present

## 2015-12-25 MED ORDER — TIZANIDINE HCL 4 MG PO CAPS: 4.0000 mg | ORAL_CAPSULE | Freq: Three times a day (TID) | ORAL | 0 refills | Status: AC | PRN

## 2015-12-25 MED ORDER — AMOXICILLIN 875 MG PO TABS: 875.0000 mg | ORAL_TABLET | Freq: Two times a day (BID) | ORAL | 0 refills | Status: DC

## 2015-12-25 NOTE — Progress Notes (Signed)
Subjective:    Patient ID: [name], male    DOB: 01/20/1945, 70 y.o.   MRN: [id number]  HPI Patient reports a 2 week history of pain and pressure in both maxillary sinuses. They burn constantly. They hurt worse when he lies down or leans forward. He reports postnasal drip causing a nonproductive cough. He reports sinus headaches and dizziness. He denies fever. He also pulled a muscle in his lower back on Monday. He reports muscle spasms and pain from his left lower back down to his mid thigh. He denies sciatica or numbness in his left leg. His most recent hemoglobin A1c was 5.4. Past Medical History:  Diagnosis Date  . Adenomatous colon polyp 2006  . AICD (automatic cardioverter/defibrillator) present    STJ device, implanted 12/03/14 Dr. Lovena Le  . Arthritis    Gout  . Benign prostatic hypertrophy   . CHF (congestive heart failure) ([place])   . Chronic combined systolic and diastolic CHF (congestive heart failure) ([place])    a. 02/2014 Echo: EF 20-25% (new), Gr 2 DD, mod conc LVH, mild AI/AS, mod MR, sev dil LA, mild TR.  Marland Kitchen Chronic kidney disease (CKD), stage IV (severe) ([place]) 12/2009   a. baseline creat now ~ 4.  . Colonic polyp   . Constipation   . 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 Erectile dysfunction   . Gout   . H. pylori infection 2013   treated with prevpac  . Hyperlipidemia   . Hyperparathyroidism ([place])   . Hypertension   . Low serum testosterone level   . Mild Ao Stenosis and Insufficiency   . Nonischemic cardiomyopathy ([place])    a. 02/2014 Echo: EF 20-25%;  b. 03/2014 Cath: LM nl, LAD min irregs, LCX   . Obesity, Class I, BMI 30-34.9   . Shortness of breath dyspnea   . Sleep apnea 2010   does not use CPAP  . Tobacco abuse    Past Surgical History:  Procedure Laterality Date  . AV FISTULA PLACEMENT Right 04/02/2015   Procedure: RIGHT ARM RADIOCEPHALIC ARTERIOVENOUS (AV) FISTULA CREATION;  Surgeon: Conrad [place]burg, MD;  Location: [place];   Service: Vascular;  Laterality: Right;  . CARDIAC CATHETERIZATION    . CHOLECYSTECTOMY N/A 01/07/2015   Procedure: LAPAROSCOPIC CHOLECYSTECTOMY WITH INTRAOPERATIVE CHOLANGIOGRAM;  Surgeon: Donnie Mesa, MD;  Location: [place];  Service: General;  Laterality: N/A;  . COLONOSCOPY  08/23/2004   Dr. Vivi Ferns rectum, diminutive polyp of the rectosigmoid removed, inflamed focally adenomatous polyp  . COLONOSCOPY  2013   Dr. Gala Romney: multiple diminutive polyps in distal sigmoid segment, 4 mm pedunculated polyp at hepatic flexure. Tubular adenomas. Surveillance due 2018   . EP IMPLANTABLE DEVICE N/A 12/03/2014   Procedure: ICD Implant;  Surgeon: Evans Lance, MD;  Location: [place] [facility];  Service: Cardiovascular;  Laterality: N/A;; St Jude  . ERCP N/A 01/09/2015   Procedure: ENDOSCOPIC RETROGRADE CHOLANGIOPANCREATOGRAPHY (ERCP);  Surgeon: Clarene Essex, MD;  Location: [facility] ENDOSCOPY;  Service: Endoscopy;  Laterality: N/A;  . ESOPHAGOGASTRODUODENOSCOPY  2013   Dr. Gala Romney: erosions reminiscent of GAVE but biopsies showed H.pylori gastritis  . INSERTION OF DIALYSIS CATHETER Right 04/02/2015   Procedure: INSERTION OF DIALYSIS CATHETER - RIGHT INTERNAL JUGULAR;  Surgeon: Conrad Affton, MD;  Location: [place];  Service: Vascular;  Laterality: Right;  . LEFT AND RIGHT HEART CATHETERIZATION WITH CORONARY ANGIOGRAM N/A 03/19/2014   Procedure: LEFT AND RIGHT HEART CATHETERIZATION WITH CORONARY  Cyril Loosen;  Surgeon: Blane Ohara, MD;  Location[facility] CATH LAB;  Service: Cardiovascular;  Laterality: N/A;  . LYMPH NODE BIOPSY     surgical exploration of neck-not entirely clear that this represented a lymph node biopsy  . RETINAL LASER PROCEDURE     diabetic retinopathy  . UMBILICAL HERNIA REPAIR N/A 01/07/2015   Procedure: HERNIA REPAIR UMBILICAL ADULT;  Surgeon: Donnie Mesa, MD;  Location: [place];  Service: General;  Laterality: N/A;   Current Outpatient Prescriptions on File Prior to Visit  Medication Sig  Dispense Refill  . albuterol (PROVENTIL) (2.5 MG/3ML) 0.083% nebulizer solution as directed.    Marland Kitchen allopurinol (ZYLOPRIM) 100 MG tablet Take 1 tablet (100 mg total) by mouth daily. 90 tablet 3  . aspirin EC 81 MG tablet Take 81 mg by mouth daily.    . calcium acetate (PHOSLO) 667 MG capsule Take 1 capsule (667 mg total) by mouth daily. Take with the biggest meal. 90 capsule 1  . colchicine 0.6 MG tablet Take 2 tabs by mouth at first sign of gout and repeat in 1 hour if pain persists    . lidocaine-prilocaine (EMLA) cream as directed.     . metoprolol succinate (TOPROL-XL) 50 MG 24 hr tablet Take 1 tablet by mouth daily.    . ONE TOUCH ULTRA TEST test strip     . traMADol (ULTRAM) 50 MG tablet Take 100 mg by mouth every 8 (eight) hours as needed (pain).    . Insulin Glargine (LANTUS SOLOSTAR) 100 UNIT/ML Solostar Pen Inject 60 Units into the skin daily at 10 pm. (Patient not taking: Reported on 12/25/2015) 10 pen PRN   No current facility-administered medications on file prior to visit.    Allergies  Allergen Reactions  . Ace Inhibitors Cough   Social History   Social History  . Marital status: Married    Spouse name: N/A  . Number of children: 4  . Years of education: N/A   Occupational History  . Semi - Retired Administrator    Social History Main Topics  . Smoking status: Current Some Day Smoker    Packs/day: 0.25    Years: 56.00    Types: Cigarettes    Start date: 05/31/1958  . Smokeless tobacco: Never Used  . Alcohol use No  . Drug use: No  . Sexual activity: Not Currently   Other Topics Concern  . Not on file   Social History Narrative  . No narrative on file      Review of Systems  All other systems reviewed and are negative.      Objective:   Physical Exam  Constitutional: He appears well-developed and well-nourished.  HENT:  Right Ear: Tympanic membrane, external ear and ear canal normal.  Left Ear: Tympanic membrane, external ear and ear canal normal.    Nose: Mucosal edema and rhinorrhea present. Right sinus exhibits maxillary sinus tenderness and frontal sinus tenderness. Left sinus exhibits maxillary sinus tenderness and frontal sinus tenderness.  Mouth/Throat: Oropharynx is clear and moist. No oropharyngeal exudate.  Musculoskeletal:       Lumbar back: He exhibits decreased range of motion, tenderness and pain.  Vitals reviewed.         Assessment & Plan:  Acute bacterial rhinosinusitis - Plan: amoxicillin (AMOXIL) 875 MG tablet  Begin amoxicillin 875 mg by mouth twice a day for sinus infection for 10 days. Use Zanaflex 4 mg every 8 hours as needed for muscle pain in his lower back. Discontinue  all insulin.

## 2015-12-27 ENCOUNTER — Emergency Department (HOSPITAL_COMMUNITY)
Admission: EM | Admit: 2015-12-27 | Discharge: 2015-12-27 | Disposition: A | Discharge status: Home / Self Care | Payer: Medicare Other | Source: Home / Self Care | Attending: Emergency Medicine | Admitting: Emergency Medicine

## 2015-12-27 ENCOUNTER — Emergency Department (HOSPITAL_COMMUNITY): Payer: Medicare Other

## 2015-12-27 ENCOUNTER — Encounter (HOSPITAL_COMMUNITY): Payer: Self-pay | Admitting: Emergency Medicine

## 2015-12-27 DIAGNOSIS — K264 Chronic or unspecified duodenal ulcer with hemorrhage: Secondary | ICD-10-CM | POA: Diagnosis not present

## 2015-12-27 DIAGNOSIS — J449 Chronic obstructive pulmonary disease, unspecified: Secondary | ICD-10-CM | POA: Insufficient documentation

## 2015-12-27 DIAGNOSIS — R5383 Other fatigue: Secondary | ICD-10-CM

## 2015-12-27 DIAGNOSIS — N186 End stage renal disease: Secondary | ICD-10-CM | POA: Diagnosis not present

## 2015-12-27 DIAGNOSIS — G9341 Metabolic encephalopathy: Secondary | ICD-10-CM | POA: Diagnosis not present

## 2015-12-27 DIAGNOSIS — D62 Acute posthemorrhagic anemia: Secondary | ICD-10-CM | POA: Diagnosis not present

## 2015-12-27 DIAGNOSIS — R6521 Severe sepsis with septic shock: Secondary | ICD-10-CM | POA: Diagnosis not present

## 2015-12-27 DIAGNOSIS — Z515 Encounter for palliative care: Secondary | ICD-10-CM | POA: Diagnosis not present

## 2015-12-27 DIAGNOSIS — E162 Hypoglycemia, unspecified: Secondary | ICD-10-CM | POA: Diagnosis not present

## 2015-12-27 DIAGNOSIS — D689 Coagulation defect, unspecified: Secondary | ICD-10-CM | POA: Diagnosis not present

## 2015-12-27 DIAGNOSIS — A419 Sepsis, unspecified organism: Secondary | ICD-10-CM | POA: Diagnosis not present

## 2015-12-27 DIAGNOSIS — F17210 Nicotine dependence, cigarettes, uncomplicated: Secondary | ICD-10-CM

## 2015-12-27 DIAGNOSIS — E871 Hypo-osmolality and hyponatremia: Secondary | ICD-10-CM | POA: Diagnosis not present

## 2015-12-27 DIAGNOSIS — N390 Urinary tract infection, site not specified: Secondary | ICD-10-CM | POA: Diagnosis not present

## 2015-12-27 DIAGNOSIS — E11649 Type 2 diabetes mellitus with hypoglycemia without coma: Secondary | ICD-10-CM | POA: Diagnosis not present

## 2015-12-27 DIAGNOSIS — K2211 Ulcer of esophagus with bleeding: Secondary | ICD-10-CM | POA: Diagnosis not present

## 2015-12-27 DIAGNOSIS — Z7982 Long term (current) use of aspirin: Secondary | ICD-10-CM | POA: Insufficient documentation

## 2015-12-27 DIAGNOSIS — K254 Chronic or unspecified gastric ulcer with hemorrhage: Secondary | ICD-10-CM | POA: Diagnosis not present

## 2015-12-27 DIAGNOSIS — I5042 Chronic combined systolic (congestive) and diastolic (congestive) heart failure: Secondary | ICD-10-CM | POA: Insufficient documentation

## 2015-12-27 DIAGNOSIS — Z79899 Other long term (current) drug therapy: Secondary | ICD-10-CM | POA: Insufficient documentation

## 2015-12-27 DIAGNOSIS — R0602 Shortness of breath: Secondary | ICD-10-CM | POA: Insufficient documentation

## 2015-12-27 DIAGNOSIS — R0789 Other chest pain: Secondary | ICD-10-CM

## 2015-12-27 DIAGNOSIS — N185 Chronic kidney disease, stage 5: Secondary | ICD-10-CM | POA: Insufficient documentation

## 2015-12-27 DIAGNOSIS — I120 Hypertensive chronic kidney disease with stage 5 chronic kidney disease or end stage renal disease: Secondary | ICD-10-CM | POA: Diagnosis not present

## 2015-12-27 DIAGNOSIS — R0600 Dyspnea, unspecified: Secondary | ICD-10-CM

## 2015-12-27 DIAGNOSIS — E872 Acidosis: Secondary | ICD-10-CM | POA: Diagnosis not present

## 2015-12-27 DIAGNOSIS — B3781 Candidal esophagitis: Secondary | ICD-10-CM | POA: Diagnosis not present

## 2015-12-27 DIAGNOSIS — R05 Cough: Secondary | ICD-10-CM

## 2015-12-27 DIAGNOSIS — Z992 Dependence on renal dialysis: Secondary | ICD-10-CM | POA: Diagnosis not present

## 2015-12-27 DIAGNOSIS — Z66 Do not resuscitate: Secondary | ICD-10-CM | POA: Diagnosis not present

## 2015-12-27 DIAGNOSIS — E11319 Type 2 diabetes mellitus with unspecified diabetic retinopathy without macular edema: Secondary | ICD-10-CM | POA: Diagnosis not present

## 2015-12-27 DIAGNOSIS — R935 Abnormal findings on diagnostic imaging of other abdominal regions, including retroperitoneum: Secondary | ICD-10-CM | POA: Diagnosis not present

## 2015-12-27 DIAGNOSIS — E1122 Type 2 diabetes mellitus with diabetic chronic kidney disease: Secondary | ICD-10-CM

## 2015-12-27 DIAGNOSIS — A4189 Other specified sepsis: Secondary | ICD-10-CM | POA: Diagnosis not present

## 2015-12-27 DIAGNOSIS — N2581 Secondary hyperparathyroidism of renal origin: Secondary | ICD-10-CM | POA: Diagnosis not present

## 2015-12-27 DIAGNOSIS — D6959 Other secondary thrombocytopenia: Secondary | ICD-10-CM | POA: Diagnosis not present

## 2015-12-27 DIAGNOSIS — K7589 Other specified inflammatory liver diseases: Secondary | ICD-10-CM | POA: Diagnosis not present

## 2015-12-27 DIAGNOSIS — I132 Hypertensive heart and chronic kidney disease with heart failure and with stage 5 chronic kidney disease, or end stage renal disease: Secondary | ICD-10-CM | POA: Diagnosis not present

## 2015-12-27 DIAGNOSIS — A084 Viral intestinal infection, unspecified: Secondary | ICD-10-CM | POA: Diagnosis not present

## 2015-12-27 DIAGNOSIS — I428 Other cardiomyopathies: Secondary | ICD-10-CM | POA: Diagnosis not present

## 2015-12-27 DIAGNOSIS — E1121 Type 2 diabetes mellitus with diabetic nephropathy: Secondary | ICD-10-CM | POA: Diagnosis not present

## 2015-12-27 LAB — BASIC METABOLIC PANEL
Anion gap: 16 — ABNORMAL HIGH (ref 5–15)
BUN: 36 mg/dL — ABNORMAL HIGH (ref 6–20)
CO2: 27 mmol/L (ref 22–32)
Calcium: 9.1 mg/dL (ref 8.9–10.3)
Chloride: 89 mmol/L — ABNORMAL LOW (ref 101–111)
Creatinine, Ser: 8.06 mg/dL — ABNORMAL HIGH (ref 0.61–1.24)
GFR calc Af Amer: 7 mL/min — ABNORMAL LOW (ref >60)
GFR calc non Af Amer: 6 mL/min — ABNORMAL LOW (ref >60)
Glucose, Bld: 197 mg/dL — ABNORMAL HIGH (ref 65–99)
Potassium: 4.1 mmol/L (ref 3.5–5.1)
Sodium: 132 mmol/L — ABNORMAL LOW (ref 135–145)

## 2015-12-27 LAB — CBC WITH DIFFERENTIAL/PLATELET
Basophils Absolute: 0 10*3/uL (ref 0.0–0.1)
Basophils Relative: 0 %
Eosinophils Absolute: 0.2 10*3/uL (ref 0.0–0.7)
Eosinophils Relative: 1 %
HCT: 31.2 % — ABNORMAL LOW (ref 39.0–52.0)
Hemoglobin: 10.3 g/dL — ABNORMAL LOW (ref 13.0–17.0)
Lymphocytes Relative: 9 %
Lymphs Abs: 1 10*3/uL (ref 0.7–4.0)
MCH: 31 pg (ref 26.0–34.0)
MCHC: 33 g/dL (ref 30.0–36.0)
MCV: 94 fL (ref 78.0–100.0)
Monocytes Absolute: 0.8 10*3/uL (ref 0.1–1.0)
Monocytes Relative: 7 %
Neutro Abs: 10 10*3/uL — ABNORMAL HIGH (ref 1.7–7.7)
Neutrophils Relative %: 83 %
Platelets: 199 10*3/uL (ref 150–400)
RBC: 3.32 MIL/uL — ABNORMAL LOW (ref 4.22–5.81)
RDW: 17.4 % — ABNORMAL HIGH (ref 11.5–15.5)
WBC: 12.1 10*3/uL — ABNORMAL HIGH (ref 4.0–10.5)

## 2015-12-27 LAB — TROPONIN I
Troponin I: 0.19 ng/mL (ref <0.03)
Troponin I: 0.18 ng/mL (ref <0.03)

## 2015-12-27 LAB — BRAIN NATRIURETIC PEPTIDE: B Natriuretic Peptide: >4500 pg/mL — ABNORMAL HIGH (ref 0.0–100.0)

## 2015-12-27 NOTE — ED Triage Notes (Addendum)
PT states had scheduled dialysis last on 12/25/15 and stayed on the machine the full 4 hours. PT states new cough with thick white sputum and SOB on exertion and body aches x2 days. PT was seen at PCP x2 days ago and dx with sinus infection but pt didn't get antibiotic filled until yesterday due to the weather per spouse.

## 2015-12-27 NOTE — ED Notes (Signed)
Dr Wilson Singer notified of Troponin 0.18 called by Lab.

## 2015-12-27 NOTE — ED Notes (Signed)
CRITICAL VALUE ALERT  Critical value received:  Troponin 0.19  Date of notification:  12/27/15  Time of notification:  1119  Critical value read back:Yes.    Nurse who received alert:  Norm Salt, RN  MD notified (1st page):  Dr. Wilson Singer  Time of first page:  1119  MD notified (2nd page):  Time of second page:  Responding MD:  Dr. Wilson Singer  Time MD responded:  [phone]

## 2015-12-27 NOTE — ED Provider Notes (Signed)
[facility] DEPT Provider Note   CSN: [id number] Arrival date & time: 12/27/15  0908  By signing my name below, I, Dolores Hoose, attest that this documentation has been prepared under the direction and in the presence of Virgel Manifold, MD . Electronically Signed: Dolores Hoose, Scribe. 12/27/2015. 9:16 AM.  History   Chief Complaint Chief Complaint  Patient presents with  . Shortness of Breath   The history is provided by the patient. No language interpreter was used.   HPI Comments:  GENARD RASMUSON is a 70 y.o. male with pmhx of stage IV chronic kidney disease and DM who presents to the Emergency Department complaining of sudden-onset unchanged SOB beginning 2 days ago. He notes that his SOB is worsened when laying flat or with minor exertion. Pt has tried at-home breathing treatments with minimal relief. He reports associated fatigue, productive cough, and chest wall pain and tightness that radiates to his back and neck, worsened with deep inhalation. He denies any bloody stool. Per wife, pt was seen by his PCP 2 days ago and was diagnosed with a sinus infection. He was given abx, which he started taking yesterday, but his symptoms have not alleviated. Pt is a MWF dialysis patient since March 2017 and was on the machine for 4 hours 2 days ago (friday - 12/25/15).  Past Medical History:  Diagnosis Date  . Adenomatous colon polyp 2006  . AICD (automatic cardioverter/defibrillator) present    STJ device, implanted 12/03/14 Dr. Lovena Le  . Arthritis    Gout  . Benign prostatic hypertrophy   . CHF (congestive heart failure) ([place])   . Chronic combined systolic and diastolic CHF (congestive heart failure) ([place])    a. 02/2014 Echo: EF 20-25% (new), Gr 2 DD, mod conc LVH, mild AI/AS, mod MR, sev dil LA, mild TR.  Marland Kitchen Chronic kidney disease (CKD), stage IV (severe) ([place]) 12/2009   a. baseline creat now ~ 4.  . Colonic polyp   . Constipation   . 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 Erectile dysfunction   . Gout   . H. pylori infection 2013   treated with prevpac  . Hyperlipidemia   . Hyperparathyroidism ([place])   . Hypertension   . Low serum testosterone level   . Mild Ao Stenosis and Insufficiency   . Nonischemic cardiomyopathy ([place])    a. 02/2014 Echo: EF 20-25%;  b. 03/2014 Cath: LM nl, LAD min irregs, LCX   . Obesity, Class I, BMI 30-34.9   . Shortness of breath dyspnea   . Sleep apnea 2010   does not use CPAP  . Tobacco abuse     Patient Active Problem List   Diagnosis Date Noted  . Gout 12/08/2015  . Fluid overload 03/29/2015  . CKD (chronic kidney disease) stage 5, GFR less than 15 ml/min (HCC) 03/29/2015  . ARF (acute renal failure) ([place]) 03/29/2015  . Acute on chronic combined systolic and diastolic CHF (congestive heart failure) ([place]) 03/29/2015  . Acute systolic CHF (congestive heart failure) ([place]) 03/08/2015  . CHF exacerbation ([place]) 01/29/2015  . ICD (implantable cardioverter-defibrillator), single, in situ; St Jude 01/29/2015  . Common bile duct stone   . Chronic cholecystitis with calculus 01/08/2015  . Type II diabetes mellitus with nephropathy ([place]) 01/07/2015  . Acute biliary pancreatitis   . Gallstone pancreatitis 01/03/2015  . Pancreatitis 01/03/2015  . Ventricular tachycardia ([place]) 11/17/2014  . Weakness 05/20/2014  . Nonischemic cardiomyopathy ([place]) 03/20/2014  .  Cardiomyopathy ([place])   . Chronic systolic heart failure ([place]) 03/18/2014  . Palpitations 02/26/2014  . CHF (congestive heart failure) ([place]) 12/01/2013  . Acute on chronic diastolic CHF (congestive heart failure), NYHA class 2 ([place]) 12/01/2013  . HTN (hypertension) 12/01/2013  . Chest pain 11/02/2013  . Hypokalemia 11/02/2013  . Moderate mitral regurgitation 08/12/2011  . Cholelithiasis 08/10/2011  . Moderate dehydration 07/25/2011  . Chronic systolic congestive heart failure ([place]) 07/21/2011  . Dyspnea 07/19/2011  . COPD (chronic obstructive  pulmonary disease) ([place]) 07/18/2011  . Pulmonary nodule 07/14/2011  . Poor appetite 07/12/2011  . Change in bowel movement 07/12/2011  . Syncope 05/12/2011  . Sinusitis 05/12/2011  . Ankle pain, left 02/03/2011  . ED (erectile dysfunction) 11/02/2010  . Aortic valve disease 04/16/2010  . Chronic kidney disease, stage 4, severely decreased GFR ([place]) 04/16/2010  . SLEEP APNEA 01/14/2010  . DEPRESSION 06/29/2009  . BENIGN PROSTATIC HYPERTROPHY, HX OF 03/02/2009  . DEPENDENT EDEMA, LEGS 02/18/2008  . Diabetes mellitus type 2 with retinopathy ([place]) 02/23/2007  . TOBACCO ABUSE 02/23/2007  . Essential hypertension 02/23/2007    Past Surgical History:  Procedure Laterality Date  . AV FISTULA PLACEMENT Right 04/02/2015   Procedure: RIGHT ARM RADIOCEPHALIC ARTERIOVENOUS (AV) FISTULA CREATION;  Surgeon: Conrad Rogers, MD;  Location: [facility];  Service: Vascular;  Laterality: Right;  . CARDIAC CATHETERIZATION    . CHOLECYSTECTOMY N/A 01/07/2015   Procedure: LAPAROSCOPIC CHOLECYSTECTOMY WITH INTRAOPERATIVE CHOLANGIOGRAM;  Surgeon: Donnie Mesa, MD;  Location: [facility];  Service: General;  Laterality: N/A;  . COLONOSCOPY  08/23/2004   Dr. Vivi Ferns rectum, diminutive polyp of the rectosigmoid removed, inflamed focally adenomatous polyp  . COLONOSCOPY  2013   Dr. Gala Romney: multiple diminutive polyps in distal sigmoid segment, 4 mm pedunculated polyp at hepatic flexure. Tubular adenomas. Surveillance due 2018   . EP IMPLANTABLE DEVICE N/A 12/03/2014   Procedure: ICD Implant;  Surgeon: Evans Lance, MD;  Location: [facility];  Service: Cardiovascular;  Laterality: N/A;; St Jude  . ERCP N/A 01/09/2015   Procedure: ENDOSCOPIC RETROGRADE CHOLANGIOPANCREATOGRAPHY (ERCP);  Surgeon: Clarene Essex, MD;  Location: [facility] ENDOSCOPY;  Service: Endoscopy;  Laterality: N/A;  . ESOPHAGOGASTRODUODENOSCOPY  2013   Dr. Gala Romney: erosions reminiscent of GAVE but biopsies showed H.pylori gastritis  . INSERTION OF DIALYSIS  CATHETER Right 04/02/2015   Procedure: INSERTION OF DIALYSIS CATHETER - RIGHT INTERNAL JUGULAR;  Surgeon: Conrad Persia, MD;  Location: [facility];  Service: Vascular;  Laterality: Right;  . LEFT AND RIGHT HEART CATHETERIZATION WITH CORONARY ANGIOGRAM N/A 03/19/2014   Procedure: LEFT AND RIGHT HEART CATHETERIZATION WITH CORONARY ANGIOGRAM;  Surgeon: Blane Ohara, MD;  Location[facility] CATH LAB;  Service: Cardiovascular;  Laterality: N/A;  . LYMPH NODE BIOPSY     surgical exploration of neck-not entirely clear that this represented a lymph node biopsy  . RETINAL LASER PROCEDURE     diabetic retinopathy  . UMBILICAL HERNIA REPAIR N/A 01/07/2015   Procedure: HERNIA REPAIR UMBILICAL ADULT;  Surgeon: Donnie Mesa, MD;  Location: [facility];  Service: General;  Laterality: N/A;    OB History    No data available       Home Medications    Prior to Admission medications   Medication Sig Start Date End Date Taking? Authorizing Provider  albuterol (PROVENTIL) (2.5 MG/3ML) 0.083% nebulizer solution as directed. 12/07/15  Yes Historical Provider, MD  allopurinol (ZYLOPRIM) 100 MG tablet Take 1 tablet (100 mg total) by mouth daily. 11/11/15  Yes  Susy Frizzle, MD  amoxicillin (AMOXIL) 875 MG tablet Take 1 tablet (875 mg total) by mouth 2 (two) times daily. 12/25/15  Yes Susy Frizzle, MD  aspirin EC 81 MG tablet Take 81 mg by mouth daily.   Yes Historical Provider, MD  calcium acetate (PHOSLO) 667 MG capsule Take 1 capsule (667 mg total) by mouth daily. Take with the biggest meal. 06/05/15  Yes Susy Frizzle, MD  Cholecalciferol (VITAMIN D PO) Take 1 capsule by mouth daily.   Yes Historical Provider, MD  lidocaine-prilocaine (EMLA) cream as directed.  06/19/15  Yes Historical Provider, MD  metoprolol succinate (TOPROL-XL) 50 MG 24 hr tablet Take 1 tablet by mouth daily. 10/29/15  Yes Historical Provider, MD  ONE TOUCH ULTRA TEST test strip  06/02/15  Yes Historical Provider, MD  tiZANidine (ZANAFLEX) 4 MG  capsule Take 1 capsule (4 mg total) by mouth 3 (three) times daily as needed for muscle spasms. 12/25/15  Yes Susy Frizzle, MD    Family History Family History  Problem Relation Age of Onset  . Hypertension Mother   . Cancer Mother   . Cancer Father   . Diabetes Sister     Social History Social History  Substance Use Topics  . Smoking status: Current Some Day Smoker    Packs/day: 0.25    Years: 56.00    Types: Cigarettes    Start date: 05/31/1958  . Smokeless tobacco: Never Used  . Alcohol use No     Allergies   Ace inhibitors   Review of Systems Review of Systems  Constitutional: Positive for fatigue.  Respiratory: Positive for cough, chest tightness and shortness of breath.   Gastrointestinal: Negative for blood in stool.  All other systems reviewed and are negative.    Physical Exam Updated Vital Signs BP (!) 131/51 (BP Location: Left Arm)   Pulse 89   Temp 97.5 F (36.4 C) (Oral)   Resp 20   Ht 5\' 9"  (1.753 m)   Wt 186 lb (84.4 kg)   SpO2 100%   BMI 27.47 kg/m   Physical Exam  Constitutional: He is oriented to person, place, and time. He appears well-developed and well-nourished.  HENT:  Head: Normocephalic and atraumatic.  Eyes: EOM are normal.  Neck: Normal range of motion.  Cardiovascular: Normal rate, regular rhythm, normal heart sounds and intact distal pulses.   Pulmonary/Chest: Effort normal and breath sounds normal. No respiratory distress.  Abdominal: Soft. He exhibits no distension. There is no tenderness.  Musculoskeletal: Normal range of motion.  Symmetric pitting lower extremity edema.   Neurological: He is alert and oriented to person, place, and time.  Skin: Skin is warm and dry.  Psychiatric: He has a normal mood and affect. Judgment normal.  Nursing note and vitals reviewed.    ED Treatments / Results  DIAGNOSTIC STUDIES:  Oxygen Saturation is 100% on RA, normal by my interpretation.    COORDINATION OF CARE:  9:41 AM  Discussed treatment plan with pt at bedside and pt agreed to plan.  Labs (all labs ordered are listed, but only abnormal results are displayed) Labs Reviewed  CBC WITH DIFFERENTIAL/PLATELET - Abnormal; Notable for the following:       Result Value   WBC 12.1 (*)    RBC 3.32 (*)    Hemoglobin 10.3 (*)    HCT 31.2 (*)    RDW 17.4 (*)    Neutro Abs 10.0 (*)    All other components within normal  limits  BASIC METABOLIC PANEL - Abnormal; Notable for the following:    Sodium 132 (*)    Chloride 89 (*)    Glucose, Bld 197 (*)    BUN 36 (*)    Creatinine, Ser 8.06 (*)    GFR calc non Af Amer 6 (*)    GFR calc Af Amer 7 (*)    Anion gap 16 (*)    All other components within normal limits  TROPONIN I - Abnormal; Notable for the following:    Troponin I 0.19 (*)    All other components within normal limits  BRAIN NATRIURETIC PEPTIDE - Abnormal; Notable for the following:    B Natriuretic Peptide >4,500.0 (*)    All other components within normal limits  TROPONIN I - Abnormal; Notable for the following:    Troponin I 0.18 (*)    All other components within normal limits    EKG  EKG Interpretation None       Radiology Ct Abdomen Pelvis Wo Contrast  Result Date: 01/05/2016 CLINICAL DATA:  They were called out for unconscious. Upon arrival they took patients blood sugar and it was 23; sepsis; elevated liver enzymes; ams EXAM: CT ABDOMEN AND PELVIS WITHOUT CONTRAST TECHNIQUE: Multidetector CT imaging of the abdomen and pelvis was performed following the standard protocol without IV contrast. COMPARISON:  CT 01/03/2015 FINDINGS: Lower chest: Lung bases are clear. Hepatobiliary: 3.6 cm cyst in the LEFT lateral hepatic lobe. Pneumobilia in common bile duct and LEFT hepatic lobe. Interval cholecystectomy. No biliary dilatation. Pancreas: Pancreas is normal. No ductal dilatation. No pancreatic inflammation. Spleen: Normal spleen Adrenals/urinary tract: Adrenal glands are mildly thickened.  There are several simple fluid attenuation lesions associated with the kidneys. No ureterolithiasis or obstructive uropathy. Foley catheter within collapsed bladder. Stomach/Bowel: Stomach is distended by fluid. No obstructing lesions identified. The small bowel and cecum are normal. The appendix is normal. The colon and rectosigmoid colon are normal. Vascular/Lymphatic: Abdominal aorta is normal caliber with atherosclerotic calcification. There is no retroperitoneal or periportal lymphadenopathy. No pelvic lymphadenopathy. Reproductive: Prostate normal Other: No free fluid. Musculoskeletal: Round well-circumscribed lesion in the LEFT femoral neck has a thin sclerotic rim appears benign. No change from comparison examNo aggressive osseous lesion. IMPRESSION: 1. Large volume of fluid within the stomach suggests gastroparesis 2. Pneumobilia is new from comparison exam. Interval cholecystectomy and presumably a sphincterotomy. 3.  Atherosclerotic calcification of the aorta. 4. Foley catheter in bladder. 5. Stable benign-appearing sclerotic lesion in the LEFT femur. Electronically Signed   By: Suzy Bouchard M.D.   On: 01/15/2016 12:10   Ct Head Wo Contrast  Result Date: 01/08/2016 CLINICAL DATA:  Altered mental status.  Hypoglycemia. EXAM: CT HEAD WITHOUT CONTRAST TECHNIQUE: Contiguous axial images were obtained from the base of the skull through the vertex without intravenous contrast. COMPARISON:  None. FINDINGS: Brain: There is mild to moderate diffuse atrophy. There is no intracranial mass, hemorrhage, extra-axial fluid collection, or midline shift. There is patchy small vessel disease in the centra semiovale bilaterally, primarily posteriorly and superiorly. Elsewhere gray-white compartments appear unremarkable. No acute infarct is evident. There is calcification in each basal ganglia region, a physiologic finding in this age group. Vascular: There is no hyperdense vessel. There are foci of calcification in  each carotid siphon region. Skull: The bony calvarium appears intact. Sinuses/Orbits: There is a retention cyst in the left maxillary antrum. There is mild mucosal thickening in several ethmoid air cells bilaterally. Other visualized paranasal sinuses are clear. Orbits  appear symmetric bilaterally. Other: There is opacification of several inferior mastoid air cells on the left. Visualized mastoid air cells elsewhere appear clear. There is mild debris in each external auditory canal. IMPRESSION: Atrophy with patchy periventricular small vessel disease. No intracranial mass, hemorrhage, or extra-axial fluid collection. No acute infarct evident. Basal ganglia calcification bilaterally is physiologic in this age group. Foci of arterial vascular calcification noted. Mild left-sided mastoid air cell disease. Probable cerumen in each external auditory canal. Foci of paranasal sinus disease at several sites. Electronically Signed   By: Lowella Grip III M.D.   On: 12/29/2015 11:00   [place] Art/ven Flow Abd Pelv Doppler  Result Date: 12/31/2015 CLINICAL DATA:  On conscious. Sepsis. Elevated liver function tests. EXAM: DUPLEX ULTRASOUND OF LIVER TECHNIQUE: Color and duplex Doppler ultrasound was performed to evaluate the hepatic in-flow and out-flow vessels. COMPARISON:  None. FINDINGS: Portal Vein Velocities Main:  38 cm/sec Right:  40 cm/sec Left:  13 cm/sec Hepatic Vein Velocities Right:  55 cm/sec Middle:  75 cm/sec Left:  65 cm/sec Hepatic Artery Velocity:  210 cm/sec Splenic Vein Velocity: The patient refused imaging of the splenic vein cm/sec Varices: Absent Ascites: Absent Portal veins are hepatopetal and directionality of flow. Hepatic veins are hepatofugal. IMPRESSION: Hepatic and portal veins are all patent with normal directionality of flow. The patient refused imaging of the splenic vein. Electronically Signed   By: Marybelle Killings M.D.   On: 12/31/2015 14:42   Dg Chest Port 1 View  Result Date:  12/18/2015 CLINICAL DATA:  Sepsis EXAM: PORTABLE CHEST 1 VIEW COMPARISON:  12/27/2015 FINDINGS: Cardiac shadow is enlarged. A defibrillator is again noted and stable. The lungs are well aerated bilaterally without focal infiltrate or sizable effusion. No acute bony abnormality is seen. IMPRESSION: No active disease. Electronically Signed   By: Inez Catalina M.D.   On: 12/18/2015 10:42   US Abdomen Limited Ruq  Result Date: 12/31/2015 CLINICAL DATA:  Elevated liver function tests EXAM: US ABDOMEN LIMITED - RIGHT UPPER QUADRANT COMPARISON:  None. FINDINGS: Gallbladder: Absent. Common bile duct: Diameter: 2.6 mm. Liver: Simple cyst in the left lobe measures 3.8 cm. No solid mass. Liver echotexture is heterogeneous. IMPRESSION: Gallbladder is absent compatible with cholecystectomy. Simple cyst in the liver. No solid mass. Liver echotexture is heterogeneous suggesting diffuse hepatic parenchymal disease. Electronically Signed   By: Marybelle Killings M.D.   On: 12/31/2015 14:44    Procedures Procedures (including critical care time)  Medications Ordered in ED Medications - No data to display   Initial Impression / Assessment and Plan / ED Course  I have reviewed the triage vital signs and the nursing notes.  Pertinent labs & imaging results that were available during my care of the patient were reviewed by me and considered in my medical decision making (see chart for details).  Clinical Course     70yM with dyspnea. No distress on exam. Sounds clear. HD stable. Anemia not acute worse. Not overtly volume overloaded. Doubt PE or other emergent process.   Final Clinical Impressions(s) / ED Diagnoses   Final diagnoses:  Dyspnea, unspecified type    New Prescriptions New Prescriptions   No medications on file    I personally preformed the services scribed in my presence. The recorded information has been reviewed is accurate. Virgel Manifold, MD.     Virgel Manifold, MD 12/31/15 2116

## 2015-12-28 DIAGNOSIS — N186 End stage renal disease: Secondary | ICD-10-CM | POA: Diagnosis not present

## 2015-12-28 DIAGNOSIS — D689 Coagulation defect, unspecified: Secondary | ICD-10-CM | POA: Diagnosis not present

## 2015-12-28 DIAGNOSIS — N2581 Secondary hyperparathyroidism of renal origin: Secondary | ICD-10-CM | POA: Diagnosis not present

## 2015-12-29 ENCOUNTER — Encounter: Payer: Self-pay | Admitting: Family Medicine

## 2015-12-29 ENCOUNTER — Ambulatory Visit (INDEPENDENT_AMBULATORY_CARE_PROVIDER_SITE_OTHER): Payer: Medicare Other | Admitting: Family Medicine

## 2015-12-29 VITALS — BP 144/70 | HR 82 | Temp 98.2°F | Resp 18 | Ht 71.0 in | Wt 182.0 lb

## 2015-12-29 DIAGNOSIS — I120 Hypertensive chronic kidney disease with stage 5 chronic kidney disease or end stage renal disease: Secondary | ICD-10-CM | POA: Diagnosis not present

## 2015-12-29 DIAGNOSIS — Z992 Dependence on renal dialysis: Secondary | ICD-10-CM | POA: Diagnosis not present

## 2015-12-29 DIAGNOSIS — N186 End stage renal disease: Secondary | ICD-10-CM | POA: Diagnosis not present

## 2015-12-29 DIAGNOSIS — A084 Viral intestinal infection, unspecified: Secondary | ICD-10-CM

## 2015-12-29 MED ORDER — PROMETHAZINE HCL 12.5 MG PO TABS: 12.5000 mg | ORAL_TABLET | Freq: Four times a day (QID) | ORAL | 0 refills | Status: AC | PRN

## 2015-12-29 MED ORDER — PROMETHAZINE HCL 25 MG/ML IJ SOLN
25.0000 mg | Freq: Once | INTRAMUSCULAR | Status: AC
  Administered 2015-12-29: 25 mg via INTRAMUSCULAR

## 2015-12-29 NOTE — Progress Notes (Addendum)
   Subjective:    Patient ID: [name], male    DOB: 12-15-1945, 70 y.o.   MRN: [id number]  Patient presents for Shortness of Breath (has been seen at ER) and Abd Pain (middle abd pain- nausea- states that he can't eat anything) Patient here for ER follow-up. He was actually seen on the eighth in our office with sinus pressure and drainage diagnosed with rhinosinusitis he was given amoxicillin but she started taking the next day. The next day he also had shortness of breath with mild cough abdominal pain he was seen in the ER on the 10th EKG was unremarkable chest x-ray was clear his labs showed a mild elevated white blood cell count of 12.1 and he also has mild anemia of renal disease 10.3. He has been going to dialysis but they were unable to pull a lot of fluid yesterday. He has not been eating and drinking very well he's had diarrhea 4 times a day and nonbloody. He just feels weak. His wife states he is not eating regularly he has not been injecting any insulin of note   + nausea emesis x 1 Sunday  Review Of Systems:  GEN-+ fatigue, fever, weight loss,weakness, recent illness HEENT- denies eye drainage, change in vision, nasal discharge, CVS- denies chest pain, palpitations RESP- +SOB, +cough, wheeze ABD- denies N/V, +change in stools, +abd pain GU- denies dysuria, hematuria, dribbling, incontinence MSK- denies joint pain, muscle aches, injury Neuro- denies headache, dizziness, syncope, seizure activity       Objective:    BP (!) 144/70 (BP Location: Left Arm, Patient Position: Sitting, Cuff Size: Large)   Pulse 82   Temp 98.2 F (36.8 C) (Oral)   Resp 18   Ht 5\' 11"  (1.803 m)   Wt 182 lb (82.6 kg)   SpO2 95%   BMI 25.38 kg/m  GEN- NAD, alert and oriented x3 HEENT- PERRL, EOMI, non injected sclera, pink conjunctiva, MMM, oropharynx clear, nares clear rhinorrhea, no maxillary sinus tenderness, TM clear bilat  Neck- Supple, no LAD  CVS- RRR, no  murmur RESP-CTAB ABD-NABS,softt, mild lower qaudrants, no rebound, no guarding, no CVA tenderness  EXT- No edema Pulses- Radial - 2+        Assessment & Plan:      Problem List Items Addressed This Visit    None    Visit Diagnoses    Viral gastroenteritis    -  Primary   My concern is for morbid viral illness. He has not had any fever more GI symptoms. Chest x-ray is negative mild elevation in white blood cell count. I've given him a shot of Phenergan today he needs to try to eat something which is contributing to his weakness he also had dialysis yesterday. Recommend that he pushes oral fluids and sensitive given him on IV said he does not become too overloaded quickly. He can also take a dose of Imodium if he continues to have diarrhea. No red flags seen on exam his heart rate is normal his blood pressure looks okay he is afebrile.  Also recommend he stop the amoxicllin which can also be worsening his diarrhea. He has had 5 days of antibiotics Phenergan tabs given   Relevant Medications   promethazine (PHENERGAN) injection 25 mg (Completed)      Note: This dictation was prepared with Dragon dictation along with smaller phrase technology. Any transcriptional errors that result from this process are unintentional.

## 2015-12-29 NOTE — Patient Instructions (Addendum)
Stop the amoxicllin Take the phenergan as needed Use immodium Gets fluids by mouth  F/U as needed

## 2015-12-30 ENCOUNTER — Emergency Department (HOSPITAL_COMMUNITY): Payer: Medicare Other

## 2015-12-30 ENCOUNTER — Encounter (HOSPITAL_COMMUNITY): Payer: Self-pay | Admitting: *Deleted

## 2015-12-30 ENCOUNTER — Inpatient Hospital Stay (HOSPITAL_COMMUNITY)
Admission: EM | Admit: 2015-12-30 | Discharge: 2016-01-18 | DRG: 871 | Disposition: E | Payer: Medicare Other | Attending: Family Medicine | Admitting: Family Medicine

## 2015-12-30 DIAGNOSIS — I420 Dilated cardiomyopathy: Secondary | ICD-10-CM | POA: Diagnosis not present

## 2015-12-30 DIAGNOSIS — D6959 Other secondary thrombocytopenia: Secondary | ICD-10-CM | POA: Diagnosis not present

## 2015-12-30 DIAGNOSIS — R6521 Severe sepsis with septic shock: Secondary | ICD-10-CM | POA: Diagnosis present

## 2015-12-30 DIAGNOSIS — R7989 Other specified abnormal findings of blood chemistry: Secondary | ICD-10-CM | POA: Diagnosis not present

## 2015-12-30 DIAGNOSIS — Z515 Encounter for palliative care: Secondary | ICD-10-CM | POA: Diagnosis not present

## 2015-12-30 DIAGNOSIS — R945 Abnormal results of liver function studies: Secondary | ICD-10-CM

## 2015-12-30 DIAGNOSIS — K3189 Other diseases of stomach and duodenum: Secondary | ICD-10-CM | POA: Diagnosis not present

## 2015-12-30 DIAGNOSIS — N2581 Secondary hyperparathyroidism of renal origin: Secondary | ICD-10-CM | POA: Diagnosis present

## 2015-12-30 DIAGNOSIS — A419 Sepsis, unspecified organism: Secondary | ICD-10-CM | POA: Diagnosis present

## 2015-12-30 DIAGNOSIS — I359 Nonrheumatic aortic valve disorder, unspecified: Secondary | ICD-10-CM | POA: Diagnosis not present

## 2015-12-30 DIAGNOSIS — Z888 Allergy status to other drugs, medicaments and biological substances status: Secondary | ICD-10-CM

## 2015-12-30 DIAGNOSIS — A084 Viral intestinal infection, unspecified: Secondary | ICD-10-CM | POA: Diagnosis present

## 2015-12-30 DIAGNOSIS — J449 Chronic obstructive pulmonary disease, unspecified: Secondary | ICD-10-CM | POA: Diagnosis present

## 2015-12-30 DIAGNOSIS — K259 Gastric ulcer, unspecified as acute or chronic, without hemorrhage or perforation: Secondary | ICD-10-CM | POA: Diagnosis not present

## 2015-12-30 DIAGNOSIS — R531 Weakness: Secondary | ICD-10-CM | POA: Diagnosis not present

## 2015-12-30 DIAGNOSIS — E1121 Type 2 diabetes mellitus with diabetic nephropathy: Secondary | ICD-10-CM | POA: Diagnosis present

## 2015-12-30 DIAGNOSIS — I5022 Chronic systolic (congestive) heart failure: Secondary | ICD-10-CM | POA: Diagnosis not present

## 2015-12-30 DIAGNOSIS — K254 Chronic or unspecified gastric ulcer with hemorrhage: Secondary | ICD-10-CM | POA: Diagnosis present

## 2015-12-30 DIAGNOSIS — K7589 Other specified inflammatory liver diseases: Secondary | ICD-10-CM | POA: Diagnosis present

## 2015-12-30 DIAGNOSIS — I255 Ischemic cardiomyopathy: Secondary | ICD-10-CM | POA: Diagnosis not present

## 2015-12-30 DIAGNOSIS — E11319 Type 2 diabetes mellitus with unspecified diabetic retinopathy without macular edema: Secondary | ICD-10-CM | POA: Diagnosis present

## 2015-12-30 DIAGNOSIS — M79604 Pain in right leg: Secondary | ICD-10-CM | POA: Diagnosis not present

## 2015-12-30 DIAGNOSIS — A09 Infectious gastroenteritis and colitis, unspecified: Secondary | ICD-10-CM | POA: Diagnosis not present

## 2015-12-30 DIAGNOSIS — I132 Hypertensive heart and chronic kidney disease with heart failure and with stage 5 chronic kidney disease, or end stage renal disease: Secondary | ICD-10-CM | POA: Diagnosis present

## 2015-12-30 DIAGNOSIS — Z95810 Presence of automatic (implantable) cardiac defibrillator: Secondary | ICD-10-CM | POA: Diagnosis present

## 2015-12-30 DIAGNOSIS — E875 Hyperkalemia: Secondary | ICD-10-CM | POA: Diagnosis present

## 2015-12-30 DIAGNOSIS — E1151 Type 2 diabetes mellitus with diabetic peripheral angiopathy without gangrene: Secondary | ICD-10-CM | POA: Diagnosis present

## 2015-12-30 DIAGNOSIS — K264 Chronic or unspecified duodenal ulcer with hemorrhage: Secondary | ICD-10-CM | POA: Diagnosis present

## 2015-12-30 DIAGNOSIS — G9341 Metabolic encephalopathy: Secondary | ICD-10-CM | POA: Diagnosis present

## 2015-12-30 DIAGNOSIS — I428 Other cardiomyopathies: Secondary | ICD-10-CM | POA: Diagnosis present

## 2015-12-30 DIAGNOSIS — E785 Hyperlipidemia, unspecified: Secondary | ICD-10-CM | POA: Diagnosis present

## 2015-12-30 DIAGNOSIS — M79605 Pain in left leg: Secondary | ICD-10-CM | POA: Diagnosis not present

## 2015-12-30 DIAGNOSIS — B3781 Candidal esophagitis: Secondary | ICD-10-CM | POA: Diagnosis present

## 2015-12-30 DIAGNOSIS — E871 Hypo-osmolality and hyponatremia: Secondary | ICD-10-CM | POA: Diagnosis present

## 2015-12-30 DIAGNOSIS — R0602 Shortness of breath: Secondary | ICD-10-CM

## 2015-12-30 DIAGNOSIS — K449 Diaphragmatic hernia without obstruction or gangrene: Secondary | ICD-10-CM | POA: Diagnosis present

## 2015-12-30 DIAGNOSIS — E1165 Type 2 diabetes mellitus with hyperglycemia: Secondary | ICD-10-CM | POA: Diagnosis not present

## 2015-12-30 DIAGNOSIS — J410 Simple chronic bronchitis: Secondary | ICD-10-CM

## 2015-12-30 DIAGNOSIS — K2211 Ulcer of esophagus with bleeding: Secondary | ICD-10-CM | POA: Diagnosis present

## 2015-12-30 DIAGNOSIS — Z992 Dependence on renal dialysis: Secondary | ICD-10-CM | POA: Diagnosis not present

## 2015-12-30 DIAGNOSIS — D631 Anemia in chronic kidney disease: Secondary | ICD-10-CM | POA: Diagnosis not present

## 2015-12-30 DIAGNOSIS — N186 End stage renal disease: Secondary | ICD-10-CM | POA: Diagnosis not present

## 2015-12-30 DIAGNOSIS — I120 Hypertensive chronic kidney disease with stage 5 chronic kidney disease or end stage renal disease: Secondary | ICD-10-CM | POA: Diagnosis not present

## 2015-12-30 DIAGNOSIS — R410 Disorientation, unspecified: Secondary | ICD-10-CM | POA: Diagnosis not present

## 2015-12-30 DIAGNOSIS — I509 Heart failure, unspecified: Secondary | ICD-10-CM | POA: Diagnosis not present

## 2015-12-30 DIAGNOSIS — N390 Urinary tract infection, site not specified: Secondary | ICD-10-CM | POA: Diagnosis present

## 2015-12-30 DIAGNOSIS — D62 Acute posthemorrhagic anemia: Secondary | ICD-10-CM | POA: Diagnosis not present

## 2015-12-30 DIAGNOSIS — I429 Cardiomyopathy, unspecified: Secondary | ICD-10-CM

## 2015-12-30 DIAGNOSIS — R935 Abnormal findings on diagnostic imaging of other abdominal regions, including retroperitoneum: Secondary | ICD-10-CM | POA: Diagnosis not present

## 2015-12-30 DIAGNOSIS — K269 Duodenal ulcer, unspecified as acute or chronic, without hemorrhage or perforation: Secondary | ICD-10-CM | POA: Diagnosis not present

## 2015-12-30 DIAGNOSIS — E162 Hypoglycemia, unspecified: Secondary | ICD-10-CM | POA: Diagnosis not present

## 2015-12-30 DIAGNOSIS — A4189 Other specified sepsis: Secondary | ICD-10-CM | POA: Diagnosis present

## 2015-12-30 DIAGNOSIS — E872 Acidosis: Secondary | ICD-10-CM | POA: Diagnosis present

## 2015-12-30 DIAGNOSIS — M898X9 Other specified disorders of bone, unspecified site: Secondary | ICD-10-CM | POA: Diagnosis present

## 2015-12-30 DIAGNOSIS — E11649 Type 2 diabetes mellitus with hypoglycemia without coma: Secondary | ICD-10-CM | POA: Diagnosis not present

## 2015-12-30 DIAGNOSIS — K2210 Ulcer of esophagus without bleeding: Secondary | ICD-10-CM | POA: Diagnosis not present

## 2015-12-30 DIAGNOSIS — K7689 Other specified diseases of liver: Secondary | ICD-10-CM | POA: Diagnosis present

## 2015-12-30 DIAGNOSIS — Z66 Do not resuscitate: Secondary | ICD-10-CM | POA: Diagnosis present

## 2015-12-30 DIAGNOSIS — R404 Transient alteration of awareness: Secondary | ICD-10-CM | POA: Diagnosis not present

## 2015-12-30 DIAGNOSIS — M79606 Pain in leg, unspecified: Secondary | ICD-10-CM

## 2015-12-30 DIAGNOSIS — R570 Cardiogenic shock: Secondary | ICD-10-CM | POA: Diagnosis not present

## 2015-12-30 DIAGNOSIS — E1122 Type 2 diabetes mellitus with diabetic chronic kidney disease: Secondary | ICD-10-CM | POA: Diagnosis present

## 2015-12-30 DIAGNOSIS — M109 Gout, unspecified: Secondary | ICD-10-CM | POA: Diagnosis present

## 2015-12-30 DIAGNOSIS — R197 Diarrhea, unspecified: Secondary | ICD-10-CM

## 2015-12-30 DIAGNOSIS — F17210 Nicotine dependence, cigarettes, uncomplicated: Secondary | ICD-10-CM | POA: Diagnosis present

## 2015-12-30 DIAGNOSIS — I10 Essential (primary) hypertension: Secondary | ICD-10-CM | POA: Diagnosis present

## 2015-12-30 DIAGNOSIS — F17200 Nicotine dependence, unspecified, uncomplicated: Secondary | ICD-10-CM | POA: Diagnosis present

## 2015-12-30 DIAGNOSIS — I352 Nonrheumatic aortic (valve) stenosis with insufficiency: Secondary | ICD-10-CM | POA: Diagnosis present

## 2015-12-30 DIAGNOSIS — I998 Other disorder of circulatory system: Secondary | ICD-10-CM | POA: Diagnosis not present

## 2015-12-30 DIAGNOSIS — E860 Dehydration: Secondary | ICD-10-CM | POA: Diagnosis present

## 2015-12-30 LAB — COMPREHENSIVE METABOLIC PANEL
ALBUMIN: 2.7 g/dL — AB (ref 3.5–5.0)
ALBUMIN: 2.4 g/dL — AB (ref 3.5–5.0)
ALT: 1023 U/L — ABNORMAL HIGH (ref 17–63)
ALT: 1978 U/L — ABNORMAL HIGH (ref 17–63)
AST: 1621 U/L — ABNORMAL HIGH (ref 15–41)
AST: 3750 U/L — AB (ref 15–41)
Alkaline Phosphatase: 120 U/L (ref 38–126)
Alkaline Phosphatase: 109 U/L (ref 38–126)
BILIRUBIN TOTAL: 1.5 mg/dL — AB (ref 0.3–1.2)
BILIRUBIN TOTAL: 1.2 mg/dL (ref 0.3–1.2)
BUN: 57 mg/dL — AB (ref 6–20)
BUN: 65 mg/dL — AB (ref 6–20)
CHLORIDE: 85 mmol/L — AB (ref 101–111)
CHLORIDE: 90 mmol/L — AB (ref 101–111)
CO2: 16 mmol/L — ABNORMAL LOW (ref 22–32)
CO2: 14 mmol/L — ABNORMAL LOW (ref 22–32)
CREATININE: 8.83 mg/dL — AB (ref 0.61–1.24)
Calcium: 9.3 mg/dL (ref 8.9–10.3)
Calcium: 8.3 mg/dL — ABNORMAL LOW (ref 8.9–10.3)
Creatinine, Ser: 9.15 mg/dL — ABNORMAL HIGH (ref 0.61–1.24)
GFR calc Af Amer: 6 mL/min — ABNORMAL LOW (ref >60)
GFR calc non Af Amer: 5 mL/min — ABNORMAL LOW (ref >60)
GFR calc non Af Amer: 5 mL/min — ABNORMAL LOW (ref >60)
GFR, EST AFRICAN AMERICAN: 6 mL/min — AB (ref >60)
GLUCOSE: 329 mg/dL — AB (ref 65–99)
Glucose, Bld: 568 mg/dL (ref 65–99)
POTASSIUM: 6.1 mmol/L — AB (ref 3.5–5.1)
Potassium: 6 mmol/L — ABNORMAL HIGH (ref 3.5–5.1)
SODIUM: 135 mmol/L (ref 135–145)
Sodium: 130 mmol/L — ABNORMAL LOW (ref 135–145)
Total Protein: 6.2 g/dL — ABNORMAL LOW (ref 6.5–8.1)
Total Protein: 5.4 g/dL — ABNORMAL LOW (ref 6.5–8.1)

## 2015-12-30 LAB — CBC WITH DIFFERENTIAL/PLATELET
Basophils Absolute: 0 10*3/uL (ref 0.0–0.1)
Basophils Relative: 0 %
EOS PCT: 0 %
Eosinophils Absolute: 0 10*3/uL (ref 0.0–0.7)
HEMATOCRIT: 31.1 % — AB (ref 39.0–52.0)
Hemoglobin: 10.1 g/dL — ABNORMAL LOW (ref 13.0–17.0)
LYMPHS ABS: 0.4 10*3/uL — AB (ref 0.7–4.0)
LYMPHS PCT: 2 %
MCH: 31.8 pg (ref 26.0–34.0)
MCHC: 32.5 g/dL (ref 30.0–36.0)
MCV: 97.8 fL (ref 78.0–100.0)
Monocytes Absolute: 1.5 10*3/uL — ABNORMAL HIGH (ref 0.1–1.0)
Monocytes Relative: 7 %
NEUTROS ABS: 20.6 10*3/uL — AB (ref 1.7–7.7)
Neutrophils Relative %: 91 %
PLATELETS: 223 10*3/uL (ref 150–400)
RBC: 3.18 MIL/uL — AB (ref 4.22–5.81)
RDW: 17.7 % — ABNORMAL HIGH (ref 11.5–15.5)
WBC: 22.5 10*3/uL — AB (ref 4.0–10.5)

## 2015-12-30 LAB — GLUCOSE, CAPILLARY
GLUCOSE-CAPILLARY: 317 mg/dL — AB (ref 65–99)
Glucose-Capillary: 359 mg/dL — ABNORMAL HIGH (ref 65–99)
Glucose-Capillary: 501 mg/dL (ref 65–99)

## 2015-12-30 LAB — C DIFFICILE QUICK SCREEN W PCR REFLEX
C DIFFICILE (CDIFF) INTERP: NOT DETECTED
C DIFFICILE (CDIFF) TOXIN: NEGATIVE
C DIFFICLE (CDIFF) ANTIGEN: NEGATIVE

## 2015-12-30 LAB — CBC
HCT: 27.9 % — ABNORMAL LOW (ref 39.0–52.0)
Hemoglobin: 8.9 g/dL — ABNORMAL LOW (ref 13.0–17.0)
MCH: 30.7 pg (ref 26.0–34.0)
MCHC: 31.9 g/dL (ref 30.0–36.0)
MCV: 96.2 fL (ref 78.0–100.0)
PLATELETS: 169 10*3/uL (ref 150–400)
RBC: 2.9 MIL/uL — ABNORMAL LOW (ref 4.22–5.81)
RDW: 17.6 % — AB (ref 11.5–15.5)
WBC: 24.3 10*3/uL — AB (ref 4.0–10.5)

## 2015-12-30 LAB — MRSA PCR SCREENING: MRSA by PCR: NEGATIVE

## 2015-12-30 LAB — PROTIME-INR
INR: 2.4
PROTHROMBIN TIME: 26.6 s — AB (ref 11.4–15.2)

## 2015-12-30 LAB — LACTIC ACID, PLASMA
LACTIC ACID, VENOUS: 15.9 mmol/L — AB (ref 0.5–1.9)
Lactic Acid, Venous: 13.6 mmol/L (ref 0.5–1.9)
Lactic Acid, Venous: 12 mmol/L (ref 0.5–1.9)

## 2015-12-30 LAB — I-STAT CHEM 8, ED
BUN: 59 mg/dL — ABNORMAL HIGH (ref 6–20)
CHLORIDE: 95 mmol/L — AB (ref 101–111)
Calcium, Ion: 0.91 mmol/L — ABNORMAL LOW (ref 1.15–1.40)
Creatinine, Ser: 9.4 mg/dL — ABNORMAL HIGH (ref 0.61–1.24)
Glucose, Bld: 37 mg/dL — CL (ref 65–99)
HEMATOCRIT: 36 % — AB (ref 39.0–52.0)
Hemoglobin: 12.2 g/dL — ABNORMAL LOW (ref 13.0–17.0)
POTASSIUM: 6 mmol/L — AB (ref 3.5–5.1)
SODIUM: 133 mmol/L — AB (ref 135–145)
TCO2: 18 mmol/L (ref 0–100)

## 2015-12-30 LAB — CBG MONITORING, ED
GLUCOSE-CAPILLARY: 269 mg/dL — AB (ref 65–99)
GLUCOSE-CAPILLARY: 36 mg/dL — AB (ref 65–99)
GLUCOSE-CAPILLARY: 354 mg/dL — AB (ref 65–99)
GLUCOSE-CAPILLARY: 381 mg/dL — AB (ref 65–99)
Glucose-Capillary: 296 mg/dL — ABNORMAL HIGH (ref 65–99)
Glucose-Capillary: 288 mg/dL — ABNORMAL HIGH (ref 65–99)

## 2015-12-30 LAB — ACETAMINOPHEN LEVEL: Acetaminophen (Tylenol), Serum: <10 ug/mL — ABNORMAL LOW (ref 10–30)

## 2015-12-30 LAB — I-STAT CG4 LACTIC ACID, ED

## 2015-12-30 MED ORDER — EPOETIN ALFA 4000 UNIT/ML IJ SOLN
4000.0000 [IU] | INTRAMUSCULAR | Status: DC
  Filled 2015-12-30 (×2): qty 1

## 2015-12-30 MED ORDER — ALTEPLASE 2 MG IJ SOLR
2.0000 mg | Freq: Once | INTRAMUSCULAR | Status: DC | PRN
  Filled 2015-12-30: qty 2

## 2015-12-30 MED ORDER — LIDOCAINE-PRILOCAINE 2.5-2.5 % EX CREA
1.0000 "application " | TOPICAL_CREAM | CUTANEOUS | Status: DC | PRN
  Filled 2015-12-30: qty 5

## 2015-12-30 MED ORDER — PENTAFLUOROPROP-TETRAFLUOROETH EX AERO
1.0000 "application " | INHALATION_SPRAY | CUTANEOUS | Status: DC | PRN
  Filled 2015-12-30: qty 30

## 2015-12-30 MED ORDER — DEXTROSE 50 % IV SOLN
50.0000 mL | INTRAVENOUS | Status: DC | PRN
  Administered 2016-01-01 – 2016-01-02 (×3): 50 mL via INTRAVENOUS

## 2015-12-30 MED ORDER — METOPROLOL SUCCINATE ER 50 MG PO TB24
50.0000 mg | ORAL_TABLET | Freq: Every day | ORAL | Status: DC
  Administered 2015-12-31 – 2016-01-05 (×5): 50 mg via ORAL
  Filled 2015-12-30 (×5): qty 1

## 2015-12-30 MED ORDER — ONDANSETRON HCL 4 MG/2ML IJ SOLN
4.0000 mg | Freq: Four times a day (QID) | INTRAMUSCULAR | Status: DC | PRN
  Administered 2015-12-31 (×2): 4 mg via INTRAVENOUS
  Filled 2015-12-30 (×2): qty 2

## 2015-12-30 MED ORDER — SODIUM CHLORIDE 0.9 % IV SOLN: 100.0000 mL | INTRAVENOUS | Status: DC | PRN

## 2015-12-30 MED ORDER — PIPERACILLIN-TAZOBACTAM 3.375 G IVPB 30 MIN
3.3750 g | INTRAVENOUS | Status: AC
  Administered 2015-12-30: 3.375 g via INTRAVENOUS
  Filled 2015-12-30: qty 50

## 2015-12-30 MED ORDER — DEXTROSE 5 % IV SOLN: INTRAVENOUS | Status: DC

## 2015-12-30 MED ORDER — DEXTROSE 50 % IV SOLN
50.0000 mL | Freq: Once | INTRAVENOUS | Status: AC
  Administered 2015-12-30: 50 mL via INTRAVENOUS

## 2015-12-30 MED ORDER — ASPIRIN EC 81 MG PO TBEC
81.0000 mg | DELAYED_RELEASE_TABLET | Freq: Every day | ORAL | Status: DC
  Administered 2015-12-30 – 2016-01-01 (×3): 81 mg via ORAL
  Filled 2015-12-30 (×3): qty 1

## 2015-12-30 MED ORDER — HYDROCODONE-ACETAMINOPHEN 5-325 MG PO TABS
1.0000 | ORAL_TABLET | Freq: Four times a day (QID) | ORAL | Status: DC | PRN
  Administered 2015-12-30 – 2016-01-01 (×2): 1 via ORAL
  Filled 2015-12-30 (×2): qty 1

## 2015-12-30 MED ORDER — DEXTROSE 5 % AND 0.45 % NACL IV BOLUS: 500.0000 mL | Freq: Once | INTRAVENOUS | Status: DC

## 2015-12-30 MED ORDER — SODIUM CHLORIDE 0.9 % IV BOLUS (SEPSIS)
500.0000 mL | Freq: Once | INTRAVENOUS | Status: AC
  Administered 2015-12-30: 500 mL via INTRAVENOUS

## 2015-12-30 MED ORDER — ONDANSETRON HCL 4 MG PO TABS: 4.0000 mg | ORAL_TABLET | Freq: Four times a day (QID) | ORAL | Status: DC | PRN

## 2015-12-30 MED ORDER — VANCOMYCIN HCL IN DEXTROSE 1-5 GM/200ML-% IV SOLN
1000.0000 mg | Freq: Once | INTRAVENOUS | Status: AC
  Administered 2015-12-30: 1000 mg via INTRAVENOUS
  Filled 2015-12-30: qty 200

## 2015-12-30 MED ORDER — SODIUM CHLORIDE 0.9 % IV BOLUS (SEPSIS)
1000.0000 mL | Freq: Once | INTRAVENOUS | Status: AC
  Administered 2015-12-30: 1000 mL via INTRAVENOUS

## 2015-12-30 MED ORDER — CALCIUM ACETATE (PHOS BINDER) 667 MG PO CAPS
667.0000 mg | ORAL_CAPSULE | Freq: Every day | ORAL | Status: DC
  Administered 2015-12-31 – 2016-01-05 (×3): 667 mg via ORAL
  Filled 2015-12-30 (×3): qty 1

## 2015-12-30 MED ORDER — PIPERACILLIN-TAZOBACTAM 3.375 G IVPB
3.3750 g | Freq: Two times a day (BID) | INTRAVENOUS | Status: DC
  Administered 2015-12-30 – 2016-01-04 (×10): 3.375 g via INTRAVENOUS
  Filled 2015-12-30 (×9): qty 50

## 2015-12-30 MED ORDER — LIDOCAINE-PRILOCAINE 2.5-2.5 % EX CREA
TOPICAL_CREAM | Freq: Once | CUTANEOUS | Status: DC
  Filled 2015-12-30: qty 5

## 2015-12-30 MED ORDER — LIDOCAINE HCL (PF) 1 % IJ SOLN: 5.0000 mL | INTRAMUSCULAR | Status: DC | PRN

## 2015-12-30 MED ORDER — HEPARIN SODIUM (PORCINE) 5000 UNIT/ML IJ SOLN
5000.0000 [IU] | Freq: Three times a day (TID) | INTRAMUSCULAR | Status: DC
  Administered 2015-12-30 – 2015-12-31 (×3): 5000 [IU] via SUBCUTANEOUS
  Filled 2015-12-30 (×3): qty 1

## 2015-12-30 MED ORDER — VANCOMYCIN 50 MG/ML ORAL SOLUTION
500.0000 mg | Freq: Four times a day (QID) | ORAL | Status: DC
  Filled 2015-12-30 (×8): qty 10

## 2015-12-30 MED ORDER — HEPARIN SODIUM (PORCINE) 1000 UNIT/ML DIALYSIS
1000.0000 [IU] | INTRAMUSCULAR | Status: DC | PRN
  Filled 2015-12-30: qty 1

## 2015-12-30 MED ORDER — INSULIN ASPART 100 UNIT/ML ~~LOC~~ SOLN
0.0000 [IU] | Freq: Three times a day (TID) | SUBCUTANEOUS | Status: DC
  Administered 2015-12-30: 5 [IU] via SUBCUTANEOUS
  Administered 2015-12-31: 2 [IU] via SUBCUTANEOUS
  Administered 2015-12-31: 5 [IU] via SUBCUTANEOUS

## 2015-12-30 MED ORDER — BOOST / RESOURCE BREEZE PO LIQD
1.0000 | Freq: Three times a day (TID) | ORAL | Status: DC
  Administered 2015-12-31 (×2): 1 via ORAL

## 2015-12-30 NOTE — Consult Note (Signed)
Referring Provider: Lala Lund, MD Primary Care Physician:  Odette Fraction, MD Primary Gastroenterologist:  Dr. Laural Golden  Reason for Consultation:    Elevated transaminases.  HPI:   Patient is 70 year old African-American male with multiple medical problems including valvular disease and nonischemic cardiomyopathy who was in usual state of health until last week when he was diagnosed with sinusitis and given prescription for amoxicillin. He started this prescription on 12/26/2015. 2 days ago he developed lower abdominal cramping and nonbloody diarrhea. Yesterday he felt bad. According to his wife his intake was very poor. He had 4 or 5 stools during the daytime and another 5 or 6 during the night. He also experience nausea and vomiting. He vomited clear fluid. He felt warm last night. According to his wife he was delirious last night. His speech did not make any sense. She called their granddaughter who came and checked him this morning and his glucose was 32. He was drowsy. He was given by mouth sugar and glucose came up to 40 but dropped again. At this point EMS was called. He was given honey peanut butter and crackers. Glucose only came up to 45. Patient was brought to emergency room. He was treated with glucagon and IV dextrose. Evaluation in emergency room revealed him to have leukocytosis and markedly elevated transaminases. Unenhanced head CT revealed atrophy with patchy. He went for small vessel disease but no evidence of mass or hemorrhage. Dominant pelvic CT without contrast revealed knee mobilia and stomach full of fluid. He also had stable benign-appearing second rod lesion in left femur. Cultures were drawn and patient was begun on Zosyn and admitted to ICU. Glucose has come up to 381. Patient is able to provide history. He denies nausea vomiting chest pain or shortness of breath. He is not having abdominal pain at this time. Since coming to Hospital he's had 3 loose stools. Stool  sample has been sent to the lab for quick C. difficile scan. Patient denies melena or rectal bleeding. He also denies chills. Despite states he was warm yesterday but she did not check his temperature. Patient has not taken course of antibiotics in the last 3 months other than few is of amoxicillin. Patient was supposed to get dialysis today but was not able to do so.       Past Medical History:  Diagnosis Date  . Adenomatous colon polyp 2006  . AICD (automatic cardioverter/defibrillator) present    STJ device, implanted 12/03/14 Dr. Lovena Le  . Arthritis    Gout  . Benign prostatic hypertrophy   . CHF (congestive heart failure) ([place])   . Chronic combined systolic and diastolic CHF (congestive heart failure) ([place])    a. 02/2014 Echo: EF 20-25% (new), Gr 2 DD, mod conc LVH, mild AI/AS, mod MR, sev dil LA, mild TR.  Marland Kitchen Chronic kidney disease (CKD), stage IV (severe) ([place]) 12/2009   a. baseline creat now ~ 4.  .    .    . 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 Erectile dysfunction   . Gout   . H. pylori infection 2013   treated with prevpac  . Hyperlipidemia   . Hyperparathyroidism ([place])   . Hypertension   . Low serum testosterone level   . Mild Ao Stenosis and Insufficiency   . Nonischemic cardiomyopathy ([place])    a. 02/2014 Echo: EF 20-25%;  b. 03/2014 Cath: LM nl, LAD min irregs, LCX   . Obesity, Class I, BMI  30-34.9       . Sleep apnea 2010   does not use CPAP  . Tobacco abuse     Past Surgical History:  Procedure Laterality Date  . AV FISTULA PLACEMENT Right 04/02/2015   Procedure: RIGHT ARM RADIOCEPHALIC ARTERIOVENOUS (AV) FISTULA CREATION;  Surgeon: Conrad Ashley, MD;  Location: [facility];  Service: Vascular;  Laterality: Right;  . CARDIAC CATHETERIZATION    . CHOLECYSTECTOMY N/A 01/07/2015   Procedure: LAPAROSCOPIC CHOLECYSTECTOMY WITH INTRAOPERATIVE CHOLANGIOGRAM;  Surgeon: Donnie Mesa, MD;  Location: [facility];  Service: General;  Laterality: N/A;   . COLONOSCOPY  08/23/2004   Dr. Vivi Ferns rectum, diminutive polyp of the rectosigmoid removed, inflamed focally adenomatous polyp  . COLONOSCOPY  2013   Dr. Gala Romney: multiple diminutive polyps in distal sigmoid segment, 4 mm pedunculated polyp at hepatic flexure. Tubular adenomas. Surveillance due 2018   . EP IMPLANTABLE DEVICE N/A 12/03/2014   Procedure: ICD Implant;  Surgeon: Evans Lance, MD;  Location: [facility];  Service: Cardiovascular;  Laterality: N/A;; St Jude  . ERCP N/A 01/09/2015   Procedure: ENDOSCOPIC RETROGRADE CHOLANGIOPANCREATOGRAPHY (ERCP);  Surgeon: Clarene Essex, MD;  Location: [facility] ENDOSCOPY;  Service: Endoscopy;  Laterality: N/A;  . ESOPHAGOGASTRODUODENOSCOPY  2013   Dr. Gala Romney: erosions reminiscent of GAVE but biopsies showed H.pylori gastritis  . INSERTION OF DIALYSIS CATHETER Right 04/02/2015   Procedure: INSERTION OF DIALYSIS CATHETER - RIGHT INTERNAL JUGULAR;  Surgeon: Conrad [place] Piedras, MD;  Location: [facility];  Service: Vascular;  Laterality: Right;  . LEFT AND RIGHT HEART CATHETERIZATION WITH CORONARY ANGIOGRAM N/A 03/19/2014   Procedure: LEFT AND RIGHT HEART CATHETERIZATION WITH CORONARY ANGIOGRAM;  Surgeon: Blane Ohara, MD;  Location: [facility] CATH LAB;  Service: Cardiovascular;  Laterality: N/A;  . LYMPH NODE BIOPSY     surgical exploration of neck-not entirely clear that this represented a lymph node biopsy  . RETINAL LASER PROCEDURE     diabetic retinopathy  . UMBILICAL HERNIA REPAIR N/A 01/07/2015   Procedure: HERNIA REPAIR UMBILICAL ADULT;  Surgeon: Donnie Mesa, MD;  Location: [place];  Service: General;  Laterality: N/A;    Prior to Admission medications   Medication Sig Start Date End Date Taking? Authorizing Provider  albuterol (PROVENTIL) (2.5 MG/3ML) 0.083% nebulizer solution as directed. 12/07/15  Yes Historical Provider, MD  allopurinol (ZYLOPRIM) 100 MG tablet Take 1 tablet (100 mg total) by mouth daily. 11/11/15  Yes Susy Frizzle, MD  aspirin EC  81 MG tablet Take 81 mg by mouth daily.   Yes Historical Provider, MD  calcium acetate (PHOSLO) 667 MG capsule Take 1 capsule (667 mg total) by mouth daily. Take with the biggest meal. 06/05/15  Yes Susy Frizzle, MD  Cholecalciferol (VITAMIN D PO) Take 1 capsule by mouth daily.   Yes Historical Provider, MD  lidocaine-prilocaine (EMLA) cream as directed.  06/19/15  Yes Historical Provider, MD  metoprolol succinate (TOPROL-XL) 50 MG 24 hr tablet Take 1 tablet by mouth daily. 10/29/15  Yes Historical Provider, MD  ONE TOUCH ULTRA TEST test strip  06/02/15  Yes Historical Provider, MD  promethazine (PHENERGAN) 12.5 MG tablet Take 1 tablet (12.5 mg total) by mouth every 6 (six) hours as needed for nausea or vomiting. 12/29/15  Yes Alycia Rossetti, MD  tiZANidine (ZANAFLEX) 4 MG capsule Take 1 capsule (4 mg total) by mouth 3 (three) times daily as needed for muscle spasms. 12/25/15  Yes Susy Frizzle, MD  amoxicillin (AMOXIL) 875 MG tablet  12/25/15  Historical Provider, MD    Current Facility-Administered Medications  Medication Dose Route Frequency Provider Last Rate Last Dose  . 0.9 %  sodium chloride infusion  100 mL Intravenous PRN Manpreet Toya Smothers, MD      . 0.9 %  sodium chloride infusion  100 mL Intravenous PRN Manpreet Toya Smothers, MD      . alteplase (CATHFLO ACTIVASE) injection 2 mg  2 mg Intracatheter Once PRN Liana Gerold, MD      . aspirin EC tablet 81 mg  81 mg Oral Daily Thurnell Lose, MD      . Derrill Memo ON 12/31/2015] calcium acetate (PHOSLO) capsule 667 mg  667 mg Oral Q lunch Thurnell Lose, MD      . dextrose 5 % solution   Intravenous Continuous Thurnell Lose, MD      . dextrose 50 % solution 50 mL  50 mL Intravenous Q4H PRN Thurnell Lose, MD      . Derrill Memo ON 01/01/2016] epoetin alfa (EPOGEN,PROCRIT) injection 4,000 Units  4,000 Units Subcutaneous Q M,W,F-HD Manpreet Toya Smothers, MD      . heparin injection 1,000 Units  1,000 Units Dialysis PRN Manpreet Toya Smothers,  MD      . heparin injection 5,000 Units  5,000 Units Subcutaneous Q8H Thurnell Lose, MD      . HYDROcodone-acetaminophen (NORCO/VICODIN) 5-325 MG per tablet 1 tablet  1 tablet Oral Q6H PRN Thurnell Lose, MD      . lidocaine (PF) (XYLOCAINE) 1 % injection 5 mL  5 mL Intradermal PRN Manpreet Toya Smothers, MD      . lidocaine-prilocaine (EMLA) cream 1 application  1 application Topical PRN Manpreet Toya Smothers, MD      . lidocaine-prilocaine (EMLA) cream   Topical Once Thurnell Lose, MD      . Derrill Memo ON 12/31/2015] metoprolol succinate (TOPROL-XL) 24 hr tablet 50 mg  50 mg Oral Daily Thurnell Lose, MD      . ondansetron (ZOFRAN) tablet 4 mg  4 mg Oral Q6H PRN Thurnell Lose, MD       Or  . ondansetron (ZOFRAN) injection 4 mg  4 mg Intravenous Q6H PRN Thurnell Lose, MD      . pentafluoroprop-tetrafluoroeth (GEBAUERS) aerosol 1 application  1 application Topical PRN Manpreet Toya Smothers, MD      . piperacillin-tazobactam (ZOSYN) IVPB 3.375 g  3.375 g Intravenous Q12H Nat Christen, MD      . vancomycin (VANCOCIN) 50 mg/mL oral solution 500 mg  500 mg Oral Q6H Rogene Houston, MD        Allergies as of 12/23/2015 - Review Complete 01/05/2016  Allergen Reaction Noted  . Ace inhibitors Cough     Family History  Problem Relation Age of Onset  . Hypertension Mother   . Cancer Mother   . Cancer Father   . Diabetes Sister     Social History   Social History  . Marital status: Married    Spouse name: N/A  . Number of children: 4  . Years of education: N/A   Occupational History  . Semi - Retired Administrator    Social History Main Topics  . Smoking status: Current Some Day Smoker    Packs/day: 0.25    Years: 56.00    Types: Cigarettes    Start date: 05/31/1958  . Smokeless tobacco: Never Used  . Alcohol use No  . Drug use: No  . Sexual activity:  Not Currently   Other Topics Concern  . Not on file   Social History Narrative  . No narrative on file    Review of  Systems: See HPI, otherwise normal ROS  Physical Exam: Temp:  [95.2 F (35.1 C)-97 F (36.1 C)] 97 F (36.1 C) (12/13 1358) Pulse Rate:  [66-86] 70 (12/13 1358) Resp:  [19-26] 26 (12/13 1358) BP: (105-127)/(40-80) 126/54 (12/13 1358) SpO2:  [97 %-100 %] 100 % (12/13 1358) Weight:  [182 lb (82.6 kg)] 182 lb (82.6 kg) (12/13 1016)   Patient is alert and appears to be in no acute distress. He responds appropriately to questions. Conjunctiva is pink. Sclerae nonicteric. Oropharyngeal mucosa is normal. No neck masses or thyromegaly noted. JVD does not appear to be elevated. Cardiac exam with regular rhythm normal S1 and S2. Grade 2/6 systolic ejection murmur heard at left sternal border as well as aortic area. Auscultation of lungs revealed few crackles at right base. Abdomen is full. Small umbilicus hernia noted which is completely reducible. Bowel sounds are normal. On palpation abdomen is soft and nontender without organomegaly or masses. Peripheral edema or clubbing noted.  Lab Results:  Recent Labs  01/05/2016 0951 12/18/2015 1013  WBC  --  22.5*  HGB 12.2* 10.1*  HCT 36.0* 31.1*  PLT  --  223   BMET  Recent Labs  01/15/2016 0951 01/13/2016 1013  NA 133* 135  K 6.0* 6.1*  CL 95* 85*  CO2  --  16*  GLUCOSE 37* 329*  BUN 59* 57*  CREATININE 9.40* 9.15*  CALCIUM  --  9.3   LFT  Recent Labs  01/12/2016 1013  PROT 6.2*  ALBUMIN 2.7*  AST 1,621*  ALT 1,023*  ALKPHOS 120  BILITOT 1.5*    Studies/Results: Ct Abdomen Pelvis Wo Contrast  Result Date: 01/02/2016 CLINICAL DATA:  They were called out for unconscious. Upon arrival they took patients blood sugar and it was 23; sepsis; elevated liver enzymes; ams EXAM: CT ABDOMEN AND PELVIS WITHOUT CONTRAST TECHNIQUE: Multidetector CT imaging of the abdomen and pelvis was performed following the standard protocol without IV contrast. COMPARISON:  CT 01/03/2015 FINDINGS: Lower chest: Lung bases are clear. Hepatobiliary: 3.6 cm  cyst in the LEFT lateral hepatic lobe. Pneumobilia in common bile duct and LEFT hepatic lobe. Interval cholecystectomy. No biliary dilatation. Pancreas: Pancreas is normal. No ductal dilatation. No pancreatic inflammation. Spleen: Normal spleen Adrenals/urinary tract: Adrenal glands are mildly thickened. There are several simple fluid attenuation lesions associated with the kidneys. No ureterolithiasis or obstructive uropathy. Foley catheter within collapsed bladder. Stomach/Bowel: Stomach is distended by fluid. No obstructing lesions identified. The small bowel and cecum are normal. The appendix is normal. The colon and rectosigmoid colon are normal. Vascular/Lymphatic: Abdominal aorta is normal caliber with atherosclerotic calcification. There is no retroperitoneal or periportal lymphadenopathy. No pelvic lymphadenopathy. Reproductive: Prostate normal Other: No free fluid. Musculoskeletal: Round well-circumscribed lesion in the LEFT femoral neck has a thin sclerotic rim appears benign. No change from comparison examNo aggressive osseous lesion. IMPRESSION: 1. Large volume of fluid within the stomach suggests gastroparesis 2. Pneumobilia is new from comparison exam. Interval cholecystectomy and presumably a sphincterotomy. 3.  Atherosclerotic calcification of the aorta. 4. Foley catheter in bladder. 5. Stable benign-appearing sclerotic lesion in the LEFT femur. Electronically Signed   By: Suzy Bouchard M.D.   On: 01/09/2016 12:10   Ct Head Wo Contrast  Result Date: 01/05/2016 CLINICAL DATA:  Altered mental status.  Hypoglycemia. EXAM: CT HEAD WITHOUT  CONTRAST TECHNIQUE: Contiguous axial images were obtained from the base of the skull through the vertex without intravenous contrast. COMPARISON:  None. FINDINGS: Brain: There is mild to moderate diffuse atrophy. There is no intracranial mass, hemorrhage, extra-axial fluid collection, or midline shift. There is patchy small vessel disease in the centra  semiovale bilaterally, primarily posteriorly and superiorly. Elsewhere gray-white compartments appear unremarkable. No acute infarct is evident. There is calcification in each basal ganglia region, a physiologic finding in this age group. Vascular: There is no hyperdense vessel. There are foci of calcification in each carotid siphon region. Skull: The bony calvarium appears intact. Sinuses/Orbits: There is a retention cyst in the left maxillary antrum. There is mild mucosal thickening in several ethmoid air cells bilaterally. Other visualized paranasal sinuses are clear. Orbits appear symmetric bilaterally. Other: There is opacification of several inferior mastoid air cells on the left. Visualized mastoid air cells elsewhere appear clear. There is mild debris in each external auditory canal. IMPRESSION: Atrophy with patchy periventricular small vessel disease. No intracranial mass, hemorrhage, or extra-axial fluid collection. No acute infarct evident. Basal ganglia calcification bilaterally is physiologic in this age group. Foci of arterial vascular calcification noted. Mild left-sided mastoid air cell disease. Probable cerumen in each external auditory canal. Foci of paranasal sinus disease at several sites. Electronically Signed   By: Lowella Grip III M.D.   On: 01/14/2016 11:00   Dg Chest Port 1 View  Result Date: 01/14/2016 CLINICAL DATA:  Sepsis EXAM: PORTABLE CHEST 1 VIEW COMPARISON:  12/27/2015 FINDINGS: Cardiac shadow is enlarged. A defibrillator is again noted and stable. The lungs are well aerated bilaterally without focal infiltrate or sizable effusion. No acute bony abnormality is seen. IMPRESSION: No active disease. Electronically Signed   By: Inez Catalina M.D.   On: 12/26/2015 10:42   I have reviewed patient's chest film and abdominopelvic CT.   Assessment;  Acute diarrhea most likely secondary to C. difficile colitis. Diarrhea started after he had been on amoxicillin. Stool studies  pending. patient needs to be on by mouth vancomycin ASAP.  Elevated transaminases most likely due to ischemic injury. acute congestive hepatopathy can also result in significant elevation of transaminases but he does not appear to be in pulmonary edema or CHF.  Pneumobilia secondary to prior sphincterotomy and not indicative of cholangitis.  Anemia appears to be due to chronic disease. No evidence of overt GI bleed. Will monitor for any evidence of bleeding.  Recommendations;  Begin vancomycin 500 mg by mouth every 6 hours. Enteric precautions. Begin patient on clear liquids. Repeat LFTs in the a.m.   LOS: 0 days   Elaine Roanhorse  12/22/2015, 3:15 PM

## 2015-12-30 NOTE — Progress Notes (Signed)
Spoke to Dr. Candiss Norse about patient's blood sugar of 501. He is concerned about patient blood sugar dropping if we give him too much insulin. He wanted just a one time dose on Novolog 5 units and changed blood sugar checks to every 6 hours. Will continue to monitor.

## 2015-12-30 NOTE — Consult Note (Signed)
[name] MRN: [id number] DOB/AGE: 10-01-1945 70 y.o. Primary Care Physician:[name],[name], MD Admit date: 01/16/2016 Chief Complaint:  Chief Complaint  Patient presents with  . Hypoglycemia   HPI: Pt is 70 year old [place] American male with past medical hx of ESRD who was brought to ER with c/o AMS.  HPI dates back to to this morning when EMS was called secondary to AMS. Upon arrival blood glucose was checked and pt was found to be hypoglycemic with BS of  23. Pt was later brought to ER. In Pt pt was found to be hypotensive and hypothermic Pt is being admitted for sepsis. Pt in on dialysis with Monday/Wednesday and Friday schedule as outpt. Pt is now more alert and offer no complaints NO c/o chest pain No c/o fever/cough Pt data review does show hx of abdominal pain and diarrhea at this time is not offering any complaints.     Past Medical History:  Diagnosis Date  . Adenomatous colon polyp 2006  . AICD (automatic cardioverter/defibrillator) present    STJ device, implanted 12/03/14 Dr. Lovena Le  . Arthritis    Gout  . Benign prostatic hypertrophy   . CHF (congestive heart failure) ([place])   . Chronic combined systolic and diastolic CHF (congestive heart failure) ([place])    a. 02/2014 Echo: EF 20-25% (new), Gr 2 DD, mod conc LVH, mild AI/AS, mod MR, sev dil LA, mild TR.  Marland Kitchen Chronic kidney disease (CKD), stage IV (severe) ([place]) 12/2009   a. baseline creat now ~ 4.  . Colonic polyp   . Constipation   . 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 Erectile dysfunction   . Gout   . H. pylori infection 2013   treated with prevpac  . Hyperlipidemia   . Hyperparathyroidism ([place])   . Hypertension   . Low serum testosterone level   . Mild Ao Stenosis and Insufficiency   . Nonischemic cardiomyopathy ([place])    a. 02/2014 Echo: EF 20-25%;  b. 03/2014 Cath: LM nl, LAD min irregs, LCX   . Obesity, Class I, BMI 30-34.9   . Shortness of breath dyspnea   . Sleep  apnea 2010   does not use CPAP  . Tobacco abuse         Family History  Problem Relation Age of Onset  . Hypertension Mother   . Cancer Mother   . Cancer Father   . Diabetes Sister     Social History:  reports that he has been smoking Cigarettes.  He started smoking about 57 years ago. He has a 14.00 pack-year smoking history. He has never used smokeless tobacco. He reports that he does not drink alcohol or use drugs.   Allergies:  Allergies  Allergen Reactions  . Ace Inhibitors Cough     (Not in a hospital admission)     [id number] from the symptoms mentioned above,there are no other symptoms referable to all systems reviewed.  Physical Exam: Vital signs in last 24 hours: Temp:  [98.2 F (36.8 C)] 98.2 F (36.8 C) (12/12 1510) Pulse Rate:  [82-86] 86 (12/13 0941) Resp:  [18-26] 26 (12/13 0941) BP: (116-144)/(40-70) 116/40 (12/13 0941) SpO2:  [95 %] 95 % (12/12 1510) Weight:  [182 lb (82.6 kg)] 182 lb (82.6 kg) (12/13 1016) Weight change:     Intake/Output from previous day: No intake/output data recorded. No intake/output data recorded.   Physical Exam: General- pt is awake,alert, oriented to time place and  person Resp- No acute REsp distress, CTA B/L NO Rhonchi CVS- S1S2 regular ij rate and rhythm GIT- BS+, soft, ND EXT- NO LE Edema, Cyanosis CNS- CN 2-12 grossly intact. Moving all 4 extremities Psych- normal mod and affect Access- AVF   Lab Results: CBC  Recent Labs  12/23/2015 0951 01/11/2016 1013  WBC  --  22.5*  HGB 12.2* 10.1*  HCT 36.0* 31.1*  PLT  --  223    BMET  Recent Labs  01/17/2016 0951 12/19/2015 1013  NA 133* 135  K 6.0* 6.1*  CL 95* 85*  CO2  --  16*  GLUCOSE 37* 329*  BUN 59* 57*  CREATININE 9.40* 9.15*  CALCIUM  --  9.3    MICRO Recent Results (from the past 240 hour(s))  Blood Culture (routine x 2)     Status: None (Preliminary result)   Collection Time: 01/08/2016 10:13 AM  Result Value Ref Range Status    Specimen Description BLOOD LEFT ARM  Final   Special Requests BOTTLES DRAWN AEROBIC AND ANAEROBIC 5 CC EACH  Final   Culture PENDING  Incomplete   Report Status PENDING  Incomplete  Blood Culture (routine x 2)     Status: None (Preliminary result)   Collection Time: 12/31/2015 10:13 AM  Result Value Ref Range Status   Specimen Description BLOOD LEFT ARM  Final   Special Requests BOTTLES DRAWN AEROBIC AND ANAEROBIC 7 CC EACH  Final   Culture PENDING  Incomplete   Report Status PENDING  Incomplete      Lab Results  Component Value Date   PTH 372 (H) 01/08/2015   CALCIUM 9.3 12/25/2015   CAION 0.91 (L) 12/22/2015   PHOS 5.5 (H) 03/31/2015      Impression: 1)Renal  ESRD on HD                Pt is on Mon/Wed/Friday schedule              2)CVS- pt was hypotensive earlier, now bp better   3)Anemia IN ESRD the goal for HGB is 9--11     Pt HGb is at goal     Will keep on epo   4)CKD Mineral-Bone Disorder PTH acceptable  Secondary Hyperparathyroidism present  Phosphorus at goal.   5)ID-admitted with sepsis/septic shock On broad spectrum ABX Primary  MD following Will suggest to check Cdiff   6)Electrolytes  Hyperkalemic     Sec to ESRD   Hyponatremic    Now better -sec to ESRD  7)Acid base Co2 not at goal High AG acidosis sec to lactic acidosis On IVF    Plan:  Will dialyze with 2 k bath Will not do any fluid removal as bp on lower side, no edema Will use lower BFR Will keep on epo Pt may need Hd again in am depending upom the volume status and potassium       Duong Haydel S 12/23/2015, 11:05 AM

## 2015-12-30 NOTE — ED Provider Notes (Signed)
[facility] DEPT Provider Note   CSN: [id number] Arrival date & time: 01/09/2016  [id number]  By signing my name below, I, Hansel Feinstein, attest that this documentation has been prepared under the direction and in the presence of Nat Christen, MD. Electronically Signed: Hansel Feinstein, ED Scribe. 01/09/2016. 10:07 AM.     History   Chief Complaint Chief Complaint  Patient presents with  . Hypoglycemia   LEVEL 5 CAVEAT: HPI and ROS limited due to pt condition    HPI [name] is a 70 y.o. 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 EMS states pts CBG at home was found to be 23 and they were called by wife for AMS. Per EMS, they administered 1 mg glucagon IM and saw improvement of his CBG to 45. EMS states the pt then became alert enough to speak, eat and drink pepsi and crackers for a short period of time prior to his CBG dropping to 32 again en route. Last seen baseline unknown. Per wife, pt was recently taken off insulin by his doctor because his A1C was improving.   Per wife, the pt was was seen by his PCP yesterday for abdominal pain, diarrhea and vomiting and was dx with a possible GI virus. Wife states the pt has also not been eating well for the last few days with these symptoms. She reports last night he appeared diaphoretic, was lying on top of the covers instead on under like he normally would be and was speaking nonsensically. Wife states she found his CBG to be low during this time and was told to give the pt sugar under his tongue, which he was alert enough to do at this point; however, his sugar only increased to 42 so she called EMS.   Pt was seen in the ER 3 days ago for SOB and was d/c with albuterol tx; however, wife reports pt has persistently felt fatigued and unwell despite these treatments.   The history is provided by the EMS personnel and the spouse. The history is limited by the condition of the  patient. No language interpreter was used.    Past Medical History:  Diagnosis Date  . Adenomatous colon polyp 2006  . AICD (automatic cardioverter/defibrillator) present    STJ device, implanted 12/03/14 Dr. Lovena Le  . Arthritis    Gout  . Benign prostatic hypertrophy   . CHF (congestive heart failure) ([place])   . Chronic combined systolic and diastolic CHF (congestive heart failure) ([place])    a. 02/2014 Echo: EF 20-25% (new), Gr 2 DD, mod conc LVH, mild AI/AS, mod MR, sev dil LA, mild TR.  Marland Kitchen Chronic kidney disease (CKD), stage IV (severe) ([place]) 12/2009   a. baseline creat now ~ 4.  . Colonic polyp   . Constipation   . 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 Erectile dysfunction   . Gout   . H. pylori infection 2013   treated with prevpac  . Hyperlipidemia   . Hyperparathyroidism ([place])   . Hypertension   . Low serum testosterone level   . Mild Ao Stenosis and Insufficiency   . Nonischemic cardiomyopathy ([place])    a. 02/2014 Echo: EF 20-25%;  b. 03/2014 Cath: LM nl, LAD min irregs, LCX   . Obesity, Class I, BMI 30-34.9   . Shortness of breath dyspnea   . Sleep apnea 2010   does  not use CPAP  . Tobacco abuse     Patient Active Problem List   Diagnosis Date Noted  . Sepsis ([place]) 12/26/2015  . Gout 12/08/2015  . Fluid overload 03/29/2015  . CKD (chronic kidney disease) stage 5, GFR less than 15 ml/min (HCC) 03/29/2015  . ARF (acute renal failure) ([place]) 03/29/2015  . Acute on chronic combined systolic and diastolic CHF (congestive heart failure) ([place]) 03/29/2015  . Acute systolic CHF (congestive heart failure) ([place]) 03/08/2015  . CHF exacerbation ([place]) 01/29/2015  . ICD (implantable cardioverter-defibrillator), single, in situ; St Jude 01/29/2015  . Common bile duct stone   . Chronic cholecystitis with calculus 01/08/2015  . Type II diabetes mellitus with nephropathy ([place]) 01/07/2015  . Acute biliary pancreatitis   . Gallstone pancreatitis 01/03/2015    . Pancreatitis 01/03/2015  . Ventricular tachycardia ([place]) 11/17/2014  . Weakness 05/20/2014  . Nonischemic cardiomyopathy ([place]) 03/20/2014  . Cardiomyopathy ([place])   . Chronic systolic heart failure ([place]) 03/18/2014  . Palpitations 02/26/2014  . CHF (congestive heart failure) ([place]) 12/01/2013  . Acute on chronic diastolic CHF (congestive heart failure), NYHA class 2 ([place]) 12/01/2013  . HTN (hypertension) 12/01/2013  . Chest pain 11/02/2013  . Hypokalemia 11/02/2013  . Moderate mitral regurgitation 08/12/2011  . Cholelithiasis 08/10/2011  . Moderate dehydration 07/25/2011  . Chronic systolic congestive heart failure ([place]) 07/21/2011  . Dyspnea 07/19/2011  . COPD (chronic obstructive pulmonary disease) ([place]) 07/18/2011  . Pulmonary nodule 07/14/2011  . Poor appetite 07/12/2011  . Change in bowel movement 07/12/2011  . Syncope 05/12/2011  . Sinusitis 05/12/2011  . Ankle pain, left 02/03/2011  . ED (erectile dysfunction) 11/02/2010  . Aortic valve disease 04/16/2010  . Chronic kidney disease, stage 4, severely decreased GFR ([place]) 04/16/2010  . SLEEP APNEA 01/14/2010  . DEPRESSION 06/29/2009  . BENIGN PROSTATIC HYPERTROPHY, HX OF 03/02/2009  . DEPENDENT EDEMA, LEGS 02/18/2008  . Diabetes mellitus type 2 with retinopathy ([place]) 02/23/2007  . TOBACCO ABUSE 02/23/2007  . Essential hypertension 02/23/2007    Past Surgical History:  Procedure Laterality Date  . AV FISTULA PLACEMENT Right 04/02/2015   Procedure: RIGHT ARM RADIOCEPHALIC ARTERIOVENOUS (AV) FISTULA CREATION;  Surgeon: Conrad Guffey, MD;  Location: [facility];  Service: Vascular;  Laterality: Right;  . CARDIAC CATHETERIZATION    . CHOLECYSTECTOMY N/A 01/07/2015   Procedure: LAPAROSCOPIC CHOLECYSTECTOMY WITH INTRAOPERATIVE CHOLANGIOGRAM;  Surgeon: Donnie Mesa, MD;  Location: [facility];  Service: General;  Laterality: N/A;  . COLONOSCOPY  08/23/2004   Dr. Vivi Ferns rectum, diminutive polyp of the rectosigmoid removed, inflamed  focally adenomatous polyp  . COLONOSCOPY  2013   Dr. Gala Romney: multiple diminutive polyps in distal sigmoid segment, 4 mm pedunculated polyp at hepatic flexure. Tubular adenomas. Surveillance due 2018   . EP IMPLANTABLE DEVICE N/A 12/03/2014   Procedure: ICD Implant;  Surgeon: Evans Lance, MD;  Location: [facility];  Service: Cardiovascular;  Laterality: N/A;; St Jude  . ERCP N/A 01/09/2015   Procedure: ENDOSCOPIC RETROGRADE CHOLANGIOPANCREATOGRAPHY (ERCP);  Surgeon: Clarene Essex, MD;  Location[facility] ENDOSCOPY;  Service: Endoscopy;  Laterality: N/A;  . ESOPHAGOGASTRODUODENOSCOPY  2013   Dr. Gala Romney: erosions reminiscent of GAVE but biopsies showed H.pylori gastritis  . INSERTION OF DIALYSIS CATHETER Right 04/02/2015   Procedure: INSERTION OF DIALYSIS CATHETER - RIGHT INTERNAL JUGULAR;  Surgeon: Conrad Cattaraugus, MD;  Location: [facility];  Service: Vascular;  Laterality: Right;  . LEFT AND RIGHT HEART CATHETERIZATION WITH CORONARY ANGIOGRAM N/A 03/19/2014   Procedure: LEFT  AND RIGHT HEART CATHETERIZATION WITH CORONARY ANGIOGRAM;  Surgeon: Blane Ohara, MD;  Location: [facility] CATH LAB;  Service: Cardiovascular;  Laterality: N/A;  . LYMPH NODE BIOPSY     surgical exploration of neck-not entirely clear that this represented a lymph node biopsy  . RETINAL LASER PROCEDURE     diabetic retinopathy  . UMBILICAL HERNIA REPAIR N/A 01/07/2015   Procedure: HERNIA REPAIR UMBILICAL ADULT;  Surgeon: Donnie Mesa, MD;  Location: [facility];  Service: General;  Laterality: N/A;    OB History    No data available       Home Medications    Prior to Admission medications   Medication Sig Start Date End Date Taking? Authorizing Provider  albuterol (PROVENTIL) (2.5 MG/3ML) 0.083% nebulizer solution as directed. 12/07/15  Yes Historical Provider, MD  allopurinol (ZYLOPRIM) 100 MG tablet Take 1 tablet (100 mg total) by mouth daily. 11/11/15  Yes Susy Frizzle, MD  aspirin EC 81 MG tablet Take 81 mg by mouth daily.    Yes Historical Provider, MD  calcium acetate (PHOSLO) 667 MG capsule Take 1 capsule (667 mg total) by mouth daily. Take with the biggest meal. 06/05/15  Yes Susy Frizzle, MD  Cholecalciferol (VITAMIN D PO) Take 1 capsule by mouth daily.   Yes Historical Provider, MD  lidocaine-prilocaine (EMLA) cream as directed.  06/19/15  Yes Historical Provider, MD  metoprolol succinate (TOPROL-XL) 50 MG 24 hr tablet Take 1 tablet by mouth daily. 10/29/15  Yes Historical Provider, MD  ONE TOUCH ULTRA TEST test strip  06/02/15  Yes Historical Provider, MD  promethazine (PHENERGAN) 12.5 MG tablet Take 1 tablet (12.5 mg total) by mouth every 6 (six) hours as needed for nausea or vomiting. 12/29/15  Yes Alycia Rossetti, MD  tiZANidine (ZANAFLEX) 4 MG capsule Take 1 capsule (4 mg total) by mouth 3 (three) times daily as needed for muscle spasms. 12/25/15  Yes Susy Frizzle, MD  amoxicillin (AMOXIL) 875 MG tablet  12/25/15   Historical Provider, MD    Family History Family History  Problem Relation Age of Onset  . Hypertension Mother   . Cancer Mother   . Cancer Father   . Diabetes Sister     Social History Social History  Substance Use Topics  . Smoking status: Current Some Day Smoker    Packs/day: 0.25    Years: 56.00    Types: Cigarettes    Start date: 05/31/1958  . Smokeless tobacco: Never Used  . Alcohol use No     Allergies   Ace inhibitors   Review of Systems Review of Systems  Unable to perform ROS: Acuity of condition     Physical Exam Updated Vital Signs BP (!) 127/48   Pulse 67   Temp (!) 95.9 F (35.5 C)   Resp 23   Ht 5\' 9"  (1.753 m)   Wt 182 lb (82.6 kg)   SpO2 (!) 87%   BMI 26.88 kg/m   Physical Exam  HENT:  Head: Normocephalic and atraumatic.  Eyes: Conjunctivae are normal.  Neck: Neck supple.  Cardiovascular: Normal rate and regular rhythm.   Pulmonary/Chest: Effort normal and breath sounds normal.  Abdominal: Soft. Bowel sounds are normal.    Musculoskeletal: Normal range of motion.  Neurological: He is alert.  Skin: Skin is warm and dry.  Nursing note and vitals reviewed.    ED Treatments / Results   DIAGNOSTIC STUDIES: Oxygen Saturation is 100% on NRB.    COORDINATION OF CARE:  9:47 AM Will order IV glucose, dextrose, lab work.    Labs (all labs ordered are listed, but only abnormal results are displayed) Labs Reviewed  COMPREHENSIVE METABOLIC PANEL - Abnormal; Notable for the following:       Result Value   Potassium 6.1 (*)    Chloride 85 (*)    CO2 16 (*)    Glucose, Bld 329 (*)    BUN 57 (*)    Creatinine, Ser 9.15 (*)    Total Protein 6.2 (*)    Albumin 2.7 (*)    AST 1,621 (*)    ALT 1,023 (*)    Total Bilirubin 1.5 (*)    GFR calc non Af Amer 5 (*)    GFR calc Af Amer 6 (*)    All other components within normal limits  CBC WITH DIFFERENTIAL/PLATELET - Abnormal; Notable for the following:    WBC 22.5 (*)    RBC 3.18 (*)    Hemoglobin 10.1 (*)    HCT 31.1 (*)    RDW 17.7 (*)    Neutro Abs 20.6 (*)    Lymphs Abs 0.4 (*)    Monocytes Absolute 1.5 (*)    All other components within normal limits  LACTIC ACID, PLASMA - Abnormal; Notable for the following:    Lactic Acid, Venous 15.9 (*)    All other components within normal limits  I-STAT CHEM 8, ED - Abnormal; Notable for the following:    Sodium 133 (*)    Potassium 6.0 (*)    Chloride 95 (*)    BUN 59 (*)    Creatinine, Ser 9.40 (*)    Glucose, Bld 37 (*)    Calcium, Ion 0.91 (*)    Hemoglobin 12.2 (*)    HCT 36.0 (*)    All other components within normal limits  I-STAT CG4 LACTIC ACID, ED - Abnormal; Notable for the following:    Lactic Acid, Venous >17.00 (*)    All other components within normal limits  CBG MONITORING, ED - Abnormal; Notable for the following:    Glucose-Capillary 296 (*)    All other components within normal limits  CBG MONITORING, ED - Abnormal; Notable for the following:    Glucose-Capillary 269 (*)    All  other components within normal limits  CBG MONITORING, ED - Abnormal; Notable for the following:    Glucose-Capillary 288 (*)    All other components within normal limits  CBG MONITORING, ED - Abnormal; Notable for the following:    Glucose-Capillary 354 (*)    All other components within normal limits  CBG MONITORING, ED - Abnormal; Notable for the following:    Glucose-Capillary 36 (*)    All other components within normal limits  CULTURE, BLOOD (ROUTINE X 2)  CULTURE, BLOOD (ROUTINE X 2)  URINE CULTURE  URINALYSIS, ROUTINE W REFLEX MICROSCOPIC  LACTIC ACID, PLASMA  LACTIC ACID, PLASMA  LACTIC ACID, PLASMA    EKG  EKG Interpretation None       Radiology Ct Abdomen Pelvis Wo Contrast  Result Date: 12/25/2015 CLINICAL DATA:  They were called out for unconscious. Upon arrival they took patients blood sugar and it was 23; sepsis; elevated liver enzymes; ams EXAM: CT ABDOMEN AND PELVIS WITHOUT CONTRAST TECHNIQUE: Multidetector CT imaging of the abdomen and pelvis was performed following the standard protocol without IV contrast. COMPARISON:  CT 01/03/2015 FINDINGS: Lower chest: Lung bases are clear. Hepatobiliary: 3.6 cm cyst in the LEFT lateral hepatic lobe. Pneumobilia in  common bile duct and LEFT hepatic lobe. Interval cholecystectomy. No biliary dilatation. Pancreas: Pancreas is normal. No ductal dilatation. No pancreatic inflammation. Spleen: Normal spleen Adrenals/urinary tract: Adrenal glands are mildly thickened. There are several simple fluid attenuation lesions associated with the kidneys. No ureterolithiasis or obstructive uropathy. Foley catheter within collapsed bladder. Stomach/Bowel: Stomach is distended by fluid. No obstructing lesions identified. The small bowel and cecum are normal. The appendix is normal. The colon and rectosigmoid colon are normal. Vascular/Lymphatic: Abdominal aorta is normal caliber with atherosclerotic calcification. There is no retroperitoneal or  periportal lymphadenopathy. No pelvic lymphadenopathy. Reproductive: Prostate normal Other: No free fluid. Musculoskeletal: Round well-circumscribed lesion in the LEFT femoral neck has a thin sclerotic rim appears benign. No change from comparison examNo aggressive osseous lesion. IMPRESSION: 1. Large volume of fluid within the stomach suggests gastroparesis 2. Pneumobilia is new from comparison exam. Interval cholecystectomy and presumably a sphincterotomy. 3.  Atherosclerotic calcification of the aorta. 4. Foley catheter in bladder. 5. Stable benign-appearing sclerotic lesion in the LEFT femur. Electronically Signed   By: Suzy Bouchard M.D.   On: 01/05/2016 12:10   Ct Head Wo Contrast  Result Date: 12/26/2015 CLINICAL DATA:  Altered mental status.  Hypoglycemia. EXAM: CT HEAD WITHOUT CONTRAST TECHNIQUE: Contiguous axial images were obtained from the base of the skull through the vertex without intravenous contrast. COMPARISON:  None. FINDINGS: Brain: There is mild to moderate diffuse atrophy. There is no intracranial mass, hemorrhage, extra-axial fluid collection, or midline shift. There is patchy small vessel disease in the centra semiovale bilaterally, primarily posteriorly and superiorly. Elsewhere gray-white compartments appear unremarkable. No acute infarct is evident. There is calcification in each basal ganglia region, a physiologic finding in this age group. Vascular: There is no hyperdense vessel. There are foci of calcification in each carotid siphon region. Skull: The bony calvarium appears intact. Sinuses/Orbits: There is a retention cyst in the left maxillary antrum. There is mild mucosal thickening in several ethmoid air cells bilaterally. Other visualized paranasal sinuses are clear. Orbits appear symmetric bilaterally. Other: There is opacification of several inferior mastoid air cells on the left. Visualized mastoid air cells elsewhere appear clear. There is mild debris in each external  auditory canal. IMPRESSION: Atrophy with patchy periventricular small vessel disease. No intracranial mass, hemorrhage, or extra-axial fluid collection. No acute infarct evident. Basal ganglia calcification bilaterally is physiologic in this age group. Foci of arterial vascular calcification noted. Mild left-sided mastoid air cell disease. Probable cerumen in each external auditory canal. Foci of paranasal sinus disease at several sites. Electronically Signed   By: Lowella Grip III M.D.   On: 12/20/2015 11:00   Dg Chest Port 1 View  Result Date: 01/12/2016 CLINICAL DATA:  Sepsis EXAM: PORTABLE CHEST 1 VIEW COMPARISON:  12/27/2015 FINDINGS: Cardiac shadow is enlarged. A defibrillator is again noted and stable. The lungs are well aerated bilaterally without focal infiltrate or sizable effusion. No acute bony abnormality is seen. IMPRESSION: No active disease. Electronically Signed   By: Inez Catalina M.D.   On: 01/11/2016 10:42    Procedures Procedures (including critical care time)  Medications Ordered in ED Medications  sodium chloride 0.9 % bolus 1,000 mL (0 mLs Intravenous Stopped 12/20/2015 1231)    And  sodium chloride 0.9 % bolus 1,000 mL (1,000 mLs Intravenous New Bag/Given 01/01/2016 1019)    And  sodium chloride 0.9 % bolus 500 mL (500 mLs Intravenous New Bag/Given 12/21/2015 1231)  piperacillin-tazobactam (ZOSYN) IVPB 3.375 g (not administered)  epoetin  alfa (EPOGEN,PROCRIT) injection 4,000 Units (not administered)  dextrose 5 % and 0.45% NaCl 5-0.45 % bolus 500 mL (not administered)  dextrose 50 % solution 50 mL (50 mLs Intravenous Given 01/07/2016 0939)  dextrose 50 % solution 50 mL (50 mLs Intravenous Given 01/08/2016 0946)  piperacillin-tazobactam (ZOSYN) IVPB 3.375 g (0 g Intravenous Stopped 12/18/2015 1155)  vancomycin (VANCOCIN) IVPB 1000 mg/200 mL premix (0 mg Intravenous Stopped 12/22/2015 1231)     Initial Impression / Assessment and Plan / ED Course  I have reviewed the triage  vital signs and the nursing notes.  Pertinent labs & imaging results that were available during my care of the patient were reviewed by me and considered in my medical decision making (see chart for details).    CRITICAL CARE Performed by: Nat Christen Total critical care time: 70 minutes Critical care time was exclusive of separately billable procedures and treating other patients. Critical care was necessary to treat or prevent imminent or life-threatening deterioration. Critical care was time spent personally by me on the following activities: development of treatment plan with patient and/or surrogate as well as nursing, discussions with consultants, evaluation of patient's response to treatment, examination of patient, obtaining history from patient or surrogate, ordering and performing treatments and interventions, ordering and review of laboratory studies, ordering and review of radiographic studies, pulse oximetry and re-evaluation of patient's condition. Clinical Course    1100 patient rechecked. Glucose has improved mental status has not. Vital signs are stable.  1130 patient rechecked. Vital signs are stable. Mental status still impaired.    Complex patient with end-stage renal disease and sepsis presents with altered mental status and hypoglycemia. Blood pressure and pulse have remained stable. Initial lactate 17. Sepsis protocol initiated. IV antibiotics started. Fluid bolus given. Discussed with his nephrologist, gastroenterologist, admitting physician. Also discussed with wife and daughter his critical medical condition.    Final Clinical Impressions(s) / ED Diagnoses   Final diagnoses:  Sepsis, due to unspecified organism ([place])  ESRD (end stage renal disease) ([place])  Elevated liver function tests  Hypoglycemia    New Prescriptions New Prescriptions   No medications on file    I personally performed the services described in this documentation, which was scribed in my  presence. The recorded information has been reviewed and is accurate.     Nat Christen, MD 01/07/2016 1249

## 2015-12-30 NOTE — H&P (Signed)
TRH H&P   Patient Demographics:    [name], is a 70 y.o. male  MRN: [id number]   DOB - 1946-01-18  Admit Date - 12/18/2015  Outpatient Primary MD for the patient is [facility] TOM, MD  Patient coming from: Home  Chief Complaint  Patient presents with  . Hypoglycemia      HPI:    [name]  is a 70 y.o. male, with H/O Nonischemic cardiomyopathy with Chronic combined systolic and diastolic CHF  EF 123456 in 0000000 Has AICD, ESRD MWF schedule, Dyslipidemia, hypertension, gout, BPH, ongoing smoking abuse, sleep apnea and does not wear CPAP, who has had cholecystectomy comes to the hospital with 2 day history of diarrhea, generalized abdominal ache, continued to feel poorly and came to the ER where his initial workup suggested dehydration, sepsis/shock with elevated lactic acid levels of 15, hypoglycemia and elevated liver enzymes. CT scan of the abdomen was nonspecific with some pneumobilia consistent with previous sphincterotomy. GI was consulted and I was requested to admit.  The patient currently is feeling weak however he denies any fever chills, no headache, no chest or abdominal pain, no focal weakness, last bowel movement was early morning 6 AM, no blood in stool or urine no dysuria. No exposure to sick contacts.    Review of systems:    In addition to the HPI above,  Currently negative review of systems No Fever-chills, No Headache, No changes with Vision or hearing, No problems swallowing food or Liquids, No Chest pain, Cough or Shortness of Breath, No Abdominal pain, No Nausea or Vommitting, Bowel movements are regular, No Blood in stool or Urine, No dysuria, No new skin rashes or bruises, No new  joints pains-aches,  No new weakness, tingling, numbness in any extremity, No recent weight gain or loss, No polyuria, polydypsia or polyphagia, No significant Mental Stressors.  A full 10 point Review of Systems was done, except as stated above, all other Review of Systems were negative.   With Past History of the following :    Past Medical History:  Diagnosis Date  . Adenomatous colon polyp 2006  . AICD (automatic cardioverter/defibrillator) present    STJ device, implanted 12/03/14 Dr. Lovena Le  . Arthritis    Gout  . Benign prostatic hypertrophy   . CHF (congestive heart failure) ([place])   .  Chronic combined systolic and diastolic CHF (congestive heart failure) ([place])    a. 02/2014 Echo: EF 20-25% (new), Gr 2 DD, mod conc LVH, mild AI/AS, mod MR, sev dil LA, mild TR.  Marland Kitchen Chronic kidney disease (CKD), stage IV (severe) ([place]) 12/2009   a. baseline creat now ~ 4.  . Colonic polyp   . Constipation   . 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 Erectile dysfunction   . Gout   . H. pylori infection 2013   treated with prevpac  . Hyperlipidemia   . Hyperparathyroidism ([place])   . Hypertension   . Low serum testosterone level   . Mild Ao Stenosis and Insufficiency   . Nonischemic cardiomyopathy ([place])    a. 02/2014 Echo: EF 20-25%;  b. 03/2014 Cath: LM nl, LAD min irregs, LCX   . Obesity, Class I, BMI 30-34.9   . Shortness of breath dyspnea   . Sleep apnea 2010   does not use CPAP  . Tobacco abuse       Past Surgical History:  Procedure Laterality Date  . AV FISTULA PLACEMENT Right 04/02/2015   Procedure: RIGHT ARM RADIOCEPHALIC ARTERIOVENOUS (AV) FISTULA CREATION;  Surgeon: Conrad [place], MD;  Location: [place];  Service: Vascular;  Laterality: Right;  . CARDIAC CATHETERIZATION    . CHOLECYSTECTOMY N/A 01/07/2015   Procedure: LAPAROSCOPIC CHOLECYSTECTOMY WITH INTRAOPERATIVE CHOLANGIOGRAM;  Surgeon: Donnie Mesa, MD;  Location: [place];  Service: General;   Laterality: N/A;  . COLONOSCOPY  08/23/2004   Dr. Vivi Ferns rectum, diminutive polyp of the rectosigmoid removed, inflamed focally adenomatous polyp  . COLONOSCOPY  2013   Dr. Gala Romney: multiple diminutive polyps in distal sigmoid segment, 4 mm pedunculated polyp at hepatic flexure. Tubular adenomas. Surveillance due 2018   . EP IMPLANTABLE DEVICE N/A 12/03/2014   Procedure: ICD Implant;  Surgeon: Evans Lance, MD;  Location: [place] [facility];  Service: Cardiovascular;  Laterality: N/A;; St Jude  . ERCP N/A 01/09/2015   Procedure: ENDOSCOPIC RETROGRADE CHOLANGIOPANCREATOGRAPHY (ERCP);  Surgeon: Clarene Essex, MD;  Location: [facility] ENDOSCOPY;  Service: Endoscopy;  Laterality: N/A;  . ESOPHAGOGASTRODUODENOSCOPY  2013   Dr. Gala Romney: erosions reminiscent of GAVE but biopsies showed H.pylori gastritis  . INSERTION OF DIALYSIS CATHETER Right 04/02/2015   Procedure: INSERTION OF DIALYSIS CATHETER - RIGHT INTERNAL JUGULAR;  Surgeon: Conrad Disautel, MD;  Location: [place];  Service: Vascular;  Laterality: Right;  . LEFT AND RIGHT HEART CATHETERIZATION WITH CORONARY ANGIOGRAM N/A 03/19/2014   Procedure: LEFT AND RIGHT HEART CATHETERIZATION WITH CORONARY ANGIOGRAM;  Surgeon: Blane Ohara, MD;  Location: [facility] CATH LAB;  Service: Cardiovascular;  Laterality: N/A;  . LYMPH NODE BIOPSY     surgical exploration of neck-not entirely clear that this represented a lymph node biopsy  . RETINAL LASER PROCEDURE     diabetic retinopathy  . UMBILICAL HERNIA REPAIR N/A 01/07/2015   Procedure: HERNIA REPAIR UMBILICAL ADULT;  Surgeon: Donnie Mesa, MD;  Location: [place] OR;  Service: General;  Laterality: N/A;      Social History:     Social History  Substance Use Topics  . Smoking status: Current Some Day Smoker    Packs/day: 0.25    Years: 56.00    Types: Cigarettes    Start date: 05/31/1958  . Smokeless tobacco: Never Used  . Alcohol use No         Family History :     Family History  Problem Relation Age  of  Onset  . Hypertension Mother   . Cancer Mother   . Cancer Father   . Diabetes Sister        Home Medications:   Prior to Admission medications   Medication Sig Start Date End Date Taking? Authorizing Provider  albuterol (PROVENTIL) (2.5 MG/3ML) 0.083% nebulizer solution as directed. 12/07/15  Yes Historical Provider, MD  allopurinol (ZYLOPRIM) 100 MG tablet Take 1 tablet (100 mg total) by mouth daily. 11/11/15  Yes Susy Frizzle, MD  aspirin EC 81 MG tablet Take 81 mg by mouth daily.   Yes Historical Provider, MD  calcium acetate (PHOSLO) 667 MG capsule Take 1 capsule (667 mg total) by mouth daily. Take with the biggest meal. 06/05/15  Yes Susy Frizzle, MD  Cholecalciferol (VITAMIN D PO) Take 1 capsule by mouth daily.   Yes Historical Provider, MD  lidocaine-prilocaine (EMLA) cream as directed.  06/19/15  Yes Historical Provider, MD  metoprolol succinate (TOPROL-XL) 50 MG 24 hr tablet Take 1 tablet by mouth daily. 10/29/15  Yes Historical Provider, MD  ONE TOUCH ULTRA TEST test strip  06/02/15  Yes Historical Provider, MD  promethazine (PHENERGAN) 12.5 MG tablet Take 1 tablet (12.5 mg total) by mouth every 6 (six) hours as needed for nausea or vomiting. 12/29/15  Yes Alycia Rossetti, MD  tiZANidine (ZANAFLEX) 4 MG capsule Take 1 capsule (4 mg total) by mouth 3 (three) times daily as needed for muscle spasms. 12/25/15  Yes Susy Frizzle, MD  amoxicillin (AMOXIL) 875 MG tablet  12/25/15   Historical Provider, MD     Allergies:     Allergies  Allergen Reactions  . Ace Inhibitors Cough     Physical Exam:   Vitals  Blood pressure (!) 126/54, pulse 70, temperature 97 F (36.1 C), resp. rate 26, height 5\' 9"  (1.753 m), weight 82.6 kg (182 lb), SpO2 100 %.   1. General Elderly African-American male who appears slightly sleepy lying in bed in NAD,    2. Normal affect and insight, Not Suicidal or Homicidal, Awake Alert, Oriented X 3.  3. No F.N deficits, ALL C.Nerves Intact,  Strength 5/5 all 4 extremities, Sensation intact all 4 extremities, Plantars down going.  4. Ears and Eyes appear Normal, Conjunctivae clear, PERRLA. Moist Oral Mucosa.  5. Supple Neck, No JVD, No cervical lymphadenopathy appriciated, No Carotid Bruits.  6. Symmetrical Chest wall movement, Good air movement bilaterally, CTAB.  7. RRR, No Gallops, Rubs or Murmurs, No Parasternal Heave.  8. Positive Bowel Sounds, Abdomen Soft, No tenderness, No organomegaly appriciated,No rebound -guarding or rigidity.  9.  No Cyanosis, Normal Skin Turgor, No Skin Rash or Bruise.  10. Good muscle tone,  joints appear normal , no effusions, Normal ROM.  11. No Palpable Lymph Nodes in Neck or Axillae      Data Review:    CBC  Recent Labs Lab 12/27/15 1012 01/15/2016 0951 12/20/2015 1013  WBC 12.1*  --  22.5*  HGB 10.3* 12.2* 10.1*  HCT 31.2* 36.0* 31.1*  PLT 199  --  223  MCV 94.0  --  97.8  MCH 31.0  --  31.8  MCHC 33.0  --  32.5  RDW 17.4*  --  17.7*  LYMPHSABS 1.0  --  0.4*  MONOABS 0.8  --  1.5*  EOSABS 0.2  --  0.0  BASOSABS 0.0  --  0.0   ------------------------------------------------------------------------------------------------------------------  Chemistries   Recent Labs Lab 12/27/15 1012 12/29/2015 0951 01/14/2016 1013  NA 132* 133* 135  K 4.1 6.0* 6.1*  CL 89* 95* 85*  CO2 27  --  16*  GLUCOSE 197* 37* 329*  BUN 36* 59* 57*  CREATININE 8.06* 9.40* 9.15*  CALCIUM 9.1  --  9.3  AST  --   --  1,621*  ALT  --   --  1,023*  ALKPHOS  --   --  120  BILITOT  --   --  1.5*   ------------------------------------------------------------------------------------------------------------------ estimated creatinine clearance is 6.4 mL/min for male patients and 7.3 mL/min for male patients (by C-G formula based on SCr of 9.15 mg/dL (H)). ------------------------------------------------------------------------------------------------------------------ No results for input(s):  TSH, T4TOTAL, T3FREE, THYROIDAB in the last 72 hours.  Invalid input(s): FREET3  Coagulation profile No results for input(s): INR, PROTIME in the last 168 hours. ------------------------------------------------------------------------------------------------------------------- No results for input(s): DDIMER in the last 72 hours. -------------------------------------------------------------------------------------------------------------------  Cardiac Enzymes  Recent Labs Lab 12/27/15 1012 12/27/15 1327  TROPONINI 0.19* 0.18*   ------------------------------------------------------------------------------------------------------------------    Component Value Date/Time   BNP >4,500.0 (H) 12/27/2015 1012   BNP 1,583.3 (H) 07/12/2011 1823     ---------------------------------------------------------------------------------------------------------------  Urinalysis    Component Value Date/Time   COLORURINE YELLOW 03/29/2015 1649   APPEARANCEUR CLEAR 03/29/2015 1649   LABSPEC 1.010 03/29/2015 1649   PHURINE 5.5 03/29/2015 1649   GLUCOSEU NEGATIVE 03/29/2015 1649   HGBUR NEGATIVE 03/29/2015 1649   BILIRUBINUR NEGATIVE 03/29/2015 1649   KETONESUR NEGATIVE 03/29/2015 1649   PROTEINUR 100 (A) 03/29/2015 1649   UROBILINOGEN 0.2 05/20/2014 1146   NITRITE NEGATIVE 03/29/2015 1649   LEUKOCYTESUR NEGATIVE 03/29/2015 1649    ----------------------------------------------------------------------------------------------------------------   Imaging Results:    Ct Abdomen Pelvis Wo Contrast  Result Date: 01/04/2016 CLINICAL DATA:  They were called out for unconscious. Upon arrival they took patients blood sugar and it was 23; sepsis; elevated liver enzymes; ams EXAM: CT ABDOMEN AND PELVIS WITHOUT CONTRAST TECHNIQUE: Multidetector CT imaging of the abdomen and pelvis was performed following the standard protocol without IV contrast. COMPARISON:  CT 01/03/2015 FINDINGS: Lower chest:  Lung bases are clear. Hepatobiliary: 3.6 cm cyst in the LEFT lateral hepatic lobe. Pneumobilia in common bile duct and LEFT hepatic lobe. Interval cholecystectomy. No biliary dilatation. Pancreas: Pancreas is normal. No ductal dilatation. No pancreatic inflammation. Spleen: Normal spleen Adrenals/urinary tract: Adrenal glands are mildly thickened. There are several simple fluid attenuation lesions associated with the kidneys. No ureterolithiasis or obstructive uropathy. Foley catheter within collapsed bladder. Stomach/Bowel: Stomach is distended by fluid. No obstructing lesions identified. The small bowel and cecum are normal. The appendix is normal. The colon and rectosigmoid colon are normal. Vascular/Lymphatic: Abdominal aorta is normal caliber with atherosclerotic calcification. There is no retroperitoneal or periportal lymphadenopathy. No pelvic lymphadenopathy. Reproductive: Prostate normal Other: No free fluid. Musculoskeletal: Round well-circumscribed lesion in the LEFT femoral neck has a thin sclerotic rim appears benign. No change from comparison examNo aggressive osseous lesion. IMPRESSION: 1. Large volume of fluid within the stomach suggests gastroparesis 2. Pneumobilia is new from comparison exam. Interval cholecystectomy and presumably a sphincterotomy. 3.  Atherosclerotic calcification of the aorta. 4. Foley catheter in bladder. 5. Stable benign-appearing sclerotic lesion in the LEFT femur. Electronically Signed   By: Suzy Bouchard M.D.   On: 01/03/2016 12:10   Ct Head Wo Contrast  Result Date: 12/25/2015 CLINICAL DATA:  Altered mental status.  Hypoglycemia. EXAM: CT HEAD WITHOUT CONTRAST TECHNIQUE: Contiguous axial images were obtained from the base of the skull through the vertex without intravenous  contrast. COMPARISON:  None. FINDINGS: Brain: There is mild to moderate diffuse atrophy. There is no intracranial mass, hemorrhage, extra-axial fluid collection, or midline shift. There is patchy  small vessel disease in the centra semiovale bilaterally, primarily posteriorly and superiorly. Elsewhere gray-white compartments appear unremarkable. No acute infarct is evident. There is calcification in each basal ganglia region, a physiologic finding in this age group. Vascular: There is no hyperdense vessel. There are foci of calcification in each carotid siphon region. Skull: The bony calvarium appears intact. Sinuses/Orbits: There is a retention cyst in the left maxillary antrum. There is mild mucosal thickening in several ethmoid air cells bilaterally. Other visualized paranasal sinuses are clear. Orbits appear symmetric bilaterally. Other: There is opacification of several inferior mastoid air cells on the left. Visualized mastoid air cells elsewhere appear clear. There is mild debris in each external auditory canal. IMPRESSION: Atrophy with patchy periventricular small vessel disease. No intracranial mass, hemorrhage, or extra-axial fluid collection. No acute infarct evident. Basal ganglia calcification bilaterally is physiologic in this age group. Foci of arterial vascular calcification noted. Mild left-sided mastoid air cell disease. Probable cerumen in each external auditory canal. Foci of paranasal sinus disease at several sites. Electronically Signed   By: Lowella Grip III M.D.   On: 01/10/2016 11:00   Dg Chest Port 1 View  Result Date: 12/28/2015 CLINICAL DATA:  Sepsis EXAM: PORTABLE CHEST 1 VIEW COMPARISON:  12/27/2015 FINDINGS: Cardiac shadow is enlarged. A defibrillator is again noted and stable. The lungs are well aerated bilaterally without focal infiltrate or sizable effusion. No acute bony abnormality is seen. IMPRESSION: No active disease. Electronically Signed   By: Inez Catalina M.D.   On: 01/10/2016 10:42    My personal review of EKG: Rhythm NSR, non specific ST changes   Assessment & Plan:     1. Sepsis - with severe hypotension, source not entirely clear, his liver  enzymes are elevated and this could be the primary insult or this could be a reflection of shock liver from hypotension. Case discussed with GI physician Dr. Laural Golden. For now per GI chances of cholangitis are low, cultures have been drawn in the ER, placed on clears, empiric IV Zosyn. Follow cultures, trend liver enzymes. We'll defer any further GI workup to gastroenterology. I have ordered hepatitis panel, acetaminophen level, question if patient needs hepatic vein imaging. Check INR to monitor liver function.  2. ESRD. On MWF schedule, renal informed.  3. Chronic combined systolic and diastolic CHF EF 123456 with AICD history of nonischemic myopathy. Currently dehydrated. Gentle D5W drip and monitor.  4. Hypoglycemia due to combination of #1 above, diarrhea, poor oral intake. D50 and monitor.  5. Tobacco abuse. Counseled to quit.  6. COPD. Currently stable at baseline. Supportive care only.  7. 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 Will request cardiology to evaluate EKG and comment.  8. Gout. Stable. Resume allopurinol once he is clinically better.  9. ? UTI - UA obtained in the ESRD patient, specimen likely dirty and probably colonized, for now Zosyn to continue. Follow cultures.    DVT Prophylaxis Heparin    AM Labs Ordered, also please review Full Orders  Family Communication: Admission, patients condition and plan of care including tests being ordered have been discussed with the patient and family who indicate understanding and agree with the plan and Code Status.  Code Status DNR  Likely DC to  Home 1-2 days  Condition GUARDED     Consults called: GI    Admission  status: Inpt   Time spent in minutes : 35   Zeki Bedrosian K M.D on 12/21/2015 at 2:16 PM  Between 7am to 7pm - Pager - [phone]. After 7pm go to [email or web address] - password [facility]  [facility] - Office  [phone]

## 2015-12-30 NOTE — ED Notes (Signed)
[employer] on pt.

## 2015-12-30 NOTE — ED Triage Notes (Signed)
Pt comes in by EMS for hypoglycemia. They were called out for unconscious. Upon arrival they took patients blood sugar and it was 23. Pt was given 1mg  of Glucagon with increase to 35. Pt last CBG before arrival was 41. Pt is arousable to touch.

## 2015-12-30 NOTE — Progress Notes (Signed)
Spoke to Dr. Candiss Norse about patient abnormal AST levels, he instructed me to let Dr. Laural Golden know about these levels as well. Dr. Laural Golden stated that he would place orders. Will continue to monitor.

## 2015-12-31 ENCOUNTER — Inpatient Hospital Stay (HOSPITAL_COMMUNITY): Payer: Medicare Other

## 2015-12-31 DIAGNOSIS — A09 Infectious gastroenteritis and colitis, unspecified: Secondary | ICD-10-CM

## 2015-12-31 DIAGNOSIS — R197 Diarrhea, unspecified: Secondary | ICD-10-CM

## 2015-12-31 DIAGNOSIS — R7989 Other specified abnormal findings of blood chemistry: Secondary | ICD-10-CM

## 2015-12-31 LAB — COMPREHENSIVE METABOLIC PANEL
ALT: 2934 U/L — ABNORMAL HIGH (ref 17–63)
ANION GAP: 18 — AB (ref 5–15)
AST: 4590 U/L — AB (ref 15–41)
Albumin: 2.6 g/dL — ABNORMAL LOW (ref 3.5–5.0)
Alkaline Phosphatase: 123 U/L (ref 38–126)
BUN: 49 mg/dL — ABNORMAL HIGH (ref 6–20)
CHLORIDE: 94 mmol/L — AB (ref 101–111)
CO2: 22 mmol/L (ref 22–32)
Calcium: 8.9 mg/dL (ref 8.9–10.3)
Creatinine, Ser: 6.06 mg/dL — ABNORMAL HIGH (ref 0.61–1.24)
GFR, EST AFRICAN AMERICAN: 10 mL/min — AB (ref >60)
GFR, EST NON AFRICAN AMERICAN: 8 mL/min — AB (ref >60)
Glucose, Bld: 269 mg/dL — ABNORMAL HIGH (ref 65–99)
POTASSIUM: 4.6 mmol/L (ref 3.5–5.1)
Sodium: 134 mmol/L — ABNORMAL LOW (ref 135–145)
TOTAL PROTEIN: 5.8 g/dL — AB (ref 6.5–8.1)
Total Bilirubin: 0.8 mg/dL (ref 0.3–1.2)

## 2015-12-31 LAB — CBC
HCT: 29.6 % — ABNORMAL LOW (ref 39.0–52.0)
Hemoglobin: 9.6 g/dL — ABNORMAL LOW (ref 13.0–17.0)
MCH: 30.2 pg (ref 26.0–34.0)
MCHC: 32.4 g/dL (ref 30.0–36.0)
MCV: 93.1 fL (ref 78.0–100.0)
PLATELETS: 156 10*3/uL (ref 150–400)
RBC: 3.18 MIL/uL — ABNORMAL LOW (ref 4.22–5.81)
RDW: 17 % — AB (ref 11.5–15.5)
WBC: 24.9 10*3/uL — AB (ref 4.0–10.5)

## 2015-12-31 LAB — HEPATITIS PANEL, ACUTE
HCV AB: 0.2 {s_co_ratio} (ref 0.0–0.9)
HEP B C IGM: NEGATIVE
Hep A IgM: NEGATIVE
Hepatitis B Surface Ag: NEGATIVE

## 2015-12-31 LAB — GLUCOSE, CAPILLARY
GLUCOSE-CAPILLARY: 342 mg/dL — AB (ref 65–99)
GLUCOSE-CAPILLARY: 262 mg/dL — AB (ref 65–99)
Glucose-Capillary: 236 mg/dL — ABNORMAL HIGH (ref 65–99)
Glucose-Capillary: 200 mg/dL — ABNORMAL HIGH (ref 65–99)
Glucose-Capillary: 202 mg/dL — ABNORMAL HIGH (ref 65–99)

## 2015-12-31 LAB — PROTIME-INR
INR: 2.14
PROTHROMBIN TIME: 24.3 s — AB (ref 11.4–15.2)

## 2015-12-31 LAB — LACTIC ACID, PLASMA: LACTIC ACID, VENOUS: 4.9 mmol/L — AB (ref 0.5–1.9)

## 2015-12-31 MED ORDER — LORAZEPAM 1 MG PO TABS
2.0000 mg | ORAL_TABLET | Freq: Once | ORAL | Status: AC
  Administered 2015-12-31: 2 mg via ORAL
  Filled 2015-12-31: qty 2

## 2015-12-31 MED ORDER — VITAMIN K1 10 MG/ML IJ SOLN
10.0000 mg | Freq: Once | INTRAMUSCULAR | Status: AC
Start: 2015-12-31 — End: 2015-12-31
  Administered 2015-12-31: 10 mg via SUBCUTANEOUS
  Filled 2015-12-31: qty 1

## 2015-12-31 MED ORDER — NEPRO/CARBSTEADY PO LIQD
237.0000 mL | ORAL | Status: DC
  Administered 2016-01-04 – 2016-01-05 (×3): 237 mL via ORAL

## 2015-12-31 NOTE — Progress Notes (Signed)
Pharmacy Antibiotic Note  [name] is a 70 y.o. male admitted on 12/28/2015 with sepsis.  Pharmacy has been consulted for ZOSYN dosing.  Plan: Zosyn 3.375gm IV q12hrs (adjusted for dialysis) Monitor labs, progress, c/s  Height: 5\' 9"  (175.3 cm) Weight: 184 lb 1.4 oz (83.5 kg) IBW/kg (Calculated) : 70.7  Temp (24hrs), Avg:97.2 F (36.2 C), Min:95.5 F (35.3 C), Max:99.1 F (37.3 C)   Recent Labs Lab 12/27/15 1012 12/20/2015 0950 01/02/2016 0951 01/05/2016 1013 01/16/2016 1252 01/05/2016 1704 12/31/15 0453  WBC 12.1*  --   --  22.5*  --  24.3* 24.9*  CREATININE 8.06*  --  9.40* 9.15*  --  8.83* 6.06*  LATICACIDVEN  --  >17.00*  --  15.9* 13.6* 12.0*  --     Estimated Creatinine Clearance (by C-G formula based on SCr of 6.06 mg/dL (H)) Male: 9.7 mL/min Male: 11 mL/min    Allergies  Allergen Reactions  . Ace Inhibitors Cough   Antimicrobials this admission: Zosyn 12/13 >>  Vancomycin 12/13 x 1  Dose adjustments this admission: 12/13  Microbiology results:  BCx: ngtd  UCx:    Sputum:    MRSA PCR: negative  Thank you for allowing pharmacy to be a part of this patient's care.  Hart Robinsons A 12/31/2015 11:04 AM

## 2015-12-31 NOTE — Progress Notes (Signed)
Initial Nutrition Assessment  DOCUMENTATION CODES:  Not applicable  INTERVENTION:  D/C resource breeze  Nepro Shake po q 24 hrs, each supplement provides 425 kcal and 19 grams protein  NUTRITION DIAGNOSIS:  Increased nutrient needs related to acute illness (sepsis), chronic illness (ESRD on HD) as evidenced by estimated nutritional requirements for this condition  GOAL:  Patient will meet greater than or equal to 90% of their needs  MONITOR:  PO intake, Supplement acceptance, Diet advancement, Labs, Weight trends  REASON FOR ASSESSMENT:  Malnutrition Screening Tool    ASSESSMENT:  70 y.o. male CHF, has  AICD, DM2,  ESRD MWF HLD, HTN, Gout, tobacco abuse.  Presents with 2 days of diarrhea and abdominal ache. Worked up for dehydration, sepsis/shock with elevated lactic acid levels of 15, hypoglycemia and elevated liver enzymes. Admitted for management  Wife was at bedside. She states that patient symptoms began Monday. Prior to that time, he was in his normal state of health. Had not had any progressive decline leading up to illness.   He has been on HD since Spring of this year. Wife denies the patient having any problems with dialysis thus far.   At baseline, they somewhat follow a renal diet, meaning that they try to eat everything "in moderation". They do not specifically avoid many foods. They typically eat 2 meals daily. He did not regularly receive Nepro or protein bars at HD. He took vit d at home.   UBW/Dry weight reported as 86 kg (189 lbs). He was dehydrated on admission and appears to have weighed 182 lbs.His weight appears relatively stable for the last 6 months  [name] diet has just been advanced to a renal diet. He was agreeable to trying Nepro. Patient does not have any appetite at this time.   NFPE: WDL, Abdomen soft non distended.   RN reports Diarrhea has resolved.   Labs: Severe Hyperglycemia, WBC: 24.9, extremely elevated Liver enzymes, Albumin: 2.6. No  recent phos.   Medications: Phoslo, [employer], Zosyn   Recent Labs Lab 01/07/2016 1013 01/08/2016 1704 12/31/15 0453  NA 135 130* 134*  K 6.1* 6.0* 4.6  CL 85* 90* 94*  CO2 16* 14* 22  BUN 57* 65* 49*  CREATININE 9.15* 8.83* 6.06*  CALCIUM 9.3 8.3* 8.9  GLUCOSE 329* 568* 269*   Diet Order:  Diet full liquid Room service appropriate? Yes; Fluid consistency: Thin  Skin:  Reviewed, no issues  Last BM:  12/13  Height:  Ht Readings from Last 1 Encounters:  12/29/2015 5\' 9"  (1.753 m)   Weight:  Wt Readings from Last 1 Encounters:  12/31/15 184 lb 1.4 oz (83.5 kg)   Wt Readings from Last 10 Encounters:  12/31/15 184 lb 1.4 oz (83.5 kg)  12/29/15 182 lb (82.6 kg)  12/27/15 186 lb (84.4 kg)  12/25/15 186 lb (84.4 kg)  12/08/15 190 lb (86.2 kg)  12/08/15 190 lb 6.4 oz (86.4 kg)  11/06/15 192 lb (87.1 kg)  10/02/15 186 lb (84.4 kg)  08/13/15 184 lb (83.5 kg)  07/02/15 180 lb (81.6 kg)   Ideal Body Weight:  72.73 kg  BMI:  Body mass index is 27.18 kg/m.  Estimated Nutritional Needs:  Kcal:  2200-2400 (32-34 kcal/kg bw) Protein:  110-125 g (1.5-1.7 g/kg bw) Fluid:  Per MD  EDUCATION NEEDS:  No education needs identified at this time  Burtis Junes RD, LDN, [facility] Pager: [phone] 12/31/2015 4:09 PM

## 2015-12-31 NOTE — Progress Notes (Signed)
PROGRESS NOTE                                                                                                                                                                                                             Patient Demographics:    [name], is a 70 y.o. male, DOB - 03-21-1945, [id number]  Admit date - 01/12/2016   Admitting Physician Thurnell Lose, MD  Outpatient Primary MD for the patient is Odette Fraction, MD  LOS - 1  Chief Complaint  Patient presents with  . Hypoglycemia       Brief Narrative    [name]  is a 70 y.o. male, with H/O Nonischemic cardiomyopathy with Chronic combined systolic and diastolic CHF  EF [id number] in 0000000 Has AICD, ESRD MWF schedule, Dyslipidemia, hypertension, gout, BPH, ongoing smoking abuse, sleep apnea and does not wear CPAP, who has had cholecystectomy comes to the hospital with 2 day history of diarrhea, generalized abdominal ache, continued to feel poorly and came to the ER where his initial workup suggested dehydration, sepsis/shock with elevated lactic acid levels of 15, hypoglycemia and elevated liver enzymes. CT scan of the abdomen was nonspecific with some pneumobilia consistent with previous sphincterotomy. GI was consulted and I was requested to admit.  The patient currently is feeling weak however he denies any fever chills, no headache, no chest or abdominal pain, no focal weakness, last bowel movement was early morning 6 AM, no blood in stool or urine no dysuria. No exposure to sick contacts.      Subjective:    [name] today has, No headache, No chest pain, mild abdominal pain - No Nausea, No new weakness tingling or numbness, No Cough - SOB.     Assessment  & Plan :     1. Sepsis - with severe hypotension, source not entirely clear, his liver enzymes are elevated and this could be the primary insult or this could be a reflection of shock liver  from hypotension. Ac.Hepatitis panel -ve, Vascular US pending, GI Following, Continue antibiotics covering for less likely possible cholangitis, monitor cultures, bowel rest and monitor.  2. ESRD. On MWF schedule, renal informed.  3. Chronic combined systolic and diastolic CHF EF [id number] with AICD history of nonischemic myopathy. Stable now.  4. Hypoglycemia due to combination of #1 above, diarrhea, poor  oral intake. Now mildly hyperglycemic. Monitor CBGs on ISS check A1c.  Lab Results  Component Value Date   HGBA1C 5.4 12/08/2015   CBG (last 3)   Recent Labs  12/31/15 0421 12/31/15 0722 12/31/15 1135  GLUCAP 342* 236* 262*     5. Tobacco abuse. Counseled to quit.  6. COPD. Currently stable at baseline. Supportive care only.  7. 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 Will request cardiology to evaluate EKG and comment.  8. Gout. Stable. Resume allopurinol once he is clinically better.  9. ? UTI - UA obtained in the ESRD patient, specimen likely dirty and probably colonized, for now Zosyn to continue. Follow cultures.     Family Communication  :  wife  Code Status :  DNR  Diet : Diet NPO time specified Except for: Sips with Meds   Disposition Plan  :  Stepdown  Consults  :  GI  Procedures  :    CT - small pneumobilia, past Cholecystectomy with sphincterotomy, unspecific fluid collection in the abdomen  Vascular ultrasound of the abdomen  DVT Prophylaxis  :    Heparin    Lab Results  Component Value Date   PLT 156 12/31/2015    Inpatient Medications  Scheduled Meds: . aspirin EC  81 mg Oral Daily  . calcium acetate  667 mg Oral Q lunch  . [START ON 01/01/2016] epoetin (EPOGEN/PROCRIT) injection  4,000 Units Subcutaneous Q M,W,F-HD  . feeding supplement  1 Container Oral TID BM  . heparin  5,000 Units Subcutaneous Q8H  . insulin aspart  0-9 Units Subcutaneous TID WC  . lidocaine-prilocaine   Topical Once  . metoprolol succinate  50 mg Oral Daily  . phytonadione   10 mg Subcutaneous Once  . piperacillin-tazobactam (ZOSYN)  IV  3.375 g Intravenous Q12H   Continuous Infusions: PRN Meds:.sodium chloride, alteplase, dextrose, heparin, HYDROcodone-acetaminophen, lidocaine (PF), lidocaine-prilocaine, [DISCONTINUED] ondansetron **OR** ondansetron (ZOFRAN) IV, pentafluoroprop-tetrafluoroeth  Antibiotics  :    Anti-infectives    Start     Dose/Rate Route Frequency Ordered Stop   12/24/2015 2200  piperacillin-tazobactam (ZOSYN) IVPB 3.375 g     3.375 g 12.5 mL/hr over 240 Minutes Intravenous Every 12 hours 01/16/2016 1043     12/27/2015 1800  vancomycin (VANCOCIN) 50 mg/mL oral solution 500 mg  Status:  Discontinued    Comments:  Stool study pending pretty   500 mg Oral Every 6 hours 12/28/2015 1515 01/14/2016 1654   12/20/2015 1045  piperacillin-tazobactam (ZOSYN) IVPB 3.375 g     3.375 g 100 mL/hr over 30 Minutes Intravenous STAT 12/22/2015 1042 12/27/2015 1155   12/27/2015 1045  vancomycin (VANCOCIN) IVPB 1000 mg/200 mL premix     1,000 mg 200 mL/hr over 60 Minutes Intravenous  Once 01/10/2016 1042 01/08/2016 1231         Objective:   Vitals:   12/31/15 0500 12/31/15 0600 12/31/15 0723 12/31/15 1141  BP:      Pulse:      Resp: 19 (!) 24 (!) 23   Temp:   97.4 F (36.3 C) 98 F (36.7 C)  TempSrc:   Oral Axillary  SpO2:      Weight:  83.5 kg (184 lb 1.4 oz)    Height:        Wt Readings from Last 3 Encounters:  12/31/15 83.5 kg (184 lb 1.4 oz)  12/29/15 82.6 kg (182 lb)  12/27/15 84.4 kg (186 lb)     Intake/Output Summary (Last 24 hours) at 12/31/15 1214 Last  data filed at 12/24/2015 2155  Gross per 24 hour  Intake             2750 ml  Output                0 ml  Net             2750 ml     Physical Exam  Awake Alert, Oriented X 3, No new F.N deficits, Normal affect [place].AT,PERRAL Supple Neck,No JVD, No cervical lymphadenopathy appriciated.  Symmetrical Chest wall movement, Good air movement bilaterally, CTAB RRR,No Gallops,Rubs or new Murmurs, No  Parasternal Heave +ve B.Sounds, Abd Soft, mild epigastric tenderness, No organomegaly appriciated, No rebound - guarding or rigidity. No Cyanosis, Clubbing or edema, No new Rash or bruise       Data Review:    CBC  Recent Labs Lab 12/27/15 1012 01/10/2016 0951 01/10/2016 1013 01/12/2016 1704 12/31/15 0453  WBC 12.1*  --  22.5* 24.3* 24.9*  HGB 10.3* 12.2* 10.1* 8.9* 9.6*  HCT 31.2* 36.0* 31.1* 27.9* 29.6*  PLT 199  --  223 169 156  MCV 94.0  --  97.8 96.2 93.1  MCH 31.0  --  31.8 30.7 30.2  MCHC 33.0  --  32.5 31.9 32.4  RDW 17.4*  --  17.7* 17.6* 17.0*  LYMPHSABS 1.0  --  0.4*  --   --   MONOABS 0.8  --  1.5*  --   --   EOSABS 0.2  --  0.0  --   --   BASOSABS 0.0  --  0.0  --   --     Chemistries   Recent Labs Lab 12/27/15 1012 12/22/2015 0951 12/19/2015 1013 12/29/2015 1704 12/31/15 0453  NA 132* 133* 135 130* 134*  K 4.1 6.0* 6.1* 6.0* 4.6  CL 89* 95* 85* 90* 94*  CO2 27  --  16* 14* 22  GLUCOSE 197* 37* 329* 568* 269*  BUN 36* 59* 57* 65* 49*  CREATININE 8.06* 9.40* 9.15* 8.83* 6.06*  CALCIUM 9.1  --  9.3 8.3* 8.9  AST  --   --  1,621* 3,750* 4,590*  ALT  --   --  1,023* 1,978* 2,934*  ALKPHOS  --   --  120 109 123  BILITOT  --   --  1.5* 1.2 0.8   ------------------------------------------------------------------------------------------------------------------ No results for input(s): CHOL, HDL, LDLCALC, TRIG, CHOLHDL, LDLDIRECT in the last 72 hours.  Lab Results  Component Value Date   HGBA1C 5.4 12/08/2015   ------------------------------------------------------------------------------------------------------------------ No results for input(s): TSH, T4TOTAL, T3FREE, THYROIDAB in the last 72 hours.  Invalid input(s): FREET3 ------------------------------------------------------------------------------------------------------------------ No results for input(s): VITAMINB12, FOLATE, FERRITIN, TIBC, IRON, RETICCTPCT in the last 72 hours.  Coagulation  profile  Recent Labs Lab 01/12/2016 1704  INR 2.40    No results for input(s): DDIMER in the last 72 hours.  Cardiac Enzymes  Recent Labs Lab 12/27/15 1012 12/27/15 1327  TROPONINI 0.19* 0.18*   ------------------------------------------------------------------------------------------------------------------    Component Value Date/Time   BNP >4,500.0 (H) 12/27/2015 1012   BNP 1,583.3 (H) 07/12/2011 1823    Micro Results Recent Results (from the past 240 hour(s))  Blood Culture (routine x 2)     Status: None (Preliminary result)   Collection Time: 12/27/2015 10:13 AM  Result Value Ref Range Status   Specimen Description BLOOD LEFT ARM  Final   Special Requests BOTTLES DRAWN AEROBIC AND ANAEROBIC 5 CC EACH  Final   Culture NO GROWTH 1 DAY  Final   Report Status PENDING  Incomplete  Blood Culture (routine x 2)     Status: None (Preliminary result)   Collection Time: 01/05/2016 10:13 AM  Result Value Ref Range Status   Specimen Description BLOOD LEFT ARM  Final   Special Requests BOTTLES DRAWN AEROBIC AND ANAEROBIC 7 CC EACH  Final   Culture NO GROWTH 1 DAY  Final   Report Status PENDING  Incomplete  C difficile quick scan w PCR reflex     Status: None   Collection Time: 01/02/2016  1:56 PM  Result Value Ref Range Status   C Diff antigen NEGATIVE NEGATIVE Final   C Diff toxin NEGATIVE NEGATIVE Final   C Diff interpretation No C. difficile detected.  Final  MRSA PCR Screening     Status: None   Collection Time: 01/09/2016  2:21 PM  Result Value Ref Range Status   MRSA by PCR NEGATIVE NEGATIVE Final    Comment:        The GeneXpert MRSA Assay (FDA approved for NASAL specimens only), is one component of a comprehensive MRSA colonization surveillance program. It is not intended to diagnose MRSA infection nor to guide or monitor treatment for MRSA infections.     Radiology Reports Ct Abdomen Pelvis Wo Contrast  Result Date: 12/22/2015 CLINICAL DATA:  They were called  out for unconscious. Upon arrival they took patients blood sugar and it was 23; sepsis; elevated liver enzymes; ams EXAM: CT ABDOMEN AND PELVIS WITHOUT CONTRAST TECHNIQUE: Multidetector CT imaging of the abdomen and pelvis was performed following the standard protocol without IV contrast. COMPARISON:  CT 01/03/2015 FINDINGS: Lower chest: Lung bases are clear. Hepatobiliary: 3.6 cm cyst in the LEFT lateral hepatic lobe. Pneumobilia in common bile duct and LEFT hepatic lobe. Interval cholecystectomy. No biliary dilatation. Pancreas: Pancreas is normal. No ductal dilatation. No pancreatic inflammation. Spleen: Normal spleen Adrenals/urinary tract: Adrenal glands are mildly thickened. There are several simple fluid attenuation lesions associated with the kidneys. No ureterolithiasis or obstructive uropathy. Foley catheter within collapsed bladder. Stomach/Bowel: Stomach is distended by fluid. No obstructing lesions identified. The small bowel and cecum are normal. The appendix is normal. The colon and rectosigmoid colon are normal. Vascular/Lymphatic: Abdominal aorta is normal caliber with atherosclerotic calcification. There is no retroperitoneal or periportal lymphadenopathy. No pelvic lymphadenopathy. Reproductive: Prostate normal Other: No free fluid. Musculoskeletal: Round well-circumscribed lesion in the LEFT femoral neck has a thin sclerotic rim appears benign. No change from comparison examNo aggressive osseous lesion. IMPRESSION: 1. Large volume of fluid within the stomach suggests gastroparesis 2. Pneumobilia is new from comparison exam. Interval cholecystectomy and presumably a sphincterotomy. 3.  Atherosclerotic calcification of the aorta. 4. Foley catheter in bladder. 5. Stable benign-appearing sclerotic lesion in the LEFT femur. Electronically Signed   By: Suzy Bouchard M.D.   On: 01/07/2016 12:10   Dg Chest 2 View  Result Date: 12/27/2015 CLINICAL DATA:  Cough and shortness of breath. EXAM: CHEST   2 VIEW COMPARISON:  April 02, 2015 FINDINGS: Stable cardiomegaly. The hila and mediastinum are unchanged. No nodules or masses. No focal infiltrates or overt edema. IMPRESSION: No active cardiopulmonary disease. Electronically Signed   By: Dorise Bullion III M.D   On: 12/27/2015 10:17   Ct Head Wo Contrast  Result Date: 01/16/2016 CLINICAL DATA:  Altered mental status.  Hypoglycemia. EXAM: CT HEAD WITHOUT CONTRAST TECHNIQUE: Contiguous axial images were obtained from the base of the skull through the vertex without intravenous contrast. COMPARISON:  None.  FINDINGS: Brain: There is mild to moderate diffuse atrophy. There is no intracranial mass, hemorrhage, extra-axial fluid collection, or midline shift. There is patchy small vessel disease in the centra semiovale bilaterally, primarily posteriorly and superiorly. Elsewhere gray-white compartments appear unremarkable. No acute infarct is evident. There is calcification in each basal ganglia region, a physiologic finding in this age group. Vascular: There is no hyperdense vessel. There are foci of calcification in each carotid siphon region. Skull: The bony calvarium appears intact. Sinuses/Orbits: There is a retention cyst in the left maxillary antrum. There is mild mucosal thickening in several ethmoid air cells bilaterally. Other visualized paranasal sinuses are clear. Orbits appear symmetric bilaterally. Other: There is opacification of several inferior mastoid air cells on the left. Visualized mastoid air cells elsewhere appear clear. There is mild debris in each external auditory canal. IMPRESSION: Atrophy with patchy periventricular small vessel disease. No intracranial mass, hemorrhage, or extra-axial fluid collection. No acute infarct evident. Basal ganglia calcification bilaterally is physiologic in this age group. Foci of arterial vascular calcification noted. Mild left-sided mastoid air cell disease. Probable cerumen in each external auditory canal.  Foci of paranasal sinus disease at several sites. Electronically Signed   By: Lowella Grip III M.D.   On: 01/03/2016 11:00   Dg Chest Port 1 View  Result Date: 12/27/2015 CLINICAL DATA:  Sepsis EXAM: PORTABLE CHEST 1 VIEW COMPARISON:  12/27/2015 FINDINGS: Cardiac shadow is enlarged. A defibrillator is again noted and stable. The lungs are well aerated bilaterally without focal infiltrate or sizable effusion. No acute bony abnormality is seen. IMPRESSION: No active disease. Electronically Signed   By: Inez Catalina M.D.   On: 12/29/2015 10:42    Time Spent in minutes  30   SINGH,PRASHANT K M.D on 12/31/2015 at 12:14 PM  Between 7am to 7pm - Pager - [phone]  After 7pm go to [email or web address] - password [facility]  [facility] -  Office  [phone]

## 2015-12-31 NOTE — Progress Notes (Signed)
[id number] spoke with Izora Gala at [facility] regarding order for central line placement for patient. Order placed, awaiting return call for ETA at this time.

## 2015-12-31 NOTE — Progress Notes (Signed)
Subjective:  Patient resting comfortably. Wife at bedside. Patient complains of diffuse abdominal discomfort after BM. No more vomiting. Currently NPO. Denies Tylenol ingestion except for one on Monday. No pain medication at home. Only new medication was amoxicillin which preceded this illness. Patient was somewhat confused yesterday evening. Thought he was at dialysis.   Objective: Vital signs in last 24 hours: Temp:  [95.2 F (35.1 C)-99.1 F (37.3 C)] 97.4 F (36.3 C) (12/14 0723) Pulse Rate:  [65-74] 73 (12/13 2015) Resp:  [11-26] 23 (12/14 0723) BP: (105-141)/(34-80) 131/51 (12/14 0400) SpO2:  [97 %-100 %] 98 % (12/13 1640) Weight:  [182 lb (82.6 kg)-188 lb 4.4 oz (85.4 kg)] 184 lb 1.4 oz (83.5 kg) (12/14 0600) Last BM Date: 01/05/2016 General:   Alert,  Well-developed, well-nourished, pleasant and cooperative in NAD Head:  Normocephalic and atraumatic. Eyes:  Sclera clear, no icterus.  Abdomen:  Soft, nontender and nondistended.Normal bowel sounds, without guarding, and without rebound.   Extremities:  Without clubbing, deformity or edema. Neurologic:  Alert and  oriented x4;  grossly normal neurologically. Skin:  Intact without significant lesions or rashes. Psych:  Alert and cooperative. Normal mood and affect.  Intake/Output from previous day: 12/13 0701 - 12/14 0700 In: 2800 [IV Piggyback:2800] Out: 0  Intake/Output this shift: No intake/output data recorded.  Lab Results: CBC  Recent Labs  01/04/2016 1013 12/28/2015 1704 12/31/15 0453  WBC 22.5* 24.3* 24.9*  HGB 10.1* 8.9* 9.6*  HCT 31.1* 27.9* 29.6*  MCV 97.8 96.2 93.1  PLT 223 169 156   BMET  Recent Labs  12/21/2015 1013 12/21/2015 1704 12/31/15 0453  NA 135 130* 134*  K 6.1* 6.0* 4.6  CL 85* 90* 94*  CO2 16* 14* 22  GLUCOSE 329* 568* 269*  BUN 57* 65* 49*  CREATININE 9.15* 8.83* 6.06*  CALCIUM 9.3 8.3* 8.9   LFTs  Recent Labs  01/05/2016 1013 12/29/2015 1704 12/31/15 0453  BILITOT 1.5* 1.2 0.8   ALKPHOS 120 109 123  AST 1,621* 3,750* 4,590*  ALT 1,023* 1,978* 2,934*  PROT 6.2* 5.4* 5.8*  ALBUMIN 2.7* 2.4* 2.6*   No results for input(s): LIPASE in the last 72 hours. PT/INR  Recent Labs  12/19/2015 1704  LABPROT 26.6*  INR 2.40      Imaging Studies: Ct Abdomen Pelvis Wo Contrast  Result Date: 12/25/2015 CLINICAL DATA:  They were called out for unconscious. Upon arrival they took patients blood sugar and it was 23; sepsis; elevated liver enzymes; ams EXAM: CT ABDOMEN AND PELVIS WITHOUT CONTRAST TECHNIQUE: Multidetector CT imaging of the abdomen and pelvis was performed following the standard protocol without IV contrast. COMPARISON:  CT 01/03/2015 FINDINGS: Lower chest: Lung bases are clear. Hepatobiliary: 3.6 cm cyst in the LEFT lateral hepatic lobe. Pneumobilia in common bile duct and LEFT hepatic lobe. Interval cholecystectomy. No biliary dilatation. Pancreas: Pancreas is normal. No ductal dilatation. No pancreatic inflammation. Spleen: Normal spleen Adrenals/urinary tract: Adrenal glands are mildly thickened. There are several simple fluid attenuation lesions associated with the kidneys. No ureterolithiasis or obstructive uropathy. Foley catheter within collapsed bladder. Stomach/Bowel: Stomach is distended by fluid. No obstructing lesions identified. The small bowel and cecum are normal. The appendix is normal. The colon and rectosigmoid colon are normal. Vascular/Lymphatic: Abdominal aorta is normal caliber with atherosclerotic calcification. There is no retroperitoneal or periportal lymphadenopathy. No pelvic lymphadenopathy. Reproductive: Prostate normal Other: No free fluid. Musculoskeletal: Round well-circumscribed lesion in the LEFT femoral neck has a thin sclerotic rim appears benign. No  change from comparison examNo aggressive osseous lesion. IMPRESSION: 1. Large volume of fluid within the stomach suggests gastroparesis 2. Pneumobilia is new from comparison exam. Interval  cholecystectomy and presumably a sphincterotomy. 3.  Atherosclerotic calcification of the aorta. 4. Foley catheter in bladder. 5. Stable benign-appearing sclerotic lesion in the LEFT femur. Electronically Signed   By: Suzy Bouchard M.D.   On: 12/22/2015 12:10   Dg Chest 2 View  Result Date: 12/27/2015 CLINICAL DATA:  Cough and shortness of breath. EXAM: CHEST  2 VIEW COMPARISON:  April 02, 2015 FINDINGS: Stable cardiomegaly. The hila and mediastinum are unchanged. No nodules or masses. No focal infiltrates or overt edema. IMPRESSION: No active cardiopulmonary disease. Electronically Signed   By: Dorise Bullion III M.D   On: 12/27/2015 10:17   Ct Head Wo Contrast  Result Date: 01/12/2016 CLINICAL DATA:  Altered mental status.  Hypoglycemia. EXAM: CT HEAD WITHOUT CONTRAST TECHNIQUE: Contiguous axial images were obtained from the base of the skull through the vertex without intravenous contrast. COMPARISON:  None. FINDINGS: Brain: There is mild to moderate diffuse atrophy. There is no intracranial mass, hemorrhage, extra-axial fluid collection, or midline shift. There is patchy small vessel disease in the centra semiovale bilaterally, primarily posteriorly and superiorly. Elsewhere gray-white compartments appear unremarkable. No acute infarct is evident. There is calcification in each basal ganglia region, a physiologic finding in this age group. Vascular: There is no hyperdense vessel. There are foci of calcification in each carotid siphon region. Skull: The bony calvarium appears intact. Sinuses/Orbits: There is a retention cyst in the left maxillary antrum. There is mild mucosal thickening in several ethmoid air cells bilaterally. Other visualized paranasal sinuses are clear. Orbits appear symmetric bilaterally. Other: There is opacification of several inferior mastoid air cells on the left. Visualized mastoid air cells elsewhere appear clear. There is mild debris in each external auditory canal.  IMPRESSION: Atrophy with patchy periventricular small vessel disease. No intracranial mass, hemorrhage, or extra-axial fluid collection. No acute infarct evident. Basal ganglia calcification bilaterally is physiologic in this age group. Foci of arterial vascular calcification noted. Mild left-sided mastoid air cell disease. Probable cerumen in each external auditory canal. Foci of paranasal sinus disease at several sites. Electronically Signed   By: Lowella Grip III M.D.   On: 12/24/2015 11:00   Dg Chest Port 1 View  Result Date: 01/03/2016 CLINICAL DATA:  Sepsis EXAM: PORTABLE CHEST 1 VIEW COMPARISON:  12/27/2015 FINDINGS: Cardiac shadow is enlarged. A defibrillator is again noted and stable. The lungs are well aerated bilaterally without focal infiltrate or sizable effusion. No acute bony abnormality is seen. IMPRESSION: No active disease. Electronically Signed   By: Inez Catalina M.D.   On: 01/10/2016 10:42  [2 weeks]   Assessment: Acute diarrhea which started after he had been on amoxicillin. Cdiff quick scan was negative. Significant hypoglycemia issues at home. Lactic acid significantly elevated, recheck for today pending as patient is difficult stick.   Elevated transaminases with significant climb since admission. Bilirubin and alkphos normal. Viral markers negative. Patient denies significant Tylenol ingestion. DDX includes idiosyncratic drug reaction vs atypical presentation of ischemic hepatitis. Concerning is abnormal INR.    Pneumobilia secondary to prior sphincterotomy and not indicative of cholangitis.  Anemia appears to be due to chronic disease. No evidence of overt GI bleed. Will monitor for any evidence of bleeding.   Plan: 1. Gi pathogen panel. 2. Vitamin K 10mg  [place] X 1. 3. Repeat LFTs, PT/INR in am. 4. Abdominal u/s doppler  study today.  5. Will monitor very closely for signs of fulminant hepatic failure.   Laureen Ochs. Bernarda Caffey [facility] [phone] 12/14/20179:58 AM     LOS: 1 day

## 2015-12-31 NOTE — Progress Notes (Signed)
Crystal, RN called requesting diet order. [name] u/s abd doppler essentially unremarkable. Start full liquids given h/o vomiting/diarrhea. Advance to renal/carb modified tomorrow if tolerated.   Laureen Ochs. Bernarda Caffey [facility] [phone] 12/14/20173:23 PM

## 2015-12-31 NOTE — Progress Notes (Signed)
Subjective: Interval History: has complaints of weakness. Patient denies any nausea or vomiting. Denies also any difficulty breathing..  Objective: Vital signs in last 24 hours: Temp:  [95.2 F (35.1 C)-99.1 F (37.3 C)] 97.4 F (36.3 C) (12/14 0723) Pulse Rate:  [65-74] 73 (12/13 2015) Resp:  [11-26] 23 (12/14 0723) BP: (116-141)/(34-80) 131/51 (12/14 0400) SpO2:  [97 %-100 %] 98 % (12/13 1640) Weight:  [82.6 kg (182 lb)-85.4 kg (188 lb 4.4 oz)] 83.5 kg (184 lb 1.4 oz) (12/14 0600) Weight change:   Intake/Output from previous day: 12/13 0701 - 12/14 0700 In: 2800 [IV Piggyback:2800] Out: 0  Intake/Output this shift: No intake/output data recorded.  General appearance: alert, cooperative and no distress Resp: diminished breath sounds bilaterally Cardio: regular rate and rhythm Extremities: No edema  Lab Results:  Recent Labs  12/21/2015 1704 12/31/15 0453  WBC 24.3* 24.9*  HGB 8.9* 9.6*  HCT 27.9* 29.6*  PLT 169 156   BMET:  Recent Labs  01/12/2016 1704 12/31/15 0453  NA 130* 134*  K 6.0* 4.6  CL 90* 94*  CO2 14* 22  GLUCOSE 568* 269*  BUN 65* 49*  CREATININE 8.83* 6.06*  CALCIUM 8.3* 8.9   No results for input(s): PTH in the last 72 hours. Iron Studies: No results for input(s): IRON, TIBC, TRANSFERRIN, FERRITIN in the last 72 hours.  Studies/Results: Ct Abdomen Pelvis Wo Contrast  Result Date: 12/20/2015 CLINICAL DATA:  They were called out for unconscious. Upon arrival they took patients blood sugar and it was 23; sepsis; elevated liver enzymes; ams EXAM: CT ABDOMEN AND PELVIS WITHOUT CONTRAST TECHNIQUE: Multidetector CT imaging of the abdomen and pelvis was performed following the standard protocol without IV contrast. COMPARISON:  CT 01/03/2015 FINDINGS: Lower chest: Lung bases are clear. Hepatobiliary: 3.6 cm cyst in the LEFT lateral hepatic lobe. Pneumobilia in common bile duct and LEFT hepatic lobe. Interval cholecystectomy. No biliary dilatation.  Pancreas: Pancreas is normal. No ductal dilatation. No pancreatic inflammation. Spleen: Normal spleen Adrenals/urinary tract: Adrenal glands are mildly thickened. There are several simple fluid attenuation lesions associated with the kidneys. No ureterolithiasis or obstructive uropathy. Foley catheter within collapsed bladder. Stomach/Bowel: Stomach is distended by fluid. No obstructing lesions identified. The small bowel and cecum are normal. The appendix is normal. The colon and rectosigmoid colon are normal. Vascular/Lymphatic: Abdominal aorta is normal caliber with atherosclerotic calcification. There is no retroperitoneal or periportal lymphadenopathy. No pelvic lymphadenopathy. Reproductive: Prostate normal Other: No free fluid. Musculoskeletal: Round well-circumscribed lesion in the LEFT femoral neck has a thin sclerotic rim appears benign. No change from comparison examNo aggressive osseous lesion. IMPRESSION: 1. Large volume of fluid within the stomach suggests gastroparesis 2. Pneumobilia is new from comparison exam. Interval cholecystectomy and presumably a sphincterotomy. 3.  Atherosclerotic calcification of the aorta. 4. Foley catheter in bladder. 5. Stable benign-appearing sclerotic lesion in the LEFT femur. Electronically Signed   By: Suzy Bouchard M.D.   On: 01/09/2016 12:10   Ct Head Wo Contrast  Result Date: 12/29/2015 CLINICAL DATA:  Altered mental status.  Hypoglycemia. EXAM: CT HEAD WITHOUT CONTRAST TECHNIQUE: Contiguous axial images were obtained from the base of the skull through the vertex without intravenous contrast. COMPARISON:  None. FINDINGS: Brain: There is mild to moderate diffuse atrophy. There is no intracranial mass, hemorrhage, extra-axial fluid collection, or midline shift. There is patchy small vessel disease in the centra semiovale bilaterally, primarily posteriorly and superiorly. Elsewhere gray-white compartments appear unremarkable. No acute infarct is evident. There  is  calcification in each basal ganglia region, a physiologic finding in this age group. Vascular: There is no hyperdense vessel. There are foci of calcification in each carotid siphon region. Skull: The bony calvarium appears intact. Sinuses/Orbits: There is a retention cyst in the left maxillary antrum. There is mild mucosal thickening in several ethmoid air cells bilaterally. Other visualized paranasal sinuses are clear. Orbits appear symmetric bilaterally. Other: There is opacification of several inferior mastoid air cells on the left. Visualized mastoid air cells elsewhere appear clear. There is mild debris in each external auditory canal. IMPRESSION: Atrophy with patchy periventricular small vessel disease. No intracranial mass, hemorrhage, or extra-axial fluid collection. No acute infarct evident. Basal ganglia calcification bilaterally is physiologic in this age group. Foci of arterial vascular calcification noted. Mild left-sided mastoid air cell disease. Probable cerumen in each external auditory canal. Foci of paranasal sinus disease at several sites. Electronically Signed   By: Lowella Grip III M.D.   On: 01/04/2016 11:00   Dg Chest Port 1 View  Result Date: 12/29/2015 CLINICAL DATA:  Sepsis EXAM: PORTABLE CHEST 1 VIEW COMPARISON:  12/27/2015 FINDINGS: Cardiac shadow is enlarged. A defibrillator is again noted and stable. The lungs are well aerated bilaterally without focal infiltrate or sizable effusion. No acute bony abnormality is seen. IMPRESSION: No active disease. Electronically Signed   By: Inez Catalina M.D.   On: 12/21/2015 10:42    I have reviewed the patient's current medications.  Assessment/Plan: Problem #1 sepsis: Presently patient is afebrile but his white blood cell count remains high. Culture is pending. Patient on antibiotics. Problem #2 end-stage renal disease: Status post hemodialysis yesterday. Problem #3 gastroenteritis: Patient is feeling better. No diarrhea this  morning Problem #4 elevated LFTs: Within followed by GI. Possible ischemic. Problem #5 anemia: His hemoglobin is within our target goal. Next Problem #6 hyperkalemia: Status post hemodialysis yesterday and his potassium is normal. Problem #7. Metabolic bone disease: His calcium is range. Plan: 1] We'll make arrangements for patient to get dialysis tomorrow           2] We'll check compressive metabolic panel and phosphorus           3] We'll try to remove about 2 L if systolic blood pressure remains above 90.     LOS: 1 day   Sharlot Sturkey S 12/31/2015,10:05 AM

## 2015-12-31 NOTE — Progress Notes (Signed)
Results for XAIN, PETSKA (MRN [id number]) as of 12/31/2015 11:13  Ref. Range 12/27/2015 16:49 01/10/2016 19:48 12/19/2015 23:52 12/31/2015 04:21 12/31/2015 07:22  Glucose-Capillary Latest Ref Range: 65 - 99 mg/dL 501 (HH) 317 (H) 359 (H) 342 (H) 236 (H)   Noted that blood sugars have been greater than 300 mg/dl. Noted that patient has been on Lantus in the past and was admitted with low blood sugar. With blood sugars elevated now, recommend starting Lantus 15 units daily (83.5 kg x 0.2 units/kg=16.7 units) weight based dosage. Continue Novolog SENSITIVE correction scale TID & HS and see how blood sugars do. Harvel Ricks RN BSN CDE

## 2015-12-31 NOTE — H&P (Signed)
0722 difficult IV stick, restricted RIGHT extremity and per night shift nurse lab unable to draw some blood samples this AM, MD notified and new order given for central line placement.

## 2016-01-01 ENCOUNTER — Inpatient Hospital Stay (HOSPITAL_COMMUNITY): Payer: Medicare Other

## 2016-01-01 DIAGNOSIS — E162 Hypoglycemia, unspecified: Secondary | ICD-10-CM

## 2016-01-01 DIAGNOSIS — A419 Sepsis, unspecified organism: Secondary | ICD-10-CM

## 2016-01-01 DIAGNOSIS — I420 Dilated cardiomyopathy: Secondary | ICD-10-CM

## 2016-01-01 LAB — GLUCOSE, CAPILLARY
GLUCOSE-CAPILLARY: 121 mg/dL — AB (ref 65–99)
GLUCOSE-CAPILLARY: 141 mg/dL — AB (ref 65–99)
GLUCOSE-CAPILLARY: 210 mg/dL — AB (ref 65–99)
Glucose-Capillary: <10 mg/dL — CL (ref 65–99)
Glucose-Capillary: 68 mg/dL (ref 65–99)
Glucose-Capillary: <10 mg/dL — CL (ref 65–99)
Glucose-Capillary: 78 mg/dL (ref 65–99)
Glucose-Capillary: 159 mg/dL — ABNORMAL HIGH (ref 65–99)
Glucose-Capillary: <10 mg/dL — CL (ref 65–99)

## 2016-01-01 LAB — CBC
HEMATOCRIT: 28.3 % — AB (ref 39.0–52.0)
Hemoglobin: 9 g/dL — ABNORMAL LOW (ref 13.0–17.0)
MCH: 30.4 pg (ref 26.0–34.0)
MCHC: 31.8 g/dL (ref 30.0–36.0)
MCV: 95.6 fL (ref 78.0–100.0)
PLATELETS: 174 10*3/uL (ref 150–400)
RBC: 2.96 MIL/uL — AB (ref 4.22–5.81)
RDW: 17.4 % — ABNORMAL HIGH (ref 11.5–15.5)
WBC: 26.3 10*3/uL — AB (ref 4.0–10.5)

## 2016-01-01 LAB — GASTROINTESTINAL PANEL BY PCR, STOOL (REPLACES STOOL CULTURE)
ASTROVIRUS: NOT DETECTED
Adenovirus F40/41: NOT DETECTED
CAMPYLOBACTER SPECIES: NOT DETECTED
Cryptosporidium: NOT DETECTED
Cyclospora cayetanensis: NOT DETECTED
ENTAMOEBA HISTOLYTICA: NOT DETECTED
ENTEROTOXIGENIC E COLI (ETEC): NOT DETECTED
Enteroaggregative E coli (EAEC): NOT DETECTED
Enteropathogenic E coli (EPEC): NOT DETECTED
Giardia lamblia: NOT DETECTED
NOROVIRUS GI/GII: NOT DETECTED
PLESIMONAS SHIGELLOIDES: NOT DETECTED
ROTAVIRUS A: NOT DETECTED
SAPOVIRUS (I, II, IV, AND V): NOT DETECTED
SHIGA LIKE TOXIN PRODUCING E COLI (STEC): NOT DETECTED
Salmonella species: NOT DETECTED
Shigella/Enteroinvasive E coli (EIEC): NOT DETECTED
VIBRIO CHOLERAE: NOT DETECTED
Vibrio species: NOT DETECTED
Yersinia enterocolitica: NOT DETECTED

## 2016-01-01 LAB — COMPREHENSIVE METABOLIC PANEL
ALT: 2047 U/L — ABNORMAL HIGH (ref 17–63)
ALT: 2002 U/L — AB (ref 17–63)
AST: 1237 U/L — ABNORMAL HIGH (ref 15–41)
AST: 1185 U/L — AB (ref 15–41)
Albumin: 2.4 g/dL — ABNORMAL LOW (ref 3.5–5.0)
Albumin: 2.4 g/dL — ABNORMAL LOW (ref 3.5–5.0)
Alkaline Phosphatase: 121 U/L (ref 38–126)
Alkaline Phosphatase: 112 U/L (ref 38–126)
BUN: 78 mg/dL — ABNORMAL HIGH (ref 6–20)
BUN: 84 mg/dL — AB (ref 6–20)
CALCIUM: 9.8 mg/dL (ref 8.9–10.3)
CO2: 13 mmol/L — AB (ref 22–32)
CO2: 12 mmol/L — AB (ref 22–32)
CREATININE: 8.8 mg/dL — AB (ref 0.61–1.24)
Calcium: 10 mg/dL (ref 8.9–10.3)
Chloride: 92 mmol/L — ABNORMAL LOW (ref 101–111)
Chloride: 91 mmol/L — ABNORMAL LOW (ref 101–111)
Creatinine, Ser: 8.34 mg/dL — ABNORMAL HIGH (ref 0.61–1.24)
GFR calc Af Amer: 6 mL/min — ABNORMAL LOW (ref >60)
GFR calc non Af Amer: 6 mL/min — ABNORMAL LOW (ref >60)
GFR calc non Af Amer: 5 mL/min — ABNORMAL LOW (ref >60)
GFR, EST AFRICAN AMERICAN: 7 mL/min — AB (ref >60)
GLUCOSE: 115 mg/dL — AB (ref 65–99)
GLUCOSE: 147 mg/dL — AB (ref 65–99)
POTASSIUM: 6.1 mmol/L — AB (ref 3.5–5.1)
Potassium: 6.3 mmol/L (ref 3.5–5.1)
SODIUM: 134 mmol/L — AB (ref 135–145)
SODIUM: 132 mmol/L — AB (ref 135–145)
TOTAL PROTEIN: 5.2 g/dL — AB (ref 6.5–8.1)
Total Bilirubin: 1.3 mg/dL — ABNORMAL HIGH (ref 0.3–1.2)
Total Bilirubin: 1.4 mg/dL — ABNORMAL HIGH (ref 0.3–1.2)
Total Protein: 5.2 g/dL — ABNORMAL LOW (ref 6.5–8.1)

## 2016-01-01 LAB — HEMOGLOBIN AND HEMATOCRIT, BLOOD
HEMATOCRIT: 27 % — AB (ref 39.0–52.0)
Hemoglobin: 9.3 g/dL — ABNORMAL LOW (ref 13.0–17.0)

## 2016-01-01 LAB — AMMONIA: Ammonia: 24 umol/L (ref 9–35)

## 2016-01-01 LAB — HEMOGLOBIN A1C
Hgb A1c MFr Bld: 5.8 % — ABNORMAL HIGH (ref 4.8–5.6)
MEAN PLASMA GLUCOSE: 120 mg/dL

## 2016-01-01 LAB — PROTIME-INR
INR: 2.47
Prothrombin Time: 27.2 seconds — ABNORMAL HIGH (ref 11.4–15.2)

## 2016-01-01 LAB — PHOSPHORUS: Phosphorus: 10.4 mg/dL — ABNORMAL HIGH (ref 2.5–4.6)

## 2016-01-01 LAB — OCCULT BLOOD X 1 CARD TO LAB, STOOL: FECAL OCCULT BLD: POSITIVE — AB

## 2016-01-01 LAB — TSH: TSH: 1.763 u[IU]/mL (ref 0.350–4.500)

## 2016-01-01 MED ORDER — DEXTROSE 50 % IV SOLN
INTRAVENOUS | Status: AC
  Filled 2016-01-01: qty 50

## 2016-01-01 MED ORDER — DEXTROSE 50 % IV SOLN
1.0000 | Freq: Once | INTRAVENOUS | Status: AC
  Administered 2016-01-01: 50 mL via INTRAVENOUS

## 2016-01-01 MED ORDER — EPOETIN ALFA 4000 UNIT/ML IJ SOLN
INTRAMUSCULAR | Status: AC
  Administered 2016-01-01: 4000 [IU] via INTRAVENOUS
  Filled 2016-01-01: qty 1

## 2016-01-01 MED ORDER — SODIUM CHLORIDE 0.9 % IV SOLN
80.0000 mg | Freq: Once | INTRAVENOUS | Status: AC
  Administered 2016-01-01: 20:00:00 80 mg via INTRAVENOUS
  Filled 2016-01-01: qty 80

## 2016-01-01 MED ORDER — VITAMIN K1 10 MG/ML IJ SOLN
INTRAMUSCULAR | Status: AC
  Filled 2016-01-01: qty 1

## 2016-01-01 MED ORDER — NICOTINE 21 MG/24HR TD PT24
21.0000 mg | MEDICATED_PATCH | Freq: Every day | TRANSDERMAL | Status: DC
  Administered 2016-01-01 – 2016-01-05 (×5): 21 mg via TRANSDERMAL
  Filled 2016-01-01 (×6): qty 1

## 2016-01-01 MED ORDER — DEXTROSE 5 % IV SOLN
INTRAVENOUS | Status: DC
  Administered 2016-01-01 – 2016-01-02 (×2): via INTRAVENOUS

## 2016-01-01 MED ORDER — SODIUM CHLORIDE 0.9 % IV SOLN
8.0000 mg/h | INTRAVENOUS | Status: DC
  Administered 2016-01-01 – 2016-01-03 (×5): 8 mg/h via INTRAVENOUS
  Filled 2016-01-01 (×7): qty 80

## 2016-01-01 MED ORDER — PANTOPRAZOLE SODIUM 40 MG IV SOLR
INTRAVENOUS | Status: AC
  Filled 2016-01-01: qty 160

## 2016-01-01 MED ORDER — PANTOPRAZOLE SODIUM 40 MG IV SOLR: 40.0000 mg | Freq: Two times a day (BID) | INTRAVENOUS | Status: DC

## 2016-01-01 MED ORDER — VITAMIN K1 10 MG/ML IJ SOLN
5.0000 mg | Freq: Once | INTRAMUSCULAR | Status: AC
  Administered 2016-01-01: 5 mg via INTRAVENOUS
  Filled 2016-01-01: qty 0.5

## 2016-01-01 MED ORDER — HALOPERIDOL LACTATE 5 MG/ML IJ SOLN
2.0000 mg | Freq: Four times a day (QID) | INTRAMUSCULAR | Status: DC | PRN
  Administered 2016-01-01: 2 mg via INTRAVENOUS
  Filled 2016-01-01: qty 1

## 2016-01-01 MED ORDER — EPOETIN ALFA 4000 UNIT/ML IJ SOLN
4000.0000 [IU] | INTRAMUSCULAR | Status: DC
  Administered 2016-01-01: 4000 [IU] via INTRAVENOUS

## 2016-01-01 MED ORDER — ORAL CARE MOUTH RINSE
15.0000 mL | Freq: Two times a day (BID) | OROMUCOSAL | Status: DC
  Administered 2016-01-01 – 2016-01-05 (×7): 15 mL via OROMUCOSAL

## 2016-01-01 NOTE — Progress Notes (Signed)
1158 Patient continues to become agitated at times, pulling at lines and wires and restless. Hand mitts applied at this time. Will continue to monitor.

## 2016-01-01 NOTE — Progress Notes (Addendum)
PROGRESS NOTE                                                                                                                                                                                                             Patient Demographics:    [name], is a 70 y.o. male, DOB - November 26, 1945, [id number]  Admit date - 01/05/2016   Admitting Physician Thurnell Lose, MD  Outpatient Primary MD for the patient is Odette Fraction, MD  LOS - 2  Chief Complaint  Patient presents with  . Hypoglycemia       Brief Narrative    [name]  is a 70 y.o. male, with H/O Nonischemic cardiomyopathy with Chronic combined systolic and diastolic CHF  EF [id number] in 0000000 Has AICD, ESRD MWF schedule, Dyslipidemia, hypertension, gout, BPH, ongoing smoking abuse, sleep apnea and does not wear CPAP, who has had cholecystectomy comes to the hospital with 2 day history of diarrhea, generalized abdominal ache, continued to feel poorly and came to the ER where his initial workup suggested dehydration, sepsis/shock with elevated lactic acid levels of 15, hypoglycemia and elevated liver enzymes. CT scan of the abdomen was nonspecific with some pneumobilia consistent with previous sphincterotomy. GI was consulted and I was requested to admit.  The patient currently is feeling weak however he denies any fever chills, no headache, no chest or abdominal pain, no focal weakness, last bowel movement was early morning 6 AM, no blood in stool or urine no dysuria. No exposure to sick contacts.      Subjective:    [name] today has, No headache, No chest pain, mild abdominal pain - No Nausea, No new weakness tingling or numbness, No Cough - SOB.  He is sleepy this morning.   Assessment  & Plan :     1. Sepsis - with severe hypotension, source not entirely clear, his liver enzymes are elevated and this could be the primary insult or this could be a  reflection of shock liver from hypotension. Ac.Hepatitis panel -ve, Vascular US Nonacute, GI Following, Continue antibiotics covering for less likely possible cholangitis, monitor cultures, bowel rest and monitor.  2. ESRD. On MWF schedule, renal informed.  3. Chronic combined systolic and diastolic CHF EF [id number] with AICD history of nonischemic myopathy. Stable now.  4. Hypoglycemia due to combination of #  1 above, diarrhea, poor oral intake. Subsequently had 36 hours of hyperglycemia, We'll monitor CBGs without any scale as he had even of hypoglycemia in the morning of 01/01/2016 with poor oral intake.  Check TSH-Cortisol.  Lab Results  Component Value Date   HGBA1C 5.8 (H) 12/31/2015   CBG (last 3)   Recent Labs  01/01/16 0029 01/01/16 0749 01/01/16 0810  GLUCAP 141* <10* 121*     5. Tobacco abuse. Counseled to quit.  6. COPD. Currently stable at baseline. Supportive care only.  7. 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 Will request cardiology to evaluate EKG and comment.  8. Gout. Stable. Resume allopurinol once he is clinically better.  9. ? UTI - UA obtained in the ESRD patient, specimen likely dirty and probably colonized, for now Zosyn to continue. Follow cultures.  10. Delirium. Avoid benzodiazepines, Haldol as needed. CT head and Nh3 stable.  11. Addendum at 6.50 pm was called Haem Occ +ve - H&H has dropped marginally, PPI drip, H&H serial, Type screen, GI informed.   Family Communication  :  wife  Code Status :  DNR  Diet : Diet full liquid Room service appropriate? Yes; Fluid consistency: Thin   Disposition Plan  :  Stepdown  Consults  :  GI  Procedures  :    CT - small pneumobilia, past Cholecystectomy with sphincterotomy, unspecific fluid collection in the abdomen  Vascular ultrasound of the abdomen  DVT Prophylaxis  :    Heparin    Lab Results  Component Value Date   PLT 174 01/01/2016    Inpatient Medications  Scheduled Meds: . aspirin EC  81 mg Oral  Daily  . calcium acetate  667 mg Oral Q lunch  . epoetin (EPOGEN/PROCRIT) injection  4,000 Units Intravenous Q M,W,F-HD  . feeding supplement (NEPRO CARB STEADY)  237 mL Oral Q24H  . lidocaine-prilocaine   Topical Once  . metoprolol succinate  50 mg Oral Daily  . nicotine  21 mg Transdermal Daily  . piperacillin-tazobactam (ZOSYN)  IV  3.375 g Intravenous Q12H   Continuous Infusions: PRN Meds:.sodium chloride, alteplase, dextrose, haloperidol lactate, heparin, HYDROcodone-acetaminophen, lidocaine (PF), lidocaine-prilocaine, [DISCONTINUED] ondansetron **OR** ondansetron (ZOFRAN) IV, pentafluoroprop-tetrafluoroeth  Antibiotics  :    Anti-infectives    Start     Dose/Rate Route Frequency Ordered Stop   01/02/2016 2200  piperacillin-tazobactam (ZOSYN) IVPB 3.375 g     3.375 g 12.5 mL/hr over 240 Minutes Intravenous Every 12 hours 01/13/2016 1043     01/11/2016 1800  vancomycin (VANCOCIN) 50 mg/mL oral solution 500 mg  Status:  Discontinued    Comments:  Stool study pending pretty   500 mg Oral Every 6 hours 12/29/2015 1515 12/20/2015 1654   12/26/2015 1045  piperacillin-tazobactam (ZOSYN) IVPB 3.375 g     3.375 g 100 mL/hr over 30 Minutes Intravenous STAT 01/03/2016 1042 01/13/2016 1155   01/09/2016 1045  vancomycin (VANCOCIN) IVPB 1000 mg/200 mL premix     1,000 mg 200 mL/hr over 60 Minutes Intravenous  Once 01/02/2016 1042 01/13/2016 1231         Objective:   Vitals:   01/01/16 0500 01/01/16 0600 01/01/16 0816 01/01/16 0831  BP:  (!) 127/50  (!) 151/49  Pulse:    69  Resp: (!) 27 (!) 21  14  Temp:   97.9 F (36.6 C)   TempSrc:   Axillary   SpO2:    100%  Weight:      Height:  Wt Readings from Last 3 Encounters:  12/31/15 83.5 kg (184 lb 1.4 oz)  12/29/15 82.6 kg (182 lb)  12/27/15 84.4 kg (186 lb)     Intake/Output Summary (Last 24 hours) at 01/01/16 0846 Last data filed at 12/31/15 2152  Gross per 24 hour  Intake              100 ml  Output                0 ml  Net               100 ml     Physical Exam  Nightly sleepy this morning, No new F.N deficits, Normal affect [place].AT,PERRAL Supple Neck,No JVD, No cervical lymphadenopathy appriciated.  Symmetrical Chest wall movement, Good air movement bilaterally, CTAB RRR,No Gallops,Rubs or new Murmurs, No Parasternal Heave +ve B.Sounds, Abd Soft, mild epigastric tenderness, No organomegaly appriciated, No rebound - guarding or rigidity. No Cyanosis, Clubbing or edema, No new Rash or bruise       Data Review:    CBC  Recent Labs Lab 12/27/15 1012 12/29/2015 0951 01/10/2016 1013 01/02/2016 1704 12/31/15 0453 01/01/16 0401  WBC 12.1*  --  22.5* 24.3* 24.9* 26.3*  HGB 10.3* 12.2* 10.1* 8.9* 9.6* 9.0*  HCT 31.2* 36.0* 31.1* 27.9* 29.6* 28.3*  PLT 199  --  223 169 156 174  MCV 94.0  --  97.8 96.2 93.1 95.6  MCH 31.0  --  31.8 30.7 30.2 30.4  MCHC 33.0  --  32.5 31.9 32.4 31.8  RDW 17.4*  --  17.7* 17.6* 17.0* 17.4*  LYMPHSABS 1.0  --  0.4*  --   --   --   MONOABS 0.8  --  1.5*  --   --   --   EOSABS 0.2  --  0.0  --   --   --   BASOSABS 0.0  --  0.0  --   --   --     Chemistries   Recent Labs Lab 12/27/15 1012 01/16/2016 0951 12/22/2015 1013 01/08/2016 1704 12/31/15 0453 01/01/16 0401  NA 132* 133* 135 130* 134* 134*  K 4.1 6.0* 6.1* 6.0* 4.6 6.1*  CL 89* 95* 85* 90* 94* 92*  CO2 27  --  16* 14* 22 13*  GLUCOSE 197* 37* 329* 568* 269* 115*  BUN 36* 59* 57* 65* 49* 78*  CREATININE 8.06* 9.40* 9.15* 8.83* 6.06* 8.34*  CALCIUM 9.1  --  9.3 8.3* 8.9 10.0  AST  --   --  1,621* 3,750* 4,590* 1,237*  ALT  --   --  1,023* 1,978* 2,934* 2,047*  ALKPHOS  --   --  120 109 123 121  BILITOT  --   --  1.5* 1.2 0.8 1.3*   ------------------------------------------------------------------------------------------------------------------ No results for input(s): CHOL, HDL, LDLCALC, TRIG, CHOLHDL, LDLDIRECT in the last 72 hours.  Lab Results  Component Value Date   HGBA1C 5.8 (H) 12/31/2015    ------------------------------------------------------------------------------------------------------------------ No results for input(s): TSH, T4TOTAL, T3FREE, THYROIDAB in the last 72 hours.  Invalid input(s): FREET3 ------------------------------------------------------------------------------------------------------------------ No results for input(s): VITAMINB12, FOLATE, FERRITIN, TIBC, IRON, RETICCTPCT in the last 72 hours.  Coagulation profile  Recent Labs Lab 01/11/2016 1704 12/31/15 1152 01/01/16 0401  INR 2.40 2.14 2.47    No results for input(s): DDIMER in the last 72 hours.  Cardiac Enzymes  Recent Labs Lab 12/27/15 1012 12/27/15 1327  TROPONINI 0.19* 0.18*   ------------------------------------------------------------------------------------------------------------------    Component Value Date/Time  BNP >4,500.0 (H) 12/27/2015 1012   BNP 1,583.3 (H) 07/12/2011 1823    Micro Results Recent Results (from the past 240 hour(s))  Blood Culture (routine x 2)     Status: None (Preliminary result)   Collection Time: 01/05/2016 10:13 AM  Result Value Ref Range Status   Specimen Description BLOOD LEFT ARM  Final   Special Requests BOTTLES DRAWN AEROBIC AND ANAEROBIC 5 CC EACH  Final   Culture NO GROWTH 2 DAYS  Final   Report Status PENDING  Incomplete  Blood Culture (routine x 2)     Status: None (Preliminary result)   Collection Time: 12/31/2015 10:13 AM  Result Value Ref Range Status   Specimen Description BLOOD LEFT ARM  Final   Special Requests BOTTLES DRAWN AEROBIC AND ANAEROBIC 7 CC EACH  Final   Culture NO GROWTH 2 DAYS  Final   Report Status PENDING  Incomplete  C difficile quick scan w PCR reflex     Status: None   Collection Time: 01/12/2016  1:56 PM  Result Value Ref Range Status   C Diff antigen NEGATIVE NEGATIVE Final   C Diff toxin NEGATIVE NEGATIVE Final   C Diff interpretation No C. difficile detected.  Final  MRSA PCR Screening     Status:  None   Collection Time: 01/17/2016  2:21 PM  Result Value Ref Range Status   MRSA by PCR NEGATIVE NEGATIVE Final    Comment:        The GeneXpert MRSA Assay (FDA approved for NASAL specimens only), is one component of a comprehensive MRSA colonization surveillance program. It is not intended to diagnose MRSA infection nor to guide or monitor treatment for MRSA infections.   Gastrointestinal Panel by PCR , Stool     Status: None   Collection Time: 12/31/15  8:22 AM  Result Value Ref Range Status   Campylobacter species NOT DETECTED NOT DETECTED Final   Plesimonas shigelloides NOT DETECTED NOT DETECTED Final   Salmonella species NOT DETECTED NOT DETECTED Final   Yersinia enterocolitica NOT DETECTED NOT DETECTED Final   Vibrio species NOT DETECTED NOT DETECTED Final   Vibrio cholerae NOT DETECTED NOT DETECTED Final   Enteroaggregative E coli (EAEC) NOT DETECTED NOT DETECTED Final   Enteropathogenic E coli (EPEC) NOT DETECTED NOT DETECTED Final   Enterotoxigenic E coli (ETEC) NOT DETECTED NOT DETECTED Final   Shiga like toxin producing E coli (STEC) NOT DETECTED NOT DETECTED Final   Shigella/Enteroinvasive E coli (EIEC) NOT DETECTED NOT DETECTED Final   Cryptosporidium NOT DETECTED NOT DETECTED Final   Cyclospora cayetanensis NOT DETECTED NOT DETECTED Final   Entamoeba histolytica NOT DETECTED NOT DETECTED Final   Giardia lamblia NOT DETECTED NOT DETECTED Final   Adenovirus F40/41 NOT DETECTED NOT DETECTED Final   Astrovirus NOT DETECTED NOT DETECTED Final   Norovirus GI/GII NOT DETECTED NOT DETECTED Final   Rotavirus A NOT DETECTED NOT DETECTED Final   Sapovirus (I, II, IV, and V) NOT DETECTED NOT DETECTED Final    Radiology Reports Ct Abdomen Pelvis Wo Contrast  Result Date: 01/14/2016 CLINICAL DATA:  They were called out for unconscious. Upon arrival they took patients blood sugar and it was 23; sepsis; elevated liver enzymes; ams EXAM: CT ABDOMEN AND PELVIS WITHOUT CONTRAST  TECHNIQUE: Multidetector CT imaging of the abdomen and pelvis was performed following the standard protocol without IV contrast. COMPARISON:  CT 01/03/2015 FINDINGS: Lower chest: Lung bases are clear. Hepatobiliary: 3.6 cm cyst in the LEFT lateral hepatic  lobe. Pneumobilia in common bile duct and LEFT hepatic lobe. Interval cholecystectomy. No biliary dilatation. Pancreas: Pancreas is normal. No ductal dilatation. No pancreatic inflammation. Spleen: Normal spleen Adrenals/urinary tract: Adrenal glands are mildly thickened. There are several simple fluid attenuation lesions associated with the kidneys. No ureterolithiasis or obstructive uropathy. Foley catheter within collapsed bladder. Stomach/Bowel: Stomach is distended by fluid. No obstructing lesions identified. The small bowel and cecum are normal. The appendix is normal. The colon and rectosigmoid colon are normal. Vascular/Lymphatic: Abdominal aorta is normal caliber with atherosclerotic calcification. There is no retroperitoneal or periportal lymphadenopathy. No pelvic lymphadenopathy. Reproductive: Prostate normal Other: No free fluid. Musculoskeletal: Round well-circumscribed lesion in the LEFT femoral neck has a thin sclerotic rim appears benign. No change from comparison examNo aggressive osseous lesion. IMPRESSION: 1. Large volume of fluid within the stomach suggests gastroparesis 2. Pneumobilia is new from comparison exam. Interval cholecystectomy and presumably a sphincterotomy. 3.  Atherosclerotic calcification of the aorta. 4. Foley catheter in bladder. 5. Stable benign-appearing sclerotic lesion in the LEFT femur. Electronically Signed   By: Suzy Bouchard M.D.   On: 12/22/2015 12:10   Dg Chest 2 View  Result Date: 12/27/2015 CLINICAL DATA:  Cough and shortness of breath. EXAM: CHEST  2 VIEW COMPARISON:  April 02, 2015 FINDINGS: Stable cardiomegaly. The hila and mediastinum are unchanged. No nodules or masses. No focal infiltrates or overt  edema. IMPRESSION: No active cardiopulmonary disease. Electronically Signed   By: Dorise Bullion III M.D   On: 12/27/2015 10:17   Ct Head Wo Contrast  Result Date: 01/14/2016 CLINICAL DATA:  Altered mental status.  Hypoglycemia. EXAM: CT HEAD WITHOUT CONTRAST TECHNIQUE: Contiguous axial images were obtained from the base of the skull through the vertex without intravenous contrast. COMPARISON:  None. FINDINGS: Brain: There is mild to moderate diffuse atrophy. There is no intracranial mass, hemorrhage, extra-axial fluid collection, or midline shift. There is patchy small vessel disease in the centra semiovale bilaterally, primarily posteriorly and superiorly. Elsewhere gray-white compartments appear unremarkable. No acute infarct is evident. There is calcification in each basal ganglia region, a physiologic finding in this age group. Vascular: There is no hyperdense vessel. There are foci of calcification in each carotid siphon region. Skull: The bony calvarium appears intact. Sinuses/Orbits: There is a retention cyst in the left maxillary antrum. There is mild mucosal thickening in several ethmoid air cells bilaterally. Other visualized paranasal sinuses are clear. Orbits appear symmetric bilaterally. Other: There is opacification of several inferior mastoid air cells on the left. Visualized mastoid air cells elsewhere appear clear. There is mild debris in each external auditory canal. IMPRESSION: Atrophy with patchy periventricular small vessel disease. No intracranial mass, hemorrhage, or extra-axial fluid collection. No acute infarct evident. Basal ganglia calcification bilaterally is physiologic in this age group. Foci of arterial vascular calcification noted. Mild left-sided mastoid air cell disease. Probable cerumen in each external auditory canal. Foci of paranasal sinus disease at several sites. Electronically Signed   By: Lowella Grip III M.D.   On: 01/13/2016 11:00   [place] Art/ven Flow Abd Pelv  Doppler  Result Date: 12/31/2015 CLINICAL DATA:  On conscious. Sepsis. Elevated liver function tests. EXAM: DUPLEX ULTRASOUND OF LIVER TECHNIQUE: Color and duplex Doppler ultrasound was performed to evaluate the hepatic in-flow and out-flow vessels. COMPARISON:  None. FINDINGS: Portal Vein Velocities Main:  38 cm/sec Right:  40 cm/sec Left:  13 cm/sec Hepatic Vein Velocities Right:  55 cm/sec Middle:  75 cm/sec Left:  65 cm/sec Hepatic  Artery Velocity:  210 cm/sec Splenic Vein Velocity: The patient refused imaging of the splenic vein cm/sec Varices: Absent Ascites: Absent Portal veins are hepatopetal and directionality of flow. Hepatic veins are hepatofugal. IMPRESSION: Hepatic and portal veins are all patent with normal directionality of flow. The patient refused imaging of the splenic vein. Electronically Signed   By: Marybelle Killings M.D.   On: 12/31/2015 14:42   Dg Chest Port 1 View  Result Date: 01/04/2016 CLINICAL DATA:  Sepsis EXAM: PORTABLE CHEST 1 VIEW COMPARISON:  12/27/2015 FINDINGS: Cardiac shadow is enlarged. A defibrillator is again noted and stable. The lungs are well aerated bilaterally without focal infiltrate or sizable effusion. No acute bony abnormality is seen. IMPRESSION: No active disease. Electronically Signed   By: Inez Catalina M.D.   On: 12/20/2015 10:42   US Abdomen Limited Ruq  Result Date: 12/31/2015 CLINICAL DATA:  Elevated liver function tests EXAM: US ABDOMEN LIMITED - RIGHT UPPER QUADRANT COMPARISON:  None. FINDINGS: Gallbladder: Absent. Common bile duct: Diameter: 2.6 mm. Liver: Simple cyst in the left lobe measures 3.8 cm. No solid mass. Liver echotexture is heterogeneous. IMPRESSION: Gallbladder is absent compatible with cholecystectomy. Simple cyst in the liver. No solid mass. Liver echotexture is heterogeneous suggesting diffuse hepatic parenchymal disease. Electronically Signed   By: Marybelle Killings M.D.   On: 12/31/2015 14:44    Time Spent in minutes   30   SINGH,PRASHANT K M.D on 01/01/2016 at 8:46 AM  Between 7am to 7pm - Pager - [phone]  After 7pm go to [email or web address] - password [facility]  [facility] -  Office  [phone]

## 2016-01-01 NOTE — Progress Notes (Addendum)
Subjective: Mostly non-verbal at time of visit. Per RN and wife (at bedside) his blood sugar 'crashed" just now, has been treated. Most of history obtained by wife. No further abdominal pain or vomiting. Per nurse, did have small bowel movement not really diarrhea but not formed. Had minimal clear liquids yesterday, but did tolerate what he had. No fever/chills. No GI bleed noted by family or staff. No further GI complaints.  Objective: Vital signs in last 24 hours: Temp:  [97.4 F (36.3 C)-98.7 F (37.1 C)] 97.5 F (36.4 C) (12/15 0105) Pulse Rate:  [80-92] 80 (12/14 2300) Resp:  [11-29] 21 (12/15 0600) BP: (115-150)/(45-71) 127/50 (12/15 0600) SpO2:  [87 %-100 %] 100 % (12/14 2300) Last BM Date: 12/31/15 General:   Confused at this time, essentially non-verbal; being treated for hypoglycemia. Head:  Normocephalic and atraumatic. Eyes:  No icterus, sclera clear. Conjuctiva pink.  Heart:  S1, S2 present, no murmurs noted.  Lungs: Clear to auscultation bilaterally, without wheezing, rales, or rhonchi.  Abdomen:  Bowel sounds present, soft, non-tender, non-distended. Msk:  Symmetrical without gross deformities. Extremities:  Without clubbing or edema. Neurologic:  Awake but confused. Skin:  Warm and dry, intact without significant lesions.   Intake/Output from previous day: 12/14 0701 - 12/15 0700 In: 100 [IV Piggyback:100] Out: -  Intake/Output this shift: No intake/output data recorded.  Lab Results:  Recent Labs  01/04/2016 1704 12/31/15 0453 01/01/16 0401  WBC 24.3* 24.9* 26.3*  HGB 8.9* 9.6* 9.0*  HCT 27.9* 29.6* 28.3*  PLT 169 156 174   BMET  Recent Labs  01/11/2016 1704 12/31/15 0453 01/01/16 0401  NA 130* 134* 134*  K 6.0* 4.6 6.1*  CL 90* 94* 92*  CO2 14* 22 13*  GLUCOSE 568* 269* 115*  BUN 65* 49* 78*  CREATININE 8.83* 6.06* 8.34*  CALCIUM 8.3* 8.9 10.0   LFT  Recent Labs  01/15/2016 1704 12/31/15 0453 01/01/16 0401  PROT 5.4* 5.8* 5.2*   ALBUMIN 2.4* 2.6* 2.4*  AST 3,750* 4,590* 1,237*  ALT 1,978* 2,934* 2,047*  ALKPHOS 109 123 121  BILITOT 1.2 0.8 1.3*   PT/INR  Recent Labs  12/31/15 1152 01/01/16 0401  LABPROT 24.3* 27.2*  INR 2.14 2.47   Hepatitis Panel  Recent Labs  12/26/2015 1704  HEPBSAG Negative  HCVAB 0.2  HEPAIGM Negative  HEPBIGM Negative     Studies/Results: Ct Abdomen Pelvis Wo Contrast  Result Date: 12/28/2015 CLINICAL DATA:  They were called out for unconscious. Upon arrival they took patients blood sugar and it was 23; sepsis; elevated liver enzymes; ams EXAM: CT ABDOMEN AND PELVIS WITHOUT CONTRAST TECHNIQUE: Multidetector CT imaging of the abdomen and pelvis was performed following the standard protocol without IV contrast. COMPARISON:  CT 01/03/2015 FINDINGS: Lower chest: Lung bases are clear. Hepatobiliary: 3.6 cm cyst in the LEFT lateral hepatic lobe. Pneumobilia in common bile duct and LEFT hepatic lobe. Interval cholecystectomy. No biliary dilatation. Pancreas: Pancreas is normal. No ductal dilatation. No pancreatic inflammation. Spleen: Normal spleen Adrenals/urinary tract: Adrenal glands are mildly thickened. There are several simple fluid attenuation lesions associated with the kidneys. No ureterolithiasis or obstructive uropathy. Foley catheter within collapsed bladder. Stomach/Bowel: Stomach is distended by fluid. No obstructing lesions identified. The small bowel and cecum are normal. The appendix is normal. The colon and rectosigmoid colon are normal. Vascular/Lymphatic: Abdominal aorta is normal caliber with atherosclerotic calcification. There is no retroperitoneal or periportal lymphadenopathy. No pelvic lymphadenopathy. Reproductive: Prostate normal Other: No free fluid. Musculoskeletal:  Round well-circumscribed lesion in the LEFT femoral neck has a thin sclerotic rim appears benign. No change from comparison examNo aggressive osseous lesion. IMPRESSION: 1. Large volume of fluid within  the stomach suggests gastroparesis 2. Pneumobilia is new from comparison exam. Interval cholecystectomy and presumably a sphincterotomy. 3.  Atherosclerotic calcification of the aorta. 4. Foley catheter in bladder. 5. Stable benign-appearing sclerotic lesion in the LEFT femur. Electronically Signed   By: Suzy Bouchard M.D.   On: 12/19/2015 12:10   Ct Head Wo Contrast  Result Date: 01/02/2016 CLINICAL DATA:  Altered mental status.  Hypoglycemia. EXAM: CT HEAD WITHOUT CONTRAST TECHNIQUE: Contiguous axial images were obtained from the base of the skull through the vertex without intravenous contrast. COMPARISON:  None. FINDINGS: Brain: There is mild to moderate diffuse atrophy. There is no intracranial mass, hemorrhage, extra-axial fluid collection, or midline shift. There is patchy small vessel disease in the centra semiovale bilaterally, primarily posteriorly and superiorly. Elsewhere gray-white compartments appear unremarkable. No acute infarct is evident. There is calcification in each basal ganglia region, a physiologic finding in this age group. Vascular: There is no hyperdense vessel. There are foci of calcification in each carotid siphon region. Skull: The bony calvarium appears intact. Sinuses/Orbits: There is a retention cyst in the left maxillary antrum. There is mild mucosal thickening in several ethmoid air cells bilaterally. Other visualized paranasal sinuses are clear. Orbits appear symmetric bilaterally. Other: There is opacification of several inferior mastoid air cells on the left. Visualized mastoid air cells elsewhere appear clear. There is mild debris in each external auditory canal. IMPRESSION: Atrophy with patchy periventricular small vessel disease. No intracranial mass, hemorrhage, or extra-axial fluid collection. No acute infarct evident. Basal ganglia calcification bilaterally is physiologic in this age group. Foci of arterial vascular calcification noted. Mild left-sided mastoid air  cell disease. Probable cerumen in each external auditory canal. Foci of paranasal sinus disease at several sites. Electronically Signed   By: Lowella Grip III M.D.   On: 01/11/2016 11:00   [place] Art/ven Flow Abd Pelv Doppler  Result Date: 12/31/2015 CLINICAL DATA:  On conscious. Sepsis. Elevated liver function tests. EXAM: DUPLEX ULTRASOUND OF LIVER TECHNIQUE: Color and duplex Doppler ultrasound was performed to evaluate the hepatic in-flow and out-flow vessels. COMPARISON:  None. FINDINGS: Portal Vein Velocities Main:  38 cm/sec Right:  40 cm/sec Left:  13 cm/sec Hepatic Vein Velocities Right:  55 cm/sec Middle:  75 cm/sec Left:  65 cm/sec Hepatic Artery Velocity:  210 cm/sec Splenic Vein Velocity: The patient refused imaging of the splenic vein cm/sec Varices: Absent Ascites: Absent Portal veins are hepatopetal and directionality of flow. Hepatic veins are hepatofugal. IMPRESSION: Hepatic and portal veins are all patent with normal directionality of flow. The patient refused imaging of the splenic vein. Electronically Signed   By: Marybelle Killings M.D.   On: 12/31/2015 14:42   Dg Chest Port 1 View  Result Date: 12/31/2015 CLINICAL DATA:  Sepsis EXAM: PORTABLE CHEST 1 VIEW COMPARISON:  12/27/2015 FINDINGS: Cardiac shadow is enlarged. A defibrillator is again noted and stable. The lungs are well aerated bilaterally without focal infiltrate or sizable effusion. No acute bony abnormality is seen. IMPRESSION: No active disease. Electronically Signed   By: Inez Catalina M.D.   On: 01/16/2016 10:42   US Abdomen Limited Ruq  Result Date: 12/31/2015 CLINICAL DATA:  Elevated liver function tests EXAM: US ABDOMEN LIMITED - RIGHT UPPER QUADRANT COMPARISON:  None. FINDINGS: Gallbladder: Absent. Common bile duct: Diameter: 2.6 mm. Liver: Simple  cyst in the left lobe measures 3.8 cm. No solid mass. Liver echotexture is heterogeneous. IMPRESSION: Gallbladder is absent compatible with cholecystectomy. Simple cyst in  the liver. No solid mass. Liver echotexture is heterogeneous suggesting diffuse hepatic parenchymal disease. Electronically Signed   By: Marybelle Killings M.D.   On: 12/31/2015 14:44    Assessment: Acute diarrhea which started after he had been on amoxicillin. Cdiff quick scan was negative. Significant hypoglycemia issues at home. Lactic acid significantly elevated, recheck yesterday improved but remains elevated at 4.9..   Elevated transaminases with significant climb since admission which have started to decline today bt with bump in bilirubin noted; alk phos remains normal. Viral markers negative. Patient denies significant Tylenol ingestion. DDX includes idiosyncratic drug reaction vs atypical presentation of ischemic hepatitis; unlikely autoimmune hepatitis. . Concerning is abnormal INR. Which declined to 2.14 yesterday but today is back up to 2.47.   Pneumobilia secondary to prior sphincterotomy and not indicative of cholangitis.  Anemia appears to be due to chronic disease. No evidence of overt GI bleed. CBC today with mild decline to 9.0, normocytic/normochromic. WBC count slightly increased today to 26.3.  Doppler/U/S done yesterday showed s/p cholecystectomy with normal CBD and simple liver cyst, no mass. Heterogeneous echotexture suggesting diffuse hepatic parenchymal disease. Hepatic and portal veins all patent with normal directionality of flow; refused imaging of splenic vein.  Yesterday requesting diet with no abdominal pain or N/V/D in the evening. Was started on full liquids and consideration to advance to renal/carb modified today if tolerated. He apparently had an episode of significant hypoglycemia just prior to my visit with him and is still recovering from this. Drowsy, minimally verbal. Wife at bedside. Seems otherwise clinically stable/somewhat improved. Tolerating minimal clear liquids, no overt abdominal pain, N/V, GI bleed. Small stool "like a typical gastroenteritis bowel  movement" per the nurse, but not overtly diarrhea or overtly formed. Vitals stable, no hypotension over the last 24 hours.  AST/ALT improving to 1237/2047 (from 4590/2934 yesterday), has had a slight bump in bilirubin to 1.3 from 0.8 yesterday.    Plan: 1. Continue full liquids for now to assess tolerance when he is having more significant intake 2. Monitor LFTs closely; expect AST/ALT to continue to decline; likely continued bump in bili before decline (lag effect) 3. Follow INR closely 4. Monitor for any s/s of liver decompensation 5. Supportive measures    Thank you for allowing [place] to participate in the care of Janan Halter, DNP, AGNP-C Adult & Gerontological Nurse Practitioner [facility]     LOS: 2 days    01/01/2016, 7:06 AM

## 2016-01-01 NOTE — Progress Notes (Signed)
Hypoglycemic Event  CBG: < 10  Treatment: D50 IV 50 mL  Symptoms: Nervous/irritable  Follow-up CBG: Time: 0811 CBG Result: 121  Possible Reasons for Event: Inadequate meal intake  Comments/MD notified: Yes, Dr.Singh    Obryan Radu L

## 2016-01-01 NOTE — Progress Notes (Signed)
1850 spoke with Dr.Singh regarding patient's fecal occult stool positive for blood, CT head negative, ammonia level WNL. New orders entered by Dr.Singh.

## 2016-01-01 NOTE — Progress Notes (Signed)
1822 occult stool positive, Dr.Rehman (on call GI) paged.

## 2016-01-01 NOTE — Progress Notes (Signed)
Hypoglycemic Event  CBG:  <10  Treatment: D50 IV 50 mL  Symptoms: Nervous/irritable  Follow-up CBG: Time: CBG Result: 217 Possible Reasons for Event: Inadequate meal intake  Comments/MD notified: yes Patient started on D5 IVF @ 44mL/hr   [name], [place]

## 2016-01-01 NOTE — Progress Notes (Signed)
[name]  MRN: [id number]  DOB/AGE: 02-18-1945 70 y.o.  Primary Care Physician:[name],[name], MD  Admit date: 01/08/2016  Chief Complaint:  Chief Complaint  Patient presents with  . Hypoglycemia    S-Pt presented on  01/11/2016 with  Chief Complaint  Patient presents with  . Hypoglycemia  .    Pt is lethargic, not offering any new complaints.  meds  . aspirin EC  81 mg Oral Daily  . calcium acetate  667 mg Oral Q lunch  . epoetin (EPOGEN/PROCRIT) injection  4,000 Units Intravenous Q M,W,F-HD  . feeding supplement (NEPRO CARB STEADY)  237 mL Oral Q24H  . lidocaine-prilocaine   Topical Once  . metoprolol succinate  50 mg Oral Daily  . nicotine  21 mg Transdermal Daily  . piperacillin-tazobactam (ZOSYN)  IV  3.375 g Intravenous Q12H       Physical Exam: Vital signs in last 24 hours: Temp:  [97.5 F (36.4 C)-98.7 F (37.1 C)] 97.9 F (36.6 C) (12/15 0816) Pulse Rate:  [69-92] 69 (12/15 0831) Resp:  [11-29] 14 (12/15 0831) BP: (115-151)/(45-71) 151/49 (12/15 0831) SpO2:  [87 %-100 %] 100 % (12/15 0831) Weight change:  Last BM Date: 12/31/15  Intake/Output from previous day: 12/14 0701 - 12/15 0700 In: 100 [IV Piggyback:100] Out: -  No intake/output data recorded.   Physical Exam: General- pt is lethargic, arousable  Resp- No acute REsp distress, Rhonchi + CVS- S1S2 regular ij rate and rhythm GIT- BS+, soft, NT, ND EXT- NO LE Edema, Cyanosis Access- AVF in situ  Lab Results: CBC  Recent Labs  12/31/15 0453 01/01/16 0401  WBC 24.9* 26.3*  HGB 9.6* 9.0*  HCT 29.6* 28.3*  PLT 156 174    BMET  Recent Labs  12/31/15 0453 01/01/16 0401  NA 134* 134*  K 4.6 6.1*  CL 94* 92*  CO2 22 13*  GLUCOSE 269* 115*  BUN 49* 78*  CREATININE 6.06* 8.34*  CALCIUM 8.9 10.0    MICRO Recent Results (from the past 240 hour(s))  Blood Culture (routine x 2)     Status: None (Preliminary result)   Collection Time: 12/19/2015 10:13 AM  Result  Value Ref Range Status   Specimen Description BLOOD LEFT ARM  Final   Special Requests BOTTLES DRAWN AEROBIC AND ANAEROBIC 5 CC EACH  Final   Culture NO GROWTH 2 DAYS  Final   Report Status PENDING  Incomplete  Blood Culture (routine x 2)     Status: None (Preliminary result)   Collection Time: 01/13/2016 10:13 AM  Result Value Ref Range Status   Specimen Description BLOOD LEFT ARM  Final   Special Requests BOTTLES DRAWN AEROBIC AND ANAEROBIC 7 CC EACH  Final   Culture NO GROWTH 2 DAYS  Final   Report Status PENDING  Incomplete  C difficile quick scan w PCR reflex     Status: None   Collection Time: 12/22/2015  1:56 PM  Result Value Ref Range Status   C Diff antigen NEGATIVE NEGATIVE Final   C Diff toxin NEGATIVE NEGATIVE Final   C Diff interpretation No C. difficile detected.  Final  MRSA PCR Screening     Status: None   Collection Time: 01/14/2016  2:21 PM  Result Value Ref Range Status   MRSA by PCR NEGATIVE NEGATIVE Final    Comment:        The GeneXpert MRSA Assay (FDA approved for NASAL specimens only), is one component of a comprehensive MRSA colonization surveillance  program. It is not intended to diagnose MRSA infection nor to guide or monitor treatment for MRSA infections.   Gastrointestinal Panel by PCR , Stool     Status: None   Collection Time: 12/31/15  8:22 AM  Result Value Ref Range Status   Campylobacter species NOT DETECTED NOT DETECTED Final   Plesimonas shigelloides NOT DETECTED NOT DETECTED Final   Salmonella species NOT DETECTED NOT DETECTED Final   Yersinia enterocolitica NOT DETECTED NOT DETECTED Final   Vibrio species NOT DETECTED NOT DETECTED Final   Vibrio cholerae NOT DETECTED NOT DETECTED Final   Enteroaggregative E coli (EAEC) NOT DETECTED NOT DETECTED Final   Enteropathogenic E coli (EPEC) NOT DETECTED NOT DETECTED Final   Enterotoxigenic E coli (ETEC) NOT DETECTED NOT DETECTED Final   Shiga like toxin producing E coli (STEC) NOT DETECTED NOT  DETECTED Final   Shigella/Enteroinvasive E coli (EIEC) NOT DETECTED NOT DETECTED Final   Cryptosporidium NOT DETECTED NOT DETECTED Final   Cyclospora cayetanensis NOT DETECTED NOT DETECTED Final   Entamoeba histolytica NOT DETECTED NOT DETECTED Final   Giardia lamblia NOT DETECTED NOT DETECTED Final   Adenovirus F40/41 NOT DETECTED NOT DETECTED Final   Astrovirus NOT DETECTED NOT DETECTED Final   Norovirus GI/GII NOT DETECTED NOT DETECTED Final   Rotavirus A NOT DETECTED NOT DETECTED Final   Sapovirus (I, II, IV, and V) NOT DETECTED NOT DETECTED Final      Lab Results  Component Value Date   PTH 372 (H) 01/08/2015   CALCIUM 10.0 01/01/2016   CAION 0.91 (L) 01/09/2016   PHOS 10.4 (H) 01/01/2016               Impression: 1)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 Calcium is at goal  5)ID-admitted with sepsis/septic shock On broad spectrum ABX Primary  MD following    6)Electrolytes  Hyperkalemic     Sec to ESRD   Hyponatremic   7)Acid base Co2 now at goal Admitted with High AG acidosis sec to lactic acidosis Now better      Plan:  Will dialyze today Will use 2 k bath -This should help with hyperkalemia .      Lesta Limbert S 01/01/2016, 8:55 AM

## 2016-01-01 NOTE — Progress Notes (Signed)
1535 patient had large loose stool, black in color with a coffee ground appearance. Dr.Rourk aware and new order given to obtain stool sample for occult stool test. Sample obtained and taken to lab.

## 2016-01-01 NOTE — Procedures (Signed)
   HEMODIALYSIS TREATMENT NOTE:  4 hour dialysis completed via right forearm AVF (15g/antegrade). Goal NOT met: ultrafiltration rate was decreased with declining BPs. Net UF 1.5L.  Pt had episode of bowel incontinence with 15 minutes RTD.  Sustained venous needle infiltration while being rolled side to side to clean/change linens.  RTD 8 minutes at this point. Unable to return blood; EBL 300cc.  BP 131/42 post-HD.  Report given to Aldona Lento, RN.   Rockwell Alexandria, RN, CDN

## 2016-01-01 NOTE — Progress Notes (Signed)
md notified of increased agitation after taking ativan. Added ativan as intolerance in emr.

## 2016-01-01 NOTE — Progress Notes (Signed)
Hypoglycemic Event  CBG: 68  Treatment: D50 IV 50 mL  Symptoms: Nervous/irritable  Follow-up CBG: Time: 0946 CBG Result: 78  Possible Reasons for Event: Inadequate meal intake  Comments/MD notified: Yes    Madylyn Insco L

## 2016-01-02 DIAGNOSIS — E162 Hypoglycemia, unspecified: Secondary | ICD-10-CM

## 2016-01-02 DIAGNOSIS — D62 Acute posthemorrhagic anemia: Secondary | ICD-10-CM

## 2016-01-02 DIAGNOSIS — A09 Infectious gastroenteritis and colitis, unspecified: Secondary | ICD-10-CM

## 2016-01-02 DIAGNOSIS — R7989 Other specified abnormal findings of blood chemistry: Secondary | ICD-10-CM

## 2016-01-02 DIAGNOSIS — A419 Sepsis, unspecified organism: Secondary | ICD-10-CM

## 2016-01-02 LAB — COMPREHENSIVE METABOLIC PANEL
ALBUMIN: 1.9 g/dL — AB (ref 3.5–5.0)
ALT: 1461 U/L — ABNORMAL HIGH (ref 17–63)
AST: 819 U/L — AB (ref 15–41)
Alkaline Phosphatase: 94 U/L (ref 38–126)
Anion gap: 13 (ref 5–15)
BUN: 55 mg/dL — AB (ref 6–20)
CHLORIDE: 90 mmol/L — AB (ref 101–111)
CO2: 23 mmol/L (ref 22–32)
Calcium: 8.2 mg/dL — ABNORMAL LOW (ref 8.9–10.3)
Creatinine, Ser: 5.59 mg/dL — ABNORMAL HIGH (ref 0.61–1.24)
GFR calc Af Amer: 11 mL/min — ABNORMAL LOW (ref >60)
GFR, EST NON AFRICAN AMERICAN: 9 mL/min — AB (ref >60)
Glucose, Bld: 479 mg/dL — ABNORMAL HIGH (ref 65–99)
POTASSIUM: 3.8 mmol/L (ref 3.5–5.1)
SODIUM: 126 mmol/L — AB (ref 135–145)
Total Bilirubin: 1.6 mg/dL — ABNORMAL HIGH (ref 0.3–1.2)
Total Protein: 4.2 g/dL — ABNORMAL LOW (ref 6.5–8.1)

## 2016-01-02 LAB — GLUCOSE, CAPILLARY
GLUCOSE-CAPILLARY: 145 mg/dL — AB (ref 65–99)
GLUCOSE-CAPILLARY: 260 mg/dL — AB (ref 65–99)
Glucose-Capillary: 153 mg/dL — ABNORMAL HIGH (ref 65–99)
Glucose-Capillary: 183 mg/dL — ABNORMAL HIGH (ref 65–99)
Glucose-Capillary: 186 mg/dL — ABNORMAL HIGH (ref 65–99)
Glucose-Capillary: 339 mg/dL — ABNORMAL HIGH (ref 65–99)
Glucose-Capillary: <10 mg/dL — CL (ref 65–99)

## 2016-01-02 LAB — CBC
HEMATOCRIT: 22.7 % — AB (ref 39.0–52.0)
Hemoglobin: 7.5 g/dL — ABNORMAL LOW (ref 13.0–17.0)
MCH: 30.6 pg (ref 26.0–34.0)
MCHC: 33 g/dL (ref 30.0–36.0)
MCV: 92.7 fL (ref 78.0–100.0)
Platelets: 96 10*3/uL — ABNORMAL LOW (ref 150–400)
RBC: 2.45 MIL/uL — AB (ref 4.22–5.81)
RDW: 18 % — AB (ref 11.5–15.5)
WBC: 18.3 10*3/uL — AB (ref 4.0–10.5)

## 2016-01-02 LAB — CORTISOL: CORTISOL PLASMA: 53.5 ug/dL

## 2016-01-02 LAB — HEMOGLOBIN AND HEMATOCRIT, BLOOD
HEMATOCRIT: 28.1 % — AB (ref 39.0–52.0)
HEMATOCRIT: 26.1 % — AB (ref 39.0–52.0)
HEMOGLOBIN: 9.7 g/dL — AB (ref 13.0–17.0)
Hemoglobin: 8.9 g/dL — ABNORMAL LOW (ref 13.0–17.0)

## 2016-01-02 LAB — PREPARE RBC (CROSSMATCH)

## 2016-01-02 LAB — MAGNESIUM: Magnesium: 2 mg/dL (ref 1.7–2.4)

## 2016-01-02 LAB — PROTIME-INR
INR: 2.28
Prothrombin Time: 25.5 seconds — ABNORMAL HIGH (ref 11.4–15.2)

## 2016-01-02 LAB — ABO/RH: ABO/RH(D): B POS

## 2016-01-02 MED ORDER — SODIUM CHLORIDE 0.9 % IV SOLN
Freq: Once | INTRAVENOUS | Status: AC
  Administered 2016-01-02: 10:00:00 via INTRAVENOUS

## 2016-01-02 MED ORDER — DEXTROSE 50 % IV SOLN
INTRAVENOUS | Status: AC
  Administered 2016-01-02: 50 mL
  Filled 2016-01-02: qty 50

## 2016-01-02 MED ORDER — DIPHENHYDRAMINE HCL 50 MG/ML IJ SOLN: 25.0000 mg | Freq: Four times a day (QID) | INTRAMUSCULAR | Status: DC | PRN

## 2016-01-02 MED ORDER — SODIUM CHLORIDE 0.9 % IV SOLN: INTRAVENOUS | Status: DC

## 2016-01-02 MED ORDER — PANTOPRAZOLE SODIUM 40 MG IV SOLR
INTRAVENOUS | Status: AC
  Filled 2016-01-02: qty 80

## 2016-01-02 MED ORDER — VITAMIN K1 10 MG/ML IJ SOLN
5.0000 mg | Freq: Once | INTRAMUSCULAR | Status: AC
  Administered 2016-01-02: 5 mg via SUBCUTANEOUS
  Filled 2016-01-02: qty 1

## 2016-01-02 MED ORDER — DEXTROSE 50 % IV SOLN
INTRAVENOUS | Status: AC
  Filled 2016-01-02: qty 50

## 2016-01-02 NOTE — Progress Notes (Signed)
  Subjective:  Patient denies chest pain shortness of breath or nausea. He also denies abdominal pain. His sister who is at bedside states he recognizes or immediately and she feels he is doing better today.  Objective: Blood pressure (!) 135/46, pulse 79, temperature 97.9 F (36.6 C), temperature source Axillary, resp. rate (!) 23, height 5\' 9"  (1.753 m), weight 184 lb 15.5 oz (83.9 kg), SpO2 99 %. Patient is drowsy but responds appropriately to questions. JVD is not elevated. Abdomen abdomen is symmetrical. Bowel sounds are normal. No bruit noted. Abdomen is soft and nontender. No LE edema or clubbing noted. Both his feet are cold.  Labs/studies Results:   Recent Labs  12/31/15 0453 01/01/16 0401 01/01/16 1904 01/02/16 0058 01/02/16 0441  WBC 24.9* 26.3*  --   --  18.3*  HGB 9.6* 9.0* 9.3* 8.9* 7.5*  HCT 29.6* 28.3* 27.0* 26.1* 22.7*  PLT 156 174  --   --  96*    BMET   Recent Labs  01/01/16 0401 01/01/16 0855 01/02/16 0441  NA 134* 132* 126*  K 6.1* 6.3* 3.8  CL 92* 91* 90*  CO2 13* 12* 23  GLUCOSE 115* 147* 479*  BUN 78* 84* 55*  CREATININE 8.34* 8.80* 5.59*  CALCIUM 10.0 9.8 8.2*    LFT   Recent Labs  01/01/16 0401 01/01/16 0855 01/02/16 0441  PROT 5.2* 5.2* 4.2*  ALBUMIN 2.4* 2.4* 1.9*  AST 1,237* 1,185* 819*  ALT 2,047* 2,002* 1,461*  ALKPHOS 121 112 94  BILITOT 1.3* 1.4* 1.6*    PT/INR   Recent Labs  01/01/16 0401 01/02/16 0441  LABPROT 27.2* 25.5*  INR 2.47 2.28      Assessment:  #1. Hepatocellular injury with impressive rise in transaminases appears to be secondary to ischemic hepatitis. Etiology would appear to be splanchnic ischemia and severe nonischemic cardiomyopathy. Continued improvement in transaminases. #2. Coagulopathy secondary to acute hepatic injury. INR has partially corrected with vitamin K. #3. Acute on chronic anemia. Hemoglobin has dropped by more than 2-1/2 g since admission. Stool is guaiac positive. Patient is  receiving a unit of PRBCs. He could have peptic ulcer disease #4. Lethargy. Serum ammonia yesterday was normal. Lethargy may be due to medications. #5. ? Sepsis. Cultures remain negative. Patient remains on Zosyn.   Recommendations:  Vitamin K 5 mg SQ 1 today. Advance diet to full liquids. Check INR in a.m. EGD in a.m.

## 2016-01-02 NOTE — Progress Notes (Signed)
CBG 10. 1 AMP D50-50ML GIVEN IV PUSH.

## 2016-01-02 NOTE — Progress Notes (Signed)
CBG NOW 339.

## 2016-01-02 NOTE — Progress Notes (Signed)
PROGRESS NOTE                                                                                                                                                                                                             Patient Demographics:    [name], is a 69 y.o. male, DOB - 11-Aug-1945, [id number]  Admit date - 01/12/2016   Admitting Physician Thurnell Lose, MD  Outpatient Primary MD for the patient is Odette Fraction, MD  LOS - 3  Chief Complaint  Patient presents with  . Hypoglycemia       Brief Narrative    [name]  is a 69 y.o. male, with H/O Nonischemic cardiomyopathy with Chronic combined systolic and diastolic CHF  EF [id number] in 0000000 Has AICD, ESRD MWF schedule, Dyslipidemia, hypertension, gout, BPH, ongoing smoking abuse, sleep apnea and does not wear CPAP, who has had cholecystectomy comes to the hospital with 2 day history of diarrhea, generalized abdominal ache, continued to feel poorly and came to the ER where his initial workup suggested dehydration, sepsis/shock with elevated lactic acid levels of 15, hypoglycemia and elevated liver enzymes. CT scan of the abdomen was nonspecific with some pneumobilia consistent with previous sphincterotomy. GI was consulted and I was requested to admit.  The patient currently is feeling weak however he denies any fever chills, no headache, no chest or abdominal pain, no focal weakness, last bowel movement was early morning 6 AM, no blood in stool or urine no dysuria. No exposure to sick contacts.      Subjective:    [name] today has, No headache, No chest pain, mild abdominal pain - No Nausea, No new weakness tingling or numbness, No Cough - SOB.  He is sleepy this morning.   Assessment  & Plan :     1. Sepsis - with severe hypotension, source not entirely clear, his liver enzymes are elevated and this could be the primary insult or this could be a  reflection of shock liver from hypotension. Ac.Hepatitis panel -ve, Vascular US Nonacute, GI Following, Continue antibiotics covering for less likely possible cholangitis, monitor cultures, on clear liquids and monitor. Liver enzymes are trending towards improvement.  2. ESRD. On MWF schedule, renal informed.  3. Chronic combined systolic and diastolic CHF EF [id number] with AICD history of nonischemic myopathy. Stable now.  4. Hypoglycemia due to combination of #1 above, diarrhea, poor oral intake. Subsequently had 36 hours of hyperglycemia, We'll monitor CBGs without any scale as he had even of hypoglycemia in the morning of 01/01/2016 with poor oral intake. Stable TSH, pending cortisol.  Lab Results  Component Value Date   HGBA1C 5.8 (H) 12/31/2015   CBG (last 3)   Recent Labs  01/02/16 0401 01/02/16 0432 01/02/16 0755  GLUCAP <10* 339* 145*     5. Tobacco abuse. Counseled to quit.  6. COPD. Currently stable at baseline. Supportive care only.  7. 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 Will request cardiology to evaluate EKG and comment.  8. Gout. Stable. Resume allopurinol once he is clinically better.  9. ? UTI - UA obtained in the ESRD patient, specimen likely dirty and probably colonized, for now Zosyn to continue. Follow cultures.  10. Delirium. Avoid benzodiazepines, Haldol as needed. CT head and Ammonia stable.  11. Anemia of chronic disease along with likely occult GI blood loss related anemia. He is now Hemoccult-positive, hemoglobin has dropped about 4 g since admission, he is getting 1 unit of PRBC transfused on 01/02/2016, he is on IV PPI drip currently, GI following, he does have some epigastric discomfort and I question if this is upper GI bleed. Further management per GI. Will monitor H&H.   Family Communication  :  wife  Code Status :  DNR  Diet : Diet full liquid Room service appropriate? Yes; Fluid consistency: Thin   Disposition Plan  :  Stepdown  Consults  :  GI,  Renal  Procedures  :    CT - small pneumobilia, past Cholecystectomy with sphincterotomy, unspecific fluid collection in the abdomen  Vascular ultrasound of the abdomen - non acute  CT Head - non acute   DVT Prophylaxis  :    Heparin    Lab Results  Component Value Date   PLT 96 (L) 01/02/2016    Inpatient Medications  Scheduled Meds: . aspirin EC  81 mg Oral Daily  . calcium acetate  667 mg Oral Q lunch  . epoetin (EPOGEN/PROCRIT) injection  4,000 Units Intravenous Q M,W,F-HD  . feeding supplement (NEPRO CARB STEADY)  237 mL Oral Q24H  . lidocaine-prilocaine   Topical Once  . mouth rinse  15 mL Mouth Rinse BID  . metoprolol succinate  50 mg Oral Daily  . nicotine  21 mg Transdermal Daily  . [START ON 01/05/2016] pantoprazole  40 mg Intravenous Q12H  . piperacillin-tazobactam (ZOSYN)  IV  3.375 g Intravenous Q12H   Continuous Infusions: . dextrose 30 mL/hr at 01/02/16 0900  . pantoprozole (PROTONIX) infusion 8 mg/hr (01/02/16 0800)   PRN Meds:.sodium chloride, alteplase, dextrose, diphenhydrAMINE, haloperidol lactate, heparin, HYDROcodone-acetaminophen, lidocaine (PF), lidocaine-prilocaine, [DISCONTINUED] ondansetron **OR** ondansetron (ZOFRAN) IV, pentafluoroprop-tetrafluoroeth  Antibiotics  :    Anti-infectives    Start     Dose/Rate Route Frequency Ordered Stop   01/05/2016 2200  piperacillin-tazobactam (ZOSYN) IVPB 3.375 g     3.375 g 12.5 mL/hr over 240 Minutes Intravenous Every 12 hours 01/12/2016 1043     01/16/2016 1800  vancomycin (VANCOCIN) 50 mg/mL oral solution 500 mg  Status:  Discontinued    Comments:  Stool study pending pretty   500 mg Oral Every 6 hours 12/24/2015 1515 01/10/2016 1654   01/07/2016 1045  piperacillin-tazobactam (ZOSYN) IVPB 3.375 g     3.375 g 100 mL/hr over 30 Minutes Intravenous STAT 12/31/2015 1042 01/05/2016 1155   01/05/2016 1045  vancomycin (VANCOCIN)  IVPB 1000 mg/200 mL premix     1,000 mg 200 mL/hr over 60 Minutes Intravenous  Once 01/17/2016  1042 01/15/2016 1231         Objective:   Vitals:   01/02/16 0700 01/02/16 0800 01/02/16 0900 01/02/16 0943  BP: (!) 140/45 (!) 133/45 (!) 136/47 (!) 135/43  Pulse: 80 75 78 80  Resp: (!) 21 20 (!) 21 19  Temp:  98 F (36.7 C)  97.9 F (36.6 C)  TempSrc:  Axillary  Axillary  SpO2: 99% 98% 100% 98%  Weight:      Height:        Wt Readings from Last 3 Encounters:  01/02/16 83.9 kg (184 lb 15.5 oz)  12/29/15 82.6 kg (182 lb)  12/27/15 84.4 kg (186 lb)     Intake/Output Summary (Last 24 hours) at 01/02/16 0953 Last data filed at 01/02/16 0900  Gross per 24 hour  Intake             1250 ml  Output                0 ml  Net             1250 ml     Physical Exam  less sleepy this morning, No new F.N deficits, Normal affect [place].AT,PERRAL Supple Neck,No JVD, No cervical lymphadenopathy appriciated.  Symmetrical Chest wall movement, Good air movement bilaterally, CTAB RRR,No Gallops,Rubs or new Murmurs, No Parasternal Heave +ve B.Sounds, Abd Soft, mild epigastric tenderness, No organomegaly appriciated, No rebound - guarding or rigidity. No Cyanosis, Clubbing or edema, No new Rash or bruise       Data Review:    CBC  Recent Labs Lab 12/27/15 1012  12/29/2015 1013 01/13/2016 1704 12/31/15 0453 01/01/16 0401 01/01/16 1904 01/02/16 0058 01/02/16 0441  WBC 12.1*  --  22.5* 24.3* 24.9* 26.3*  --   --  18.3*  HGB 10.3*  < > 10.1* 8.9* 9.6* 9.0* 9.3* 8.9* 7.5*  HCT 31.2*  < > 31.1* 27.9* 29.6* 28.3* 27.0* 26.1* 22.7*  PLT 199  --  223 169 156 174  --   --  96*  MCV 94.0  --  97.8 96.2 93.1 95.6  --   --  92.7  MCH 31.0  --  31.8 30.7 30.2 30.4  --   --  30.6  MCHC 33.0  --  32.5 31.9 32.4 31.8  --   --  33.0  RDW 17.4*  --  17.7* 17.6* 17.0* 17.4*  --   --  18.0*  LYMPHSABS 1.0  --  0.4*  --   --   --   --   --   --   MONOABS 0.8  --  1.5*  --   --   --   --   --   --   EOSABS 0.2  --  0.0  --   --   --   --   --   --   BASOSABS 0.0  --  0.0  --   --   --   --   --    --   < > = values in this interval not displayed.  Chemistries   Recent Labs Lab 12/26/2015 1704 12/31/15 0453 01/01/16 0401 01/01/16 0855 01/02/16 0441  NA 130* 134* 134* 132* 126*  K 6.0* 4.6 6.1* 6.3* 3.8  CL 90* 94* 92* 91* 90*  CO2 14* 22 13* 12* 23  GLUCOSE 568* 269* 115*  147* 479*  BUN 65* 49* 78* 84* 55*  CREATININE 8.83* 6.06* 8.34* 8.80* 5.59*  CALCIUM 8.3* 8.9 10.0 9.8 8.2*  MG  --   --   --   --  2.0  AST 3,750* 4,590* 1,237* 1,185* 819*  ALT 1,978* 2,934* 2,047* 2,002* 1,461*  ALKPHOS 109 123 121 112 94  BILITOT 1.2 0.8 1.3* 1.4* 1.6*   ------------------------------------------------------------------------------------------------------------------ No results for input(s): CHOL, HDL, LDLCALC, TRIG, CHOLHDL, LDLDIRECT in the last 72 hours.  Lab Results  Component Value Date   HGBA1C 5.8 (H) 12/31/2015   ------------------------------------------------------------------------------------------------------------------  Recent Labs  01/01/16 1904  TSH 1.763   ------------------------------------------------------------------------------------------------------------------ No results for input(s): VITAMINB12, FOLATE, FERRITIN, TIBC, IRON, RETICCTPCT in the last 72 hours.  Coagulation profile  Recent Labs Lab 12/21/2015 1704 12/31/15 1152 01/01/16 0401 01/02/16 0441  INR 2.40 2.14 2.47 2.28    No results for input(s): DDIMER in the last 72 hours.  Cardiac Enzymes  Recent Labs Lab 12/27/15 1012 12/27/15 1327  TROPONINI 0.19* 0.18*   ------------------------------------------------------------------------------------------------------------------    Component Value Date/Time   BNP >4,500.0 (H) 12/27/2015 1012   BNP 1,583.3 (H) 07/12/2011 1823    Micro Results Recent Results (from the past 240 hour(s))  Blood Culture (routine x 2)     Status: None (Preliminary result)   Collection Time: 01/08/2016 10:13 AM  Result Value Ref Range Status    Specimen Description BLOOD LEFT ARM  Final   Special Requests BOTTLES DRAWN AEROBIC AND ANAEROBIC 5 CC EACH  Final   Culture NO GROWTH 2 DAYS  Final   Report Status PENDING  Incomplete  Blood Culture (routine x 2)     Status: None (Preliminary result)   Collection Time: 01/04/2016 10:13 AM  Result Value Ref Range Status   Specimen Description BLOOD LEFT ARM  Final   Special Requests BOTTLES DRAWN AEROBIC AND ANAEROBIC 7 CC EACH  Final   Culture NO GROWTH 2 DAYS  Final   Report Status PENDING  Incomplete  C difficile quick scan w PCR reflex     Status: None   Collection Time: 12/24/2015  1:56 PM  Result Value Ref Range Status   C Diff antigen NEGATIVE NEGATIVE Final   C Diff toxin NEGATIVE NEGATIVE Final   C Diff interpretation No C. difficile detected.  Final  MRSA PCR Screening     Status: None   Collection Time: 12/25/2015  2:21 PM  Result Value Ref Range Status   MRSA by PCR NEGATIVE NEGATIVE Final    Comment:        The GeneXpert MRSA Assay (FDA approved for NASAL specimens only), is one component of a comprehensive MRSA colonization surveillance program. It is not intended to diagnose MRSA infection nor to guide or monitor treatment for MRSA infections.   Gastrointestinal Panel by PCR , Stool     Status: None   Collection Time: 12/31/15  8:22 AM  Result Value Ref Range Status   Campylobacter species NOT DETECTED NOT DETECTED Final   Plesimonas shigelloides NOT DETECTED NOT DETECTED Final   Salmonella species NOT DETECTED NOT DETECTED Final   Yersinia enterocolitica NOT DETECTED NOT DETECTED Final   Vibrio species NOT DETECTED NOT DETECTED Final   Vibrio cholerae NOT DETECTED NOT DETECTED Final   Enteroaggregative E coli (EAEC) NOT DETECTED NOT DETECTED Final   Enteropathogenic E coli (EPEC) NOT DETECTED NOT DETECTED Final   Enterotoxigenic E coli (ETEC) NOT DETECTED NOT DETECTED Final   Shiga like toxin producing  E coli (STEC) NOT DETECTED NOT DETECTED Final    Shigella/Enteroinvasive E coli (EIEC) NOT DETECTED NOT DETECTED Final   Cryptosporidium NOT DETECTED NOT DETECTED Final   Cyclospora cayetanensis NOT DETECTED NOT DETECTED Final   Entamoeba histolytica NOT DETECTED NOT DETECTED Final   Giardia lamblia NOT DETECTED NOT DETECTED Final   Adenovirus F40/41 NOT DETECTED NOT DETECTED Final   Astrovirus NOT DETECTED NOT DETECTED Final   Norovirus GI/GII NOT DETECTED NOT DETECTED Final   Rotavirus A NOT DETECTED NOT DETECTED Final   Sapovirus (I, II, IV, and V) NOT DETECTED NOT DETECTED Final    Radiology Reports Ct Abdomen Pelvis Wo Contrast  Result Date: 01/14/2016 CLINICAL DATA:  They were called out for unconscious. Upon arrival they took patients blood sugar and it was 23; sepsis; elevated liver enzymes; ams EXAM: CT ABDOMEN AND PELVIS WITHOUT CONTRAST TECHNIQUE: Multidetector CT imaging of the abdomen and pelvis was performed following the standard protocol without IV contrast. COMPARISON:  CT 01/03/2015 FINDINGS: Lower chest: Lung bases are clear. Hepatobiliary: 3.6 cm cyst in the LEFT lateral hepatic lobe. Pneumobilia in common bile duct and LEFT hepatic lobe. Interval cholecystectomy. No biliary dilatation. Pancreas: Pancreas is normal. No ductal dilatation. No pancreatic inflammation. Spleen: Normal spleen Adrenals/urinary tract: Adrenal glands are mildly thickened. There are several simple fluid attenuation lesions associated with the kidneys. No ureterolithiasis or obstructive uropathy. Foley catheter within collapsed bladder. Stomach/Bowel: Stomach is distended by fluid. No obstructing lesions identified. The small bowel and cecum are normal. The appendix is normal. The colon and rectosigmoid colon are normal. Vascular/Lymphatic: Abdominal aorta is normal caliber with atherosclerotic calcification. There is no retroperitoneal or periportal lymphadenopathy. No pelvic lymphadenopathy. Reproductive: Prostate normal Other: No free fluid.  Musculoskeletal: Round well-circumscribed lesion in the LEFT femoral neck has a thin sclerotic rim appears benign. No change from comparison examNo aggressive osseous lesion. IMPRESSION: 1. Large volume of fluid within the stomach suggests gastroparesis 2. Pneumobilia is new from comparison exam. Interval cholecystectomy and presumably a sphincterotomy. 3.  Atherosclerotic calcification of the aorta. 4. Foley catheter in bladder. 5. Stable benign-appearing sclerotic lesion in the LEFT femur. Electronically Signed   By: Suzy Bouchard M.D.   On: 01/12/2016 12:10   Dg Chest 2 View  Result Date: 12/27/2015 CLINICAL DATA:  Cough and shortness of breath. EXAM: CHEST  2 VIEW COMPARISON:  April 02, 2015 FINDINGS: Stable cardiomegaly. The hila and mediastinum are unchanged. No nodules or masses. No focal infiltrates or overt edema. IMPRESSION: No active cardiopulmonary disease. Electronically Signed   By: Dorise Bullion III M.D   On: 12/27/2015 10:17   Ct Head Wo Contrast  Result Date: 01/01/2016 CLINICAL DATA:  Disoriented EXAM: CT HEAD WITHOUT CONTRAST TECHNIQUE: Contiguous axial images were obtained from the base of the skull through the vertex without intravenous contrast. COMPARISON:  01/05/2016 FINDINGS: Brain: No acute territorial infarction, intracranial hemorrhage or extra-axial fluid collection. No focal mass, mass effect or midline shift. Mild atrophy. Mild periventricular white matter hypodensity consistent with small vessel disease. Smudgy hypodensities within the pons could also relate to small vessel change. Mild hypodensity at the left cerebellar peduncle is unchanged. Ventricles are similar in size and morphology. Bilateral basal ganglial calcifications. Vascular: No hyperdense vessels.  Carotid artery calcifications. Skull: Mastoid air cells demonstrate minimal fluid on the left. No skull fracture. Sinuses/Orbits: Mucosal thickening or retention cyst in the left maxillary sinus. No acute  orbital abnormality. Other: None IMPRESSION: Stable CT appearance of the brain, no acute  interval findings. Electronically Signed   By: Donavan Foil M.D.   On: 01/01/2016 18:48   Ct Head Wo Contrast  Result Date: 12/25/2015 CLINICAL DATA:  Altered mental status.  Hypoglycemia. EXAM: CT HEAD WITHOUT CONTRAST TECHNIQUE: Contiguous axial images were obtained from the base of the skull through the vertex without intravenous contrast. COMPARISON:  None. FINDINGS: Brain: There is mild to moderate diffuse atrophy. There is no intracranial mass, hemorrhage, extra-axial fluid collection, or midline shift. There is patchy small vessel disease in the centra semiovale bilaterally, primarily posteriorly and superiorly. Elsewhere gray-white compartments appear unremarkable. No acute infarct is evident. There is calcification in each basal ganglia region, a physiologic finding in this age group. Vascular: There is no hyperdense vessel. There are foci of calcification in each carotid siphon region. Skull: The bony calvarium appears intact. Sinuses/Orbits: There is a retention cyst in the left maxillary antrum. There is mild mucosal thickening in several ethmoid air cells bilaterally. Other visualized paranasal sinuses are clear. Orbits appear symmetric bilaterally. Other: There is opacification of several inferior mastoid air cells on the left. Visualized mastoid air cells elsewhere appear clear. There is mild debris in each external auditory canal. IMPRESSION: Atrophy with patchy periventricular small vessel disease. No intracranial mass, hemorrhage, or extra-axial fluid collection. No acute infarct evident. Basal ganglia calcification bilaterally is physiologic in this age group. Foci of arterial vascular calcification noted. Mild left-sided mastoid air cell disease. Probable cerumen in each external auditory canal. Foci of paranasal sinus disease at several sites. Electronically Signed   By: Lowella Grip III M.D.    On: 12/28/2015 11:00   [place] Art/ven Flow Abd Pelv Doppler  Result Date: 12/31/2015 CLINICAL DATA:  On conscious. Sepsis. Elevated liver function tests. EXAM: DUPLEX ULTRASOUND OF LIVER TECHNIQUE: Color and duplex Doppler ultrasound was performed to evaluate the hepatic in-flow and out-flow vessels. COMPARISON:  None. FINDINGS: Portal Vein Velocities Main:  38 cm/sec Right:  40 cm/sec Left:  13 cm/sec Hepatic Vein Velocities Right:  55 cm/sec Middle:  75 cm/sec Left:  65 cm/sec Hepatic Artery Velocity:  210 cm/sec Splenic Vein Velocity: The patient refused imaging of the splenic vein cm/sec Varices: Absent Ascites: Absent Portal veins are hepatopetal and directionality of flow. Hepatic veins are hepatofugal. IMPRESSION: Hepatic and portal veins are all patent with normal directionality of flow. The patient refused imaging of the splenic vein. Electronically Signed   By: Marybelle Killings M.D.   On: 12/31/2015 14:42   Dg Chest Port 1 View  Result Date: 01/01/2016 CLINICAL DATA:  Shortness of breath, CHF EXAM: PORTABLE CHEST 1 VIEW COMPARISON:  01/17/2016 FINDINGS: Right jugular central venous catheter with the tip projecting over the SVC. There is no focal parenchymal opacity. There is no pleural effusion or pneumothorax. There is mild stable cardiomegaly. There is a single lead cardiac pacemaker. The osseous structures are unremarkable. IMPRESSION: No active disease. Stable cardiomegaly. Electronically Signed   By: Kathreen Devoid   On: 01/01/2016 09:09   Dg Chest Port 1 View  Result Date: 01/03/2016 CLINICAL DATA:  Sepsis EXAM: PORTABLE CHEST 1 VIEW COMPARISON:  12/27/2015 FINDINGS: Cardiac shadow is enlarged. A defibrillator is again noted and stable. The lungs are well aerated bilaterally without focal infiltrate or sizable effusion. No acute bony abnormality is seen. IMPRESSION: No active disease. Electronically Signed   By: Inez Catalina M.D.   On: 01/04/2016 10:42   US Abdomen Limited Ruq  Result Date:  12/31/2015 CLINICAL DATA:  Elevated liver function tests  EXAM: US ABDOMEN LIMITED - RIGHT UPPER QUADRANT COMPARISON:  None. FINDINGS: Gallbladder: Absent. Common bile duct: Diameter: 2.6 mm. Liver: Simple cyst in the left lobe measures 3.8 cm. No solid mass. Liver echotexture is heterogeneous. IMPRESSION: Gallbladder is absent compatible with cholecystectomy. Simple cyst in the liver. No solid mass. Liver echotexture is heterogeneous suggesting diffuse hepatic parenchymal disease. Electronically Signed   By: Marybelle Killings M.D.   On: 12/31/2015 14:44    Time Spent in minutes  30   SINGH,PRASHANT K M.D on 01/02/2016 at 9:53 AM  Between 7am to 7pm - Pager - [phone]  After 7pm go to [email or web address] - password [facility]  [facility] -  Office  [phone]

## 2016-01-02 NOTE — Progress Notes (Signed)
Subjective: Interval History: The patient is somewhat somnolent. He complains of some leg pain otherwise feels okay.  Objective: Vital signs in last 24 hours: Temp:  [96.9 F (36.1 C)-98.1 F (36.7 C)] 98 F (36.7 C) (12/16 0800) Pulse Rate:  [67-86] 78 (12/16 0900) Resp:  [15-29] 21 (12/16 0900) BP: (68-140)/(30-75) 136/47 (12/16 0900) SpO2:  [61 %-100 %] 100 % (12/16 0900) Weight:  [83.9 kg (184 lb 15.5 oz)-84.6 kg (186 lb 8.2 oz)] 83.9 kg (184 lb 15.5 oz) (12/16 0400) Weight change:   Intake/Output from previous day: 12/15 0701 - 12/16 0700 In: 1075 [I.V.:875; IV Piggyback:200] Out: -  Intake/Output this shift: Total I/O In: 175 [I.V.:175] Out: -   Patient as stated above seems to be more somnolent. He is arousable and answers questions. Chest: Decreased breath sound otherwise seems to be clear Heart exam revealed regular rate and rhythm no murmur Extremities no edema  Lab Results:  Recent Labs  01/01/16 0401  01/02/16 0058 01/02/16 0441  WBC 26.3*  --   --  18.3*  HGB 9.0*  < > 8.9* 7.5*  HCT 28.3*  < > 26.1* 22.7*  PLT 174  --   --  96*  < > = values in this interval not displayed. BMET:   Recent Labs  01/01/16 0855 01/02/16 0441  NA 132* 126*  K 6.3* 3.8  CL 91* 90*  CO2 12* 23  GLUCOSE 147* 479*  BUN 84* 55*  CREATININE 8.80* 5.59*  CALCIUM 9.8 8.2*   No results for input(s): PTH in the last 72 hours. Iron Studies: No results for input(s): IRON, TIBC, TRANSFERRIN, FERRITIN in the last 72 hours.  Studies/Results: Ct Head Wo Contrast  Result Date: 01/01/2016 CLINICAL DATA:  Disoriented EXAM: CT HEAD WITHOUT CONTRAST TECHNIQUE: Contiguous axial images were obtained from the base of the skull through the vertex without intravenous contrast. COMPARISON:  12/21/2015 FINDINGS: Brain: No acute territorial infarction, intracranial hemorrhage or extra-axial fluid collection. No focal mass, mass effect or midline shift. Mild atrophy. Mild periventricular  white matter hypodensity consistent with small vessel disease. Smudgy hypodensities within the pons could also relate to small vessel change. Mild hypodensity at the left cerebellar peduncle is unchanged. Ventricles are similar in size and morphology. Bilateral basal ganglial calcifications. Vascular: No hyperdense vessels.  Carotid artery calcifications. Skull: Mastoid air cells demonstrate minimal fluid on the left. No skull fracture. Sinuses/Orbits: Mucosal thickening or retention cyst in the left maxillary sinus. No acute orbital abnormality. Other: None IMPRESSION: Stable CT appearance of the brain, no acute interval findings. Electronically Signed   By: Donavan Foil M.D.   On: 01/01/2016 18:48   [place] Art/ven Flow Abd Pelv Doppler  Result Date: 12/31/2015 CLINICAL DATA:  On conscious. Sepsis. Elevated liver function tests. EXAM: DUPLEX ULTRASOUND OF LIVER TECHNIQUE: Color and duplex Doppler ultrasound was performed to evaluate the hepatic in-flow and out-flow vessels. COMPARISON:  None. FINDINGS: Portal Vein Velocities Main:  38 cm/sec Right:  40 cm/sec Left:  13 cm/sec Hepatic Vein Velocities Right:  55 cm/sec Middle:  75 cm/sec Left:  65 cm/sec Hepatic Artery Velocity:  210 cm/sec Splenic Vein Velocity: The patient refused imaging of the splenic vein cm/sec Varices: Absent Ascites: Absent Portal veins are hepatopetal and directionality of flow. Hepatic veins are hepatofugal. IMPRESSION: Hepatic and portal veins are all patent with normal directionality of flow. The patient refused imaging of the splenic vein. Electronically Signed   By: Marybelle Killings M.D.   On: 12/31/2015 14:42  Dg Chest Port 1 View  Result Date: 01/01/2016 CLINICAL DATA:  Shortness of breath, CHF EXAM: PORTABLE CHEST 1 VIEW COMPARISON:  12/25/2015 FINDINGS: Right jugular central venous catheter with the tip projecting over the SVC. There is no focal parenchymal opacity. There is no pleural effusion or pneumothorax. There is mild  stable cardiomegaly. There is a single lead cardiac pacemaker. The osseous structures are unremarkable. IMPRESSION: No active disease. Stable cardiomegaly. Electronically Signed   By: Kathreen Devoid   On: 01/01/2016 09:09   US Abdomen Limited Ruq  Result Date: 12/31/2015 CLINICAL DATA:  Elevated liver function tests EXAM: US ABDOMEN LIMITED - RIGHT UPPER QUADRANT COMPARISON:  None. FINDINGS: Gallbladder: Absent. Common bile duct: Diameter: 2.6 mm. Liver: Simple cyst in the left lobe measures 3.8 cm. No solid mass. Liver echotexture is heterogeneous. IMPRESSION: Gallbladder is absent compatible with cholecystectomy. Simple cyst in the liver. No solid mass. Liver echotexture is heterogeneous suggesting diffuse hepatic parenchymal disease. Electronically Signed   By: Marybelle Killings M.D.   On: 12/31/2015 14:44    I have reviewed the patient's current medications.  Assessment/Plan: Problem #1 sepsis: Presently patient is afebrile . His white blood cell count is high but improving. His blood pressure seems to be stable. Problem #2 end-stage renal disease: Status post hemodialysis yesterday. Patient denies any nausea or vomiting. Problem #3 gastroenteritis: Patient is feeling better.  Problem #4 elevated LFTs: Within followed by GI. Possible ischemic. Problem #5 anemia: His hemoglobin is low and worsening. Problem #6 hyperkalemia: Status post hemodialysis yesterday and his potassium is normal. Problem #7. Metabolic bone disease: His calcium is range. Plan: 1] patient doesn't require dialysis today           2] We'll check compressive metabolic panel and phosphorus           3] we'll check his CBC in the morning.     LOS: 3 days   Kharma Sampsel S 01/02/2016,9:35 AM

## 2016-01-03 ENCOUNTER — Inpatient Hospital Stay (HOSPITAL_COMMUNITY): Payer: Medicare Other

## 2016-01-03 ENCOUNTER — Encounter (HOSPITAL_COMMUNITY): Payer: Self-pay | Admitting: *Deleted

## 2016-01-03 ENCOUNTER — Encounter (HOSPITAL_COMMUNITY): Admission: EM | Disposition: E | Payer: Self-pay | Source: Home / Self Care | Attending: Internal Medicine

## 2016-01-03 DIAGNOSIS — K3189 Other diseases of stomach and duodenum: Secondary | ICD-10-CM

## 2016-01-03 DIAGNOSIS — K2210 Ulcer of esophagus without bleeding: Secondary | ICD-10-CM

## 2016-01-03 DIAGNOSIS — D62 Acute posthemorrhagic anemia: Secondary | ICD-10-CM

## 2016-01-03 DIAGNOSIS — K269 Duodenal ulcer, unspecified as acute or chronic, without hemorrhage or perforation: Secondary | ICD-10-CM

## 2016-01-03 DIAGNOSIS — K259 Gastric ulcer, unspecified as acute or chronic, without hemorrhage or perforation: Secondary | ICD-10-CM

## 2016-01-03 HISTORY — PX: ESOPHAGOGASTRODUODENOSCOPY: SHX5428

## 2016-01-03 LAB — COMPREHENSIVE METABOLIC PANEL
ALT: 1124 U/L — ABNORMAL HIGH (ref 17–63)
ANION GAP: 13 (ref 5–15)
AST: 377 U/L — ABNORMAL HIGH (ref 15–41)
Albumin: 2 g/dL — ABNORMAL LOW (ref 3.5–5.0)
Alkaline Phosphatase: 109 U/L (ref 38–126)
BUN: 74 mg/dL — ABNORMAL HIGH (ref 6–20)
CHLORIDE: 97 mmol/L — AB (ref 101–111)
CO2: 25 mmol/L (ref 22–32)
Calcium: 8.6 mg/dL — ABNORMAL LOW (ref 8.9–10.3)
Creatinine, Ser: 7.63 mg/dL — ABNORMAL HIGH (ref 0.61–1.24)
GFR calc non Af Amer: 6 mL/min — ABNORMAL LOW (ref >60)
GFR, EST AFRICAN AMERICAN: 7 mL/min — AB (ref >60)
Glucose, Bld: 239 mg/dL — ABNORMAL HIGH (ref 65–99)
Potassium: 3.9 mmol/L (ref 3.5–5.1)
SODIUM: 135 mmol/L (ref 135–145)
Total Bilirubin: 1.5 mg/dL — ABNORMAL HIGH (ref 0.3–1.2)
Total Protein: 4.4 g/dL — ABNORMAL LOW (ref 6.5–8.1)

## 2016-01-03 LAB — KOH PREP

## 2016-01-03 LAB — GLUCOSE, CAPILLARY
GLUCOSE-CAPILLARY: 205 mg/dL — AB (ref 65–99)
GLUCOSE-CAPILLARY: 221 mg/dL — AB (ref 65–99)
Glucose-Capillary: 238 mg/dL — ABNORMAL HIGH (ref 65–99)
Glucose-Capillary: 236 mg/dL — ABNORMAL HIGH (ref 65–99)
Glucose-Capillary: 110 mg/dL — ABNORMAL HIGH (ref 65–99)
Glucose-Capillary: 154 mg/dL — ABNORMAL HIGH (ref 65–99)

## 2016-01-03 LAB — CBC
HCT: 27.4 % — ABNORMAL LOW (ref 39.0–52.0)
HEMOGLOBIN: 9.4 g/dL — AB (ref 13.0–17.0)
MCH: 30.7 pg (ref 26.0–34.0)
MCHC: 34.3 g/dL (ref 30.0–36.0)
MCV: 89.5 fL (ref 78.0–100.0)
Platelets: 82 10*3/uL — ABNORMAL LOW (ref 150–400)
RBC: 3.06 MIL/uL — AB (ref 4.22–5.81)
RDW: 19.9 % — ABNORMAL HIGH (ref 11.5–15.5)
WBC: 16.9 10*3/uL — AB (ref 4.0–10.5)

## 2016-01-03 LAB — PHOSPHORUS: PHOSPHORUS: 5.7 mg/dL — AB (ref 2.5–4.6)

## 2016-01-03 SURGERY — EGD (ESOPHAGOGASTRODUODENOSCOPY)
Anesthesia: Moderate Sedation

## 2016-01-03 MED ORDER — MEPERIDINE HCL 50 MG/ML IJ SOLN
INTRAMUSCULAR | Status: AC
  Filled 2016-01-03: qty 1

## 2016-01-03 MED ORDER — MAGNESIUM SULFATE IN D5W 1-5 GM/100ML-% IV SOLN
1.0000 g | Freq: Once | INTRAVENOUS | Status: AC
  Administered 2016-01-03: 1 g via INTRAVENOUS
  Filled 2016-01-03: qty 100

## 2016-01-03 MED ORDER — MIDAZOLAM HCL 5 MG/5ML IJ SOLN
INTRAMUSCULAR | Status: DC | PRN
  Administered 2016-01-03 (×3): 1 mg via INTRAVENOUS

## 2016-01-03 MED ORDER — BUTAMBEN-TETRACAINE-BENZOCAINE 2-2-14 % EX AERO
INHALATION_SPRAY | CUTANEOUS | Status: DC | PRN
  Administered 2016-01-03: 2 via TOPICAL

## 2016-01-03 MED ORDER — IOPAMIDOL (ISOVUE-370) INJECTION 76%
150.0000 mL | Freq: Once | INTRAVENOUS | Status: AC | PRN
  Administered 2016-01-03: 150 mL via INTRAVENOUS

## 2016-01-03 MED ORDER — POTASSIUM CHLORIDE CRYS ER 20 MEQ PO TBCR
20.0000 meq | EXTENDED_RELEASE_TABLET | Freq: Once | ORAL | Status: AC
  Administered 2016-01-03: 20 meq via ORAL
  Filled 2016-01-03: qty 1

## 2016-01-03 MED ORDER — DEXTROSE 5 % IV SOLN
INTRAVENOUS | Status: AC
  Administered 2016-01-03: 1000 mL via INTRAVENOUS
  Administered 2016-01-04 – 2016-01-05 (×2): via INTRAVENOUS

## 2016-01-03 MED ORDER — MIDAZOLAM HCL 5 MG/5ML IJ SOLN
INTRAMUSCULAR | Status: AC
  Filled 2016-01-03: qty 10

## 2016-01-03 MED ORDER — MAGNESIUM SULFATE IN D5W 1-5 GM/100ML-% IV SOLN
INTRAVENOUS | Status: AC
  Filled 2016-01-03: qty 100

## 2016-01-03 MED ORDER — STERILE WATER FOR IRRIGATION IR SOLN
Status: DC | PRN
  Administered 2016-01-03: 09:00:00

## 2016-01-03 NOTE — Progress Notes (Signed)
Pharmacy Antibiotic Note  [name] is a 70 y.o. male admitted on 01/04/2016 with sepsis.  Pharmacy has been consulted for ZOSYN dosing. Day #5 of zosyn. Liver function is improving, leukocytosis improving, he remained afebrile throughout. Plan is to deescalate tx in next 24-48 hours  Plan: Continue Zosyn 3.375gm IV q12hrs (adjusted for dialysis) Monitor labs, progress, c/s  Height: 5\' 9"  (175.3 cm) Weight: 185 lb 6.5 oz (84.1 kg) IBW/kg (Calculated) : 70.7  Temp (24hrs), Avg:97.8 F (36.6 C), Min:97.3 F (36.3 C), Max:98.1 F (36.7 C)   Recent Labs Lab 01/05/2016 0950  01/16/2016 1013 12/22/2015 1252 12/28/2015 1704 12/31/15 0453 12/31/15 1152 01/01/16 0401 01/01/16 0855 01/02/16 0441 01/02/2016 0410  WBC  --   < > 22.5*  --  24.3* 24.9*  --  26.3*  --  18.3* 16.9*  CREATININE  --   < > 9.15*  --  8.83* 6.06*  --  8.34* 8.80* 5.59* 7.63*  LATICACIDVEN >17.00*  --  15.9* 13.6* 12.0*  --  4.9*  --   --   --   --   < > = values in this interval not displayed.  Estimated Creatinine Clearance (by C-G formula based on SCr of 7.63 mg/dL (H)) Male: 7.7 mL/min Male: 8.8 mL/min    Allergies  Allergen Reactions  . Ace Inhibitors Cough  . Ativan [Lorazepam] Other (See Comments)    Increased agitation, hallucinations   Antimicrobials this admission: Zosyn 12/13 >>  Vancomycin 12/13 x 1  Dose adjustments this admission: 12/13  Microbiology results:  12/13 BCx: ngtd 12/13  MRSA PCR: negative  Thank you for allowing pharmacy to be a part of this patient's care.  Isac Sarna, BS [place], [place] Clinical Pharmacist Pager [phone] 12/19/2015 12:04 PM

## 2016-01-03 NOTE — Progress Notes (Signed)
Subjective: Interval History: Patient came from endoscopy and somolent  Objective: Vital signs in last 24 hours: Temp:  [97.6 F (36.4 C)-98.1 F (36.7 C)] 98.1 F (36.7 C) (12/17 0400) Pulse Rate:  [79-90] 81 (12/17 0940) Resp:  [11-23] 21 (12/17 0940) BP: (118-145)/(40-70) 133/48 (12/17 0935) SpO2:  [94 %-100 %] 100 % (12/17 0940) Weight:  [84.1 kg (185 lb 6.5 oz)] 84.1 kg (185 lb 6.5 oz) (12/17 0500) Weight change: -0.5 kg (-1 lb 1.6 oz)  Intake/Output from previous day: 12/16 0701 - 12/17 0700 In: 1810.8 [I.V.:1385; Blood:375.8; IV Piggyback:50] Out: -  Intake/Output this shift: Total I/O In: 60 [I.V.:60] Out: -   Patient : Somolent Chest: Decreased breath sound otherwise seems to be clear Heart exam revealed regular rate and rhythm no murmur Extremities no edema  Lab Results:  Recent Labs  01/02/16 0441 01/02/16 1446 12/20/2015 0410  WBC 18.3*  --  16.9*  HGB 7.5* 9.7* 9.4*  HCT 22.7* 28.1* 27.4*  PLT 96*  --  82*   BMET:   Recent Labs  01/02/16 0441 01/13/2016 0410  NA 126* 135  K 3.8 3.9  CL 90* 97*  CO2 23 25  GLUCOSE 479* 239*  BUN 55* 74*  CREATININE 5.59* 7.63*  CALCIUM 8.2* 8.6*   No results for input([name]): PTH in the last 72 hours. Iron Studies: No results for input([name]): IRON, TIBC, TRANSFERRIN, FERRITIN in the last 72 hours.  Studies/Results: Ct Head Wo Contrast  Result Date: 01/01/2016 CLINICAL DATA:  Disoriented EXAM: CT HEAD WITHOUT CONTRAST TECHNIQUE: Contiguous axial images were obtained from the base of the skull through the vertex without intravenous contrast. COMPARISON:  01/14/2016 FINDINGS: Brain: No acute territorial infarction, intracranial hemorrhage or extra-axial fluid collection. No focal mass, mass effect or midline shift. Mild atrophy. Mild periventricular white matter hypodensity consistent with small vessel disease. Smudgy hypodensities within the pons could also relate to small vessel change. Mild hypodensity at the left  cerebellar peduncle is unchanged. Ventricles are similar in size and morphology. Bilateral basal ganglial calcifications. Vascular: No hyperdense vessels.  Carotid artery calcifications. Skull: Mastoid air cells demonstrate minimal fluid on the left. No skull fracture. Sinuses/Orbits: Mucosal thickening or retention cyst in the left maxillary sinus. No acute orbital abnormality. Other: None IMPRESSION: Stable CT appearance of the brain, no acute interval findings. Electronically Signed   By: Donavan Foil M.D.   On: 01/01/2016 18:48    I have reviewed the patient'[name] current medications.  Assessment/Plan: Problem #1 sepsis: Presently patient is afebrile . Remains on antibiotics Problem #2 end-stage renal disease: Status post hemodialysis Friday. His potassium is normal Problem #3 gastroenteritis:  Problem #4 elevated LFTs: Within followed by GI. Possible ischemic.Improving Problem #5 anemia: Patient came from endoscopy. He has multiple distal esophageal and gastric erosion. His hemoglobin is low but stable Problem #6 hyperkalemia; Has corrected Problem #7. Metabolic bone disease: His calcium is range. His phosphorus is high 5,7 but improving possibly from poor po intake Plan: 1] patient doesn't require dialysis today           2] We'll check compressive metabolic panel and phosphorus in amd           3] we'll check his CBC in the morning.           4] We will make arrangement for dialysis in am     LOS: 4 days   [name] 01/02/2016,9:55 AM

## 2016-01-03 NOTE — Op Note (Signed)
[facility] Patient Name: [name] Procedure Date: 01/15/2016 8:10 AM MRN: [id number] Date of Birth: [birth year] Attending MD: Hildred Laser , MD CSN: [id number] Age: 70 Admit Type: Inpatient Procedure:                Upper GI endoscopy Indications:              Acute post hemorrhagic anemia Providers:                Hildred Laser, MD, Otis Peak B. Sharon Seller, RN, Rosina Lowenstein, RN Referring MD:             Margaree Mackintosh. Candiss Norse, MD Medicines:                Cetacaine spray, Midazolam 3 mg IV Complications:            No immediate complications. Estimated Blood Loss:     Estimated blood loss: none. Procedure:                Pre-Anesthesia Assessment:                           - Prior to the procedure, a History and Physical                            was performed, and patient medications and                            allergies were reviewed. The patient's tolerance of                            previous anesthesia was also reviewed. The risks                            and benefits of the procedure and the sedation                            options and risks were discussed with the patient.                            All questions were answered, and informed consent                            was obtained. Prior Anticoagulants: The patient                            last took aspirin 1 day prior to the procedure. ASA                            Grade Assessment: III - A patient with severe                            systemic disease. After reviewing the risks and  benefits, the patient was deemed in satisfactory                            condition to undergo the procedure.                           After obtaining informed consent, the endoscope was                            passed under direct vision. Throughout the                            procedure, the patient's blood pressure, pulse, and                            oxygen  saturations were monitored continuously. The                            [id number] scope was introduced through the                            mouth, and advanced to the second part of duodenum.                            The upper GI endoscopy was accomplished without                            difficulty. The patient tolerated the procedure                            well. Scope In: 9:19:25 AM Scope Out: 9:29:09 AM Total Procedure Duration: 0 hours 9 minutes 44 seconds  Findings:      The upper third of the esophagus and middle third of the esophagus were       normal.      Few cratered esophageal ulcers with no bleeding and no stigmata of       recent bleeding were found. The largest lesion was four mm by ten mm in       largest dimension. Cells for cytology were obtained by brushing.      The Z-line was regular and was found 40 cm from the incisors.      A 3 cm hiatal hernia was present.      Numerous, non-bleeding erosions were found in the gastric body and in       the gastric antrum. There were no stigmata of recent bleeding.      Six non-bleeding cratered gastric ulcers with no stigmata of bleeding       were found in the gastric body and in the gastric antrum. The largest       lesion was three mm by six mm in largest dimension.      The cardia and pylorus were normal.      Two non-bleeding superficial duodenal ulcers with no stigmata of       bleeding were found in the duodenal bulb. The largest lesion was five mm       by seven mm in largest dimension.  Few non-bleeding superficial duodenal ulcers with no stigmata of       bleeding were found in the second portion of the duodenum. Impression:               - Normal upper third of esophagus and middle third                            of esophagus.                           - Non-bleeding esophageal ulcers. Cells for                            cytology obtained.                           - Z-line regular, 40 cm  from the incisors.                           - 3 cm hiatal hernia.                           - Non-bleeding erosive gastropathy.                           - Non-bleeding gastric ulcers with no stigmata of                            bleeding.                           - Normal cardia and pylorus.                           - Multiple non-bleeding duodenal ulcers with no                            stigmata of bleeding.                           - Multiple non-bleeding duodenal ulcers with no                            stigmata of bleeding.                           Comment: these changes are consistent with ishemic                            injury.                           Consider CT angiogram abdomen and repeat ECHO. Moderate Sedation:      Moderate (conscious) sedation was administered by the endoscopy nurse       and supervised by the endoscopist. The following parameters were       monitored: oxygen saturation, heart rate, blood pressure, CO2       capnography  and response to care. Total physician intraservice time was       14 minutes. Recommendation:           - Return patient to ICU for ongoing care.                           - Full liquid diet today.                           - Continue present medications.                           - No aspirin, ibuprofen, naproxen, or other                            non-steroidal anti-inflammatory drugs for 7 days.                           - Continue IV pantoprazole for now.                           - H. pylori serology. Procedure Code(s):        --- Professional ---                           [phone], Esophagogastroduodenoscopy, flexible,                            transoral; diagnostic, including collection of                            specimen(s) by brushing or washing, when performed                            (separate procedure)                           99152, Moderate sedation services provided by the                            same  physician or other qualified health care                            professional performing the diagnostic or                            therapeutic service that the sedation supports,                            requiring the presence of an independent trained                            observer to assist in the monitoring of the                            patient's level of consciousness and physiological  status; initial 15 minutes of intraservice time,                            patient age 20 years or older Diagnosis Code(s):        --- Professional ---                           K22.10, Ulcer of esophagus without bleeding                           K44.9, Diaphragmatic hernia without obstruction or                            gangrene                           K31.89, Other diseases of stomach and duodenum                           K25.9, Gastric ulcer, unspecified as acute or                            chronic, without hemorrhage or perforation                           K26.9, Duodenal ulcer, unspecified as acute or                            chronic, without hemorrhage or perforation                           D62, Acute posthemorrhagic anemia CPT copyright 2016 [employer]. All rights reserved. The codes documented in this report are preliminary and upon coder review may  be revised to meet current compliance requirements. Hildred Laser, MD Hildred Laser, MD 01/11/2016 9:57:25 AM This report has been signed electronically. Number of Addenda: 0

## 2016-01-03 NOTE — Progress Notes (Addendum)
PROGRESS NOTE                                                                                                                                                                                                             Patient Demographics:    [name], is a 70 y.o. male, DOB - 08-19-1945, [id number]  Admit date - 12/24/2015   Admitting Physician Thurnell Lose, MD  Outpatient Primary MD for the patient is Odette Fraction, MD  LOS - 4  Chief Complaint  Patient presents with  . Hypoglycemia       Brief Narrative    [name]  is a 70 y.o. male, with H/O Nonischemic cardiomyopathy with Chronic combined systolic and diastolic CHF  EF [id number] in 0000000 Has AICD, ESRD MWF schedule, Dyslipidemia, hypertension, gout, BPH, ongoing smoking abuse, sleep apnea and does not wear CPAP, who has had cholecystectomy comes to the hospital with 2 day history of diarrhea, generalized abdominal ache, continued to feel poorly and came to the ER where his initial workup suggested dehydration, sepsis/shock with elevated lactic acid levels of 15, hypoglycemia and Shock liver. CT scan of the abdomen was nonspecific with some pneumobilia consistent with previous sphincterotomy. GI was consulted and I was requested to admit.  Since his admission renal and GI are following, he is due to undergo EGD for possible upper GI bleed on 12/30/2015, his liver function is improving. He is overall showing signs of gradual clinical improvement.      Subjective:    [name] today has, No headache, No chest pain, mild abdominal pain - No Nausea, No new weakness tingling or numbness, No Cough - SOB.  He is sleepy this morning.   Assessment  & Plan :     1. Sepsis - with severe hypotension and hypoperfusion-induced shock liver -  chances are that he could have simply been hypotensive due to viral gastroenteritis diarrhea induced dehydration causing  hypotension and shock liver.   Ac.Hepatitis panel -ve, Vascular US Nonacute, GI Following, he did have significant leukocytosis with no clear source except questionable UA sample in a dialysis patient, with hydration and supportive care his numbers have improved, liver function is improving, leukocytosis improving, he remained afebrile throughout, continues to be on Zosyn which we will stop in the next 1-2 days. GI following.  Later in this admission he had evidence of some upper GI blood loss for which she is undergoing EGD on 01/09/2016.    2. Anemia of chronic disease along with likely occult GI blood loss related anemia. He is now Hemoccult-positive, hemoglobin has dropped about 4 g since admission, he received 1 unit of PRBC transfused on 01/02/2016, he is on IV PPI drip currently, GI following, we're monitoring H&H and is due to undergo EGD on 01/17/2016.   3. Chronic combined systolic and diastolic CHF EF [id number] with AICD history of nonischemic myopathy. Stable now.  4. Hypoglycemia due to combination of #1 above, diarrhea & poor oral intake. Sugars are labile but he tends to get hypoglycemic very easily, continuing him on gentle D5W drip until oral intake becomes more consistent. Stable TSH & random cortisol.  Lab Results  Component Value Date   HGBA1C 5.8 (H) 12/31/2015   CBG (last 3)   Recent Labs  12/18/2015 0150 12/29/2015 0415 12/20/2015 0758  GLUCAP 238* 236* 110*   Results for [name], [name] (MRN [id number]) as of 01/04/2016 09:17  Ref. Range 01/01/2016 19:04  Cortisol, Plasma Latest Units: ug/dL 53.5  TSH Latest Ref Range: 0.350 - 4.500 uIU/mL 1.763    5.ESRD. On MWF schedule, renal following.  6. COPD. Currently stable at baseline. Supportive care only.  7. 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 discussed with cardiology, stable EKG no acute workup. Troponin trend flat and non-ACS pattern. No chest pain. No acute ST changes on EKG.  8. Gout. Stable. Resume allopurinol once he is  clinically better.  9. ? UTI - UA obtained in the ESRD patient, specimen likely dirty and probably colonized, for now Zosyn to continue. Follow cultures. We'll likely stop antibiotics by 01/05/2016 if he remains clinically stable.  10. Delirium. Avoid benzodiazepines, Haldol as needed. CT head and Ammonia stable.  11. Tobacco abuse. Counseled to quit.  12. Thrombocytopenia. Likely due to acute illness and shock liver, will monitor.    Addendum - was called at 6pm that patient was some tenderness to touch in both legs, per wife feet always cold and they still are, bilat weak pulses, note EGD today has shown multiple ischemic ulcers with recent UGI bleed, at this time he is not a candidate for anticoagulation, CTA legs as ESRD already for prognosis, wife talked to understands the plan and agress, explained he might get Bilat AKA.   Family Communication  :  wife  Code Status :  DNR  Diet : Diet NPO time specified   Disposition Plan  :  Stepdown continue  Consults  :  GI, Renal  Procedures  :    CT - small pneumobilia, past Cholecystectomy with sphincterotomy, unspecific fluid collection in the abdomen  Vascular ultrasound of the abdomen - non acute  CT Head - non acute  EGD due 12/24/2015   DVT Prophylaxis  :    SCD   Lab Results  Component Value Date   PLT 82 (L) 01/17/2016    Inpatient Medications  Scheduled Meds: . meperidine      . midazolam      . [MAR Hold] calcium acetate  667 mg Oral Q lunch  . [MAR Hold] epoetin (EPOGEN/PROCRIT) injection  4,000 Units Intravenous Q M,W,F-HD  . [MAR Hold] feeding supplement (NEPRO CARB STEADY)  237 mL Oral Q24H  . [MAR Hold] lidocaine-prilocaine   Topical Once  . [MAR Hold] mouth rinse  15 mL Mouth Rinse BID  . [MAR Hold] metoprolol succinate  50 mg Oral Daily  . [MAR Hold] nicotine  21 mg Transdermal Daily  . [MAR Hold] pantoprazole  40 mg Intravenous Q12H  . [MAR Hold] piperacillin-tazobactam (ZOSYN)  IV  3.375 g  Intravenous Q12H   Continuous Infusions: . sodium chloride    . dextrose 30 mL/hr at 01/02/2016 0600  . pantoprozole (PROTONIX) infusion 8 mg/hr (12/18/2015 0600)   PRN Meds:.[MAR Hold] sodium chloride, [MAR Hold] alteplase, [MAR Hold] dextrose, [MAR Hold] diphenhydrAMINE, [MAR Hold] haloperidol lactate, [MAR Hold] heparin, [MAR Hold] HYDROcodone-acetaminophen, [MAR Hold] lidocaine (PF), [MAR Hold] lidocaine-prilocaine, [DISCONTINUED] ondansetron **OR** [MAR Hold] ondansetron (ZOFRAN) IV, [MAR Hold] pentafluoroprop-tetrafluoroeth  Antibiotics  :    Anti-infectives    Start     Dose/Rate Route Frequency Ordered Stop   12/27/2015 2200  [MAR Hold]  piperacillin-tazobactam (ZOSYN) IVPB 3.375 g     (MAR Hold since 01/10/2016 0843)   3.375 g 12.5 mL/hr over 240 Minutes Intravenous Every 12 hours 12/18/2015 1043     01/09/2016 1800  vancomycin (VANCOCIN) 50 mg/mL oral solution 500 mg  Status:  Discontinued    Comments:  Stool study pending pretty   500 mg Oral Every 6 hours 01/09/2016 1515 12/26/2015 1654   01/09/2016 1045  piperacillin-tazobactam (ZOSYN) IVPB 3.375 g     3.375 g 100 mL/hr over 30 Minutes Intravenous STAT 12/18/2015 1042 01/05/2016 1155   12/22/2015 1045  vancomycin (VANCOCIN) IVPB 1000 mg/200 mL premix     1,000 mg 200 mL/hr over 60 Minutes Intravenous  Once 12/23/2015 1042 01/07/2016 1231         Objective:   Vitals:   01/01/2016 0855 01/15/2016 0900 01/02/2016 0905 12/30/2015 0910  BP: (!) 140/47 (!) 139/49 (!) 142/49 (!) 141/48  Pulse: 82 82 82 83  Resp: 15 16 15 17   Temp:      TempSrc:      SpO2: 100% 100% 100% 100%  Weight:      Height:        Wt Readings from Last 3 Encounters:  01/08/2016 84.1 kg (185 lb 6.5 oz)  12/29/15 82.6 kg (182 lb)  12/27/15 84.4 kg (186 lb)     Intake/Output Summary (Last 24 hours) at 01/12/2016 0916 Last data filed at 01/14/2016 0800  Gross per 24 hour  Intake          1695.83 ml  Output                0 ml  Net          1695.83 ml     Physical  Exam  less sleepy this morning, No new F.N deficits, Normal affect Harvest.AT,PERRAL Supple Neck,No JVD, No cervical lymphadenopathy appriciated.  Symmetrical Chest wall movement, Good air movement bilaterally, CTAB RRR,No Gallops,Rubs or new Murmurs, No Parasternal Heave +ve B.Sounds, Abd Soft, mild epigastric tenderness, No organomegaly appriciated, No rebound - guarding or rigidity. No Cyanosis, Clubbing or edema, No new Rash or bruise       Data Review:    CBC  Recent Labs Lab 12/27/15 1012  01/02/2016 1013 01/01/2016 1704 12/31/15 0453 01/01/16 0401 01/01/16 1904 01/02/16 0058 01/02/16 0441 01/02/16 1446 01/03/16 0410  WBC 12.1*  --  22.5* 24.3* 24.9* 26.3*  --   --  18.3*  --  16.9*  HGB 10.3*  < > 10.1* 8.9* 9.6* 9.0* 9.3* 8.9* 7.5* 9.7* 9.4*  HCT 31.2*  < > 31.1* 27.9* 29.6* 28.3* 27.0* 26.1* 22.7* 28.1* 27.4*  PLT 199  --  223 169  156 174  --   --  96*  --  82*  MCV 94.0  --  97.8 96.2 93.1 95.6  --   --  92.7  --  89.5  MCH 31.0  --  31.8 30.7 30.2 30.4  --   --  30.6  --  30.7  MCHC 33.0  --  32.5 31.9 32.4 31.8  --   --  33.0  --  34.3  RDW 17.4*  --  17.7* 17.6* 17.0* 17.4*  --   --  18.0*  --  19.9*  LYMPHSABS 1.0  --  0.4*  --   --   --   --   --   --   --   --   MONOABS 0.8  --  1.5*  --   --   --   --   --   --   --   --   EOSABS 0.2  --  0.0  --   --   --   --   --   --   --   --   BASOSABS 0.0  --  0.0  --   --   --   --   --   --   --   --   < > = values in this interval not displayed.  Chemistries   Recent Labs Lab 12/31/15 0453 01/01/16 0401 01/01/16 0855 01/02/16 0441 12/28/2015 0410  NA 134* 134* 132* 126* 135  K 4.6 6.1* 6.3* 3.8 3.9  CL 94* 92* 91* 90* 97*  CO2 22 13* 12* 23 25  GLUCOSE 269* 115* 147* 479* 239*  BUN 49* 78* 84* 55* 74*  CREATININE 6.06* 8.34* 8.80* 5.59* 7.63*  CALCIUM 8.9 10.0 9.8 8.2* 8.6*  MG  --   --   --  2.0  --   AST 4,590* 1,237* 1,185* 819* 377*  ALT 2,934* 2,047* 2,002* 1,461* 1,124*  ALKPHOS 123 121 112 94 109   BILITOT 0.8 1.3* 1.4* 1.6* 1.5*   ------------------------------------------------------------------------------------------------------------------ No results for input(s): CHOL, HDL, LDLCALC, TRIG, CHOLHDL, LDLDIRECT in the last 72 hours.  Lab Results  Component Value Date   HGBA1C 5.8 (H) 12/31/2015   ------------------------------------------------------------------------------------------------------------------  Recent Labs  01/01/16 1904  TSH 1.763   ------------------------------------------------------------------------------------------------------------------ No results for input(s): VITAMINB12, FOLATE, FERRITIN, TIBC, IRON, RETICCTPCT in the last 72 hours.  Coagulation profile  Recent Labs Lab 12/28/2015 1704 12/31/15 1152 01/01/16 0401 01/02/16 0441  INR 2.40 2.14 2.47 2.28    No results for input(s): DDIMER in the last 72 hours.  Cardiac Enzymes  Recent Labs Lab 12/27/15 1012 12/27/15 1327  TROPONINI 0.19* 0.18*   ------------------------------------------------------------------------------------------------------------------    Component Value Date/Time   BNP >4,500.0 (H) 12/27/2015 1012   BNP 1,583.3 (H) 07/12/2011 1823    Micro Results Recent Results (from the past 240 hour(s))  Blood Culture (routine x 2)     Status: None (Preliminary result)   Collection Time: 01/17/2016 10:13 AM  Result Value Ref Range Status   Specimen Description BLOOD LEFT ARM  Final   Special Requests BOTTLES DRAWN AEROBIC AND ANAEROBIC 5 CC EACH  Final   Culture NO GROWTH 3 DAYS  Final   Report Status PENDING  Incomplete  Blood Culture (routine x 2)     Status: None (Preliminary result)   Collection Time: 12/27/2015 10:13 AM  Result Value Ref Range Status   Specimen Description BLOOD LEFT ARM  Final   Special Requests BOTTLES  DRAWN AEROBIC AND ANAEROBIC 7 CC EACH  Final   Culture NO GROWTH 3 DAYS  Final   Report Status PENDING  Incomplete  C difficile quick scan w  PCR reflex     Status: None   Collection Time: 12/22/2015  1:56 PM  Result Value Ref Range Status   C Diff antigen NEGATIVE NEGATIVE Final   C Diff toxin NEGATIVE NEGATIVE Final   C Diff interpretation No C. difficile detected.  Final  MRSA PCR Screening     Status: None   Collection Time: 01/12/2016  2:21 PM  Result Value Ref Range Status   MRSA by PCR NEGATIVE NEGATIVE Final    Comment:        The GeneXpert MRSA Assay (FDA approved for NASAL specimens only), is one component of a comprehensive MRSA colonization surveillance program. It is not intended to diagnose MRSA infection nor to guide or monitor treatment for MRSA infections.   Gastrointestinal Panel by PCR , Stool     Status: None   Collection Time: 12/31/15  8:22 AM  Result Value Ref Range Status   Campylobacter species NOT DETECTED NOT DETECTED Final   Plesimonas shigelloides NOT DETECTED NOT DETECTED Final   Salmonella species NOT DETECTED NOT DETECTED Final   Yersinia enterocolitica NOT DETECTED NOT DETECTED Final   Vibrio species NOT DETECTED NOT DETECTED Final   Vibrio cholerae NOT DETECTED NOT DETECTED Final   Enteroaggregative E coli (EAEC) NOT DETECTED NOT DETECTED Final   Enteropathogenic E coli (EPEC) NOT DETECTED NOT DETECTED Final   Enterotoxigenic E coli (ETEC) NOT DETECTED NOT DETECTED Final   Shiga like toxin producing E coli (STEC) NOT DETECTED NOT DETECTED Final   Shigella/Enteroinvasive E coli (EIEC) NOT DETECTED NOT DETECTED Final   Cryptosporidium NOT DETECTED NOT DETECTED Final   Cyclospora cayetanensis NOT DETECTED NOT DETECTED Final   Entamoeba histolytica NOT DETECTED NOT DETECTED Final   Giardia lamblia NOT DETECTED NOT DETECTED Final   Adenovirus F40/41 NOT DETECTED NOT DETECTED Final   Astrovirus NOT DETECTED NOT DETECTED Final   Norovirus GI/GII NOT DETECTED NOT DETECTED Final   Rotavirus A NOT DETECTED NOT DETECTED Final   Sapovirus (I, II, IV, and V) NOT DETECTED NOT DETECTED Final     Radiology Reports Ct Abdomen Pelvis Wo Contrast  Result Date: 01/15/2016 CLINICAL DATA:  They were called out for unconscious. Upon arrival they took patients blood sugar and it was 23; sepsis; elevated liver enzymes; ams EXAM: CT ABDOMEN AND PELVIS WITHOUT CONTRAST TECHNIQUE: Multidetector CT imaging of the abdomen and pelvis was performed following the standard protocol without IV contrast. COMPARISON:  CT 01/03/2015 FINDINGS: Lower chest: Lung bases are clear. Hepatobiliary: 3.6 cm cyst in the LEFT lateral hepatic lobe. Pneumobilia in common bile duct and LEFT hepatic lobe. Interval cholecystectomy. No biliary dilatation. Pancreas: Pancreas is normal. No ductal dilatation. No pancreatic inflammation. Spleen: Normal spleen Adrenals/urinary tract: Adrenal glands are mildly thickened. There are several simple fluid attenuation lesions associated with the kidneys. No ureterolithiasis or obstructive uropathy. Foley catheter within collapsed bladder. Stomach/Bowel: Stomach is distended by fluid. No obstructing lesions identified. The small bowel and cecum are normal. The appendix is normal. The colon and rectosigmoid colon are normal. Vascular/Lymphatic: Abdominal aorta is normal caliber with atherosclerotic calcification. There is no retroperitoneal or periportal lymphadenopathy. No pelvic lymphadenopathy. Reproductive: Prostate normal Other: No free fluid. Musculoskeletal: Round well-circumscribed lesion in the LEFT femoral neck has a thin sclerotic rim appears benign. No change from comparison examNo  aggressive osseous lesion. IMPRESSION: 1. Large volume of fluid within the stomach suggests gastroparesis 2. Pneumobilia is new from comparison exam. Interval cholecystectomy and presumably a sphincterotomy. 3.  Atherosclerotic calcification of the aorta. 4. Foley catheter in bladder. 5. Stable benign-appearing sclerotic lesion in the LEFT femur. Electronically Signed   By: Suzy Bouchard M.D.   On:  01/16/2016 12:10   Dg Chest 2 View  Result Date: 12/27/2015 CLINICAL DATA:  Cough and shortness of breath. EXAM: CHEST  2 VIEW COMPARISON:  April 02, 2015 FINDINGS: Stable cardiomegaly. The hila and mediastinum are unchanged. No nodules or masses. No focal infiltrates or overt edema. IMPRESSION: No active cardiopulmonary disease. Electronically Signed   By: Dorise Bullion III M.D   On: 12/27/2015 10:17   Ct Head Wo Contrast  Result Date: 01/01/2016 CLINICAL DATA:  Disoriented EXAM: CT HEAD WITHOUT CONTRAST TECHNIQUE: Contiguous axial images were obtained from the base of the skull through the vertex without intravenous contrast. COMPARISON:  01/15/2016 FINDINGS: Brain: No acute territorial infarction, intracranial hemorrhage or extra-axial fluid collection. No focal mass, mass effect or midline shift. Mild atrophy. Mild periventricular white matter hypodensity consistent with small vessel disease. Smudgy hypodensities within the pons could also relate to small vessel change. Mild hypodensity at the left cerebellar peduncle is unchanged. Ventricles are similar in size and morphology. Bilateral basal ganglial calcifications. Vascular: No hyperdense vessels.  Carotid artery calcifications. Skull: Mastoid air cells demonstrate minimal fluid on the left. No skull fracture. Sinuses/Orbits: Mucosal thickening or retention cyst in the left maxillary sinus. No acute orbital abnormality. Other: None IMPRESSION: Stable CT appearance of the brain, no acute interval findings. Electronically Signed   By: Donavan Foil M.D.   On: 01/01/2016 18:48   Ct Head Wo Contrast  Result Date: 12/19/2015 CLINICAL DATA:  Altered mental status.  Hypoglycemia. EXAM: CT HEAD WITHOUT CONTRAST TECHNIQUE: Contiguous axial images were obtained from the base of the skull through the vertex without intravenous contrast. COMPARISON:  None. FINDINGS: Brain: There is mild to moderate diffuse atrophy. There is no intracranial mass,  hemorrhage, extra-axial fluid collection, or midline shift. There is patchy small vessel disease in the centra semiovale bilaterally, primarily posteriorly and superiorly. Elsewhere gray-white compartments appear unremarkable. No acute infarct is evident. There is calcification in each basal ganglia region, a physiologic finding in this age group. Vascular: There is no hyperdense vessel. There are foci of calcification in each carotid siphon region. Skull: The bony calvarium appears intact. Sinuses/Orbits: There is a retention cyst in the left maxillary antrum. There is mild mucosal thickening in several ethmoid air cells bilaterally. Other visualized paranasal sinuses are clear. Orbits appear symmetric bilaterally. Other: There is opacification of several inferior mastoid air cells on the left. Visualized mastoid air cells elsewhere appear clear. There is mild debris in each external auditory canal. IMPRESSION: Atrophy with patchy periventricular small vessel disease. No intracranial mass, hemorrhage, or extra-axial fluid collection. No acute infarct evident. Basal ganglia calcification bilaterally is physiologic in this age group. Foci of arterial vascular calcification noted. Mild left-sided mastoid air cell disease. Probable cerumen in each external auditory canal. Foci of paranasal sinus disease at several sites. Electronically Signed   By: Lowella Grip III M.D.   On: 12/24/2015 11:00   [place] Art/ven Flow Abd Pelv Doppler  Result Date: 12/31/2015 CLINICAL DATA:  On conscious. Sepsis. Elevated liver function tests. EXAM: DUPLEX ULTRASOUND OF LIVER TECHNIQUE: Color and duplex Doppler ultrasound was performed to evaluate the hepatic in-flow and out-flow  vessels. COMPARISON:  None. FINDINGS: Portal Vein Velocities Main:  38 cm/sec Right:  40 cm/sec Left:  13 cm/sec Hepatic Vein Velocities Right:  55 cm/sec Middle:  75 cm/sec Left:  65 cm/sec Hepatic Artery Velocity:  210 cm/sec Splenic Vein Velocity: The  patient refused imaging of the splenic vein cm/sec Varices: Absent Ascites: Absent Portal veins are hepatopetal and directionality of flow. Hepatic veins are hepatofugal. IMPRESSION: Hepatic and portal veins are all patent with normal directionality of flow. The patient refused imaging of the splenic vein. Electronically Signed   By: Marybelle Killings M.D.   On: 12/31/2015 14:42   Dg Chest Port 1 View  Result Date: 01/01/2016 CLINICAL DATA:  Shortness of breath, CHF EXAM: PORTABLE CHEST 1 VIEW COMPARISON:  12/21/2015 FINDINGS: Right jugular central venous catheter with the tip projecting over the SVC. There is no focal parenchymal opacity. There is no pleural effusion or pneumothorax. There is mild stable cardiomegaly. There is a single lead cardiac pacemaker. The osseous structures are unremarkable. IMPRESSION: No active disease. Stable cardiomegaly. Electronically Signed   By: Kathreen Devoid   On: 01/01/2016 09:09   Dg Chest Port 1 View  Result Date: 12/21/2015 CLINICAL DATA:  Sepsis EXAM: PORTABLE CHEST 1 VIEW COMPARISON:  12/27/2015 FINDINGS: Cardiac shadow is enlarged. A defibrillator is again noted and stable. The lungs are well aerated bilaterally without focal infiltrate or sizable effusion. No acute bony abnormality is seen. IMPRESSION: No active disease. Electronically Signed   By: Inez Catalina M.D.   On: 01/10/2016 10:42   US Abdomen Limited Ruq  Result Date: 12/31/2015 CLINICAL DATA:  Elevated liver function tests EXAM: US ABDOMEN LIMITED - RIGHT UPPER QUADRANT COMPARISON:  None. FINDINGS: Gallbladder: Absent. Common bile duct: Diameter: 2.6 mm. Liver: Simple cyst in the left lobe measures 3.8 cm. No solid mass. Liver echotexture is heterogeneous. IMPRESSION: Gallbladder is absent compatible with cholecystectomy. Simple cyst in the liver. No solid mass. Liver echotexture is heterogeneous suggesting diffuse hepatic parenchymal disease. Electronically Signed   By: Marybelle Killings M.D.   On: 12/31/2015  14:44    Time Spent in minutes  30   Felise Georgia K M.D on 01/04/2016 at 9:16 AM  Between 7am to 7pm - Pager - [phone]  After 7pm go to [email or web address] - password [facility]  [facility] -  Office  [phone]

## 2016-01-03 NOTE — Progress Notes (Signed)
EGD findings: Multiple ulcers in involving distal 4 cm of esophageal mucosa. Brushing taken for KOH prep. 3 cm size sliding hiatal hernia. Numerous erosions at gastric body and antrum along with multiple small gastric ulcers. Bulbar and post bulbar ulcers. Biopsies not taken because of coagulopathy.  Above findings are consistent with ischemic injury. Consider CT angiogram abdomen and ECHO.

## 2016-01-03 NOTE — Progress Notes (Signed)
Removed SCDs at 14:00 upon removal pt's bilateral lower legs noted to be reddened from where SCDs were.   17:30 Pt's wife reports pt c/o cramping in bilateral legs. Upon assessment pt's feet cool, +DP pulses bilaterally, painful to touch. MD Candiss Norse notified.   18:00 MD Memon on unit to evaluate patient BLE.   18:50 Patient taken to CT per MD Candiss Norse via patient's bed escorted by Junie Panning, RN and CT tech.   19:20 Pt returned from CT   Report given to Josph Macho, [place]

## 2016-01-04 ENCOUNTER — Inpatient Hospital Stay (HOSPITAL_COMMUNITY): Payer: Medicare Other

## 2016-01-04 DIAGNOSIS — I509 Heart failure, unspecified: Secondary | ICD-10-CM

## 2016-01-04 DIAGNOSIS — B3781 Candidal esophagitis: Secondary | ICD-10-CM

## 2016-01-04 LAB — GLUCOSE, CAPILLARY
GLUCOSE-CAPILLARY: 251 mg/dL — AB (ref 65–99)
Glucose-Capillary: 243 mg/dL — ABNORMAL HIGH (ref 65–99)
Glucose-Capillary: 255 mg/dL — ABNORMAL HIGH (ref 65–99)
Glucose-Capillary: 264 mg/dL — ABNORMAL HIGH (ref 65–99)
Glucose-Capillary: 292 mg/dL — ABNORMAL HIGH (ref 65–99)
Glucose-Capillary: 294 mg/dL — ABNORMAL HIGH (ref 65–99)

## 2016-01-04 LAB — ECHOCARDIOGRAM COMPLETE
AV Area VTI: 1.43 cm2
AV Mean grad: 15 mmHg
AV VEL mean LVOT/AV: 0.56
AV area mean vel ind: 0.69 cm2/m2
AVA: 1.33 cm2
AVAREAMEANV: 1.42 cm2
AVAREAVTIIND: 0.65 cm2/m2
AVLVOTPG: 10 mmHg
AVPG: 31 mmHg
AVPKVEL: 280 cm/s
Ao pk vel: 0.56 m/s
CHL CUP AV PEAK INDEX: 0.7
CHL CUP AV VALUE AREA INDEX: 0.65
CHL CUP AV VEL: 1.33
CHL CUP DOP CALC LVOT VTI: 27.3 cm
DOP CAL AO MEAN VELOCITY: 172 cm/s
E decel time: 158 msec
EERAT: 19.44
FS: 16 % — AB (ref 28–44)
HEIGHTINCHES: 69 in
IV/PV OW: 0.95
LA diam end sys: 41 mm
LA diam index: 2.01 cm/m2
LA vol A4C: 127 ml
LASIZE: 41 mm
LAVOL: 138 mL
LAVOLIN: 67.6 mL/m2
LDCA: 2.54 cm2
LV E/e' medial: 19.44
LV E/e'average: 19.44
LV TDI E'MEDIAL: 3.19
LV dias vol: 357 mL — AB (ref 62–150)
LV sys vol index: 128 mL/m2
LV sys vol: 261 mL — AB (ref 21–61)
LVDIAVOLIN: 175 mL/m2
LVELAT: 8.18 cm/s
LVOT SV: 69 mL
LVOT peak VTI: 0.53 cm
LVOT peak vel: 158 cm/s
LVOTD: 18 mm
MV Dec: 158
MV VTI: 168 cm
MV pk A vel: 106 m/s
MV pk E vel: 159 m/s
MVPG: 10 mmHg
P 1/2 time: 190 ms
PISA EROA: 0.06 cm2
PW: 16.5 mm — AB (ref 0.6–1.1)
Simpson's disk: 27
Stroke v: 96 ml
TAPSE: 22 mm
TDI e' lateral: 8.18
VTI: 52 cm
WEIGHTICAEL: 2970.04 [oz_av]

## 2016-01-04 LAB — CBC
HCT: 26.3 % — ABNORMAL LOW (ref 39.0–52.0)
Hemoglobin: 9 g/dL — ABNORMAL LOW (ref 13.0–17.0)
MCH: 30.8 pg (ref 26.0–34.0)
MCHC: 34.2 g/dL (ref 30.0–36.0)
MCV: 90.1 fL (ref 78.0–100.0)
PLATELETS: 83 10*3/uL — AB (ref 150–400)
RBC: 2.92 MIL/uL — ABNORMAL LOW (ref 4.22–5.81)
RDW: 20.3 % — ABNORMAL HIGH (ref 11.5–15.5)
WBC: 15 10*3/uL — AB (ref 4.0–10.5)

## 2016-01-04 LAB — COMPREHENSIVE METABOLIC PANEL
ALT: 806 U/L — AB (ref 17–63)
AST: 212 U/L — ABNORMAL HIGH (ref 15–41)
Albumin: 2 g/dL — ABNORMAL LOW (ref 3.5–5.0)
Alkaline Phosphatase: 97 U/L (ref 38–126)
Anion gap: 15 (ref 5–15)
BUN: 88 mg/dL — ABNORMAL HIGH (ref 6–20)
CALCIUM: 8 mg/dL — AB (ref 8.9–10.3)
CHLORIDE: 92 mmol/L — AB (ref 101–111)
CO2: 22 mmol/L (ref 22–32)
CREATININE: 8.91 mg/dL — AB (ref 0.61–1.24)
GFR, EST AFRICAN AMERICAN: 6 mL/min — AB (ref >60)
GFR, EST NON AFRICAN AMERICAN: 5 mL/min — AB (ref >60)
Glucose, Bld: 255 mg/dL — ABNORMAL HIGH (ref 65–99)
Potassium: 4.5 mmol/L (ref 3.5–5.1)
Sodium: 129 mmol/L — ABNORMAL LOW (ref 135–145)
TOTAL PROTEIN: 4.6 g/dL — AB (ref 6.5–8.1)
Total Bilirubin: 1.3 mg/dL — ABNORMAL HIGH (ref 0.3–1.2)

## 2016-01-04 LAB — CULTURE, BLOOD (ROUTINE X 2)
Culture: NO GROWTH
Culture: NO GROWTH

## 2016-01-04 LAB — FERRITIN: Ferritin: >7500 ng/mL — ABNORMAL HIGH (ref 24–336)

## 2016-01-04 LAB — PROTIME-INR
INR: 1.44
PROTHROMBIN TIME: 17.7 s — AB (ref 11.4–15.2)

## 2016-01-04 MED ORDER — FLUCONAZOLE 100 MG PO TABS
200.0000 mg | ORAL_TABLET | Freq: Once | ORAL | Status: AC
  Administered 2016-01-04: 200 mg via ORAL
  Filled 2016-01-04: qty 2

## 2016-01-04 MED ORDER — PANTOPRAZOLE SODIUM 40 MG PO TBEC
40.0000 mg | DELAYED_RELEASE_TABLET | Freq: Two times a day (BID) | ORAL | Status: DC
  Administered 2016-01-04 – 2016-01-05 (×2): 40 mg via ORAL
  Filled 2016-01-04 (×4): qty 1

## 2016-01-04 MED ORDER — HEPARIN SODIUM (PORCINE) 1000 UNIT/ML DIALYSIS: 1000.0000 [IU] | INTRAMUSCULAR | Status: DC | PRN

## 2016-01-04 MED ORDER — ASPIRIN 81 MG PO CHEW
81.0000 mg | CHEWABLE_TABLET | Freq: Every day | ORAL | Status: DC
  Administered 2016-01-04 – 2016-01-05 (×2): 81 mg via ORAL
  Filled 2016-01-04 (×2): qty 1

## 2016-01-04 MED ORDER — FLUCONAZOLE 100 MG PO TABS
100.0000 mg | ORAL_TABLET | Freq: Every day | ORAL | Status: DC
  Administered 2016-01-05: 100 mg via ORAL
  Filled 2016-01-04 (×2): qty 1

## 2016-01-04 MED ORDER — ALTEPLASE 2 MG IJ SOLR: 2.0000 mg | Freq: Once | INTRAMUSCULAR | Status: DC | PRN

## 2016-01-04 MED ORDER — LIDOCAINE HCL (PF) 1 % IJ SOLN: 5.0000 mL | INTRAMUSCULAR | Status: DC | PRN

## 2016-01-04 MED ORDER — SODIUM CHLORIDE 0.9 % IV SOLN: 100.0000 mL | INTRAVENOUS | Status: DC | PRN

## 2016-01-04 MED ORDER — EPOETIN ALFA 4000 UNIT/ML IJ SOLN
INTRAMUSCULAR | Status: AC
  Filled 2016-01-04: qty 1

## 2016-01-04 MED ORDER — INSULIN ASPART 100 UNIT/ML ~~LOC~~ SOLN
0.0000 [IU] | Freq: Three times a day (TID) | SUBCUTANEOUS | Status: DC
  Administered 2016-01-04 (×2): 5 [IU] via SUBCUTANEOUS
  Administered 2016-01-05: 3 [IU] via SUBCUTANEOUS
  Administered 2016-01-05: 5 [IU] via SUBCUTANEOUS
  Administered 2016-01-05: 2 [IU] via SUBCUTANEOUS

## 2016-01-04 NOTE — Progress Notes (Signed)
Subjective: Interval History: Patient feels better . He denies any nausea or vomiting. He also does not have any diarrhea.  Objective: Vital signs in last 24 hours: Temp:  [97.3 F (36.3 C)-98.1 F (36.7 C)] 98.1 F (36.7 C) (12/18 0400) Pulse Rate:  [76-90] 76 (12/18 0600) Resp:  [11-23] 17 (12/18 0600) BP: (121-155)/(37-67) 155/37 (12/18 0600) SpO2:  [96 %-100 %] 100 % (12/18 0600) Weight:  [84.2 kg (185 lb 10 oz)] 84.2 kg (185 lb 10 oz) (12/18 0600) Weight change: 0.1 kg (3.5 oz)  Intake/Output from previous day: 12/17 0701 - 12/18 0700 In: 1324.5 [P.O.:100; I.V.:1062; IV Piggyback:162.5] Out: -  Intake/Output this shift: No intake/output data recorded.  Patient : Patient is alert and in no apparent distress Chest: Chest is clear to auscultation Heart exam revealed regular rate and rhythm no murmur Extremities no edema  Lab Results:  Recent Labs  01/11/2016 0410 01/04/16 0422  WBC 16.9* 15.0*  HGB 9.4* 9.0*  HCT 27.4* 26.3*  PLT 82* 83*   BMET:   Recent Labs  01/05/2016 0410 01/04/16 0422  NA 135 129*  K 3.9 4.5  CL 97* 92*  CO2 25 22  GLUCOSE 239* 255*  BUN 74* 88*  CREATININE 7.63* 8.91*  CALCIUM 8.6* 8.0*   No results for input(s): PTH in the last 72 hours. Iron Studies: No results for input(s): IRON, TIBC, TRANSFERRIN, FERRITIN in the last 72 hours.  Studies/Results: Ct Angio Ao+bifem W &/or Wo Contrast  Result Date: 01/01/2016 CLINICAL DATA:  Bilateral leg pain extending to the feet and toes EXAM: CT ANGIOGRAPHY AOBIFEM WITHOUT AND WITH CONTRAST TECHNIQUE: Arterial phase postcontrast images of the abdominal aorta and both lower extremities were obtained. Multi planer reformatted images and maximum intensity projections were also provided. CONTRAST:  150 mL Isovue 370 IV COMPARISON:  None. FINDINGS: VASCULAR FINDINGS Aorta: There is atherosclerotic calcification within the abdominal aorta. No aneurysm, dissection or focal ulceration. Celiac axis: There  is atherosclerotic calcification at the origin without high-grade stenosis. No dissection. Normal celiac branching pattern. Superior mesenteric artery: Atherosclerotic calcification at the origin without high-grade stenosis or dissection. Renal arteries: Single right and duplicated left renal arteries. There is atherosclerotic calcification at the origin of the right renal artery. Inferior mesenteric artery:  Patent. Common iliac arteries: Patent without aneurysm or stenosis. Multifocal atherosclerotic calcification. Internal iliac arteries: Mild atherosclerotic calcification but otherwise normal. External iliac arteries: There is proximal atherosclerotic calcification within both external iliac arteries, without associated stenosis. The deep circumflex and inferior epigastric arteries are patent. Runoff: There is normal opacification of both femoral arteries and popliteal arteries, without dissection or high-grade stenosis. There is diminished contrast-enhancement within the anterior tibial, posterior tibial and fibular arteries bilaterally, beginning at the level of the proximal tibial diaphysis. On the right, there is near complete loss of contrast opacification of the anterior tibial artery at the level of the midtibia, while the fibular and posterior tibial arteries remain opacified. The appearance of the left anterior tibial artery is similar. NONVASCULAR FINDINGS Lower chest: No pulmonary nodules. No visible pleural or pericardial effusion. Hepatobiliary: Left hepatic lobe cyst measures 3 cm. Gallbladder is surgically absent. Pancreas: Normal pancreatic contours and enhancement. No peripancreatic fluid collection or pancreatic ductal dilatation. Spleen: Visualized spleen is normal. Adrenals/Urinary Tract: Normal adrenal glands. Bilateral atrophic kidneys with multiple renal cysts. The largest cysts measures 17 mm. Stomach/Bowel: No abnormal bowel dilatation. No bowel wall thickening or adjacent fat stranding  to indicate acute inflammation. No abdominal fluid collection.  Normal appendix. Lymphatic:  No abdominal or pelvic adenopathy. Reproductive: Prostate calcifications. Normal seminal vesicles. No free fluid in the pelvis. Musculoskeletal: No lytic or blastic osseous lesion. Normal visualized extrathoracic and extraperitoneal soft tissues. Other: No contributory non-categorized findings. Review of the MIP images confirms the above findings. IMPRESSION: 1. Diminished opacification of the bilateral anterior tibial, posterior tibial and fibular arteries. Given the symmetric appearance, the findings may be secondary to an effect of contrast bolus timing. However, a degree of stenosis or occlusion would be difficult to exclude, particularly of the anterior tibial arteries, which show decreased opacification at a more proximal level than the posterior tibial and fibular arteries. Lower extremity vascular ultrasound may be helpful for further evaluation. 2. No occlusion or high grade stenosis of the femoral or popliteal arteries. 3. Aortic atherosclerosis. No dissection or high-grade stenosis of the major abdominal and pelvic arteries. 4. Bilateral renal atrophy. Electronically Signed   By: Ulyses Jarred M.D.   On: 12/30/2015 20:44    I have reviewed the patient's current medications.  Assessment/Plan: Problem #1 sepsis: Patient remains afebrile. His white blood cell count is improving. Problem #2 end-stage renal disease: Patient presently does not have any uremic signs and symptoms. He was dialyzed on Friday. Patient is due for dialysis today Problem #3 gastroenteritis: Has recovered. Problem #4 elevated LFTs: Within followed by GI. Possible ischemic history or function test is progressively improving. Problem #5 anemia: Patient came from endoscopy. He has multiple distal esophageal and gastric erosion. His hemoglobin is low but stable. Problem #6 hyperkalemia; Has corrected Problem #7. Metabolic bone disease:  His calcium and phosphorus easy range. Plan: 1] we'll dialyze patient today           2] we'll try to remove about 2 L his systolic blood pressure remains above 90           3] we'll check his CBC in the morning.                LOS: 5 days   Demeshia Sherburne S 01/04/2016,7:17 AM

## 2016-01-04 NOTE — Progress Notes (Signed)
*  PRELIMINARY RESULTS* Echocardiogram 2D Echocardiogram has been performed.  Samuel Germany 01/04/2016, 11:43 AM

## 2016-01-04 NOTE — Progress Notes (Signed)
Subjective: No overt GI bleeding, no abdominal pain, N/V. Poor appetite. Denies dysphagia.   Objective: Vital signs in last 24 hours: Temp:  [97.3 F (36.3 C)-98.1 F (36.7 C)] 98.1 F (36.7 C) (12/18 0400) Pulse Rate:  [76-90] 76 (12/18 0600) Resp:  [11-23] 17 (12/18 0600) BP: (121-155)/(37-67) 155/37 (12/18 0600) SpO2:  [96 %-100 %] 100 % (12/18 0600) Weight:  [185 lb 10 oz (84.2 kg)] 185 lb 10 oz (84.2 kg) (12/18 0600) Last BM Date: 01/01/16 General:   Alert and oriented Head:  Normocephalic and atraumatic. Abdomen:  Bowel sounds present, soft, non-tender, non-distended. No HSM or hernias noted. Neurologic:  Alert and  oriented x4 Psych:  Alert and cooperative. Flat affect   Intake/Output from previous day: 12/17 0701 - 12/18 0700 In: 1324.5 [P.O.:100; I.V.:1062; IV Piggyback:162.5] Out: -  Intake/Output this shift: No intake/output data recorded.  Lab Results:  Recent Labs  01/02/16 0441 01/02/16 1446 01/02/2016 0410 01/04/16 0422  WBC 18.3*  --  16.9* 15.0*  HGB 7.5* 9.7* 9.4* 9.0*  HCT 22.7* 28.1* 27.4* 26.3*  PLT 96*  --  82* 83*   BMET  Recent Labs  01/02/16 0441 12/27/2015 0410 01/04/16 0422  NA 126* 135 129*  K 3.8 3.9 4.5  CL 90* 97* 92*  CO2 23 25 22   GLUCOSE 479* 239* 255*  BUN 55* 74* 88*  CREATININE 5.59* 7.63* 8.91*  CALCIUM 8.2* 8.6* 8.0*   LFT  Recent Labs  01/02/16 0441 01/15/2016 0410 01/04/16 0422  PROT 4.2* 4.4* 4.6*  ALBUMIN 1.9* 2.0* 2.0*  AST 819* 377* 212*  ALT 1,461* 1,124* 806*  ALKPHOS 94 109 97  BILITOT 1.6* 1.5* 1.3*   PT/INR  Recent Labs  01/02/16 0441 01/04/16 0422  LABPROT 25.5* 17.7*  INR 2.28 1.44     Studies/Results: Ct Angio Ao+bifem W &/or Wo Contrast  Result Date: 01/01/2016 CLINICAL DATA:  Bilateral leg pain extending to the feet and toes EXAM: CT ANGIOGRAPHY AOBIFEM WITHOUT AND WITH CONTRAST TECHNIQUE: Arterial phase postcontrast images of the abdominal aorta and both lower extremities were  obtained. Multi planer reformatted images and maximum intensity projections were also provided. CONTRAST:  150 mL Isovue 370 IV COMPARISON:  None. FINDINGS: VASCULAR FINDINGS Aorta: There is atherosclerotic calcification within the abdominal aorta. No aneurysm, dissection or focal ulceration. Celiac axis: There is atherosclerotic calcification at the origin without high-grade stenosis. No dissection. Normal celiac branching pattern. Superior mesenteric artery: Atherosclerotic calcification at the origin without high-grade stenosis or dissection. Renal arteries: Single right and duplicated left renal arteries. There is atherosclerotic calcification at the origin of the right renal artery. Inferior mesenteric artery:  Patent. Common iliac arteries: Patent without aneurysm or stenosis. Multifocal atherosclerotic calcification. Internal iliac arteries: Mild atherosclerotic calcification but otherwise normal. External iliac arteries: There is proximal atherosclerotic calcification within both external iliac arteries, without associated stenosis. The deep circumflex and inferior epigastric arteries are patent. Runoff: There is normal opacification of both femoral arteries and popliteal arteries, without dissection or high-grade stenosis. There is diminished contrast-enhancement within the anterior tibial, posterior tibial and fibular arteries bilaterally, beginning at the level of the proximal tibial diaphysis. On the right, there is near complete loss of contrast opacification of the anterior tibial artery at the level of the midtibia, while the fibular and posterior tibial arteries remain opacified. The appearance of the left anterior tibial artery is similar. NONVASCULAR FINDINGS Lower chest: No pulmonary nodules. No visible pleural or pericardial effusion. Hepatobiliary: Left hepatic lobe  cyst measures 3 cm. Gallbladder is surgically absent. Pancreas: Normal pancreatic contours and enhancement. No peripancreatic fluid  collection or pancreatic ductal dilatation. Spleen: Visualized spleen is normal. Adrenals/Urinary Tract: Normal adrenal glands. Bilateral atrophic kidneys with multiple renal cysts. The largest cysts measures 17 mm. Stomach/Bowel: No abnormal bowel dilatation. No bowel wall thickening or adjacent fat stranding to indicate acute inflammation. No abdominal fluid collection. Normal appendix. Lymphatic:  No abdominal or pelvic adenopathy. Reproductive: Prostate calcifications. Normal seminal vesicles. No free fluid in the pelvis. Musculoskeletal: No lytic or blastic osseous lesion. Normal visualized extrathoracic and extraperitoneal soft tissues. Other: No contributory non-categorized findings. Review of the MIP images confirms the above findings. IMPRESSION: 1. Diminished opacification of the bilateral anterior tibial, posterior tibial and fibular arteries. Given the symmetric appearance, the findings may be secondary to an effect of contrast bolus timing. However, a degree of stenosis or occlusion would be difficult to exclude, particularly of the anterior tibial arteries, which show decreased opacification at a more proximal level than the posterior tibial and fibular arteries. Lower extremity vascular ultrasound may be helpful for further evaluation. 2. No occlusion or high grade stenosis of the femoral or popliteal arteries. 3. Aortic atherosclerosis. No dissection or high-grade stenosis of the major abdominal and pelvic arteries. 4. Bilateral renal atrophy. Electronically Signed   By: Ulyses Jarred M.D.   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 EGD 12/17 with non-bleeding esophageal ulcers, non-bleeding gastric ulcers,  multiple non-bleeding duodenal ulcers with concern raised for ischemic injury. CTA of aorta/bifem completed yesterday. Mesenteric vasculature with calcifications at SMA and celiac but without high-grade stenosis, IMA patent. H.pylori serology pending, Hgb 9 and overall stable without any overt GI bleeding. LFTs continuing to improve. KOH prep with yeast, and patient denies outright dysphagia. Poor overall appetite.     Plan: Protonix infusion complete, transitioned toProtonix BID Follow-up pending H.pylori serology Follow H/H Follow LFTs Candida esophagitis: discussed with pharmacy and will give 200 mg Diflucan as loading dose and 100 mg daily thereafter for a total of 21 days.    Annitta Needs, ANP-BC [facility]       LOS: 5 days    01/04/2016, 7:59 AM

## 2016-01-04 NOTE — Progress Notes (Signed)
Inpatient Diabetes Program Recommendations  AACE/ADA: New Consensus Statement on Inpatient Glycemic Control (2015)  Target Ranges:  Prepandial:   less than 140 mg/dL      Peak postprandial:   less than 180 mg/dL (1-2 hours)      Critically ill patients:  140 - 180 mg/dL   Results for KOVIN, DEMMA (MRN [id number]) as of 01/04/2016 07:42  Ref. Range 01/10/2016 04:15 01/11/2016 07:58 01/01/2016 11:47 12/22/2015 16:05 01/05/2016 21:03 01/04/2016 00:49 01/04/2016 04:04  Glucose-Capillary Latest Ref Range: 65 - 99 mg/dL 236 (H) 110 (H) 205 (H) 221 (H) 154 (H) 264 (H) 243 (H)   Review of Glycemic Control  Diabetes history: DM2 Outpatient Diabetes medications: None listed on home medication list Current orders for Inpatient glycemic control: CBG monitoring  Inpatient Diabetes Program Recommendations: Correction (SSI): May want to consider ordering very sensitive custom correction scale since CBGs are consistently elevated.  Thanks, Barnie Alderman, RN, MSN, CDE Diabetes Coordinator Inpatient Diabetes Program [phone] (Team Pager from 8am to 5pm)

## 2016-01-04 NOTE — Consult Note (Signed)
SURGICAL CONSULTATION NOTE (initial) - cpt: 99255  HISTORY OF PRESENT ILLNESS (HPI):  70 y.o. male admitted to [facility] 5 days ago for sepsis with shock liver and profound refractory hypoglycemia to blood glucose of 23 despite administration of glucose prior to ED arrival and again dropping to <10 12/15 with profuse watery diarrhea, followed by GI bleed. It seems patient has a longstanding history of non-ischemic cardiomyopathy with aortic stenosis, complicated by CKD, for which he started HD via RUE AV fistula in 03/2015 in the context of chronic progressive respiratory difficulty that his family reports improved after he started dialysis. However, patient's respiratory status again worsened in November, culminating in his presentation to [facility] ED 12/10 for worsening respiratory function. He was discharged home, underwent routine outpatient HD the next day (Monday, 12/11), after which he presented to his PMD the next day with non-bloody diarrhea and worsening abdominal pain, for which he was referred to [facility] ED along with altered mental status. During this admission, EGD for GI bleeding revealed diffuse ischemic ulcerations, and upon waking from procedural sedation yesterday, he began complaining of B/L severe foot pain, relieved partially with dependent position. CTA aorta with runoff was performed today after nephrology confirmed improvement from ESRD is unlikely.  Surgery is consulted by medical physician Dr. Candiss Norse in this context for evaluation and management of B/L lower extremity arterial insufficiency. Due to patient's confusion and overall condition, most of history is obtained from patient's chart and family.  PAST MEDICAL HISTORY (PMH):  Past Medical History:  Diagnosis Date  . Adenomatous colon polyp 2006  . AICD (automatic cardioverter/defibrillator) present    STJ device, implanted 12/03/14 Dr. Lovena Le  . Arthritis    Gout  . Benign prostatic hypertrophy   . CHF (congestive heart failure)  ([place])   . Chronic combined systolic and diastolic CHF (congestive heart failure) ([place])    a. 02/2014 Echo: EF 20-25% (new), Gr 2 DD, mod conc LVH, mild AI/AS, mod MR, sev dil LA, mild TR.  Marland Kitchen Chronic kidney disease (CKD), stage IV (severe) ([place]) 12/2009   a. baseline creat now ~ 4.  . Colonic polyp   . Constipation   . 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 Erectile dysfunction   . Gout   . H. pylori infection 2013   treated with prevpac  . Hyperlipidemia   . Hyperparathyroidism ([place])   . Hypertension   . Low serum testosterone level   . Mild Ao Stenosis and Insufficiency   . Nonischemic cardiomyopathy ([place])    a. 02/2014 Echo: EF 20-25%;  b. 03/2014 Cath: LM nl, LAD min irregs, LCX   . Obesity, Class I, BMI 30-34.9   . Shortness of breath dyspnea   . Sleep apnea 2010   does not use CPAP  . Tobacco abuse      PAST SURGICAL HISTORY ([place]):  Past Surgical History:  Procedure Laterality Date  . AV FISTULA PLACEMENT Right 04/02/2015   Procedure: RIGHT ARM RADIOCEPHALIC ARTERIOVENOUS (AV) FISTULA CREATION;  Surgeon: Conrad Ramos, MD;  Location: [facility];  Service: Vascular;  Laterality: Right;  . CARDIAC CATHETERIZATION    . CHOLECYSTECTOMY N/A 01/07/2015   Procedure: LAPAROSCOPIC CHOLECYSTECTOMY WITH INTRAOPERATIVE CHOLANGIOGRAM;  Surgeon: Donnie Mesa, MD;  Location: [facility];  Service: General;  Laterality: N/A;  . COLONOSCOPY  08/23/2004   Dr. Vivi Ferns rectum, diminutive polyp of the rectosigmoid removed, inflamed focally adenomatous polyp  . COLONOSCOPY  2013   Dr. Gala Romney:  multiple diminutive polyps in distal sigmoid segment, 4 mm pedunculated polyp at hepatic flexure. Tubular adenomas. Surveillance due 2018   . EP IMPLANTABLE DEVICE N/A 12/03/2014   Procedure: ICD Implant;  Surgeon: Evans Lance, MD;  Location: [facility];  Service: Cardiovascular;  Laterality: N/A;; St Jude  . ERCP N/A 01/09/2015   Procedure: ENDOSCOPIC RETROGRADE  CHOLANGIOPANCREATOGRAPHY (ERCP);  Surgeon: Clarene Essex, MD;  Location: [facility] ENDOSCOPY;  Service: Endoscopy;  Laterality: N/A;  . ESOPHAGOGASTRODUODENOSCOPY  2013   Dr. Gala Romney: erosions reminiscent of GAVE but biopsies showed H.pylori gastritis  . INSERTION OF DIALYSIS CATHETER Right 04/02/2015   Procedure: INSERTION OF DIALYSIS CATHETER - RIGHT INTERNAL JUGULAR;  Surgeon: Conrad Chloride, MD;  Location: [facility];  Service: Vascular;  Laterality: Right;  . LEFT AND RIGHT HEART CATHETERIZATION WITH CORONARY ANGIOGRAM N/A 03/19/2014   Procedure: LEFT AND RIGHT HEART CATHETERIZATION WITH CORONARY ANGIOGRAM;  Surgeon: Blane Ohara, MD;  Location: [facility] CATH LAB;  Service: Cardiovascular;  Laterality: N/A;  . LYMPH NODE BIOPSY     surgical exploration of neck-not entirely clear that this represented a lymph node biopsy  . RETINAL LASER PROCEDURE     diabetic retinopathy  . UMBILICAL HERNIA REPAIR N/A 01/07/2015   Procedure: HERNIA REPAIR UMBILICAL ADULT;  Surgeon: Donnie Mesa, MD;  Location: [facility];  Service: General;  Laterality: N/A;     MEDICATIONS:  Prior to Admission medications   Medication Sig Start Date End Date Taking? Authorizing Provider  albuterol (PROVENTIL) (2.5 MG/3ML) 0.083% nebulizer solution as directed. 12/07/15  Yes Historical Provider, MD  allopurinol (ZYLOPRIM) 100 MG tablet Take 1 tablet (100 mg total) by mouth daily. 11/11/15  Yes Susy Frizzle, MD  aspirin EC 81 MG tablet Take 81 mg by mouth daily.   Yes Historical Provider, MD  calcium acetate (PHOSLO) 667 MG capsule Take 1 capsule (667 mg total) by mouth daily. Take with the biggest meal. 06/05/15  Yes Susy Frizzle, MD  Cholecalciferol (VITAMIN D PO) Take 1 capsule by mouth daily.   Yes Historical Provider, MD  lidocaine-prilocaine (EMLA) cream as directed.  06/19/15  Yes Historical Provider, MD  metoprolol succinate (TOPROL-XL) 50 MG 24 hr tablet Take 1 tablet by mouth daily. 10/29/15  Yes Historical Provider, MD  ONE TOUCH  ULTRA TEST test strip  06/02/15  Yes Historical Provider, MD  promethazine (PHENERGAN) 12.5 MG tablet Take 1 tablet (12.5 mg total) by mouth every 6 (six) hours as needed for nausea or vomiting. 12/29/15  Yes Alycia Rossetti, MD  tiZANidine (ZANAFLEX) 4 MG capsule Take 1 capsule (4 mg total) by mouth 3 (three) times daily as needed for muscle spasms. 12/25/15  Yes Susy Frizzle, MD  amoxicillin (AMOXIL) 875 MG tablet  12/25/15   Historical Provider, MD     ALLERGIES:  Allergies  Allergen Reactions  . Ace Inhibitors Cough  . Ativan [Lorazepam] Other (See Comments)    Increased agitation, hallucinations     SOCIAL HISTORY:  Social History   Social History  . Marital status: Married    Spouse name: N/A  . Number of children: 4  . Years of education: N/A   Occupational History  . Semi - Retired Administrator    Social History Main Topics  . Smoking status: Current Some Day Smoker    Packs/day: 0.25    Years: 56.00    Types: Cigarettes    Start date: 05/31/1958  . Smokeless tobacco: Never Used  . Alcohol  use No  . Drug use: No  . Sexual activity: Not Currently   Other Topics Concern  . Not on file   Social History Narrative  . No narrative on file    The patient currently resides (home / rehab facility / nursing home): Home  The patient normally is (ambulatory / bedbound): Minimally ambulatory (a few feet with assistance occasionally)   FAMILY HISTORY:  Family History  Problem Relation Age of Onset  . Hypertension Mother   . Cancer Mother   . Cancer Father   . Diabetes Sister     REVIEW OF SYSTEMS: - significantly limited due to patient's overall condition as per [facility] Cardiovascular: denies chest pain, palpitations  Gastrointestinal: abdominal pain, N/V, diarrhea, and GI bleeding as per HPI Musculoskeletal: severe B/L foot pain as per HPI   VITAL SIGNS:  Temp:  [97.3 F (36.3 C)-98.3 F (36.8 C)] 98.3 F (36.8 C) (12/18 0800) Pulse Rate:  [74-85] 74 (12/18  0800) Resp:  [11-23] 18 (12/18 0800) BP: (121-155)/(37-67) 151/43 (12/18 0800) SpO2:  [90 %-100 %] 90 % (12/18 0800) Weight:  [84.2 kg (185 lb 10 oz)] 84.2 kg (185 lb 10 oz) (12/18 0600)     Height: 5\' 9"  (175.3 cm) Weight: 84.2 kg (185 lb 10 oz) BMI (Calculated): 27.9   INTAKE/OUTPUT:  This shift: Total I/O In: 50 [I.V.:50] Out: -   Last 2 shifts: @IOLAST2SHIFTS @   PHYSICAL EXAM:  Constitutional:  -- Normal body habitus  -- Awake and makes eye contact, but only oriented to self Eyes:  -- Pupils equally round and reactive to light  -- No scleral icterus  Ear, nose, and throat:  -- No jugular venous distension  Pulmonary:  -- No crackles  -- Equal breath sounds bilaterally -- Breathing non-labored at rest Cardiovascular:  -- S1, S2 present  -- No pericardial rubs Gastrointestinal:  -- Abdomen soft, mild-/moderately- tender to palpation, non-distended, no guarding/rebound  -- No abdominal masses appreciated, pulsatile or otherwise  Musculoskeletal and Integumentary:  -- Wounds or skin discoloration: Mottling with mid-calf demarcation of B/L lower extremities -- Extremities: B/L UE FROM with warm hands, feet B/L cold to touch with minimal movement of only Right 1st toe able to be elicited and absent sensation to mid-calf level B/L and no movement to at least knee level B/L Neurologic:  -- Motor function: B/L lower extremity motor absent to ~knee level except ~movement of Right 1st toe, B/L UE FROM -- Sensation: lower extremities absent B/L to at knee level  Pulse/Doppler Exam: (p=palpable; d=doppler signals; 0=none)     Right   Left   Fem  p   p   Pop  p   p   DP  0   0   PT  0   0   Per  0   0   Labs:  CBC Latest Ref Rng & Units 01/04/2016 12/22/2015 01/02/2016  WBC 4.0 - 10.5 K/uL 15.0(H) 16.9(H) -  Hemoglobin 13.0 - 17.0 g/dL 9.0(L) 9.4(L) 9.7(L)  Hematocrit 39.0 - 52.0 % 26.3(L) 27.4(L) 28.1(L)  Platelets 150 - 400 K/uL 83(L) 82(L) -   CMP Latest Ref Rng &  Units 01/04/2016 12/23/2015 01/02/2016  Glucose 65 - 99 mg/dL 255(H) 239(H) 479(H)  BUN 6 - 20 mg/dL 88(H) 74(H) 55(H)  Creatinine 0.61 - 1.24 mg/dL 8.91(H) 7.63(H) 5.59(H)  Sodium 135 - 145 mmol/L 129(L) 135 126(L)  Potassium 3.5 - 5.1 mmol/L 4.5 3.9 3.8  Chloride 101 - 111 mmol/L 92(L) 97(L)  90(L)  CO2 22 - 32 mmol/L 22 25 23   Calcium 8.9 - 10.3 mg/dL 8.0(L) 8.6(L) 8.2(L)  Total Protein 6.5 - 8.1 g/dL 4.6(L) 4.4(L) 4.2(L)  Total Bilirubin 0.3 - 1.2 mg/dL 1.3(H) 1.5(H) 1.6(H)  Alkaline Phos 38 - 126 U/L 97 109 94  AST 15 - 41 U/L 212(H) 377(H) 819(H)  ALT 17 - 63 U/L 806(H) 1,124(H) 1,461(H)    Imaging studies:  CTA Aorta with B/L Lower Extremity Runoff (01/08/2016) - personally reviewed and interpreted beyond documented radiology interpretation 1. Diminished opacification of the bilateral anterior tibial, posterior tibial and fibular arteries. Given the symmetric appearance, the findings may be secondary to an effect of contrast bolus timing. However, a degree of stenosis or occlusion would be difficult to exclude, particularly of the anterior tibial arteries, which show decreased opacification at a more proximal level than the posterior tibial and fibular arteries. Lower extremity vascular ultrasound may be helpful for further evaluation. 2. No occlusion or high grade stenosis of the femoral or popliteal arteries. 3. Aortic atherosclerosis. No dissection or high-grade stenosis of the major abdominal and pelvic arteries.  Assessment/Plan: (ICD-10's: R57.0) 70 y.o. male with profound systemic hypoperfusion, likely secondary to cardiogenic shock exacerbated by post-HD hypotension (hypovolemic shock) and subsequent diarrhea-associated hypovolemia, followed by severe hypoperfusion of his liver (resulting in now improved refractory hypoglycemia), GI tract (causing abdominal pain, diarrhea, and GI bleed), brain (likely a degree of anoxic brain injury), and B/L lower extremity ischemia  with mottling and absent motor and sensation without evidence of supra-popliteal atherosclerotic arterial disease, also complicated by pertinent comorbidities including non-ischemic cardiomyopathy (CHF), aortic valvular stenosis and insufficiency, ESRD, DM, HTN, and HLD.   - avoid further hypotension/hypovolemia  - considering extensive tissue loss and non-ambulatory patient, only option for lower extremity ischemia is B/L above-knee amputations  - patient's family expresses wishes for inpatient hospice rather than protracted recovery following B/L AKA considering underlying co-morbidities and overall poor prognosis  - pain control as needed, maintain reverse Trendelenburg position (feet down) for symptomatic relief  - medical management of co-morbidities as per medical team  - please call if any questions or changes  - DVT prophylaxis  All of the above findings and recommendations were discussed at length with the patient's family, who express little surprise having anticipated such deterioration, and all of patient's family's questions were answered to their expressed satisfaction.  Thank you for the opportunity to participate in this patient's care.  -- Marilynne Drivers Rosana Hoes, MD, [place]: [facility] Office: [phone]

## 2016-01-04 NOTE — Progress Notes (Signed)
Pt did not receive dialysis today due to multiple sticks into his fistula. Pt wife ask to try treatment tomorrow. Dr Lowanda Foster and dr Rosana Hoes notified.

## 2016-01-04 NOTE — Progress Notes (Signed)
Report from CT Bifem exam recorded in EPIC.  MD Candiss Norse called and updated with patient results at this time. No further orders received at this time. Will continue to monitor the patient closely.

## 2016-01-04 NOTE — Progress Notes (Addendum)
PROGRESS NOTE                                                                                                                                                                                                             Patient Demographics:    [name], is a 70 y.o. male, DOB - 28-Sep-1945, [id number]  Admit date - 12/21/2015   Admitting Physician Thurnell Lose, MD  Outpatient Primary MD for the patient is Odette Fraction, MD  LOS - 5  Chief Complaint  Patient presents with  . Hypoglycemia       Brief Narrative    [name]  is a 70 y.o. male, with H/O Nonischemic cardiomyopathy with Chronic combined systolic and diastolic CHF  EF 123456 in 0000000 Has AICD, ESRD MWF schedule, Dyslipidemia, hypertension, gout, BPH, ongoing smoking abuse, sleep apnea and does not wear CPAP, who has had cholecystectomy comes to the hospital with 2 day history of diarrhea, generalized abdominal ache, continued to feel poorly and came to the ER where his initial workup suggested dehydration, sepsis/shock with elevated lactic acid levels of 15, hypoglycemia and Shock liver. CT scan of the abdomen was nonspecific with some pneumobilia consistent with previous sphincterotomy. GI was consulted and I was requested to admit.  Since his admission renal and GI are following, he is due to undergo EGD for possible upper GI bleed on 01/14/2016, his liver function is improving. He is overall showing signs of gradual clinical improvement.      Subjective:    [name] today has, No headache, No chest pain, mild abdominal pain - No Nausea, No new weakness tingling or numbness, No Cough - SOB.  He is sleepy this morning.   Assessment  & Plan :     1. Sepsis - with severe hypotension and hypoperfusion- induced shock liver, Multiple GI ulcers and possible ischemic injury to both lower extremities -  chances are that he could have simply been  hypotensive due to viral gastroenteritis diarrhea induced dehydration in the setting of extremely poor EF & AS at baseline causing hypotension and shock liver.   A.  Shock liver. Ac.Hepatitis panel -ve, Vascular US Nonacute, GI Following, he did have significant leukocytosis with no clear source except questionable UA sample in a dialysis patient, with hydration and supportive care his numbers have improved, liver  function is improving, INR now close to normal, leukocytosis improving, he remained afebrile throughout, will stop Zosyn on 01/04/2016 and monitor.   B. Later in this admission he had evidence of some upper GI blood loss for which he was seen by GI and he underwent EGD on 12/31/2015. Showing multiple upper GI ulcers secondary to ischemic injury, now on PPI. No signs of active ongoing bleeding. Per GI okay to start on 81 mg of aspirin and monitor.  C. complains of bilateral lower extremity pain evening of 01/07/2016. He at baseline has chronically cold feet per wife which remains the same, however he does have discoloration below the knee bilaterally in the posterior aspects of both legs, a stat CT angiogram of abdomen and pelvis with lower extremity runoff was obtained on 12/25/2015 which does not show any occlusion but did show changes suggestive of hypoperfusion again, at this time options are very limited.   We will place him on low-dose aspirin, try to avoid hypotension, informed nephrology as well to be cautious during dialysis treatments. Will get surgical input as if ischemic injury advances he might end up getting bilateral AKA's, we'll repeat echocardiogram to rule out any valvular abnormalities on top of poor systolic function, family at bedside they agree with the plan and accept it.     2. Anemia of chronic disease along with likely occult GI blood loss related anemia. This was upper GI bleed due to multiple ischemic ulcers found on EGD on 01/01/2016, he received 1 unit of PRBC  transfused on 01/02/2016, he is on IV PPI drip currently, GI following, we're monitoring H&H and is due to undergo EGD on 01/16/2016.   3. Chronic combined systolic and diastolic CHF EF 123456 with AICD history of nonischemic myopathy + AS. Likely contribution to ischemic insult when he got dehydrated from diarrhea, repeat echocardiogram on 01/04/2016.   4. Hypoglycemia due to combination of #1 above, diarrhea & poor oral intake. Sugars are labile but he tends to get hypoglycemic very easily, his oral intake has improved with stop D5W drip and monitor on low-dose sliding scale before every meal . Stable TSH & random cortisol.  Lab Results  Component Value Date   HGBA1C 5.8 (H) 12/31/2015   CBG (last 3)   Recent Labs  01/04/16 0049 01/04/16 0404 01/04/16 0823  GLUCAP 264* 243* 251*   Results for [name], [name] (MRN [id number]) as of 01/05/2016 09:17  Ref. Range 01/01/2016 19:04  Cortisol, Plasma Latest Units: ug/dL 53.5  TSH Latest Ref Range: 0.350 - 4.500 uIU/mL 1.763    5.ESRD. On MWF schedule, renal following.  6. COPD. Currently stable at baseline. Supportive care only.  7. 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 discussed with cardiology, stable EKG no acute workup. Troponin trend flat and non-ACS pattern. No chest pain. No acute ST changes on EKG.  8. Gout. Stable. Resume allopurinol .  9. ? UTI - UA obtained in the ESRD patient, specimen likely dirty and probably colonized, for now Zosyn to continue. Follow cultures. We'll likely stop antibiotics by 01/05/2016 if he remains clinically stable.  10. Delirium. Avoid benzodiazepines, Haldol as needed. CT head and Ammonia stable. Delirium much improved increase activity.  11. Tobacco abuse. Counseled to quit.  12. Thrombocytopenia. Likely due to acute illness and shock liver, will monitor. Trend is stable.     Family Communication  :  Wife & daughter bedside  Code Status :  DNR  Diet : Diet full liquid Room service appropriate? Yes;  Fluid  consistency: Thin   Disposition Plan  :  Stepdown continue  Consults  :  GI, Renal, Gen. surgery  Procedures  :    CT - small pneumobilia, past Cholecystectomy with sphincterotomy, unspecific fluid collection in the abdomen  Vascular ultrasound of the abdomen - non acute  CT Head - non acute  EGD   12/23/2015 - multiple ischemic ulcers  TTE ordered   DVT Prophylaxis  :    SCD   Lab Results  Component Value Date   PLT 83 (L) 01/04/2016    Inpatient Medications  Scheduled Meds: . calcium acetate  667 mg Oral Q lunch  . epoetin (EPOGEN/PROCRIT) injection  4,000 Units Intravenous Q M,W,F-HD  . feeding supplement (NEPRO CARB STEADY)  237 mL Oral Q24H  . lidocaine-prilocaine   Topical Once  . mouth rinse  15 mL Mouth Rinse BID  . metoprolol succinate  50 mg Oral Daily  . nicotine  21 mg Transdermal Daily  . pantoprazole  40 mg Oral BID AC  . piperacillin-tazobactam (ZOSYN)  IV  3.375 g Intravenous Q12H   Continuous Infusions: . dextrose 30 mL/hr at 12/24/2015 1522   PRN Meds:.sodium chloride, alteplase, dextrose, diphenhydrAMINE, haloperidol lactate, heparin, HYDROcodone-acetaminophen, lidocaine (PF), lidocaine-prilocaine, [DISCONTINUED] ondansetron **OR** ondansetron (ZOFRAN) IV, pentafluoroprop-tetrafluoroeth  Antibiotics  :    Anti-infectives    Start     Dose/Rate Route Frequency Ordered Stop   01/10/2016 2200  piperacillin-tazobactam (ZOSYN) IVPB 3.375 g     3.375 g 12.5 mL/hr over 240 Minutes Intravenous Every 12 hours 12/31/2015 1043     01/12/2016 1800  vancomycin (VANCOCIN) 50 mg/mL oral solution 500 mg  Status:  Discontinued    Comments:  Stool study pending pretty   500 mg Oral Every 6 hours 12/31/2015 1515 12/20/2015 1654   01/14/2016 1045  piperacillin-tazobactam (ZOSYN) IVPB 3.375 g     3.375 g 100 mL/hr over 30 Minutes Intravenous STAT 01/10/2016 1042 12/31/2015 1155   01/16/2016 1045  vancomycin (VANCOCIN) IVPB 1000 mg/200 mL premix     1,000 mg 200 mL/hr over  60 Minutes Intravenous  Once 12/26/2015 1042 01/04/2016 1231         Objective:   Vitals:   01/04/16 0500 01/04/16 0600 01/04/16 0700 01/04/16 0800  BP: (!) 139/40 (!) 155/37 (!) 142/45 (!) 151/43  Pulse: 78 76  74  Resp: 14 17 18 18   Temp:    98.3 F (36.8 C)  TempSrc:    Axillary  SpO2: 100% 100%  90%  Weight:  84.2 kg (185 lb 10 oz)    Height:        Wt Readings from Last 3 Encounters:  01/04/16 84.2 kg (185 lb 10 oz)  12/29/15 82.6 kg (182 lb)  12/27/15 84.4 kg (186 lb)     Intake/Output Summary (Last 24 hours) at 01/04/16 1007 Last data filed at 01/04/16 0800  Gross per 24 hour  Intake           1314.5 ml  Output                0 ml  Net           1314.5 ml     Physical Exam  less sleepy this morning, No new F.N deficits, Normal affect [place].AT,PERRAL Supple Neck,No JVD, No cervical lymphadenopathy appriciated.  Symmetrical Chest wall movement, Good air movement bilaterally, CTAB RRR,No Gallops,Rubs or new Murmurs, No Parasternal Heave +ve B.Sounds, Abd Soft, mild epigastric tenderness, No organomegaly appriciated, No  rebound - guarding or rigidity. No Cyanosis, Clubbing or edema,  Does have baseline bilateral cold feet per wife that is unchanged, however there is discoloration bilaterally symmetrical in both legs below knee on the posterior aspect which is new      Data Review:    CBC  Recent Labs Lab 01/11/2016 1013  12/31/15 0453 01/01/16 0401  01/02/16 0058 01/02/16 0441 01/02/16 1446 01/07/2016 0410 01/04/16 0422  WBC 22.5*  < > 24.9* 26.3*  --   --  18.3*  --  16.9* 15.0*  HGB 10.1*  < > 9.6* 9.0*  < > 8.9* 7.5* 9.7* 9.4* 9.0*  HCT 31.1*  < > 29.6* 28.3*  < > 26.1* 22.7* 28.1* 27.4* 26.3*  PLT 223  < > 156 174  --   --  96*  --  82* 83*  MCV 97.8  < > 93.1 95.6  --   --  92.7  --  89.5 90.1  MCH 31.8  < > 30.2 30.4  --   --  30.6  --  30.7 30.8  MCHC 32.5  < > 32.4 31.8  --   --  33.0  --  34.3 34.2  RDW 17.7*  < > 17.0* 17.4*  --   --  18.0*  --   19.9* 20.3*  LYMPHSABS 0.4*  --   --   --   --   --   --   --   --   --   MONOABS 1.5*  --   --   --   --   --   --   --   --   --   EOSABS 0.0  --   --   --   --   --   --   --   --   --   BASOSABS 0.0  --   --   --   --   --   --   --   --   --   < > = values in this interval not displayed.  Chemistries   Recent Labs Lab 01/01/16 0401 01/01/16 0855 01/02/16 0441 01/08/2016 0410 01/04/16 0422  NA 134* 132* 126* 135 129*  K 6.1* 6.3* 3.8 3.9 4.5  CL 92* 91* 90* 97* 92*  CO2 13* 12* 23 25 22   GLUCOSE 115* 147* 479* 239* 255*  BUN 78* 84* 55* 74* 88*  CREATININE 8.34* 8.80* 5.59* 7.63* 8.91*  CALCIUM 10.0 9.8 8.2* 8.6* 8.0*  MG  --   --  2.0  --   --   AST 1,237* 1,185* 819* 377* 212*  ALT 2,047* 2,002* 1,461* 1,124* 806*  ALKPHOS 121 112 94 109 97  BILITOT 1.3* 1.4* 1.6* 1.5* 1.3*   ------------------------------------------------------------------------------------------------------------------ No results for input(s): CHOL, HDL, LDLCALC, TRIG, CHOLHDL, LDLDIRECT in the last 72 hours.  Lab Results  Component Value Date   HGBA1C 5.8 (H) 12/31/2015   ------------------------------------------------------------------------------------------------------------------  Recent Labs  01/01/16 1904  TSH 1.763   ------------------------------------------------------------------------------------------------------------------ No results for input(s): VITAMINB12, FOLATE, FERRITIN, TIBC, IRON, RETICCTPCT in the last 72 hours.  Coagulation profile  Recent Labs Lab 01/04/2016 1704 12/31/15 1152 01/01/16 0401 01/02/16 0441 01/04/16 0422  INR 2.40 2.14 2.47 2.28 1.44    No results for input(s): DDIMER in the last 72 hours.  Cardiac Enzymes No results for input(s): CKMB, TROPONINI, MYOGLOBIN in the last 168 hours.  Invalid input(s): CK ------------------------------------------------------------------------------------------------------------------    Component Value  Date/Time   BNP >  4,500.0 (H) 12/27/2015 1012   BNP 1,583.3 (H) 07/12/2011 1823    Micro Results Recent Results (from the past 240 hour(s))  Blood Culture (routine x 2)     Status: None (Preliminary result)   Collection Time: 12/31/2015 10:13 AM  Result Value Ref Range Status   Specimen Description BLOOD LEFT ARM  Final   Special Requests BOTTLES DRAWN AEROBIC AND ANAEROBIC 5 CC EACH  Final   Culture NO GROWTH 4 DAYS  Final   Report Status PENDING  Incomplete  Blood Culture (routine x 2)     Status: None (Preliminary result)   Collection Time: 01/15/2016 10:13 AM  Result Value Ref Range Status   Specimen Description BLOOD LEFT ARM  Final   Special Requests BOTTLES DRAWN AEROBIC AND ANAEROBIC 7 CC EACH  Final   Culture NO GROWTH 4 DAYS  Final   Report Status PENDING  Incomplete  C difficile quick scan w PCR reflex     Status: None   Collection Time: 01/12/2016  1:56 PM  Result Value Ref Range Status   C Diff antigen NEGATIVE NEGATIVE Final   C Diff toxin NEGATIVE NEGATIVE Final   C Diff interpretation No C. difficile detected.  Final  MRSA PCR Screening     Status: None   Collection Time: 12/24/2015  2:21 PM  Result Value Ref Range Status   MRSA by PCR NEGATIVE NEGATIVE Final    Comment:        The GeneXpert MRSA Assay (FDA approved for NASAL specimens only), is one component of a comprehensive MRSA colonization surveillance program. It is not intended to diagnose MRSA infection nor to guide or monitor treatment for MRSA infections.   Gastrointestinal Panel by PCR , Stool     Status: None   Collection Time: 12/31/15  8:22 AM  Result Value Ref Range Status   Campylobacter species NOT DETECTED NOT DETECTED Final   Plesimonas shigelloides NOT DETECTED NOT DETECTED Final   Salmonella species NOT DETECTED NOT DETECTED Final   Yersinia enterocolitica NOT DETECTED NOT DETECTED Final   Vibrio species NOT DETECTED NOT DETECTED Final   Vibrio cholerae NOT DETECTED NOT DETECTED Final    Enteroaggregative E coli (EAEC) NOT DETECTED NOT DETECTED Final   Enteropathogenic E coli (EPEC) NOT DETECTED NOT DETECTED Final   Enterotoxigenic E coli (ETEC) NOT DETECTED NOT DETECTED Final   Shiga like toxin producing E coli (STEC) NOT DETECTED NOT DETECTED Final   Shigella/Enteroinvasive E coli (EIEC) NOT DETECTED NOT DETECTED Final   Cryptosporidium NOT DETECTED NOT DETECTED Final   Cyclospora cayetanensis NOT DETECTED NOT DETECTED Final   Entamoeba histolytica NOT DETECTED NOT DETECTED Final   Giardia lamblia NOT DETECTED NOT DETECTED Final   Adenovirus F40/41 NOT DETECTED NOT DETECTED Final   Astrovirus NOT DETECTED NOT DETECTED Final   Norovirus GI/GII NOT DETECTED NOT DETECTED Final   Rotavirus A NOT DETECTED NOT DETECTED Final   Sapovirus (I, II, IV, and V) NOT DETECTED NOT DETECTED Final  KOH prep     Status: None   Collection Time: 12/27/2015  9:28 AM  Result Value Ref Range Status   Specimen Description ESOPHAGUS  Final   Special Requests NONE  Final   KOH Prep FEW YEAST WITH PSEUDOHYPHAE  Final   Report Status 01/08/2016 FINAL  Final    Radiology Reports Ct Abdomen Pelvis Wo Contrast  Result Date: 01/17/2016 CLINICAL DATA:  They were called out for unconscious. Upon arrival they took patients blood sugar and  it was 23; sepsis; elevated liver enzymes; ams EXAM: CT ABDOMEN AND PELVIS WITHOUT CONTRAST TECHNIQUE: Multidetector CT imaging of the abdomen and pelvis was performed following the standard protocol without IV contrast. COMPARISON:  CT 01/03/2015 FINDINGS: Lower chest: Lung bases are clear. Hepatobiliary: 3.6 cm cyst in the LEFT lateral hepatic lobe. Pneumobilia in common bile duct and LEFT hepatic lobe. Interval cholecystectomy. No biliary dilatation. Pancreas: Pancreas is normal. No ductal dilatation. No pancreatic inflammation. Spleen: Normal spleen Adrenals/urinary tract: Adrenal glands are mildly thickened. There are several simple fluid attenuation lesions  associated with the kidneys. No ureterolithiasis or obstructive uropathy. Foley catheter within collapsed bladder. Stomach/Bowel: Stomach is distended by fluid. No obstructing lesions identified. The small bowel and cecum are normal. The appendix is normal. The colon and rectosigmoid colon are normal. Vascular/Lymphatic: Abdominal aorta is normal caliber with atherosclerotic calcification. There is no retroperitoneal or periportal lymphadenopathy. No pelvic lymphadenopathy. Reproductive: Prostate normal Other: No free fluid. Musculoskeletal: Round well-circumscribed lesion in the LEFT femoral neck has a thin sclerotic rim appears benign. No change from comparison examNo aggressive osseous lesion. IMPRESSION: 1. Large volume of fluid within the stomach suggests gastroparesis 2. Pneumobilia is new from comparison exam. Interval cholecystectomy and presumably a sphincterotomy. 3.  Atherosclerotic calcification of the aorta. 4. Foley catheter in bladder. 5. Stable benign-appearing sclerotic lesion in the LEFT femur. Electronically Signed   By: Suzy Bouchard M.D.   On: 12/26/2015 12:10   Dg Chest 2 View  Result Date: 12/27/2015 CLINICAL DATA:  Cough and shortness of breath. EXAM: CHEST  2 VIEW COMPARISON:  April 02, 2015 FINDINGS: Stable cardiomegaly. The hila and mediastinum are unchanged. No nodules or masses. No focal infiltrates or overt edema. IMPRESSION: No active cardiopulmonary disease. Electronically Signed   By: Dorise Bullion III M.D   On: 12/27/2015 10:17   Ct Head Wo Contrast  Result Date: 01/01/2016 CLINICAL DATA:  Disoriented EXAM: CT HEAD WITHOUT CONTRAST TECHNIQUE: Contiguous axial images were obtained from the base of the skull through the vertex without intravenous contrast. COMPARISON:  01/02/2016 FINDINGS: Brain: No acute territorial infarction, intracranial hemorrhage or extra-axial fluid collection. No focal mass, mass effect or midline shift. Mild atrophy. Mild periventricular white  matter hypodensity consistent with small vessel disease. Smudgy hypodensities within the pons could also relate to small vessel change. Mild hypodensity at the left cerebellar peduncle is unchanged. Ventricles are similar in size and morphology. Bilateral basal ganglial calcifications. Vascular: No hyperdense vessels.  Carotid artery calcifications. Skull: Mastoid air cells demonstrate minimal fluid on the left. No skull fracture. Sinuses/Orbits: Mucosal thickening or retention cyst in the left maxillary sinus. No acute orbital abnormality. Other: None IMPRESSION: Stable CT appearance of the brain, no acute interval findings. Electronically Signed   By: Donavan Foil M.D.   On: 01/01/2016 18:48   Ct Head Wo Contrast  Result Date: 12/18/2015 CLINICAL DATA:  Altered mental status.  Hypoglycemia. EXAM: CT HEAD WITHOUT CONTRAST TECHNIQUE: Contiguous axial images were obtained from the base of the skull through the vertex without intravenous contrast. COMPARISON:  None. FINDINGS: Brain: There is mild to moderate diffuse atrophy. There is no intracranial mass, hemorrhage, extra-axial fluid collection, or midline shift. There is patchy small vessel disease in the centra semiovale bilaterally, primarily posteriorly and superiorly. Elsewhere gray-white compartments appear unremarkable. No acute infarct is evident. There is calcification in each basal ganglia region, a physiologic finding in this age group. Vascular: There is no hyperdense vessel. There are foci of calcification in each  carotid siphon region. Skull: The bony calvarium appears intact. Sinuses/Orbits: There is a retention cyst in the left maxillary antrum. There is mild mucosal thickening in several ethmoid air cells bilaterally. Other visualized paranasal sinuses are clear. Orbits appear symmetric bilaterally. Other: There is opacification of several inferior mastoid air cells on the left. Visualized mastoid air cells elsewhere appear clear. There is mild  debris in each external auditory canal. IMPRESSION: Atrophy with patchy periventricular small vessel disease. No intracranial mass, hemorrhage, or extra-axial fluid collection. No acute infarct evident. Basal ganglia calcification bilaterally is physiologic in this age group. Foci of arterial vascular calcification noted. Mild left-sided mastoid air cell disease. Probable cerumen in each external auditory canal. Foci of paranasal sinus disease at several sites. Electronically Signed   By: Lowella Grip III M.D.   On: 12/20/2015 11:00   Ct Angio Ao+bifem W &/or Wo Contrast  Result Date: 12/19/2015 CLINICAL DATA:  Bilateral leg pain extending to the feet and toes EXAM: CT ANGIOGRAPHY AOBIFEM WITHOUT AND WITH CONTRAST TECHNIQUE: Arterial phase postcontrast images of the abdominal aorta and both lower extremities were obtained. Multi planer reformatted images and maximum intensity projections were also provided. CONTRAST:  150 mL Isovue 370 IV COMPARISON:  None. FINDINGS: VASCULAR FINDINGS Aorta: There is atherosclerotic calcification within the abdominal aorta. No aneurysm, dissection or focal ulceration. Celiac axis: There is atherosclerotic calcification at the origin without high-grade stenosis. No dissection. Normal celiac branching pattern. Superior mesenteric artery: Atherosclerotic calcification at the origin without high-grade stenosis or dissection. Renal arteries: Single right and duplicated left renal arteries. There is atherosclerotic calcification at the origin of the right renal artery. Inferior mesenteric artery:  Patent. Common iliac arteries: Patent without aneurysm or stenosis. Multifocal atherosclerotic calcification. Internal iliac arteries: Mild atherosclerotic calcification but otherwise normal. External iliac arteries: There is proximal atherosclerotic calcification within both external iliac arteries, without associated stenosis. The deep circumflex and inferior epigastric arteries are  patent. Runoff: There is normal opacification of both femoral arteries and popliteal arteries, without dissection or high-grade stenosis. There is diminished contrast-enhancement within the anterior tibial, posterior tibial and fibular arteries bilaterally, beginning at the level of the proximal tibial diaphysis. On the right, there is near complete loss of contrast opacification of the anterior tibial artery at the level of the midtibia, while the fibular and posterior tibial arteries remain opacified. The appearance of the left anterior tibial artery is similar. NONVASCULAR FINDINGS Lower chest: No pulmonary nodules. No visible pleural or pericardial effusion. Hepatobiliary: Left hepatic lobe cyst measures 3 cm. Gallbladder is surgically absent. Pancreas: Normal pancreatic contours and enhancement. No peripancreatic fluid collection or pancreatic ductal dilatation. Spleen: Visualized spleen is normal. Adrenals/Urinary Tract: Normal adrenal glands. Bilateral atrophic kidneys with multiple renal cysts. The largest cysts measures 17 mm. Stomach/Bowel: No abnormal bowel dilatation. No bowel wall thickening or adjacent fat stranding to indicate acute inflammation. No abdominal fluid collection. Normal appendix. Lymphatic:  No abdominal or pelvic adenopathy. Reproductive: Prostate calcifications. Normal seminal vesicles. No free fluid in the pelvis. Musculoskeletal: No lytic or blastic osseous lesion. Normal visualized extrathoracic and extraperitoneal soft tissues. Other: No contributory non-categorized findings. Review of the MIP images confirms the above findings. IMPRESSION: 1. Diminished opacification of the bilateral anterior tibial, posterior tibial and fibular arteries. Given the symmetric appearance, the findings may be secondary to an effect of contrast bolus timing. However, a degree of stenosis or occlusion would be difficult to exclude, particularly of the anterior tibial arteries, which show decreased  opacification at  a more proximal level than the posterior tibial and fibular arteries. Lower extremity vascular ultrasound may be helpful for further evaluation. 2. No occlusion or high grade stenosis of the femoral or popliteal arteries. 3. Aortic atherosclerosis. No dissection or high-grade stenosis of the major abdominal and pelvic arteries. 4. Bilateral renal atrophy. Electronically Signed   By: Ulyses Jarred M.D.   On: 12/26/2015 20:44   [place] Art/ven Flow Abd Pelv Doppler  Result Date: 12/31/2015 CLINICAL DATA:  On conscious. Sepsis. Elevated liver function tests. EXAM: DUPLEX ULTRASOUND OF LIVER TECHNIQUE: Color and duplex Doppler ultrasound was performed to evaluate the hepatic in-flow and out-flow vessels. COMPARISON:  None. FINDINGS: Portal Vein Velocities Main:  38 cm/sec Right:  40 cm/sec Left:  13 cm/sec Hepatic Vein Velocities Right:  55 cm/sec Middle:  75 cm/sec Left:  65 cm/sec Hepatic Artery Velocity:  210 cm/sec Splenic Vein Velocity: The patient refused imaging of the splenic vein cm/sec Varices: Absent Ascites: Absent Portal veins are hepatopetal and directionality of flow. Hepatic veins are hepatofugal. IMPRESSION: Hepatic and portal veins are all patent with normal directionality of flow. The patient refused imaging of the splenic vein. Electronically Signed   By: Marybelle Killings M.D.   On: 12/31/2015 14:42   Dg Chest Port 1 View  Result Date: 01/01/2016 CLINICAL DATA:  Shortness of breath, CHF EXAM: PORTABLE CHEST 1 VIEW COMPARISON:  01/04/2016 FINDINGS: Right jugular central venous catheter with the tip projecting over the SVC. There is no focal parenchymal opacity. There is no pleural effusion or pneumothorax. There is mild stable cardiomegaly. There is a single lead cardiac pacemaker. The osseous structures are unremarkable. IMPRESSION: No active disease. Stable cardiomegaly. Electronically Signed   By: Kathreen Devoid   On: 01/01/2016 09:09   Dg Chest Port 1 View  Result Date:  12/24/2015 CLINICAL DATA:  Sepsis EXAM: PORTABLE CHEST 1 VIEW COMPARISON:  12/27/2015 FINDINGS: Cardiac shadow is enlarged. A defibrillator is again noted and stable. The lungs are well aerated bilaterally without focal infiltrate or sizable effusion. No acute bony abnormality is seen. IMPRESSION: No active disease. Electronically Signed   By: Inez Catalina M.D.   On: 01/05/2016 10:42   US Abdomen Limited Ruq  Result Date: 12/31/2015 CLINICAL DATA:  Elevated liver function tests EXAM: US ABDOMEN LIMITED - RIGHT UPPER QUADRANT COMPARISON:  None. FINDINGS: Gallbladder: Absent. Common bile duct: Diameter: 2.6 mm. Liver: Simple cyst in the left lobe measures 3.8 cm. No solid mass. Liver echotexture is heterogeneous. IMPRESSION: Gallbladder is absent compatible with cholecystectomy. Simple cyst in the liver. No solid mass. Liver echotexture is heterogeneous suggesting diffuse hepatic parenchymal disease. Electronically Signed   By: Marybelle Killings M.D.   On: 12/31/2015 14:44    Time Spent in minutes  30   SINGH,PRASHANT K M.D on 01/04/2016 at 10:07 AM  Between 7am to 7pm - Pager - [phone]  After 7pm go to [email or web address] - password [facility]  [facility] -  Office  [phone]

## 2016-01-05 ENCOUNTER — Encounter (HOSPITAL_COMMUNITY): Payer: Self-pay | Admitting: Primary Care

## 2016-01-05 DIAGNOSIS — I359 Nonrheumatic aortic valve disorder, unspecified: Secondary | ICD-10-CM

## 2016-01-05 DIAGNOSIS — I5022 Chronic systolic (congestive) heart failure: Secondary | ICD-10-CM

## 2016-01-05 LAB — COMPREHENSIVE METABOLIC PANEL
ALT: 620 U/L — ABNORMAL HIGH (ref 17–63)
AST: 138 U/L — AB (ref 15–41)
Albumin: 2 g/dL — ABNORMAL LOW (ref 3.5–5.0)
Alkaline Phosphatase: 94 U/L (ref 38–126)
Anion gap: 18 — ABNORMAL HIGH (ref 5–15)
BUN: 100 mg/dL — AB (ref 6–20)
CHLORIDE: 91 mmol/L — AB (ref 101–111)
CO2: 19 mmol/L — AB (ref 22–32)
Calcium: 8 mg/dL — ABNORMAL LOW (ref 8.9–10.3)
Creatinine, Ser: 10.49 mg/dL — ABNORMAL HIGH (ref 0.61–1.24)
GFR calc Af Amer: 5 mL/min — ABNORMAL LOW (ref >60)
GFR, EST NON AFRICAN AMERICAN: 4 mL/min — AB (ref >60)
Glucose, Bld: 282 mg/dL — ABNORMAL HIGH (ref 65–99)
POTASSIUM: 4.9 mmol/L (ref 3.5–5.1)
SODIUM: 128 mmol/L — AB (ref 135–145)
Total Bilirubin: 1.3 mg/dL — ABNORMAL HIGH (ref 0.3–1.2)
Total Protein: 4.8 g/dL — ABNORMAL LOW (ref 6.5–8.1)

## 2016-01-05 LAB — GLUCOSE, CAPILLARY
GLUCOSE-CAPILLARY: 234 mg/dL — AB (ref 65–99)
GLUCOSE-CAPILLARY: 251 mg/dL — AB (ref 65–99)
GLUCOSE-CAPILLARY: 119 mg/dL — AB (ref 65–99)
Glucose-Capillary: 164 mg/dL — ABNORMAL HIGH (ref 65–99)

## 2016-01-05 LAB — TYPE AND SCREEN
BLOOD PRODUCT EXPIRATION DATE: 201801032359
BLOOD PRODUCT EXPIRATION DATE: 201801032359
ISSUE DATE / TIME: 201712160954
Unit Type and Rh: 1700
Unit Type and Rh: 1700

## 2016-01-05 LAB — CBC
HEMATOCRIT: 24.6 % — AB (ref 39.0–52.0)
Hemoglobin: 8.4 g/dL — ABNORMAL LOW (ref 13.0–17.0)
MCH: 31 pg (ref 26.0–34.0)
MCHC: 34.1 g/dL (ref 30.0–36.0)
MCV: 90.8 fL (ref 78.0–100.0)
Platelets: 87 10*3/uL — ABNORMAL LOW (ref 150–400)
RBC: 2.71 MIL/uL — AB (ref 4.22–5.81)
RDW: 21.2 % — ABNORMAL HIGH (ref 11.5–15.5)
WBC: 15.9 10*3/uL — AB (ref 4.0–10.5)

## 2016-01-05 LAB — H. PYLORI ANTIBODY, IGG

## 2016-01-05 LAB — PROTIME-INR
INR: 1.31
Prothrombin Time: 16.4 seconds — ABNORMAL HIGH (ref 11.4–15.2)

## 2016-01-05 MED ORDER — ENSURE ENLIVE PO LIQD
237.0000 mL | Freq: Two times a day (BID) | ORAL | Status: DC
  Administered 2016-01-05: 237 mL via ORAL

## 2016-01-05 NOTE — Progress Notes (Signed)
Patient ID: [name], male   DOB: [birth year], 70 y.o.   MRN: [id number]   I with patient and his wife. At this moment patient is going to be comfort only. She told me she has discussed with Dr. Rosana Hoes in detail and at this moment they have agreed to call hospice. At this moment because of patient general poor condition it seems to be reasonable decision and will hold dialysis.

## 2016-01-05 NOTE — Clinical Social Work Note (Signed)
Pt's wife has made decision to transition pt to comfort care. Discussed residential hospice and wife is only interested in [facility]. Also discussed SNF with hospice and home with hospice. Referral sent. Will follow up in AM.   Benay Pike, [place]

## 2016-01-05 NOTE — Progress Notes (Signed)
REVIEWED. PT NOW COMFORT CARE.   Subjective: Only drank a strawberry ensure yesterday. Picking at breakfast food, doesn't want to eat. Poor appetite. Wants to go home. Confused. Family at bedside stating "he won't eat until he goes home". Dialysis unable to be completed yesterday as access could not be obtained. 2 black stools in past 24 hours per nursing. Patient denies abdominal pain. No N/V.   Objective: Vital signs in last 24 hours: Temp:  [97.5 F (36.4 C)-98.3 F (36.8 C)] 97.9 F (36.6 C) (12/19 0727) Pulse Rate:  [58-77] 58 (12/18 1200) Resp:  [10-25] 17 (12/19 0727) BP: (98-159)/(34-114) 124/47 (12/19 0700) SpO2:  [88 %-91 %] 88 % (12/18 1200) Weight:  [187 lb 2.7 oz (84.9 kg)] 187 lb 2.7 oz (84.9 kg) (12/19 0500) Last BM Date: 01/05/16 General:   Lethargic but awakens to verbal stimuli. Confused.  Abdomen:  Bowel sounds present, soft, non-tender, non-distended. Extremities:  Without edema, feet cold to touch, mottling of lower extremities  Neurologic:  Confused    Intake/Output from previous day: 12/18 0701 - 12/19 0700 In: 875 [I.V.:825; IV Piggyback:50] Out: -  Intake/Output this shift: No intake/output data recorded.  Lab Results:  Recent Labs  01/13/2016 0410 01/04/16 0422 01/05/16 0411  WBC 16.9* 15.0* 15.9*  HGB 9.4* 9.0* 8.4*  HCT 27.4* 26.3* 24.6*  PLT 82* 83* 87*   BMET  Recent Labs  01/08/2016 0410 01/04/16 0422 01/05/16 0411  NA 135 129* 128*  K 3.9 4.5 4.9  CL 97* 92* 91*  CO2 25 22 19*  GLUCOSE 239* 255* 282*  BUN 74* 88* 100*  CREATININE 7.63* 8.91* 10.49*  CALCIUM 8.6* 8.0* 8.0*   LFT  Recent Labs  01/09/2016 0410 01/04/16 0422 01/05/16 0411  PROT 4.4* 4.6* 4.8*  ALBUMIN 2.0* 2.0* 2.0*  AST 377* 212* 138*  ALT 1,124* 806* 620*  ALKPHOS 109 97 94  BILITOT 1.5* 1.3* 1.3*   PT/INR  Recent Labs  01/04/16 0422 01/05/16 0411  LABPROT 17.7* 16.4*  INR 1.44 1.31    Studies/Results: Ct Angio Ao+bifem W &/or Wo  Contrast  Result Date: 12/28/2015 CLINICAL DATA:  Bilateral leg pain extending to the feet and toes EXAM: CT ANGIOGRAPHY AOBIFEM WITHOUT AND WITH CONTRAST TECHNIQUE: Arterial phase postcontrast images of the abdominal aorta and both lower extremities were obtained. Multi planer reformatted images and maximum intensity projections were also provided. CONTRAST:  150 mL Isovue 370 IV COMPARISON:  None. FINDINGS: VASCULAR FINDINGS Aorta: There is atherosclerotic calcification within the abdominal aorta. No aneurysm, dissection or focal ulceration. Celiac axis: There is atherosclerotic calcification at the origin without high-grade stenosis. No dissection. Normal celiac branching pattern. Superior mesenteric artery: Atherosclerotic calcification at the origin without high-grade stenosis or dissection. Renal arteries: Single right and duplicated left renal arteries. There is atherosclerotic calcification at the origin of the right renal artery. Inferior mesenteric artery:  Patent. Common iliac arteries: Patent without aneurysm or stenosis. Multifocal atherosclerotic calcification. Internal iliac arteries: Mild atherosclerotic calcification but otherwise normal. External iliac arteries: There is proximal atherosclerotic calcification within both external iliac arteries, without associated stenosis. The deep circumflex and inferior epigastric arteries are patent. Runoff: There is normal opacification of both femoral arteries and popliteal arteries, without dissection or high-grade stenosis. There is diminished contrast-enhancement within the anterior tibial, posterior tibial and fibular arteries bilaterally, beginning at the level of the proximal tibial diaphysis. On the right, there is near complete loss of contrast opacification of the anterior tibial artery at the level of  the midtibia, while the fibular and posterior tibial arteries remain opacified. The appearance of the left anterior tibial artery is similar.  NONVASCULAR FINDINGS Lower chest: No pulmonary nodules. No visible pleural or pericardial effusion. Hepatobiliary: Left hepatic lobe cyst measures 3 cm. Gallbladder is surgically absent. Pancreas: Normal pancreatic contours and enhancement. No peripancreatic fluid collection or pancreatic ductal dilatation. Spleen: Visualized spleen is normal. Adrenals/Urinary Tract: Normal adrenal glands. Bilateral atrophic kidneys with multiple renal cysts. The largest cysts measures 17 mm. Stomach/Bowel: No abnormal bowel dilatation. No bowel wall thickening or adjacent fat stranding to indicate acute inflammation. No abdominal fluid collection. Normal appendix. Lymphatic:  No abdominal or pelvic adenopathy. Reproductive: Prostate calcifications. Normal seminal vesicles. No free fluid in the pelvis. Musculoskeletal: No lytic or blastic osseous lesion. Normal visualized extrathoracic and extraperitoneal soft tissues. Other: No contributory non-categorized findings. Review of the MIP images confirms the above findings. IMPRESSION: 1. Diminished opacification of the bilateral anterior tibial, posterior tibial and fibular arteries. Given the symmetric appearance, the findings may be secondary to an effect of contrast bolus timing. However, a degree of stenosis or occlusion would be difficult to exclude, particularly of the anterior tibial arteries, which show decreased opacification at a more proximal level than the posterior tibial and fibular arteries. Lower extremity vascular ultrasound may be helpful for further evaluation. 2. No occlusion or high grade stenosis of the femoral or popliteal arteries. 3. Aortic atherosclerosis. No dissection or high-grade stenosis of the major abdominal and pelvic arteries. 4. Bilateral renal atrophy. Electronically Signed   By: Ulyses Jarred M.D.   On: 01/16/2016 20:44    Assessment: 70 year old male admitted with acute diarrhea with Cdiff quick scan negative, hypoglycemia, elevated lactic  acid on admission, elevated transaminases likely secondary to ischemic hepatitis, acute on chronic anemia and heme positive stool s/p 1 unit PRBCs this admission. EGD 12/17 with non-bleeding esophageal ulcers, non-bleeding gastric ulcers, multiple non-bleeding duodenal ulcers secondary to  ischemic injury. CTA of aorta/bifem completed yesterday. Mesenteric vasculature with calcifications at SMA and celiac but without high-grade stenosis, IMA patent. Surgical consult obtained for bilateral lower extremity arterial insufficiency; family desiring hospice rather than bilateral BKA in light of poor health and prognosis per surgery notes. H.pylori serology negative, Hgb slightly down from yesterday, with 2 dark stools in past 24 hours. Likely old blood and not evidence of acute bleeding. Unable to complete dialysis yesterday due to lack of access, and per notes by Dr. Lowanda Foster, patient is going to be comfort care and dialysis will be held.  KOH prep with yeast: started Diflucan on 01/04/16 for a 21 days course; however, patient is now on comfort measures.      Plan: Appears family moving towards comfort measures Poor appetite but enjoys boost: will make sure this is ordered to have as desired Will sign off  Annitta Needs, ANP-BC [facility]      LOS: 6 days    01/05/2016, 7:53 AM

## 2016-01-05 NOTE — Progress Notes (Signed)
PROGRESS NOTE    [name]  [id number] DOB: 08/05/1945 DOA: 12/26/2015 PCP: Odette Fraction, MD     Brief Narrative:  70 y/o man admitted to the hospital on 12/13 who was initially admitted with AMS and severe dehydration from a GI illness as well as profound hypoglycemia. He has developed bilateral critical limb ischemia and it has been decided to transition over to comfort care and he is currently awaiting placement and residential hospice (I have been informed by SW that there is a high likelihood of bed being available 12/20.   Assessment & Plan:   Principal Problem:   Sepsis ([place]) Active Problems:   TOBACCO ABUSE   Essential hypertension   Aortic valve disease   COPD (chronic obstructive pulmonary disease) (HCC)   Chronic systolic congestive heart failure (HCC)   Cardiomyopathy (HCC)   Nonischemic cardiomyopathy (HCC)   Type II diabetes mellitus with nephropathy ([place])   ICD (implantable cardioverter-defibrillator), single, in situ; St Jude   ESRD (end stage renal disease) ([place])   Diarrhea of presumed infectious origin   Hypoglycemia   Candida esophagitis (HCC)   Sepsis -With severe hypotension and hypoperfusion and shock liver. -I believe more than sepsis this was likely hypotension due to severe GI illness from a presumed viral gastroenteritis. -Source was thought to be urine, is now off antibiotics as of 12/18.  Upper GI Bleed -Seen by GI and EGD performed with multiple GI ulcers presumed due to ischemic injury. -Plan to continue PPI for now.  Critical Limb Ischemia, bilateral -Seen by surgery. Only viable option would be to perform bilateral BKAs. Patient would not be a good surgical candidate and wife is against this. -After discussion, plan has been made to transition him to hospice. They would prefer residential hospice and there is the possibility of bed being available on 12/20.  Anemia of chronic disease along with likely occult GI blood loss  related anemia. This was upper GI bleed due to multiple ischemic ulcers found on EGD on 01/10/2016, he received 1 unit of PRBC transfused on 01/02/2016, he is on BID PPI; GI following,   Chronic combined systolic and diastolic CHF EF [id number] with AICD history of nonischemic myopathy + AS. Likely contribution to ischemic insult when he got dehydrated from diarrhea.  Hypoglycemia due to combination of #1 above, diarrhea & poor oral intake. Sugars are labile but he tends to get hypoglycemic very easily, his oral intake has improved with stop D5W drip and monitor on low-dose sliding scale before every meal . Stable TSH & random cortisol.  ESRD -Unable to access graft for HD. -Will be transitioning to hospice and decision has been made to DC dialysis.   DVT prophylaxis: None (bilateral critical limb ischemia and recent GI bleed; also transitioning to hospice care) Code Status: DNR Family Communication: Discussed with wife at bedside Disposition Plan: residential hospice when bed available  Consultants:   GI  Surgery    Antimicrobials:  Anti-infectives    Start     Dose/Rate Route Frequency Ordered Stop   01/05/16 1000  fluconazole (DIFLUCAN) tablet 100 mg     100 mg Oral Daily 01/04/16 1042 01/25/16 0959   01/04/16 1100  fluconazole (DIFLUCAN) tablet 200 mg     200 mg Oral  Once 01/04/16 1041 01/04/16 1154   12/23/2015 2200  piperacillin-tazobactam (ZOSYN) IVPB 3.375 g  Status:  Discontinued     3.375 g 12.5 mL/hr over 240 Minutes Intravenous Every 12 hours 01/13/2016 1043 01/04/16 1009  12/20/2015 1800  vancomycin (VANCOCIN) 50 mg/mL oral solution 500 mg  Status:  Discontinued    Comments:  Stool study pending pretty   500 mg Oral Every 6 hours 01/13/2016 1515 12/22/2015 1654   12/19/2015 1045  piperacillin-tazobactam (ZOSYN) IVPB 3.375 g     3.375 g 100 mL/hr over 30 Minutes Intravenous STAT 12/20/2015 1042 12/28/2015 1155   12/26/2015 1045  vancomycin (VANCOCIN) IVPB 1000 mg/200 mL premix      1,000 mg 200 mL/hr over 60 Minutes Intravenous  Once 12/20/2015 1042 12/27/2015 1231       Subjective: Confused, not able to answer questions  Objective: Vitals:   01/05/16 0900 01/05/16 1000 01/05/16 1100 01/05/16 1126  BP: (!) 117/47 101/83 (!) 124/46   Pulse:      Resp: (!) 23 19 17 11   Temp:    97.8 F (36.6 C)  TempSrc:    Axillary  SpO2:      Weight:      Height:        Intake/Output Summary (Last 24 hours) at 01/05/16 1338 Last data filed at 01/05/16 1200  Gross per 24 hour  Intake              960 ml  Output                0 ml  Net              960 ml   Filed Weights   01/10/2016 0500 01/04/16 0600 01/05/16 0500  Weight: 84.1 kg (185 lb 6.5 oz) 84.2 kg (185 lb 10 oz) 84.9 kg (187 lb 2.7 oz)    Examination:  General exam: Confused Respiratory system: Clear to auscultation.  Cardiovascular system:RRR. No murmurs, rubs, gallops. Gastrointestinal system: Abdomen is nondistended, soft and nontender. No organomegaly or masses felt. Normal bowel sounds heard. Central nervous system: Unable to fully assess given current mental state Extremities: BLE wrapped, very cold to touch Skin: No rashes, lesions or ulcers     Data Reviewed: I have personally reviewed following labs and imaging studies  CBC:  Recent Labs Lab 01/07/2016 1013  01/01/16 0401  01/02/16 0441 01/02/16 1446 01/16/2016 0410 01/04/16 0422 01/05/16 0411  WBC 22.5*  < > 26.3*  --  18.3*  --  16.9* 15.0* 15.9*  NEUTROABS 20.6*  --   --   --   --   --   --   --   --   HGB 10.1*  < > 9.0*  < > 7.5* 9.7* 9.4* 9.0* 8.4*  HCT 31.1*  < > 28.3*  < > 22.7* 28.1* 27.4* 26.3* 24.6*  MCV 97.8  < > 95.6  --  92.7  --  89.5 90.1 90.8  PLT 223  < > 174  --  96*  --  82* 83* 87*  < > = values in this interval not displayed. Basic Metabolic Panel:  Recent Labs Lab 01/01/16 0401 01/01/16 0855 01/02/16 0441 01/13/2016 0410 01/04/16 0422 01/05/16 0411  NA 134* 132* 126* 135 129* 128*  K 6.1* 6.3* 3.8 3.9 4.5  4.9  CL 92* 91* 90* 97* 92* 91*  CO2 13* 12* 23 25 22  19*  GLUCOSE 115* 147* 479* 239* 255* 282*  BUN 78* 84* 55* 74* 88* 100*  CREATININE 8.34* 8.80* 5.59* 7.63* 8.91* 10.49*  CALCIUM 10.0 9.8 8.2* 8.6* 8.0* 8.0*  MG  --   --  2.0  --   --   --  PHOS 10.4*  --   --  5.7*  --   --    GFR: Estimated Creatinine Clearance (by C-G formula based on SCr of 10.49 mg/dL (H)) Male: 5.6 mL/min Male: 6.9 mL/min Liver Function Tests:  Recent Labs Lab 01/01/16 0855 01/02/16 0441 01/10/2016 0410 01/04/16 0422 01/05/16 0411  AST 1,185* 819* 377* 212* 138*  ALT 2,002* 1,461* 1,124* 806* 620*  ALKPHOS 112 94 109 97 94  BILITOT 1.4* 1.6* 1.5* 1.3* 1.3*  PROT 5.2* 4.2* 4.4* 4.6* 4.8*  ALBUMIN 2.4* 1.9* 2.0* 2.0* 2.0*   No results for input(s): LIPASE, AMYLASE in the last 168 hours.  Recent Labs Lab 01/01/16 1751  AMMONIA 24   Coagulation Profile:  Recent Labs Lab 12/31/15 1152 01/01/16 0401 01/02/16 0441 01/04/16 0422 01/05/16 0411  INR 2.14 2.47 2.28 1.44 1.31   Cardiac Enzymes: No results for input(s): CKTOTAL, CKMB, CKMBINDEX, TROPONINI in the last 168 hours. BNP (last 3 results) No results for input(s): PROBNP in the last 8760 hours. HbA1C: No results for input(s): HGBA1C in the last 72 hours. CBG:  Recent Labs Lab 01/04/16 1200 01/04/16 1647 01/04/16 2110 01/05/16 0718 01/05/16 1124  GLUCAP 294* 255* 292* 234* 251*   Lipid Profile: No results for input(s): CHOL, HDL, LDLCALC, TRIG, CHOLHDL, LDLDIRECT in the last 72 hours. Thyroid Function Tests: No results for input(s): TSH, T4TOTAL, FREET4, T3FREE, THYROIDAB in the last 72 hours. Anemia Panel: No results for input(s): VITAMINB12, FOLATE, FERRITIN, TIBC, IRON, RETICCTPCT in the last 72 hours. Urine analysis:    Component Value Date/Time   COLORURINE YELLOW 03/29/2015 1649   APPEARANCEUR CLEAR 03/29/2015 1649   LABSPEC 1.010 03/29/2015 1649   PHURINE 5.5 03/29/2015 1649   GLUCOSEU NEGATIVE 03/29/2015  1649   HGBUR NEGATIVE 03/29/2015 1649   BILIRUBINUR NEGATIVE 03/29/2015 1649   KETONESUR NEGATIVE 03/29/2015 1649   PROTEINUR 100 (A) 03/29/2015 1649   UROBILINOGEN 0.2 05/20/2014 1146   NITRITE NEGATIVE 03/29/2015 1649   LEUKOCYTESUR NEGATIVE 03/29/2015 1649   Sepsis Labs: @LABRCNTIP (procalcitonin:4,lacticidven:4)  ) Recent Results (from the past 240 hour(s))  Blood Culture (routine x 2)     Status: None   Collection Time: 12/27/2015 10:13 AM  Result Value Ref Range Status   Specimen Description BLOOD LEFT ARM  Final   Special Requests BOTTLES DRAWN AEROBIC AND ANAEROBIC 5 CC EACH  Final   Culture NO GROWTH 5 DAYS  Final   Report Status 01/04/2016 FINAL  Final  Blood Culture (routine x 2)     Status: None   Collection Time: 01/09/2016 10:13 AM  Result Value Ref Range Status   Specimen Description BLOOD LEFT ARM  Final   Special Requests BOTTLES DRAWN AEROBIC AND ANAEROBIC 7 CC EACH  Final   Culture NO GROWTH 5 DAYS  Final   Report Status 01/04/2016 FINAL  Final  C difficile quick scan w PCR reflex     Status: None   Collection Time: 01/13/2016  1:56 PM  Result Value Ref Range Status   C Diff antigen NEGATIVE NEGATIVE Final   C Diff toxin NEGATIVE NEGATIVE Final   C Diff interpretation No C. difficile detected.  Final  MRSA PCR Screening     Status: None   Collection Time: 01/12/2016  2:21 PM  Result Value Ref Range Status   MRSA by PCR NEGATIVE NEGATIVE Final    Comment:        The GeneXpert MRSA Assay (FDA approved for NASAL specimens only), is one component of a comprehensive  MRSA colonization surveillance program. It is not intended to diagnose MRSA infection nor to guide or monitor treatment for MRSA infections.   Gastrointestinal Panel by PCR , Stool     Status: None   Collection Time: 12/31/15  8:22 AM  Result Value Ref Range Status   Campylobacter species NOT DETECTED NOT DETECTED Final   Plesimonas shigelloides NOT DETECTED NOT DETECTED Final   Salmonella  species NOT DETECTED NOT DETECTED Final   Yersinia enterocolitica NOT DETECTED NOT DETECTED Final   Vibrio species NOT DETECTED NOT DETECTED Final   Vibrio cholerae NOT DETECTED NOT DETECTED Final   Enteroaggregative E coli (EAEC) NOT DETECTED NOT DETECTED Final   Enteropathogenic E coli (EPEC) NOT DETECTED NOT DETECTED Final   Enterotoxigenic E coli (ETEC) NOT DETECTED NOT DETECTED Final   Shiga like toxin producing E coli (STEC) NOT DETECTED NOT DETECTED Final   Shigella/Enteroinvasive E coli (EIEC) NOT DETECTED NOT DETECTED Final   Cryptosporidium NOT DETECTED NOT DETECTED Final   Cyclospora cayetanensis NOT DETECTED NOT DETECTED Final   Entamoeba histolytica NOT DETECTED NOT DETECTED Final   Giardia lamblia NOT DETECTED NOT DETECTED Final   Adenovirus F40/41 NOT DETECTED NOT DETECTED Final   Astrovirus NOT DETECTED NOT DETECTED Final   Norovirus GI/GII NOT DETECTED NOT DETECTED Final   Rotavirus A NOT DETECTED NOT DETECTED Final   Sapovirus (I, II, IV, and V) NOT DETECTED NOT DETECTED Final  KOH prep     Status: None   Collection Time: 01/08/2016  9:28 AM  Result Value Ref Range Status   Specimen Description ESOPHAGUS  Final   Special Requests NONE  Final   KOH Prep FEW YEAST WITH PSEUDOHYPHAE  Final   Report Status 01/10/2016 FINAL  Final         Radiology Studies: Ct Angio Ao+bifem W &/or Wo Contrast  Result Date: 01/04/2016 CLINICAL DATA:  Bilateral leg pain extending to the feet and toes EXAM: CT ANGIOGRAPHY AOBIFEM WITHOUT AND WITH CONTRAST TECHNIQUE: Arterial phase postcontrast images of the abdominal aorta and both lower extremities were obtained. Multi planer reformatted images and maximum intensity projections were also provided. CONTRAST:  150 mL Isovue 370 IV COMPARISON:  None. FINDINGS: VASCULAR FINDINGS Aorta: There is atherosclerotic calcification within the abdominal aorta. No aneurysm, dissection or focal ulceration. Celiac axis: There is atherosclerotic  calcification at the origin without high-grade stenosis. No dissection. Normal celiac branching pattern. Superior mesenteric artery: Atherosclerotic calcification at the origin without high-grade stenosis or dissection. Renal arteries: Single right and duplicated left renal arteries. There is atherosclerotic calcification at the origin of the right renal artery. Inferior mesenteric artery:  Patent. Common iliac arteries: Patent without aneurysm or stenosis. Multifocal atherosclerotic calcification. Internal iliac arteries: Mild atherosclerotic calcification but otherwise normal. External iliac arteries: There is proximal atherosclerotic calcification within both external iliac arteries, without associated stenosis. The deep circumflex and inferior epigastric arteries are patent. Runoff: There is normal opacification of both femoral arteries and popliteal arteries, without dissection or high-grade stenosis. There is diminished contrast-enhancement within the anterior tibial, posterior tibial and fibular arteries bilaterally, beginning at the level of the proximal tibial diaphysis. On the right, there is near complete loss of contrast opacification of the anterior tibial artery at the level of the midtibia, while the fibular and posterior tibial arteries remain opacified. The appearance of the left anterior tibial artery is similar. NONVASCULAR FINDINGS Lower chest: No pulmonary nodules. No visible pleural or pericardial effusion. Hepatobiliary: Left hepatic lobe cyst measures 3 cm.  Gallbladder is surgically absent. Pancreas: Normal pancreatic contours and enhancement. No peripancreatic fluid collection or pancreatic ductal dilatation. Spleen: Visualized spleen is normal. Adrenals/Urinary Tract: Normal adrenal glands. Bilateral atrophic kidneys with multiple renal cysts. The largest cysts measures 17 mm. Stomach/Bowel: No abnormal bowel dilatation. No bowel wall thickening or adjacent fat stranding to indicate acute  inflammation. No abdominal fluid collection. Normal appendix. Lymphatic:  No abdominal or pelvic adenopathy. Reproductive: Prostate calcifications. Normal seminal vesicles. No free fluid in the pelvis. Musculoskeletal: No lytic or blastic osseous lesion. Normal visualized extrathoracic and extraperitoneal soft tissues. Other: No contributory non-categorized findings. Review of the MIP images confirms the above findings. IMPRESSION: 1. Diminished opacification of the bilateral anterior tibial, posterior tibial and fibular arteries. Given the symmetric appearance, the findings may be secondary to an effect of contrast bolus timing. However, a degree of stenosis or occlusion would be difficult to exclude, particularly of the anterior tibial arteries, which show decreased opacification at a more proximal level than the posterior tibial and fibular arteries. Lower extremity vascular ultrasound may be helpful for further evaluation. 2. No occlusion or high grade stenosis of the femoral or popliteal arteries. 3. Aortic atherosclerosis. No dissection or high-grade stenosis of the major abdominal and pelvic arteries. 4. Bilateral renal atrophy. Electronically Signed   By: Ulyses Jarred M.D.   On: 01/16/2016 20:44        Scheduled Meds: . aspirin  81 mg Oral Daily  . calcium acetate  667 mg Oral Q lunch  . epoetin (EPOGEN/PROCRIT) injection  4,000 Units Intravenous Q M,W,F-HD  . feeding supplement (ENSURE ENLIVE)  237 mL Oral BID BM  . feeding supplement (NEPRO CARB STEADY)  237 mL Oral Q24H  . fluconazole  100 mg Oral Daily  . insulin aspart  0-9 Units Subcutaneous TID WC  . lidocaine-prilocaine   Topical Once  . mouth rinse  15 mL Mouth Rinse BID  . metoprolol succinate  50 mg Oral Daily  . nicotine  21 mg Transdermal Daily  . pantoprazole  40 mg Oral BID AC   Continuous Infusions:   LOS: 6 days    Time spent: 25 minutes. Greater than 50% of this time was spent in direct contact with the patient  coordinating care.     Lelon Frohlich, MD [facility] Pager [phone]  If 7PM-7AM, please contact night-coverage [email or web address] Password [email or web address] 01/05/2016, 1:38 PM

## 2016-01-05 NOTE — Care Management Note (Signed)
Case Management Note  Patient Details  Name: [name] MRN: [id number] Date of Birth: 01-03-1946  Subjective/Objective:                  Pt admitted cardiogenic shock. Pt is from home with wife. Pt/family have made the decision to transition to comfort care and DC to hospice medical facility. CSW is aware and will make arrangements for return to facility.   Action/Plan: NO CM needs anticipated.   Expected Discharge Date:     Jan 22, 2016             Expected Discharge Plan:  [place]  In-House Referral:  Clinical Social Work  Discharge planning Services  CM Consult  Post Acute Care Choice:  NA Choice offered to:  NA  Status of Service:  Completed, signed off Sherald Barge, RN 01/05/2016, 3:01 PM

## 2016-01-05 NOTE — Clinical Social Work Note (Signed)
Patient Information   Patient Name [name], [name] ([id number]) Sex Male DOB 12-May-1945  Room Bed  [place] IC09-01  Patient Demographics   Address [place] [address] Phone [phone] (Home) [phone] (Mobile)  Patient Ethnicity & Race   Ethnic Group Patient Race  Not Hispanic or Latino Black or African American  Emergency Contact(s)   Name Relation Home Work Mobile  Hundal,Wanda Spouse [phone]  [phone]  Documents on File    Status Date Received Description  Documents for the Patient  EMR Medication Summary Not Received    EMR Problem Summary Not Received    EMR Immunization Summary Not Received    EMR Patient Summary Not Received    [place] Received 03/17/10   [place] E-Signature HIPAA Notice of Privacy Received 06/21/10   [place] E-Signature HIPAA Notice of Privacy Spanish Not Received    Driver's License Not Received    Advance Directives/Living Will/HCPOA/POA Not Received    Driver's License Not Received    Historic Radiology Documentation Not Received    [place] Received 09/03/12   Historic Radiology Documentation Not Received    Historic Radiology Documentation Not Received    Insurance Card Received 05/18/10   Insurance Card Not Received    AMB Correspondence Not Received  Neph 04/12 [facility]  AMB Correspondence Not Received  Report 05/12 [employer]   AMB Correspondence Not Received  Office Note 06/12 Bud Face MD   AMB Correspondence Not Received  Office Note 07/12 Bud Face  Financial Application Not Received    [place] HIPAA NOTICE OF PRIVACY - Scanned Received 08/27/10 [facility]  [place] HIPAA NOTICE OF PRIVACY - Scanned Not Received    [place] HIPAA NOTICE OF PRIVACY - Scanned Not Received    Insurance Card Received 08/27/10 [facility]  Insurance Card Not Received    AMB Correspondence Not Received  09/12 Letter [employer]   Insurance  Card Not Received    AMB HH/NH/Hospice Not Received  01/13 Office note Bud Face MD   AMB Correspondence Not Received  01/13 Neph [facility]  AMB Correspondence Not Received  02/13 Rhu Truslow,M.D. W.  AMB Correspondence Not Received  03/13 Cruz Condon MD, W  AMB Correspondence Not Received  04/13 [place] Card Not Received    AMB Correspondence Not Received  10/12 Office Note Bud Face MD  AMB Correspondence Not Received  04/13 [facility]  Insurance Card Not Received    AMB Correspondence Not Received  05/13 Neph [facility]  AMB Correspondence Not Received  06/13 [facility]ph [facility]  AMB Correspondence Not Received  05/13 Office Note Nida MD, G  [employer] HIPAA Notice of Privacy - Scanned Not Received    [employer] Notice of Privacy - E Signature Not Received    Other Not Received    AMB Correspondence Not Received  08/13 Neph [facility]  [facility] Not Received    [facility] Not Received    AMB Correspondence Not Received  10/13 [place] Card Not Received    Insurance Card Received 02/01/12   Insurance Card Not Received    AMB Correspondence Not Received  02/14 Rufina Falco MD, M  AMB Correspondence Not Received  02/14 letter [facility]  [facility] Policy for Pain - E Signature Not Received    Insurance  Card Not Received    Insurance Card Not Received    AMB Correspondence Not Received  08/14 office note Nida MD, G  AMB Correspondence Not Received  08/14 clinical note Nida MD, G  AMB Correspondence Not Received  07/14 referral Mercy Moore MD, M  AMB Correspondence Not Received  12/14 clinical note Nida MD, G  AMB Correspondence Not Received  12/14 office note Nida MD, G  Insurance Card Received 02/11/13 A[employer] Medicare Complete  Insurance Card Not Received    Advanced Beneficiary Notice (ABN) Not Received    AMB Intake Forms/Questionnaires  04/05/13   AMB  Correspondence  06/20/13 OFFICE NOTE [facility]  AMB Correspondence  08/22/13 OFFICE NOTE MATTINGLY MD, MICHAEL  E-Signature AOB Spanish Not Received    AMB Correspondence  08/14/13 OFFICE NOTES [facility] [facility]  AMB Correspondence  10/03/13 OFFICE NOTES [facility]  AMB Correspondence  11/08/13 OFFICENOTE MATTINGLY MD, M  HIM ROI Authorization  11/28/13 ECS representing [employer]/ [employer]  AMB Correspondence  11/13/13 AUTHORIZATION FOR MEDICATION REVIEW [name]  AMB Correspondence  11/20/13 OFFICE NOTE [facility]  AMB Correspondence  12/11/13 VISIT NOTE  Insurance Card Received 01/22/14 AARP Medicare Complete  AMB Correspondence  09/03/13 OFFICE NOTE [facility]  Insurance Card Received 11/17/14 [employer]  AMB Correspondence  03/31/14 OFFICE NOTES  Other Photo ID Not Received    AMB Correspondence  04/03/14 OFFICE NOTE [facility]  AMB Correspondence  06/05/14 OFFICE NOTE [facility]  Release of Information  11/23/14 AUTH MEDICATION REVIEW [name]  AMB Correspondence  02/10/15 OFFICE NOTE [facility]  Insurance Card Received 03/05/15 [facility] medicare  [place] E-Signature HIPAA Notice of Privacy Received 03/08/15   AMB Correspondence  03/27/15 OFFICENOTE [facility]  AMB Correspondence  03/30/15 [facility] REFERRAL MATTINGLY MD, M  AMB Correspondence  03/16/15 OFFICE NOTE  [facility]  HIM ROI Authorization (Expired) 06/12/15 Authorization for batch [employer] Medicare Risk Adjustment fbg  06/12/15  AMB Correspondence  10/20/15 LETTER HAINES JR MD, C  AMB Correspondence  10/29/15 AUTHORIZATION FOR MEDICATION REVIEW  AMB Correspondence  11/24/15 AUTHORIZATION FOR MEDICATION REVIEW [name]  AMB Correspondence (Deleted) 11/09/10 10/12 Office Note Nida, G MD  AMB Correspondence (Deleted) 06/04/11 05/13 Office Note Nida MD,N  AMB  Correspondence (Deleted) 03/31/14 MEDICATION HISTORY  AMB Correspondence (Deleted) 11/23/14 AUTH MEDICATION REVIEW [name]  Documents for the Encounter  AOB (Assignment of Insurance Benefits) Not Received    E-signature AOB Signed 01/05/2016   MEDICARE RIGHTS Not Received    E-signature Medicare Rights Signed 01/17/2016   ED Patient Billing Extract   ED PB Summary  ED Patient Billing Extract   ED Encounter Summary  ED Discharge Signed 12/28/2015   Cardiac Monitoring Strip Shift Summary  12/21/2015   Cardiac Monitoring Strip  01/14/2016   ED Patient Billing Extract   ED PB Summary  Cardiac Monitoring Strip - Scanned  01/05/16   EKG  12/31/15   Ultrasound (Deleted) 12/31/15   Ultrasound (Deleted) 12/31/15   Upper Endoscopy Electronic  01/12/2016   Admission Information   Attending Provider Admitting Provider Admission Type Admission Date/Time  Estela Leonie Green, MD Thurnell Lose, MD Emergency 01/05/2016 [phone]  Discharge Date Hospital Service Auth/Cert Status Service Area   Internal Medicine Incomplete [place]  Unit Room/Bed Admission Status   AP-ICCUP NURSING IC09/IC09-01 Admission (Confirmed)   Admission   Complaint  hyperglycemia, GI [place] [facility] Account  Name Acct ID Class Status Primary Coverage  Amey, Olden [id number] Inpatient Open [place]      Guarantor Account (for Hospital Account [id number])   Name Relation to [place]? Acct Type  Sherril Croon Self CHSA Yes Personal/Family  Address Phone    [address] [place], [place] [address] [phone])        Coverage Information (for Hospital Account [id number])   F/O Payor/Plan Precert #  [facility] #  Ashad, Imel [id number]  [place] [address] [phone]

## 2016-01-05 NOTE — Progress Notes (Signed)
Inpatient Diabetes Program Recommendations  AACE/ADA: New Consensus Statement on Inpatient Glycemic Control (2015)  Target Ranges:  Prepandial:   less than 140 mg/dL      Peak postprandial:   less than 180 mg/dL (1-2 hours)      Critically ill patients:  140 - 180 mg/dL  Results for BYRANT, CHAIRS (MRN [id number]) as of 01/05/2016 07:31  Ref. Range 01/04/2016 08:23 01/04/2016 12:00 01/04/2016 16:47 01/04/2016 21:10 01/05/2016 07:18  Glucose-Capillary Latest Ref Range: 65 - 99 mg/dL 251 (H) 294 (H) 255 (H) 292 (H) 234 (H)   Review of Glycemic Control  Current orders for Inpatient glycemic control: Novolog 0-9 units TID with meals, Novolog 0-5 units QHS  Inpatient Diabetes Program Recommendations: Insulin - Basal: Glucose ranged from 243-294 mg/dl on 01/04/16 and fasting glucose is 234 mg/dl today. May want to consider ordering Levemir 5 units Q24H.  Thanks, Barnie Alderman, RN, MSN, CDE Diabetes Coordinator Inpatient Diabetes Program [phone] (Team Pager from 8am to 5pm)

## 2016-01-06 ENCOUNTER — Encounter (HOSPITAL_COMMUNITY): Payer: Self-pay | Admitting: Internal Medicine

## 2016-01-06 LAB — GLUCOSE, CAPILLARY: GLUCOSE-CAPILLARY: 149 mg/dL — AB (ref 65–99)

## 2016-01-18 NOTE — Progress Notes (Signed)
Nurse called to patient room.  Patient had expired.  [name] expiration had been verified by two nurses.  Wife present at this time. MD notified.

## 2016-01-18 NOTE — Progress Notes (Signed)
Patient oxygen saturation reading 81% on room air.  2liters of oxygen applied via nasal cannula, and oxygen increased to 92%.

## 2016-01-18 NOTE — Plan of Care (Signed)
Problem: Health Behavior/Discharge Planning: Goal: Ability to manage health-related needs will improve Outcome: Not Progressing Patient to be discharged with hospice

## 2016-01-18 NOTE — Plan of Care (Signed)
Problem: Activity: Goal: Risk for activity intolerance will decrease Outcome: Not Progressing Low activity tolerance, pt.to be discharged with hospice.

## 2016-01-18 DEATH — deceased

## 2016-02-18 NOTE — Discharge Summary (Addendum)
DEATH SUMMARY  Patient was a 71 year old man initially admitted to the hospital on [id number] with metabolic encephalopathy presumably due to sepsis and dehydration and severe dehydration from GI illness as well as profound hypoglycemia. He developed bilateral critical limb ischemia and was transitioned to comfort care at family request as he would be a poor surgical candidate. Decision was made to transfer to residential hospice, however he expired before this could be accomplished on 12/20 at 7:28 AM.  Probable causes of death -Sepsis -Bilateral critical limb ischemia -Upper GI bleed -Profound hypoglycemia  Domingo Mend, MD [facility] Pager: [phone]

## 2017-02-18 IMAGING — CT CT ABD-PELV W/O CM
2 of 4 series · 16 of 46 positions shown, 18 images · non-contrast
Comparison: Renal ultrasound 05/21/2014

CLINICAL DATA: Right upper and right lower quadrant pain. Pain
radiates to the flank and back.

EXAM:
CT ABDOMEN AND PELVIS WITHOUT CONTRAST
TECHNIQUE: Multidetector CT imaging of the abdomen and pelvis was performed
following the standard protocol without IV contrast.

[Series 2: standard/full over (age)lbs 5.0 · axial · 0.83mm/px · z∈[-490,-84]mm · 13 of 89 slices shown, 15 images]
[im 4/89  soft-tissue]
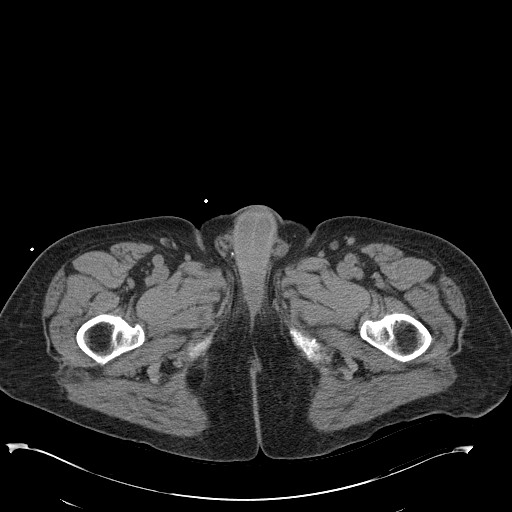
[im 4/89  bone]
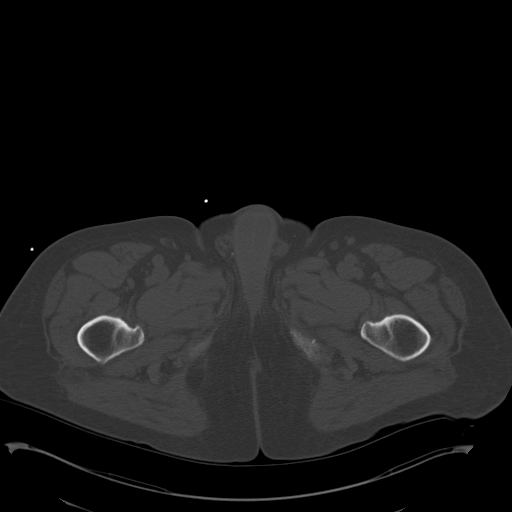
[im 11/89  soft-tissue]
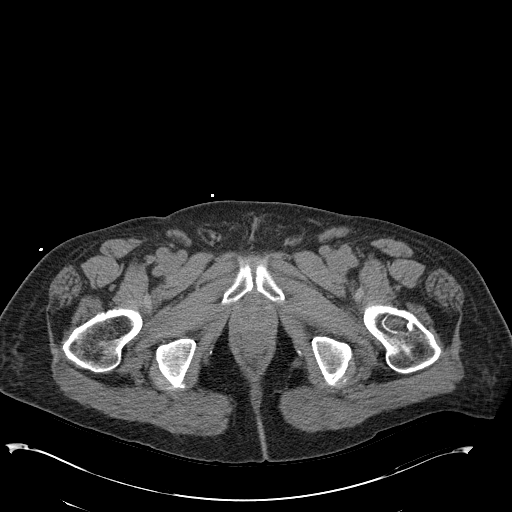
[im 18/89  soft-tissue]
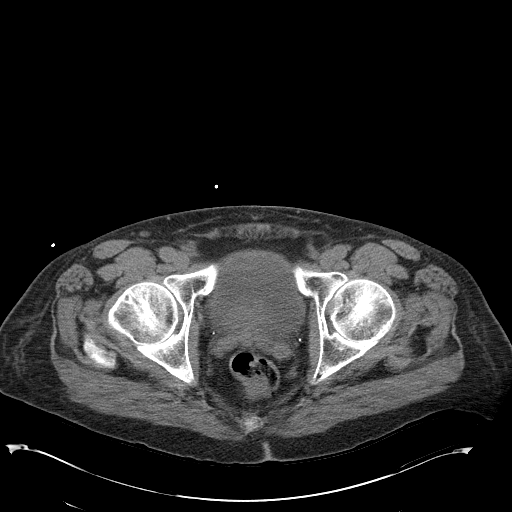
[im 25/89  soft-tissue]
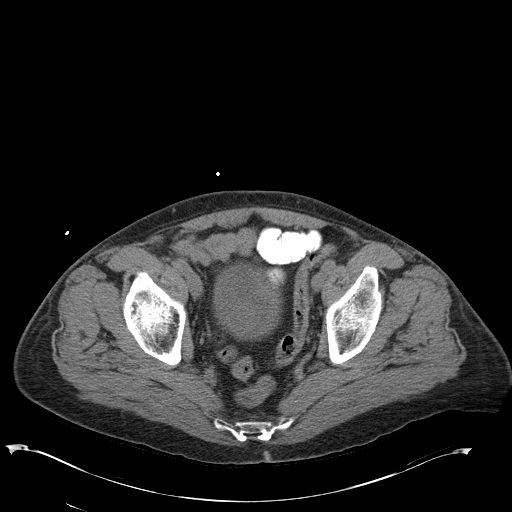
[im 32/89  soft-tissue]
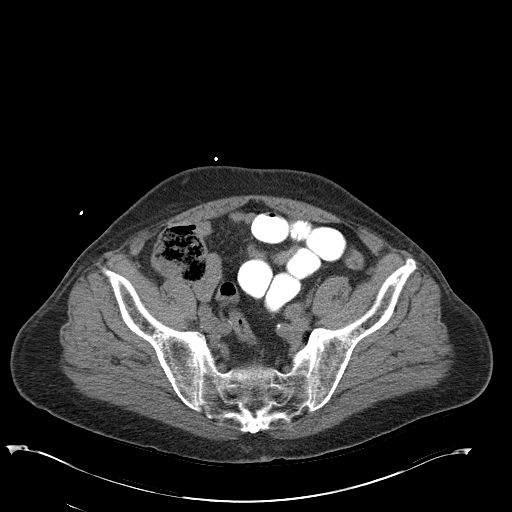
[im 39/89  soft-tissue]
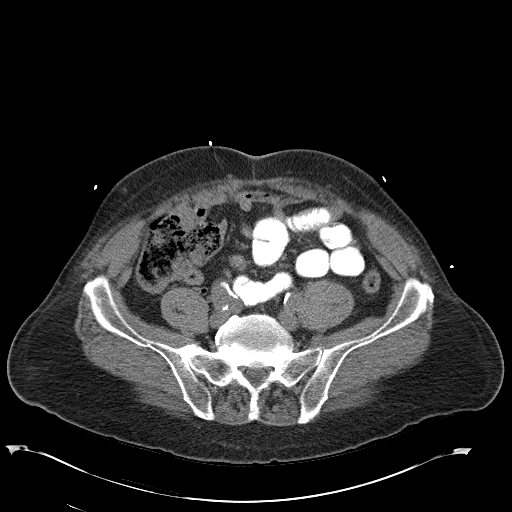
[im 46/89  soft-tissue]
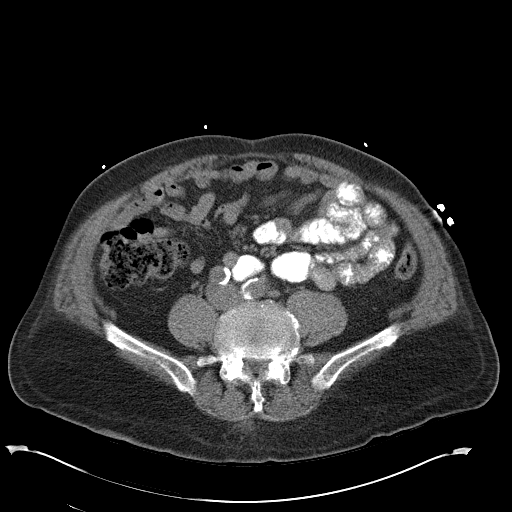
[im 50/89  soft-tissue]
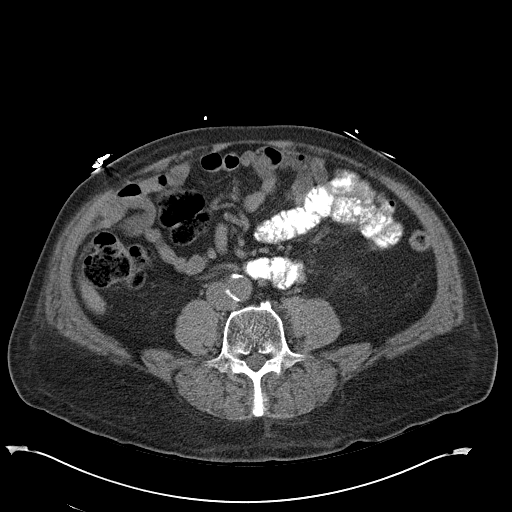
[im 57/89  soft-tissue]
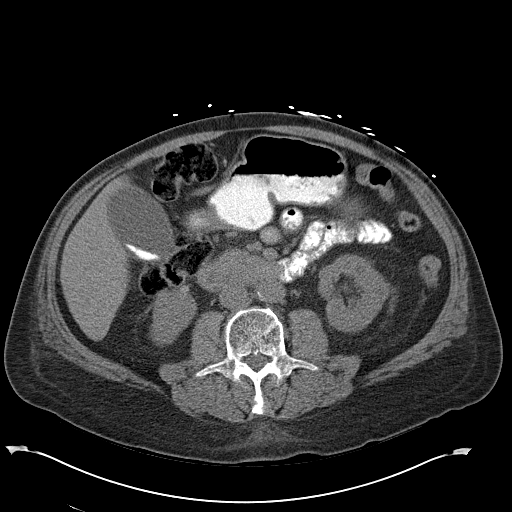
[im 57/89  bone]
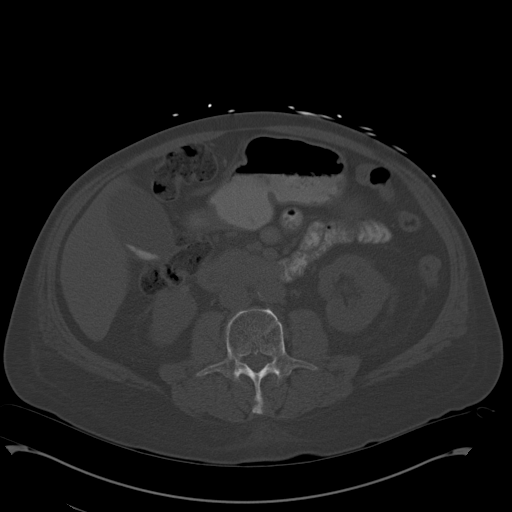
[im 64/89  soft-tissue]
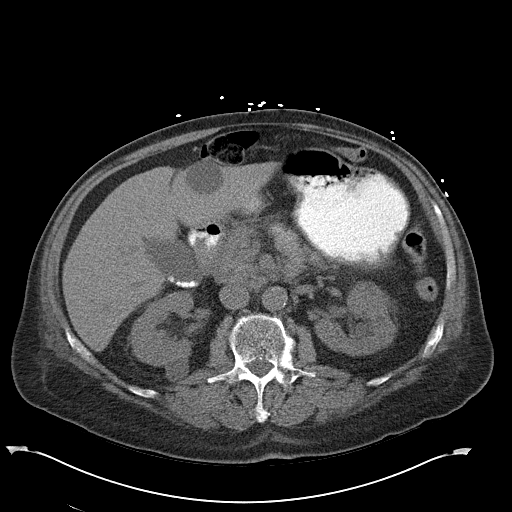
[im 71/89  soft-tissue]
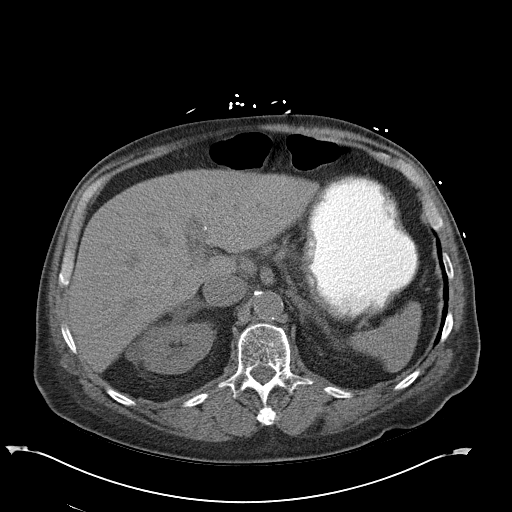
[im 78/89  soft-tissue]
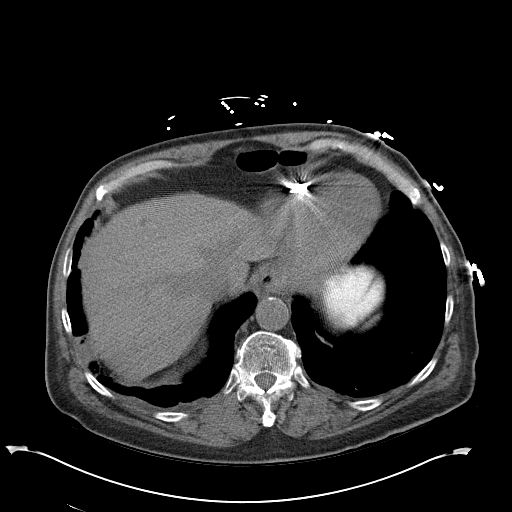
[im 85/89  soft-tissue]
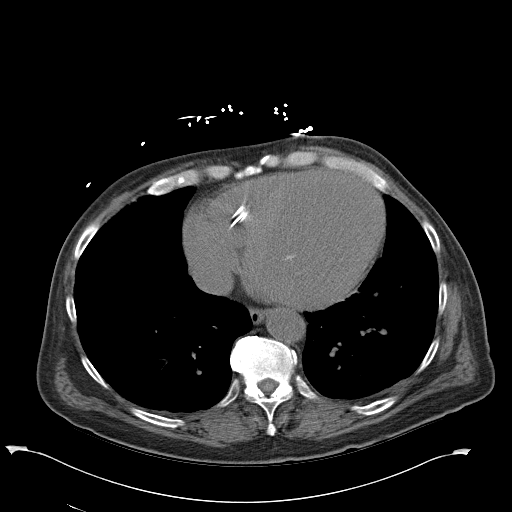

[Series 3: mpr coronal · coronal · 0.83mm/px · 3 of 102 slices shown]
[im 34/102  soft-tissue]
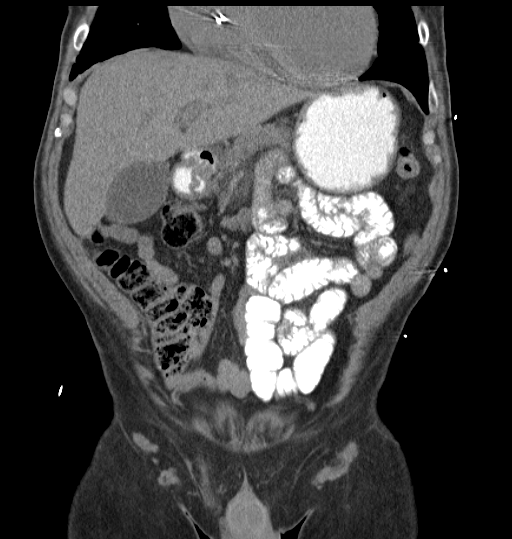
[im 45/102  soft-tissue]
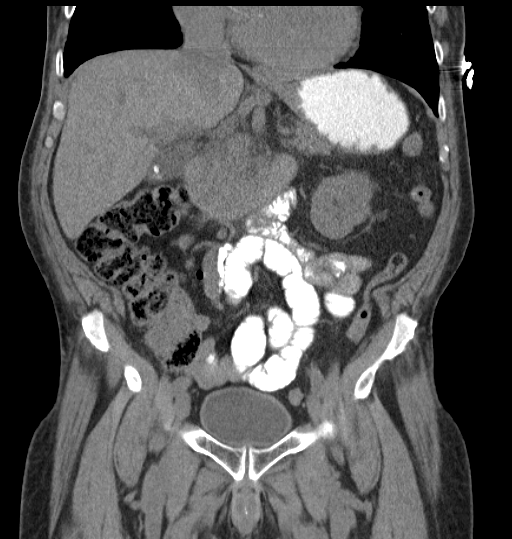
[im 57/102  soft-tissue]
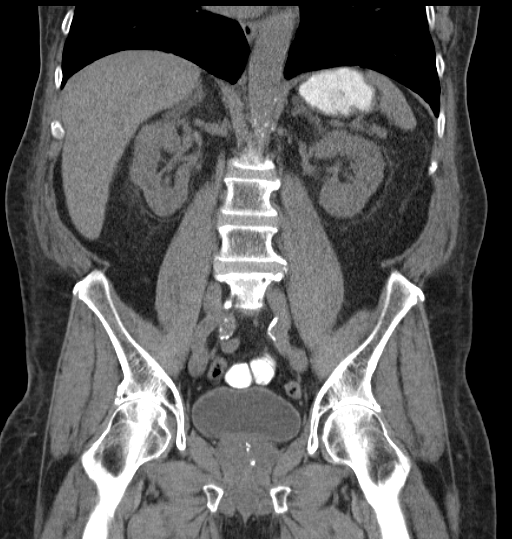

[16 of 46 positions shown; findings below may reference images not displayed]

FINDINGS: Lower chest: Small focal parenchymal opacity in the posterior
lateral right lower lobe. Minimal pleural thickening bilaterally.
Punctate granuloma in the right lower lobe. Multi chamber
cardiomegaly. Pacemaker wires are partially included.

Liver: Scattered hypodense lesions in the liver, majority sub
centimeter and too small to accurately characterize, largest in the
left lobe measures 3.7 cm.

Hepatobiliary: Layering gallstones within the gallbladder.
Gallbladder physiologically distended. No pericholecystic
inflammation. No evidence of biliary dilatation, however common bile
duct is suboptimally defined.

Pancreas: No ductal dilatation or surrounding inflammation.

Spleen: No focal lesion.

Adrenal glands: No nodule.

Kidneys: No hydronephrosis or urolithiasis. Multiple bilateral renal
lesions, majority small in size and representing simple cysts.
However, there is a hyperdense 9 mm lesion in the medial mid left
kidney, with a complex appearing 2 cm hypodense lesion in the left
mid upper pole.

Stomach/Bowel: Stomach physiologically distended. There are no
dilated or thickened small bowel loops. Small volume of stool
throughout the colon without colonic wall thickening. The appendix
is normal.

Vascular/Lymphatic: No retroperitoneal adenopathy. Abdominal aorta
is normal in caliber. Moderate atherosclerosis and tortuosity of the
abdominal aorta without aneurysm.

Reproductive: Prostate gland normal in size, central prostatic
calcification.

Bladder: Physiologically distended, no wall thickening.

Other: No free air, free fluid, or intra-abdominal fluid collection.
Small fat containing umbilical hernia.

Musculoskeletal: There are no acute or suspicious osseous
abnormalities. Degenerative change in the lumbar spine.
IMPRESSION: 1. Small peripheral consolidation in the right lung base, this abuts
the pleura and may be the cause of patient's right-sided pain. This
likely represent small focal pneumonia. Chest radiograph correlation
may be helpful, particularly for follow-up which is recommended in
3-4 weeks.
2. No acute intra-abdominal/pelvic abnormality.
3. Bilateral renal lesions, incompletely characterized without
contrast. Majority of these are small and appear to represent cysts.
Question of complex cystic lesion in the left kidney, corresponding
to that seen on prior ultrasound. Additionally there is a hyperdense
subcentimeter lesion in the left kidney. Nonemergent renal protocol
MRI with and with contrast is recommended for further
characterization.

## 2017-03-15 IMAGING — CR DG CHEST 1V PORT
1 series · 1 of 1 positions shown · non-contrast
Comparison: 12/04/2014

CLINICAL DATA: Shortness of breath, wheezing and chest tightness
today.

EXAM:
PORTABLE CHEST 1 VIEW

[ap portable]
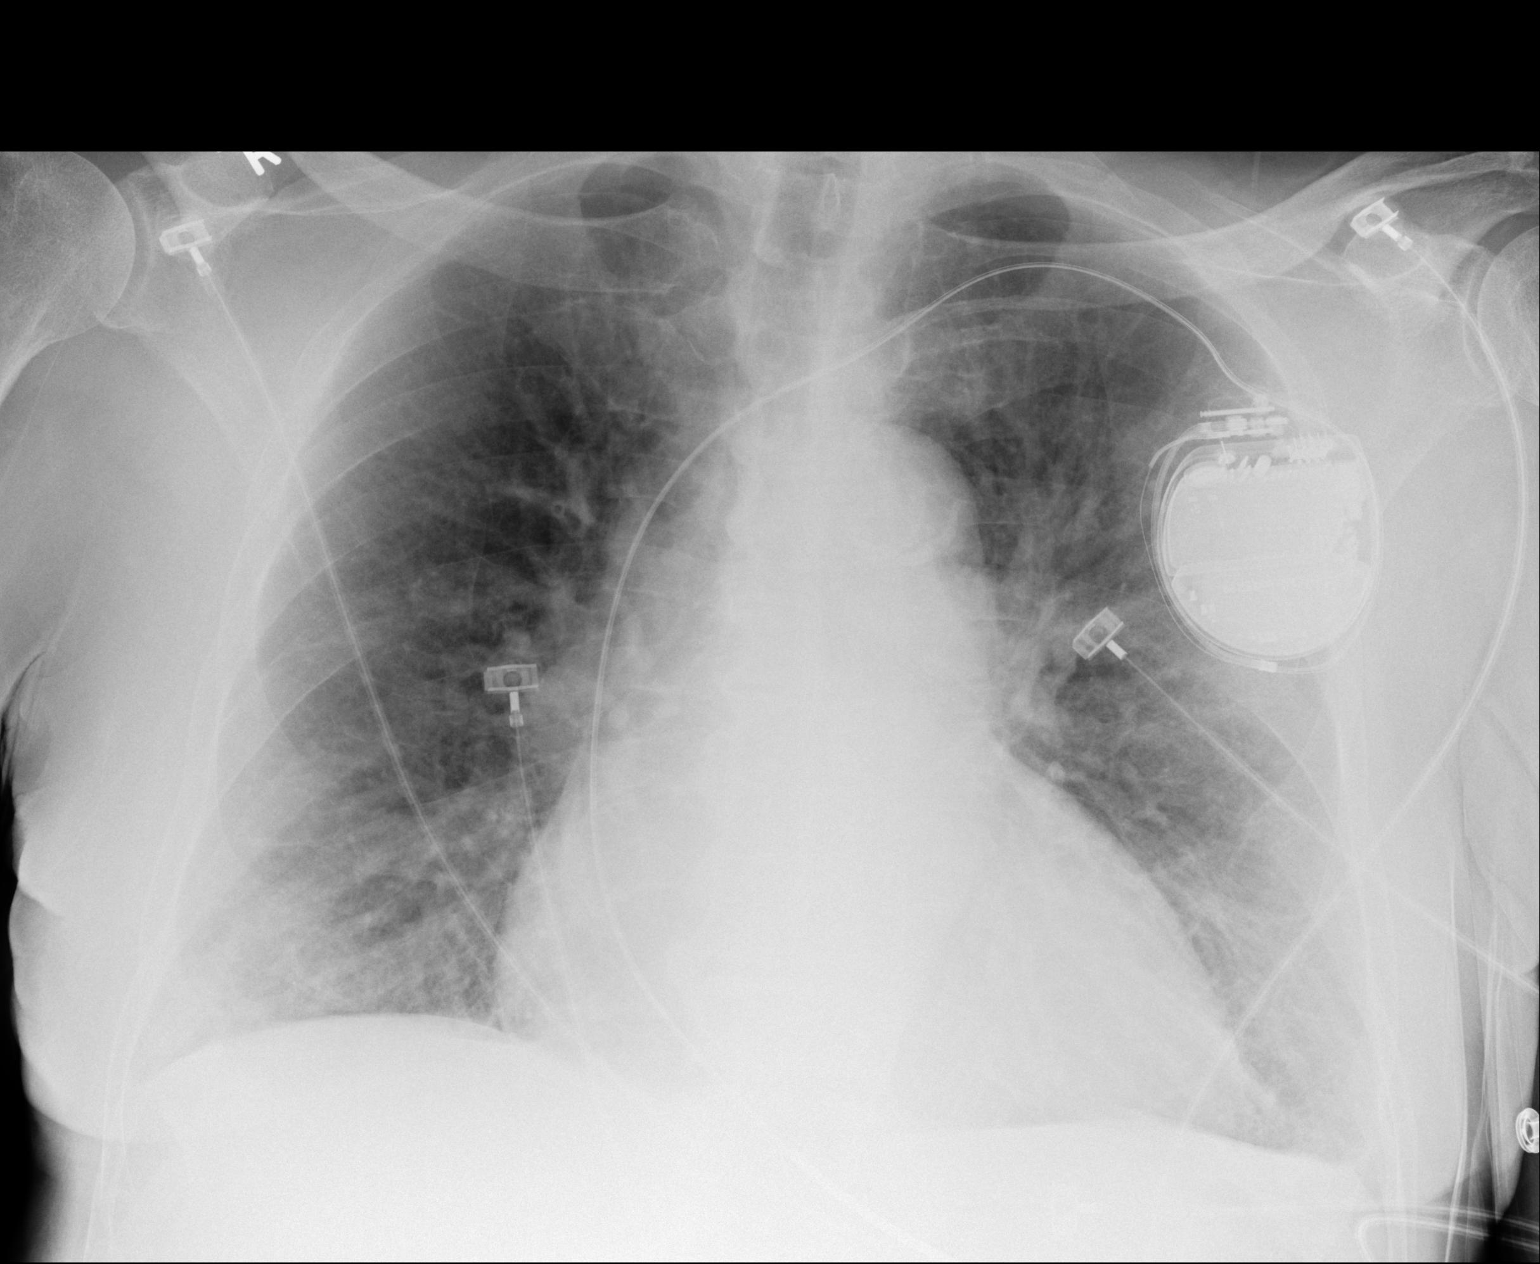

[1 of 1 positions shown; findings below may reference images not displayed]

FINDINGS: Single lead left-sided pacemaker remains in place. Cardiomegaly,
possibly progressed from prior. Tortuosity of the thoracic aorta
again seen. Bilateral perihilar alveolar opacities concerning for
pulmonary edema. No large pleural effusion. No confluent airspace
disease. No pneumothorax.
IMPRESSION: Pulmonary edema. Cardiomegaly, mildly progressed from prior.
Findings most consistent with CHF.

## 2018-05-15 ENCOUNTER — Other Ambulatory Visit: Payer: Self-pay | Admitting: *Deleted

## 2018-05-15 NOTE — Patient Outreach (Signed)
Chart entry per Eleanor Rivers to close program.  Adoria Kawamoto RNC, BSN [facility] Community Care Coordinator [phone]
